# Patient Record
Sex: Female | Born: 1945
Health system: Southern US, Community
[De-identification: ages and names within clinical notes are randomized; demographics above are authoritative.]

## PROBLEM LIST (undated history)

## (undated) DIAGNOSIS — T83022A Displacement of nephrostomy catheter, initial encounter: Secondary | ICD-10-CM

## (undated) DIAGNOSIS — C679 Malignant neoplasm of bladder, unspecified: Secondary | ICD-10-CM

## (undated) DIAGNOSIS — T8029XA Infection following other infusion, transfusion and therapeutic injection, initial encounter: Secondary | ICD-10-CM

## (undated) DIAGNOSIS — N1330 Unspecified hydronephrosis: Secondary | ICD-10-CM

## (undated) DIAGNOSIS — N135 Crossing vessel and stricture of ureter without hydronephrosis: Secondary | ICD-10-CM

## (undated) DIAGNOSIS — Z01818 Encounter for other preprocedural examination: Secondary | ICD-10-CM

## (undated) DIAGNOSIS — L0291 Cutaneous abscess, unspecified: Secondary | ICD-10-CM

## (undated) DIAGNOSIS — C675 Malignant neoplasm of bladder neck: Secondary | ICD-10-CM

## (undated) DIAGNOSIS — R569 Unspecified convulsions: Secondary | ICD-10-CM

## (undated) DIAGNOSIS — T783XXA Angioneurotic edema, initial encounter: Secondary | ICD-10-CM

## (undated) DIAGNOSIS — Z136 Encounter for screening for cardiovascular disorders: Secondary | ICD-10-CM

## (undated) DIAGNOSIS — F329 Major depressive disorder, single episode, unspecified: Secondary | ICD-10-CM

## (undated) DIAGNOSIS — F32A Depression, unspecified: Secondary | ICD-10-CM

## (undated) DIAGNOSIS — N83209 Unspecified ovarian cyst, unspecified side: Secondary | ICD-10-CM

## (undated) DIAGNOSIS — G4700 Insomnia, unspecified: Secondary | ICD-10-CM

## (undated) DIAGNOSIS — I639 Cerebral infarction, unspecified: Secondary | ICD-10-CM

## (undated) DIAGNOSIS — C8510 Unspecified B-cell lymphoma, unspecified site: Secondary | ICD-10-CM

## (undated) DIAGNOSIS — E785 Hyperlipidemia, unspecified: Secondary | ICD-10-CM

## (undated) DIAGNOSIS — R55 Syncope and collapse: Secondary | ICD-10-CM

## (undated) DIAGNOSIS — R32 Unspecified urinary incontinence: Secondary | ICD-10-CM

## (undated) DIAGNOSIS — I10 Essential (primary) hypertension: Secondary | ICD-10-CM

## (undated) DIAGNOSIS — R42 Dizziness and giddiness: Secondary | ICD-10-CM

## (undated) HISTORY — DX: Syncope and collapse: R55

## (undated) HISTORY — DX: Encounter for screening for cardiovascular disorders: Z13.6

## (undated) HISTORY — DX: Insomnia, unspecified: G47.00

## (undated) HISTORY — DX: Unspecified urinary incontinence: R32

## (undated) HISTORY — DX: Angioneurotic edema, initial encounter: T78.3XXA

## (undated) HISTORY — DX: Essential (primary) hypertension: I10

## (undated) HISTORY — PX: ABDOMINAL HYSTERECTOMY: SHX81

## (undated) HISTORY — DX: Hyperlipidemia, unspecified: E78.5

## (undated) HISTORY — DX: Cerebral infarction, unspecified: I63.9

## (undated) HISTORY — PX: VESICOVAGINAL FISTULA CLOSURE W/ TAH: SUR271

## (undated) HISTORY — DX: Unspecified ovarian cyst, unspecified side: N83.209

---

## 2011-02-23 DIAGNOSIS — Z136 Encounter for screening for cardiovascular disorders: Secondary | ICD-10-CM

## 2011-02-23 HISTORY — DX: Encounter for screening for cardiovascular disorders: Z13.6

## 2011-04-14 ENCOUNTER — Telehealth: Payer: Self-pay | Admitting: *Deleted

## 2011-04-14 NOTE — Telephone Encounter (Signed)
LMTCB

## 2011-04-14 NOTE — Telephone Encounter (Signed)
Pt c/o SOB x 1 year, Dr. Sherryll Burger is her PCP and she request to be seen by one of our providers. Pt transferred to [phone] to be scheduled for new pt consult. I asked pt is [place] or [place] better and she stated she can do either.

## 2011-04-24 ENCOUNTER — Encounter: Payer: Self-pay | Admitting: Critical Care Medicine

## 2011-04-24 ENCOUNTER — Telehealth: Payer: Self-pay | Admitting: Critical Care Medicine

## 2011-04-24 ENCOUNTER — Ambulatory Visit (INDEPENDENT_AMBULATORY_CARE_PROVIDER_SITE_OTHER): Payer: Medicare PPO | Admitting: Critical Care Medicine

## 2011-04-24 DIAGNOSIS — I10 Essential (primary) hypertension: Secondary | ICD-10-CM

## 2011-04-24 DIAGNOSIS — R0600 Dyspnea, unspecified: Secondary | ICD-10-CM | POA: Insufficient documentation

## 2011-04-24 DIAGNOSIS — R55 Syncope and collapse: Secondary | ICD-10-CM | POA: Insufficient documentation

## 2011-04-24 DIAGNOSIS — R0989 Other specified symptoms and signs involving the circulatory and respiratory systems: Secondary | ICD-10-CM

## 2011-04-24 NOTE — Progress Notes (Signed)
Subjective:    Patient ID: Molly Patel, female    DOB: 1946-05-08, 65 y.o.   MRN: [id number]  HPI Comments: Main issue is dyspnea with exertion and syncope,  No cough   Shortness of Breath This is a chronic problem. The current episode started more than 1 year ago (Started end of August 2011). The problem occurs every several days (worse with heat, exertion or standing only). The problem has been gradually worsening. Associated symptoms include leg swelling, orthopnea and syncope. Pertinent negatives include no abdominal pain, chest pain, ear pain, fever, headaches, hemoptysis, leg pain, neck pain, PND, rash, rhinorrhea, sore throat, sputum production, vomiting or wheezing. The symptoms are aggravated by weather changes, any activity and lying flat (weather: esp hot days). Associated symptoms comments: Dizziness, episodes of syncope three different times.. She has tried nothing for the symptoms. There is no history of allergies, aspirin allergies, asthma, bronchiolitis, CAD, chronic lung disease, COPD, DVT, a heart failure, PE or pneumonia.   65 y.o.WF referred for dyspnea  Past Medical History  Diagnosis Date  . Hypertension      Family History  Problem Relation Age of Onset  . Asthma Grandchild   . Arthritis Sister   . Colon cancer Brother      History   Social History  . Marital Status: Divorced    Spouse Name: N/A    Number of Children: 2  . Years of Education: N/A   Occupational History  . Retired     [employer]   Social History Main Topics  . Smoking status: Former Smoker -- 7 years    Types: Cigarettes    Quit date: 08/25/1985  . Smokeless tobacco: Never Used  . Alcohol Use: No  . Drug Use: No  . Sexually Active: Not on file   Other Topics Concern  . Not on file   Social History Narrative  . No narrative on file     No Known Allergies   No outpatient prescriptions prior to visit.       Review of Systems  Constitutional: Positive for  activity change. Negative for fever, chills, diaphoresis, appetite change, fatigue and unexpected weight change.  HENT: Negative for hearing loss, ear pain, nosebleeds, congestion, sore throat, facial swelling, rhinorrhea, sneezing, mouth sores, trouble swallowing, neck pain, neck stiffness, dental problem, voice change, postnasal drip, sinus pressure, tinnitus and ear discharge.   Eyes: Positive for itching. Negative for photophobia, discharge and visual disturbance.  Respiratory: Positive for shortness of breath. Negative for apnea, cough, hemoptysis, sputum production, choking, chest tightness, wheezing and stridor.   Cardiovascular: Positive for orthopnea, leg swelling and syncope. Negative for chest pain, palpitations and PND.  Gastrointestinal: Negative for nausea, vomiting, abdominal pain, constipation, blood in stool and abdominal distention.  Genitourinary: Negative for dysuria, urgency, frequency, hematuria, flank pain, decreased urine volume and difficulty urinating.  Musculoskeletal: Positive for myalgias and gait problem. Negative for back pain, joint swelling and arthralgias.  Skin: Positive for pallor. Negative for color change and rash.  Neurological: Positive for syncope and light-headedness. Negative for dizziness, tremors, seizures, speech difficulty, weakness, numbness and headaches.  Hematological: Negative for adenopathy. Does not bruise/bleed easily.  Psychiatric/Behavioral: Positive for confusion and sleep disturbance. Negative for agitation. The patient is not nervous/anxious.        Objective:   Physical Exam  Filed Vitals:   04/24/11 1149  BP: 132/80  Pulse: 80  Temp: 97.7 F (36.5 C)  TempSrc: Oral  Height: 5' 6.5" (1.689  m)  Weight: 201 lb (91.173 kg)  SpO2: 98%    Gen: Pleasant, well-nourished, in no distress,  normal affect  ENT: No lesions,  mouth clear,  oropharynx clear, no postnasal drip  Neck: No JVD, no TMG, no carotid bruits  Lungs: No use of  accessory muscles, no dullness to percussion, clear without rales or rhonchi  Cardiovascular: RRR, heart sounds normal, no murmur or gallops, no peripheral edema  Abdomen: soft and NT, no HSM,  BS normal  Musculoskeletal: No deformities, no cyanosis or clubbing  Neuro: alert, non focal  Skin: Warm, no lesions or rashes   Recent Labs 6/12: Cr 0.78  K 3.9  TSH 3.74 LFTs normal Hgb 13  WBC 5.8    Assessment & Plan:   Dyspnea Dyspnea ? Unclear etiology.   No recent CXR or PFTs Plan Obtain CXR(pt left without this, will schedule) Full PFTs with 6 min walk No med changes for now Need complete herbal medicine list     Updated Medication List Outpatient Encounter Prescriptions as of 04/24/2011  Medication Sig Dispense Refill  . atenolol (TENORMIN) 50 MG tablet Take 1 tablet by mouth Daily.      . calcium-vitamin D (OSCAL WITH D) 250-125 MG-UNIT per tablet Take 2 tablets by mouth daily.        Marland Kitchen Fesoterodine Fumarate (TOVIAZ) 8 MG TB24 Take 1 tablet by mouth daily.        . hydrochlorothiazide 25 MG tablet Take 25 mg by mouth daily.        . Multiple Vitamin (MULTIVITAMIN) tablet Take 1 tablet by mouth daily.        . traZODone (DESYREL) 50 MG tablet Take 50 mg by mouth at bedtime.        . vitamin B-12 (CYANOCOBALAMIN) 100 MCG tablet Take 100 mcg by mouth daily.        . Vitamin D, Ergocalciferol, (DRISDOL) 50000 UNITS CAPS Take 50,000 Units by mouth every 7 (seven) days.

## 2011-04-24 NOTE — Patient Instructions (Signed)
Call back with your list of supplements Will obtain records from Dr Sherryll Burger No medication changes Full pulmonary function tests , and 6 minute walk  Return 3 weeks , I will call with results

## 2011-04-24 NOTE — Assessment & Plan Note (Signed)
Dyspnea ? Unclear etiology.   No recent CXR or PFTs Plan Obtain CXR(pt left without this, will schedule) Full PFTs with 6 min walk No med changes for now Need complete herbal medicine list

## 2011-04-24 NOTE — Telephone Encounter (Signed)
Pt called to give an update on her otc meds. These meds have been added to the list

## 2011-04-25 ENCOUNTER — Telehealth: Payer: Self-pay | Admitting: *Deleted

## 2011-04-25 DIAGNOSIS — R0600 Dyspnea, unspecified: Secondary | ICD-10-CM

## 2011-04-25 NOTE — Telephone Encounter (Signed)
Message copied by Gweneth Dimitri D on Fri Apr 25, 2011 12:14 PM ------      Message from: Shan Levans E      Created: Thu Apr 24, 2011  2:51 PM       When this pt comes for pfts, she needs a cxr.  No record of pt ever having a cxr

## 2011-04-25 NOTE — Telephone Encounter (Signed)
Called, spoke with pt.  She is aware she will need to have CXR on Sept 11 when she is at the [place] office for PFT and SMW.  She verbalized understanding of this.  Order has been placed.  I also included a reminder in both the PFT and SMW appt to remind pt to go down for the CXR.

## 2011-05-06 ENCOUNTER — Ambulatory Visit (INDEPENDENT_AMBULATORY_CARE_PROVIDER_SITE_OTHER): Payer: Medicare PPO | Admitting: Critical Care Medicine

## 2011-05-06 ENCOUNTER — Ambulatory Visit (INDEPENDENT_AMBULATORY_CARE_PROVIDER_SITE_OTHER)
Admission: RE | Admit: 2011-05-06 | Discharge: 2011-05-06 | Disposition: A | Discharge status: Home / Self Care | Payer: Medicare PPO | Source: Ambulatory Visit | Attending: Critical Care Medicine | Admitting: Critical Care Medicine

## 2011-05-06 DIAGNOSIS — R0609 Other forms of dyspnea: Secondary | ICD-10-CM

## 2011-05-06 DIAGNOSIS — R0600 Dyspnea, unspecified: Secondary | ICD-10-CM

## 2011-05-06 DIAGNOSIS — R0989 Other specified symptoms and signs involving the circulatory and respiratory systems: Secondary | ICD-10-CM

## 2011-05-06 LAB — PULMONARY FUNCTION TEST

## 2011-05-06 NOTE — Progress Notes (Signed)
PFT done today. 

## 2011-05-15 ENCOUNTER — Encounter: Payer: Self-pay | Admitting: Critical Care Medicine

## 2011-05-15 ENCOUNTER — Ambulatory Visit (INDEPENDENT_AMBULATORY_CARE_PROVIDER_SITE_OTHER): Payer: Medicare PPO | Admitting: Critical Care Medicine

## 2011-05-15 VITALS — BP 130/70 | HR 68 | Temp 97.7°F | Ht 66.5 in | Wt 202.5 lb

## 2011-05-15 DIAGNOSIS — R0609 Other forms of dyspnea: Secondary | ICD-10-CM

## 2011-05-15 DIAGNOSIS — R0600 Dyspnea, unspecified: Secondary | ICD-10-CM

## 2011-05-15 NOTE — Assessment & Plan Note (Signed)
Normal PFT Normal CXR Echo : NL LVEF Dyspnea likely on basis of deconditioning  Plan No further pulmonary workup or treatment Return PRN

## 2011-05-15 NOTE — Progress Notes (Unsigned)
PFTs from 05/06/2011 entered.

## 2011-05-15 NOTE — Patient Instructions (Signed)
No change in medications. Return as needed 

## 2011-05-15 NOTE — Progress Notes (Signed)
Subjective:    Patient ID: Molly Patel, female    DOB: [birth year], 65 y.o.   MRN: [id number]  HPI Comments: Main issue is dyspnea with exertion and syncope,  No cough   Shortness of Breath This is a chronic problem. The current episode started more than 1 year ago (Started end of August 2011). The problem occurs rarely (worse with heat, exertion or standing only). The problem has been rapidly improving. Pertinent negatives include no abdominal pain, chest pain, ear pain, fever, headaches, hemoptysis, leg pain, leg swelling, neck pain, orthopnea, PND, rash, rhinorrhea, sore throat, sputum production, syncope, vomiting or wheezing. The symptoms are aggravated by weather changes, any activity and lying flat (weather: esp hot days). She has tried nothing for the symptoms. There is no history of allergies, aspirin allergies, asthma, bronchiolitis, CAD, chronic lung disease, COPD, DVT, a heart failure, PE or pneumonia.   65 y.o.WF referred for dyspnea  05/15/2011 Patient has improved with less dyspnea since humidity and heat have decreased. Pulmonary functions are reviewed and are normal.  Chest x-ray is normal, echocardiogram is normal.  Past Medical History  Diagnosis Date  . Hypertension      Family History  Problem Relation Age of Onset  . Asthma Grandchild   . Arthritis Sister   . Colon cancer Brother      History   Social History  . Marital Status: Divorced    Spouse Name: N/A    Number of Children: 2  . Years of Education: N/A   Occupational History  . Retired     [employer]   Social History Main Topics  . Smoking status: Former Smoker -- 7 years    Types: Cigarettes    Quit date: 08/25/1985  . Smokeless tobacco: Never Used  . Alcohol Use: No  . Drug Use: No  . Sexually Active: Not on file   Other Topics Concern  . Not on file   Social History Narrative  . No narrative on file     No Known Allergies   Outpatient Prescriptions Prior to Visit    Medication Sig Dispense Refill  . atenolol (TENORMIN) 50 MG tablet Take 1 tablet by mouth Daily.      Marland Kitchen Fesoterodine Fumarate (TOVIAZ) 8 MG TB24 Take 1 tablet by mouth daily.        . hydrochlorothiazide 25 MG tablet Take 25 mg by mouth daily.        . calcium-vitamin D (OSCAL WITH D) 250-125 MG-UNIT per tablet Take 2 tablets by mouth daily.        . Cholecalciferol (VITAMIN D3) 1000 UNITS CAPS 3 times a week       . fish oil-omega-3 fatty acids 1000 MG capsule 2 tablets 3 times a week       . Multiple Vitamin (MULTIVITAMIN) tablet Take 1 tablet by mouth daily.        . Pyridoxine HCl (B-6 PO) 2 tablets a week       . traZODone (DESYREL) 50 MG tablet Take 50 mg by mouth at bedtime.        . vitamin B-12 (CYANOCOBALAMIN) 100 MCG tablet Take 100 mcg by mouth daily.        . vitamin C (ASCORBIC ACID) 500 MG tablet daily. 3 times a week            Review of Systems  Constitutional: Positive for activity change. Negative for fever, chills, diaphoresis, appetite change, fatigue and unexpected weight change.  HENT: Negative for hearing loss, ear pain, nosebleeds, congestion, sore throat, facial swelling, rhinorrhea, sneezing, mouth sores, trouble swallowing, neck pain, neck stiffness, dental problem, voice change, postnasal drip, sinus pressure, tinnitus and ear discharge.   Eyes: Positive for itching. Negative for photophobia, discharge and visual disturbance.  Respiratory: Positive for shortness of breath. Negative for apnea, cough, hemoptysis, sputum production, choking, chest tightness, wheezing and stridor.   Cardiovascular: Negative for chest pain, palpitations, orthopnea, leg swelling, syncope and PND.  Gastrointestinal: Negative for nausea, vomiting, abdominal pain, constipation, blood in stool and abdominal distention.  Genitourinary: Negative for dysuria, urgency, frequency, hematuria, flank pain, decreased urine volume and difficulty urinating.  Musculoskeletal: Positive for myalgias  and gait problem. Negative for back pain, joint swelling and arthralgias.  Skin: Positive for pallor. Negative for color change and rash.  Neurological: Positive for syncope and light-headedness. Negative for dizziness, tremors, seizures, speech difficulty, weakness, numbness and headaches.  Hematological: Negative for adenopathy. Does not bruise/bleed easily.  Psychiatric/Behavioral: Positive for confusion and sleep disturbance. Negative for agitation. The patient is not nervous/anxious.        Objective:   Physical Exam   Filed Vitals:   05/15/11 1147  BP: 130/70  Pulse: 68  Temp: 97.7 F (36.5 C)  TempSrc: Oral  Height: 5' 6.5" (1.689 m)  Weight: 202 lb 8 oz (91.853 kg)  SpO2: 99%    Gen: Pleasant, well-nourished, in no distress,  normal affect  ENT: No lesions,  mouth clear,  oropharynx clear, no postnasal drip  Neck: No JVD, no TMG, no carotid bruits  Lungs: No use of accessory muscles, no dullness to percussion, clear without rales or rhonchi  Cardiovascular: RRR, heart sounds normal, no murmur or gallops, no peripheral edema  Abdomen: soft and NT, no HSM,  BS normal  Musculoskeletal: No deformities, no cyanosis or clubbing  Neuro: alert, non focal  Skin: Warm, no lesions or rashes  8/31 CXR IMPRESSION:  No active cardiopulmonary disease.  9/11 6 min walk  98% RA Recent Labs 6/12: Cr 0.78  K 3.9  TSH 3.74 LFTs normal Hgb 13  WBC 5.8    Assessment & Plan:   Dyspnea Normal PFT Normal CXR Echo : NL LVEF Dyspnea likely on basis of deconditioning  Plan No further pulmonary workup or treatment Return PRN     I did discuss with the patient her elevated BMI of greater than 32 and suggested that she pursue a plan of weight loss with dietary change Updated Medication List Outpatient Encounter Prescriptions as of 05/15/2011  Medication Sig Dispense Refill  . atenolol (TENORMIN) 50 MG tablet Take 1 tablet by mouth Daily.      Marland Kitchen Fesoterodine  Fumarate (TOVIAZ) 8 MG TB24 Take 1 tablet by mouth daily.        . hydrochlorothiazide 25 MG tablet Take 25 mg by mouth daily.        Marland Kitchen DISCONTD: calcium-vitamin D (OSCAL WITH D) 250-125 MG-UNIT per tablet Take 2 tablets by mouth daily.        Marland Kitchen DISCONTD: Cholecalciferol (VITAMIN D3) 1000 UNITS CAPS 3 times a week       . DISCONTD: fish oil-omega-3 fatty acids 1000 MG capsule 2 tablets 3 times a week       . DISCONTD: Multiple Vitamin (MULTIVITAMIN) tablet Take 1 tablet by mouth daily.        Marland Kitchen DISCONTD: Pyridoxine HCl (B-6 PO) 2 tablets a week       . DISCONTD: traZODone (  DESYREL) 50 MG tablet Take 50 mg by mouth at bedtime.        Marland Kitchen DISCONTD: vitamin B-12 (CYANOCOBALAMIN) 100 MCG tablet Take 100 mcg by mouth daily.        Marland Kitchen DISCONTD: vitamin C (ASCORBIC ACID) 500 MG tablet daily. 3 times a week

## 2011-09-05 ENCOUNTER — Encounter: Payer: Self-pay | Admitting: Family Medicine

## 2011-09-05 ENCOUNTER — Ambulatory Visit (INDEPENDENT_AMBULATORY_CARE_PROVIDER_SITE_OTHER): Payer: Medicare PPO | Admitting: Family Medicine

## 2011-09-05 VITALS — BP 142/78 | HR 74 | Temp 97.2°F | Resp 16 | Ht 67.0 in | Wt 207.0 lb

## 2011-09-05 DIAGNOSIS — R609 Edema, unspecified: Secondary | ICD-10-CM

## 2011-09-05 DIAGNOSIS — E669 Obesity, unspecified: Secondary | ICD-10-CM

## 2011-09-05 DIAGNOSIS — I10 Essential (primary) hypertension: Secondary | ICD-10-CM

## 2011-09-05 DIAGNOSIS — R600 Localized edema: Secondary | ICD-10-CM | POA: Insufficient documentation

## 2011-09-05 DIAGNOSIS — R42 Dizziness and giddiness: Secondary | ICD-10-CM | POA: Insufficient documentation

## 2011-09-05 MED ORDER — ATENOLOL 50 MG PO TABS: 50.0000 mg | ORAL_TABLET | Freq: Every day | ORAL | Status: DC

## 2011-09-05 MED ORDER — HYDROCHLOROTHIAZIDE 25 MG PO TABS: 25.0000 mg | ORAL_TABLET | Freq: Every day | ORAL | Status: DC

## 2011-09-05 NOTE — Progress Notes (Signed)
  Subjective:    Patient ID: Molly Patel, female    DOB: 1946-06-15, 66 y.o.   MRN: [id number]  HPI  Dr. Sherryll Burger- previous PCP Patient here to establish care   HTN- long standing history, takes HCTZ, Atenolol. Recently had labs performed on her previous PCP at the [facility].   SOB- patient seen and evaluated for shortness of breath. Has had pulmonary function tests which were normal according to the chart. Pulmonology note reviewed. She feels her breathing is much better at this time.  Dizziness- patient has been seen and had workup for dizziness as well. She does not know the diagnosis and unclear of specific separate testing. She does not have her records with her today.   Leg swelling- history of LE edema, currently on HCTZ.  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 Jenelle Mages [facility]- Dr. Mayford Knife- overdue  Review of Systems   GEN- denies fatigue, fever, weight loss,weakness, recent illness HEENT- denies eye drainage, change in vision, nasal discharge, CVS- denies chest pain, palpitations RESP- denies SOB, cough, wheeze ABD- denies N/V, change in stools, abd pain,+constipation GU- denies dysuria, hematuria, dribbling, +incontinence MSK-+joint pain, muscle aches, injury Neuro- denies headache, syncope, seizure activity      Objective:   Physical Exam GEN- NAD, alert and oriented x3 HEENT- PERRL, EOMI, non injected sclera, pink conjunctiva, MMM, oropharynx clear, fair dentition Neck- Supple, no thryomegaly, no carotid bruit CVS- RRR, no murmur RESP-CTAB ABD- NABS, soft, NT, ND EXT- pedal edema Pulses- Radial, DP- 2+        Assessment & Plan:

## 2011-09-05 NOTE — Assessment & Plan Note (Signed)
Encouraged weight loss and exercise.  

## 2011-09-05 NOTE — Patient Instructions (Signed)
Start taking Calcium ( 1200mg ) and Vit D (800IU) Continue your blood pressure medications Take your Baby Aspirin daily Take the multivitamin daily   We will get the records from your previous doctors  Next visit in 4 weeks

## 2011-09-05 NOTE — Assessment & Plan Note (Signed)
Slightly elevated blood pressure today. I will obtain her records. She will continue her current medications.

## 2011-09-05 NOTE — Assessment & Plan Note (Signed)
Lower extremity edema, likely secondary to venous stasis. Patient will continue HCTZ at this time. Will follow up to see she has compression hose if this gets worse

## 2011-09-05 NOTE — Assessment & Plan Note (Signed)
Patient evidently has had extensive workup for this. Review the chart also shows she has had syncope in the past.: I need to take a closer look into her medical records as she cannot explain any test results for the specialist she has seen.

## 2011-09-21 ENCOUNTER — Encounter: Payer: Self-pay | Admitting: Family Medicine

## 2011-09-22 ENCOUNTER — Encounter: Payer: Self-pay | Admitting: Family Medicine

## 2011-10-06 ENCOUNTER — Encounter: Payer: Self-pay | Admitting: Family Medicine

## 2011-10-06 ENCOUNTER — Ambulatory Visit (INDEPENDENT_AMBULATORY_CARE_PROVIDER_SITE_OTHER): Payer: Medicare PPO | Admitting: Family Medicine

## 2011-10-06 VITALS — BP 130/72 | HR 71 | Resp 18 | Ht 67.0 in | Wt 209.1 lb

## 2011-10-06 DIAGNOSIS — R609 Edema, unspecified: Secondary | ICD-10-CM

## 2011-10-06 DIAGNOSIS — I10 Essential (primary) hypertension: Secondary | ICD-10-CM

## 2011-10-06 DIAGNOSIS — E669 Obesity, unspecified: Secondary | ICD-10-CM

## 2011-10-06 DIAGNOSIS — M25569 Pain in unspecified knee: Secondary | ICD-10-CM

## 2011-10-06 NOTE — Assessment & Plan Note (Signed)
Edema improved per pt, continue thiazide diuretic

## 2011-10-06 NOTE — Assessment & Plan Note (Signed)
Blood pressure improved, continue BB and HCTZ. Reviewed last set of labs, will repeat at next visit

## 2011-10-06 NOTE — Assessment & Plan Note (Signed)
Continue to encourage exercise and conditoning

## 2011-10-06 NOTE — Assessment & Plan Note (Signed)
Anterior knee pain, no red flags, suggest OA, pt not having pain and wants to wait on any intervention. If this continues to bother her, obtain x-ray, told to use Tylenol prn

## 2011-10-06 NOTE — Progress Notes (Signed)
  Subjective:    Patient ID: Molly Patel, female    DOB: [birth year], 66 y.o.   MRN: [id number]  HPI Patient here to followup on hypertension. Has been tolerating her HCTZ and atenolol. She asked about switching him to the morning because he keeps her up at night. Denies any chest pain, shortness of breath. She did note she had flulike symptoms a few weeks ago but self-medicating at home and is doing well.  Knee weakness- patient states she's had knee weakness since she's had the flu. She denies any actual pain but feels a week in the anterior knee. She denies swelling, locking, catching. No previous history of knee pain, no recent fall or injury. She has not had to take any medication for this.  Leg edema- she feels her swelling has improved.  Review of Systems   GEN- denies fatigue, fever, weight loss,weakness, recent illness HEENT- denies eye drainage, change in vision, nasal discharge, CVS- denies chest pain, palpitations RESP- denies SOB, cough, wheeze MSK-+joint pain, muscle aches, injury Neuro- denies headache, syncope, seizure activity     Objective:   Physical Exam GEN- NAD, alert and oriented x3 CVS- RRR, no murmur RESP-CTAB EXT- pedal edema Pulses- Radial, DP- 2+ MSK- normal ROM bilateral knee, no deformity on inspection, no effusion, minimal ttp anterior knee, ligaments in tact       Assessment & Plan:   Pt declines immunizations

## 2011-10-06 NOTE — Patient Instructions (Signed)
Next visit in 5 months for routine physical Please set her up for a morning appointment Do not eat after midnight before visit Continue your current medications If your knees continue to bother you please call and we will get an x-ray

## 2011-11-24 LAB — CBC W/O DIFF
HCT: 42.8 % (ref 35.8–46.3)
HGB: 13.8 g/dL (ref 11.7–15.4)
MCH: 29.2 PG (ref 26.1–32.9)
MCHC: 32.2 g/dL (ref 31.4–35.0)
MCV: 90.5 FL (ref 79.6–97.8)
MPV: 10.4 FL — ABNORMAL LOW (ref 10.8–14.1)
PLATELET: 275 10*3/uL (ref 150–450)
RBC: 4.73 M/uL (ref 4.05–5.25)
RDW: 14.9 % — ABNORMAL HIGH (ref 11.9–14.6)
WBC: 8 10*3/uL (ref 4.3–11.1)

## 2011-11-24 LAB — URINALYSIS W/O MICRO
Bilirubin: NEGATIVE
Glucose: NEGATIVE MG/DL
Ketone: NEGATIVE MG/DL
Leukocyte Esterase: NEGATIVE
Nitrites: NEGATIVE
Specific gravity: 1.015 (ref 1.001–1.023)
Urobilinogen: 0.2 EU/DL (ref 0.2–1.0)
pH (UA): 7 (ref 5.0–9.0)

## 2011-11-24 LAB — METABOLIC PANEL, BASIC
Anion gap: 8 mmol/L (ref 7–16)
BUN: 18 MG/DL (ref 8–23)
CO2: 30 MMOL/L (ref 21–32)
Calcium: 8.9 MG/DL (ref 8.3–10.4)
Chloride: 100 MMOL/L (ref 98–107)
Creatinine: 0.65 MG/DL (ref 0.6–1.0)
GFR est AA: >60 mL/min/{1.73_m2} (ref >60)
GFR est non-AA: >60 mL/min/{1.73_m2} (ref >60)
Glucose: 111 MG/DL — ABNORMAL HIGH (ref 65–100)
Potassium: 4.2 MMOL/L (ref 3.5–5.1)
Sodium: 138 MMOL/L (ref 136–145)

## 2011-11-24 LAB — URINE MICROSCOPIC
Casts: 0 /LPF
Crystals, urine: 0 /LPF

## 2011-11-24 NOTE — Other (Signed)
Pt verbalizes understanding of all instructions, including NPO after midnight the day before, place, medications to take DOS: neurontin, ultram prn, pyridium prn, thyroid, and to continue regular medications up until DOS.  Pt was informed they will be notified of the time of arrival the afternoon prior to their surgery date.  Teaching sheets given and reviewed on blood administration, pain management, smoking cessation, importance of hand hygiene & preventing infection at home.   Pt knows to have ride present during surgery and to check home phone for any changes in time of arrival night prior to surgery.       OR desk phone number given to pt prn need to cancel close to surgery time or needs to call prn if no one calls them by 1700 on the day prior to surgery.      Pt to stop these medications:mobic, ASA/excedrin    Pt was given the phone number to call prn any safety concerns during their hospital stay.    Pt to wash with antibacterial soap on day of surgery since Hibiclens is not to be used on their surgery site.      Type  1B  - LABS NEEDED: CBC, BMP; UA U C&S per surgeon

## 2011-11-25 NOTE — Other (Signed)
Faxed abnormal UA 1+ bacteria and pending urine culture to [facility] for Dr Sterrett to review---also left her a VM

## 2011-11-26 LAB — CULTURE, URINE: Culture result:: <10000

## 2011-11-27 MED ORDER — FENTANYL CITRATE (PF) 50 MCG/ML IJ SOLN
50 mcg/mL | INTRAMUSCULAR | Status: DC | PRN
Start: 2011-11-27 — End: 2011-11-27

## 2011-11-27 MED ORDER — HYDROMORPHONE 2 MG/ML INJECTION SOLUTION
2 mg/mL | INTRAMUSCULAR | Status: DC | PRN
Start: 2011-11-27 — End: 2011-11-27
  Administered 2011-11-27 (×2): via INTRAVENOUS

## 2011-11-27 MED ORDER — CIPROFLOXACIN IN D5W 400 MG/200 ML IV PIGGY BACK
400 mg/200 mL | Freq: Once | INTRAVENOUS | Status: AC
Start: 2011-11-27 — End: 2011-11-27
  Administered 2011-11-27: 14:00:00 via INTRAVENOUS

## 2011-11-27 MED ORDER — ONDANSETRON (PF) 4 MG/2 ML INJECTION
4 mg/2 mL | Freq: Once | INTRAMUSCULAR | Status: DC
Start: 2011-11-27 — End: 2011-11-27

## 2011-11-27 MED ORDER — NALBUPHINE 10 MG/ML INJECTION
10 mg/mL | INTRAMUSCULAR | Status: DC | PRN
Start: 2011-11-27 — End: 2011-11-27

## 2011-11-27 MED ORDER — MIDAZOLAM 1 MG/ML IJ SOLN
1 mg/mL | Freq: Once | INTRAMUSCULAR | Status: DC
Start: 2011-11-27 — End: 2011-11-27

## 2011-11-27 MED ORDER — NALOXONE 0.4 MG/ML INJECTION
0.4 mg/mL | INTRAMUSCULAR | Status: DC | PRN
Start: 2011-11-27 — End: 2011-11-27

## 2011-11-27 MED ORDER — FAMOTIDINE 20 MG TAB
20 mg | Freq: Once | ORAL | Status: AC
Start: 2011-11-27 — End: 2011-11-27
  Administered 2011-11-27: 13:00:00 via ORAL

## 2011-11-27 MED ORDER — ACETAMINOPHEN 1,000 MG/100 ML (10 MG/ML) IV
1000 mg/100 mL (10 mg/mL) | Freq: Once | INTRAVENOUS | Status: AC
Start: 2011-11-27 — End: 2011-11-27
  Administered 2011-11-27: 15:00:00 via INTRAVENOUS

## 2011-11-27 MED ORDER — OXYCODONE 5 MG TAB
5 mg | Freq: Once | ORAL | Status: DC
Start: 2011-11-27 — End: 2011-11-27

## 2011-11-27 MED ORDER — FENTANYL CITRATE (PF) 50 MCG/ML IJ SOLN
50 mcg/mL | Freq: Once | INTRAMUSCULAR | Status: DC
Start: 2011-11-27 — End: 2011-11-27

## 2011-11-27 MED ORDER — LIDOCAINE HCL 1 % (10 MG/ML) IJ SOLN
10 mg/mL (1 %) | INTRAMUSCULAR | Status: DC | PRN
Start: 2011-11-27 — End: 2011-11-27

## 2011-11-27 MED ORDER — LACTATED RINGERS IV
INTRAVENOUS | Status: DC
Start: 2011-11-27 — End: 2011-11-27
  Administered 2011-11-27: 13:00:00 via INTRAVENOUS

## 2011-11-27 MED ORDER — LACTATED RINGERS IV
INTRAVENOUS | Status: DC
Start: 2011-11-27 — End: 2011-11-27

## 2011-11-27 MED ORDER — DIPHENHYDRAMINE HCL 50 MG/ML IJ SOLN
50 mg/mL | INTRAMUSCULAR | Status: DC | PRN
Start: 2011-11-27 — End: 2011-11-27

## 2011-11-27 MED ADMIN — morphine injection 2 mg: INTRAVENOUS | @ 18:00:00 | NDC 00409189001

## 2011-11-27 MED ADMIN — gabapentin (NEURONTIN) capsule 300 mg: ORAL | @ 21:00:00 | NDC 68084056311

## 2011-11-27 MED ADMIN — 0.9% sodium chloride infusion: INTRAVENOUS | @ 18:00:00 | NDC 00409798309

## 2011-11-27 MED ADMIN — docusate sodium (COLACE) capsule 100 mg: ORAL | @ 21:00:00 | NDC 62584068311

## 2011-11-27 MED FILL — NALBUPHINE 10 MG/ML INJECTION: 10 mg/mL | INTRAMUSCULAR | Qty: 1

## 2011-11-27 MED FILL — OFIRMEV 1,000 MG/100 ML (10 MG/ML) INTRAVENOUS SOLUTION: 1000 mg/100 mL (10 mg/mL) | INTRAVENOUS | Qty: 100

## 2011-11-27 MED FILL — FAMOTIDINE 20 MG TAB: 20 mg | ORAL | Qty: 1

## 2011-11-27 MED FILL — SODIUM CHLORIDE 0.9 % IV: INTRAVENOUS | Qty: 1000

## 2011-11-27 MED FILL — GABAPENTIN 300 MG CAP: 300 mg | ORAL | Qty: 1

## 2011-11-27 MED FILL — CLOBETASOL 0.05 % OINTMENT: 0.05 % | CUTANEOUS | Qty: 30

## 2011-11-27 MED FILL — HYDROMORPHONE 2 MG/ML INJECTION SOLUTION: 2 mg/mL | INTRAMUSCULAR | Qty: 1

## 2011-11-27 MED FILL — LACTATED RINGERS IV: INTRAVENOUS | Qty: 1000

## 2011-11-27 MED FILL — CIPRO 400 MG/200 ML IN DEXTROSE 5 % INTRAVENOUS PIGGYBACK: 400 mg/200 mL | INTRAVENOUS | Qty: 200

## 2011-11-27 MED FILL — NYSTATIN 100,000 UNIT/G TOPICAL CREAM: 100000 unit/gram | CUTANEOUS | Qty: 30

## 2011-11-27 MED FILL — MORPHINE 2 MG/ML INJECTION: 2 mg/mL | INTRAMUSCULAR | Qty: 1

## 2011-11-27 MED FILL — CONRAY 60 % INJECTION SOLUTION: 60 % | INTRAMUSCULAR | Qty: 50

## 2011-11-27 MED FILL — DOCUSATE SODIUM 100 MG CAP: 100 mg | ORAL | Qty: 1

## 2011-11-27 NOTE — Op Note (Signed)
[facility]                            [address]                           [place], [place]. [address]                                [phone]                                OPERATIVE REPORT    NAME:  Cathy Jackson, Cathy Jackson                            MR:  [id number]  LOC:  SO                    SEX:  F               ACCT:  [id number]  DOB:  01-May-1946            AGE:  66              PT:  S  ADMIT:  11/27/2011          DSCH:                 MSV:  SUR      PREPROCEDURE DIAGNOSIS: Abnormal bladder urothelium.    POSTPROCEDURE DIAGNOSIS: Abnormal bladder urothelium.    NAME OF PROCEDURE  1. Cystoscopy.  2. Bladder biopsies.  3. Fulguration of abnormal bladder urothelium.  4. Bilateral retrograde pyelograms.    SURGEON: Irma Newness, DO    ANESTHESIA: General.    CLINICAL HISTORY: This is a 66 year old female with a longstanding  history of irritative lower urinary tract symptoms. Recent cystoscopy  showed raised and injected urothelium over the posterior walls and dome  of the bladder. I recommended the above-mentioned procedure. All risks,  benefits, and alternatives to the procedure were discussed and she is  willing to proceed at this time.    DESCRIPTION OF OPERATIVE PROCEDURE: Patient consent was obtained. The  patient was brought back to the operating room at which time she was  placed in the supine position. After the uneventful induction of general  anesthesia she was then placed in a dorsal lithotomy position. Her  genital area was prepped and draped and a sterile field applied. A  22-French cystoscope was inserted into urethra and advanced into the  bladder under direct visualization. The bladder was extremely friable,  especially with higher bladder volumes. Several areas of raised  urothelium with injection were noted on the posterior wall, as well as  the dome near the anterior bladder neck. Both orifices were seen in their  normal orthotopic positions. I did obtain a  biopsy from the posterior  wall as well as the dome near the bladder neck. These areas were then  fulgurated using the Bugbee electrode. Due to diffuse bladder bleeding I  then exchanged the cystoscope out for a 26 French resectoscope and began  fulgurating any obvious bleeding on the bladder. Following hemostasis  bilateral retrograde pyelograms were performed using an 8-French  cone-tipped catheter. The left side appeared normal with no filling  defects or dilation. The right side did show some mild ureteral dilation  but no obvious filling defects or obstruction. She did appear to have a  mild right UPJ obstruction with mild right hydronephrosis, however no  obvious filling defects could be seen. At this point a 22-French 3-way  catheter was placed to continuous bladder irrigation. The patient  tolerated the procedure well. I anticipate her recovery in the hospital  at least overnight.    INTERPRETATION OF BILATERAL RETROGRADE PYELOGRAMS: Bilateral retrograde  pyelograms were performed using an 8-French cone-tipped catheter. The  left side appeared normal. No filling defects or other abnormalities were  seen. The right side did show some mild ureteral dilation throughout as  well as a likely right partial UPJ obstruction with mild right  hydronephrosis. Excretion of contrast was noted promptly on the left and  excretion of contrast was noted on the right with some delay.                Shelma Eiben P. Aaryanna Hyden, DO        A                This is an unverified document unless signed by physician.    TID:  wmx                                      DT:  11/27/2011  1:12 P  JOB:  [id number]        DOC#:  [id number]           DD:  11/27/2011    cc:   Remi Deter P. Tericka Devincenzi, DO

## 2011-11-27 NOTE — Brief Op Note (Signed)
BRIEF OPERATIVE NOTE    Date of Procedure: 11/27/2011   Preoperative Diagnosis: Abnl bladder urothelium  Postoperative Diagnosis: Same   Procedure: Procedure(s):  CYSTOSCOPY WITH BIOPSIES/FULGERATION OF BLADDER  CYSTOSCOPY WITH BILATERAL  RETROGRADE PYLOGRAMS  Surgeon(s) and Role:     * Lyndle Herrlich Brice Kossman, DO - Primary  Anesthesia: General   Estimated Blood Loss: 10cc  Specimens:   ID Type Source Tests Collected by Time Destination   1 : BLADDER POSTERIOR WALL BIOPSY Preservative Biopsy  Lyndle Herrlich Ellie Bryand, DO 11/27/2011 1100 Pathology   2 : BLADDER DOME BIOSPY  Preservative Bladder  Lyndle Herrlich Trevaris Pennella, DO 11/27/2011 1104 Pathology      Findings: see op note  Complications: None  Implants: * No implants in log *

## 2011-11-27 NOTE — Progress Notes (Signed)
TRANSFER - IN REPORT:    Verbal report received from [facility], RN(name) on Cathy Jackson  being received from OP PACU(unit) for routine progression of care      Report consisted of patient???s Situation, Background, Assessment and   Recommendations(SBAR).     Information from the following report(s) SBAR was reviewed with the receiving nurse.    Opportunity for questions and clarification was provided.      Assessment completed upon patient???s arrival to unit and care assumed.

## 2011-11-27 NOTE — Progress Notes (Signed)
BP 143/67   Pulse 90   Temp 98.2 ??F (36.8 ??C)   Resp 16   Ht 5\' 1"  (1.549 m)   Wt 68.493 kg (151 lb)   BMI 28.53 kg/m2   SpO2 98%, pain well controlled, airway patent, patient appropriately hydrated and appears euvolemic, Alert and oriented,  no nausea,  follow up per surgeon, no anesthetic complications, Transfer to floor.

## 2011-11-27 NOTE — Other (Signed)
Report given to rose baxter rn

## 2011-11-27 NOTE — Other (Signed)
TRANSFER - OUT REPORT:    Verbal report given to Midge Minium on Sundy Houchins  being transferred to room608 for routine progression of care       Report consisted of patient???s Situation, Background, Assessment and   Recommendations(SBAR).     Information from the following report(s) SBAR, OR Summary, Intake/Output and MAR was reviewed with the receiving nurse.    Opportunity for questions and clarification was provided.

## 2011-11-28 MED ORDER — HYDROCODONE-ACETAMINOPHEN 10 MG-325 MG TAB
10-325 mg | ORAL_TABLET | ORAL | Status: DC | PRN
Start: 2011-11-28 — End: 2012-03-26

## 2011-11-28 MED ADMIN — morphine injection 2 mg: INTRAVENOUS | NDC 00409189001

## 2011-11-28 MED ADMIN — cephALEXin (KEFLEX) capsule 250 mg: ORAL | @ 02:00:00 | NDC 00143989801

## 2011-11-28 MED ADMIN — 0.9% sodium chloride infusion: INTRAVENOUS | @ 10:00:00 | NDC 00409798309

## 2011-11-28 MED ADMIN — levothyroxine (SYNTHROID) tablet 50 mcg: ORAL | @ 10:00:00 | NDC 00074455211

## 2011-11-28 MED ADMIN — 0.9% sodium chloride infusion: INTRAVENOUS | @ 02:00:00 | NDC 00409798309

## 2011-11-28 MED ADMIN — nystatin (MYCOSTATIN) 100,000 unit/g cream: TOPICAL | @ 13:00:00 | NDC 51672128902

## 2011-11-28 MED ADMIN — clobetasol (TEMOVATE) 0.05 % ointment: TOPICAL | @ 02:00:00 | NDC 51672125902

## 2011-11-28 MED ADMIN — gabapentin (NEURONTIN) capsule 300 mg: ORAL | @ 12:00:00 | NDC 68084056311

## 2011-11-28 MED ADMIN — docusate sodium (COLACE) capsule 100 mg: ORAL | @ 12:00:00 | NDC 62584068311

## 2011-11-28 MED ADMIN — clobetasol (TEMOVATE) 0.05 % ointment: TOPICAL | @ 12:00:00 | NDC 51672125902

## 2011-11-28 MED ADMIN — montelukast (SINGULAIR) tablet 10 mg: ORAL | @ 12:00:00 | NDC 68084062011

## 2011-11-28 MED ADMIN — cephALEXin (KEFLEX) capsule 250 mg: ORAL | @ 12:00:00 | NDC 00143989801

## 2011-11-28 MED FILL — CEPHALEXIN 250 MG CAP: 250 mg | ORAL | Qty: 1

## 2011-11-28 MED FILL — PROPOFOL 10 MG/ML IV EMUL: 10 mg/mL | INTRAVENOUS | Qty: 20

## 2011-11-28 MED FILL — ONDANSETRON (PF) 4 MG/2 ML INJECTION: 4 mg/2 mL | INTRAMUSCULAR | Qty: 2

## 2011-11-28 MED FILL — LEVOTHROID 50 MCG TABLET: 50 mcg | ORAL | Qty: 1

## 2011-11-28 MED FILL — FENTANYL CITRATE (PF) 50 MCG/ML IJ SOLN: 50 mcg/mL | INTRAMUSCULAR | Qty: 2

## 2011-11-28 MED FILL — DOCUSATE SODIUM 100 MG CAP: 100 mg | ORAL | Qty: 1

## 2011-11-28 MED FILL — LIDOCAINE (PF) 20 MG/ML (2 %) IV SYRINGE: 100 mg/5 mL (2 %) | INTRAVENOUS | Qty: 5

## 2011-11-28 MED FILL — GABAPENTIN 300 MG CAP: 300 mg | ORAL | Qty: 1

## 2011-11-28 MED FILL — SINGULAIR 10 MG TABLET: 10 mg | ORAL | Qty: 1

## 2011-11-28 MED FILL — MORPHINE 2 MG/ML INJECTION: 2 mg/mL | INTRAMUSCULAR | Qty: 1

## 2011-11-28 NOTE — Progress Notes (Signed)
Doing well.  Mild catheter discomfort.  AVSS  Abd soft, NT, ND.  UOP stable and clear.  S/P Bladder biopsies POD#1  Home after voiding trial.

## 2011-11-28 NOTE — Progress Notes (Signed)
Patient placed in wheelchair and taken to auto for discharge

## 2011-11-28 NOTE — Progress Notes (Signed)
CNA to remove foley catheter.  Discharge instructions, follow up appt, and prescriptions reviewed with pt.  Pt to leave after voiding trial.

## 2011-12-15 MED ORDER — SODIUM CHLORIDE 0.9% BOLUS IV
0.9 % | Freq: Once | INTRAVENOUS | Status: AC
Start: 2011-12-15 — End: 2011-12-15
  Administered 2011-12-15: 17:00:00 via INTRAVENOUS

## 2011-12-15 MED ORDER — IOVERSOL 350 MG/ML IV SOLN
350 mg iodine/mL | Freq: Once | INTRAVENOUS | Status: AC
Start: 2011-12-15 — End: 2011-12-15
  Administered 2011-12-15: 17:00:00 via INTRAVENOUS

## 2011-12-15 MED ORDER — SALINE PERIPHERAL FLUSH PRN
Freq: Once | INTRAMUSCULAR | Status: AC
Start: 2011-12-15 — End: 2011-12-15
  Administered 2011-12-15: 17:00:00

## 2012-01-21 ENCOUNTER — Telehealth: Payer: Self-pay | Admitting: Family Medicine

## 2012-01-21 DIAGNOSIS — E669 Obesity, unspecified: Secondary | ICD-10-CM

## 2012-01-21 DIAGNOSIS — I10 Essential (primary) hypertension: Secondary | ICD-10-CM

## 2012-01-21 NOTE — Telephone Encounter (Signed)
Please call and let pt know to get her labs done either tomorrow morning or Friday, order has been placed

## 2012-01-21 NOTE — Telephone Encounter (Signed)
Tried to call pt on both numbers provided but both mailboxes were full and could not accept anymore messages. Will go ahead and fax order to the lab

## 2012-01-23 ENCOUNTER — Encounter: Payer: Self-pay | Admitting: Family Medicine

## 2012-01-23 ENCOUNTER — Ambulatory Visit (INDEPENDENT_AMBULATORY_CARE_PROVIDER_SITE_OTHER): Payer: Medicare PPO | Admitting: Family Medicine

## 2012-01-23 ENCOUNTER — Other Ambulatory Visit (HOSPITAL_COMMUNITY)
Admission: RE | Admit: 2012-01-23 | Discharge: 2012-01-23 | Disposition: A | Discharge status: Home / Self Care | Payer: Medicare PPO | Source: Ambulatory Visit | Attending: Family Medicine | Admitting: Family Medicine

## 2012-01-23 VITALS — BP 122/78 | HR 71 | Resp 16 | Ht 67.0 in | Wt 201.1 lb

## 2012-01-23 DIAGNOSIS — R55 Syncope and collapse: Secondary | ICD-10-CM

## 2012-01-23 DIAGNOSIS — Z1239 Encounter for other screening for malignant neoplasm of breast: Secondary | ICD-10-CM

## 2012-01-23 DIAGNOSIS — E669 Obesity, unspecified: Secondary | ICD-10-CM

## 2012-01-23 DIAGNOSIS — I10 Essential (primary) hypertension: Secondary | ICD-10-CM

## 2012-01-23 DIAGNOSIS — Z1211 Encounter for screening for malignant neoplasm of colon: Secondary | ICD-10-CM

## 2012-01-23 DIAGNOSIS — Z124 Encounter for screening for malignant neoplasm of cervix: Secondary | ICD-10-CM | POA: Insufficient documentation

## 2012-01-23 DIAGNOSIS — R195 Other fecal abnormalities: Secondary | ICD-10-CM

## 2012-01-23 DIAGNOSIS — Z780 Asymptomatic menopausal state: Secondary | ICD-10-CM

## 2012-01-23 DIAGNOSIS — Z01419 Encounter for gynecological examination (general) (routine) without abnormal findings: Secondary | ICD-10-CM

## 2012-01-23 LAB — CBC
HCT: 43.2 % (ref 36.0–46.0)
Hemoglobin: 13.7 g/dL (ref 12.0–15.0)
MCV: 94.5 fL (ref 78.0–100.0)
RBC: 4.57 MIL/uL (ref 3.87–5.11)
WBC: 4.9 10*3/uL (ref 4.0–10.5)

## 2012-01-23 LAB — LIPID PANEL
Cholesterol: 245 mg/dL — ABNORMAL HIGH (ref 0–200)
HDL: 51 mg/dL (ref >39)
Total CHOL/HDL Ratio: 4.8 Ratio
VLDL: 45 mg/dL — ABNORMAL HIGH (ref 0–40)

## 2012-01-23 LAB — BASIC METABOLIC PANEL
Glucose, Bld: 97 mg/dL (ref 70–99)
Potassium: 4 mEq/L (ref 3.5–5.3)
Sodium: 141 mEq/L (ref 135–145)

## 2012-01-23 MED ORDER — ATENOLOL 50 MG PO TABS: 50.0000 mg | ORAL_TABLET | Freq: Every day | ORAL | Status: DC

## 2012-01-23 MED ORDER — HYDROCHLOROTHIAZIDE 25 MG PO TABS: 25.0000 mg | ORAL_TABLET | Freq: Every day | ORAL | Status: DC

## 2012-01-23 NOTE — Assessment & Plan Note (Signed)
Refer to GI for colonoscopy.

## 2012-01-23 NOTE — Assessment & Plan Note (Signed)
At goal, labs to be done

## 2012-01-23 NOTE — Assessment & Plan Note (Signed)
Congratulated on weight loss 

## 2012-01-23 NOTE — Progress Notes (Signed)
  Subjective:    Patient ID: Molly Patel, female    DOB: 1946/04/20, 66 y.o.   MRN: [id number]  HPI  Pt here for CPE and f/u blood pressure  Medications reviewed, she had 1 syncopal episode in the past few months   Doing well no concerns, [facility]   Review of Systems   GEN- denies fatigue, fever, weight loss,weakness, recent illness HEENT- denies eye drainage, change in vision, nasal discharge, CVS- denies chest pain, palpitations RESP- denies SOB, cough, wheeze ABD- denies N/V, change in stools, abd pain GU- denies dysuria, hematuria, dribbling, incontinence MSK- denies joint pain, muscle aches, injury Neuro- denies headache, dizziness, +syncope, seizure activity      Objective:   Physical Exam GEN- NAD, alert and oriented, HEENT-PERRL,EOMI, fundoscopic exman benign  CVA-RRR, no murmur RESP-CTAB ABD-NABS,soft, NT,ND Neck- supple, no thyromegaly Breast- normal symmetry, no nipple inversion,no nipple drainage, no nodules or lumps felt Nodes- no axillary nodes GU- normal external genitalia, vaginal mucosa pink and moist, small amount of what appear to be cervix visualized no growth, no blood form os, minimalwhite  discharge, , no ovarian masses,  Rectal- FOBT Positive, normal tone, no ext hemorroids  EXT- trace edema at ankles Pulse- DP, Radial 2+        Assessment & Plan:   CPE- PAP smear done, if normal no further PAP Smear needed Mammogram and Bone density  Declines immunizations

## 2012-01-23 NOTE — Patient Instructions (Addendum)
Please schedule Mammogram and Bone Density Test I will refer you for a Colonoscopy Schedule a visit with your eye doctor and dentist Medications refilled We will call with results F/U 4 months

## 2012-01-23 NOTE — Assessment & Plan Note (Signed)
Pt had another episode, but states she did not eat, she has had a work -up for this and she states she has also had a brain scan though I do not have record of this, she does not want any further work-up at this time

## 2012-01-23 NOTE — Progress Notes (Signed)
Addended by: Milinda Antis F on: 01/23/2012 11:03 AM   Modules accepted: Orders

## 2012-01-26 MED ORDER — ATORVASTATIN CALCIUM 20 MG PO TABS: 20.0000 mg | ORAL_TABLET | Freq: Every day | ORAL | Status: DC

## 2012-01-26 NOTE — Telephone Encounter (Signed)
Addended by: Milinda Antis F on: 01/26/2012 08:51 AM   Modules accepted: Orders

## 2012-02-03 ENCOUNTER — Encounter: Payer: Self-pay | Admitting: Gastroenterology

## 2012-02-12 ENCOUNTER — Ambulatory Visit: Payer: Medicare PPO | Admitting: Gastroenterology

## 2012-03-26 LAB — METABOLIC PANEL, BASIC
Anion gap: 7 mmol/L (ref 7–16)
BUN: 18 MG/DL (ref 8–23)
CO2: 30 MMOL/L (ref 21–32)
Calcium: 8.6 MG/DL (ref 8.3–10.4)
Chloride: 105 MMOL/L (ref 98–107)
Creatinine: 0.7 MG/DL (ref 0.6–1.0)
GFR est AA: >60 mL/min/{1.73_m2} (ref >60)
GFR est non-AA: >60 mL/min/{1.73_m2} (ref >60)
Glucose: 87 MG/DL (ref 65–100)
Potassium: 4.3 MMOL/L (ref 3.5–5.1)
Sodium: 142 MMOL/L (ref 136–145)

## 2012-03-26 LAB — URINALYSIS W/ REFLEX CULTURE
Bacteria: 0 /HPF
Bilirubin: NEGATIVE
Glucose: NEGATIVE MG/DL
Nitrites: NEGATIVE
Protein: 100 MG/DL — AB
RBC: >100 /HPF — ABNORMAL HIGH
Specific gravity: 1.023 (ref 1.001–1.023)
Urobilinogen: 0.2 EU/DL (ref 0.2–1.0)
pH (UA): 5.5 (ref 5.0–9.0)

## 2012-03-26 LAB — CBC W/O DIFF
HCT: 42.1 % (ref 35.8–46.3)
HGB: 13.6 g/dL (ref 11.7–15.4)
MCH: 28.5 PG (ref 26.1–32.9)
MCHC: 32.3 g/dL (ref 31.4–35.0)
MCV: 88.3 FL (ref 79.6–97.8)
MPV: 11.1 FL (ref 10.8–14.1)
PLATELET: 228 10*3/uL (ref 150–450)
RBC: 4.77 M/uL (ref 4.05–5.25)
RDW: 14.1 % (ref 11.9–14.6)
WBC: 8 10*3/uL (ref 4.3–11.1)

## 2012-03-26 NOTE — Other (Signed)
Patient's guide to surgery given along with Pt education sheets regarding transfusions, pain management,smoking,and hand hygiene for the family and community. Pt verbalizes understanding of all pre-op instructions . Reinforced nothing to eat or drink after midnight on the day prior to surgery , this includes gum, mints, ice chips or water. Instructed that family must be present in building at all times.    Instructed Patient that they will be notified the day prior to surgery ( or the Friday before if surgery is on a Monday) of their arrival time by pre-op nurse with  Verbal understanding. Pt aware to bathe or shower with antibacterial soap on the night before  And am of surgery.      One packet Hibiclens given to pt along with education sheet. Instructed pt to use on the am of surgery and  to avoid face and genitals with hibiclens. Instructed to wear freshly laundered clothes.     No audible murmur heard at pre-assessment.     Patient safety information sheet given per registration with numbers for patient relations: [phone] and patient safety [phone]. Instructed pt that for any family member to obtain information or updates on pt's condition that they must have 4 digit code provided to them at registration. Pt verbalized understanding.     Instructed patient to continue  previous medications as prescribed prior to surgery and  to take Keflex, Colace, Gabapentin, Synthroid, Tramadol and if needed Norco, Enablex, Pyridium  On the Day of surgery with sip of water.  Written Medication report given and copy to chart. Do not hold any medications that you have not been instructed to do either at your pre-assessment or per your surgeon.    Continue all previous medications unless otherwise directed.      Instructed patient to stop the following medications prior to surgery: Fish oil    TYPE  1B   CASE   Labs per surgeon :CBC,BMP, U/A,U/C  Labs per grid :no added  EKG  :   none

## 2012-03-29 NOTE — Other (Signed)
Faxed abnormal pending urine culture and abormal UA to [facility] at [facility] for Dr Rozann Lesches to review--also left Hope a VM

## 2012-03-30 LAB — CULTURE, URINE: Culture result:: 25000

## 2012-03-31 ENCOUNTER — Inpatient Hospital Stay: Payer: MEDICARE

## 2012-03-31 MED ADMIN — fentaNYL citrate (PF) injection: INTRAVENOUS | @ 20:00:00 | NDC 10019003867

## 2012-03-31 MED ADMIN — HYDROmorphone (DILAUDID) injection 0.5 mg: INTRAVENOUS | @ 21:00:00 | NDC 00641012121

## 2012-03-31 MED ADMIN — lidocaine (PF) (XYLOCAINE) 20 mg/mL (2 %) injection: INTRAVENOUS | @ 20:00:00 | NDC 00409428202

## 2012-03-31 MED ADMIN — dexamethasone (DECADRON) 4 mg/mL injection: INTRAVENOUS | @ 20:00:00 | NDC 63323016501

## 2012-03-31 MED ADMIN — acetaminophen (OFIRMEV) infusion: INTRAVENOUS | @ 20:00:00 | NDC 43825010201

## 2012-03-31 MED ADMIN — ondansetron (ZOFRAN) injection: INTRAVENOUS | @ 20:00:00 | NDC 00781301095

## 2012-03-31 MED ADMIN — lactated ringers infusion: INTRAVENOUS | @ 18:00:00 | NDC 00409795309

## 2012-03-31 MED ADMIN — propofol (DIPRIVAN) 10 mg/mL injection: INTRAVENOUS | @ 20:00:00 | NDC 63323027025

## 2012-03-31 MED ADMIN — 0.9% sodium chloride infusion: INTRAVENOUS | @ 23:00:00 | NDC 00409798309

## 2012-03-31 MED ADMIN — famotidine (PF) (PEPCID) injection 20 mg: INTRAVENOUS | @ 18:00:00 | NDC 00641602201

## 2012-03-31 MED ADMIN — HYDROcodone-acetaminophen (NORCO) 7.5-325 mg per tablet 1 Tab: ORAL | @ 23:00:00 | NDC 00406036662

## 2012-03-31 MED ADMIN — ciprofloxacin (CIPRO) 400 mg IVPB (premix): INTRAVENOUS | @ 19:00:00 | NDC 00085174102

## 2012-03-31 MED FILL — HYDROCODONE-ACETAMINOPHEN 7.5 MG-325 MG TAB: ORAL | Qty: 1

## 2012-03-31 MED FILL — OFIRMEV 1,000 MG/100 ML (10 MG/ML) INTRAVENOUS SOLUTION: 1000 mg/100 mL (10 mg/mL) | INTRAVENOUS | Qty: 100

## 2012-03-31 MED FILL — CIPRO 400 MG/200 ML IN DEXTROSE 5 % INTRAVENOUS PIGGYBACK: 400 mg/200 mL | INTRAVENOUS | Qty: 200

## 2012-03-31 MED FILL — FAMOTIDINE (PF) 20 MG/2 ML IV: 20 mg/2 mL | INTRAVENOUS | Qty: 2

## 2012-03-31 MED FILL — HYDROMORPHONE 2 MG/ML INJECTION SOLUTION: 2 mg/mL | INTRAMUSCULAR | Qty: 1

## 2012-03-31 MED FILL — SODIUM CHLORIDE 0.9 % IV: INTRAVENOUS | Qty: 1000

## 2012-03-31 MED FILL — CONRAY 60 % INJECTION SOLUTION: 60 % | INTRAMUSCULAR | Qty: 50

## 2012-03-31 NOTE — Progress Notes (Signed)
Dual skin assessment done. No breakdown noted.

## 2012-03-31 NOTE — Other (Signed)
TRANSFER - OUT REPORT:    Verbal report given to Cybil,RN on MIDGE MOMON  being transferred to 627or routine post - op       Report consisted of patient???s Situation, Background, Assessment and   Recommendations(SBAR).     Information from the following report(s) SBAR, OR Summary, Intake/Output and MAR was reviewed with the receiving nurse.    Opportunity for questions and clarification was provided.      VTE prophylaxis orders have been written for Lilia Pro.

## 2012-03-31 NOTE — Anesthesia Pre-Procedure Evaluation (Signed)
Anesthetic History   No history of anesthetic complications           Review of Systems / Medical History  Patient summary reviewed, nursing notes reviewed and pertinent labs reviewed    Pulmonary  Within defined limits               Neuro/Psych   Within defined limits           Cardiovascular  Within defined limits              Exercise tolerance: >4 METS     GI/Hepatic/Renal  Within defined limits                Endo/Other        Arthritis     Other Findings                        Physical Exam    Airway  Mallampati: I  TM Distance: 4 - 6 cm  Neck ROM: normal range of motion   Mouth opening: Normal     Cardiovascular  Regular rate and rhythm,  S1 and S2 normal,  no murmur, click, rub, or gallop  Rhythm: regular  Rate: normal         Dental    Dentition: Edentulous     Pulmonary  Breath sounds clear to auscultation               Abdominal  GI exam deferred       Other Findings                          Anesthetic Plan    ASA: 2  Anesthesia type: general          Induction: Intravenous  Anesthetic plan and risks discussed with: Patient and Spouse

## 2012-03-31 NOTE — Anesthesia Post-Procedure Evaluation (Signed)
Post-Anesthesia Evaluation and Assessment    Patient: Cathy Jackson MRN: [id number]  SSN: [id number]    Date of Birth: 02-Dec-1945  Age: 66 y.o.  Sex: female       Cardiovascular Function/Vital Signs  Visit Vitals   Item Reading   ??? BP 149/69   ??? Pulse 90   ??? Temp 36.4 ??C (97.6 ??F)   ??? Resp 15   ??? Ht 5\' 2"  (1.575 m)   ??? Wt 72.122 kg (159 lb)   ??? BMI 29.08 kg/m2   ??? SpO2 99%       Patient is status post General anesthesia for Procedure(s):  CYSTOSCOPY WITH BLADDER BIOPSIES.    Nausea/Vomiting: None    Postoperative hydration reviewed and adequate.    Pain:  Pain Scale 1: Numeric (0 - 10) (03/31/12 1707)  Pain Intensity 1: 6 (03/31/12 1707)   Managed    Neurological Status:   Neuro (WDL): Exceptions to WDL (03/31/12 1653)  Neuro  Neurologic State: Drowsy;Eyes open spontaneously (03/31/12 1653)  Orientation Level: Oriented X4 (03/31/12 1653)  Cognition: Follows commands (03/31/12 1653)  Speech: Clear (03/31/12 1653)   At baseline    Mental Status and Level of Consciousness: Alert and oriented to person, place, and time    Pulmonary Status:   O2 Device: Nasal cannula (03/31/12 1653)   Adequate oxygenation and airway patent    Complications related to anesthesia: None    Post-anesthesia assessment completed. No concerns    Signed By: Hal Neer, MD     March 31, 2012

## 2012-03-31 NOTE — Progress Notes (Signed)
Chaplain- Visited as requested, to offer spiritual support and prayer prior to surgery.  Pt's husband was present.     Howard Daniel Kirkpatrick, MDiv, BCC  Chaplain

## 2012-03-31 NOTE — Brief Op Note (Signed)
BRIEF OPERATIVE NOTE    Date of Procedure: 03/31/2012   Preoperative Diagnosis: BLADDER CANCER  Postoperative Diagnosis: BLADDER CANCER    Procedure: Procedure(s):  CYSTOSCOPY WITH BLADDER BIOPSIES  Surgeon(s) and Role:     * Lyndle Herrlich Lavida Patch, DO - Primary  Anesthesia: General   Estimated Blood Loss: 10cc  Specimens:   ID Type Source Tests Collected by Time Destination   1 : Posterior Wall Bladder Biopsy Preservative Bladder  Lyndle Herrlich Demon Volante, DO 03/31/2012 1554 Pathology   2 : Right Lateral Bladder Biopsy Preservative Bladder  Lyndle Herrlich Evalene Vath, DO 03/31/2012 1556 Pathology   3 : [facility] Biopsy Preservative Bladder  Lyndle Herrlich Jowan Skillin, DO 03/31/2012 1559 Pathology      Findings: see op note  Complications: None  Implants: * No implants in log *

## 2012-03-31 NOTE — Progress Notes (Signed)
TRANSFER - IN REPORT:    Verbal report received from [place] on Cathy Jackson  being received from PACU for routine progression of care      Report consisted of patient???s Situation, Background, Assessment and   Recommendations(SBAR).     Information from the following report(s) OR Summary, Intake/Output and MAR was reviewed with the receiving nurse.    Opportunity for questions and clarification was provided.      Awaiting arrival to floor

## 2012-04-01 MED ORDER — HYDROCODONE-ACETAMINOPHEN 7.5 MG-325 MG TAB
ORAL_TABLET | ORAL | Status: DC | PRN
Start: 2012-04-01 — End: 2012-09-03

## 2012-04-01 MED ADMIN — HYDROcodone-acetaminophen (NORCO) 7.5-325 mg per tablet 1 Tab: ORAL | @ 04:00:00 | NDC 00406036662

## 2012-04-01 MED ADMIN — nitrofurantoin (MACRODANTIN) capsule 50 mg: ORAL | @ 10:00:00 | NDC 51079058401

## 2012-04-01 MED ADMIN — 0.9% sodium chloride infusion: INTRAVENOUS | @ 10:00:00 | NDC 00409798309

## 2012-04-01 MED ADMIN — venlafaxine-SR (EFFEXOR-XR) capsule 150 mg: ORAL | @ 02:00:00 | NDC 68084048511

## 2012-04-01 MED ADMIN — nitrofurantoin (MACRODANTIN) capsule 50 mg: ORAL | @ 03:00:00 | NDC 51079058401

## 2012-04-01 MED ADMIN — docusate sodium (COLACE) capsule 100 mg: ORAL | @ 13:00:00 | NDC 62584068311

## 2012-04-01 MED ADMIN — levothyroxine (SYNTHROID) tablet 25 mcg: ORAL | @ 10:00:00 | NDC 00074455211

## 2012-04-01 MED FILL — OFIRMEV 1,000 MG/100 ML (10 MG/ML) INTRAVENOUS SOLUTION: 1000 mg/100 mL (10 mg/mL) | INTRAVENOUS | Qty: 100

## 2012-04-01 MED FILL — DOCUSATE SODIUM 100 MG CAP: 100 mg | ORAL | Qty: 1

## 2012-04-01 MED FILL — NITROFURANTOIN MACROCRYSTAL 50 MG CAP: 50 mg | ORAL | Qty: 1

## 2012-04-01 MED FILL — LIDOCAINE (PF) 20 MG/ML (2 %) IJ SOLN: 20 mg/mL (2 %) | INTRAMUSCULAR | Qty: 100

## 2012-04-01 MED FILL — DEXAMETHASONE SODIUM PHOSPHATE 4 MG/ML IJ SOLN: 4 mg/mL | INTRAMUSCULAR | Qty: 4

## 2012-04-01 MED FILL — LEVOTHROID 50 MCG TABLET: 50 mcg | ORAL | Qty: 1

## 2012-04-01 MED FILL — PROPOFOL 10 MG/ML IV EMUL: 10 mg/mL | INTRAVENOUS | Qty: 160

## 2012-04-01 MED FILL — VENLAFAXINE SR 75 MG 24 HR CAP: 75 mg | ORAL | Qty: 2

## 2012-04-01 MED FILL — ONDANSETRON (PF) 4 MG/2 ML INJECTION: 4 mg/2 mL | INTRAMUSCULAR | Qty: 4

## 2012-04-01 MED FILL — FENTANYL CITRATE (PF) 50 MCG/ML IJ SOLN: 50 mcg/mL | INTRAMUSCULAR | Qty: 100

## 2012-04-01 MED FILL — HYDROCODONE-ACETAMINOPHEN 7.5 MG-325 MG TAB: ORAL | Qty: 1

## 2012-04-01 NOTE — Progress Notes (Signed)
Pt's CBI has been draining patently pink to clear urine. Pt has had a concern of taking home the foley catheter but was re-assured that all her concerns will be answered in the morning. Pt understood. Needs met.

## 2012-04-01 NOTE — Progress Notes (Signed)
Discharge instructions, follow up appt, and prescription reviewed with pt and daughter.  Pt states she has voided a small amt but did not save.  Hat placed in toilet for pt to save.

## 2012-04-01 NOTE — Op Note (Signed)
[facility]                            [address]                           [place], [place]. [address]                                [phone]                                OPERATIVE REPORT    NAME:  Cathy Jackson, Cathy Jackson                          MR:  [id number]  LOC:  627 [id number]            SEX:  F               ACCT:  [id number]  DOB:  [birth year]            AGE:  66              PT:  V  ADMIT:  03/31/2012          DSCH:  04/01/2012     MSV:      PREPROCEDURE DIAGNOSIS: History of bladder cancer with irritative lower  urinary tract symptoms.    POSTPROCEDURE DIAGNOSIS: History of bladder cancer with irritative lower  urinary tract symptoms.    NAME OF PROCEDURE  1. Cystoscopy.  2. Bladder biopsies.    SURGEON: Carver Fila, DO.    ANESTHESIA: General.    CLINICAL HISTORY: This is a 66 year old female recently diagnosed with TA  grade 3 transitional cell carcinoma of the bladder on 11/27/2011.  Surveillance cystoscopy in the office showed significant abnormal  findings including diffuse raised urothelium. Cytology did show  cells, atypical, that were suspicious for cancer. I have recommended  repeat biopsies. All risks, benefits, and alternatives to the procedure  were discussed and she is willing to proceed at this time.    DESCRIPTION OF OPERATIVE PROCEDURE: The patient consent was obtained. The  patient was brought back to the operating room at which time she was  placed in the supine position. After the uneventful induction of general  anesthesia, she was then placed in a dorsal lithotomy position. Her  genital area was prepped and draped and a sterile field applied. A  22-French cystoscope was inserted into the urethra and advanced into the  bladder under direct visualization. The urethra was normal in appearance.  The bladder was systematically surveyed. There were several areas of  raised and injected urothelium especially over the posterior wall, right   lateral wall near the bladder neck and dome of the bladder near the  bladder neck. There was mild injection on the left lateral wall.  However, this was much milder when compared to the other areas. Biopsies  were obtained from the posterior bladder wall, right lateral wall, and  dome of the bladder. These areas were then fulgurated using the Bugbee  electrode. I attempted to fulgurate any abnormal appearing bladder mucosa  using the Bugbee electrode. Care was taken to avoid overdistention of the  bladder as her bladder did appear to be  extremely friable. Following  this, the cystoscope was removed. A 20-French 3-way catheter was placed  to continuous bladder irrigation. The patient tolerated the procedure  well and I anticipate her recovery in the hospital overnight.                Margherita Collyer P. Tyray Proch, DO        A                This is an unverified document unless signed by physician.    TID:  [id number]                                      DT:  04/01/2012  1:36 P  JOB:  [id number]        DOC#:  [id number]           DD:  03/31/2012    cc:   Remi Deter P. Moncia Annas, DO

## 2012-04-01 NOTE — Progress Notes (Signed)
Doing well.  No complaints.  AVSS  Abd soft, NT, ND.  UOP clear on min CBI  S/P bladder biopsies POD#1  Home after voiding trial.

## 2012-04-01 NOTE — Progress Notes (Signed)
Patient voided 200 cc clear yellow urine and was placed in a wheelchair and taken to her  Family auto for discharge

## 2012-04-29 ENCOUNTER — Other Ambulatory Visit: Payer: Self-pay

## 2012-04-29 ENCOUNTER — Telehealth: Payer: Self-pay | Admitting: Family Medicine

## 2012-04-29 MED ORDER — ATENOLOL 50 MG PO TABS: 50.0000 mg | ORAL_TABLET | Freq: Every day | ORAL | Status: DC

## 2012-04-29 MED ORDER — HYDROCHLOROTHIAZIDE 25 MG PO TABS: 25.0000 mg | ORAL_TABLET | Freq: Every day | ORAL | Status: DC

## 2012-04-29 NOTE — Telephone Encounter (Signed)
[facility]s [facility]

## 2012-05-03 ENCOUNTER — Telehealth: Payer: Self-pay | Admitting: Family Medicine

## 2012-05-05 NOTE — Telephone Encounter (Signed)
Appointment given for 9/20.  FYI

## 2012-05-05 NOTE — Telephone Encounter (Signed)
Error- please disregard

## 2012-05-05 NOTE — Telephone Encounter (Signed)
Please have her schedule an appt to discuss her back before a referral can be made

## 2012-05-14 ENCOUNTER — Encounter: Payer: Self-pay | Admitting: Family Medicine

## 2012-05-14 ENCOUNTER — Ambulatory Visit (INDEPENDENT_AMBULATORY_CARE_PROVIDER_SITE_OTHER): Payer: Medicare PPO | Admitting: Family Medicine

## 2012-05-14 VITALS — BP 128/80 | HR 88 | Resp 16 | Ht 67.0 in | Wt 194.1 lb

## 2012-05-14 DIAGNOSIS — R55 Syncope and collapse: Secondary | ICD-10-CM

## 2012-05-14 DIAGNOSIS — R29898 Other symptoms and signs involving the musculoskeletal system: Secondary | ICD-10-CM

## 2012-05-14 DIAGNOSIS — E785 Hyperlipidemia, unspecified: Secondary | ICD-10-CM

## 2012-05-14 DIAGNOSIS — R5381 Other malaise: Secondary | ICD-10-CM

## 2012-05-14 DIAGNOSIS — I10 Essential (primary) hypertension: Secondary | ICD-10-CM

## 2012-05-14 DIAGNOSIS — M48061 Spinal stenosis, lumbar region without neurogenic claudication: Secondary | ICD-10-CM

## 2012-05-14 DIAGNOSIS — R5383 Other fatigue: Secondary | ICD-10-CM

## 2012-05-14 NOTE — Patient Instructions (Signed)
You will be set up for a Brain Scan  Referral to specialist  Continue your current medications Get the labs done fasting F/U 4 months regular visit

## 2012-05-16 DIAGNOSIS — R29898 Other symptoms and signs involving the musculoskeletal system: Secondary | ICD-10-CM | POA: Insufficient documentation

## 2012-05-16 DIAGNOSIS — M48061 Spinal stenosis, lumbar region without neurogenic claudication: Secondary | ICD-10-CM | POA: Insufficient documentation

## 2012-05-16 NOTE — Progress Notes (Signed)
  Subjective:    Patient ID: Molly Patel, female    DOB: [birth year], 66 y.o.   MRN: [id number]  HPI Pt here s/p another syncopal episode, she thinks it is because of trembling and weakness in her right leg and it goes out, then she blacks out. She occasionally has low back pain but this does not bother her typically. Her episodes were witnessed, she was sitting on a bar stool and remember her leg starting to shake then she woke up on the floor. No seizure like activity per pt. No trauma to head She has had these episodes > 1 year, on previous visits she never mentioned leg trembling or weakness prior to any episodes, she has stated occasionally her knee feels like it may give out   Review of Systems  GEN- + fatigue, fever, weight loss,weakness, recent illness HEENT- denies eye drainage, change in vision, nasal discharge, CVS- denies chest pain, palpitations RESP- denies SOB, cough, wheeze ABD- denies N/V, change in stools, abd pain GU- denies dysuria, hematuria, dribbling, incontinence MSK- + joint pain, muscle aches, injury Neuro- denies headache, dizziness, +syncope, seizure activity      Objective:   Physical Exam GEN- NAD, alert and oriented x3 HEENT- PERRL, EOMI, non injected sclera, pink conjunctiva, MMM, oropharynx clear Neck- Supple,  CVS- RRR, no murmur RESP-CTAB ABD-NABS,soft,NT,ND EXT- trace edema Back- Spine NT, neg SLR Pulses- Radial, DP- 2+ Neuro-CNII-XII in tact, no focal deficits, normal finger to nose, neg rhomberg, normal alternating movements, normal heel to shin, memory grossly in tact       Assessment & Plan:

## 2012-05-16 NOTE — Assessment & Plan Note (Signed)
She has MRI of back from 2011 showing stenosis and disc bulge, this could also be causing leg weakness, but she denies back pain or leg pain. Will proceed with neurology first as syncopal episodes more concerning

## 2012-05-16 NOTE — Assessment & Plan Note (Signed)
Recurrent episdes, she agrees today to be seen for this, advised her I am not clear on if the leg tremor is related to black out episodes as her history regarding the episodes are very confusing. She has had negative cardiac work-up, neg pulmonary work-up. Obtain MRI brain, refer to neurology. ? Seizure disorder, TIA  Other neurogenic cause of syncope

## 2012-05-16 NOTE — Assessment & Plan Note (Signed)
Well controlled 

## 2012-05-16 NOTE — Assessment & Plan Note (Signed)
Leg weakness unclear what pt exact concerns are with exception of syncope, she may need neurosurgery referral for back eventually. Note she does not want to see many specialist even though she comes in with these concerns

## 2012-06-15 ENCOUNTER — Other Ambulatory Visit: Payer: Self-pay

## 2012-06-15 ENCOUNTER — Emergency Department (HOSPITAL_COMMUNITY)
Admission: EM | Admit: 2012-06-15 | Discharge: 2012-06-15 | Disposition: A | Discharge status: Home / Self Care | Payer: Medicare PPO | Attending: Emergency Medicine | Admitting: Emergency Medicine

## 2012-06-15 ENCOUNTER — Encounter (HOSPITAL_COMMUNITY): Payer: Self-pay | Admitting: Emergency Medicine

## 2012-06-15 ENCOUNTER — Other Ambulatory Visit: Payer: Self-pay | Admitting: Family Medicine

## 2012-06-15 ENCOUNTER — Ambulatory Visit (HOSPITAL_COMMUNITY)
Admission: RE | Admit: 2012-06-15 | Discharge: 2012-06-15 | Disposition: A | Discharge status: Home / Self Care | Payer: Medicare PPO | Source: Ambulatory Visit | Attending: Family Medicine | Admitting: Family Medicine

## 2012-06-15 ENCOUNTER — Ambulatory Visit (HOSPITAL_COMMUNITY): Payer: Medicare PPO

## 2012-06-15 DIAGNOSIS — R55 Syncope and collapse: Secondary | ICD-10-CM

## 2012-06-15 DIAGNOSIS — E785 Hyperlipidemia, unspecified: Secondary | ICD-10-CM | POA: Insufficient documentation

## 2012-06-15 DIAGNOSIS — D496 Neoplasm of unspecified behavior of brain: Secondary | ICD-10-CM | POA: Insufficient documentation

## 2012-06-15 DIAGNOSIS — E559 Vitamin D deficiency, unspecified: Secondary | ICD-10-CM | POA: Insufficient documentation

## 2012-06-15 DIAGNOSIS — I10 Essential (primary) hypertension: Secondary | ICD-10-CM | POA: Insufficient documentation

## 2012-06-15 DIAGNOSIS — Z8742 Personal history of other diseases of the female genital tract: Secondary | ICD-10-CM | POA: Insufficient documentation

## 2012-06-15 DIAGNOSIS — G936 Cerebral edema: Secondary | ICD-10-CM | POA: Insufficient documentation

## 2012-06-15 DIAGNOSIS — Z1239 Encounter for other screening for malignant neoplasm of breast: Secondary | ICD-10-CM

## 2012-06-15 DIAGNOSIS — Z1231 Encounter for screening mammogram for malignant neoplasm of breast: Secondary | ICD-10-CM | POA: Insufficient documentation

## 2012-06-15 DIAGNOSIS — Z87891 Personal history of nicotine dependence: Secondary | ICD-10-CM | POA: Insufficient documentation

## 2012-06-15 DIAGNOSIS — G9389 Other specified disorders of brain: Secondary | ICD-10-CM

## 2012-06-15 LAB — POCT I-STAT, CHEM 8
BUN: 13 mg/dL (ref 6–23)
Calcium, Ion: 0.97 mmol/L — ABNORMAL LOW (ref 1.13–1.30)
Chloride: 108 mEq/L (ref 96–112)
Creatinine, Ser: 0.9 mg/dL (ref 0.50–1.10)
Glucose, Bld: 77 mg/dL (ref 70–99)

## 2012-06-15 LAB — BASIC METABOLIC PANEL
BUN: 14 mg/dL (ref 6–23)
CO2: 30 mEq/L (ref 19–32)
Calcium: 9.7 mg/dL (ref 8.4–10.5)
Chloride: 100 mEq/L (ref 96–112)
Creatinine, Ser: 0.82 mg/dL (ref 0.50–1.10)
GFR calc Af Amer: 83 mL/min — ABNORMAL LOW (ref >90)
GFR calc non Af Amer: 72 mL/min — ABNORMAL LOW (ref >90)
Glucose, Bld: 80 mg/dL (ref 70–99)
Potassium: 3.3 mEq/L — ABNORMAL LOW (ref 3.5–5.1)
Sodium: 141 mEq/L (ref 135–145)

## 2012-06-15 LAB — CBC
HCT: 45.2 % (ref 36.0–46.0)
Hemoglobin: 15 g/dL (ref 12.0–15.0)
MCH: 30.5 pg (ref 26.0–34.0)
MCHC: 33.2 g/dL (ref 30.0–36.0)
MCV: 92.1 fL (ref 78.0–100.0)
Platelets: 219 10*3/uL (ref 150–400)
RBC: 4.91 MIL/uL (ref 3.87–5.11)
RDW: 14.2 % (ref 11.5–15.5)
WBC: 8.5 10*3/uL (ref 4.0–10.5)

## 2012-06-15 MED ORDER — DEXAMETHASONE SODIUM PHOSPHATE 4 MG/ML IJ SOLN
10.0000 mg | Freq: Once | INTRAMUSCULAR | Status: AC
  Administered 2012-06-15: 10 mg via INTRAVENOUS
  Filled 2012-06-15: qty 3

## 2012-06-15 MED ORDER — GADOBENATE DIMEGLUMINE 529 MG/ML IV SOLN
18.0000 mL | Freq: Once | INTRAVENOUS | Status: AC | PRN
  Administered 2012-06-15: 18 mL via INTRAVENOUS

## 2012-06-15 MED ORDER — POTASSIUM CHLORIDE CRYS ER 20 MEQ PO TBCR
20.0000 meq | EXTENDED_RELEASE_TABLET | Freq: Once | ORAL | Status: AC
  Administered 2012-06-15: 20 meq via ORAL
  Filled 2012-06-15: qty 1

## 2012-06-15 MED ORDER — DEXAMETHASONE 4 MG PO TABS: 4.0000 mg | ORAL_TABLET | Freq: Two times a day (BID) | ORAL | Status: DC

## 2012-06-15 MED ORDER — LORAZEPAM 2 MG/ML IJ SOLN
1.0000 mg | Freq: Once | INTRAMUSCULAR | Status: AC
  Administered 2012-06-15: 2 mg via INTRAVENOUS
  Filled 2012-06-15: qty 1

## 2012-06-15 NOTE — ED Notes (Signed)
Pt states she was sent to hospital for mammogram and head mri due to multiple syncopal episodes. Pt sent to ed from mri table for abnormal mri.

## 2012-06-15 NOTE — ED Provider Notes (Signed)
History    66 year old female referred to the emergency room after having an abnormal MRI. MRI showed a left frontal mass with a significant amount of surrounding edema and midline shift. Patient has been having occasional syncopal events over the past several months. MRI was ordered as part of evaluation for this. Patient states that before these events her right lower extremity or shaking feel weak. She then previously loses consciousness. No seizure activity reported and she has a quick return to baseline. She says that when she lifts her RUE over her head it will make her feel like passing out as well. Currently she has no complaints. She denies any headaches. No focal numbness, tingling or loss of strength. No acute visual complaints.  CSN: [id number]  Arrival date & time 06/15/12  1409   First MD Initiated Contact with Patient 06/15/12 1411      Chief Complaint  Patient presents with  . Loss of Consciousness    (Consider location/radiation/quality/duration/timing/severity/associated sxs/prior treatment) HPI  Past Medical History  Diagnosis Date  . Hypertension   . Incontinence   . Hyperlipidemia   . Insomnia   . Syncope   . Angioedema   . Echocardiogram with ECG monitoring july 2012    mild LVH, normal event monitor   . Ovarian cyst   . Vitamin D deficiency     Past Surgical History  Procedure Date  . Vesicovaginal fistula closure w/ tah   . Abdominal hysterectomy     Family History  Problem Relation Age of Onset  . Asthma Grandchild   . Arthritis Sister   . Diabetes Sister   . Colon cancer Brother   . Stroke Father     History  Substance Use Topics  . Smoking status: Former Smoker -- 7 years    Types: Cigarettes    Quit date: 08/25/1985  . Smokeless tobacco: Never Used  . Alcohol Use: No    OB History    Grav Para Term Preterm Abortions TAB SAB Ect Mult Living                  Review of Systems   Review of symptoms negative unless otherwise  noted in HPI.   Allergies  Review of patient's allergies indicates no known allergies.  Home Medications   Current Outpatient Rx  Name Route Sig Dispense Refill  . ATENOLOL 50 MG PO TABS Oral Take 1 tablet (50 mg total) by mouth daily. 90 tablet 1  . CALCIUM CARBONATE-VITAMIN D 600-200 MG-UNIT PO TABS Oral Take by mouth.    Marland Kitchen HYDROCHLOROTHIAZIDE 25 MG PO TABS Oral Take 1 tablet (25 mg total) by mouth daily. 90 tablet 1  . RED YEAST RICE PO Oral Take by mouth.    Marland Kitchen VITAMIN C 500 MG PO TABS Oral Take 500 mg by mouth daily.      BP 129/65  Pulse 50  Temp 98.1 F (36.7 C)  Resp 18  Ht 5\' 7"  (1.702 m)  Wt 193 lb (87.544 kg)  BMI 30.23 kg/m2  SpO2 100%  Physical Exam  Nursing note and vitals reviewed. Constitutional: She is oriented to person, place, and time. She appears well-developed and well-nourished. No distress.  HENT:  Head: Normocephalic and atraumatic.  Eyes: Conjunctivae normal are normal. Pupils are equal, round, and reactive to light. Right eye exhibits no discharge. Left eye exhibits no discharge.  Neck: Neck supple.  Cardiovascular: Normal rate, regular rhythm and normal heart sounds.  Exam reveals no gallop and  no friction rub.   No murmur heard. Pulmonary/Chest: Effort normal and breath sounds normal. No respiratory distress.  Abdominal: Soft. She exhibits no distension. There is no tenderness.  Musculoskeletal: She exhibits no edema and no tenderness.  Neurological: She is alert and oriented to person, place, and time. No cranial nerve deficit. She exhibits normal muscle tone. Coordination normal.  Skin: Skin is warm and dry.  Psychiatric: She has a normal mood and affect. Her behavior is normal. Thought content normal.    ED Course  Procedures (including critical care time)   Labs Reviewed  BASIC METABOLIC PANEL  CBC   Mr Laqueta Jean Wo Contrast  06/15/2012  *RADIOLOGY REPORT*  Clinical Data: 1 year of headaches.  MRI HEAD WITHOUT AND WITH CONTRAST   Technique:  Multiplanar, multiecho pulse sequences of the brain and surrounding structures were obtained according to standard protocol without and with intravenous contrast  Comparison: None.  Findings: Anterior left frontal 2.4 x 2.9 x 3.5 cm enhancing mass which appears extra-axial with adjacent left frontal calvarial bony overgrowth/hyperostosis.  This borders the medial aspect of the anterior falx and has adjacent posterior lateral 2.1 x 1.9 x 1.2 cm area of trapped cerebrospinal fluid most consistent with a meningioma.  Significant surrounding vasogenic edema which crosses midline into the corpus callosum with edema extending throughout majority of the left frontal lobe into the region of the basal ganglion causing posterior inferior displacement of the left lateral ventricle and midline shift to the right by 1.6 cm.  Displacement surrounding vessels. Distortion of the upper mid brain.  No other intracranial mass identified.  Expanded empty sella.  This can be seen with pseudotumor cerebri. No flattening of the posterior globes.  Major intracranial vascular structures are patent with small left vertebral artery.  Minimal paranasal sinus mucosal thickening.  No intracranial hemorrhage.  No acute infarct.  IMPRESSION: Anterior left frontal lobe mass suggestive of meningioma with marked surrounding mass effect and midline shift to the right by 1.6 cm as detailed above.  Examination discussed with Dr. Jeanice Lim at the time of imaging. Patient transported to emergency room.   Original Report Authenticated By: Fuller Canada, M.D.    EKG:  Rhythm: Sinus bradycardia Rate: 54 Axis: Normal Intervals: Normal ST segments: Nonspecific ST changes. There is T-wave flattening inferiorly and laterally.   1. Brain mass   2. Syncope       MDM  66yF with abnormal MRI. Frontal mass with significant amount of surrounding edema. Nonfocal neuro exam. Discussed with neurosurgery. Can see pt tomorrow. No indication for  admission. Plan steroids. Return precautions discussed.        Raeford Razor, MD 06/15/12 2108

## 2012-06-16 ENCOUNTER — Other Ambulatory Visit: Payer: Self-pay | Admitting: Neurosurgery

## 2012-06-18 ENCOUNTER — Telehealth: Payer: Self-pay | Admitting: Family Medicine

## 2012-06-18 NOTE — Telephone Encounter (Signed)
Spoke with pt, doing okay, seen by neurosurgery, has surgery scheduled for Nov 5 for brain tumor Advised her to call with any concerns

## 2012-06-21 ENCOUNTER — Encounter (HOSPITAL_COMMUNITY): Payer: Self-pay | Admitting: Pharmacy Technician

## 2012-06-21 ENCOUNTER — Telehealth: Payer: Self-pay | Admitting: Family Medicine

## 2012-06-21 MED ORDER — CEPHALEXIN 500 MG PO CAPS: 500.0000 mg | ORAL_CAPSULE | Freq: Two times a day (BID) | ORAL | Status: DC

## 2012-06-21 NOTE — Telephone Encounter (Signed)
Med faxed to [facility].

## 2012-06-21 NOTE — Telephone Encounter (Signed)
She is to take Keflex twice a day Also use warm compresses, if this does not improve by Thursday come to have this looked at   Please call and cancel prescription I sent to [facility]

## 2012-06-21 NOTE — Telephone Encounter (Signed)
Please advise 

## 2012-06-21 NOTE — Telephone Encounter (Signed)
Called and left message for patient to return call.  

## 2012-06-24 ENCOUNTER — Encounter: Payer: Self-pay | Admitting: Family Medicine

## 2012-06-24 ENCOUNTER — Ambulatory Visit (INDEPENDENT_AMBULATORY_CARE_PROVIDER_SITE_OTHER): Payer: Medicare PPO | Admitting: Family Medicine

## 2012-06-24 VITALS — BP 120/80 | HR 72 | Resp 15 | Ht 67.0 in | Wt 190.4 lb

## 2012-06-24 DIAGNOSIS — N764 Abscess of vulva: Secondary | ICD-10-CM

## 2012-06-24 DIAGNOSIS — L0292 Furuncle, unspecified: Secondary | ICD-10-CM

## 2012-06-24 DIAGNOSIS — L0293 Carbuncle, unspecified: Secondary | ICD-10-CM

## 2012-06-24 MED ORDER — SULFAMETHOXAZOLE-TRIMETHOPRIM 800-160 MG PO TABS: 1.0000 | ORAL_TABLET | Freq: Two times a day (BID) | ORAL | Status: DC

## 2012-06-24 NOTE — Assessment & Plan Note (Signed)
Incision, drainage performed. Culture sent Change antibiotics to Bactrim F/U Mon for recheck

## 2012-06-24 NOTE — Patient Instructions (Addendum)
Start new antibiotics Bactrim- take 1 tablet twice a day Recheck Monday- AM   Incision and Drainage of Abscess An abscess (boil or furuncle) is an area infected by germs that contains a collection of pus. Signs and problems (symptoms) of an abscess include pain, tenderness, redness, or hardness. You may feel a moveable, soft area under your skin. An abscess can occur anywhere in the body. Occasionally, this may spread to surrounding tissues causing cellulitis. Sometimes, a surgeon may make a cut (incision) over your abscess. The pus is drained. Gauze may be packed into the space to provide a drain. Keeping a drain or piece of gauze in the incision keeps the skin from healing first. This helps stop the abscess from forming again. The area may be painful for 5 to 7 days. Most people with an abscess do not have high fevers. If seen early, your abscess may not have localized and may not be cut. If it does not get better on its own or with medicines, you may require another appointment. HOME CARE INSTRUCTIONS    Only take over-the-counter or prescription medicines for pain, discomfort, or fever as directed by your caregiver. Use these only if your caregiver has not given medicines that would interfere.     When you bathe, remove the gauze drain after soaking. You may then wash the wound gently with mild, soapy water. Repack with gauze as your caregiver directs.     See your caregiver as directed for a recheck.     If antibiotics were prescribed, take them as directed.  SEEK MEDICAL CARE IF:    You develop increased pain, swelling, redness, drainage, or bleeding in the wound site.     You develop signs of generalized infection, including muscle aches, chills, or a general ill feeling.     You or your child has an oral temperature above 102 F (38.9 C).  MAKE SURE YOU:    Understand these instructions.     Will watch your condition.     Will get help right away if you are not doing well or get  worse.  Document Released: 02/04/2001 Document Revised: 04/23/2011 Document Reviewed: 03/31/2008 [facility] Patient Information 2012 [place], [place].

## 2012-06-24 NOTE — Progress Notes (Signed)
  Subjective:    Patient ID: Molly Patel, female    DOB: Jan 15, 1946, 66 y.o.   MRN: [id number]  HPI Boil on left labia majora and in inguinal area for past week, started keflex on Monday but boil is not improving, also tried warm soaks. No fever, chills. Scheduled to have brain surgery on meningioma causing mass effect.    Review of Systems - per above  GEN- denies fatigue, fever, weight loss,weakness, recent illness HEENT- denies eye drainage, change in vision, nasal discharge, CVS- denies chest pain, palpitations RESP- denies SOB, cough, wheeze ABD- denies N/V, change in stools, abd pain Skin-+ boil      Objective:   Physical Exam  GEN-NAD, alert and oriented x 3 Skin- Left labia majora at crease of inguinal, moderate size boil,+ induration surrounding, mild erythema and some hyperpigmentation, TTP, fluctuant region in center Procedure- Incision and Drainage Procedure explained to patient questions answered benefits and risks discussed written consent obtained. Antiseptic-Betadine Anesthesia-lidocaine Incision performed large amount of pus expressed Culture taken Minimal blood loss Patient tolerated procedure well 2inches of iodoform packed, Bandage applied       Assessment & Plan:

## 2012-06-24 NOTE — Telephone Encounter (Signed)
Patient aware and states that problem is no better.  Directed to make appt.

## 2012-06-25 ENCOUNTER — Telehealth: Payer: Self-pay | Admitting: Family Medicine

## 2012-06-25 ENCOUNTER — Encounter (HOSPITAL_COMMUNITY)
Admission: RE | Admit: 2012-06-25 | Discharge: 2012-06-25 | Disposition: A | Discharge status: Home / Self Care | Payer: Medicare PPO | Source: Ambulatory Visit | Attending: Neurosurgery | Admitting: Neurosurgery

## 2012-06-25 ENCOUNTER — Encounter (HOSPITAL_COMMUNITY): Payer: Self-pay

## 2012-06-25 HISTORY — DX: Dizziness and giddiness: R42

## 2012-06-25 HISTORY — DX: Unspecified convulsions: R56.9

## 2012-06-25 HISTORY — DX: Depression, unspecified: F32.A

## 2012-06-25 HISTORY — DX: Major depressive disorder, single episode, unspecified: F32.9

## 2012-06-25 LAB — SURGICAL PCR SCREEN
MRSA, PCR: NEGATIVE
Staphylococcus aureus: NEGATIVE

## 2012-06-25 MED ORDER — SULFAMETHOXAZOLE-TRIMETHOPRIM 800-160 MG PO TABS: 1.0000 | ORAL_TABLET | Freq: Two times a day (BID) | ORAL | Status: AC

## 2012-06-25 NOTE — Pre-Procedure Instructions (Signed)
20 SHANIQUIA BRAFFORD  06/25/2012   Your procedure is scheduled on:  Tuesday, November 5th  Report to [facility] at 0730 AM.  Call this number if you have problems the morning of surgery: [phone]   Remember:   Do not eat food or drink:After Midnight.   Take these medicines the morning of surgery with A SIP OF WATER: atenolol, dilantin, septra,    Do not wear jewelry, make-up or nail polish.  Do not wear lotions, powders, or perfumes.   Do not shave 48 hours prior to surgery. Men may shave face and neck.  Do not bring valuables to the hospital.  Contacts, dentures or bridgework may not be worn into surgery.  Leave suitcase in the car. After surgery it may be brought to your room.  For patients admitted to the hospital, checkout time is 11:00 AM the day of discharge.   Patients discharged the day of surgery will not be allowed to drive home.   Special Instructions: Shower using CHG 2 nights before surgery and the night before surgery.  If you shower the day of surgery use CHG.  Use special wash - you have one bottle of CHG for all showers.  You should use approximately 1/3 of the bottle for each shower.   Please read over the following fact sheets that you were given: Pain Booklet, Coughing and Deep Breathing, Blood Transfusion Information, MRSA Information and Surgical Site Infection Prevention

## 2012-06-25 NOTE — Progress Notes (Signed)
Patient had recent cbc and bmet on 06/15/12 which is within 14 days of surgery

## 2012-06-25 NOTE — Progress Notes (Signed)
Primary Physician - Dr. Illene Regulus in [place] Does not have a cardiologist  EKG - 06-16-2012 in epic, Echo 2012 in epic Stress Test 2012 in epic no previous cardiac cath

## 2012-06-25 NOTE — Telephone Encounter (Signed)
Med sent in yesterday and sent again today

## 2012-06-25 NOTE — Telephone Encounter (Signed)
Patient aware.

## 2012-06-28 ENCOUNTER — Ambulatory Visit (INDEPENDENT_AMBULATORY_CARE_PROVIDER_SITE_OTHER): Payer: Medicare PPO | Admitting: Family Medicine

## 2012-06-28 ENCOUNTER — Encounter: Payer: Self-pay | Admitting: Family Medicine

## 2012-06-28 VITALS — BP 136/80 | HR 80 | Resp 18 | Ht 67.0 in | Wt 188.1 lb

## 2012-06-28 DIAGNOSIS — N764 Abscess of vulva: Secondary | ICD-10-CM

## 2012-06-28 LAB — WOUND CULTURE
Gram Stain: NONE SEEN
Gram Stain: NONE SEEN

## 2012-06-28 MED ORDER — CEFAZOLIN SODIUM-DEXTROSE 2-3 GM-% IV SOLR
2.0000 g | INTRAVENOUS | Status: DC
  Filled 2012-06-28: qty 50

## 2012-06-28 NOTE — Assessment & Plan Note (Addendum)
Surrounding cellulitis and abscess is much imprved. I will have her complete the Bactrim. No further intervention needed, preliminary wound cultures shows Staph Aureus. Her neurosurgeon will be notified that she does have an abscess she or he had her preop and was cleared much improved. The lesion is now starting to close up she is very minimal drainage but she still has some induration surrounding the initial I & D  site. She is pain-free at this time. She had her pre-op and was cleared

## 2012-06-28 NOTE — Progress Notes (Signed)
  Subjective:    Patient ID: Molly Patel, female    DOB: 1945-12-10, 66 y.o.   MRN: [id number]  HPI  Patient presents for wound check. She was seen last Friday with abscess of left labia majora. She states she's had decreased drainage. She denies any pain. An overall states the swelling has went down. She still taking antibiotic as prescribed. She's not had any fever, nausea, vomiting, chills.  Review of Systems  GEN- denies fatigue, fever, weight loss,weakness, recent illness HEENT- denies eye drainage, change in vision, nasal discharge, CVS- denies chest pain, palpitations RESP- denies SOB, cough, wheeze ABD- denies N/V, change in stools, abd pain       Objective:   Physical Exam  GEN-NAD, alert and oriented x 3 Skin- Left labia majora at crease of inguinal, ,+ induration surrounding,no erythema and some hyperpigmentation, non tender, minimal fluctuance, minimal serosanguinous discharge      Assessment & Plan:

## 2012-06-28 NOTE — Patient Instructions (Addendum)
Complete antibiotics  Continue sitz baths I will let surgeon know about abscess.  F/U as directed after surgery

## 2012-06-29 ENCOUNTER — Ambulatory Visit (HOSPITAL_COMMUNITY): Payer: Medicare PPO

## 2012-06-29 ENCOUNTER — Inpatient Hospital Stay (HOSPITAL_COMMUNITY)
Admission: RE | Admit: 2012-06-29 | Discharge: 2012-07-02 | DRG: 025 | Disposition: A | Discharge status: Home / Self Care | Payer: Medicare PPO | Source: Ambulatory Visit | Attending: Neurosurgery | Admitting: Neurosurgery

## 2012-06-29 ENCOUNTER — Encounter (HOSPITAL_COMMUNITY): Payer: Self-pay | Admitting: Certified Registered Nurse Anesthetist

## 2012-06-29 ENCOUNTER — Encounter (HOSPITAL_COMMUNITY)
Admission: RE | Disposition: A | Discharge status: Home / Self Care | Payer: Self-pay | Source: Ambulatory Visit | Attending: Neurosurgery

## 2012-06-29 ENCOUNTER — Ambulatory Visit (HOSPITAL_COMMUNITY): Payer: Medicare PPO | Admitting: Certified Registered Nurse Anesthetist

## 2012-06-29 DIAGNOSIS — Z01812 Encounter for preprocedural laboratory examination: Secondary | ICD-10-CM

## 2012-06-29 DIAGNOSIS — E559 Vitamin D deficiency, unspecified: Secondary | ICD-10-CM | POA: Diagnosis present

## 2012-06-29 DIAGNOSIS — G40909 Epilepsy, unspecified, not intractable, without status epilepticus: Secondary | ICD-10-CM | POA: Diagnosis present

## 2012-06-29 DIAGNOSIS — Z01818 Encounter for other preprocedural examination: Secondary | ICD-10-CM

## 2012-06-29 DIAGNOSIS — F3289 Other specified depressive episodes: Secondary | ICD-10-CM | POA: Diagnosis present

## 2012-06-29 DIAGNOSIS — Z87891 Personal history of nicotine dependence: Secondary | ICD-10-CM

## 2012-06-29 DIAGNOSIS — G936 Cerebral edema: Secondary | ICD-10-CM | POA: Diagnosis present

## 2012-06-29 DIAGNOSIS — F329 Major depressive disorder, single episode, unspecified: Secondary | ICD-10-CM | POA: Diagnosis present

## 2012-06-29 DIAGNOSIS — I10 Essential (primary) hypertension: Secondary | ICD-10-CM | POA: Diagnosis present

## 2012-06-29 DIAGNOSIS — Z79899 Other long term (current) drug therapy: Secondary | ICD-10-CM

## 2012-06-29 DIAGNOSIS — E785 Hyperlipidemia, unspecified: Secondary | ICD-10-CM | POA: Diagnosis present

## 2012-06-29 DIAGNOSIS — D320 Benign neoplasm of cerebral meninges: Principal | ICD-10-CM | POA: Diagnosis present

## 2012-06-29 DIAGNOSIS — D332 Benign neoplasm of brain, unspecified: Secondary | ICD-10-CM

## 2012-06-29 HISTORY — PX: CRANIOTOMY: SHX93

## 2012-06-29 LAB — BLOOD GAS, ARTERIAL
Acid-base deficit: 14.5 mmol/L — ABNORMAL HIGH (ref 0.0–2.0)
Bicarbonate: 9.8 mEq/L — ABNORMAL LOW (ref 20.0–24.0)
TCO2: 10.3 mmol/L (ref 0–100)
pCO2 arterial: 17 mmHg — CL (ref 35.0–45.0)
pH, Arterial: 7.381 (ref 7.350–7.450)

## 2012-06-29 LAB — BASIC METABOLIC PANEL
BUN: 30 mg/dL — ABNORMAL HIGH (ref 6–23)
CO2: 28 mEq/L (ref 19–32)
Chloride: 101 mEq/L (ref 96–112)
Creatinine, Ser: 0.73 mg/dL (ref 0.50–1.10)
GFR calc Af Amer: >90 mL/min (ref >90)
Glucose, Bld: 143 mg/dL — ABNORMAL HIGH (ref 70–99)
Potassium: 4.1 mEq/L (ref 3.5–5.1)

## 2012-06-29 LAB — CBC
HCT: 19.9 % — ABNORMAL LOW (ref 36.0–46.0)
HCT: 41.2 % (ref 36.0–46.0)
Hemoglobin: 14 g/dL (ref 12.0–15.0)
MCH: 30.5 pg (ref 26.0–34.0)
MCV: 93.4 fL (ref 78.0–100.0)
MCV: 92.2 fL (ref 78.0–100.0)
Platelets: 60 10*3/uL — ABNORMAL LOW (ref 150–400)
RBC: 2.13 MIL/uL — ABNORMAL LOW (ref 3.87–5.11)
RBC: 4.47 MIL/uL (ref 3.87–5.11)
WBC: 12.8 10*3/uL — ABNORMAL HIGH (ref 4.0–10.5)

## 2012-06-29 SURGERY — CRANIOTOMY TUMOR EXCISION
Anesthesia: General | Site: Head | Laterality: Left | Wound class: Clean

## 2012-06-29 MED ORDER — VANCOMYCIN HCL IN DEXTROSE 1-5 GM/200ML-% IV SOLN
INTRAVENOUS | Status: AC
  Administered 2012-06-29: 1000 mg via INTRAVENOUS
  Filled 2012-06-29: qty 200

## 2012-06-29 MED ORDER — KCL IN DEXTROSE-NACL 20-5-0.45 MEQ/L-%-% IV SOLN
INTRAVENOUS | Status: DC
  Administered 2012-06-29: 17:00:00 via INTRAVENOUS
  Administered 2012-07-01 (×2): 75 mL/h via INTRAVENOUS
  Administered 2012-07-02: 06:00:00 via INTRAVENOUS
  Filled 2012-06-29 (×9): qty 1000

## 2012-06-29 MED ORDER — SODIUM CHLORIDE 0.9 % IV SOLN
INTRAVENOUS | Status: DC | PRN
  Administered 2012-06-29 (×2): via INTRAVENOUS

## 2012-06-29 MED ORDER — ATENOLOL 50 MG PO TABS
50.0000 mg | ORAL_TABLET | Freq: Every day | ORAL | Status: DC
  Administered 2012-06-30 – 2012-07-02 (×3): 50 mg via ORAL
  Filled 2012-06-29 (×3): qty 1

## 2012-06-29 MED ORDER — ONDANSETRON HCL 4 MG PO TABS: 4.0000 mg | ORAL_TABLET | ORAL | Status: DC | PRN

## 2012-06-29 MED ORDER — ACETAMINOPHEN 325 MG PO TABS: 650.0000 mg | ORAL_TABLET | ORAL | Status: DC | PRN

## 2012-06-29 MED ORDER — HYDROMORPHONE HCL PF 1 MG/ML IJ SOLN: 0.5000 mg | INTRAMUSCULAR | Status: DC | PRN

## 2012-06-29 MED ORDER — PROMETHAZINE HCL 25 MG PO TABS: 12.5000 mg | ORAL_TABLET | ORAL | Status: DC | PRN

## 2012-06-29 MED ORDER — PANTOPRAZOLE SODIUM 40 MG IV SOLR
40.0000 mg | Freq: Every day | INTRAVENOUS | Status: DC
  Administered 2012-06-29 – 2012-06-30 (×2): 40 mg via INTRAVENOUS
  Filled 2012-06-29 (×3): qty 40

## 2012-06-29 MED ORDER — MANNITOL 20 % IV SOLN
INTRAVENOUS | Status: DC | PRN
  Administered 2012-06-29: 11:00:00 via INTRAVENOUS

## 2012-06-29 MED ORDER — PHENYLEPHRINE HCL 10 MG/ML IJ SOLN
INTRAMUSCULAR | Status: DC | PRN
  Administered 2012-06-29 (×2): 80 ug via INTRAVENOUS

## 2012-06-29 MED ORDER — DEXAMETHASONE SODIUM PHOSPHATE 10 MG/ML IJ SOLN
6.0000 mg | Freq: Four times a day (QID) | INTRAMUSCULAR | Status: AC
  Administered 2012-06-29 – 2012-06-30 (×4): 6 mg via INTRAVENOUS
  Filled 2012-06-29 (×4): qty 0.6

## 2012-06-29 MED ORDER — MANNITOL 20 % IV SOLN: INTRAVENOUS | Status: DC | PRN

## 2012-06-29 MED ORDER — ONDANSETRON HCL 4 MG/2ML IJ SOLN
INTRAMUSCULAR | Status: DC | PRN
  Administered 2012-06-29: 4 mg via INTRAVENOUS

## 2012-06-29 MED ORDER — DEXAMETHASONE SODIUM PHOSPHATE 10 MG/ML IJ SOLN
INTRAMUSCULAR | Status: AC
  Filled 2012-06-29: qty 1

## 2012-06-29 MED ORDER — BIOTENE DRY MOUTH MT LIQD
15.0000 mL | Freq: Two times a day (BID) | OROMUCOSAL | Status: DC
  Administered 2012-06-29 – 2012-07-02 (×6): 15 mL via OROMUCOSAL

## 2012-06-29 MED ORDER — PROPOFOL 10 MG/ML IV BOLUS
INTRAVENOUS | Status: DC | PRN
  Administered 2012-06-29: 160 mg via INTRAVENOUS

## 2012-06-29 MED ORDER — SODIUM CHLORIDE 0.9 % IR SOLN
Status: DC | PRN
  Administered 2012-06-29: 11:00:00

## 2012-06-29 MED ORDER — DEXAMETHASONE SODIUM PHOSPHATE 10 MG/ML IJ SOLN
10.0000 mg | INTRAMUSCULAR | Status: AC
  Administered 2012-06-29: 10 mg via INTRAVENOUS

## 2012-06-29 MED ORDER — PHENYTOIN SODIUM EXTENDED 100 MG PO CAPS
200.0000 mg | ORAL_CAPSULE | Freq: Two times a day (BID) | ORAL | Status: DC
  Administered 2012-06-29 – 2012-07-02 (×6): 200 mg via ORAL
  Filled 2012-06-29 (×8): qty 2

## 2012-06-29 MED ORDER — MIDAZOLAM HCL 2 MG/2ML IJ SOLN: 2.0000 mg | Freq: Once | INTRAMUSCULAR | Status: DC

## 2012-06-29 MED ORDER — THROMBIN 20000 UNITS EX SOLR
CUTANEOUS | Status: DC | PRN
  Administered 2012-06-29: 11:00:00 via TOPICAL

## 2012-06-29 MED ORDER — LABETALOL HCL 5 MG/ML IV SOLN: 10.0000 mg | INTRAVENOUS | Status: DC | PRN

## 2012-06-29 MED ORDER — DOCUSATE SODIUM 100 MG PO CAPS
100.0000 mg | ORAL_CAPSULE | Freq: Two times a day (BID) | ORAL | Status: DC
  Administered 2012-06-29 – 2012-07-02 (×6): 100 mg via ORAL
  Filled 2012-06-29 (×7): qty 1

## 2012-06-29 MED ORDER — EPHEDRINE SULFATE 50 MG/ML IJ SOLN
INTRAMUSCULAR | Status: DC | PRN
  Administered 2012-06-29 (×2): 5 mg via INTRAVENOUS

## 2012-06-29 MED ORDER — ONDANSETRON HCL 4 MG/2ML IJ SOLN: 4.0000 mg | Freq: Once | INTRAMUSCULAR | Status: DC | PRN

## 2012-06-29 MED ORDER — FENTANYL CITRATE 0.05 MG/ML IJ SOLN
INTRAMUSCULAR | Status: DC | PRN
  Administered 2012-06-29: 50 ug via INTRAVENOUS
  Administered 2012-06-29 (×2): 100 ug via INTRAVENOUS
  Administered 2012-06-29: 50 ug via INTRAVENOUS

## 2012-06-29 MED ORDER — BACITRACIN 50000 UNITS IM SOLR
INTRAMUSCULAR | Status: AC
  Filled 2012-06-29: qty 1

## 2012-06-29 MED ORDER — HYDROMORPHONE HCL PF 1 MG/ML IJ SOLN: 0.2500 mg | INTRAMUSCULAR | Status: DC | PRN

## 2012-06-29 MED ORDER — VANCOMYCIN HCL IN DEXTROSE 1-5 GM/200ML-% IV SOLN
1000.0000 mg | Freq: Two times a day (BID) | INTRAVENOUS | Status: AC
  Administered 2012-06-29 – 2012-06-30 (×2): 1000 mg via INTRAVENOUS
  Filled 2012-06-29 (×3): qty 200

## 2012-06-29 MED ORDER — MICROFIBRILLAR COLL HEMOSTAT EX PADS
MEDICATED_PAD | CUTANEOUS | Status: DC | PRN
  Administered 2012-06-29: 1 via TOPICAL

## 2012-06-29 MED ORDER — MIDAZOLAM HCL 2 MG/2ML IJ SOLN
INTRAMUSCULAR | Status: AC
  Filled 2012-06-29: qty 2

## 2012-06-29 MED ORDER — LIDOCAINE HCL (PF) 1 % IJ SOLN
INTRAMUSCULAR | Status: DC | PRN
  Administered 2012-06-29: 7 mL

## 2012-06-29 MED ORDER — FENTANYL CITRATE 0.05 MG/ML IJ SOLN
INTRAMUSCULAR | Status: AC
  Filled 2012-06-29: qty 2

## 2012-06-29 MED ORDER — HYDROCODONE-ACETAMINOPHEN 5-325 MG PO TABS
1.0000 | ORAL_TABLET | ORAL | Status: DC | PRN
Start: 2012-06-29 — End: 2012-07-02

## 2012-06-29 MED ORDER — SODIUM CHLORIDE 0.9 % IR SOLN
Status: DC | PRN
  Administered 2012-06-29 (×2): 1000 mL

## 2012-06-29 MED ORDER — HYDROCHLOROTHIAZIDE 25 MG PO TABS
25.0000 mg | ORAL_TABLET | Freq: Every day | ORAL | Status: DC
  Administered 2012-06-30 – 2012-07-02 (×3): 25 mg via ORAL
  Filled 2012-06-29 (×4): qty 1

## 2012-06-29 MED ORDER — DEXAMETHASONE SODIUM PHOSPHATE 4 MG/ML IJ SOLN
4.0000 mg | Freq: Three times a day (TID) | INTRAMUSCULAR | Status: DC
  Administered 2012-07-02: 4 mg via INTRAVENOUS
  Filled 2012-06-29 (×7): qty 1

## 2012-06-29 MED ORDER — DEXAMETHASONE SODIUM PHOSPHATE 4 MG/ML IJ SOLN
4.0000 mg | Freq: Four times a day (QID) | INTRAMUSCULAR | Status: AC
  Administered 2012-06-30 – 2012-07-01 (×4): 4 mg via INTRAVENOUS
  Filled 2012-06-29 (×5): qty 1

## 2012-06-29 MED ORDER — ARTIFICIAL TEARS OP OINT
TOPICAL_OINTMENT | OPHTHALMIC | Status: DC | PRN
  Administered 2012-06-29: 1 via OPHTHALMIC

## 2012-06-29 MED ORDER — ACETAMINOPHEN 10 MG/ML IV SOLN: 1000.0000 mg | Freq: Once | INTRAVENOUS | Status: DC | PRN

## 2012-06-29 MED ORDER — ONDANSETRON HCL 4 MG/2ML IJ SOLN: 4.0000 mg | INTRAMUSCULAR | Status: DC | PRN

## 2012-06-29 MED ORDER — BACITRACIN ZINC 500 UNIT/GM EX OINT
TOPICAL_OINTMENT | CUTANEOUS | Status: DC | PRN
  Administered 2012-06-29: 1 via TOPICAL

## 2012-06-29 MED ORDER — ACETAMINOPHEN 650 MG RE SUPP: 650.0000 mg | RECTAL | Status: DC | PRN

## 2012-06-29 MED ORDER — SODIUM CHLORIDE 0.9 % IV SOLN
INTRAVENOUS | Status: AC
  Filled 2012-06-29: qty 500

## 2012-06-29 MED ORDER — LIDOCAINE HCL 4 % MT SOLN
OROMUCOSAL | Status: DC | PRN
  Administered 2012-06-29: 4 mL via TOPICAL

## 2012-06-29 MED ORDER — FENTANYL CITRATE 0.05 MG/ML IJ SOLN: 100.0000 ug | Freq: Once | INTRAMUSCULAR | Status: DC

## 2012-06-29 MED ORDER — ROCURONIUM BROMIDE 100 MG/10ML IV SOLN
INTRAVENOUS | Status: DC | PRN
  Administered 2012-06-29 (×3): 10 mg via INTRAVENOUS
  Administered 2012-06-29: 50 mg via INTRAVENOUS
  Administered 2012-06-29: 20 mg via INTRAVENOUS

## 2012-06-29 SURGICAL SUPPLY — 78 items
BAG DECANTER FOR FLEXI CONT (MISCELLANEOUS) ×2 IMPLANT
BANDAGE GAUZE 4  KLING STR (GAUZE/BANDAGES/DRESSINGS) ×2 IMPLANT
BANDAGE GAUZE ELAST BULKY 4 IN (GAUZE/BANDAGES/DRESSINGS) IMPLANT
BLADE SURG ROTATE 9660 (MISCELLANEOUS) ×2 IMPLANT
BRUSH SCRUB EZ 1% IODOPHOR (MISCELLANEOUS) IMPLANT
BRUSH SCRUB EZ PLAIN DRY (MISCELLANEOUS) ×2 IMPLANT
BUR ROUTER D-58 CRANI (BURR) ×2 IMPLANT
CANISTER SUCTION 2500CC (MISCELLANEOUS) ×4 IMPLANT
CLIP TI MEDIUM 6 (CLIP) IMPLANT
CLOTH BEACON ORANGE TIMEOUT ST (SAFETY) ×2 IMPLANT
CONT SPEC 4OZ CLIKSEAL STRL BL (MISCELLANEOUS) ×4 IMPLANT
CORDS BIPOLAR (ELECTRODE) ×2 IMPLANT
DRAIN SNY WOU 7FLT (WOUND CARE) IMPLANT
DRAPE MICROSCOPE ZEISS OPMI (DRAPES) IMPLANT
DRAPE NEUROLOGICAL W/INCISE (DRAPES) ×2 IMPLANT
DRAPE SURG 17X23 STRL (DRAPES) ×8 IMPLANT
DRAPE WARM FLUID 44X44 (DRAPE) ×2 IMPLANT
DRESSING TELFA 8X3 (GAUZE/BANDAGES/DRESSINGS) IMPLANT
DURAFORM SPONGE 2X2 SINGLE (Neuro Prosthesis/Implant) ×2 IMPLANT
ELECT CAUTERY BLADE 6.4 (BLADE) IMPLANT
ELECT REM PT RETURN 9FT ADLT (ELECTROSURGICAL) ×2
ELECTRODE REM PT RTRN 9FT ADLT (ELECTROSURGICAL) ×1 IMPLANT
EVACUATOR 1/8 PVC DRAIN (DRAIN) IMPLANT
EVACUATOR SILICONE 100CC (DRAIN) IMPLANT
GAUZE SPONGE 4X4 16PLY XRAY LF (GAUZE/BANDAGES/DRESSINGS) IMPLANT
GLOVE BIOGEL PI IND STRL 8 (GLOVE) ×1 IMPLANT
GLOVE BIOGEL PI IND STRL 8.5 (GLOVE) ×1 IMPLANT
GLOVE BIOGEL PI INDICATOR 8 (GLOVE) ×1
GLOVE BIOGEL PI INDICATOR 8.5 (GLOVE) ×1
GLOVE ECLIPSE 7.5 STRL STRAW (GLOVE) ×6 IMPLANT
GLOVE ECLIPSE 8.5 STRL (GLOVE) ×2 IMPLANT
GLOVE EXAM NITRILE LRG STRL (GLOVE) IMPLANT
GLOVE EXAM NITRILE XL STR (GLOVE) IMPLANT
GLOVE EXAM NITRILE XS STR PU (GLOVE) IMPLANT
GLOVE SURG SS PI 8.0 STRL IVOR (GLOVE) ×2 IMPLANT
GOWN BRE IMP SLV AUR LG STRL (GOWN DISPOSABLE) ×2 IMPLANT
GOWN BRE IMP SLV AUR XL STRL (GOWN DISPOSABLE) ×4 IMPLANT
GOWN STRL REIN 2XL LVL4 (GOWN DISPOSABLE) ×2 IMPLANT
HEMOSTAT SURGICEL 2X14 (HEMOSTASIS) ×2 IMPLANT
KIT BASIN OR (CUSTOM PROCEDURE TRAY) ×2 IMPLANT
KIT ROOM TURNOVER OR (KITS) ×2 IMPLANT
MARKER SKIN DUAL TIP RULER LAB (MISCELLANEOUS) ×2 IMPLANT
NEEDLE HYPO 22GX1.5 SAFETY (NEEDLE) ×2 IMPLANT
NS IRRIG 1000ML POUR BTL (IV SOLUTION) ×4 IMPLANT
PACK CRANIOTOMY (CUSTOM PROCEDURE TRAY) ×2 IMPLANT
PAD ARMBOARD 7.5X6 YLW CONV (MISCELLANEOUS) ×6 IMPLANT
PATTIES SURGICAL .25X.25 (GAUZE/BANDAGES/DRESSINGS) IMPLANT
PATTIES SURGICAL .5 X.5 (GAUZE/BANDAGES/DRESSINGS) IMPLANT
PATTIES SURGICAL .5 X3 (DISPOSABLE) IMPLANT
PATTIES SURGICAL .75X.75 (GAUZE/BANDAGES/DRESSINGS) IMPLANT
PATTIES SURGICAL 1X1 (DISPOSABLE) IMPLANT
PERFORATOR LRG  14-11MM (BIT) ×1
PERFORATOR LRG 14-11MM (BIT) ×1 IMPLANT
PLATE 1.5  2HOLE LNG NEURO (Plate) ×1 IMPLANT
PLATE 1.5 2HOLE LNG NEURO (Plate) ×1 IMPLANT
PLATE 1.5/0.5 13MM BURR HOLE (Plate) ×4 IMPLANT
RUBBERBAND STERILE (MISCELLANEOUS) ×4 IMPLANT
SCREW SELF DRILL HT 1.5/4MM (Screw) ×18 IMPLANT
SPECIMEN JAR SMALL (MISCELLANEOUS) ×2 IMPLANT
SPONGE GAUZE 4X4 12PLY (GAUZE/BANDAGES/DRESSINGS) IMPLANT
SPONGE LAP 18X18 X RAY DECT (DISPOSABLE) ×2 IMPLANT
SPONGE NEURO XRAY DETECT 1X3 (DISPOSABLE) IMPLANT
SPONGE SURGIFOAM ABS GEL 100 (HEMOSTASIS) ×2 IMPLANT
STAPLER SKIN PROX WIDE 3.9 (STAPLE) ×4 IMPLANT
SUT ETHILON 2 0 FS 18 (SUTURE) ×4 IMPLANT
SUT ETHILON 3 0 FSL (SUTURE) IMPLANT
SUT NURALON 4 0 TR CR/8 (SUTURE) ×2 IMPLANT
SUT VIC AB 2-0 OS6 18 (SUTURE) ×16 IMPLANT
SUT VIC AB 3-0 CP2 18 (SUTURE) ×2 IMPLANT
SWABSTICK BENZOIN STERILE (MISCELLANEOUS) IMPLANT
SYR 20ML ECCENTRIC (SYRINGE) ×2 IMPLANT
SYR CONTROL 10ML LL (SYRINGE) ×2 IMPLANT
TOWEL OR 17X24 6PK STRL BLUE (TOWEL DISPOSABLE) ×2 IMPLANT
TOWEL OR 17X26 10 PK STRL BLUE (TOWEL DISPOSABLE) ×2 IMPLANT
TRAY FOLEY CATH 14FRSI W/METER (CATHETERS) ×2 IMPLANT
TUBE CONNECTING 12X1/4 (SUCTIONS) IMPLANT
UNDERPAD 30X30 INCONTINENT (UNDERPADS AND DIAPERS) ×2 IMPLANT
WATER STERILE IRR 1000ML POUR (IV SOLUTION) ×2 IMPLANT

## 2012-06-29 NOTE — Anesthesia Postprocedure Evaluation (Signed)
  Anesthesia Post-op Note  Patient: Molly Patel  Procedure(s) Performed: Procedure(s) (LRB) with comments: CRANIOTOMY TUMOR EXCISION (Left) - Left frontal Craniotomy for Meningioma  Patient Location: PACU  Anesthesia Type:General  Level of Consciousness: awake, alert  and oriented  Airway and Oxygen Therapy: Patient Spontanous Breathing and Patient connected to nasal cannula oxygen  Post-op Pain: mild  Post-op Assessment: Post-op Vital signs reviewed and Patient's Cardiovascular Status Stable  Post-op Vital Signs: stable  Complications: No apparent anesthesia complications

## 2012-06-29 NOTE — Progress Notes (Signed)
ANTIBIOTIC CONSULT NOTE - INITIAL  Pharmacy Consult for Vancomycin Indication: post-op prophylaxis x 24 hrs  No Known Allergies  Patient Measurements: Height: 5\' 7"  (170.2 cm) Weight: 188 lb 1.9 oz (85.33 kg) IBW/kg (Calculated) : 61.6   Vital Signs: Temp: 97.4 F (36.3 C) (11/05 1500) Temp src: Oral (11/05 1500) BP: 137/77 mmHg (11/05 1500) Pulse Rate: 76  (11/05 1300)  Pre-op labs from 06/15/12:  Creatinine 0.82  WBC 8.4  K+ 3.3  Estimated Creatinine Clearance: 73.7 ml/min (by C-G formula based on Cr of 0.82).  Microbiology: Recent Results (from the past 720 hour(s))  WOUND CULTURE     Status: Normal   Collection Time   06/24/12  4:37 PM      Component Value Range Status Comment   Culture     Final    Value: Moderate METHICILLIN RESISTANT STAPHYLOCOCCUS AUREUS   GRAM STAIN Rare   Final    GRAM STAIN WBC present-both PMN and Mononuclear   Final    GRAM STAIN No Squamous Epithelial Cells Seen   Final    GRAM STAIN No Organisms Seen   Final    Organism ID, Bacteria METHICILLIN RESISTANT STAPHYLOCOCCUS AUREUS   Final   SURGICAL PCR SCREEN     Status: Normal   Collection Time   06/25/12  1:56 PM      Component Value Range Status Comment   MRSA, PCR NEGATIVE  NEGATIVE Final    Staphylococcus aureus NEGATIVE  NEGATIVE Final     Medical History: Past Medical History  Diagnosis Date  . Incontinence   . Hyperlipidemia   . Insomnia   . Syncope   . Angioedema   . Echocardiogram with ECG monitoring july 2012    mild LVH, normal event monitor   . Ovarian cyst   . Vitamin D deficiency   . Hypertension     takes meds daily  . Depression   . Seizures     2 weeks ago  . Dizziness    Assessment:  s/p resection of left frontal meningioma.  Vancomycin 1 gram IV given pre-op at 9:54am.  For 24-hrs post-op coverage with Vancomycin.   Of note, 06/24/12 wound/abscess culture grew MRSA.  Boil on left labia majora and in inguinal area I&D'd in the office that day.  MIC to  Vancomycin = 1. Also sensitive to Septra.  Had been on Keflex, but changed to Septra DS on 10/31, which was to end on 06/28/12.  Goal of Therapy:  Vancomycin trough level 10-15 mcg/ml  Plan:   Vancomycin 1 gram IV q12hrs x 2 doses. Last dose due 11/6 at 10am.  Will follow-up to be sure course does not need to be extended.  Dennie Fetters, [place] Pager: [phone] 06/29/2012,4:03 PM

## 2012-06-29 NOTE — Progress Notes (Signed)
Call to dr. joslin for sign out 

## 2012-06-29 NOTE — Progress Notes (Signed)
Noted abnormal lab to be repeated /Bmet

## 2012-06-29 NOTE — Progress Notes (Signed)
Pt dsg reinforced  Left side more drainage noted , lab result hgb 6.5 reported to Dr. Gerlene Fee he wanted lab trepeated and called to MD on call if abnormal

## 2012-06-29 NOTE — Anesthesia Preprocedure Evaluation (Addendum)
Anesthesia Evaluation  Patient identified by MRN, date of birth, ID band Patient awake    Reviewed: Allergy & Precautions, H&P , NPO status , Patient's Chart, lab work & pertinent test results, reviewed documented beta blocker date and time   Airway Mallampati: II TM Distance: >3 FB Neck ROM: Full    Dental  (+) Teeth Intact and Dental Advisory Given   Pulmonary shortness of breath and with exertion,  breath sounds clear to auscultation        Cardiovascular hypertension, Pt. on home beta blockers Rhythm:Regular Rate:Normal     Neuro/Psych Seizures -,  PSYCHIATRIC DISORDERS Depression    GI/Hepatic   Endo/Other    Renal/GU      Musculoskeletal   Abdominal   Peds  Hematology   Anesthesia Other Findings   Reproductive/Obstetrics                          Anesthesia Physical Anesthesia Plan  ASA: III  Anesthesia Plan: General   Post-op Pain Management:    Induction: Intravenous  Airway Management Planned: Oral ETT  Additional Equipment: Arterial line and CVP  Intra-op Plan:   Post-operative Plan: Extubation in OR  Informed Consent: I have reviewed the patients History and Physical, chart, labs and discussed the procedure including the risks, benefits and alternatives for the proposed anesthesia with the patient or authorized representative who has indicated his/her understanding and acceptance.   Dental advisory given  Plan Discussed with: CRNA and Surgeon  Anesthesia Plan Comments: (L. Frontal mengioma with seizures Htn)        Anesthesia Quick Evaluation

## 2012-06-29 NOTE — Progress Notes (Signed)
CALL TO DR. Noreene Larsson TO PUT IN THE ALINE INFORMATION IN THE LDAS

## 2012-06-29 NOTE — Progress Notes (Signed)
Repeat HGB 14.0

## 2012-06-29 NOTE — Op Note (Signed)
Preop diagnosis: Left frontal mass Postop diagnosis: Left frontal meningioma Procedure: Left frontal craniotomy with resection of meningioma Surgeon: Armelia Penton Assistant: Pool  After and placed the supine position in 3-point pin fixation the patient's frontal scalp was shaved prepped and draped in usual sterile fashion. Bicoronal incision was formed: Lower on the left side in the right. Hemostasis was obtained with unipolar coagulation and the flap was reflected anteriorly staying superficial to the temporal fascia bilaterally. Towel clip rubber bands and Dallas clamps were used to retract the flap. A 4 hole craniotomy was performed in the low left frontal region. The holes were connected with a high-speed drill the dura was separated with a Penfield 3. The bone flap was removed without difficulty and any bleeding on the dura was controlled with upper coagulation. We then opened the dura and reflected in the inferior medial direction. It was a very thin layer of cortex over the tumor which was removed with bipolar coagulation and suction. The tumor was then easily identifiable it was dissected free of the surrounding brain without difficulty. We dissected the superior and lateral ask aspect of the first and then went towards the medial direction began to peel the tumor off of the falx. We then went in the inferior direction until we could visualize the frontal floor and the tumor was reflected in that direction. It was an area of hyperostotic bone that was quite adherent to the tumor this was removed with the Kerrison punch. At this time remove the last remaining tumor off of the fall and could see no residual neoplasm. The tumor was sent for pathology and frozen section returned as a typical meningioma. We then irrigated controlled any bleeding with upper coagulation. We placed Surgicel stamps over the tumor bed. We placed a piece of DuraGen over the dural exposure. We reattached the bone plate with 3 Lorenz  plates with a 20 frontal once been bur hole cover type. We then closed the wound with inverted Vicryl on the galea and staples on the skin. A shortness was then applied and the patient was extubated and taken to recovery room in stable condition.

## 2012-06-29 NOTE — H&P (Signed)
Molly Patel is an 66 y.o. female.   Chief Complaint: Left frontal mass HPI: The patient is a 66 year old female who was having some episodes of syncopal type symptoms. She mention this to her primary medical Dr. arranged an MRI scan of her brain. The showed a mass in the left frontal region with significant surrounding edema and mass effect. The patient was sent for evaluation and it was felt that this was most likely a meningioma. After discussing the options and due to the significant mass effect present the patient now requests surgery and comes for craniotomy for removal. I had a long discussion with her regarding the risks and benefits of surgical intervention. The risks discussed include but are not limited to bleeding infection weakness numbness paralysis seizures coma and death. We have discussed alternative methods of therapy offered risks and benefits of nonintervention. She has had the opportunity to ask numerous questions and appears to understand. With this information in hand she has requested that we proceed with surgery.  Past Medical History  Diagnosis Date  . Incontinence   . Hyperlipidemia   . Insomnia   . Syncope   . Angioedema   . Echocardiogram with ECG monitoring july 2012    mild LVH, normal event monitor   . Ovarian cyst   . Vitamin D deficiency   . Hypertension     takes meds daily  . Depression   . Seizures     2 weeks ago  . Dizziness     Past Surgical History  Procedure Date  . Vesicovaginal fistula closure w/ tah   . Abdominal hysterectomy     Family History  Problem Relation Age of Onset  . Asthma Grandchild   . Arthritis Sister   . Diabetes Sister   . Colon cancer Brother   . Stroke Father    Social History:  reports that she quit smoking about 26 years ago. Her smoking use included Cigarettes. She quit after 7 years of use. She has never used smokeless tobacco. She reports that she does not drink alcohol or use illicit drugs.  Allergies:  No Known Allergies  Medications Prior to Admission  Medication Sig Dispense Refill  . atenolol (TENORMIN) 50 MG tablet Take 1 tablet (50 mg total) by mouth daily.  90 tablet  1  . Calcium Carbonate-Vitamin D (CALCIUM + D) 600-200 MG-UNIT TABS Take by mouth.      . dexamethasone (DECADRON) 4 MG tablet Take 1 tablet (4 mg total) by mouth 2 (two) times daily with a meal.  12 tablet  0  . hydrochlorothiazide (HYDRODIURIL) 25 MG tablet Take 1 tablet (25 mg total) by mouth daily.  90 tablet  1  . phenytoin (DILANTIN) 100 MG ER capsule Take 200 mg by mouth 2 (two) times daily.      . Red Yeast Rice Extract (RED YEAST RICE PO) Take 1 capsule by mouth 2 (two) times daily.       . vitamin C (ASCORBIC ACID) 500 MG tablet Take 500 mg by mouth daily.      Marland Kitchen VITAMIN E PO Take 1 capsule by mouth daily.      . [EXPIRED] sulfamethoxazole-trimethoprim (SEPTRA DS) 800-160 MG per tablet Take 1 tablet by mouth 2 (two) times daily.  10 tablet  0  . [EXPIRED] sulfamethoxazole-trimethoprim (SEPTRA DS) 800-160 MG per tablet Take 1 tablet by mouth 2 (two) times daily.  10 tablet  0    No results found for this or any  previous visit (from the past 48 hour(s)). No results found.  A comprehensive review of systems was negative.  Blood pressure 120/72, pulse 56, temperature 97.2 F (36.2 C), temperature source Oral, resp. rate 20, SpO2 100.00%.  The patient is awake alert and oriented. She is no facial asymmetry. Her gait is nonantalgic. Reflexes are decreased but equal. She is normal donor function. She has a mild drift of a right upper extremity. Her mental status exam is normal. Assessment/Plan Impression is that of a left frontal mass with surrounding edema most consistent with a meningioma. The plan is for a left frontal craniotomy with removal of the tumor.  Reinaldo Meeker, MD 06/29/2012, 9:16 AM

## 2012-06-29 NOTE — Transfer of Care (Signed)
Immediate Anesthesia Transfer of Care Note  Patient: Molly Patel  Procedure(s) Performed: Procedure(s) (LRB) with comments: CRANIOTOMY TUMOR EXCISION (Left) - Left frontal Craniotomy for Meningioma  Patient Location: PACU  Anesthesia Type:General  Level of Consciousness: awake, alert  and patient cooperative  Airway & Oxygen Therapy: Patient Spontanous Breathing and Patient connected to face mask oxygen  Post-op Assessment: Report given to PACU RN, Post -op Vital signs reviewed and stable and Patient moving all extremities  Post vital signs: Reviewed and stable  Complications: No apparent anesthesia complications

## 2012-06-30 ENCOUNTER — Encounter (HOSPITAL_COMMUNITY): Payer: Self-pay | Admitting: Radiology

## 2012-06-30 ENCOUNTER — Inpatient Hospital Stay (HOSPITAL_COMMUNITY): Payer: Medicare PPO

## 2012-06-30 LAB — CBC
HCT: 39.4 % (ref 36.0–46.0)
MCH: 30.9 pg (ref 26.0–34.0)
MCV: 91.6 fL (ref 78.0–100.0)
Platelets: 229 10*3/uL (ref 150–400)
RDW: 14.6 % (ref 11.5–15.5)
WBC: 16.7 10*3/uL — ABNORMAL HIGH (ref 4.0–10.5)

## 2012-06-30 LAB — BASIC METABOLIC PANEL
CO2: 26 mEq/L (ref 19–32)
Calcium: 8.3 mg/dL — ABNORMAL LOW (ref 8.4–10.5)
Chloride: 101 mEq/L (ref 96–112)
Creatinine, Ser: 0.83 mg/dL (ref 0.50–1.10)
Glucose, Bld: 164 mg/dL — ABNORMAL HIGH (ref 70–99)

## 2012-06-30 LAB — TYPE AND SCREEN: ABO/RH(D): B POS

## 2012-06-30 MED ORDER — IOHEXOL 300 MG/ML  SOLN: 100.0000 mL | Freq: Once | INTRAMUSCULAR | Status: AC | PRN

## 2012-06-30 NOTE — Progress Notes (Signed)
Patient ID: Molly Patel, female   DOB: 04/11/1946, 66 y.o.   MRN: [id number] Subjective: Patient reports feeling great  Objective: Vital signs in last 24 hours: Temp:  [97.4 F (36.3 C)-99.1 F (37.3 C)] 98.2 F (36.8 C) (11/06 0700) Pulse Rate:  [70-90] 83  (11/06 0700) Resp:  [8-27] 20  (11/06 0700) BP: (131-151)/(59-80) 133/70 mmHg (11/06 0700) SpO2:  [97 %-100 %] 98 % (11/06 0700) Weight:  [85.33 kg (188 lb 1.9 oz)] 85.33 kg (188 lb 1.9 oz) (11/05 1500)  Intake/Output from previous day: 11/05 0701 - 11/06 0700 In: 4025 [I.V.:3575; IV Piggyback:450] Out: 4485 [Urine:4185; Blood:300] Intake/Output this shift:    Awake, alert. No focal deficit. Speech fluent and appropriate.  Lab Results:  Basename 06/30/12 0500 06/29/12 1635  WBC 16.7* 12.8*  HGB 13.3 14.0  HCT 39.4 41.2  PLT 229 216   BMET  Basename 06/30/12 0500 06/29/12 1718  NA 137 134*  K 4.0 4.1  CL 101 101  CO2 26 28  GLUCOSE 164* 143*  BUN 7 30*  CREATININE 0.83 0.73  CALCIUM 8.3* 8.1*    Studies/Results: Ct Head W Wo Contrast  06/30/2012  *RADIOLOGY REPORT*  Clinical Data: Status post resection of meningioma; postoperative CT.  CT HEAD WITHOUT AND WITH CONTRAST  Technique:  Contiguous axial images were obtained from the base of the skull through the vertex without and with intravenous contrast.  Contrast:  100 mL of Omnipaque 300 IV contrast  Comparison: MRI of the brain performed 06/15/2012  Findings: There has been interval resection of the previously noted meningioma at the left frontal lobe.  No residual enhancing tumor is identified.  Note is made of trace blood overlying the left frontal lobe, with overlying packing material.  A very large region of edema is again noted involving the left frontal lobe, left basal ganglia and portions of the right frontal lobe; the appearance is grossly unchanged from the prior MRI. Approximately 8 mm of rightward midline shift is noted anteriorly; this is improved  from the prior MRI.  The posterior fossa, including the cerebellum, brainstem and fourth ventricle, is within normal limits.  Mass effect on the lateral ventricles is grossly unchanged in appearance, reflecting underlying edema.  There is no evidence of fracture; the patient's left frontal craniotomy flap appears normally aligned.  The visualized portions of the orbits are within normal limits.  The paranasal sinuses and mastoid air cells are well-aerated.  Soft tissue swelling is noted along the left side of the head.  IMPRESSION:  1.  Status post resection of previously noted meningioma at the left frontal lobe.  No residual enhancing tumor identified.  Trace blood noted overlying the left frontal lobe, with overlying packing material. 2.  Stable appearance to very large region of edema involving the left frontal lobe, left basal ganglia and portions of the right frontal lobe.  8 mm of rightward midline shift anteriorly, improved from the prior MRI.  Mass effect again noted on the lateral ventricles.   Original Report Authenticated By: Tonia Ghent, M.D.    Dg Chest Port 1 View  06/29/2012  *RADIOLOGY REPORT*  Clinical Data: Post craniotomy, new right jugular line placement  PORTABLE CHEST - 1 VIEW  Comparison: Portable exam 1415 hours compared to 06/25/2012  Findings: Right jugular central venous catheter tip projecting over SVC. Upper-normal size of cardiac silhouette. Mildly elongated thoracic aorta. Pulmonary vascularity normal. Lungs grossly clear. No pleural effusion or pneumothorax.  IMPRESSION: No acute abnormalities.  Original Report Authenticated By: Ulyses Southward, M.D.     Assessment/Plan: Doing great. CT this am looks excellent. Will increase activity. Transfer to floor toimorrow.  LOS: 1 day  as above   Reinaldo Meeker, MD 06/30/2012, 8:48 AM

## 2012-06-30 NOTE — Progress Notes (Signed)
Ambulated several times to BR, and once around the unit pod. Tolerated well. Pt request to return to bed. Pt in bed resting call bell in reach. No needs expressed

## 2012-07-01 MED ORDER — PANTOPRAZOLE SODIUM 40 MG PO TBEC
40.0000 mg | DELAYED_RELEASE_TABLET | Freq: Every day | ORAL | Status: DC
  Administered 2012-07-01 – 2012-07-02 (×2): 40 mg via ORAL
  Filled 2012-07-01: qty 1

## 2012-07-01 NOTE — Progress Notes (Signed)
UR COMPLETED  

## 2012-07-01 NOTE — Progress Notes (Signed)
Visited pt while rounding in 3100. Pt said she was doing well and was quick to give credit to God for her surgery going so well. Pt talked about how well she has been cared for by her nurse and by others at [facility]. Pt expressed appreciation for chaplain's visit.

## 2012-07-01 NOTE — Progress Notes (Signed)
Patient ID: Molly Patel, female   DOB: 12/20/1945, 66 y.o.   MRN: [id number] Doing great. Neuro stable. Wound clean and dry. Mild eye swelling. Will transfer to floor, and hopefully d/c tomorrow.

## 2012-07-02 MED ORDER — DEXAMETHASONE 0.75 MG PO TABS: 2.0000 mg | ORAL_TABLET | Freq: Three times a day (TID) | ORAL | Status: DC

## 2012-07-02 NOTE — Discharge Summary (Signed)
Physician Discharge Summary  Patient ID: Molly Patel MRN: [id number] DOB/AGE: 1946-08-24 66 y.o.  Admit date: 06/29/2012 Discharge date: 07/02/2012  Admission Diagnoses:  Discharge Diagnoses:  Active Problems:  * No active hospital problems. *    Discharged Condition: good  Hospital Course: Surgery Tuesday for meningioma. Did well. No problems post op. By pod 3, doing well. Ambulating fine, wound fine. No neuro issues. Home with specific instructions given.  Consults: None  Significant Diagnostic Studies: none   Treatments: surgery: Left frontal crani for tumor  Discharge Exam: Blood pressure 129/85, pulse 85, temperature 97.7 F (36.5 C), temperature source Oral, resp. rate 20, height 5\' 7"  (1.702 m), weight 86.8 kg (191 lb 5.8 oz), SpO2 100.00%. Incision/Wound:clean and dry  Disposition: 01-Home or Self Care  Discharge Orders    Future Appointments: Provider: Department: Dept Phone: Center:   09/14/2012 3:45 PM Salley Scarlet, MD [facility] [phone] [facility]     Future Orders Please Complete By Expires   Diet general      Discharge instructions      Comments:   Mostly bedrest. Get up 9 or 10 times each day and walk for 15-20 minutes each time. Very little sitting the first week. No riding in the car until your first post op appointment. If you had neck surgery...may shower from the chest down. If you had low back surgery....you may shower with a saran wrap covering over the incision. Take your pain medicine as needed...and other medicines that you are instructed to take. Call for an appointment...[phone].   Call MD for:  temperature >100.4      Call MD for:  persistant nausea and vomiting      Call MD for:  severe uncontrolled pain      Call MD for:  redness, tenderness, or signs of infection (pain, swelling, redness, odor or green/yellow discharge around incision site)      Call MD for:  difficulty breathing, headache or visual disturbances      Call  MD for:  hives          Medication List     As of 07/02/2012 12:24 PM    STOP taking these medications         sulfamethoxazole-trimethoprim 800-160 MG per tablet   Commonly known as: BACTRIM DS,SEPTRA DS      TAKE these medications         atenolol 50 MG tablet   Commonly known as: TENORMIN   Take 1 tablet (50 mg total) by mouth daily.      Calcium + D 600-200 MG-UNIT Tabs   Generic drug: Calcium Carbonate-Vitamin D   Take by mouth.      dexamethasone 4 MG tablet   Commonly known as: DECADRON   Take 1 tablet (4 mg total) by mouth 2 (two) times daily with a meal.      dexamethasone 0.75 MG tablet   Commonly known as: DECADRON   Take 2.5 tablets (1.875 mg total) by mouth 3 (three) times daily.      hydrochlorothiazide 25 MG tablet   Commonly known as: HYDRODIURIL   Take 1 tablet (25 mg total) by mouth daily.      phenytoin 100 MG ER capsule   Commonly known as: DILANTIN   Take 200 mg by mouth 2 (two) times daily.      RED YEAST RICE PO   Take 1 capsule by mouth 2 (two) times daily.      vitamin C  500 MG tablet   Commonly known as: ASCORBIC ACID   Take 500 mg by mouth daily.      VITAMIN E PO   Take 1 capsule by mouth daily.         At home rest most of the time. Get up 9 or 10 times each day and take a 15 or 20 minute walk. No riding in the car and to your first postoperative appointment. If you have neck surgery you may shower from the chest down starting on the third postoperative day. If you had back surgery he may start showering on the third postoperative day with saran wrap wrapped around your incisional area 3 times. After the shower remove the saran wrap. Take pain medicine as needed and other medications as instructed. Call my office for an appointment.  SignedReinaldo Meeker, MD 07/02/2012, 12:24 PM

## 2012-07-16 ENCOUNTER — Telehealth: Payer: Self-pay | Admitting: Family Medicine

## 2012-07-16 MED ORDER — ZOLPIDEM TARTRATE 5 MG PO TABS: 5.0000 mg | ORAL_TABLET | Freq: Every evening | ORAL | Status: DC | PRN

## 2012-07-16 NOTE — Telephone Encounter (Signed)
States that since she was discharged from the hospital she has been unable to sleep.  Sister has given her tylenol pm and benadryl with no help.  Only sleeps 2 hours a night.  Please advise.

## 2012-07-16 NOTE — Telephone Encounter (Signed)
Ambien sent for short term use

## 2012-07-16 NOTE — Addendum Note (Signed)
Addended by: Milinda Antis F on: 07/16/2012 12:50 PM   Modules accepted: Orders

## 2012-07-19 ENCOUNTER — Telehealth: Payer: Self-pay | Admitting: Family Medicine

## 2012-07-19 NOTE — Telephone Encounter (Signed)
This med was refaxed this am

## 2012-08-02 ENCOUNTER — Other Ambulatory Visit: Payer: Self-pay | Admitting: Neurosurgery

## 2012-08-02 DIAGNOSIS — M436 Torticollis: Secondary | ICD-10-CM

## 2012-08-12 ENCOUNTER — Ambulatory Visit
Admission: RE | Admit: 2012-08-12 | Discharge: 2012-08-12 | Disposition: A | Discharge status: Home / Self Care | Payer: Medicare PPO | Source: Ambulatory Visit | Attending: Neurosurgery | Admitting: Neurosurgery

## 2012-08-12 DIAGNOSIS — M436 Torticollis: Secondary | ICD-10-CM

## 2012-09-01 ENCOUNTER — Encounter

## 2012-09-03 LAB — METABOLIC PANEL, COMPREHENSIVE
A-G Ratio: 0.9 — ABNORMAL LOW (ref 1.2–3.5)
ALT (SGPT): 17 U/L (ref 12–65)
AST (SGOT): 23 U/L (ref 15–37)
Albumin: 3.6 g/dL (ref 3.2–4.6)
Alk. phosphatase: 97 U/L (ref 50–136)
Anion gap: 3 mmol/L — ABNORMAL LOW (ref 7–16)
BUN: 15 MG/DL (ref 8–23)
Bilirubin, total: 0.3 MG/DL (ref 0.2–1.1)
CO2: 31 MMOL/L (ref 21–32)
Calcium: 9 MG/DL (ref 8.3–10.4)
Chloride: 105 MMOL/L (ref 98–107)
Creatinine: 0.46 MG/DL — ABNORMAL LOW (ref 0.6–1.0)
GFR est AA: >60 mL/min/{1.73_m2} (ref >60)
GFR est non-AA: >60 mL/min/{1.73_m2} (ref >60)
Globulin: 3.9 g/dL — ABNORMAL HIGH (ref 2.3–3.5)
Glucose: 97 MG/DL (ref 65–100)
Potassium: 4.5 MMOL/L (ref 3.5–5.1)
Protein, total: 7.5 g/dL (ref 6.3–8.2)
Sodium: 139 MMOL/L (ref 136–145)

## 2012-09-03 LAB — CBC W/O DIFF
HCT: 44.6 % (ref 35.8–46.3)
HGB: 14.2 g/dL (ref 11.7–15.4)
MCH: 28.4 PG (ref 26.1–32.9)
MCHC: 31.8 g/dL (ref 31.4–35.0)
MCV: 89.2 FL (ref 79.6–97.8)
MPV: 10.8 FL (ref 10.8–14.1)
PLATELET: 251 10*3/uL (ref 150–450)
RBC: 5 M/uL (ref 4.05–5.25)
RDW: 14.4 % (ref 11.9–14.6)
WBC: 8.3 10*3/uL (ref 4.3–11.1)

## 2012-09-03 LAB — URINALYSIS W/ RFLX MICROSCOPIC
Bacteria: 0 /HPF
Bilirubin: NEGATIVE
Glucose: NEGATIVE MG/DL
Ketone: NEGATIVE MG/DL
Nitrites: NEGATIVE
Protein: 100 MG/DL — AB
RBC: >100 /HPF — ABNORMAL HIGH
Specific gravity: 1.019 (ref 1.001–1.023)
Urobilinogen: 0.2 EU/DL (ref 0.2–1.0)
pH (UA): 8 (ref 5.0–9.0)

## 2012-09-03 MED ADMIN — diatrizoate meglumine & sodium (MD-GASTROVIEW,GASTROGRAFIN) 66-10 % contrast solution 30 mL: ORAL | @ 20:00:00 | NDC 00019481604

## 2012-09-03 MED ADMIN — sodium chloride 0.9 % bolus infusion 100 mL: INTRAVENOUS | @ 20:00:00 | NDC 00409798423

## 2012-09-03 MED ADMIN — saline peripheral flush soln 10 mL: @ 20:00:00 | NDC 87701099893

## 2012-09-03 MED ADMIN — ioversol (OPTIRAY) 350 mg iodine/mL contrast solution 125 mL: INTRAVENOUS | @ 20:00:00 | NDC 00019133316

## 2012-09-03 NOTE — Other (Signed)
Patient's guide to surgery given along with Pt education sheets regarding transfusions, pain management,smoking,and hand hygiene for the family and community. Pt verbalizes understanding of all pre-op instructions . Reinforced nothing to eat or drink after midnight on the day prior to surgery , this includes gum, mints, ice chips or water. Instructed that family must be present in building at all times.    Instructed Patient that they will be notified the day prior to surgery ( or the Friday before if surgery is on a Monday) of their arrival time by pre-op nurse with  Verbal understanding.    Verbal and written Instructions given to patient to shower with antibacterial soap for 3 nights prior to surgery. Bar Soap provided at time of pre-assessment.    Shower from chin to toes ( except the genital area) and especially the surgical site area with hibiclens on the night before and the am of surgery. Allow hibiclens to sit on skin for at least 20 seconds and rinse well. 2 packets of hibiclens given for use. Wear freshly laundered clothing in to hospital, have clean bed linen on bed and do not allow pets to sleep or lay with them. Patient verbalized understanding of all of the above.      No audible murmur heard at pre-assessment.     Patient safety information sheet given per registration with numbers for patient relations: [phone] and patient safety office:[phone]. Instructed pt that for any family member to obtain information or updates on pt's condition that they must have 4 digit code provided to them at registration. Pt verbalized understanding.     Instructed patient to continue  previous medications as prescribed prior to surgery and  to take Baclofen, gabapentin, synthroid, ultram venlafaxine  On the Day of surgery with sip of water.  Written Medication report given and copy to chart. Do not hold any medications that you have not been instructed to do either at your pre-assessment or per your surgeon.     Continue all previous medications unless otherwise directed.      Instructed patient to stop the following medications prior to surgery: fish oil, nsaids    TYPE  1b   CASE   Labs per surgeon : cbc, cmp, urine, urine culture  Labs per grid : none  EKG  :  none

## 2012-09-05 LAB — CULTURE, URINE: Culture result:: 10000

## 2012-09-06 NOTE — Other (Signed)
Faxed abnormal UA to Dr Rozann Lesches for review--spoke with [facility]

## 2012-09-07 ENCOUNTER — Inpatient Hospital Stay: Payer: MEDICARE

## 2012-09-07 LAB — GLUCOSE, POC: Glucose (POC): 117 mg/dL — ABNORMAL HIGH (ref 65–100)

## 2012-09-07 MED ADMIN — docusate sodium (COLACE) capsule 100 mg: ORAL | @ 23:00:00 | NDC 57664011208

## 2012-09-07 MED ADMIN — ondansetron (ZOFRAN) injection: INTRAVENOUS | @ 18:00:00 | NDC 00703722101

## 2012-09-07 MED ADMIN — fentaNYL citrate (PF) injection 50 mcg: INTRAVENOUS | @ 17:00:00 | NDC 68258301001

## 2012-09-07 MED ADMIN — midazolam (VERSED) injection 2 mg: INTRAVENOUS | @ 17:00:00 | NDC 63323041112

## 2012-09-07 MED ADMIN — gabapentin (NEURONTIN) capsule 300 mg: ORAL | @ 21:00:00 | NDC 68084056311

## 2012-09-07 MED ADMIN — labetalol (NORMODYNE;TRANDATE) injection: INTRAVENOUS | @ 18:00:00 | NDC 17478042020

## 2012-09-07 MED ADMIN — lactated ringers infusion: INTRAVENOUS | @ 17:00:00 | NDC 00409795309

## 2012-09-07 MED ADMIN — dexamethasone (DECADRON) 4 mg/mL injection: INTRAVENOUS | @ 18:00:00 | NDC 63323016501

## 2012-09-07 MED ADMIN — ciprofloxacin (CIPRO) 400 mg IVPB (premix): INTRAVENOUS | @ 18:00:00 | NDC 00085174102

## 2012-09-07 MED ADMIN — docusate sodium (COLACE) capsule 100 mg: ORAL | @ 21:00:00 | NDC 62584068311

## 2012-09-07 MED ADMIN — oxyCODONE IR (ROXICODONE) tablet 5 mg: ORAL | @ 20:00:00 | NDC 72162174902

## 2012-09-07 MED ADMIN — venlafaxine-SR (EFFEXOR-XR) capsule 150 mg: ORAL | @ 22:00:00 | NDC 82804003830

## 2012-09-07 MED ADMIN — HYDROmorphone (DILAUDID) injection 0.5 mg: INTRAVENOUS | @ 20:00:00 | NDC 00641012121

## 2012-09-07 MED ADMIN — propofol (DIPRIVAN) 10 mg/mL injection: INTRAVENOUS | @ 18:00:00 | NDC 63323027025

## 2012-09-07 MED ADMIN — fentaNYL citrate (PF) injection: INTRAVENOUS | @ 18:00:00 | NDC 10019003867

## 2012-09-07 MED ADMIN — ketorolac (TORADOL) injection 15 mg: INTRAVENOUS | @ 20:00:00 | NDC 00409228831

## 2012-09-07 MED ADMIN — ondansetron (ZOFRAN) injection 4 mg: INTRAVENOUS | @ 20:00:00 | NDC 24200015844

## 2012-09-07 MED ADMIN — lidocaine (PF) (XYLOCAINE) 20 mg/mL (2 %) injection: INTRAVENOUS | @ 18:00:00 | NDC 00409428202

## 2012-09-07 MED ADMIN — lactated ringers infusion: INTRAVENOUS | @ 20:00:00 | NDC 11845118709

## 2012-09-07 MED ADMIN — midazolam (VERSED) injection 2 mg: INTRAVENOUS | @ 17:00:00 | NDC 66758001802

## 2012-09-07 MED ADMIN — famotidine (PEPCID) tablet 20 mg: ORAL | @ 17:00:00 | NDC 68084017211

## 2012-09-07 MED ADMIN — lactated ringers infusion: INTRAVENOUS | @ 18:00:00 | NDC 00409795309

## 2012-09-07 MED ADMIN — gabapentin (NEURONTIN) capsule 300 mg: ORAL | @ 23:00:00 | NDC 76420004790

## 2012-09-07 MED ADMIN — acetaminophen (OFIRMEV) infusion: INTRAVENOUS | @ 18:00:00 | NDC 43825010201

## 2012-09-07 MED FILL — CIPRO 400 MG/200 ML IN DEXTROSE 5 % INTRAVENOUS PIGGYBACK: 400 mg/200 mL | INTRAVENOUS | Qty: 200

## 2012-09-07 MED FILL — LACTATED RINGERS IV: INTRAVENOUS | Qty: 1000

## 2012-09-07 MED FILL — OFIRMEV 1,000 MG/100 ML (10 MG/ML) INTRAVENOUS SOLUTION: 1000 mg/100 mL (10 mg/mL) | INTRAVENOUS | Qty: 100

## 2012-09-07 MED FILL — GABAPENTIN 300 MG CAP: 300 mg | ORAL | Qty: 1

## 2012-09-07 MED FILL — HYDROMORPHONE 2 MG/ML INJECTION SOLUTION: 2 mg/mL | INTRAMUSCULAR | Qty: 1

## 2012-09-07 MED FILL — MIDAZOLAM 1 MG/ML IJ SOLN: 1 mg/mL | INTRAMUSCULAR | Qty: 2

## 2012-09-07 MED FILL — SODIUM CHLORIDE 0.9 % IV: INTRAVENOUS | Qty: 1000

## 2012-09-07 MED FILL — NALBUPHINE 10 MG/ML INJECTION: 10 mg/mL | INTRAMUSCULAR | Qty: 1

## 2012-09-07 MED FILL — FAMOTIDINE 20 MG TAB: 20 mg | ORAL | Qty: 1

## 2012-09-07 MED FILL — DOCUSATE SODIUM 100 MG CAP: 100 mg | ORAL | Qty: 1

## 2012-09-07 NOTE — Anesthesia Pre-Procedure Evaluation (Signed)
Anesthetic History   No history of anesthetic complications           Review of Systems / Medical History  Patient summary reviewed, nursing notes reviewed and pertinent labs reviewed    Pulmonary  Within defined limits               Neuro/Psych   Within defined limits           Cardiovascular  Within defined limits              Exercise tolerance: >4 METS     GI/Hepatic/Renal  Within defined limits                Endo/Other        Arthritis     Other Findings            Physical Exam    Airway  Mallampati: I  TM Distance: 4 - 6 cm  Neck ROM: normal range of motion   Mouth opening: Normal     Cardiovascular  Regular rate and rhythm,  S1 and S2 normal,  no murmur, click, rub, or gallop  Rhythm: regular  Rate: normal         Dental    Dentition: Edentulous     Pulmonary  Breath sounds clear to auscultation               Abdominal  GI exam deferred       Other Findings            Anesthetic Plan    ASA: 2  Anesthesia type: general          Induction: Intravenous  Anesthetic plan and risks discussed with: Patient and Spouse

## 2012-09-07 NOTE — Progress Notes (Signed)
Admitted to 632 assessment as charted foley with gu patent with clear urine

## 2012-09-07 NOTE — Progress Notes (Signed)
Patient resting in bed. Alert and able to make needs known. Speech is clear. Assessment completed and documented. NAD noted. Will continue to monitor.

## 2012-09-07 NOTE — Other (Signed)
TRANSFER - OUT REPORT:    Verbal report given to Jeneen Montgomery, RN on Cathy Jackson  being transferred to 632 for routine post - op       Report consisted of patient???s Situation, Background, Assessment and   Recommendations(SBAR).     Information from the following report(s) SBAR, OR Summary and MAR was reviewed with the receiving nurse.    Opportunity for questions and clarification was provided.

## 2012-09-07 NOTE — Brief Op Note (Signed)
BRIEF OPERATIVE NOTE    Date of Procedure: 09/07/2012   Preoperative Diagnosis: BLADDER CA  Postoperative Diagnosis: Same  Procedure(s):  CYSTOSCOPY TRANSURETHRAL RESECTION OF BLADDER TUMOR  Surgeon(s) and Role:     * Edon Hoadley P Marialuiza Car, DO - Primary  Anesthesia: General   Estimated Blood Loss: 25cc  Specimens: bladder tumor   Findings: see op note  Complications: None  Implants: * No implants in log *

## 2012-09-07 NOTE — Anesthesia Post-Procedure Evaluation (Signed)
Post-Anesthesia Evaluation and Assessment    Patient: Cathy Jackson MRN: [id number]  SSN: [id number]    Date of Birth: 12/16/1945  Age: 67 y.o.  Sex: female       Cardiovascular Function/Vital Signs  Visit Vitals   Item Reading   ??? BP 142/77   ??? Pulse 75   ??? Temp 36.5 ??C (97.7 ??F)   ??? Resp 16   ??? SpO2 99%       Patient is status post General anesthesia for Procedure(s):  CYSTOSCOPY TRANSURETHRAL RESECTION OF BLADDER TUMOR.    Nausea/Vomiting: None    Postoperative hydration reviewed and adequate.    Pain:  Pain Scale 1: Numeric (0 - 10) (09/07/12 1112)  Pain Intensity 1: 0 (09/07/12 1112)   Managed    Neurological Status:   Neuro (WDL): Within Defined Limits (09/07/12 1114)   At baseline    Mental Status and Level of Consciousness: Alert and oriented     Pulmonary Status:   O2 Device: Nasal cannula (09/07/12 1336)   Adequate oxygenation and airway patent    Complications related to anesthesia: None    Post-anesthesia assessment completed. No concerns    Signed By: Waymond Cera, MD     September 07, 2012

## 2012-09-08 MED ADMIN — baclofen (LIORESAL) tablet 20 mg: ORAL | @ 15:00:00 | NDC 68084059911

## 2012-09-08 MED ADMIN — baclofen (LIORESAL) tablet 20 mg: ORAL | NDC 68084059911

## 2012-09-08 MED ADMIN — docusate sodium (COLACE) capsule 100 mg: ORAL | @ 15:00:00 | NDC 62584068311

## 2012-09-08 MED ADMIN — venlafaxine-SR (EFFEXOR-XR) capsule 150 mg: ORAL | @ 15:00:00 | NDC 68084048611

## 2012-09-08 MED ADMIN — montelukast (SINGULAIR) tablet 10 mg: ORAL | @ 02:00:00 | NDC 68084062011

## 2012-09-08 MED ADMIN — levothyroxine (SYNTHROID) tablet 25 mcg: ORAL | @ 11:00:00 | NDC 00074455211

## 2012-09-08 MED ADMIN — 0.9% sodium chloride infusion: INTRAVENOUS | @ 02:00:00 | NDC 00409798309

## 2012-09-08 MED ADMIN — gabapentin (NEURONTIN) capsule 300 mg: ORAL | @ 15:00:00 | NDC 68084056311

## 2012-09-08 MED FILL — GABAPENTIN 300 MG CAP: 300 mg | ORAL | Qty: 1

## 2012-09-08 MED FILL — OFIRMEV 1,000 MG/100 ML (10 MG/ML) INTRAVENOUS SOLUTION: 1000 mg/100 mL (10 mg/mL) | INTRAVENOUS | Qty: 100

## 2012-09-08 MED FILL — LIDOCAINE (PF) 20 MG/ML (2 %) IJ SOLN: 20 mg/mL (2 %) | INTRAMUSCULAR | Qty: 100

## 2012-09-08 MED FILL — LACTATED RINGERS IV: INTRAVENOUS | Qty: 1000

## 2012-09-08 MED FILL — ONDANSETRON (PF) 4 MG/2 ML INJECTION: 4 mg/2 mL | INTRAMUSCULAR | Qty: 2

## 2012-09-08 MED FILL — PROPOFOL 10 MG/ML IV EMUL: 10 mg/mL | INTRAVENOUS | Qty: 175

## 2012-09-08 MED FILL — VENLAFAXINE SR 150 MG 24 HR CAP: 150 mg | ORAL | Qty: 1

## 2012-09-08 MED FILL — DEXAMETHASONE SODIUM PHOSPHATE 10 MG/ML IJ SOLN: 10 mg/mL | INTRAMUSCULAR | Qty: 5

## 2012-09-08 MED FILL — FENTANYL CITRATE (PF) 50 MCG/ML IJ SOLN: 50 mcg/mL | INTRAMUSCULAR | Qty: 100

## 2012-09-08 MED FILL — SINGULAIR 10 MG TABLET: 10 mg | ORAL | Qty: 1

## 2012-09-08 MED FILL — CIPROFLOXACIN IN D5W 400 MG/200 ML IV PIGGY BACK: 400 mg/200 mL | INTRAVENOUS | Qty: 200

## 2012-09-08 MED FILL — BACLOFEN 10 MG TAB: 10 mg | ORAL | Qty: 2

## 2012-09-08 MED FILL — LABETALOL 5 MG/ML IV SOLN: 5 mg/mL | INTRAVENOUS | Qty: 5

## 2012-09-08 MED FILL — LEVOTHROID 50 MCG TABLET: 50 mcg | ORAL | Qty: 1

## 2012-09-08 MED FILL — DOCUSATE SODIUM 100 MG CAP: 100 mg | ORAL | Qty: 1

## 2012-09-08 NOTE — Progress Notes (Signed)
Received order to discharge pt. IV dc'd with tip intact, small pressure dressing applied and secured with tape. Discharge/medication instructions reviewed with pt, verbalized understanding. Discharge/medication paperwork, including prescription, given to pt to take home. Pt escorted to discharge area via wheelchair.

## 2012-09-08 NOTE — Progress Notes (Signed)
Doing well.  Pain controlled.  AVSS  Abd soft, NT, ND.  UOP dark amber on min CBI  S/P TURBT POD#1  Clamp CBI.  Remove Foley in one hr if clear.  Home after voiding trial.  RTO next wk.

## 2012-09-08 NOTE — Progress Notes (Signed)
Post op interview conducted for Systo TURBT on 09/07/12.  Patient had no nausea or vomiting and pain well controlled.   No anesthetic complications or problems noted.

## 2012-09-08 NOTE — Progress Notes (Signed)
Urine in foley catheter clear after being clamped x 1 hour, dc'd per Md order with balloon intact. Pt instructed to save urine in hat when able to void, verbalized understanding.

## 2012-09-08 NOTE — Op Note (Signed)
[facility]                            [address]                           [place], [place]. [address]                                [phone]                                OPERATIVE REPORT    NAME:  Mazi, Schuff                          MR:  [id number]  LOC:  [id number]            SEX:  F               ACCT:  [id number]  DOB:  [birth year]            AGE:  66              PT:  V  ADMIT:  09/07/2012          DSCH:                 MSV:        PREPROCEDURE DIAGNOSIS: Bladder cancer.    POSTPROCEDURE DIAGNOSIS: Bladder cancer with recurrence.    NAME OF PROCEDURE: Transurethral resection of bladder tumor (medium).    SURGEON: Carver Fila, DO    ANESTHESIA: General.    CLINICAL HISTORY: This is a 67 year old female recently discovered to  have superficial high-grade bladder cancer back On 11/27/2011 and again  on 03/31/2012. She did receive adjuvant BCG. Recent cystoscopy did show  recurrent right-sided bladder wall tumor. Cytology showed atypical cells  suspicious for malignancy and CT scan showed mild right hydronephrosis  with bladder tumors seen without obvious metastases. She presents today  for the above-mentioned procedure. All risks, benefits, and alternatives  to the procedure were discussed and she is willing to proceed at this  time.    DESCRIPTION OF OPERATIVE PROCEDURE: Patient consent was obtained. The  patient was brought back to the operating room at which time she was  placed in a supine position. After the uneventful induction of general  anesthesia, she was then placed in a dorsal lithotomy position. Her  genital area was prepped and draped and a sterile field applied.    A 24-French resectoscope was inserted into the urethra and advanced into  the bladder under direct visualization. Immediately inside the bladder  neck over the right lateral wall was a large, approximately 2-3 cm  bladder tumor. This was not a typical papillary tumor  but more of a solid  tumor. The bladder was noted to be extremely small and very friable. Some  early urothelial changes were seen on the posterior bladder wall, left  bladder wall and dome of the bladder. All of these areas were fulgurated.  Both ureteral orifices were seen in their orthotopic position. The tumor  appeared to be distal to the orifice on the right. I then began by  resecting the bladder tumor down to and through muscle. Care was taken  to  avoid injury to the right orifice. Following resection and evacuation of  all bladder tumor specimen, I then fulgurated the resection site. All  mucosal abnormalities were fulgurated. A 20-French catheter was then  placed to continuous bladder irrigation.    The patient tolerated the procedure well. I anticipate her recovery in  the hospital overnight.                Mashelle Busick P. Kyle Luppino, DO        A                This is an unverified document unless signed by physician.    TID:  [id number]                                      DT:  09/08/2012 12:07 P  JOB:  [id number]           DOC#:  [id number]           DD:  09/07/2012    cc:   Remi Deter P. Jamaia Brum, DO

## 2012-09-08 NOTE — Progress Notes (Signed)
Pt able to void 400 mls of pink-tinged urine after dc of foley catheter.

## 2012-09-14 ENCOUNTER — Ambulatory Visit (INDEPENDENT_AMBULATORY_CARE_PROVIDER_SITE_OTHER): Payer: Medicare PPO | Admitting: Family Medicine

## 2012-09-14 ENCOUNTER — Encounter: Payer: Self-pay | Admitting: Family Medicine

## 2012-09-14 VITALS — BP 120/80 | HR 92 | Resp 16 | Ht 67.0 in | Wt 181.4 lb

## 2012-09-14 DIAGNOSIS — G4700 Insomnia, unspecified: Secondary | ICD-10-CM

## 2012-09-14 DIAGNOSIS — E669 Obesity, unspecified: Secondary | ICD-10-CM

## 2012-09-14 DIAGNOSIS — N318 Other neuromuscular dysfunction of bladder: Secondary | ICD-10-CM

## 2012-09-14 DIAGNOSIS — H04129 Dry eye syndrome of unspecified lacrimal gland: Secondary | ICD-10-CM

## 2012-09-14 DIAGNOSIS — I10 Essential (primary) hypertension: Secondary | ICD-10-CM

## 2012-09-14 DIAGNOSIS — N3281 Overactive bladder: Secondary | ICD-10-CM

## 2012-09-14 MED ORDER — TEMAZEPAM 15 MG PO CAPS: 15.0000 mg | ORAL_CAPSULE | Freq: Every evening | ORAL | Status: DC | PRN

## 2012-09-14 MED ORDER — OXYBUTYNIN CHLORIDE ER 10 MG PO TB24: 10.0000 mg | ORAL_TABLET | Freq: Every day | ORAL | Status: DC

## 2012-09-14 NOTE — Patient Instructions (Addendum)
Try the eye drop for dry eyes Start new bladder pill- Ditropan, stop the patch when you start  Try the new sleeping pill Continue your blood pressure medications F/U 3 months

## 2012-09-16 ENCOUNTER — Encounter: Payer: Self-pay | Admitting: Family Medicine

## 2012-09-16 DIAGNOSIS — H04129 Dry eye syndrome of unspecified lacrimal gland: Secondary | ICD-10-CM | POA: Insufficient documentation

## 2012-09-16 DIAGNOSIS — N3281 Overactive bladder: Secondary | ICD-10-CM | POA: Insufficient documentation

## 2012-09-16 DIAGNOSIS — G4700 Insomnia, unspecified: Secondary | ICD-10-CM | POA: Insufficient documentation

## 2012-09-16 NOTE — Assessment & Plan Note (Signed)
Well controlled 

## 2012-09-16 NOTE — Assessment & Plan Note (Signed)
Trial of ditropan

## 2012-09-16 NOTE — Assessment & Plan Note (Signed)
Trial of restoril, failed [place]

## 2012-09-16 NOTE — Assessment & Plan Note (Signed)
Based on time course and otherwise benign symptoms may be dry eye causing redness, will give script for Rephresh, she is to call eye doctor if this does not improve

## 2012-09-16 NOTE — Progress Notes (Signed)
  Subjective:    Patient ID: Molly Patel, female    DOB: 28-Sep-1945, 67 y.o.   MRN: [id number]  HPI  Pt her eto f/u chronic medical problems, has been doing well s/p craniotomy for left meningoma causing shift. Denies H/V, Dizziness. She has has red eyes mostly in the morning with excessive blinking for hte past 4-5 months, she did see her eye doctor but it has not improved, no pus from eyes, no change in vision Using OTC medication for OAB and urinary incontinence has tried [place] but could not afford, using OTC oxytrol patch, not helping very much Ambien did not help with sleep would like to try something different Review of Systems   GEN- denies fatigue, fever, weight loss,weakness, recent illness HEENT- denies eye drainage, change in vision, nasal discharge, CVS- denies chest pain, palpitations RESP- denies SOB, cough, wheeze ABD- denies N/V, change in stools, abd pain GU- denies dysuria, hematuria, dribbling, incontinence MSK- denies joint pain, muscle aches, injury Neuro- denies headache, dizziness, syncope, seizure activity      Objective:   Physical Exam  GEN- NAD, alert and oriented x3 HEENT- PERRL, EOMI,  injected sclera, pink conjunctiva, MMM, oropharynx clear, minimal tearing, vision grossly in tact, Head- incision d/c/i Neck- Supple,  CVS- RRR, no murmur RESP-CTAB EXT- No edema Pulses- Radial, DP- 2+       Assessment & Plan:

## 2012-09-16 NOTE — Assessment & Plan Note (Signed)
Weight loss of 7lbs

## 2012-09-20 ENCOUNTER — Encounter

## 2012-09-20 NOTE — Progress Notes (Signed)
The patient is a 67 year old female referred by Dr. Rozann Lesches for bladder cancer.    The patient's PMH is significant for arthritis, hypothyroidism, depression and emphysema.    The patient was recently discovered to have superficial high-grade bladder cancer back On 11/27/2011 and again on 03/31/2012. She did receive adjuvant BCG. Recent cystoscopy did show recurrent right-sided bladder wall tumor.      09/03/12 CT abdomen and pelvis without contrast IMPRESSION: 1. Persistent wall thickening of the urinary bladder with a 14 mm enhancing mass at the right bladder base in the area of the right UVJ. This appears to mildly obstruct the right kidney given that there is mild hydronephrosis and mild delayed enhancement of the right kidney. No evidence for recurrent or evolving metastatic disease is otherwise seen.    On 09/07/12 the patient had a transurethral resection of bladder tumor (medium).  The diagnosis ???BLADDER TUMOR???: HIGH GRADE UROTHELIAL CARCINOMA. MUSCULARIS PROPRIA TISSUE  IS NOT IDENTIFIED WITHIN SUBMITTED TISSUE.      FISH Analysis showed:

## 2012-09-27 NOTE — Progress Notes (Signed)
[facility]: Office Visit New Patient H & P    Chief Complaint:    Chief Complaint   Patient presents with   ??? Bladder Cancer     New Patient here for bladder cancer         History of Present Illness:  [name] is a 67 y.o. female who presents today for evaluation regarding bladder cancer.  She has a past  PMH is significant for arthritis, hypothyroidism, depression and emphysema. The patient was recently discovered to have superficial high-grade bladder cancer back on 11/27/2011 and again on 03/31/2012. She did receive adjuvant BCG.  Recent cystoscopy showed recurrent right-sided bladder wall tumor. She underwent TURBT with the specimen showing high grade urothelial carcinoma with no muscularis in the specimen.  She is incontinent and cannot hold the BCG in her bladder long enough to have therapeutic effect.  She has been recommended to undergo cystectomy but Dr. Rozann Lesches has recommended oncology evaluation prior to surgery to discuss potential neoadjuvant chemotherapy.    Review of Systems:  Constitutional: Positive for malaise/fatigue. Negative for fever, chills, weight loss and diaphoresis.   HENT: Negative. Negative for hearing loss, ear pain, nosebleeds, congestion, sore throat, neck pain, tinnitus and ear discharge.   Eyes: Negative. Negative for blurred vision, double vision, photophobia, pain, discharge and redness.   Respiratory: Positive for shortness of breath. Negative for cough, hemoptysis, sputum production, wheezing and stridor.   Cardiovascular: Negative. Negative for chest pain, palpitations, orthopnea, claudication, leg swelling and PND.   Gastrointestinal: Negative. Negative for heartburn, nausea, vomiting, abdominal pain, diarrhea, constipation, blood in stool and melena.   Genitourinary: Positive for dysuria, frequency and hematuria. Negative for urgency and flank pain.   Musculoskeletal: Negative. Negative for myalgias, back pain, joint pain and falls.   Skin: Positive for  itching. Negative for rash.   Neurological: Negative. Negative for dizziness, tingling, tremors, sensory change, speech change, focal weakness, seizures, loss of consciousness, weakness and headaches.   Endo/Heme/Allergies: Negative for environmental allergies and polydipsia. Does not bruise/bleed easily.   Psychiatric/Behavioral: Positive for depression. Negative for suicidal ideas, hallucinations, memory loss and substance abuse. The patient is not nervous/anxious and does not have insomnia.     No Known Allergies  Past Medical History   Diagnosis Date   ??? Arthritis      generalized   ??? Environmental allergies      managed with medication    ??? Hypothyroidism      managed with medication    ??? Depression      managed with medication    ??? Bladder cancer    ??? Emphysema    ??? Former smoker      Past Surgical History   Procedure Laterality Date   ??? Hx breast biopsy Left x 2 2000         ??? Hx cesarean section       x 1   ??? Hx tubal ligation     ??? Hx urological       cystoscopy/ turbt x 2     Family History   Problem Relation Age of Onset   ??? Stroke Mother    ??? Cancer Father    ??? Asthma Father    ??? Lung Disease Father      COPD/smoker   ??? Lung Disease Sister    ??? Cancer Brother    ??? Heart Disease Brother    ??? Alcohol abuse Brother      History  Social History   ??? Marital Status: MARRIED     Spouse Name: N/A     Number of Children: N/A   ??? Years of Education: N/A     Occupational History   ??? Not on file.     Social History Main Topics   ??? Smoking status: Former Smoker -- 0.50 packs/day for 20 years   ??? Smokeless tobacco: Never Used      Comment: stopped at age 41   ??? Alcohol Use: No   ??? Drug Use: No   ??? Sexually Active:      Other Topics Concern   ??? Not on file     Social History Narrative   ??? No narrative on file     Current Outpatient Prescriptions   Medication Sig Dispense Refill   ??? traMADol (ULTRAM) 50 mg tablet Take 50 mg by mouth four (4) times daily.       ??? polyethylene glycol (MIRALAX) 17 gram/dose powder Take  17 g by mouth daily.       ??? nystatin (MYCOSTATIN) topical cream Apply  to affected area two (2) times a day.       ??? levothyroxine (SYNTHROID) 25 mcg tablet Take 25 mcg by mouth Daily (before breakfast). Indications: HYPOTHYROIDISM       ??? lubiPROStone (AMITIZA) 24 mcg capsule Take 24 mcg by mouth two (2) times daily (with meals). Indications: CHRONIC IDIOPATHIC CONSTIPATION       ??? montelukast (SINGULAIR) 10 mg tablet Take 10 mg by mouth nightly.       ??? baclofen (LIORESAL) 10 mg tablet Take 10 mg by mouth three (3) times daily. Indications: unsure why       ??? venlafaxine-SR (EFFEXOR XR) 150 mg capsule Take 150 mg by mouth every morning. Indications: GENERALIZED ANXIETY DISORDER       ??? darifenacin (ENABLEX) 7.5 mg ER tablet Take 7.5 mg by mouth nightly. Indications: INCREASED URINARY FREQUENCY       ??? gabapentin (NEURONTIN) 300 mg tablet Take 300 mg by mouth two (2) times a day.       ??? docusate sodium (COLACE) 100 mg capsule Take 100 mg by mouth two (2) times a day.       ??? HYDROcodone-acetaminophen (NORCO) 5-325 mg per tablet Take 1 Tab by mouth every four (4) hours as needed for Pain.  30 Tab  0   ??? omega-3 fatty acids-vitamin e (FISH OIL) 1,000 mg cap Take 1 Cap by mouth two (2) times a day. Indications: hold until after surgery       ??? phenazopyridine (PYRIDIUM) 200 mg tablet Take 200 mg by mouth three (3) times daily as needed.       ??? estradiol (ESTRACE) 0.01 % (0.1 mg/g) vaginal cream Insert 2 g into vagina as directed.           OBJECTIVE:  BP 140/88   Pulse 115   Temp(Src) 98.5 ??F (36.9 ??C) (Oral)   Resp 16   Ht 5\' 2"  (1.575 m)   Wt 153 lb 8 oz (69.627 kg)   BMI 28.07 kg/m2   SpO2 95%    Physical Exam:  Constitutional: Well developed, well nourished female in no acute distress, sitting comfortably on the examination table.    HEENT: Normocephalic and atraumatic. Oropharynx is clear, mucous membranes are moist.  Sclerae anicteric. Neck supple without JVD. No thyromegaly present.    Lymph node   No  palpable submandibular, cervical, supraclavicular, axillary or inguinal lymph  nodes.   Skin Warm and dry.  No bruising and no rash noted.  No erythema.  No pallor.    Respiratory Lungs are decreased in bases bilaterally without wheezes, rales or rhonchi, normal air exchange without accessory muscle use.    CVS Normal rate, regular rhythm and normal S1 and S2.  No murmurs, gallops, or rubs.   Abdomen Soft, nontender and nondistended, normoactive bowel sounds.  No palpable mass.  No hepatosplenomegaly.   Neuro Grossly nonfocal with no obvious sensory or motor deficits.   MSK Normal range of motion in general.  No edema and no tenderness.   Psych Appropriate mood and affect.      Labs:  Component      Latest Ref Rng 09/03/2012 09/03/2012           1:32 PM  1:32 PM   Sodium      136 - 145 MMOL/L  139   Potassium      3.5 - 5.1 MMOL/L  4.5   Chloride      98 - 107 MMOL/L  105   CO2      21 - 32 MMOL/L  31   Anion gap      7 - 16 mmol/L  3 (L)   Glucose      65 - 100 MG/DL  97   BUN      8 - 23 MG/DL  15   Creatinine      0.6 - 1.0 MG/DL  1.61 (L)   GFR est AA      >60 ml/min/1.66m2  >60   GFR est non-AA      >60 ml/min/1.56m2  >60   Calcium      8.3 - 10.4 MG/DL  9.0   Bilirubin, total      0.2 - 1.1 MG/DL  0.3   ALT      12 - 65 U/L  17   AST      15 - 37 U/L  23   Alk. phosphatase      50 - 136 U/L  97   Protein, total      6.3 - 8.2 g/dL  7.5   Albumin      3.2 - 4.6 g/dL  3.6   Globulin      2.3 - 3.5 g/dL  3.9 (H)   A-G Ratio      1.2 - 3.5    0.9 (L)   WBC      4.3 - 11.1 K/uL 8.3    RBC      4.05 - 5.25 M/uL 5.00    HGB      11.7 - 15.4 g/dL 09.6    HCT      04.5 - 46.3 % 44.6    MCV      79.6 - 97.8 FL 89.2    MCH      26.1 - 32.9 PG 28.4    MCHC      31.4 - 35.0 g/dL 40.9    RDW      81.1 - 14.6 % 14.4    PLATELET      150 - 450 K/uL 251    MPV      10.8 - 14.1 FL 10.8        Imaging:  Results for orders placed during the hospital encounter of 09/03/12   CT CHEST ABD PELV W CONT    Narrative CT CHEST, ABDOMEN AND  PELVIS WITH INTRAVENOUS CONTRAST  DATED 09/03/2012.    History: Bladder cancer.    Comparison: CT abdomen and pelvis without contrast 12/15/2011    Technique:   Multiple contiguous helical CT images reconstructed at 5 mm were  obtained from the base of the neck to the ischial tuberosities following oral  and 100 cc intravenous contrast without acute complication.    Findings:  CT Chest:  The base of the neck is unremarkable in appearance.  No axillary, mediastinal,  or hilar lymphadenopathy is seen.  The thoracic aorta is normal in caliber.    Evaluation with lung windows demonstrates no worrisome pulmonary nodules, or  masses. There is a focal density in the medial right middle lobe seen on image  2 although this is elongated seen on 3 consecutive images and therefore  measures at least 1.5 cm in length and therefore is felt to be linear scar.  No  pleural effusion is seen.  Lungs are expanded without evidence for  pneumothorax.    CT ABDOMEN:  The Liver is homogeneous in attenuation.  The spleen is homogeneous in  attenuation.  No contour deforming or enhancing mass lesions are seen of the  pancreas or adrenal glands.  The gallbladder has an unremarkable CT appearance  without radioopaque stones or pericholecystic fluid/inflammatory changes. The  right renal pelvis is prominent although the majority of this change is felt to  be due to an extrarenal pelvis. A mild component of hydronephrosis is suspected  given that there is mildly delayed enhancement of the right kidney as compared  to the left best appreciated on axial image 64.  In addition, minimal fullness  of the right ureter is seen.    The visualized loops of small bowel and colon are normal in caliber.  The  appendix is unremarkable in appearance.  No free fluid and no free air is seen  in the abdomen.  No involving adenopathy is seen of the abdomen.  The abdominal  aorta demonstrates mild atherosclerotic changes.    CT PELVIS:  No abnormal pelvic fluid  collections are present.  No evolving pelvic  adenopathy is seen. Persistent wall thickening of the urinary bladder is seen.  There is improved distention on today's study. Mild infiltration of the  adjacent fat is seen. In addition, there is what is felt to be a new enhancing  mass at the level of the right bladder base best appreciated on image 109  measuring 14 mm in size.  An additional 1.8 cm diverticulum is seen in the area  of the left bladder base.      Impression IMPRESSION:  1.  Persistent wall thickening of the urinary bladder with a 14 mm enhancing  mass at the right bladder base in the area of the right UVJ. This appears to  mildly obstruct the right kidney given that there is mild hydronephrosis and  mild delayed enhancement of the right kidney. No evidence for recurrent or  evolving metastatic disease is otherwise seen.    [facility]         Pathology:    ASSESSMENT:  1. Bladder cancer      Problem List Date Reviewed: 10/19/12        Codes Class Noted    Bladder cancer 188.9  09/20/2012                PLAN:  Lab studies were personally reviewed.    Pertinent old records were reviewed from PGU and pathology.     Case discussed  with Dr. Rozann Lesches.    Urothelial carcinoma: recurrent high grade, at least T1 but no muscularis in the TURBT specimen.    I agree that due to recurrent TURBT and BCG failure, cystectomy is indicated.  However, I cannot discern if this is truly invasive disease beyond lamina propria invasion.  I discussed the situation with Dr. Rozann Lesches, who will in turn discuss with the patient.  If we have additional tissue that shows tumor into muscularis, I would recommend neoadjuvant chemotherapy.  If, however, no additional biopsy is done or a repeat biopsy does not show muscularis involvement, I would recommend proceeding straight to cystectomy.  If the cystectomy specimen then shows muscle invasive disease, we can discuss the risks and benefits of adjuvant therapy at that time.      All questions  were asked and answered to the best of my ability, and they agree to proceed.  F/u in about 3-4 weeks to either start neoadjuvant therapy or counsel further regarding definitive surgery.              Sherlene Shams, MD  [facility]  [address]   [address]  Office : [phone]  Fax : [phone]

## 2012-09-27 NOTE — Patient Instructions (Addendum)
NOTES FROM YOUR NURSE:    REASON FOR VISIT:  New patient visit for bladder cancer    PLAN:  -Dr. Mikal Plane to discuss additional biopsy with Dr. Rozann Lesches to examine the muscle tissue of the bladder  -Chemotherapy (cisplatin and gemcitabine weekly for 3-4 months) depends on biopsy findings (if there is no invasion into the muscle, chemotherapy is not indicated)  -Follow up with Dr. Mikal Plane based on recommendations from Dr. Rozann Lesches    -------------------------------------------------------------------------------------------------------------------  Please call our office at [phone] if you have any additional questions or develop any of the following symptoms: fever, chills, dizziness, signs of bleeding, or pain/swelling in the legs.    Tor Netters, RN    Share your experience with other patients on [email or web address]!  Type the following link into your web address bar: [email or web address]    You can also search for Dr. Charlton Amor at [email or web address].    ------------------------------------------------------------------------------------------------    [facility] has joined [employer]!   Log on to [email or web address] today to get connected with other patients and [facility] staff!      Chemotherapy: After Your Visit  Your Care Instructions  Cancer is often treated with medicines that destroy the cancer cells (chemotherapy). These medicines may slow cancer growth and prevent or stop the spread of cancer. Chemotherapy also can affect healthy cells and cause side effects.  Most people can work and do their normal activities after and even during chemotherapy, but they may need to limit their schedules. Side effects of chemotherapy include nausea and vomiting, pain, and being tired. Some medicines can cause diarrhea or mouth sores. Your doctor may prescribe medicines to treat the side effects. Your doctor will advise you to take extra care to prevent illnesses  and infections, because chemotherapy weakens your natural defenses.  Follow-up care is a key part of your treatment and safety. Be sure to make and go to all appointments, and call your doctor if you are having problems. It???s also a good idea to know your test results and keep a list of the medicines you take.  How can you care for yourself at home?  Medicines  ?? Take your medicines exactly as prescribed. Call your doctor if you think you are having a problem with your medicine. You may get medicine for nausea and vomiting if you have these side effects.  Nausea and vomiting  ?? Drink fluids with your meals and an hour before or after meals.  ?? After vomiting has stopped for 1 hour, sip a rehydration drink, such as Powerade or Gatorade.  ?? Drink plenty of fluids to prevent dehydration. Choose water and other caffeine-free clear liquids until you feel better. Try clear fluids, such as apple or grape juice mixed to half strength with water, rehydration drinks, weak tea with sugar, clear broth, and gelatin dessert. Do not drink citrus juices. If you have kidney, heart, or liver disease and have to limit fluids, talk with your doctor before you increase the amount of fluids you drink.  ?? When you are feeling better, begin eating clear soups and mild foods until all symptoms are gone for 12 to 48 hours. Other good choices include dry toast, crackers, cooked cereal, and gelatin dessert, such as Jell-O.  ?? Check with your doctor to see if you can take an over-the-counter medicine, such as meclizine (Antivert) or dimenhydrinate (Dramamine), to help with nausea.  Pain control  ?? If your doctor prescribes medicines to control pain, take them as directed. Often your doctor will have  you take these medicines regularly to keep your pain under control. Medicine for pain may cause side effects. Let your doctor know if you feel constipated, have trouble urinating, or have nausea.  ?? Try using relaxation exercises to lower your  anxiety and stress, which can increase pain.  ?? Keep track of your pain so you can tell your doctor what your pain is like. Write down where you feel pain, how long it lasts, what seems to bring it on, and how it feels. Also note what makes the pain feel better or worse.  ?? If you have mouth pain, your doctor may prescribe a special mouth rinse that can help relieve the pain.  Weakness and feeling tired  ?? Get extra rest. Plan ahead so you can take breaks or naps.  ?? Save your energy for the most important things you want to do.  ?? Try to get some exercise, such as walking, but stop if you are too tired.  ?? Eat a balanced diet. Do not skip meals, especially breakfast.  ?? Try to lower your stress and workload. Relaxation exercises, music therapy, and prayer are ways to lower stress and help you relax.  ?? Ask family and friends to help with home chores and other tasks.  To prevent infections  ?? Wash your hands often during the day, especially before you eat and after you use the bathroom.  ?? Stay away from people who have illnesses that you might catch, such as the flu or a cold.  ?? Try to stay out of crowds.  ?? Clean cuts and scrapes right away with warm water and soap. Clean them daily until they are healed.  ?? Keep track of your temperature, if your doctor recommends it. You can do this by taking your temperature at regular times and writing it down.  Hair loss  ?? Use a mild shampoo and a soft hair brush.  ?? Use a low setting on your hair dryer. Do not color or perm your hair.  ?? Have your hair cut short. It will look thicker and fuller, and it will not be such a shock if you lose hair.  ?? Use sunscreen and a hat, scarf, or turban to protect your scalp from the sun.  When should you call for help?  Call 911 anytime you think you may need emergency care. For example, call if:  ?? You pass maroon or very bloody stools.  ?? You have a severe headache or changes in your vision.  ?? You passed out (lost consciousness).   Call your doctor now or seek immediate medical care if:  ?? You are short of breath.  ?? You have new or severe pain.  ?? You have chills, a fever, or a cough.  ?? Your symptoms, such as nausea or feeling tired, get worse.  ?? Your stools are black and tarlike or have streaks of blood.  ?? You have severe diarrhea that is not getting better.  ?? You have unusual bruising or bleeding.  ?? Your pain is not controlled with your medicine.  ?? You are dizzy or lightheaded, or you feel like you may faint.  Watch closely for changes in your health, and be sure to contact your doctor if:  ?? You have sores in your mouth.   Where can you learn more?   Go to MetropolitanBlog.hu  Enter B484 in the search box to learn more about "Chemotherapy: After Your Visit."   ?? 2006-2013 Healthwise,  Incorporated. Care instructions adapted under license by [employer] (which disclaims liability or warranty for this information). This care instruction is for use with your licensed healthcare professional. If you have questions about a medical condition or this instruction, always ask your healthcare professional. Healthwise, Incorporated disclaims any warranty or liability for your use of this information.  Content Version: 9.9.209917; Last Revised: May 15, 2010

## 2012-09-27 NOTE — Progress Notes (Signed)
HISTORY OF PRESENT ILLNESS  Cathy Jackson is a 67 y.o. female.  HPI    Review of Systems   Constitutional: Positive for malaise/fatigue. Negative for fever, chills, weight loss and diaphoresis.   HENT: Negative.  Negative for hearing loss, ear pain, nosebleeds, congestion, sore throat, neck pain, tinnitus and ear discharge.    Eyes: Negative.  Negative for blurred vision, double vision, photophobia, pain, discharge and redness.   Respiratory: Positive for shortness of breath. Negative for cough, hemoptysis, sputum production, wheezing and stridor.    Cardiovascular: Negative.  Negative for chest pain, palpitations, orthopnea, claudication, leg swelling and PND.   Gastrointestinal: Negative.  Negative for heartburn, nausea, vomiting, abdominal pain, diarrhea, constipation, blood in stool and melena.   Genitourinary: Positive for dysuria, frequency and hematuria. Negative for urgency and flank pain.   Musculoskeletal: Negative.  Negative for myalgias, back pain, joint pain and falls.   Skin: Positive for itching. Negative for rash.   Neurological: Negative.  Negative for dizziness, tingling, tremors, sensory change, speech change, focal weakness, seizures, loss of consciousness, weakness and headaches.   Endo/Heme/Allergies: Negative for environmental allergies and polydipsia. Does not bruise/bleed easily.   Psychiatric/Behavioral: Positive for depression. Negative for suicidal ideas, hallucinations, memory loss and substance abuse. The patient is not nervous/anxious and does not have insomnia.        Physical Exam    ASSESSMENT and PLAN

## 2012-10-11 LAB — URINALYSIS W/ RFLX MICROSCOPIC
Bacteria: 0 /HPF
Bilirubin: NEGATIVE
Glucose: NEGATIVE MG/DL
Ketone: NEGATIVE MG/DL
Nitrites: NEGATIVE
Protein: 100 MG/DL — AB
Specific gravity: 1.025 — ABNORMAL HIGH (ref 1.001–1.023)
Urobilinogen: 0.2 EU/DL (ref 0.2–1.0)
pH (UA): 5 (ref 5.0–9.0)

## 2012-10-11 LAB — METABOLIC PANEL, COMPREHENSIVE
A-G Ratio: 0.9 — ABNORMAL LOW (ref 1.2–3.5)
ALT (SGPT): 20 U/L (ref 12–65)
AST (SGOT): 18 U/L (ref 15–37)
Albumin: 3.8 g/dL (ref 3.2–4.6)
Alk. phosphatase: 122 U/L (ref 50–136)
Anion gap: 5 mmol/L — ABNORMAL LOW (ref 7–16)
BUN: 20 MG/DL (ref 8–23)
Bilirubin, total: 0.3 MG/DL (ref 0.2–1.1)
CO2: 33 MMOL/L — ABNORMAL HIGH (ref 21–32)
Calcium: 9.3 MG/DL (ref 8.3–10.4)
Chloride: 103 MMOL/L (ref 98–107)
Creatinine: 0.59 MG/DL — ABNORMAL LOW (ref 0.6–1.0)
GFR est AA: >60 mL/min/{1.73_m2} (ref >60)
GFR est non-AA: >60 mL/min/{1.73_m2} (ref >60)
Globulin: 4.3 g/dL — ABNORMAL HIGH (ref 2.3–3.5)
Glucose: 80 MG/DL (ref 65–100)
Potassium: 4.4 MMOL/L (ref 3.5–5.1)
Protein, total: 8.1 g/dL (ref 6.3–8.2)
Sodium: 141 MMOL/L (ref 136–145)

## 2012-10-11 LAB — CBC W/O DIFF
HCT: 44.9 % (ref 35.8–46.3)
HGB: 14.5 g/dL (ref 11.7–15.4)
MCH: 29.3 PG (ref 26.1–32.9)
MCHC: 32.3 g/dL (ref 31.4–35.0)
MCV: 90.7 FL (ref 79.6–97.8)
MPV: 11.5 FL (ref 10.8–14.1)
PLATELET: 261 10*3/uL (ref 150–450)
RBC: 4.95 M/uL (ref 4.05–5.25)
RDW: 14.3 % (ref 11.9–14.6)
WBC: 9.1 10*3/uL (ref 4.3–11.1)

## 2012-10-11 LAB — PTT: aPTT: 25.7 s (ref 23.5–31.7)

## 2012-10-11 LAB — PROTHROMBIN TIME + INR
INR: 1 (ref 0.9–1.2)
Prothrombin time: 10.6 s (ref 9.6–11.6)

## 2012-10-11 NOTE — Other (Signed)
Patient came in for labs

## 2012-10-11 NOTE — Progress Notes (Signed)
[facility]  [address]  [place], [place] [address]  [phone]    Cathy Jackson  DOB: Aug 28, 1945     HPI   67 y.o., female returns in follow up for bladder cancer.  S/P TURBT on 09/08/11. ??Final path showed T1G3 TCC of the bladder. ????No muscularis propria seen in specimen. ??She has met with Dr. Mikal Plane who does not feel that neoadjuvant chemo is indicated due to her lack of T2 disease. ??She denies any recent infections on Keflex suppression. ??She remains on Enablex 7.5mg  qd with cont irr LUTS and pelvic pain for which she takes Ultram prn. ??Previously diagnosed with TaG3 TCC of the bladder on 11/27/11. ??Repeat biopsies on 03/31/12 again showed TaG3 TCC. ??Completed 6 wk course of adjuvant BCG although it was difficult for her to hold the BCG in her bladder for a long period of time. ??CT on 09/03/12 shows a R sided bladder tumor without obvious mets. ??Previous C/S.  Returns for preassessment.      Past Medical History   Diagnosis Date   ??? Arthritis      generalized   ??? Environmental allergies      managed with medication    ??? Hypothyroidism      managed with medication    ??? Depression      managed with medication    ??? Bladder cancer    ??? Emphysema    ??? Former smoker      Past Surgical History   Procedure Laterality Date   ??? Hx breast biopsy Left x 2 2000         ??? Hx cesarean section       x 1   ??? Hx tubal ligation     ??? Hx urological       cystoscopy/ turbt x 2     Current Outpatient Prescriptions   Medication Sig Dispense Refill   ??? traMADol (ULTRAM) 50 mg tablet Take 50 mg by mouth four (4) times daily.       ??? polyethylene glycol (MIRALAX) 17 gram/dose powder Take 17 g by mouth daily.       ??? nystatin (MYCOSTATIN) topical cream Apply  to affected area two (2) times a day.       ??? HYDROcodone-acetaminophen (NORCO) 5-325 mg per tablet Take 1 Tab by mouth every four (4) hours as needed for Pain.  30 Tab  0   ??? levothyroxine (SYNTHROID) 25 mcg tablet Take 25 mcg by mouth Daily (before breakfast). Indications:  HYPOTHYROIDISM       ??? lubiPROStone (AMITIZA) 24 mcg capsule Take 24 mcg by mouth two (2) times daily (with meals). Indications: CHRONIC IDIOPATHIC CONSTIPATION       ??? montelukast (SINGULAIR) 10 mg tablet Take 10 mg by mouth nightly.       ??? baclofen (LIORESAL) 10 mg tablet Take 10 mg by mouth three (3) times daily. Indications: unsure why       ??? venlafaxine-SR (EFFEXOR XR) 150 mg capsule Take 150 mg by mouth every morning. Indications: GENERALIZED ANXIETY DISORDER       ??? darifenacin (ENABLEX) 7.5 mg ER tablet Take 7.5 mg by mouth nightly. Indications: INCREASED URINARY FREQUENCY       ??? omega-3 fatty acids-vitamin e (FISH OIL) 1,000 mg cap Take 1 Cap by mouth two (2) times a day. Indications: hold until after surgery       ??? gabapentin (NEURONTIN) 300 mg tablet Take 300 mg by mouth two (2) times  a day.       ??? phenazopyridine (PYRIDIUM) 200 mg tablet Take 200 mg by mouth three (3) times daily as needed.       ??? docusate sodium (COLACE) 100 mg capsule Take 100 mg by mouth two (2) times a day.       ??? estradiol (ESTRACE) 0.01 % (0.1 mg/g) vaginal cream Insert 2 g into vagina as directed.         No Known Allergies  History     Social History   ??? Marital Status: MARRIED     Spouse Name: N/A     Number of Children: N/A   ??? Years of Education: N/A     Occupational History   ??? Not on file.     Social History Main Topics   ??? Smoking status: Former Smoker -- 0.50 packs/day for 20 years   ??? Smokeless tobacco: Never Used      Comment: stopped at age 56   ??? Alcohol Use: No   ??? Drug Use: No   ??? Sexually Active:      Other Topics Concern   ??? Not on file     Social History Narrative   ??? No narrative on file     Family History   Problem Relation Age of Onset   ??? Stroke Mother    ??? Cancer Father    ??? Asthma Father    ??? Lung Disease Father      COPD/smoker   ??? Lung Disease Sister    ??? Cancer Brother    ??? Heart Disease Brother    ??? Alcohol abuse Brother        Review of Systems  All systems reviewed and are negative at this time.     Physical Exam  BP 125/87   Pulse 116   Temp(Src) 99.3 ??F (37.4 ??C) (Tympanic)   Wt 154 lb (69.854 kg)   BMI 28.16 kg/m2  General appearance - alert, well appearing, and in no distress  Mental status - alert and oriented  Eyes - extraocular eye movements intact, sclera anicteric  Nose - normal and patent, no erythema or discharge  Mouth - mucous membranes moist  Chest - clear to auscultation bilaterally  Heart - normal rate, regular rhythm  Abdomen - soft, nontender, nondistended, no masses or organomegaly.  Lower midline incision without hernia.  Neurological - normal speech, no focal findings or movement disorder noted  Skin - normal coloration and turgor          Assessment/Plan  Bladder cancer.  Will proceed with anterior pelvic exonteration, bilateral PLND and ileal conduit urinary diversion.  All risks, benefits and alternatives to the above mentioned procedure were discussed and the patient is willing to proceed at this time.  All questions answered today.

## 2012-10-12 NOTE — Wound Image (Signed)
Patient seen for pre op education and stoma marking for ileal conduit. Patient marked in RLQ and given written material on managing your urostomy by [employer]. Daughter Trudie Buckler present and was a Lawyer and familiar with some ostomy cares. She will be in the home with patient for now. Answered patient questions. Shown urostomy pouching system. Surgery scheduled for tomorrow. Ostomy nurses will follow.

## 2012-10-12 NOTE — Telephone Encounter (Signed)
Per [place], auth# [id number] for surgery [id number], [phone] DOS: 10/13/2012.  Call ref# [id number]

## 2012-10-12 NOTE — Other (Signed)
Patient's guide to surgery given along with Pt education sheets regarding transfusions, pain management,smoking,and hand hygiene for the family and community. Pt verbalizes understanding of all pre-op instructions . Reinforced nothing to eat or drink after midnight on the day prior to surgery , this includes gum, mints, ice chips or water. Instructed that family must be present in building at all times.    Instructed Patient that they will be notified the day prior to surgery ( or the Friday before if surgery is on a Monday) of their arrival time by pre-op nurse with  Verbal understanding.    Verbal and written Instructions given to patient to shower with antibacterial soap for 3 nights prior to surgery. Bar Soap provided at time of pre-assessment.    Shower from chin to toes ( except the genital area) and especially the surgical site area with hibiclens on the night before and the am of surgery. Allow hibiclens to sit on skin for at least 20 seconds and rinse well. 2 packets of hibiclens given for use. Wear freshly laundered clothing in to hospital, have clean bed linen on bed and do not allow pets to sleep or lay with them. Patient verbalized understanding of all of the above.      No audible murmur heard at pre-assessment.     Patient safety information sheet given per registration with numbers for patient relations: [phone] and patient safety [phone]. Instructed pt that for any family member to obtain information or updates on pt's condition that they must have 4 digit code provided to them at registration. Pt verbalized understanding.     Instructed patient to continue  previous medications as prescribed prior to surgery and  to take Effexor, Levothyroxine, Gabapentin and if needed Ultram or Hydrocodone  On the Day of surgery with sip of water.  Written Medication report given and copy to chart. Do not hold any medications that you have not been instructed to do either at your pre-assessment or  per your surgeon.    Continue all previous medications unless otherwise directed.      Instructed patient to stop the following medications prior to surgery: none    Dr. Nadara Mustard made aware of health history and length of surgery. Dr.Whigham states that patient can be seen by Anesthesia day of surgery.     TYPE 3    CASE   Labs per surgeon :CBC, BMP,PT,PTT,U/A, U/C done on 10/11/12  Labs per grid :no added   EKG  :  none

## 2012-10-13 ENCOUNTER — Inpatient Hospital Stay
Admit: 2012-10-13 | Discharge: 2012-10-23 | Disposition: A | Discharge status: Home / Self Care | Payer: MEDICARE | Source: Ambulatory Visit | Attending: Urology | Admitting: Urology

## 2012-10-13 DIAGNOSIS — C679 Malignant neoplasm of bladder, unspecified: Secondary | ICD-10-CM

## 2012-10-13 LAB — HGB & HCT
HCT: 34.3 % — ABNORMAL LOW (ref 35.8–46.3)
HGB: 11 g/dL — ABNORMAL LOW (ref 11.7–15.4)

## 2012-10-13 LAB — METABOLIC PANEL, BASIC
Anion gap: 9 mmol/L (ref 7–16)
BUN: 7 MG/DL — ABNORMAL LOW (ref 8–23)
CO2: 22 MMOL/L (ref 21–32)
Calcium: 7.1 MG/DL — ABNORMAL LOW (ref 8.3–10.4)
Chloride: 111 MMOL/L — ABNORMAL HIGH (ref 98–107)
Creatinine: 0.65 MG/DL (ref 0.6–1.0)
GFR est AA: >60 mL/min/{1.73_m2} (ref >60)
GFR est non-AA: >60 mL/min/{1.73_m2} (ref >60)
Glucose: 207 MG/DL — ABNORMAL HIGH (ref 65–100)
Potassium: 4.1 MMOL/L (ref 3.5–5.1)
Sodium: 142 MMOL/L (ref 136–145)

## 2012-10-13 LAB — GLUCOSE, POC: Glucose (POC): 134 mg/dL — ABNORMAL HIGH (ref 65–100)

## 2012-10-13 MED ADMIN — 0.9% sodium chloride infusion: INTRAVENOUS | @ 20:00:00 | NDC 00409798423

## 2012-10-13 MED ADMIN — vecuronium (NORCURON) injection: INTRAVENOUS | @ 21:00:00 | NDC 41616093140

## 2012-10-13 MED ADMIN — ondansetron (ZOFRAN) injection: INTRAVENOUS | @ 22:00:00 | NDC 00781301095

## 2012-10-13 MED ADMIN — lidocaine (PF) (XYLOCAINE) 20 mg/mL (2 %) injection: INTRAVENOUS | @ 17:00:00 | NDC 00409428202

## 2012-10-13 MED ADMIN — HYDROmorphone (PF) (DILAUDID) injection: INTRAVENOUS | @ 20:00:00 | NDC 00409335601

## 2012-10-13 MED ADMIN — vecuronium (NORCURON) injection: INTRAVENOUS | @ 17:00:00 | NDC 41616093140

## 2012-10-13 MED ADMIN — HYDROmorphone (PF) (DILAUDID) injection: INTRAVENOUS | @ 21:00:00 | NDC 00409335601

## 2012-10-13 MED ADMIN — propofol (DIPRIVAN) 10 mg/mL injection: INTRAVENOUS | @ 17:00:00 | NDC 63323027025

## 2012-10-13 MED ADMIN — HYDROmorphone (PF) (DILAUDID) injection: INTRAVENOUS | @ 19:00:00 | NDC 00409335601

## 2012-10-13 MED ADMIN — fentaNYL citrate (PF) injection: INTRAVENOUS | @ 18:00:00 | NDC 10019003867

## 2012-10-13 MED ADMIN — midazolam (VERSED) injection 2 mg: INTRAVENOUS | @ 17:00:00 | NDC 63323041112

## 2012-10-13 MED ADMIN — vecuronium (NORCURON) injection: INTRAVENOUS | @ 19:00:00 | NDC 41616093140

## 2012-10-13 MED ADMIN — lactated ringers infusion: INTRAVENOUS | @ 18:00:00 | NDC 00409795309

## 2012-10-13 MED ADMIN — vecuronium (NORCURON) injection: INTRAVENOUS | @ 20:00:00 | NDC 41616093140

## 2012-10-13 MED ADMIN — rocuronium (ZEMURON) injection: INTRAVENOUS | @ 17:00:00 | NDC 67457022810

## 2012-10-13 MED ADMIN — HYDROmorphone (PF) (DILAUDID) injection: INTRAVENOUS | @ 18:00:00 | NDC 00409335601

## 2012-10-13 MED ADMIN — acetaminophen (OFIRMEV) infusion: INTRAVENOUS | @ 22:00:00 | NDC 43825010201

## 2012-10-13 MED ADMIN — phenylephrine 100 mcg/mL syringe (for anesthesia use only): INTRAVENOUS | @ 17:00:00 | NDC 00641614201

## 2012-10-13 MED ADMIN — vecuronium (NORCURON) injection: INTRAVENOUS | @ 18:00:00 | NDC 41616093140

## 2012-10-13 MED ADMIN — fentaNYL citrate (PF) injection: INTRAVENOUS | @ 17:00:00 | NDC 10019003867

## 2012-10-13 MED ADMIN — neostigmine (PROSTIGMINE) injection: INTRAVENOUS | @ 22:00:00 | NDC 10019027010

## 2012-10-13 MED ADMIN — famotidine (PEPCID) tablet 20 mg: ORAL | @ 16:00:00 | NDC 68084017211

## 2012-10-13 MED ADMIN — 0.9% sodium chloride infusion: INTRAVENOUS | @ 19:00:00 | NDC 00409798423

## 2012-10-13 MED ADMIN — lactated ringers infusion: INTRAVENOUS | @ 16:00:00 | NDC 00409795309

## 2012-10-13 MED ADMIN — lactated ringers infusion: INTRAVENOUS | @ 17:00:00 | NDC 00409795309

## 2012-10-13 MED ADMIN — albumin human 5% (BUMINATE) solution: INTRAVENOUS | @ 19:00:00 | NDC 00944049101

## 2012-10-13 MED ADMIN — glycopyrrolate (ROBINUL) injection: INTRAMUSCULAR | @ 22:00:00 | NDC 00143968101

## 2012-10-13 MED ADMIN — cefOXitin (MEFOXIN) 1 g in 0.9% sodium chloride (MBP/ADV) 50 mL MBP: INTRAVENOUS | @ 17:00:00 | NDC 60505075905

## 2012-10-13 MED FILL — MIDAZOLAM 1 MG/ML IJ SOLN: 1 mg/mL | INTRAMUSCULAR | Qty: 2

## 2012-10-13 MED FILL — FAMOTIDINE 20 MG TAB: 20 mg | ORAL | Qty: 1

## 2012-10-13 MED FILL — SODIUM CHLORIDE 0.9 % IV PIGGY BACK: INTRAVENOUS | Qty: 50

## 2012-10-13 NOTE — Other (Signed)
Family updated at 1:00 PM by Sharla Kidney, RN.  After privacy code verified

## 2012-10-13 NOTE — Other (Signed)
Family called with update.  Family gave code needed for information.

## 2012-10-13 NOTE — Progress Notes (Signed)
Pt's family leaving for home. Pt resting comfortably. Vital signs stable.

## 2012-10-13 NOTE — Progress Notes (Signed)
Pt's family to bedside. Update given. Visiting hours reviewed. All questions answered.

## 2012-10-13 NOTE — Other (Signed)
1757- From OR. VSS. T-piece in place with O2 at 10L. O2 sat 100% on arrival. Remains sleepy, will slightly open eyes when name called. NG tube connected to suction. Midline abd incision D&I. JP drain intact with small amt of bloody drainage noted. Urostomy draining yellow urine, site intact. Vaginal packing intact.Epidural in progress.    1810- Dr. Haskell Riling at bedside. Opens eyes when name called. Remains sedated. Moving arms, but unable to squeeze hands yet.

## 2012-10-13 NOTE — Progress Notes (Signed)
PO medications held for tonight. Pt has no bowel sounds. NG to LIS with no drainage. Pt using PCA button. Pt mostly c/o NG tenderness. No vaginal drainage noted. Left hand iv site infiltrated and was removed. Site still swollen, but decreasing. Hand remains elevated. Left radial art line intact and correlates with cuff pressure. Good wave form. Pt asking for ice. Reminded pt that she is NPO.

## 2012-10-13 NOTE — Anesthesia Pre-Procedure Evaluation (Signed)
Anesthetic History              Review of Systems / Medical History      Pulmonary                 Neuro/Psych              Cardiovascular                Exercise tolerance: >4 METS     GI/Hepatic/Renal                  Endo/Other        Arthritis     Other Findings            Physical Exam    Airway  Mallampati: II  TM Distance: > 6 cm  Neck ROM: normal range of motion   Mouth opening: Normal     Cardiovascular  Regular rate and rhythm,  S1 and S2 normal,  no murmur, click, rub, or gallop             Dental  No notable dental hx       Pulmonary  Breath sounds clear to auscultation               Abdominal         Other Findings            Anesthetic Plan    ASA: 2  Anesthesia type: general    Monitoring Plan: Arterial line  Post-op pain plan if not by surgeon: indwelling epidural catheter      Anesthetic plan and risks discussed with: Patient

## 2012-10-13 NOTE — Progress Notes (Signed)
Pt received from PACU per bed with nurses, O2, monitor, epidural. Pt placed on bedside monitor. Art line intact, zeroed. O2 on 3 liters NC. Pt oriented to room. Epidural PCA intact and infusing. Ileal conduit to drainage bag with clear yellow urine draining. JP drain to left abdomen, charged with bloody drainage in bulb. Small amt of shadowing to dsg around drain. Midline abdominal dsg is D/I. Pt denies pain. SCD's on.

## 2012-10-13 NOTE — Other (Signed)
1910- Resting comfortably. Encouraged use of PCA for pain. Midline incs with small amt of bloody drainage noted. Urostomy draining clear yellow urine. Family updated.    TRANSFER - OUT REPORT:    Verbal report given to [facility] on Cathy Jackson  being transferred to room 3112 for routine post - op       Report consisted of patient???s Situation, Background, Assessment and   Recommendations(SBAR).     Information from the following report(s) SBAR, Kardex, OR Summary, Procedure Summary, Intake/Output, MAR and Recent Results was reviewed with the receiving nurse.    Opportunity for questions and clarification was provided.      VTE prophylaxis orders have been initiated.

## 2012-10-13 NOTE — Progress Notes (Signed)
Pt dozing. Easily arouses to touch. No pain. Vital signs stable. Art line correlates with cuff pressure.

## 2012-10-13 NOTE — Other (Signed)
1823- H&H, BMET drawn from a-line and sent to lab.    1827- Dr. Haskell Riling at bedside. Extubated w/o difficulty. O2 via NC @ 4L placed. No c/o pain at present.

## 2012-10-13 NOTE — Brief Op Note (Signed)
BRIEF OPERATIVE NOTE    Date of Procedure: 10/13/2012   Preoperative Diagnosis: BLADDER CA  Postoperative Diagnosis: BLADDER CA    Procedure(s):  ANTERIOR PELVIC EXONTERATION  BILATERAL PELVIC LYMPH NODE DISSECTION  ILEAL CONDUIT URINARY DIVERSION  Surgeon(s) and Role:     * Pryor Montes, DO - Primary     * Rudean Hitt, MD - Assisting  Anesthesia: General   Estimated Blood Loss: 800cc  Specimens:   ID Type Source Tests Collected by Time Destination   1 : UTERUS WITH BILATERAL TUBES AND OVARIES/ BLADDER WITH URETHRA Fresh Abdomen  Lyndle Herrlich Maymie Brunke, DO 10/13/2012  2:22 PM Pathology   2 : right pelvic lymph nodes Fresh   Lyndle Herrlich Kieli Golladay, DO 10/13/2012  3:21 PM Pathology   3 : left pelvic lymph nodes Fresh   Lyndle Herrlich Lindalee Huizinga, DO 10/13/2012  3:21 PM Pathology      Findings: see op note  Complications: None immediate  Implants:   Implant Name Type Inv. Item Serial No. Manufacturer Lot No. LRB No. Used Action   Kermit Balo SGL J [id number] --  - [id number]   2755   [employer] [id number] Bilateral 1 Implanted

## 2012-10-13 NOTE — Op Note (Signed)
[facility]                            [address]                           [place], [place]. [address]                                [phone]                                OPERATIVE REPORT    NAME:  Cathy Jackson, Cathy Jackson                        MR:  [id number]  LOC:  [id number]            SEX:  F               ACCT:  [id number]  DOB:  03/17/1946            AGE:  66              PT:  I  ADMIT:  10/13/2012          DSCH:                 MSV:        PREPROCEDURE DIAGNOSIS: Bladder cancer.    POSTPROCEDURE DIAGNOSIS: Bladder cancer.    NAME OF PROCEDURE: Anterior pelvic exenteration with urethrectomy,  bilateral pelvic lymph node dissection and ileal conduit urinary  diversion.    SURGEON: Carver Fila, DO    ANESTHESIA: General.    ASSISTANTJolene Schimke, MD    BLOOD LOSS: 800 mL.    CLINICAL HISTORY: This is a 67 year old female with a previous history of  T1 grade 3 bladder cancer. She has previously failed BCG therapy, and has  significant pelvic pain and incontinence. All treatment options were  discussed with the patient, and she has elected to proceed with the  above-mentioned procedure. All risks, benefits, and alternatives to the  procedure were reviewed, and she is willing to proceed at this time.    DESCRIPTION OF OPERATIVE PROCEDURE: Patient consent was obtained. A thoracic epidural was placed preoperatively.  The patient was brought back to the operating room, at which time she was  placed in the supine position. After the uneventful induction of general anesthesia, she was then placed  in a low lithotomy position. Her genital and abdominal areas were prepped  and draped, and a sterile field applied. A lower midline incision was  made. Her previous lower midline incision was excised. I carried this incision down to  the peritoneal cavity without event. The patient did have a mild amount  of adhesions from her previous C-section. These were taken  down sharply.  The urachus was identified and used as a handle during dissection of the  uterus and bladder. The peritoneal attachments were taken down laterally  to the round ligament using the LigaSure device. The  ovarian and uterine vessels were also taken down in a similar fashion bilaterally. The peritoneum was then incised  caudad. Both ureters were identified bilaterally and carefully traced  down to the bladder hiatus. Here, they were divided. The distal end of  the ureter was oversewn using 0 Vicryl and the proximal portion of the  ureter was tagged using a 4-0 silk suture. The ureters were then  dissected proximally towards the renal pelves. Following adequate mobilization of the ureters, I then  carefully identified the apex of the vagina. A Betadine-soaked sponge  stick was placed in the vagina to facilitate identification of this  structure. The vagina was then opened in an inverted U fashion. Dissection proceeded along the lateral edges of the anterior vagina using the LigaSure device. The endopelvic fascia was incised bilaterally using  electrocautery and the dorsal venous complex was taken down again using the  LigaSure device.    I then turned my attention to the vaginal area. An inverted U incision  was made over the vagina and the urethra carefully dissected out using a  combination of sharp dissection and electrocautery. The specimen was then  removed en bloc. This included the bladder, urethra, uterus, and both  ovaries. Hemostasis was obtained in the lower pelvis. The vagina was then developed posteriorly to allow for reconstruction of the vagina.  Reconstruction was performed by passing the apex of the vaginal tissue caudad to the site of urethrectomy essentially creating a smaller vaginal pouch. This was then oversewn using 2-0  Vicryl in a running fashion. A Betadine-soaked sponge was placed into the  vagina following reconstruction. No obvious Betadine was seen within the  peritoneal  cavity. A bilateral pelvic lymph node dissection was then  performed. Boundaries of my dissection included the inguinal ligament  inferiorly, pelvic sidewall laterally, and bifurcation of the common  iliac artery superiorly. Care was taken to avoid injury to the obturator  nerves bilaterally. Following removal of all visible nodal tissue,  attention was then turned to the conduit. A section of small bowel,  approximately 15 cm from the ileocecal valve, was used for the ileal  conduit diversion. The total conduit length was approximately 15 cm. I did identify the main vascular arcade to the conduit, and this was  preserved during dissection of the mesenteric vessels. The bowel was then  transected using an endovascular GIA stapler. A side-to-side anastomosis  was then obtained using the stapler in a similar fashion. The mesenteric  defect was then closed using 4-0 silk in a simple interrupted fashion.  The conduit was placed under the bowel resection. The conduit was then  opened distally and thoroughly irrigated. The left ureter was then  brought through a small defect under the sigmoid mesentery. A Bricker  type anastomosis was then performed using both ureters into the ileal  conduit. This was performed using 4-0 Vicryl in a simple interrupted  fashion. Six-French ureteral stents were passed into the ureters and out the conduit  before completion of both anastomoses. The conduit was created in the right lower quadrant at the previous marked site. The skin was incised, as was the subcutaneous fat. A cruciate incision was made in the  fascia and 3-0 Vicryl sutures placed into the fascial edges. The conduit  was passed through this incision and externalized. The previously placed Vicryl 3-0 sutures  were placed through the seromuscular bowel of the conduit. The conduit was then  fashioned in a rosebud formation using 3-0 Vicryl suture. Excellent  drainage was seen from both stents following fashion of the conduit. A  19  round JP drain was then placed through the left lower quadrant. On one  last look, it appeared that there was complete hemostasis in the lower  pelvis. The pelvis was then thoroughly irrigated. The fascia was then  closed using #1 PDS in a running fashion and skin was closed using  staples. The patient tolerated the procedure well. Estimated blood loss  was 800 mL. The patient did receive 1 unit of blood intraoperatively.                Yeraldy Spike P. Lyrick Worland, DO        A                This is an unverified document unless signed by physician.    TID:  [id number]                                      DT:  10/13/2012 06:53 P  JOB:  [id number]           DOC#:  [id number]           DD:  10/13/2012    cc:   Remi Deter P. Geniene List, DO

## 2012-10-13 NOTE — Anesthesia Post-Procedure Evaluation (Signed)
Post-Anesthesia Evaluation and Assessment    Patient: Cathy Jackson MRN: [id number]  SSN: [id number]    Date of Birth: [birth year]  Age: 67 y.o.  Sex: female       Cardiovascular Function/Vital Signs  Visit Vitals   Item Reading   ??? BP 125/62   ??? Pulse 95   ??? Temp 37.2 ??C (99 ??F)   ??? Resp 16   ??? Ht 5\' 2"  (1.575 m)   ??? Wt 69.967 kg (154 lb 4 oz)   ??? BMI 28.21 kg/m2   ??? SpO2 99%       Patient is status post General anesthesia for Procedure(s):  ANTERIOR PELVIC EXONTERATION  BILATERAL PELVIC LYMPH NODE DISSECTION  ILEAL CONDUIT URINARY DIVERSION.    Nausea/Vomiting: None    Postoperative hydration reviewed and adequate.    Pain:  Pain Scale 1: Numeric (0 - 10) (10/13/12 1845)  Pain Intensity 1: 4 (10/13/12 1845)   Managed    Neurological Status:   Neuro (WDL): Within Defined Limits (10/13/12 1845)  Neuro  Neurologic State: Alert;Sleeping (10/13/12 1845)   At baseline    Mental Status and Level of Consciousness: Alert and oriented     Pulmonary Status:   O2 Device: Nasal cannula (10/13/12 1845)   Adequate oxygenation and airway patent    Complications related to anesthesia: None    Post-anesthesia assessment completed. No concerns    Signed By: Dolly Rias, MD     October 13, 2012

## 2012-10-13 NOTE — Other (Signed)
Family updated at 2:16 PM by Sharla Kidney, RN.  AFTER PRIVACY CODE VERIFIED

## 2012-10-13 NOTE — Anesthesia Procedure Notes (Signed)
Epidural Block    Start time: 10/13/2012 11:20 AM  End time: 10/13/2012 11:28 AM  Reason for block: labor epidural  Staffing  Anesthesiologist: Renella Cunas D  Performed by: anesthesiologist   Prep  Risks and benefits discussed with the patient and plans are to proceed   Site marked, Timeout performed, 11:20  Patient was placed in seated positionThe back was prepped at the lumbar region  Prep Solution(s): Betadine  Epidural  Epidural Location: T11-12 (3 cc 1% lidocaine at insertion site)  Needle: 17G Tuohy  Attempts: needle passed 1 time(s) with loss of resistance using air  Catheter: 19 G epidural catheter placed/secured  No blood with aspiration, no cerebrospinal fluid with aspiration, no paresthesia and negative aspiration test  Test dose: lidocaine 1.5% w/ epi and negative  Assessment  Catheter was secured to back with tegaderm and tape  Insertion was uncomplicated and patient tolerated without any apparent complications

## 2012-10-13 NOTE — Progress Notes (Signed)
TRANSFER - IN REPORT:    Verbal report received from [facility]) on Cathy Jackson  being received from [employer]) for routine post - op      Report consisted of patient???s Situation, Background, Assessment and   Recommendations(SBAR).     Information from the following report(s) OR Summary, Intake/Output, MAR and Recent Results was reviewed with the receiving nurse.    Opportunity for questions and clarification was provided.      Assessment completed upon patient???s arrival to unit and care assumed.

## 2012-10-13 NOTE — Other (Signed)
Family updated at 2:31 PM by Sharla Kidney, RN.  AFTER PRIVACY CODE VERIFIED

## 2012-10-14 LAB — POC CG8I
Base excess (POC): 0 mmol/L
Base excess (POC): 0 mmol/L
Base excess (POC): 5 mmol/L
Calcium, ionized (POC): 1.09 MMOL/L — ABNORMAL LOW (ref 1.12–1.32)
Calcium, ionized (POC): 1.1 MMOL/L — ABNORMAL LOW (ref 1.12–1.32)
Calcium, ionized (POC): 1.14 MMOL/L (ref 1.12–1.32)
Glucose, POC: 144 MG/DL — ABNORMAL HIGH (ref 70–105)
Glucose, POC: 148 MG/DL — ABNORMAL HIGH (ref 70–105)
Glucose, POC: 152 MG/DL — ABNORMAL HIGH (ref 70–105)
HCO3 (POC): 24.8 MMOL/L (ref 22.0–26.0)
HCO3 (POC): 24.8 MMOL/L (ref 22.0–26.0)
HCO3 (POC): 28.4 MMOL/L — ABNORMAL HIGH (ref 22.0–26.0)
Potassium, POC: 3.1 MMOL/L — ABNORMAL LOW (ref 3.5–5.1)
Potassium, POC: 3.3 MMOL/L — ABNORMAL LOW (ref 3.5–5.1)
Potassium, POC: 3.4 MMOL/L — ABNORMAL LOW (ref 3.5–5.1)
Sodium, POC: 142 MMOL/L (ref 138–146)
Sodium, POC: 142 MMOL/L (ref 138–146)
Sodium, POC: 143 MMOL/L (ref 138–146)
pCO2 (POC): 37.8 MMHG (ref 35–45)
pCO2 (POC): 40 MMHG (ref 35–45)
pCO2 (POC): 41.8 MMHG (ref 35–45)
pH (POC): 7.381 (ref 7.35–7.45)
pH (POC): 7.425 (ref 7.35–7.45)
pH (POC): 7.459 — ABNORMAL HIGH (ref 7.35–7.45)
pO2 (POC): 196 MMHG — ABNORMAL HIGH (ref 80–105)
pO2 (POC): 229 MMHG — ABNORMAL HIGH (ref 80–105)
pO2 (POC): 314 MMHG — ABNORMAL HIGH (ref 80–105)
sO2 (POC): 100 % — ABNORMAL HIGH (ref 95–98)
sO2 (POC): 100 % — ABNORMAL HIGH (ref 95–98)
sO2 (POC): 100 % — ABNORMAL HIGH (ref 95–98)

## 2012-10-14 LAB — MRSA SCREEN - PCR (NASAL)

## 2012-10-14 LAB — CULTURE, URINE: Culture result:: 10000

## 2012-10-14 LAB — METABOLIC PANEL, BASIC
Anion gap: 7 mmol/L (ref 7–16)
BUN: 9 MG/DL (ref 8–23)
CO2: 24 MMOL/L (ref 21–32)
Calcium: 6.9 MG/DL — ABNORMAL LOW (ref 8.3–10.4)
Chloride: 108 MMOL/L — ABNORMAL HIGH (ref 98–107)
Creatinine: 0.87 MG/DL (ref 0.6–1.0)
GFR est AA: >60 mL/min/{1.73_m2} (ref >60)
GFR est non-AA: >60 mL/min/{1.73_m2} (ref >60)
Glucose: 174 MG/DL — ABNORMAL HIGH (ref 65–100)
Potassium: 3.6 MMOL/L (ref 3.5–5.1)
Sodium: 139 MMOL/L (ref 136–145)

## 2012-10-14 LAB — HGB & HCT
HCT: 29.1 % — ABNORMAL LOW (ref 35.8–46.3)
HGB: 9.4 g/dL — ABNORMAL LOW (ref 11.7–15.4)

## 2012-10-14 LAB — MAGNESIUM: Magnesium: 1 MG/DL — CL (ref 1.8–2.4)

## 2012-10-14 MED ADMIN — sodium chloride (NS) flush 5-10 mL: INTRAVENOUS | @ 12:00:00 | NDC 87701099893

## 2012-10-14 MED ADMIN — ropivacaine (PF) (NAROPIN) 0.1 %, fentaNYL citrate (PF) 5 mcg/mL in 0.9% sodium chloride 500 mL epidural infusion: EPIDURAL | @ 20:00:00 | NDC 61553022048

## 2012-10-14 MED ADMIN — ropivacaine (PF) (NAROPIN) 0.1 %, fentaNYL citrate (PF) 5 mcg/mL in 0.9% sodium chloride 500 mL epidural infusion: EPIDURAL | @ 12:00:00 | NDC 61553022048

## 2012-10-14 MED ADMIN — nalBUPHine (NUBAIN) injection 5 mg: INTRAVENOUS | @ 17:00:00 | NDC 00409146401

## 2012-10-14 MED ADMIN — acetaminophen (TYLENOL) tablet 650 mg: ORAL | @ 17:00:00 | NDC 00904198261

## 2012-10-14 MED ADMIN — levothyroxine (SYNTHROID) tablet 25 mcg: ORAL | @ 13:00:00 | NDC 00074455211

## 2012-10-14 MED ADMIN — ropivacaine (PF) (NAROPIN) 0.1 %, fentaNYL citrate (PF) 5 mcg/mL in 0.9% sodium chloride 500 mL epidural infusion: EPIDURAL | @ 02:00:00 | NDC 61553022048

## 2012-10-14 MED ADMIN — gabapentin (NEURONTIN) capsule 300 mg: ORAL | @ 14:00:00 | NDC 68084056311

## 2012-10-14 MED ADMIN — sodium chloride (NS) flush 5-10 mL: INTRAVENOUS | @ 03:00:00 | NDC 87701099893

## 2012-10-14 MED ADMIN — cefOXitin (MEFOXIN) 1 g in 0.9% sodium chloride (MBP/ADV) 50 mL MBP: INTRAVENOUS | @ 03:00:00 | NDC 60505075905

## 2012-10-14 MED ADMIN — gabapentin (NEURONTIN) capsule 300 mg: ORAL | @ 23:00:00 | NDC 00093103905

## 2012-10-14 MED ADMIN — magnesium sulfate 4 g/100 ml IVPB: INTRAVENOUS | @ 11:00:00 | NDC 00409672923

## 2012-10-14 MED ADMIN — venlafaxine-SR (EFFEXOR-XR) capsule 150 mg: ORAL | @ 13:00:00 | NDC 68084048611

## 2012-10-14 MED ADMIN — baclofen (LIORESAL) tablet 10 mg: ORAL | @ 21:00:00 | NDC 68084059911

## 2012-10-14 MED ADMIN — dextrose 5 % - 0.45% NaCl infusion: INTRAVENOUS | @ 21:00:00 | NDC 00409792609

## 2012-10-14 MED ADMIN — baclofen (LIORESAL) tablet 10 mg: ORAL | @ 14:00:00 | NDC 68084059911

## 2012-10-14 MED ADMIN — sodium chloride (NS) flush 5-10 mL: INTRAVENOUS | @ 19:00:00 | NDC 87701099893

## 2012-10-14 MED ADMIN — cefOXitin (MEFOXIN) 1 g in 0.9% sodium chloride (MBP/ADV) 50 mL MBP: INTRAVENOUS | @ 14:00:00 | NDC 60505075905

## 2012-10-14 MED ADMIN — dextrose 5 % - 0.45% NaCl infusion: INTRAVENOUS | @ 03:00:00 | NDC 00409792609

## 2012-10-14 MED ADMIN — diphenhydrAMINE (BENADRYL) injection 12.5 mg: INTRAVENOUS | @ 17:00:00 | NDC 63323066401

## 2012-10-14 MED ADMIN — dextrose 5 % - 0.45% NaCl infusion: INTRAVENOUS | @ 13:00:00 | NDC 00409792609

## 2012-10-14 MED ADMIN — morphine injection 2 mg: INTRAVENOUS | @ 12:00:00 | NDC 00409189001

## 2012-10-14 NOTE — Progress Notes (Signed)
TRANSFER - OUT REPORT:    Verbal report given to Tania Ade) on Cathy Jackson  being transferred to 603(unit) for routine progression of care       Report consisted of patient???s Situation, Background, Assessment and   Recommendations(SBAR).     Information from the following report(s) Kardex, OR Summary, Intake/Output, MAR and Recent Results was reviewed with the receiving nurse.    Opportunity for questions and clarification was provided.

## 2012-10-14 NOTE — Progress Notes (Signed)
Spiritual Care Assessment/Progress Notes    Cathy Jackson [id number]  [id number]    [birth year]  67 y.o.  female    Patient Telephone Number: [phone] (home)   Religious Affiliation: Pentecostal   Language: English   Extended Emergency Contact Information  Primary Emergency Contact: Buchholz,William  Address: [address]           [place], [place] [address] [place]  Home Phone: [phone]  Mobile Phone: [phone]  Relation: Spouse   Patient Active Problem List    Diagnosis Date Noted   ??? Bladder cancer 09/20/2012        Date: 10/14/2012       Level of Religious/Spiritual Activity:  [x]          Involved in faith tradition/spiritual practice    []          Not involved in faith tradition/spiritual practice  []          Spiritually oriented    []          Claims no spiritual orientation    []          seeking spiritual identity  []          Feels alienated from religious practice/tradition  []          Feels angry about religious practice/tradition  []          Spirituality/religious tradition is a Theatre stage manager for coping at this time.  []          Not able to assess due to medical condition    Services Provided Today:  []          crisis intervention    []          reading Scriptures  [x]          spiritual assessment    []          prayer  []          empathic listening/emotional support  []          rites and rituals (cite in comments)  []          life review     []          religious support  []          theological development   []          advocacy  []          ethical dialog     []          blessing  []          bereavement support    []          support to family  []          anticipatory grief support   []          help with AMD  []          spiritual guidance    []          meditation      Spiritual Care Needs  []          Emotional Support  []          Spiritual/Religious Care  []          Loss/Adjustment  []          Advocacy/Referral /Ethics  []          No needs expressed at this time  []          Other: (note in  comments)  Spiritual Care Plan  []   Follow up visits with pt/family  []          Provide materials  []          Schedule sacraments  []          Contact [facility] [employer]  [x]          Follow up as needed  []          Other: (note in comments)     No advance directives appear to be on file for patient with no code status listed.     Chaplain Obie Dredge, MDiv, [facility]

## 2012-10-14 NOTE — Progress Notes (Signed)
Patient assisted up to chair. Tolerated well.

## 2012-10-14 NOTE — Progress Notes (Signed)
Am labs drawn from art line. Peri pad and bed pad changed. Peri pad saturated. Pt able to turn side to side and assist with repositioning in bed.

## 2012-10-14 NOTE — Wound Image (Signed)
Patient seen for ileal conduit (new) post op care. Patient in ICU today. Reminded how to use PCA as she complained of patient and had forgotten she had this available. Pouch intact with viable stoma and amber urine in pouch. Hooked to bedside bag at present. Will continue to follow and start ostomy education tomorrow. Patient may transfer out of ICU today per nurse report.

## 2012-10-14 NOTE — Progress Notes (Signed)
Patient placed on oxinet #250, monitor room notified. 02 saturation 99% on 2L nc. No distress noted.

## 2012-10-14 NOTE — Progress Notes (Signed)
[facility] [facility] OUTREACH NURSE PROGRESS REPORT      SUBJECTIVE: Called to assess patient secondary to transfer from 3112.      MEWS Score: 2 (10/14/12 0400)  Filed Vitals:    10/14/12 1300 10/14/12 1315 10/14/12 1400 10/14/12 1533   BP: 110/53  98/50 114/64   Pulse: 100  101 96   Temp:  99.9 ??F (37.7 ??C)  98.7 ??F (37.1 ??C)   Resp: 14  20 20    Height:       Weight:       SpO2: 98%  98% 100%      EKG: normal EKG, normal sinus rhythm, unchanged from previous tracings.     LAB DATA:    Recent Labs      10/14/12   0400  10/13/12   1823   NA  139  142   K  3.6  4.1   CL  108*  111*   CO2  24  22   AGAP  7  9   GLU  174*  207*   BUN  9  7*   CREA  0.87  0.65   GFRAA  >60  >60   GFRNA  >60  >60   CA  6.9*  7.1*   MG  1.0*   --         Recent Labs      10/14/12   0400  10/13/12   1823   HGB  9.4*  11.0*   HCT  29.1*  34.3*          OBJECTIVE: On arrival to room, I found patient to be alert with husband at the bedside. .     Lungs: clear to auscultation bilaterally  Heart: regular rate and rhythm, S1, S2 normal, no murmur, click, rub or gallop  Midline abdominal incision c/d/i. LLQ JP intact. RLQ urostomy c/d/i       Pain Assessment  Pain Intensity 1: 0 (10/14/12 1519)  Pain Location 1: Abdomen  Pain Intervention(s) 1: Repositioned  Patient Stated Pain Goal: 0                                 ASSESSMENT:  Pt alert and oriented. HR nsr in 90's. O2 sat 100% on 3L nc. Pt has midline abdominal incision that is c/d/i. RLQ urostomy is draining yellow, clear, urine. llq JP patent, charged, and draining serosanguinous fluid. Pt has epidural in place. Pain controlled.     PLAN:  Continuous oxygen monitoring ordered per protocol due to epidural. Discussed with Primary RN. Will continue to follow up per outreach protocol.

## 2012-10-14 NOTE — Progress Notes (Signed)
Doing well.  Reports incisional pain despite having epidural.    AVSS.  Mild tachy.  Labs reviewed.    UOP 825 pink  JP 75 (serosang)  Abd soft, ND.  Urostomy pink and viable. Stents in place.  S/P Ant pelvic exonteration, PLND, IC POD#1  OOB.  Will consult PT.  Remove NG.  Keep NPO.  IS.  Repeat labs in am.

## 2012-10-14 NOTE — Consults (Signed)
LEAPFROG EVALUATION -- [facility]    The Patient is current in the ICU: s/p bladder cancer resection  He is being managed by:  urology  No need for any additional acute ICU interventions.  Patient is awake. Currently hemodynamically stable.  Please Call if Needed.  No Charge.    Deforrest Bogle Mcarthur Rossetti, MD

## 2012-10-14 NOTE — Progress Notes (Signed)
Pt is POD#1 s/p cystectomy. She has a thoracic epidural in place infusing ropivacaine/fentanyl mixture at basal rate of 6cc/hr with a  bolus of 3cc available Q 15 mins. Pt and nursing staff report good to excellent analgesia this am. Will continue at present rate.

## 2012-10-14 NOTE — Progress Notes (Signed)
Dr. Rozann Lesches in to see patient. Vaginal packing removed. New peri pad placed.

## 2012-10-14 NOTE — Progress Notes (Signed)
Dr Deloria Lair called with critical magnesium level. New order received for nutritional support set for replacement of electrolytes.

## 2012-10-14 NOTE — Progress Notes (Signed)
Problem: Mobility Impaired (Adult and Pediatric)  Goal: *Acute Goals and Plan of Care (Insert Text)  LTG:  (1.)Cathy Jackson will move from supine to sit and sit to supine via logrolling to side in flat bed without siderails with independence within 7 day(s).   (2.)Cathy Jackson will transfer from bed to chair and chair to bed with independence using the least restrictive/no device within 7 day(s).   (3.)Cathy Jackson will ambulate with supervision/set-up for 300+ feet with the least restrictive/no device within 7 day(s).   PHYSICAL THERAPY: INITIAL ASSESSMENT  INPATIENT: Medicare : Hospital Day: 2    NAME/AGE/GENDER: Cathy Jackson is Cathy Jackson 67 y.o. female  DATE: 10/14/2012  PRIMARY DIAGNOSIS: BLADDER CA  BLADDER CA  Procedure(s) (LRB):  ANTERIOR PELVIC EXONTERATION (N/Memori Sammon)  BILATERAL PELVIC LYMPH NODE DISSECTION (Bilateral)  ILEAL CONDUIT URINARY DIVERSION (N/Jakita Dutkiewicz) 1 Day Post-Op     INTERDISCIPLINARY COLLABORATION: Registered Nurse  ASSESSMENT:   Cathy Jackson was arriving to room via transporter.  She stood with CGA and ambulated 6' to bed with minimal assistance.  Patient performed sit to supine with max assist.   She has experienced Keela Rubert decline in functional mobility following surgery and would benefit from skilled PT to progress her to functioning independently to allow her to return home.      ????????This section established at most recent assessment??????????  PROBLEM LIST (Impairments causing functional limitations):  1. Decreased ADL/Functional Activities  2. Decreased Transfer Abilities  3. Decreased Ambulation Ability/Technique  4. Increased Pain affecting function  5. Decreased Activity Tolerance  6. Decreased Knowledge of precautions  REHABILITATION POTENTIAL FOR STATED GOALS: GOOD      PLAN OF CARE:   INTERVENTIONS PLANNED: (Benefits and precautions of physical therapy have been discussed with the patient.)  1. balance exercise  2. bed mobility  3. gait training  4. therapeutic activities  5. therapeutic  exercise/strengthening  6. transfer training  7. education  FREQUENCY/DURATION: Follow patient 1-2 times per day/4-7 days per week until goals are met in order to address above goals.    RECOMMENDED REHABILITATION/EQUIPMENT: (at time of discharge pending progress):   Home Health: Physical Therapy pending progress.  SUBJECTIVE:   "I take care of my husband"    Present Symptoms:    Pain Intensity 1: 0  Pain Location 1: Abdomen  Pain Intervention(s) 1: Repositioned  History of Present Injury/Illness: Admitted for above surgery.                    Prior Level of Function/Home Situation: Lives at home with husband who has CHF and she cares for him, daughter will stay with at discharge to help out.  Patient is normally independent in home and community.   Home Environment: Private residence  # Steps to Enter: 0  One/Two Story Residence: One story  Living Alone: No  Support Systems: Spouse;Child(ren)  Patient Expects to be Discharged to:: Private residence  Current DME Used/Available at Home: None  OBJECTIVE/TREATMENT:   (In addition to Assessment/Re-Assessment sessions the following treatments were rendered)                                      [facility]??? ???6 Clicks???  Basic Mobility Inpatient Short Form  How much difficulty does the patient currently have... Unable Quincie Haroon Lot Eliyahu Bille Little None   1.  Turning over in bed (including adjusting bedclothes, sheets and blankets)?   [ ]  1   [X]  2   [ ]  3   [ ]  4   2.  Sitting down on and standing up from Sharian Delia chair with arms ( e.g., wheelchair, bedside commode, etc.)   [ ]  1   [ ]  2   [X]  3   [ ]  4   3.  Moving from lying on back to sitting on the side of the bed?   [ ]  1   [X]  2   [ ]  3   [ ]  4               How much help from another person does the patient currently need... Total Jakeya Gherardi Lot Darsi Tien Little None   4.  Moving to and from Aralyn Nowak bed to Jaeveon Ashland chair (including Devony Mcgrady wheelchair)?   [ ]  1   [ ]  2   [X]  3   [ ]  4   5.  Need to walk in hospital room?    [ ]  1   [ ]  2   [X]  3   [ ]  4   6.  Climbing 3-5 steps with Berthold Glace railing?   [ ]  1   [ ]  2   [X]  3   [ ]  4   ?? 2007, Trustees of [address], under license to [place], [place]. All rights reserved       Score:  Initial: 16 Most Recent: X (Date: -- )   Interpretation of Tool:  Represents activities that are increasingly more difficult (i.e. Bed mobility, Transfers, Gait).  Score 24 23 22-20 19-15 14-10 9-7 6   Modifier CH CI CJ CK CL CM CN       ?? Mobility - Walking and Moving Around:              [phone] - CURRENT STATUS:        CK - 40%-59% impaired, limited or restricted              G8979 - GOAL STATUS:                CJ - 20%-39% impaired, limited or restricted              U0454 - D/C STATUS:                    ---------------To be determined---------------  Payor: HUMANA MEDICARE  Plan: BSHSI HUMANA MEDICARE HMO  Product Type: Managed Care Medicare     Most Recent Physical Functioning:   Gross Assessment:  AROM: Generally decreased, functional (except B shoulder flexion/abduction <90 degrees)  Strength: Generally decreased, functional  Posture:     Balance:  Sitting: Impaired  Sitting - Static: Prop sitting  Sitting - Dynamic: Prop sitting  Standing: Impaired  Standing - Static: Constant support  Standing - Dynamic : Fair  Bed Mobility:  Sit to Supine: Maximum assistance;Verbal cues  Wheelchair Mobility:     Transfers:  Sit to Stand: CGA;Additional time (from high surface)  Stand to Sit: CGA;Verbal cues  Bed to Chair: Minimum assistance;Additional time  Gait:     Base of Support: Widened  Speed/Cadence: Shuffled;Slow  Step Length: Left shortened;Right shortened  Distance (ft): 6 Feet (ft)  Assistive Device: Other (comment) (hand hold assist of  2)  Ambulation - Level of Assistance: Minimal assistance      Assessment/Reassessment only, no treatment provided today    Braces/Orthotics/Lines/Etc:   ?? IV  ?? urostomy  ?? O2 Device: Nasal cannula  ?? Epidural pain pump  Safety:   After treatment position/precautions:  ??  Supine in bed  ?? Bed/Chair-wheels locked  ?? Bed in low position  ?? Caregiver at bedside  ?? Call light within reach  ?? RN notified  Progression/Medical Necessity:   ?? Patient is expected to demonstrate progress in balance, coordination and functional technique to decrease assistance required with   and increase independence with all functional mobility.  ?? Patient demonstrates good rehab potential due to higher previous functional level.  Compliance with Program/Exercises: Will assess as treatment progresses.   Reason for Continuation of Services/Other Comments:  ?? Patient continues to require skilled intervention due to decline in functional mobility.  Recommendations/Intent for next treatment session: Treatment next visit will focus on advancements to more challenging activities and reduction in assistance provided.  Total Treatment Duration:  Time In: 1505  Time Out: 1515  Lenora Gomes Ananias Pilgrim, PT

## 2012-10-14 NOTE — Progress Notes (Signed)
Magnesium bolus in progress. Pt did not sleep well, only dozed off few times. Vital signs stable. Pain controled with PCA.

## 2012-10-14 NOTE — Progress Notes (Signed)
Pt assisted with turning to right side. T.V. On per pt request. Pt still c/o NG tube hurting her throat. No abdominal pain. Pt using PCA.

## 2012-10-14 NOTE — Progress Notes (Signed)
Critical Care Outreach Nurse Progress Report:    Subjective: In to assess pt secondary to f/u ICU transfer.  MEWS Score: 1 (10/14/12 1533)    Filed Vitals:    10/14/12 1400 10/14/12 1533 10/14/12 1715 10/14/12 2019   BP: 98/50 114/64  115/67   Pulse: 101 96  98   Temp:  98.7 ??F (37.1 ??C)  98.8 ??F (37.1 ??C)   Resp: 20 20  16    Height:       Weight:       SpO2: 98% 100% 99% 100%        Objective: Pt lying quietly in bed, in NAD.    Pain Intensity 1: 0 (10/14/12 1519)  Pain Location 1: Abdomen  Pain Intervention(s) 1: Repositioned  Patient Stated Pain Goal: 0    Assessment: A&Ox4.  Lung sounds clear.  O2 Sat 96-97% via oxinet on 2L NC.  Abdomen soft, tender.  Abd drsg cdi; LLQ JP patent/draining; RLQ urostomy site intact/draining.  Pt rates pain 6-7/10; using epidural pca for pain management.      Plan: Will continue to follow per outreach protocol.

## 2012-10-14 NOTE — Progress Notes (Signed)
TRANSFER - IN REPORT:    Verbal report received from [facility], RN(name) on Cathy Jackson  being received from ICU(unit) for routine progression of care      Report consisted of patient???s Situation, Background, Assessment and   Recommendations(SBAR).     Information from the following report(s) SBAR was reviewed with the receiving nurse.    Opportunity for questions and clarification was provided.      Assessment completed upon patient???s arrival to unit and care assumed.

## 2012-10-15 LAB — CBC W/O DIFF
HCT: 27.3 % — ABNORMAL LOW (ref 35.8–46.3)
HGB: 8.8 g/dL — ABNORMAL LOW (ref 11.7–15.4)
MCH: 28.6 PG (ref 26.1–32.9)
MCHC: 32.2 g/dL (ref 31.4–35.0)
MCV: 88.6 FL (ref 79.6–97.8)
MPV: 10.9 FL (ref 10.8–14.1)
PLATELET: 145 10*3/uL — ABNORMAL LOW (ref 150–450)
RBC: 3.08 M/uL — ABNORMAL LOW (ref 4.05–5.25)
RDW: 15.2 % — ABNORMAL HIGH (ref 11.9–14.6)
WBC: 10.4 10*3/uL (ref 4.3–11.1)

## 2012-10-15 LAB — METABOLIC PANEL, BASIC
Anion gap: 5 mmol/L — ABNORMAL LOW (ref 7–16)
BUN: 4 MG/DL — ABNORMAL LOW (ref 8–23)
CO2: 29 MMOL/L (ref 21–32)
Calcium: 7.6 MG/DL — ABNORMAL LOW (ref 8.3–10.4)
Chloride: 107 MMOL/L (ref 98–107)
Creatinine: 0.58 MG/DL — ABNORMAL LOW (ref 0.6–1.0)
GFR est AA: >60 mL/min/{1.73_m2} (ref >60)
GFR est non-AA: >60 mL/min/{1.73_m2} (ref >60)
Glucose: 146 MG/DL — ABNORMAL HIGH (ref 65–100)
Potassium: 3 MMOL/L — ABNORMAL LOW (ref 3.5–5.1)
Sodium: 141 MMOL/L (ref 136–145)

## 2012-10-15 LAB — MAGNESIUM: Magnesium: 1.8 MG/DL (ref 1.8–2.4)

## 2012-10-15 MED ADMIN — cefOXitin (MEFOXIN) 1 g in 0.9% sodium chloride (MBP/ADV) 50 mL MBP: INTRAVENOUS | @ 03:00:00 | NDC 60505075905

## 2012-10-15 MED ADMIN — dextrose 5 % - 0.45% NaCl infusion: INTRAVENOUS | @ 08:00:00 | NDC 00409792609

## 2012-10-15 MED ADMIN — baclofen (LIORESAL) tablet 10 mg: ORAL | @ 15:00:00 | NDC 68084059911

## 2012-10-15 MED ADMIN — levothyroxine (SYNTHROID) tablet 25 mcg: ORAL | @ 15:00:00 | NDC 00074455211

## 2012-10-15 MED ADMIN — cefOXitin (MEFOXIN) 1 g in 0.9% sodium chloride (MBP/ADV) 50 mL MBP: INTRAVENOUS | @ 15:00:00 | NDC 60505075905

## 2012-10-15 MED ADMIN — ropivacaine (PF) (NAROPIN) 0.1 %, fentaNYL citrate (PF) 5 mcg/mL in 0.9% sodium chloride 500 mL epidural infusion: EPIDURAL | @ 12:00:00 | NDC 61553022048

## 2012-10-15 MED ADMIN — montelukast (SINGULAIR) tablet 10 mg: ORAL | @ 03:00:00 | NDC 68084062011

## 2012-10-15 MED ADMIN — baclofen (LIORESAL) tablet 10 mg: ORAL | @ 23:00:00 | NDC 68084059911

## 2012-10-15 MED ADMIN — dextrose 5% - 0.45% NaCl with KCl 20 mEq/L infusion: INTRAVENOUS | @ 23:00:00 | NDC 00409790209

## 2012-10-15 MED ADMIN — baclofen (LIORESAL) tablet 10 mg: ORAL | @ 03:00:00 | NDC 68084059911

## 2012-10-15 MED ADMIN — gabapentin (NEURONTIN) capsule 300 mg: ORAL | @ 15:00:00 | NDC 68084056311

## 2012-10-15 MED ADMIN — ropivacaine (PF) (NAROPIN) 0.1 %, fentaNYL citrate (PF) 5 mcg/mL in 0.9% sodium chloride 500 mL epidural infusion: EPIDURAL | NDC 61553022048

## 2012-10-15 MED ADMIN — gabapentin (NEURONTIN) capsule 300 mg: ORAL | @ 23:00:00 | NDC 68084056311

## 2012-10-15 MED ADMIN — ropivacaine (PF) (NAROPIN) 0.1 %, fentaNYL citrate (PF) 5 mcg/mL in 0.9% sodium chloride 500 mL epidural infusion: EPIDURAL | @ 23:00:00 | NDC 61553022048

## 2012-10-15 MED ADMIN — dextrose 5% - 0.45% NaCl with KCl 20 mEq/L infusion: INTRAVENOUS | @ 15:00:00 | NDC 00409790209

## 2012-10-15 MED ADMIN — venlafaxine-SR (EFFEXOR-XR) capsule 150 mg: ORAL | @ 15:00:00 | NDC 68084048611

## 2012-10-15 MED ADMIN — sodium chloride (NS) flush 5-10 mL: INTRAVENOUS | @ 20:00:00 | NDC 87701099893

## 2012-10-15 MED ADMIN — diphenhydrAMINE (BENADRYL) injection 12.5 mg: INTRAVENOUS | @ 20:00:00 | NDC 63323066401

## 2012-10-15 NOTE — Progress Notes (Signed)
Date of Outreach Update:  Cathy Jackson was seen and assessed.  Previous Outreach assessment has been reviewed. Since the completion of the last dated Outreach assessment, pt states she has been oob to chair, as well as ambulated in hall.  Currently resting in bed.  Drains intact and draining well.  Incision open to air, staples intact.    Will continue to follow up per outreach protocol.    Signed By:   Juel Burrow, RN    October 15, 2012 2:04 PM

## 2012-10-15 NOTE — Progress Notes (Signed)
Problem: Mobility Impaired (Adult and Pediatric)  Goal: *Acute Goals and Plan of Care (Insert Text)  LTG:  (1.)Cathy Jackson will move from supine to sit and sit to supine via logrolling to side in flat bed without siderails with independence within 7 day(s).   (2.)Cathy Jackson will transfer from bed to chair and chair to bed with independence using the least restrictive/no device within 7 day(s).   (3.)Cathy Jackson will ambulate with supervision/set-up for 300+ feet with the least restrictive/no device within 7 day(s).   PHYSICAL THERAPY: Daily Note, Treatment Day: 1st and AM  INPATIENT: Medicare : Hospital Day: 3    NAME/AGE/GENDER: Cathy Jackson is a 67 y.o. female  DATE: 10/15/2012  PRIMARY DIAGNOSIS: BLADDER CA  BLADDER CA  Procedure(s) (LRB):  ANTERIOR PELVIC EXONTERATION (N/A)  BILATERAL PELVIC LYMPH NODE DISSECTION (Bilateral)  ILEAL CONDUIT URINARY DIVERSION (N/A) 2 Days Post-Op     INTERDISCIPLINARY COLLABORATION: Physical Therapy Assistant, Registered Nurse and Rehabilitation Attendant  ASSESSMENT:   Cathy Jackson was supine on contact.  Pt performed bed mobility and then transferred to chair with min/CGA.  Pt was taken to therapy gym and participated in group exs as below.  Pt stood and ambulated about 200' with RW and min/CGA.  Pt was able to increase her gait distance and activity today.  Pt would benefit from skilled PT to progress her to functioning independently to allow her to return home.      ????????This section established at most recent assessment??????????  PROBLEM LIST (Impairments causing functional limitations):  1. Decreased ADL/Functional Activities  2. Decreased Transfer Abilities  3. Decreased Ambulation Ability/Technique  4. Increased Pain affecting function  5. Decreased Activity Tolerance  6. Decreased Knowledge of precautions  REHABILITATION POTENTIAL FOR STATED GOALS: GOOD      PLAN OF CARE:   INTERVENTIONS PLANNED: (Benefits and precautions of physical therapy have been discussed  with the patient.)  1. balance exercise  2. bed mobility  3. gait training  4. therapeutic activities  5. therapeutic exercise/strengthening  6. transfer training  7. education  FREQUENCY/DURATION: Follow patient 1-2 times per day/4-7 days per week until goals are met in order to address above goals.    RECOMMENDED REHABILITATION/EQUIPMENT: (at time of discharge pending progress):   Home Health: Physical Therapy pending progress.  SUBJECTIVE:   "I take care of my husband"    Present Symptoms:    Pain Intensity 1: 0  Pain Location 1: Abdomen  Pain Intervention(s) 1: Repositioned  History of Present Injury/Illness: Admitted for above surgery.                    Prior Level of Function/Home Situation: Lives at home with husband who has CHF and she cares for him, daughter will stay with at discharge to help out.  Patient is normally independent in home and community.   Home Environment: Private residence  # Steps to Enter: 0  One/Two Story Residence: One story  Living Alone: No  Support Systems: Spouse;Child(ren)  Patient Expects to be Discharged to:: Private residence  Current DME Used/Available at Home: None  OBJECTIVE/TREATMENT:   (In addition to Assessment/Re-Assessment sessions the following treatments were rendered)                                      [facility]??? ???6 Clicks???  Basic Mobility Inpatient Short Form  How much difficulty does the patient currently have... Unable A Lot A Little None   1.  Turning over in bed (including adjusting bedclothes, sheets and blankets)?   [ ]  1   [X]  2   [ ]  3   [ ]  4   2.  Sitting down on and standing up from a chair with arms ( e.g., wheelchair, bedside commode, etc.)   [ ]  1   [ ]  2   [X]  3   [ ]  4   3.  Moving from lying on back to sitting on the side of the bed?   [ ]  1   [X]  2   [ ]  3   [ ]  4               How much help from another person does the patient currently need... Total A Lot A Little None   4.  Moving to and  from a bed to a chair (including a wheelchair)?   [ ]  1   [ ]  2   [X]  3   [ ]  4   5.  Need to walk in hospital room?   [ ]  1   [ ]  2   [X]  3   [ ]  4   6.  Climbing 3-5 steps with a railing?   [ ]  1   [ ]  2   [X]  3   [ ]  4   ?? 2007, Trustees of [address], under license to [place], [place]. All rights reserved       Score:  Initial: 16 Most Recent: X (Date: -- )   Interpretation of Tool:  Represents activities that are increasingly more difficult (i.e. Bed mobility, Transfers, Gait).  Score 24 23 22-20 19-15 14-10 9-7 6   Modifier CH CI CJ CK CL CM CN       ?? Mobility - Walking and Moving Around:              [phone] - CURRENT STATUS:        CK - 40%-59% impaired, limited or restricted              G8979 - GOAL STATUS:                CJ - 20%-39% impaired, limited or restricted              U0454 - D/C STATUS:                    ---------------To be determined---------------  Payor: HUMANA MEDICARE  Plan: BSHSI HUMANA MEDICARE HMO  Product Type: Managed Care Medicare     Most Recent Physical Functioning:   Gross Assessment:  AROM: Generally decreased, functional (except B shoulder flexion/abduction <90 degrees)  Strength: Generally decreased, functional  Posture:     Balance:  Sitting - Static: Good (unsupported)  Sitting - Dynamic: Fair (occasional)  Standing - Static: Fair  Standing - Dynamic : Fair  Bed Mobility:     Wheelchair Mobility:     Transfers:  Sit to Stand: CGA;Additional time;Verbal cues  Stand to Sit: CGA;Verbal cues  Gait:     Base of Support: Widened  Speed/Cadence: Pace decreased (<100 feet/min)  Step Length: Left shortened;Right shortened  Distance (ft): 200 Feet (ft)  Assistive Device: Walker, rolling  Ambulation - Level of Assistance: Minimal assistance;CGA      Therapeutic Activity: (    25 minutes):  Therapeutic activities including Bed transfers, Chair transfers and Ambulation on level ground to improve mobility, strength, balance and coordination.  Required minimal   to promote dynamic  balance in standing and promote coordination of bilateral, lower extremity(s).     Group Therapeutic Exercise: (  25 minutes):  Exercises per grid below to improve mobility and strength.  Required minimal visual, verbal and manual cues to promote proper body alignment and promote proper body posture.  Progressed range, repetitions and complexity of movement as indicated.     Date:  10/15/12 Date:   Date:     ACTIVITY/EXERCISE AM PM AM PM AM PM   Ambulation:           Distance  Device  Duration Group exs        Seated Heel Raises X 15 B        Seated Toe Raises X 15 B        Seated Long Arc Quads X 15 B        Seated Marching X 15 B        Seated Hip Abduction X 15 B        UE exs with red t-band X 15 B ea        B = bilateral; AA = active assistive; A = active; P = passive      Braces/Orthotics/Lines/Etc:   ?? IV  ?? urostomy  ?? O2 Device: Nasal cannula  ?? Epidural pain pump  Safety:   After treatment position/precautions:  ?? Up in chair, Bed/Chair-wheels locked, Call light within reach and RN notified  Progression/Medical Necessity:   ?? Patient is expected to demonstrate progress in balance, coordination and functional technique to decrease assistance required with   and increase independence with all functional mobility.  ?? Patient demonstrates good rehab potential due to higher previous functional level.  Compliance with Program/Exercises: Will assess as treatment progresses.   Reason for Continuation of Services/Other Comments:  ?? Patient continues to require skilled intervention due to decline in functional mobility.  Recommendations/Intent for next treatment session: Treatment next visit will focus on advancements to more challenging activities and reduction in assistance provided.  Total Treatment Duration:  Time In: 1000  Time Out: 1100  DAWN H BRACKEN, PTA

## 2012-10-15 NOTE — Progress Notes (Signed)
Doing well.  Pain controlled.  Denies flatus.  Tm 101.3 yest am.  Tc 98.8.  VSS  Abd soft, NT, ND.  Incision c/d/i  Urostomy pink and viable.  UOP pink and adequate.  JP 100 (serosang).  Labs reviewed.  Hgb 8.8  S/P Ant pelvic exonteration/IC POD#2  Ambulate.   Await flatus to start diet.  Repeat labs in am.  Replace K.

## 2012-10-15 NOTE — Progress Notes (Signed)
Date of Outreach Update:  [name] was seen and assessed.  Previous Outreach assessment has been reviewed.  There have been no significant clinical changes since the completion of the last dated Outreach assessment.  Pt resting quietly in bed, vss.      Will continue to follow up per outreach protocol.    Signed By:   Cranston Neighbor, RN    October 15, 2012 2:05 AM

## 2012-10-15 NOTE — Wound Image (Signed)
Daughter who was scheduled to come did come today.  Attempted to work with pt several times earlier today but pt is very drowsy .  Daughter, [facility],  here now so worked with her and demonstrated pouch change on model.  Pt slightly awake and listening.  Daughter seems to have understanding of care and plan to meet with her on Monday afternoon and change pouch on pt,  Pouch seal is good and urine is yellow and stents are intact.

## 2012-10-15 NOTE — Progress Notes (Signed)
[place] [facility] OUTREACH NURSE PROGRESS REPORT      SUBJECTIVE: Called to assess patient secondary to follow up on ICU transfer    MEWS Score: 1 (10/15/12 1621)  Filed Vitals:    10/15/12 0804 10/15/12 1138 10/15/12 1621 10/15/12 1953   BP: 102/49 100/65 108/60 122/57   Pulse: 97 108 99 102   Temp: 98.8 ??F (37.1 ??C) 98.4 ??F (36.9 ??C) 98.6 ??F (37 ??C) 99.2 ??F (37.3 ??C)   Resp: 18 18 18 15    Height:       Weight:       SpO2: 95% 95% 100% 99%      ST 103    LAB DATA:    Recent Labs      10/15/12   0553  10/14/12   0400  10/13/12   1823   NA  141  139  142   K  3.0*  3.6  4.1   CL  107  108*  111*   CO2  29  24  22    AGAP  5*  7  9   GLU  146*  174*  207*   BUN  4*  9  7*   CREA  0.58*  0.87  0.65   GFRAA  >60  >60  >60   GFRNA  >60  >60  >60   CA  7.6*  6.9*  7.1*   MG  1.8  1.0*   --         Recent Labs      10/15/12   0553  10/14/12   0400  10/13/12   1823   WBC  10.4   --    --    HGB  8.8*  9.4*  11.0*   HCT  27.3*  29.1*  34.3*   PLT  145*   --    --           OBJECTIVE: On arrival to room, I found patient to be supine, easily aroused but otherwise eyes closed. Answers questions appropriately and states pain is under control right now.          Pain Assessment  Pain Intensity 1: 0 (10/15/12 0714)  Pain Location 1: Abdomen  Pain Intervention(s) 1: Repositioned  Patient Stated Pain Goal: 0      ASSESSMENT:  Sat 95 on room air and 99 on 2 lpm. Epidural intact and OB at 30 degrees. Pain is controlled, she was able to ambulate today. Plans to dc epidural tomorrow.  Low grade temp and low grade tachycardia. Yellow clear urine. Family at bedside denies concern. She is able to move herself in bed. I encouraged her to do so frequently.    PLAN:  Cont to monitor temp. She is on oxinet to monitor sats.   Wyn Forster RN, MSN

## 2012-10-15 NOTE — Progress Notes (Signed)
Anesthesiology Epidural Follow-up    Post-op day 2.  S/p radicle cystectomy.  Pain is well controlled with epidural infusion.  Blood pressure stable. BP 102/49   Pulse 97   Temp(Src) 37.1 ??C (98.8 ??F)   Resp 18   Ht 5\' 2"  (1.575 m)   Wt 75.7 kg (166 lb 14.2 oz)   BMI 30.52 kg/m2   SpO2 95%. No motor weakness.  Epidural site ok with dressing intact. patient is doing well, pushes her button when she moves around.  Will leave the rate the same and continue for another day. Will follow.

## 2012-10-15 NOTE — Progress Notes (Signed)
Critical Care Outreach Nurse Progress Report:    Subjective: In to assess pt secondary to recent ICU transfer.  MEWS Score: 2 (10/14/12 2323)    Filed Vitals:    10/14/12 2019 10/14/12 2323 10/15/12 0504 10/15/12 0804   BP: 115/67 126/57 107/59 102/49   Pulse: 98 104 105 97   Temp: 98.8 ??F (37.1 ??C) 99.3 ??F (37.4 ??C) 100.3 ??F (37.9 ??C) 98.8 ??F (37.1 ??C)   Resp: 16 16 18 18    Height:       Weight:       SpO2: 100% 99% 93% 95%        Objective: resting quietly in bed; no acute distress noted.    Pain Intensity 1: 0 (10/15/12 0714)  Pain Location 1: Abdomen  Pain Intervention(s) 1: Repositioned  Patient Stated Pain Goal: 0    Assessment: awake, alert, oriented;  Denies pain at present, stating "only hurt when I move".  Epidural PCA intact, as well as urostomy and JP drains.  Urostomy draining pinkish-red urine.    Plan: continue to follow per protocol.

## 2012-10-16 LAB — TYPE + CROSSMATCH
ABO/Rh(D): A POS
Antibody screen: NEGATIVE
Status of unit: TRANSFUSED
Unit division: 0
Unit division: 0
Unit division: 0
Unit division: 0

## 2012-10-16 LAB — CBC W/O DIFF
HCT: 28.4 % — ABNORMAL LOW (ref 35.8–46.3)
HGB: 9.1 g/dL — ABNORMAL LOW (ref 11.7–15.4)
MCH: 28.4 PG (ref 26.1–32.9)
MCHC: 32 g/dL (ref 31.4–35.0)
MCV: 88.8 FL (ref 79.6–97.8)
MPV: 10.9 FL (ref 10.8–14.1)
PLATELET: 162 10*3/uL (ref 150–450)
RBC: 3.2 M/uL — ABNORMAL LOW (ref 4.05–5.25)
RDW: 14.8 % — ABNORMAL HIGH (ref 11.9–14.6)
WBC: 8.3 10*3/uL (ref 4.3–11.1)

## 2012-10-16 LAB — METABOLIC PANEL, BASIC
Anion gap: 7 mmol/L (ref 7–16)
BUN: 3 MG/DL — ABNORMAL LOW (ref 8–23)
CO2: 27 MMOL/L (ref 21–32)
Calcium: 7.6 MG/DL — ABNORMAL LOW (ref 8.3–10.4)
Chloride: 107 MMOL/L (ref 98–107)
Creatinine: 0.53 MG/DL — ABNORMAL LOW (ref 0.6–1.0)
GFR est AA: >60 mL/min/{1.73_m2} (ref >60)
GFR est non-AA: >60 mL/min/{1.73_m2} (ref >60)
Glucose: 149 MG/DL — ABNORMAL HIGH (ref 65–100)
Potassium: 3.3 MMOL/L — ABNORMAL LOW (ref 3.5–5.1)
Sodium: 141 MMOL/L (ref 136–145)

## 2012-10-16 LAB — CREATININE, FLUID: Creatinine, fluid: 0.66 MG/DL

## 2012-10-16 LAB — MAGNESIUM: Magnesium: 1.8 MG/DL (ref 1.8–2.4)

## 2012-10-16 MED ADMIN — HYDROmorphone (PF) (DILAUDID) injection 0.5 mg: INTRAVENOUS | @ 22:00:00 | NDC 00409128331

## 2012-10-16 MED ADMIN — montelukast (SINGULAIR) tablet 10 mg: ORAL | @ 03:00:00 | NDC 68084062011

## 2012-10-16 MED ADMIN — cefOXitin (MEFOXIN) 1 g in 0.9% sodium chloride (MBP/ADV) 50 mL MBP: INTRAVENOUS | @ 03:00:00 | NDC 00338055311

## 2012-10-16 MED ADMIN — baclofen (LIORESAL) tablet 10 mg: ORAL | @ 22:00:00 | NDC 68084059911

## 2012-10-16 MED ADMIN — sodium chloride (NS) flush 5-10 mL: INTRAVENOUS | @ 19:00:00 | NDC 87701099893

## 2012-10-16 MED ADMIN — levothyroxine (SYNTHROID) tablet 25 mcg: ORAL | @ 15:00:00 | NDC 00074455211

## 2012-10-16 MED ADMIN — sodium chloride (NS) flush 5-10 mL: INTRAVENOUS | @ 03:00:00 | NDC 87701099893

## 2012-10-16 MED ADMIN — gabapentin (NEURONTIN) capsule 300 mg: ORAL | @ 15:00:00 | NDC 68084056311

## 2012-10-16 MED ADMIN — dextrose 5% - 0.45% NaCl with KCl 20 mEq/L infusion: INTRAVENOUS | @ 08:00:00 | NDC 00409790209

## 2012-10-16 MED ADMIN — gabapentin (NEURONTIN) capsule 300 mg: ORAL | @ 22:00:00 | NDC 68084056311

## 2012-10-16 MED ADMIN — baclofen (LIORESAL) tablet 10 mg: ORAL | @ 03:00:00 | NDC 68084059911

## 2012-10-16 MED ADMIN — venlafaxine-SR (EFFEXOR-XR) capsule 150 mg: ORAL | @ 15:00:00 | NDC 68084048611

## 2012-10-16 MED ADMIN — dextrose 5% - 0.45% NaCl with KCl 20 mEq/L infusion: INTRAVENOUS | @ 20:00:00 | NDC 00409790209

## 2012-10-16 MED ADMIN — sodium chloride (NS) flush 5-10 mL: INTRAVENOUS | @ 11:00:00 | NDC 87701099893

## 2012-10-16 MED ADMIN — acetaminophen (TYLENOL) tablet 650 mg: ORAL | @ 04:00:00 | NDC 00904198261

## 2012-10-16 MED ADMIN — baclofen (LIORESAL) tablet 10 mg: ORAL | @ 15:00:00 | NDC 68084059911

## 2012-10-16 MED ADMIN — ropivacaine (PF) (NAROPIN) 0.1 %, fentaNYL citrate (PF) 5 mcg/mL in 0.9% sodium chloride 500 mL epidural infusion: EPIDURAL | @ 12:00:00 | NDC 61553022048

## 2012-10-16 MED ADMIN — cefOXitin (MEFOXIN) 1 g in 0.9% sodium chloride (MBP/ADV) 50 mL MBP: INTRAVENOUS | @ 15:00:00 | NDC 60505075905

## 2012-10-16 MED FILL — HYDROMORPHONE (PF) 1 MG/ML IJ SOLN: 1 mg/mL | INTRAMUSCULAR | Qty: 1

## 2012-10-16 NOTE — Progress Notes (Signed)
[place] [facility] NURSE PROGRESS REPORT      SUBJECTIVE: Follow-up visit     MEWS Score: 1 (10/16/12 0443)  Filed Vitals:    10/15/12 1953 10/15/12 2344 10/16/12 0443 10/16/12 1137   BP: 122/57 151/72 117/56 119/67   Pulse: 102 101 88 81   Temp: 99.2 ??F (37.3 ??C) 99.7 ??F (37.6 ??C) 99.1 ??F (37.3 ??C) 98.5 ??F (36.9 ??C)   Resp: 15 16 15 18    Height:       Weight:       SpO2: 99% 96% 95% 95%      LAB DATA:    Recent Labs      10/16/12   0610  10/15/12   0553  10/14/12   0400   NA  141  141  139   K  3.3*  3.0*  3.6   CL  107  107  108*   CO2  27  29  24    AGAP  7  5*  7   GLU  149*  146*  174*   BUN  3*  4*  9   CREA  0.53*  0.58*  0.87   GFRAA  >60  >60  >60   GFRNA  >60  >60  >60   CA  7.6*  7.6*  6.9*   MG  1.8  1.8  1.0*        Recent Labs      10/16/12   0610  10/15/12   0553  10/14/12   0400   WBC  8.3  10.4   --    HGB  9.1*  8.8*  9.4*   HCT  28.4*  27.3*  29.1*   PLT  162  145*   --           OBJECTIVE: On arrival to room, I found patient to be resting in bed with eyes closed   BP 119/67   Pulse 81   Temp(Src) 98.5 ??F (36.9 ??C)   Resp 18   Ht 5\' 2"  (1.575 m)   Wt 75.7 kg (166 lb 14.2 oz)   BMI 30.52 kg/m2   SpO2 95%  General:  Alert, cooperative, no distress.   Head:  Normocephalic, without obvious abnormality, atraumatic.   Eyes:  Conjunctivae/corneas clear. Pupils equal, round, reactive to light. Extraocular movements intact.   Lungs:   Clear to auscultation bilaterally.   Chest wall:  No tenderness or deformity.   Heart:  Regular rate and rhythm, S1, S2 normal, no murmur, click, rub, or gallop.   Abdomen:   Soft, non-tender. Bowel sounds normal. urostomy draining pink-tinged urine; left JP drain charged and patent   Extremities: Extremities normal, atraumatic, no cyanosis or edema.   Pulses: 2+ and symmetric all extremities.   Skin: Skin color, texture, turgor normal. No rashes or lesions.       ASSESSMENT:  Alert and oriented; denies SOB or chest pain; lung clear, c/o mild  surgical pain; epidural was d/c this AM; respirations even and unlabored; 90 ml out of left JP drain; urostomy site CDI draining pink-tinged urine    PLAN:  Pt has completed 48 hours of outreach RN protocol; primary RN to call if needed again. [phone]

## 2012-10-16 NOTE — Progress Notes (Addendum)
Pt ambulated halls with this RN's assistance.  Pt walked half way around the floor and back to her room.  Pt tolerated well.  No flatus.  Pain controlled with dilaudid prn.      Pt has used incentive spirometer several times throughout the shift.

## 2012-10-16 NOTE — Progress Notes (Signed)
RN encouraged Pt to stretch legs and feet often while in bed to help decrease chance of blood clots to legs.  Also encouraged to turn and position self often in bed to prevent skin breakdown areas to backside.  IS instructed and encouraged while at rest.  Encouraged to use call bell for all OOB assist for safety and reoriented to use PRN.  SR up x2.

## 2012-10-16 NOTE — Progress Notes (Signed)
Date of Outreach Update:  Cathy Jackson was seen and assessed.  Previous Outreach assessment has been reviewed.  There have been no significant clinical changes since the completion of the last dated Outreach assessment.    Will continue to follow up per outreach protocol.    Signed By:   Leonia Reader, RN    October 16, 2012 4:24 AM

## 2012-10-16 NOTE — Progress Notes (Signed)
Anesthesiology Epidural Follow-up    Post-op day 3.  S/p radical cystectomy.  Pain is well controlled with epidural infusion.  Blood pressure stable. BP 119/67   Pulse 81   Temp(Src) 36.9 ??C (98.5 ??F)   Resp 18   Ht 5\' 2"  (1.575 m)   Wt 75.7 kg (166 lb 14.2 oz)   BMI 30.52 kg/m2   SpO2 95%. No motor weakness.  Epidural cath removed with tip intact. Site ok. No apparent complications.

## 2012-10-16 NOTE — Progress Notes (Signed)
Denies flatus.  Ambulating.  Pain controlled.  AVSS  Abd soft, NT, ND.  Urostomy pink and viable.  Inc c/d/i  Labs reviewed.  K 3.3.  Hgb 9.1.  Urostomy 1000cc (clear)  JP 300 (serous)  S/P Ant pelvic exonteration POD#3  Await flatus to advance diet.  Ambulate.  Remove epidural.  Start pain meds.  JP Cr.

## 2012-10-16 NOTE — Progress Notes (Signed)
Oxycodone 5 mg given po as she requested for c/o abdominal incisional pain.

## 2012-10-16 NOTE — Progress Notes (Signed)
Norco 7.5 mg given po as she requested for c/o abdominal incisional pain.

## 2012-10-17 LAB — METABOLIC PANEL, BASIC
Anion gap: 7 mmol/L (ref 7–16)
BUN: 2 MG/DL — ABNORMAL LOW (ref 8–23)
CO2: 27 MMOL/L (ref 21–32)
Calcium: 7.8 MG/DL — ABNORMAL LOW (ref 8.3–10.4)
Chloride: 106 MMOL/L (ref 98–107)
Creatinine: 0.53 MG/DL — ABNORMAL LOW (ref 0.6–1.0)
GFR est AA: >60 mL/min/{1.73_m2} (ref >60)
GFR est non-AA: >60 mL/min/{1.73_m2} (ref >60)
Glucose: 144 MG/DL — ABNORMAL HIGH (ref 65–100)
Potassium: 3.5 MMOL/L (ref 3.5–5.1)
Sodium: 140 MMOL/L (ref 136–145)

## 2012-10-17 MED ADMIN — montelukast (SINGULAIR) tablet 10 mg: ORAL | @ 03:00:00 | NDC 68084062011

## 2012-10-17 MED ADMIN — oxyCODONE IR (ROXICODONE) tablet 5 mg: ORAL | @ 03:00:00 | NDC 68084035411

## 2012-10-17 MED ADMIN — venlafaxine-SR (EFFEXOR-XR) capsule 150 mg: ORAL | @ 13:00:00 | NDC 68084048611

## 2012-10-17 MED ADMIN — oxyCODONE IR (ROXICODONE) tablet 5 mg: ORAL | @ 11:00:00 | NDC 68084035411

## 2012-10-17 MED ADMIN — sodium chloride (NS) flush 5-10 mL: INTRAVENOUS | @ 03:00:00 | NDC 87701099893

## 2012-10-17 MED ADMIN — dextrose 5% - 0.45% NaCl with KCl 20 mEq/L infusion: INTRAVENOUS | @ 04:00:00 | NDC 00409790209

## 2012-10-17 MED ADMIN — sodium chloride (NS) flush 5-10 mL: INTRAVENOUS | @ 11:00:00 | NDC 87701099893

## 2012-10-17 MED ADMIN — cefOXitin (MEFOXIN) 1 g in 0.9% sodium chloride (MBP/ADV) 50 mL MBP: INTRAVENOUS | @ 03:00:00 | NDC 60505075905

## 2012-10-17 MED ADMIN — levothyroxine (SYNTHROID) tablet 25 mcg: ORAL | @ 13:00:00 | NDC 00074455211

## 2012-10-17 MED ADMIN — heparin (porcine) injection 5,000 Units: SUBCUTANEOUS | @ 13:00:00 | NDC 00069005904

## 2012-10-17 MED ADMIN — heparin (porcine) injection 5,000 Units: SUBCUTANEOUS | @ 03:00:00 | NDC 00069005904

## 2012-10-17 MED ADMIN — HYDROcodone-acetaminophen (NORCO) 7.5-325 mg per tablet 1 Tab: ORAL | @ 01:00:00 | NDC 00406036623

## 2012-10-17 MED ADMIN — cefOXitin (MEFOXIN) 1 g in 0.9% sodium chloride (MBP/ADV) 50 mL MBP: INTRAVENOUS | @ 13:00:00 | NDC 60505075905

## 2012-10-17 MED ADMIN — gabapentin (NEURONTIN) capsule 300 mg: ORAL | @ 13:00:00 | NDC 68084056311

## 2012-10-17 MED ADMIN — gabapentin (NEURONTIN) capsule 300 mg: ORAL | @ 22:00:00 | NDC 68084056311

## 2012-10-17 MED ADMIN — baclofen (LIORESAL) tablet 10 mg: ORAL | @ 13:00:00 | NDC 68084059911

## 2012-10-17 MED ADMIN — HYDROcodone-acetaminophen (NORCO) 7.5-325 mg per tablet 1 Tab: ORAL | @ 22:00:00 | NDC 00406036623

## 2012-10-17 MED ADMIN — baclofen (LIORESAL) tablet 10 mg: ORAL | @ 03:00:00 | NDC 68084059911

## 2012-10-17 MED ADMIN — sodium chloride (NS) flush 5-10 mL: INTRAVENOUS | @ 19:00:00 | NDC 87701099893

## 2012-10-17 MED ADMIN — dextrose 5% - 0.45% NaCl with KCl 20 mEq/L infusion: INTRAVENOUS | @ 12:00:00 | NDC 00409790209

## 2012-10-17 MED ADMIN — baclofen (LIORESAL) tablet 10 mg: ORAL | @ 22:00:00 | NDC 68084059911

## 2012-10-17 MED FILL — HEPARIN (PORCINE) 5,000 UNIT/ML IJ SOLN: 5000 unit/mL | INTRAMUSCULAR | Qty: 1

## 2012-10-17 MED FILL — HYDROCODONE-ACETAMINOPHEN 7.5 MG-325 MG TAB: ORAL | Qty: 1

## 2012-10-17 NOTE — Progress Notes (Signed)
Pt ambulated around room to chair with minimal assistance. Pt tolerated well.  Pt has not passed flatus. Incision c/d/i.  JP drain patent and constantly draining with peach colored output.

## 2012-10-17 NOTE — Progress Notes (Signed)
Doing well.  Pain controlled.  No flatus.  Ambulating well.  AVSS  Abd soft, NT, ND.  Inc c/d/i.  Urostomy viable.  BMP reviewed.  UOP 1980 (clear)  JP 485.  JP Cr 0.66  S/P Ant pelvic exont POD#4  Await flatus to start clears.  Ambulate/PT.  Recheck labs in am.

## 2012-10-17 NOTE — Progress Notes (Signed)
Pt has slept well in bed this shift, except during toileting and Vital Sign monitoring, in no apparent distress.  Resps even and non labored.  Safety maintained.  Medication regiment is effective for pain control.  No new problems noted.  RN rounded on this patient hourly.  Call bell in reach, SR up x 2. Oxycodone 5 mg given po as she requested for c/o abdominal incisional pain.

## 2012-10-18 LAB — METABOLIC PANEL, BASIC
Anion gap: 8 mmol/L (ref 7–16)
BUN: 4 MG/DL — ABNORMAL LOW (ref 8–23)
CO2: 26 MMOL/L (ref 21–32)
Calcium: 8 MG/DL — ABNORMAL LOW (ref 8.3–10.4)
Chloride: 108 MMOL/L — ABNORMAL HIGH (ref 98–107)
Creatinine: 0.61 MG/DL (ref 0.6–1.0)
GFR est AA: >60 mL/min/{1.73_m2} (ref >60)
GFR est non-AA: >60 mL/min/{1.73_m2} (ref >60)
Glucose: 113 MG/DL — ABNORMAL HIGH (ref 65–100)
Potassium: 3.7 MMOL/L (ref 3.5–5.1)
Sodium: 142 MMOL/L (ref 136–145)

## 2012-10-18 LAB — HGB & HCT
HCT: 30.3 % — ABNORMAL LOW (ref 35.8–46.3)
HGB: 9.7 g/dL — ABNORMAL LOW (ref 11.7–15.4)

## 2012-10-18 LAB — MAGNESIUM: Magnesium: 1.8 MG/DL (ref 1.8–2.4)

## 2012-10-18 MED ADMIN — cefOXitin (MEFOXIN) 1 g in 0.9% sodium chloride (MBP/ADV) 50 mL MBP: INTRAVENOUS | @ 04:00:00 | NDC 60505075905

## 2012-10-18 MED ADMIN — heparin (porcine) injection 5,000 Units: SUBCUTANEOUS | @ 14:00:00 | NDC 00069005904

## 2012-10-18 MED ADMIN — HYDROcodone-acetaminophen (NORCO) 7.5-325 mg per tablet 1 Tab: ORAL | @ 04:00:00 | NDC 00406036623

## 2012-10-18 MED ADMIN — cefOXitin (MEFOXIN) 1 g in 0.9% sodium chloride (MBP/ADV) 50 mL MBP: INTRAVENOUS | @ 14:00:00 | NDC 60505075905

## 2012-10-18 MED ADMIN — venlafaxine-SR (EFFEXOR-XR) capsule 150 mg: ORAL | @ 11:00:00 | NDC 68084048611

## 2012-10-18 MED ADMIN — HYDROmorphone (PF) (DILAUDID) injection 0.5 mg: INTRAVENOUS | @ 05:00:00 | NDC 00409128331

## 2012-10-18 MED ADMIN — baclofen (LIORESAL) tablet 10 mg: ORAL | @ 22:00:00 | NDC 68084059911

## 2012-10-18 MED ADMIN — dextrose 5% - 0.45% NaCl with KCl 20 mEq/L infusion: INTRAVENOUS | @ 11:00:00 | NDC 00409790209

## 2012-10-18 MED ADMIN — dextrose 5% - 0.45% NaCl with KCl 20 mEq/L infusion: INTRAVENOUS | @ 22:00:00 | NDC 00409790209

## 2012-10-18 MED ADMIN — heparin (porcine) injection 5,000 Units: SUBCUTANEOUS | @ 04:00:00 | NDC 00069005904

## 2012-10-18 MED ADMIN — levothyroxine (SYNTHROID) tablet 25 mcg: ORAL | @ 11:00:00 | NDC 00074455211

## 2012-10-18 MED ADMIN — baclofen (LIORESAL) tablet 10 mg: ORAL | @ 14:00:00 | NDC 68084059911

## 2012-10-18 MED ADMIN — dextrose 5% - 0.45% NaCl with KCl 20 mEq/L infusion: INTRAVENOUS | NDC 00409790209

## 2012-10-18 MED ADMIN — baclofen (LIORESAL) tablet 10 mg: ORAL | @ 04:00:00 | NDC 68084059911

## 2012-10-18 MED ADMIN — sodium chloride (NS) flush 5-10 mL: INTRAVENOUS | @ 04:00:00 | NDC 87701099893

## 2012-10-18 MED ADMIN — montelukast (SINGULAIR) tablet 10 mg: ORAL | @ 04:00:00 | NDC 68084062011

## 2012-10-18 MED ADMIN — gabapentin (NEURONTIN) capsule 300 mg: ORAL | @ 14:00:00 | NDC 68084056311

## 2012-10-18 MED ADMIN — HYDROcodone-acetaminophen (NORCO) 7.5-325 mg per tablet 1 Tab: ORAL | @ 22:00:00 | NDC 00406036623

## 2012-10-18 MED ADMIN — HYDROcodone-acetaminophen (NORCO) 7.5-325 mg per tablet 1 Tab: ORAL | @ 14:00:00 | NDC 00406036623

## 2012-10-18 MED ADMIN — gabapentin (NEURONTIN) capsule 300 mg: ORAL | @ 22:00:00 | NDC 68084056311

## 2012-10-18 MED ADMIN — venlafaxine-SR (EFFEXOR-XR) capsule 150 mg: ORAL | @ 12:00:00 | NDC 82804003830

## 2012-10-18 MED ADMIN — levothyroxine (SYNTHROID) tablet 25 mcg: ORAL | @ 13:00:00 | NDC 72865023790

## 2012-10-18 MED FILL — HYDROCODONE-ACETAMINOPHEN 7.5 MG-325 MG TAB: ORAL | Qty: 1

## 2012-10-18 MED FILL — HYDROMORPHONE (PF) 1 MG/ML IJ SOLN: 1 mg/mL | INTRAMUSCULAR | Qty: 1

## 2012-10-18 MED FILL — HEPARIN (PORCINE) 5,000 UNIT/ML IJ SOLN: 5000 unit/mL | INTRAMUSCULAR | Qty: 1

## 2012-10-18 NOTE — Progress Notes (Signed)
10-18-12  67 year old presented with bladder cancer.  All d/c options were discussed.  Pt expecting to return home with his spouse when stable.  Discussed with [facility] hospital physical therapist, recommend a rolling walker for the pt. Spouse at the bedside confirms pt does not have a rolling walker at home.  Presented the pt with a list of DME providers as generated through [email or web address]  Pt selected [facility]  Referred to [facility], rolling walker will be delivered to the pt's room prior to d/c.  Roseanne Reno, BSW

## 2012-10-18 NOTE — Progress Notes (Signed)
RD agrees with intern's assessment and plan.  Anaia Frith, RD, LD, CNSC [phone]

## 2012-10-18 NOTE — Progress Notes (Signed)
Problem: Mobility Impaired (Adult and Pediatric)  Goal: *Acute Goals and Plan of Care (Insert Text)  LTG:  (1.)[name] will move from supine to sit and sit to supine via logrolling to side in flat bed without siderails with independence within 7 day(s).   (2.)[name] will transfer from bed to chair and chair to bed with independence using the least restrictive/no device within 7 day(s).   (3.)[name] will ambulate with supervision/set-up for 300+ feet with the least restrictive/no device within 7 day(s).   PHYSICAL THERAPY: Daily Note, Treatment Day: 2nd and AM  INPATIENT: Medicare : Hospital Day: 6    NAME/AGE/GENDER: Cathy Jackson is a 67 y.o. female  DATE: 10/18/2012  PRIMARY DIAGNOSIS: BLADDER CA  BLADDER CA  Procedure(s) (LRB):  ANTERIOR PELVIC EXONTERATION (N/A)  BILATERAL PELVIC LYMPH NODE DISSECTION (Bilateral)  ILEAL CONDUIT URINARY DIVERSION (N/A) 5 Days Post-Op     INTERDISCIPLINARY COLLABORATION: Registered Nurse and Social Worker  ASSESSMENT:   Cathy Jackson presents in supine, agreeable to PT treatment. Complaining of generally not feeling well with some dizziness that she reports has been having for past day or two. BP initially 123/74 pre-activity, 155/82 post activity. She demonstrates improved independence and safety with bed mobility, transfers, and gait. Able to significantly increase gait distance in hallway, ambulating 500 ft with walker and CGA-SBA. Is fatigued towards end of treatment and requests to rest afterwards. She is currently functioning below her baseline as she is very independent and typically helps husband with everything at home, however is ambulating well with walker and balance improving. Suggested RW for home use, patient in agreement and discussed with SW. Will continue with efforts during hospital stay.       ????????This section established at most recent assessment??????????  PROBLEM LIST (Impairments causing functional limitations):  1. Decreased  ADL/Functional Activities  2. Decreased Transfer Abilities  3. Decreased Ambulation Ability/Technique  4. Increased Pain affecting function  5. Decreased Activity Tolerance  6. Decreased Knowledge of precautions  REHABILITATION POTENTIAL FOR STATED GOALS: GOOD      PLAN OF CARE:   INTERVENTIONS PLANNED: (Benefits and precautions of physical therapy have been discussed with the patient.)  1. balance exercise  2. bed mobility  3. gait training  4. therapeutic activities  5. therapeutic exercise/strengthening  6. transfer training  7. education  FREQUENCY/DURATION: Follow patient 1-2 times per day/4-7 days per week until goals are met in order to address above goals.    RECOMMENDED REHABILITATION/EQUIPMENT: (at time of discharge pending progress):   Home Health: Physical Therapy; Rolling walker (discussed with SW)  SUBJECTIVE:   "I've always done everything for my husband"    Present Symptoms:  Generally not feeling well  Pain Intensity 1: 0  Pain Location 1: Abdomen;Incisional  Pain Orientation 1: Anterior  Pain Intervention(s) 1: Medication (see MAR)  History of Present Injury/Illness: Admitted for above surgery.                    Prior Level of Function/Home Situation: Lives at home with husband who has CHF and she cares for him, daughter will stay with at discharge to help out.  Patient is normally independent in home and community.   Home Environment: Private residence  # Steps to Enter: 0  One/Two Story Residence: One story  Living Alone: No  Support Systems: Spouse;Child(ren)  Patient Expects to be Discharged to:: Private residence  Current DME Used/Available at Home: None  OBJECTIVE/TREATMENT:   (In addition to  Assessment/Re-Assessment sessions the following treatments were rendered)                                      [facility]?? ???6 Clicks???                                          Basic Mobility Inpatient Short Form  How much difficulty does the patient currently have... Unable A Lot A Little  None   1.  Turning over in bed (including adjusting bedclothes, sheets and blankets)?   [ ]  1   [X]  2   [ ]  3   [ ]  4   2.  Sitting down on and standing up from a chair with arms ( e.g., wheelchair, bedside commode, etc.)   [ ]  1   [ ]  2   [X]  3   [ ]  4   3.  Moving from lying on back to sitting on the side of the bed?   [ ]  1   [X]  2   [ ]  3   [ ]  4               How much help from another person does the patient currently need... Total A Lot A Little None   4.  Moving to and from a bed to a chair (including a wheelchair)?   [ ]  1   [ ]  2   [X]  3   [ ]  4   5.  Need to walk in hospital room?   [ ]  1   [ ]  2   [X]  3   [ ]  4   6.  Climbing 3-5 steps with a railing?   [ ]  1   [ ]  2   [X]  3   [ ]  4   ?? 2007, Trustees of [address], under license to [employer], [place]. All rights reserved       Score:  Initial: 16 Most Recent: X (Date: -- )   Interpretation of Tool:  Represents activities that are increasingly more difficult (i.e. Bed mobility, Transfers, Gait).  Score 24 23 22-20 19-15 14-10 9-7 6   Modifier CH CI CJ CK CL CM CN       ?? Mobility - Walking and Moving Around:              [phone] - CURRENT STATUS:        CK - 40%-59% impaired, limited or restricted              [id number] - GOAL STATUS:                CJ - 20%-39% impaired, limited or restricted              [id number] - D/C STATUS:                    ---------------To be determined---------------  Payor: HU[employer] MEDICARE  Plan: [employer] MEDICARE HMO  Product Type: Managed Care Medicare     Most Recent Physical Functioning:   Gross Assessment:  AROM: Generally decreased, functional (except B shoulder flexion/abduction <90 degrees)  Strength: Generally decreased, functional  Posture:     Balance:  Sitting: Intact  Sitting - Static: Good (unsupported)  Sitting - Dynamic: Good (  unsupported)  Standing: Impaired  Standing - Static: Fair  Standing - Dynamic : Fair  Bed Mobility:  Rolling: Modified independence, requires equipment;Additional time  Supine to Sit:  Modified independence, requires equipment;Additional time;Verbal cues  Scooting: Independent (scooting to edge of bed in sitting)  Interventions: Verbal cues;Visual cues  Wheelchair Mobility:     Transfers:  Sit to Stand: CGA;Verbal cues  Stand to Sit: CGA;Supervision;Verbal cues  Bed to Chair: CGA;Verbal cues  Interventions: Safety awareness training;Tactile cues;Verbal cues;Visual cues  Duration: 35 Minutes  Gait:     Speed/Cadence: Pace decreased (<100 feet/min)  Step Length: Left shortened;Right shortened  Distance (ft): 500 Feet (ft)  Assistive Device: Walker, rolling  Ambulation - Level of Assistance: CGA;SBA      Therapeutic Activity: (  35 Minutes ):  Therapeutic activities including Bed transfers, Seated balance activities and ADLs, Chair transfers and Ambulation on level ground to improve mobility, strength, balance, coordination and activity tolerance.  Required minimal   to promote dynamic balance in standing and to address use of rolling walker for safety. She is able to significantly progress her gait distance today; cueing for upright posture, activity pacing, and safety. Worked on log roll technique to decrease pain with bed mobility. Worked on bathing tasks while sitting unsupported at edge of bed.           Group Therapeutic Exercise: ( ):  Exercises per grid below to improve mobility and strength.  Required minimal visual, verbal and manual cues to promote proper body alignment and promote proper body posture.  Progressed range, repetitions and complexity of movement as indicated.     Date:  10/15/12 Date:   Date:     ACTIVITY/EXERCISE AM PM AM PM AM PM   Ambulation:           Distance  Device  Duration Group exs        Seated Heel Raises X 15 B        Seated Toe Raises X 15 B        Seated Long Arc Quads X 15 B        Seated Marching X 15 B        Seated Hip Abduction X 15 B        UE exs with red t-band X 15 B ea        B = bilateral; AA = active assistive; A = active; P = passive       Braces/Orthotics/Lines/Etc:   ?? urostomy    Safety:   After treatment position/precautions:  ?? Up in chair, Bed/Chair-wheels locked, Call light within reach and RN notified  Progression/Medical Necessity:   ?? Patient is expected to demonstrate progress in balance, coordination and functional technique to decrease assistance required with   and increase independence with all functional mobility.  ?? Patient demonstrates good rehab potential due to higher previous functional level.  Compliance with Program/Exercises: Will assess as treatment progresses.   Reason for Continuation of Services/Other Comments:  ?? Patient continues to require skilled intervention due to decline in functional mobility.  Recommendations/Intent for next treatment session: Treatment next visit will focus on advancements to more challenging activities and reduction in assistance provided.  Total Treatment Duration:  Time In: 0910  Time Out: 0945  Corey Harold, PT

## 2012-10-18 NOTE — Wound Image (Signed)
Pt seems more alert today but not participating much in care .  Demonstrated pouch change for pt and daughter.  Stoma is red and moist and stents are intact.  Peri-stomal skin intact.  Daughter voices understanding of care and will practice with pt mor tomorrow on model.

## 2012-10-18 NOTE — Progress Notes (Signed)
Doing well.  No flatus.  Pain controlled.  AVSS  JP 220  UOP 1550 and clear.  Labs reviewed.  Abd soft, NT, ND.  Urostomy viable.  Inc c/d/i  S/P Ant pelvic exonteration POD#5  Await flatus to start clears.  Remove JP  Ambulate

## 2012-10-18 NOTE — Progress Notes (Addendum)
Problem: Nutrition Deficit  Goal: *Optimize nutritional status  Nutrition  Reason for assessment: Day 5 NPO status identified as per standard of care monitoring.   Assessment:   Food/Nutrition, Patient and Pertinent History: Diet: NPO. Pt presented with TI grade 3 bladder cancer. Pt is s/p 5 days anterior pelvic exenteration procedure with urethrectomy, bilateral pelvic lymph node dissection, and ileal conduit urinary diversion.   PMH: GERD, TURBT per 09/08/11 H&P   Pertinent Medications: D5%,0.45% NS with KCl mEq/L at 125 ml/hr  Pertinent Labs: glucose range on BMP: 117-207: 113, BUN: 4 (potentially c/w decline in protein status)   Anthropometrics: Height 5'2",  weight 154#, 70 kg (pre-surgical wt per standing scale), BMI 28.3 c/w overweight  Macronutrient needs:              EER:  1400-1750 kcal /day (20-25 kcal/kg Actual BW)              EPR:  60-75 grams protein/day (1.2-1.5 grams/kg IBW)              Max CHO:  288 grams/day (4mg /kg IBW/min)                         Fluid:  65ml/kcal    Nutrition Diagnosis: Inadequate protein-energy intake r/t alteration in GI function as evidenced by NPO and post-op status while awaiting return of GI function.    Intervention:  Goal(s): Diet tolerance of  >50% of full liquid diet or > within 3-5 days.  Plan:   1. Await initiation of p.o. diet and modify per preferences and tolerances.  2. If it is expected that patient will be unable to tolerate p.o. diet >50% of full liquid diet or > within 3-5 days, consider TPN.    Monitoring/Evaluation:   F/U for diet initiation, progression, tolerance and adequacy of intake.    Myrtie Cruise, Agricultural consultant

## 2012-10-19 MED ADMIN — gabapentin (NEURONTIN) capsule 300 mg: ORAL | @ 22:00:00 | NDC 68084056311

## 2012-10-19 MED ADMIN — dextrose 5% - 0.45% NaCl with KCl 20 mEq/L infusion: INTRAVENOUS | @ 14:00:00 | NDC 72439050041

## 2012-10-19 MED ADMIN — heparin (porcine) injection 5,000 Units: SUBCUTANEOUS | @ 15:00:00 | NDC 00069005904

## 2012-10-19 MED ADMIN — HYDROcodone-acetaminophen (NORCO) 7.5-325 mg per tablet 1 Tab: ORAL | @ 12:00:00 | NDC 00406036623

## 2012-10-19 MED ADMIN — dextrose 5% - 0.45% NaCl with KCl 20 mEq/L infusion: INTRAVENOUS | @ 12:00:00 | NDC 00409790209

## 2012-10-19 MED ADMIN — sodium chloride (NS) flush 5-10 mL: INTRAVENOUS | @ 11:00:00 | NDC 87701099893

## 2012-10-19 MED ADMIN — heparin (porcine) injection 5,000 Units: SUBCUTANEOUS | @ 04:00:00 | NDC 00069005904

## 2012-10-19 MED ADMIN — venlafaxine-SR (EFFEXOR-XR) capsule 150 mg: ORAL | @ 12:00:00 | NDC 68084048611

## 2012-10-19 MED ADMIN — sodium chloride (NS) flush 5-10 mL: INTRAVENOUS | @ 19:00:00 | NDC 87701099893

## 2012-10-19 MED ADMIN — HYDROcodone-acetaminophen (NORCO) 7.5-325 mg per tablet 1 Tab: ORAL | @ 04:00:00 | NDC 00406036623

## 2012-10-19 MED ADMIN — baclofen (LIORESAL) tablet 10 mg: ORAL | @ 15:00:00 | NDC 68084059911

## 2012-10-19 MED ADMIN — dextrose 5% - 0.45% NaCl with KCl 20 mEq/L infusion: INTRAVENOUS | NDC 00409790209

## 2012-10-19 MED ADMIN — montelukast (SINGULAIR) tablet 10 mg: ORAL | @ 04:00:00 | NDC 68084062011

## 2012-10-19 MED ADMIN — baclofen (LIORESAL) tablet 10 mg: ORAL | @ 22:00:00 | NDC 68084059911

## 2012-10-19 MED ADMIN — gabapentin (NEURONTIN) capsule 300 mg: ORAL | @ 15:00:00 | NDC 68084056311

## 2012-10-19 MED ADMIN — cefOXitin (MEFOXIN) 1 g in 0.9% sodium chloride (MBP/ADV) 50 mL MBP: INTRAVENOUS | @ 04:00:00 | NDC 60505075905

## 2012-10-19 MED ADMIN — cefOXitin (MEFOXIN) 1 g in 0.9% sodium chloride (MBP/ADV) 50 mL MBP: INTRAVENOUS | @ 15:00:00 | NDC 60505075905

## 2012-10-19 MED ADMIN — levothyroxine (SYNTHROID) tablet 25 mcg: ORAL | @ 12:00:00 | NDC 00074455211

## 2012-10-19 MED ADMIN — HYDROcodone-acetaminophen (NORCO) 7.5-325 mg per tablet 1 Tab: ORAL | @ 19:00:00 | NDC 00406036623

## 2012-10-19 MED FILL — HYDROCODONE-ACETAMINOPHEN 7.5 MG-325 MG TAB: ORAL | Qty: 1

## 2012-10-19 MED FILL — HEPARIN (PORCINE) 5,000 UNIT/ML IJ SOLN: 5000 unit/mL | INTRAMUSCULAR | Qty: 1

## 2012-10-19 MED FILL — D5-1/2 NS & POTASSIUM CHLORIDE 20 MEQ/L IV: 20 mEq/L | INTRAVENOUS | Qty: 1000

## 2012-10-19 NOTE — Progress Notes (Signed)
10-19-12  Social Worker received a call from [employer], DME company does not accept the [employer].  DME in network facilities [employer] and [place].  The pt selected [facility] at [phone], referral submitted and forwarded.  SW requested the delivery of DME to the pt's hospital room.  Roseanne Reno, BSW

## 2012-10-19 NOTE — Progress Notes (Signed)
Doing well.  No flatus.  Ambulating well.  AVSS  Abd soft, NT, ND.  Inc c/d/i.  BS present.  Urostomy viable.  UOP stable and clear.  No new labs.  S/P Ant pelvic exont POD#6  Will start sips of clears.  Recheck labs in am.

## 2012-10-19 NOTE — Progress Notes (Signed)
Problem: Mobility Impaired (Adult and Pediatric)  Goal: *Acute Goals and Plan of Care (Insert Text)  LTG:  (1.)Ms. Whitsel will move from supine to sit and sit to supine via logrolling to side in flat bed without siderails with independence within 7 day(s).   (2.)Ms. Burdell will transfer from bed to chair and chair to bed with independence using the least restrictive/no device within 7 day(s).   (3.)Ms. Loh will ambulate with supervision/set-up for 300+ feet with the least restrictive/no device within 7 day(s).   PHYSICAL THERAPY: Daily Note, Treatment Day: 3rd and PM  INPATIENT: Medicare : Hospital Day: 7    NAME/AGE/GENDER: SHARONICA KRASZEWSKI is a 67 y.o. female  DATE: 10/19/2012  PRIMARY DIAGNOSIS: BLADDER CA  BLADDER CA  Procedure(s) (LRB):  ANTERIOR PELVIC EXONTERATION (N/A)  BILATERAL PELVIC LYMPH NODE DISSECTION (Bilateral)  ILEAL CONDUIT URINARY DIVERSION (N/A) 6 Days Post-Op     INTERDISCIPLINARY COLLABORATION: Physical Therapy Assistant, Registered Nurse, Rehabilitation Attendant and Certified Nursing Assistant/Patient Care Technician  ASSESSMENT:   Ms. Mcgann presents in supine, agreeable to PT treatment. Pt ambulates in hallway 225 ft with rolling walker and CGA-SBA.  Pt participated in group exs as below and then ambulated back to her room.  She is currently functioning below her baseline as she is very independent and typically helps husband with everything at home, however is ambulating well with walker and balance improving. Suggested RW for home use, patient in agreement and discussed with SW. Will continue with efforts during hospital stay.       ????????This section established at most recent assessment??????????  PROBLEM LIST (Impairments causing functional limitations):  1. Decreased ADL/Functional Activities  2. Decreased Transfer Abilities  3. Decreased Ambulation Ability/Technique  4. Increased Pain affecting function  5. Decreased Activity Tolerance  6. Decreased Knowledge of  precautions  REHABILITATION POTENTIAL FOR STATED GOALS: GOOD      PLAN OF CARE:   INTERVENTIONS PLANNED: (Benefits and precautions of physical therapy have been discussed with the patient.)  1. balance exercise  2. bed mobility  3. gait training  4. therapeutic activities  5. therapeutic exercise/strengthening  6. transfer training  7. education  FREQUENCY/DURATION: Follow patient 1-2 times per day/4-7 days per week until goals are met in order to address above goals.    RECOMMENDED REHABILITATION/EQUIPMENT: (at time of discharge pending progress):   Home Health: Physical Therapy; Rolling walker  SUBJECTIVE:   "I've always done everything for my husband"    Present Symptoms:  Generally not feeling well  Pain Intensity 1: 8  Pain Location 1: Abdomen  Pain Orientation 1: Anterior  Pain Intervention(s) 1: Medication (see MAR);Rest  History of Present Injury/Illness: Admitted for above surgery.                    Prior Level of Function/Home Situation: Lives at home with husband who has CHF and she cares for him, daughter will stay with at discharge to help out.  Patient is normally independent in home and community.   Home Environment: Private residence  # Steps to Enter: 0  One/Two Story Residence: One story  Living Alone: No  Support Systems: Spouse;Child(ren)  Patient Expects to be Discharged to:: Private residence  Current DME Used/Available at Home: None  OBJECTIVE/TREATMENT:   (In addition to Assessment/Re-Assessment sessions the following treatments were rendered)  [employer] AM-PAC??? ???6 Clicks???                                          Basic Mobility Inpatient Short Form  How much difficulty does the patient currently have... Unable A Lot A Little None   1.  Turning over in bed (including adjusting bedclothes, sheets and blankets)?   [ ]  1   [X]  2   [ ]  3   [ ]  4   2.  Sitting down on and standing up from a chair with arms ( e.g., wheelchair, bedside commode, etc.)   [ ]   1   [ ]  2   [X]  3   [ ]  4   3.  Moving from lying on back to sitting on the side of the bed?   [ ]  1   [X]  2   [ ]  3   [ ]  4               How much help from another person does the patient currently need... Total A Lot A Little None   4.  Moving to and from a bed to a chair (including a wheelchair)?   [ ]  1   [ ]  2   [X]  3   [ ]  4   5.  Need to walk in hospital room?   [ ]  1   [ ]  2   [X]  3   [ ]  4   6.  Climbing 3-5 steps with a railing?   [ ]  1   [ ]  2   [X]  3   [ ]  4   ?? 2007, Trustees of [address], under license to [place], [place]. All rights reserved       Score:  Initial: 16 Most Recent: X (Date: -- )   Interpretation of Tool:  Represents activities that are increasingly more difficult (i.e. Bed mobility, Transfers, Gait).  Score 24 23 22-20 19-15 14-10 9-7 6   Modifier CH CI CJ CK CL CM CN       ?? Mobility - Walking and Moving Around:              [phone] - CURRENT STATUS:        CK - 40%-59% impaired, limited or restricted              [id number] - GOAL STATUS:                CJ - 20%-39% impaired, limited or restricted              U0454 - D/C STATUS:                    ---------------To be determined---------------  Payor: HU[employer]NA MEDICARE  Plan: [employer] HU[employer] MEDICARE HMO  Product Type: Managed Care Medicare     Most Recent Physical Functioning:   Gross Assessment:  AROM: Generally decreased, functional (except B shoulder flexion/abduction <90 degrees)  Strength: Generally decreased, functional  Posture:     Balance:  Sitting - Static: Good (unsupported)  Sitting - Dynamic: Good (unsupported)  Standing - Static: Fair (+)  Standing - Dynamic : Fair  Bed Mobility:  Supine to Sit: Modified independence, requires equipment  Sit to Supine: Minimum assistance  Wheelchair Mobility:     Transfers:  Sit to Stand: CGA;Verbal cues  Stand to  Sit: CGA;Supervision  Interventions: Safety awareness training;Verbal cues  Gait:     Base of Support: Widened  Speed/Cadence: Pace decreased (<100 feet/min)  Step Length: Left  shortened;Right shortened  Distance (ft): 225 Feet (ft) (x 2)  Assistive Device: Walker, rolling  Ambulation - Level of Assistance: CGA;SBA      Therapeutic Activity: (   24 minutes ):  Therapeutic activities including Bed transfers, Seated balance activities and ADLs, Chair transfers and Ambulation on level ground to improve mobility, strength, balance, coordination and activity tolerance.  Required minimal   to promote dynamic balance in standing and to address use of rolling walker for safety. She is able to significantly progress her gait distance today; cueing for upright posture, activity pacing, and safety. Worked on log roll technique to decrease pain with bed mobility. Worked on bathing tasks while sitting unsupported at edge of bed.     Group Therapeutic Exercise: ( 25 minutes):  Exercises per grid below to improve mobility and strength.  Required minimal visual, verbal and manual cues to promote proper body alignment and promote proper body posture.  Progressed range, repetitions and complexity of movement as indicated.     Date:  10/15/12 Date:  10/19/12 Date:     ACTIVITY/EXERCISE AM PM AM PM AM PM   Ambulation:           Distance  Device  Duration Group exs   Group exs     Seated Heel Raises X 15 B   X 15 B     Seated Toe Raises X 15 B   X 15 B     Seated Long Arc Quads X 15 B   X 15 B     Seated Marching X 15 B   X 15 B     Seated Hip Abduction X 15 B   X 15 B     UE exs with red t-band X 15 B ea   X 15 B ea     B = bilateral; AA = active assistive; A = active; P = passive      Braces/Orthotics/Lines/Etc:   ?? urostomy    Safety:   After treatment position/precautions:  ?? Supine in bed, Bed/Chair-wheels locked, Bed in low position, Call light within reach and RN notified  Progression/Medical Necessity:   ?? Patient is expected to demonstrate progress in balance, coordination and functional technique to decrease assistance required with   and increase independence with all functional mobility.  ?? Patient  demonstrates good rehab potential due to higher previous functional level.  Compliance with Program/Exercises: Will assess as treatment progresses.   Reason for Continuation of Services/Other Comments:  ?? Patient continues to require skilled intervention due to decline in functional mobility.  Recommendations/Intent for next treatment session: Treatment next visit will focus on advancements to more challenging activities and reduction in assistance provided.  Total Treatment Duration:  Time In: 1415  Time Out: 1510  DAWN H BRACKEN, PTA

## 2012-10-19 NOTE — Wound Image (Signed)
Talked with pt about urostomy care.  Pt practiced measuring and drawing off stoma dn cutting out wafer .  Pt practiced steps in changing pouch and wafer on model.  Pt is alert and very interested in learning her care.  Urostomy has good pouch seal and stents intact.

## 2012-10-20 LAB — METABOLIC PANEL, BASIC
Anion gap: 8 mmol/L (ref 7–16)
BUN: 5 MG/DL — ABNORMAL LOW (ref 8–23)
CO2: 28 mmol/L (ref 21–32)
Calcium: 8.1 MG/DL — ABNORMAL LOW (ref 8.3–10.4)
Chloride: 104 mmol/L (ref 98–107)
Creatinine: 0.54 MG/DL — ABNORMAL LOW (ref 0.6–1.0)
GFR est AA: >60 mL/min/{1.73_m2} (ref >60)
GFR est non-AA: >60 mL/min/{1.73_m2} (ref >60)
Glucose: 119 mg/dL — ABNORMAL HIGH (ref 65–100)
Potassium: 3.6 mmol/L (ref 3.5–5.1)
Sodium: 140 mmol/L (ref 136–145)

## 2012-10-20 LAB — HGB & HCT
HCT: 29.8 % — ABNORMAL LOW (ref 35.8–46.3)
HGB: 9.4 g/dL — ABNORMAL LOW (ref 11.7–15.4)

## 2012-10-20 LAB — MAGNESIUM: Magnesium: 1.8 mg/dL (ref 1.8–2.4)

## 2012-10-20 MED ADMIN — sodium chloride (NS) flush 5-10 mL: INTRAVENOUS | @ 19:00:00 | NDC 87701099893

## 2012-10-20 MED ADMIN — dextrose 5% - 0.45% NaCl with KCl 20 mEq/L infusion: INTRAVENOUS | @ 14:00:00 | NDC 00409790209

## 2012-10-20 MED ADMIN — baclofen (LIORESAL) tablet 10 mg: ORAL | @ 02:00:00 | NDC 68084059911

## 2012-10-20 MED ADMIN — levothyroxine (SYNTHROID) tablet 25 mcg: ORAL | @ 13:00:00 | NDC 72865023790

## 2012-10-20 MED ADMIN — HYDROcodone-acetaminophen (NORCO) 7.5-325 mg per tablet 1 Tab: ORAL | @ 05:00:00 | NDC 00406036623

## 2012-10-20 MED ADMIN — heparin (porcine) injection 5,000 Units: SUBCUTANEOUS | @ 15:00:00 | NDC 00069005904

## 2012-10-20 MED ADMIN — sodium chloride (NS) flush 5-10 mL: INTRAVENOUS | @ 03:00:00 | NDC 87701099893

## 2012-10-20 MED ADMIN — baclofen (LIORESAL) tablet 10 mg: ORAL | @ 21:00:00 | NDC 68084059911

## 2012-10-20 MED ADMIN — cefOXitin (MEFOXIN) 1 g in 0.9% sodium chloride (MBP/ADV) 50 mL MBP: INTRAVENOUS | @ 02:00:00 | NDC 60505075905

## 2012-10-20 MED ADMIN — gabapentin (NEURONTIN) capsule 300 mg: ORAL | @ 21:00:00 | NDC 68084056311

## 2012-10-20 MED ADMIN — baclofen (LIORESAL) tablet 10 mg: ORAL | @ 15:00:00 | NDC 68084059911

## 2012-10-20 MED ADMIN — venlafaxine-SR (EFFEXOR-XR) capsule 150 mg: ORAL | @ 11:00:00 | NDC 68084048611

## 2012-10-20 MED ADMIN — heparin (porcine) injection 5,000 Units: SUBCUTANEOUS | @ 03:00:00 | NDC 00069005904

## 2012-10-20 MED ADMIN — montelukast (SINGULAIR) tablet 10 mg: ORAL | @ 02:00:00 | NDC 68084062011

## 2012-10-20 MED ADMIN — gabapentin (NEURONTIN) capsule 300 mg: ORAL | @ 15:00:00 | NDC 68084056311

## 2012-10-20 MED ADMIN — HYDROcodone-acetaminophen (NORCO) 7.5-325 mg per tablet 1 Tab: ORAL | @ 21:00:00 | NDC 00406036623

## 2012-10-20 MED ADMIN — levothyroxine (SYNTHROID) tablet 25 mcg: ORAL | @ 11:00:00 | NDC 00074455211

## 2012-10-20 MED ADMIN — venlafaxine-SR (EFFEXOR-XR) capsule 150 mg: ORAL | @ 12:00:00 | NDC 82804003830

## 2012-10-20 MED ADMIN — dextrose 5% - 0.45% NaCl with KCl 20 mEq/L infusion: INTRAVENOUS | @ 11:00:00 | NDC 00409790209

## 2012-10-20 MED ADMIN — HYDROcodone-acetaminophen (NORCO) 7.5-325 mg per tablet 1 Tab: ORAL | @ 15:00:00 | NDC 00406036623

## 2012-10-20 MED FILL — HEPARIN (PORCINE) 5,000 UNIT/ML IJ SOLN: 5000 unit/mL | INTRAMUSCULAR | Qty: 1

## 2012-10-20 MED FILL — D5-1/2 NS & POTASSIUM CHLORIDE 20 MEQ/L IV: 20 mEq/L | INTRAVENOUS | Qty: 1000

## 2012-10-20 MED FILL — HYDROCODONE-ACETAMINOPHEN 7.5 MG-325 MG TAB: ORAL | Qty: 1

## 2012-10-20 NOTE — Progress Notes (Signed)
Per night shift RN, pt passed flatus at midnight.

## 2012-10-20 NOTE — Progress Notes (Addendum)
10-20-12  Brief meeting with the pt in order to update d/c needs and concerns.  Pt does not verbalize a d/c concern and she is still expecting to return home with the support of her husband.  Rolling walker was ordered through [employer] on 10-19-12.  Spoke with nursing pt in need of home health care at d/c.  Educated the pt on home health care, [facility] was selected.  Will refer to [facility] once d/c, tentative d/c date is 10-21-12.  Roseanne Reno, BSW

## 2012-10-20 NOTE — Progress Notes (Signed)
Spoke with Dr. Rozann Lesches.  Pt to have home health at discharge.  Roseanne Reno, SW, aware and in to see pt.

## 2012-10-20 NOTE — Wound Image (Signed)
Pouch seal is good and urine is light yellow in color.  Daughter will be back i town by tomorrow and will be available to demonstrate pouch change.

## 2012-10-20 NOTE — Progress Notes (Signed)
Problem: Mobility Impaired (Adult and Pediatric)  Goal: *Acute Goals and Plan of Care (Insert Text)  LTG:  (1.)Cathy Jackson will move from supine to sit and sit to supine via logrolling to side in flat bed without siderails with independence within 7 day(s).   (2.)Cathy Jackson will transfer from bed to chair and chair to bed with independence using the least restrictive/no device within 7 day(s).   (3.)Cathy Jackson will ambulate with supervision/set-up for 300+ feet with the least restrictive/no device within 7 day(s).   PHYSICAL THERAPY: Daily Note, Treatment Day: 4th and PM  INPATIENT: Medicare : Hospital Day: 8    NAME/AGE/GENDER: Cathy Jackson is a 67 y.o. female  DATE: 10/20/2012  PRIMARY DIAGNOSIS: BLADDER CA  BLADDER CA  Procedure(s) (LRB):  ANTERIOR PELVIC EXONTERATION (N/A)  BILATERAL PELVIC LYMPH NODE DISSECTION (Bilateral)  ILEAL CONDUIT URINARY DIVERSION (N/A) 7 Days Post-Op     INTERDISCIPLINARY COLLABORATION: Physical Therapy Assistant, Registered Nurse and Rehabilitation Attendant  ASSESSMENT:   Cathy Jackson presents in supine, agreeable to PT treatment. Pt ambulates in hallway 225 ft with rolling walker and CGA-SBA.  Pt participated in group exs as below and then ambulated back to her room.  She is currently functioning below her baseline as she is very independent and typically helps husband with everything at home, however is ambulating well with walker and balance improving. Pt not feeling as well today however still willing to do therapy.  Will continue with efforts during hospital stay.       ????????This section established at most recent assessment??????????  PROBLEM LIST (Impairments causing functional limitations):  1. Decreased ADL/Functional Activities  2. Decreased Transfer Abilities  3. Decreased Ambulation Ability/Technique  4. Increased Pain affecting function  5. Decreased Activity Tolerance  6. Decreased Knowledge of precautions  REHABILITATION POTENTIAL FOR STATED GOALS: GOOD       PLAN OF CARE:   INTERVENTIONS PLANNED: (Benefits and precautions of physical therapy have been discussed with the patient.)  1. balance exercise  2. bed mobility  3. gait training  4. therapeutic activities  5. therapeutic exercise/strengthening  6. transfer training  7. education  FREQUENCY/DURATION: Follow patient 1-2 times per day/4-7 days per week until goals are met in order to address above goals.    RECOMMENDED REHABILITATION/EQUIPMENT: (at time of discharge pending progress):   Home Health: Physical Therapy; Rolling walker  SUBJECTIVE:   "I'm just not feeling as well today."    Present Symptoms:  Generally not feeling well  Pain Intensity 1: 7  Pain Location 1: Abdomen  Pain Orientation 1: Anterior  Pain Intervention(s) 1: Medication (see MAR)  History of Present Injury/Illness: Admitted for above surgery.                    Prior Level of Function/Home Situation: Lives at home with husband who has CHF and she cares for him, daughter will stay with at discharge to help out.  Patient is normally independent in home and community.   Home Environment: Private residence  # Steps to Enter: 0  One/Two Story Residence: One story  Living Alone: No  Support Systems: Spouse;Child(ren)  Patient Expects to be Discharged to:: Private residence  Current DME Used/Available at Home: None  OBJECTIVE/TREATMENT:   (In addition to Assessment/Re-Assessment sessions the following treatments were rendered)  [employer] AM-PAC??? ???6 Clicks???                                          Basic Mobility Inpatient Short Form  How much difficulty does the patient currently have... Unable A Lot A Little None   1.  Turning over in bed (including adjusting bedclothes, sheets and blankets)?   [ ]  1   [X]  2   [ ]  3   [ ]  4   2.  Sitting down on and standing up from a chair with arms ( e.g., wheelchair, bedside commode, etc.)   [ ]  1   [ ]  2   [X]  3   [ ]  4   3.  Moving from lying on back to sitting on the  side of the bed?   [ ]  1   [X]  2   [ ]  3   [ ]  4               How much help from another person does the patient currently need... Total A Lot A Little None   4.  Moving to and from a bed to a chair (including a wheelchair)?   [ ]  1   [ ]  2   [X]  3   [ ]  4   5.  Need to walk in hospital room?   [ ]  1   [ ]  2   [X]  3   [ ]  4   6.  Climbing 3-5 steps with a railing?   [ ]  1   [ ]  2   [X]  3   [ ]  4   ?? 2007, Trustees of [address], under license to [place], [place]. All rights reserved       Score:  Initial: 16 Most Recent: X (Date: -- )   Interpretation of Tool:  Represents activities that are increasingly more difficult (i.e. Bed mobility, Transfers, Gait).  Score 24 23 22-20 19-15 14-10 9-7 6   Modifier CH CI CJ CK CL CM CN       ?? Mobility - Walking and Moving Around:              [phone] - CURRENT STATUS:        CK - 40%-59% impaired, limited or restricted              [id number] - GOAL STATUS:                CJ - 20%-39% impaired, limited or restricted              U0454 - D/C STATUS:                    ---------------To be determined---------------  Payor: HUMANA MEDICARE  Plan: [employer] HU[employer]NA MEDICARE HMO  Product Type: Managed Care Medicare     Most Recent Physical Functioning:   Gross Assessment:  AROM: Generally decreased, functional (except B shoulder flexion/abduction <90 degrees)  Strength: Generally decreased, functional  Posture:     Balance:  Sitting - Static: Good (unsupported)  Sitting - Dynamic: Good (unsupported)  Standing - Static: Fair (+)  Standing - Dynamic : Fair  Bed Mobility:  Supine to Sit: Modified independence, requires equipment  Wheelchair Mobility:     Transfers:  Sit to Stand: CGA  Stand to Sit: CGA  Interventions: Safety awareness training  Gait:     Base of Support: Widened  Speed/Cadence: Pace decreased (<100 feet/min)  Step Length: Left shortened;Right shortened  Distance (ft): 225 Feet (ft) (x 2)  Assistive Device: Walker, rolling  Ambulation - Level of Assistance: CGA;SBA       Therapeutic Activity: (    15 minutes ):  Therapeutic activities including Bed transfers, Chair transfers and Ambulation on level ground to improve mobility, strength, balance, coordination and activity tolerance.  Required minimal   to promote dynamic balance in standing and to address use of rolling walker for safety.     Group Therapeutic Exercise: ( 20 minutes):  Exercises per grid below to improve mobility and strength.  Required minimal visual, verbal and manual cues to promote proper body alignment and promote proper body posture.  Progressed range, repetitions and complexity of movement as indicated.     Date:  10/15/12 Date:  10/19/12 Date:  10/20/12   ACTIVITY/EXERCISE AM PM AM PM AM PM   Ambulation:           Distance  Device  Duration Group exs   Group exs  Group exs   Seated Heel Raises X 15 B   X 15 B  X 15 B   Seated Toe Raises X 15 B   X 15 B  X 15 B   Seated Long Arc Quads X 15 B   X 15 B  X 15 B   Seated Marching X 15 B   X 15 B  X 15 B   Seated Hip Abduction X 15 B   X 15 B  X 15 B   UE exs with red t-band X 15 B ea   X 15 B ea  X 15 B ea   B = bilateral; AA = active assistive; A = active; P = passive      Braces/Orthotics/Lines/Etc:   ?? urostomy    Safety:   After treatment position/precautions:  ?? Up in chair, Bed/Chair-wheels locked, Bed in low position, Call light within reach and RN notified  Progression/Medical Necessity:   ?? Patient is expected to demonstrate progress in balance, coordination and functional technique to decrease assistance required with   and increase independence with all functional mobility.  ?? Patient demonstrates good rehab potential due to higher previous functional level.  Compliance with Program/Exercises: Will assess as treatment progresses.   Reason for Continuation of Services/Other Comments:  ?? Patient continues to require skilled intervention due to decline in functional mobility.  Recommendations/Intent for next treatment session: Treatment next visit will focus  on advancements to more challenging activities and reduction in assistance provided.  Total Treatment Duration:  Time In: 1400  Time Out: 1440  DAWN H BRACKEN, PTA

## 2012-10-20 NOTE — Progress Notes (Signed)
END OF SHIFT NOTE:    INTAKE/OUTPUT  02/25 0700 - 02/26 0659  In: 1264 [I.V.:1264]  Out: 2200 [Urine:2200]  Voiding: NO  Catheter: YES  Color: clear  Drain:   Urinary Ostomy 10/13/12 Right ;Mid Abdomen (Active)   Drainage Color Serous 10/19/2012  7:15 PM   Site Assessment Clean, dry, & intact 10/19/2012  7:15 PM   Dressing Status Clean, dry, & intact 10/19/2012  7:15 PM   Treatment Other (Comment) 10/15/2012  2:12 PM   Urine Output (mL) 1000 ml 10/20/2012  5:46 AM               DIET  clear    Flatus: Patient does have flatus present.    Stool:  0 occurrences.    Characteristics:       Ambulating  YES    Emesis: 0 occurrences.    Characteristics:          VITAL SIGNS  Patient Vitals for the past 12 hrs:   Temp Pulse Resp BP SpO2   10/20/12 0728 98.7 ??F (37.1 ??C) 94 18 116/59 mmHg 95 %   10/20/12 0430 98.6 ??F (37 ??C) 89 18 151/79 mmHg 99 %   10/20/12 0008 98.3 ??F (36.8 ??C) 97 18 134/70 mmHg 96 %   10/19/12 2107 99.6 ??F (37.6 ??C) 95 18 129/73 mmHg 98 %       Pain Assessment  Pain Intensity 1: 0 (10/20/12 0038)  Pain Location 1: Abdomen  Pain Intervention(s) 1: Medication (see MAR)  Patient Stated Pain Goal: 0            Luna Kitchens, RN

## 2012-10-20 NOTE — Progress Notes (Signed)
Doing well.  Passing flatus.  No complaints  AVSS  Abd soft, NT, ND.  Inc c/d/i.  Urostomy pink and viable.  UOP stable.  Labs reviewed.  Path reviewed.  S/P Ant pelvic exonteration POD#7  Start clears.  Cont ostomy teaching.  Home soon.

## 2012-10-21 MED ADMIN — venlafaxine-SR (EFFEXOR-XR) capsule 150 mg: ORAL | @ 12:00:00 | NDC 82804003830

## 2012-10-21 MED ADMIN — baclofen (LIORESAL) tablet 10 mg: ORAL | @ 02:00:00 | NDC 68084059911

## 2012-10-21 MED ADMIN — dextrose 5% - 0.45% NaCl with KCl 20 mEq/L infusion: INTRAVENOUS | @ 15:00:00 | NDC 00409790209

## 2012-10-21 MED ADMIN — montelukast (SINGULAIR) tablet 10 mg: ORAL | @ 02:00:00 | NDC 68084062011

## 2012-10-21 MED ADMIN — HYDROcodone-acetaminophen (NORCO) 7.5-325 mg per tablet 1 Tab: ORAL | @ 23:00:00 | NDC 68084060111

## 2012-10-21 MED ADMIN — gabapentin (NEURONTIN) capsule 300 mg: ORAL | @ 13:00:00 | NDC 68084056311

## 2012-10-21 MED ADMIN — ondansetron (ZOFRAN) injection 4 mg: INTRAVENOUS | @ 13:00:00 | NDC 00409475503

## 2012-10-21 MED ADMIN — sodium chloride (NS) flush 5-10 mL: INTRAVENOUS | @ 10:00:00 | NDC 87701099893

## 2012-10-21 MED ADMIN — baclofen (LIORESAL) tablet 10 mg: ORAL | @ 13:00:00 | NDC 68084059911

## 2012-10-21 MED ADMIN — levothyroxine (SYNTHROID) tablet 25 mcg: ORAL | @ 12:00:00 | NDC 72865023790

## 2012-10-21 MED ADMIN — levothyroxine (SYNTHROID) tablet 25 mcg: ORAL | @ 10:00:00 | NDC 00074455211

## 2012-10-21 MED ADMIN — heparin (porcine) injection 5,000 Units: SUBCUTANEOUS | @ 02:00:00 | NDC 00069005904

## 2012-10-21 MED ADMIN — sodium chloride (NS) flush 5-10 mL: INTRAVENOUS | @ 03:00:00 | NDC 87701099893

## 2012-10-21 MED ADMIN — gabapentin (NEURONTIN) capsule 300 mg: ORAL | @ 23:00:00 | NDC 68084056311

## 2012-10-21 MED ADMIN — sodium chloride (NS) flush 5-10 mL: INTRAVENOUS | @ 19:00:00 | NDC 87701099893

## 2012-10-21 MED ADMIN — heparin (porcine) injection 5,000 Units: SUBCUTANEOUS | @ 13:00:00 | NDC 00069005904

## 2012-10-21 MED ADMIN — venlafaxine-SR (EFFEXOR-XR) capsule 150 mg: ORAL | @ 10:00:00 | NDC 68084048611

## 2012-10-21 MED ADMIN — HYDROcodone-acetaminophen (NORCO) 7.5-325 mg per tablet 1 Tab: ORAL | @ 02:00:00 | NDC 00406036623

## 2012-10-21 MED ADMIN — diphenhydrAMINE (BENADRYL) capsule 25 mg: ORAL | @ 19:00:00 | NDC 00904530661

## 2012-10-21 MED ADMIN — dextrose 5% - 0.45% NaCl with KCl 20 mEq/L infusion: INTRAVENOUS | @ 03:00:00 | NDC 00409790209

## 2012-10-21 MED ADMIN — baclofen (LIORESAL) tablet 10 mg: ORAL | @ 23:00:00 | NDC 68084059911

## 2012-10-21 MED ADMIN — HYDROcodone-acetaminophen (NORCO) 7.5-325 mg per tablet 1 Tab: ORAL | @ 13:00:00 | NDC 00406036623

## 2012-10-21 MED FILL — HEPARIN (PORCINE) 5,000 UNIT/ML IJ SOLN: 5000 unit/mL | INTRAMUSCULAR | Qty: 1

## 2012-10-21 MED FILL — DIPHENHYDRAMINE 25 MG CAP: 25 mg | ORAL | Qty: 1

## 2012-10-21 MED FILL — HYDROCODONE-ACETAMINOPHEN 7.5 MG-325 MG TAB: ORAL | Qty: 1

## 2012-10-21 MED FILL — D5-1/2 NS & POTASSIUM CHLORIDE 20 MEQ/L IV: 20 mEq/L | INTRAVENOUS | Qty: 1000

## 2012-10-21 NOTE — Progress Notes (Signed)
END OF SHIFT NOTE:    INTAKE/OUTPUT  02/26 0700 - 02/27 0659  In: 2061 [P.O.:360; I.V.:1701]  Out: 1050 [Urine:1050]  Voiding: NO  Catheter: YES  Color: clear  Drain:   Urinary Ostomy 10/13/12 Right ;Mid Abdomen (Active)   Drainage Color Yellow 10/21/2012 12:08 AM   Site Assessment Clean, dry, & intact 10/21/2012 12:08 AM   Dressing Status Clean, dry, & intact 10/21/2012 12:08 AM   Treatment Other (Comment) 10/15/2012  2:12 PM   Urine Output (mL) 300 ml 10/20/2012  8:32 PM               DIET  full liquid    Flatus: Patient does have flatus present.    Stool:  0 occurrences.    Characteristics:       Ambulating  YES    Emesis: 0 occurrences.    Characteristics:          VITAL SIGNS  Patient Vitals for the past 12 hrs:   Temp Pulse Resp BP SpO2   10/21/12 0009 99.1 ??F (37.3 ??C) 96 18 116/71 mmHg 98 %   10/20/12 2021 99 ??F (37.2 ??C) 93 18 123/66 mmHg 98 %   10/20/12 1500 98.1 ??F (36.7 ??C) 87 18 137/82 mmHg 99 %       Pain Assessment  Pain Intensity 1: 0 (10/21/12 0009)  Pain Location 1: Abdomen  Pain Intervention(s) 1: Medication (see MAR)  Patient Stated Pain Goal: 0      -Pt has had an uneventful night. Pt's abd incision OTA is c/d/i. Needs met at this time.       Natasha Bence, RN

## 2012-10-21 NOTE — Progress Notes (Signed)
Pt c/o slight intermittent nausea throughout the day; pt not requesting nausea medicine, pt states, "the nausea is very mild."

## 2012-10-21 NOTE — Progress Notes (Signed)
Problem: Mobility Impaired (Adult and Pediatric)  Goal: *Acute Goals and Plan of Care (Insert Text)  LTG:  (1.)Cathy Jackson will move from supine to sit and sit to supine via logrolling to side in flat bed without siderails with independence within 7 day(s).   (2.)Cathy Jackson will transfer from bed to chair and chair to bed with independence using the least restrictive/no device within 7 day(s).   (3.)Cathy Jackson will ambulate with supervision/set-up for 300+ feet with the least restrictive/no device within 7 day(s).   PHYSICAL THERAPY: Daily Note, Treatment Day: 5th and PM  INPATIENT: Medicare : Hospital Day: 9    NAME/AGE/GENDER: Cathy Jackson is a 67 y.o. female  DATE: 10/21/2012  PRIMARY DIAGNOSIS: BLADDER CA  BLADDER CA  Procedure(s) (LRB):  ANTERIOR PELVIC EXONTERATION (N/A)  BILATERAL PELVIC LYMPH NODE DISSECTION (Bilateral)  ILEAL CONDUIT URINARY DIVERSION (N/A) 8 Days Post-Op     INTERDISCIPLINARY COLLABORATION: Physical Therapy Assistant, Registered Nurse and Certified Nursing Assistant/Patient Care Technician  ASSESSMENT:   Cathy Jackson presents sitting up in recliner.  Pt reports feeling nauseas today and declined ambulating in the hall.  Pt states she just needs to get back to bed.  Pt ambulated over to the bed and returned supine.  Will continue with efforts during hospital stay.       ????????This section established at most recent assessment??????????  PROBLEM LIST (Impairments causing functional limitations):  1. Decreased ADL/Functional Activities  2. Decreased Transfer Abilities  3. Decreased Ambulation Ability/Technique  4. Increased Pain affecting function  5. Decreased Activity Tolerance  6. Decreased Knowledge of precautions  REHABILITATION POTENTIAL FOR STATED GOALS: GOOD      PLAN OF CARE:   INTERVENTIONS PLANNED: (Benefits and precautions of physical therapy have been discussed with the patient.)  1. balance exercise  2. bed mobility  3. gait training  4. therapeutic activities  5.  therapeutic exercise/strengthening  6. transfer training  7. education  FREQUENCY/DURATION: Follow patient 1-2 times per day/4-7 days per week until goals are met in order to address above goals.    RECOMMENDED REHABILITATION/EQUIPMENT: (at time of discharge pending progress):   Home Health: Physical Therapy; Rolling walker  SUBJECTIVE:   "I'm just not feeling good, I've been nauseated most of the day."    Present Symptoms:  Generally not feeling well  Pain Intensity 1: 8  Pain Location 1: Abdomen  Pain Orientation 1: Anterior  Pain Intervention(s) 1: Medication (see MAR)  History of Present Injury/Illness: Admitted for above surgery.                    Prior Level of Function/Home Situation: Lives at home with husband who has CHF and she cares for him, daughter will stay with at discharge to help out.  Patient is normally independent in home and community.   Home Environment: Private residence  # Steps to Enter: 0  One/Two Story Residence: One story  Living Alone: No  Support Systems: Spouse;Child(ren)  Patient Expects to be Discharged to:: Private residence  Current DME Used/Available at Home: None  OBJECTIVE/TREATMENT:   (In addition to Assessment/Re-Assessment sessions the following treatments were rendered)                                      [facility]??? ???6 Clicks???  Basic Mobility Inpatient Short Form  How much difficulty does the patient currently have... Unable A Lot A Little None   1.  Turning over in bed (including adjusting bedclothes, sheets and blankets)?   [ ]  1   [X]  2   [ ]  3   [ ]  4   2.  Sitting down on and standing up from a chair with arms ( e.g., wheelchair, bedside commode, etc.)   [ ]  1   [ ]  2   [X]  3   [ ]  4   3.  Moving from lying on back to sitting on the side of the bed?   [ ]  1   [X]  2   [ ]  3   [ ]  4               How much help from another person does the patient currently need... Total A Lot A Little None   4.  Moving to and from a  bed to a chair (including a wheelchair)?   [ ]  1   [ ]  2   [X]  3   [ ]  4   5.  Need to walk in hospital room?   [ ]  1   [ ]  2   [X]  3   [ ]  4   6.  Climbing 3-5 steps with a railing?   [ ]  1   [ ]  2   [X]  3   [ ]  4   ?? 2007, Trustees of [address], under license to [place], [place]. All rights reserved       Score:  Initial: 16 Most Recent: X (Date: -- )   Interpretation of Tool:  Represents activities that are increasingly more difficult (i.e. Bed mobility, Transfers, Gait).  Score 24 23 22-20 19-15 14-10 9-7 6   Modifier CH CI CJ CK CL CM CN       ?? Mobility - Walking and Moving Around:              [id number] - CURRENT STATUS:        CK - 40%-59% impaired, limited or restricted              [id number] - GOAL STATUS:                CJ - 20%-39% impaired, limited or restricted              [id number] - D/C STATUS:                    ---------------To be determined---------------  Payor: HU[employer]NA MEDICARE  Plan: [employer]SI HU[employer] MEDICARE HMO  Product Type: Managed Care Medicare     Most Recent Physical Functioning:   Gross Assessment:  AROM: Generally decreased, functional (except B shoulder flexion/abduction <90 degrees)  Strength: Generally decreased, functional  Posture:     Balance:  Sitting - Static: Good (unsupported)  Sitting - Dynamic: Good (unsupported)  Standing - Static: Fair (+)  Standing - Dynamic : Fair (to fair+)  Bed Mobility:  Sit to Supine: Supervision  Wheelchair Mobility:     Transfers:  Sit to Stand: CGA  Stand to Sit: CGA;Supervision  Gait:     Base of Support: Widened  Speed/Cadence: Pace decreased (<100 feet/min)  Step Length: Left shortened;Right shortened  Distance (ft): 6 Feet (ft)  Assistive Device:  (HHA)  Ambulation - Level of Assistance: CGA      Therapeutic Activity: (    10  minutes ):  Therapeutic activities including Bed transfers, Chair transfers and Ambulation on level ground to improve mobility, strength, balance, coordination and activity tolerance.  Required minimal   to promote dynamic balance  in standing and to address use of rolling walker for safety.     Group Therapeutic Exercise: ( ):  Exercises per grid below to improve mobility and strength.  Required minimal visual, verbal and manual cues to promote proper body alignment and promote proper body posture.  Progressed range, repetitions and complexity of movement as indicated.     Date:  10/15/12 Date:  10/19/12 Date:  10/20/12   ACTIVITY/EXERCISE AM PM AM PM AM PM   Ambulation:           Distance  Device  Duration Group exs   Group exs  Group exs   Seated Heel Raises X 15 B   X 15 B  X 15 B   Seated Toe Raises X 15 B   X 15 B  X 15 B   Seated Long Arc Quads X 15 B   X 15 B  X 15 B   Seated Marching X 15 B   X 15 B  X 15 B   Seated Hip Abduction X 15 B   X 15 B  X 15 B   UE exs with red t-band X 15 B ea   X 15 B ea  X 15 B ea   B = bilateral; AA = active assistive; A = active; P = passive      Braces/Orthotics/Lines/Etc:   ?? urostomy    Safety:   After treatment position/precautions:  ?? Supine in bed, Bed/Chair-wheels locked, Bed in low position, Call light within reach and RN notified  Progression/Medical Necessity:   ?? Patient is expected to demonstrate progress in balance, coordination and functional technique to decrease assistance required with   and increase independence with all functional mobility.  ?? Patient demonstrates good rehab potential due to higher previous functional level.  Compliance with Program/Exercises: Will assess as treatment progresses.   Reason for Continuation of Services/Other Comments:  ?? Patient continues to require skilled intervention due to decline in functional mobility.  Recommendations/Intent for next treatment session: Treatment next visit will focus on advancements to more challenging activities and reduction in assistance provided.  Total Treatment Duration:  Time In: 1452  Time Out: 1500  DAWN H BRACKEN, PTA

## 2012-10-21 NOTE — Wound Image (Signed)
Patient participated in pouching lesson today, daughter Trudie Buckler was to be here at 2 for lesson but did not show. Patient anxious saying she is supposed to go home tomorrow but she would like to wait until the weekend. Patient not independent with ostomy cares yet. Will need home health to follow up at home. Supplies in room. Talked about supplies and where to purchase. Discussed sample programs/starter kits and she is in agreement to have them sent to her home. Ostomy nurse to follow up tomorrow.

## 2012-10-21 NOTE — Progress Notes (Signed)
Mild nausea this am without emesis.  Cont flatus.  AVSS  UOP stable and clear  No new labs  Abd soft, NT, ND.  Urostomy pink and viable.  Stents in place.  Mild erythema at superior portion of wound.  No drainage.  Path;  T1G3N0 neg margins.  S/P Anterior pelvic exonteration POD#8  Soft diet.  Home once nausea clears.

## 2012-10-22 MED ADMIN — venlafaxine-SR (EFFEXOR-XR) capsule 150 mg: ORAL | @ 13:00:00 | NDC 68084048611

## 2012-10-22 MED ADMIN — gabapentin (NEURONTIN) capsule 300 mg: ORAL | @ 13:00:00 | NDC 68084056311

## 2012-10-22 MED ADMIN — diphenhydrAMINE (BENADRYL) capsule 25 mg: ORAL | @ 13:00:00 | NDC 00904530661

## 2012-10-22 MED ADMIN — montelukast (SINGULAIR) tablet 10 mg: ORAL | @ 02:00:00 | NDC 68084062011

## 2012-10-22 MED ADMIN — sodium chloride (NS) flush 5-10 mL: INTRAVENOUS | @ 10:00:00 | NDC 87701099893

## 2012-10-22 MED ADMIN — ondansetron (ZOFRAN) injection 4 mg: INTRAVENOUS | @ 13:00:00 | NDC 00409475503

## 2012-10-22 MED ADMIN — dextrose 5% - 0.45% NaCl with KCl 20 mEq/L infusion: INTRAVENOUS | @ 17:00:00 | NDC 00409790209

## 2012-10-22 MED ADMIN — heparin (porcine) injection 5,000 Units: SUBCUTANEOUS | @ 02:00:00 | NDC 00069005904

## 2012-10-22 MED ADMIN — dextrose 5% - 0.45% NaCl with KCl 20 mEq/L infusion: INTRAVENOUS | @ 02:00:00 | NDC 00409790209

## 2012-10-22 MED ADMIN — venlafaxine-SR (EFFEXOR-XR) capsule 150 mg: ORAL | @ 10:00:00 | NDC 68084048611

## 2012-10-22 MED ADMIN — heparin (porcine) injection 5,000 Units: SUBCUTANEOUS | @ 13:00:00 | NDC 00069005904

## 2012-10-22 MED ADMIN — gabapentin (NEURONTIN) capsule 300 mg: ORAL | @ 23:00:00 | NDC 76420004790

## 2012-10-22 MED ADMIN — levothyroxine (SYNTHROID) tablet 25 mcg: ORAL | @ 12:00:00 | NDC 72865023790

## 2012-10-22 MED ADMIN — sodium chloride (NS) flush 5-10 mL: INTRAVENOUS | @ 02:00:00 | NDC 87701099893

## 2012-10-22 MED ADMIN — venlafaxine-SR (EFFEXOR-XR) capsule 150 mg: ORAL | @ 12:00:00 | NDC 82804003830

## 2012-10-22 MED ADMIN — sodium chloride (NS) flush 5-10 mL: INTRAVENOUS | @ 19:00:00 | NDC 87701099893

## 2012-10-22 MED ADMIN — levothyroxine (SYNTHROID) tablet 25 mcg: ORAL | @ 10:00:00 | NDC 00074455211

## 2012-10-22 MED ADMIN — gabapentin (NEURONTIN) capsule 300 mg: ORAL | @ 22:00:00 | NDC 68084056311

## 2012-10-22 MED FILL — DIPHENHYDRAMINE 25 MG CAP: 25 mg | ORAL | Qty: 1

## 2012-10-22 MED FILL — HEPARIN (PORCINE) 5,000 UNIT/ML IJ SOLN: 5000 unit/mL | INTRAMUSCULAR | Qty: 1

## 2012-10-22 MED FILL — D5-1/2 NS & POTASSIUM CHLORIDE 20 MEQ/L IV: 20 mEq/L | INTRAVENOUS | Qty: 1000

## 2012-10-22 NOTE — Progress Notes (Signed)
Problem: Nutrition Deficit  Goal: *Optimize nutritional status  Nutrition F/U  Assessment:   ?? Improving but continued inadequate protein-energy intake. Clear liquid diet started 2-26 and progressed to full liquid that same day and then to GI soft on 2-27. Meal intake per CNA notes 40-50% today. Pt reports she ate " a coupe" of pieces of french toast and sausage this morning. Pt consumed < 25% of lunch per RD meal round. Pt denies a any nausea. She is willing to drink shake after lunch today but declines routine shake as she plans on going home tomorrow. Pt says her appetite is picking up and almost back to her usual. Her daughter says that she just picks at eating all day long at home.  ?? BMP Glucose: 119 mg/dl, no noted hx of DM  Intervention:   Goal(s):  Meal intake > 75%   Plan:   1. Modify GI soft diet per preferences and tolerances.  2. Provided pt with Ensure shake after lunch today  3. Suggest weekly weights  4. Consider check Hga1C  Monitoring/Evaluation:   Meal intake  Harlow Mares, RD, LD, CNSC [phone]

## 2012-10-22 NOTE — Progress Notes (Signed)
Urology Progress Note    Subjective:     Daily Progress Note: 10/22/2012 12:28 PM    Cathy Jackson is doing good. She reports pain is well controlled. . Still has mild nausea. Now on regular diet.  Objective:     BP 138/64   Pulse 93   Temp(Src) 98.4 ??F (36.9 ??C)   Resp 17   Ht 5\' 2"  (1.575 m)   Wt 166 lb 14.2 oz (75.7 kg)   BMI 30.52 kg/m2   SpO2 94%     Temp (24hrs), Avg:98.5 ??F (36.9 ??C), Min:98.1 ??F (36.7 ??C), Max:98.8 ??F (37.1 ??C)      Intake and Output:  02/26 1900 - 02/28 0659  In: 3356 [P.O.:480; I.V.:2876]  Out: 2350 [Urine:2350]       Physical Exam: Heart: regular rate and rhythm, S1, S2 normal, no murmur, click, rub or gallopLungs: clear to auscultation bilaterallyAbdomen:Soft, non-tender, active bowel sounds,Bladder is not palpable. Stoma pink.  Incision: well approximated. There is some mild erythema along the upper portion of the wound. I think this is more due to staples than a wound infection.    Lab/Data Review:  noted    Assessment/Plan:     Active Problems:    * No active hospital problems. *      Plan:  Continue IV fluid. Hopefully home tomorrow.    Signed By: Rudean Hitt, MD                         October 22, 2012

## 2012-10-22 NOTE — Progress Notes (Signed)
Complaints of pain from foley tubing. allevyn placed between tubing and patient.

## 2012-10-22 NOTE — Progress Notes (Addendum)
END OF SHIFT NOTE:    INTAKE/OUTPUT  02/27 0700 - 02/28 0659  In: 1378 [P.O.:360; I.V.:1018]  Out: 750 [Urine:750]  Voiding: NO  Catheter: YES (urostomy)  Color: clear  Drain:   Urinary Ostomy 10/13/12 Right ;Mid Abdomen (Active)   Drainage Color Yellow 10/22/2012 12:05 AM   Site Assessment Clean, dry, & intact 10/22/2012 12:05 AM   Dressing Status Clean, dry, & intact 10/22/2012 12:05 AM   Treatment Other (Comment) 10/15/2012  2:12 PM   Urine Output (mL) 750 ml 10/21/2012  2:37 PM               DIET  soft    Flatus: Patient does have flatus present.    Stool:  0 occurrences.    Characteristics:       Ambulating  YES    Emesis: 0 occurrences.    Characteristics:          VITAL SIGNS  Patient Vitals for the past 12 hrs:   Temp Pulse Resp BP SpO2   10/21/12 2333 98.8 ??F (37.1 ??C) 99 16 147/74 mmHg 94 %   10/21/12 1932 98.6 ??F (37 ??C) 101 18 132/71 mmHg 96 %   10/21/12 1504 98.1 ??F (36.7 ??C) 97 18 120/60 mmHg 98 %       Pain Assessment  Pain Intensity 1: 0 (10/22/12 0005)  Pain Location 1: Abdomen  Pain Intervention(s) 1: Medication (see MAR)  Patient Stated Pain Goal: 0    -Pt has stated that she has felt better tonight with her nausea. She states "I barely feel it at this time". Pt has not had any vomiting. Pt's abd incision OTA c/d/i. Needs met at this time.        Natasha Bence, RN

## 2012-10-22 NOTE — Progress Notes (Signed)
10-22-12  Meeting with the family and pt, updating d/c needs and concerns.  D/C plans remain to return home with the support of her spouse.  Rolling walker ordered several days ago and delivered.  Will arrange home health through [facility] at d/c.  Roseanne Reno, BSW

## 2012-10-22 NOTE — Wound Image (Signed)
Daughter changed urostomy pouch with some assistance.  Has some difficullty cutting wafer .  Pt can do this .  Pt has written instructions and information on supplies needed and where to obtain supplies.  Pt has good understanding of her care and daughter seems ot hve ability to assist her mother.  Pt wants to become independent in her care.

## 2012-10-22 NOTE — Progress Notes (Signed)
Problem: Mobility Impaired (Adult and Pediatric)  Goal: *Acute Goals and Plan of Care (Insert Text)  LTG:  (1.)Cathy Jackson will move from supine to sit and sit to supine via logrolling to side in flat bed without siderails with independence within 7 day(s).   (2.)Cathy Jackson will transfer from bed to chair and chair to bed with independence using the least restrictive/no device within 7 day(s).   (3.)Cathy Jackson will ambulate with supervision/set-up for 300+ feet with the least restrictive/no device within 7 day(s).   PHYSICAL THERAPY: Daily Note, Treatment Day: 6th and PM  INPATIENT: Medicare : Hospital Day: 10    NAME/AGE/GENDER: Cathy Jackson is a 67 y.o. female  DATE: 10/22/2012  PRIMARY DIAGNOSIS: BLADDER CA  BLADDER CA  Procedure(s) (LRB):  ANTERIOR PELVIC EXONTERATION (N/A)  BILATERAL PELVIC LYMPH NODE DISSECTION (Bilateral)  ILEAL CONDUIT URINARY DIVERSION (N/A) 9 Days Post-Op     INTERDISCIPLINARY COLLABORATION: Physical Therapy Assistant, Registered Nurse and Certified Nursing Assistant/Patient Care Technician  ASSESSMENT:   Cathy Jackson presents supine in bed on contact.  Pt performed bed mobility and then ambulated in hall to therapy gym.  Pt participated in group exs as below and then ambulated back to her room.  Pt is making good progress.  Will continue with efforts during hospital stay.       ????????This section established at most recent assessment??????????  PROBLEM LIST (Impairments causing functional limitations):  1. Decreased ADL/Functional Activities  2. Decreased Transfer Abilities  3. Decreased Ambulation Ability/Technique  4. Increased Pain affecting function  5. Decreased Activity Tolerance  6. Decreased Knowledge of precautions  REHABILITATION POTENTIAL FOR STATED GOALS: GOOD      PLAN OF CARE:   INTERVENTIONS PLANNED: (Benefits and precautions of physical therapy have been discussed with the patient.)  1. balance exercise  2. bed mobility  3. gait training  4. therapeutic  activities  5. therapeutic exercise/strengthening  6. transfer training  7. education  FREQUENCY/DURATION: Follow patient 1-2 times per day/4-7 days per week until goals are met in order to address above goals.    RECOMMENDED REHABILITATION/EQUIPMENT: (at time of discharge pending progress):   Home Health: Physical Therapy; Rolling walker  SUBJECTIVE:   "I'll give it a try."    Present Symptoms:  Generally not feeling well  Pain Intensity 1: 0  Pain Location 1: Abdomen  Pain Orientation 1: Anterior  Pain Intervention(s) 1: Medication (see MAR)  History of Present Injury/Illness: Admitted for above surgery.                    Prior Level of Function/Home Situation: Lives at home with husband who has CHF and she cares for him, daughter will stay with at discharge to help out.  Patient is normally independent in home and community.   Home Environment: Private residence  # Steps to Enter: 0  One/Two Story Residence: One story  Living Alone: No  Support Systems: Spouse;Child(ren)  Patient Expects to be Discharged to:: Private residence  Current DME Used/Available at Home: None  OBJECTIVE/TREATMENT:   (In addition to Assessment/Re-Assessment sessions the following treatments were rendered)                                      [facility]??? ???6 Clicks???  Basic Mobility Inpatient Short Form  How much difficulty does the patient currently have... Unable A Lot A Little None   1.  Turning over in bed (including adjusting bedclothes, sheets and blankets)?   [ ]  1   [X]  2   [ ]  3   [ ]  4   2.  Sitting down on and standing up from a chair with arms ( e.g., wheelchair, bedside commode, etc.)   [ ]  1   [ ]  2   [X]  3   [ ]  4   3.  Moving from lying on back to sitting on the side of the bed?   [ ]  1   [X]  2   [ ]  3   [ ]  4               How much help from another person does the patient currently need... Total A Lot A Little None   4.  Moving to and from a bed to a chair (including a  wheelchair)?   [ ]  1   [ ]  2   [X]  3   [ ]  4   5.  Need to walk in hospital room?   [ ]  1   [ ]  2   [X]  3   [ ]  4   6.  Climbing 3-5 steps with a railing?   [ ]  1   [ ]  2   [X]  3   [ ]  4   ?? 2007, Trustees of [address], under license to [place], [place]. All rights reserved       Score:  Initial: 16 Most Recent: X (Date: -- )   Interpretation of Tool:  Represents activities that are increasingly more difficult (i.e. Bed mobility, Transfers, Gait).  Score 24 23 22-20 19-15 14-10 9-7 6   Modifier CH CI CJ CK CL CM CN       ?? Mobility - Walking and Moving Around:              [phone] - CURRENT STATUS:        CK - 40%-59% impaired, limited or restricted              [id number] - GOAL STATUS:                CJ - 20%-39% impaired, limited or restricted              U0454 - D/C STATUS:                    ---------------To be determined---------------  Payor: HUMANA MEDICARE  Plan: [employer]SHSI HU[employer]NA MEDICARE HMO  Product Type: Managed Care Medicare     Most Recent Physical Functioning:   Gross Assessment:  AROM: Generally decreased, functional (except B shoulder flexion/abduction <90 degrees)  Strength: Generally decreased, functional  Posture:     Balance:  Sitting - Static: Good (unsupported)  Sitting - Dynamic: Good (unsupported)  Standing - Static: Fair (+)  Standing - Dynamic : Fair (+)  Bed Mobility:  Supine to Sit: Modified independence, requires equipment  Wheelchair Mobility:     Transfers:  Sit to Stand: CGA  Stand to Sit: CGA;Supervision  Interventions: Safety awareness training;Verbal cues  Gait:     Base of Support: Widened  Speed/Cadence: Pace decreased (<100 feet/min)  Step Length: Left shortened;Right shortened  Distance (ft): 250 Feet (ft) (x 2)  Assistive Device: Walker, rolling  Ambulation - Level of Assistance: CGA  Therapeutic Activity: (   24 minutes ):  Therapeutic activities including Bed transfers, Chair transfers and Ambulation on level ground to improve mobility, strength, balance, coordination and  activity tolerance.  Required minimal   to promote dynamic balance in standing and to address use of rolling walker for safety.     Group Therapeutic Exercise: ( 20 minutes):  Exercises per grid below to improve mobility and strength.  Required minimal visual, verbal and manual cues to promote proper body alignment and promote proper body posture.  Progressed range, repetitions and complexity of movement as indicated.     Date:  10/15/12 Date:  10/19/12 Date:  10/20/12 Date:  10/22/12   ACTIVITY/EXERCISE AM PM AM PM AM PM AM   Ambulation:           Distance  Device  Duration Group exs   Group exs  Group exs Group exs with 1# ankle wt   Seated Heel Raises X 15 B   X 15 B  X 15 B X 15 B   Seated Toe Raises X 15 B   X 15 B  X 15 B X 15 B   Seated Long Arc Quads X 15 B   X 15 B  X 15 B X 15 B   Seated Marching X 15 B   X 15 B  X 15 B X 15 B   Seated Hip Abduction X 15 B   X 15 B  X 15 B X 15 B   UE exs with red t-band X 15 B ea   X 15 B ea  X 15 B ea X 15 B ea   B = bilateral; AA = active assistive; A = active; P = passive      Braces/Orthotics/Lines/Etc:   ?? urostomy    Safety:   After treatment position/precautions:  ?? Up in chair, Bed/Chair-wheels locked, Call light within reach and RN notified  Progression/Medical Necessity:   ?? Patient is expected to demonstrate progress in balance, coordination and functional technique to decrease assistance required with   and increase independence with all functional mobility.  ?? Patient demonstrates good rehab potential due to higher previous functional level.  Compliance with Program/Exercises: Will assess as treatment progresses.   Reason for Continuation of Services/Other Comments:  ?? Patient continues to require skilled intervention due to decline in functional mobility.  Recommendations/Intent for next treatment session: Treatment next visit will focus on advancements to more challenging activities and reduction in assistance provided.  Total Treatment Duration:  Time In: 1000   Time Out: 1100  DAWN H BRACKEN, PTA

## 2012-10-22 NOTE — Progress Notes (Signed)
illeal conduit draining clear urine to bag and bedside drainage with some slough. Stents in place. Midline staples intact and well approximated. Mild redness around incision site. Refused baclofen this am reports itchng and patient believes this med is responsible for itching. Given benadryl po. zofran 4 mg iv for nausea.

## 2012-10-22 NOTE — Progress Notes (Signed)
Right forearm iv infiltrated. Removed tip intact. Warmer placed on arm for comfort. Vat called for new iv insertion.

## 2012-10-22 NOTE — Progress Notes (Signed)
Per Dr Deloria Lair who is on call for Dr Rozann Lesches at this time, ok to leave iv out as patient will probably discharge in am and taking po fluids well.

## 2012-10-23 MED ADMIN — HYDROcodone-acetaminophen (NORCO) 7.5-325 mg per tablet 1 Tab: ORAL | @ 06:00:00 | NDC 68084060111

## 2012-10-23 MED ADMIN — diphenhydrAMINE (BENADRYL) capsule 25 mg: ORAL | @ 13:00:00 | NDC 00904530661

## 2012-10-23 MED ADMIN — levothyroxine (SYNTHROID) tablet 25 mcg: ORAL | @ 13:00:00 | NDC 00074455211

## 2012-10-23 MED ADMIN — heparin (porcine) injection 5,000 Units: SUBCUTANEOUS | @ 13:00:00 | NDC 00069005904

## 2012-10-23 MED ADMIN — heparin (porcine) injection 5,000 Units: SUBCUTANEOUS | @ 02:00:00 | NDC 00409272301

## 2012-10-23 MED ADMIN — HYDROcodone-acetaminophen (NORCO) 7.5-325 mg per tablet 1 Tab: ORAL | @ 02:00:00 | NDC 68084060111

## 2012-10-23 MED ADMIN — sodium chloride (NS) flush 5-10 mL: INTRAVENOUS | @ 03:00:00 | NDC 87701099893

## 2012-10-23 MED ADMIN — gabapentin (NEURONTIN) capsule 300 mg: ORAL | @ 13:00:00 | NDC 68084056311

## 2012-10-23 MED ADMIN — HYDROmorphone (PF) (DILAUDID) injection 0.5 mg: INTRAVENOUS | @ 06:00:00 | NDC 00409128331

## 2012-10-23 MED ADMIN — sodium chloride (NS) flush 5-10 mL: INTRAVENOUS | @ 19:00:00 | NDC 87701099893

## 2012-10-23 MED ADMIN — HYDROcodone-acetaminophen (NORCO) 7.5-325 mg per tablet 1 Tab: ORAL | @ 16:00:00 | NDC 68084060111

## 2012-10-23 MED ADMIN — montelukast (SINGULAIR) tablet 10 mg: ORAL | @ 03:00:00 | NDC 68084062011

## 2012-10-23 MED ADMIN — sodium chloride (NS) flush 5-10 mL: INTRAVENOUS | @ 11:00:00 | NDC 87701099893

## 2012-10-23 MED ADMIN — baclofen (LIORESAL) tablet 10 mg: ORAL | @ 13:00:00 | NDC 68084059911

## 2012-10-23 MED ADMIN — venlafaxine-SR (EFFEXOR-XR) capsule 150 mg: ORAL | @ 13:00:00 | NDC 68084048611

## 2012-10-23 MED ADMIN — baclofen (LIORESAL) tablet 10 mg: ORAL | @ 03:00:00 | NDC 68084059911

## 2012-10-23 MED FILL — HYDROCODONE-ACETAMINOPHEN 7.5 MG-325 MG TAB: ORAL | Qty: 1

## 2012-10-23 MED FILL — HEPARIN (PORCINE) 5,000 UNIT/ML IJ SOLN: 5000 unit/mL | INTRAMUSCULAR | Qty: 1

## 2012-10-23 MED FILL — HYDROMORPHONE (PF) 1 MG/ML IJ SOLN: 1 mg/mL | INTRAMUSCULAR | Qty: 1

## 2012-10-23 MED FILL — DIPHENHYDRAMINE 25 MG CAP: 25 mg | ORAL | Qty: 1

## 2012-10-23 NOTE — Progress Notes (Signed)
Patient wants to discharge to home. Phoned Dr Deloria Lair. In ER will be up to discharge patient soon. Patient informed.

## 2012-10-23 NOTE — Progress Notes (Signed)
Discussed with patient's daughter how to do dressing change. Sent supplies home with family. Given discharge instructions, med list, prescriptions. No iv at this time. Urostomy.

## 2012-10-23 NOTE — Progress Notes (Signed)
Wet to dry dressing applied as ordered to midline incision. Two 2x2 gauze saline soaked packed into incision covered with abd pad and tape tolerated well.

## 2012-10-23 NOTE — Progress Notes (Signed)
Patient denies pain this am. States a lot of gas and two bowel movements last night with abdominal pain. Staples well approximated to mid line abdominal incision. Scant amount of serosanginous drainage on top portion of incision. Urostomy draining yellow mucus urine to bad and bedside drainage bad. Stents visualized. No nausea today. States she wants to get staples out and go home.

## 2012-10-23 NOTE — Progress Notes (Signed)
The pt is feeling quite comfortable. On exam there is some serosanguinous drainage from the upper portion of her wound and there is mild erythema of the surrounding skin. The rest of the wound looks good. I opened up the superior 2 inches of the wound and drained a small amount of thick sanguinous fluid without frank pus. I removed half of the staples from the rest of the wound. The open portion of the wound will be packed wet to dry. I will keep her until tomorrow so that arrangements for wound care can be made.

## 2012-10-23 NOTE — Progress Notes (Signed)
The pt has decided she wants to go home home. See orders.

## 2012-10-28 NOTE — Progress Notes (Addendum)
[facility]  [address]  [place], [place] [address]  [phone]    Cathy Jackson  DOB: [birth year]     HPI   67 y.o., female returns in follow up for bladder cancer.  S/P anterior pelvic exonteration, B PLND and ileal conduit urinary diversion on 10/13/12.  Final path was TCC T1G3NO with neg margins.  She is doing well post op.  She cont to pack her upper portion of her wound bid which was opened due to erythema.  She is having reg bm's and denies and vaginal drainage.   Denies fevers.  Slowly progressing per her report.      Past Medical History   Diagnosis Date   ??? Arthritis      generalized   ??? Environmental allergies      managed with medication    ??? Hypothyroidism      managed with medication    ??? Depression      managed with medication    ??? Bladder cancer    ??? History of kidney stones      states surgical removed   ??? Urethral caruncle 10/27/2012   ??? Unspecified disorder of bladder 10/27/2012   ??? Dysuria 10/27/2012   ??? Abdominal pain, unspecified site 10/27/2012   ??? Urinary frequency 10/27/2012   ??? Asthma 10/27/2012   ??? GERD (gastroesophageal reflux disease) 10/27/2012   ??? Urinary tract infection, site not specified 10/27/2012   ??? Other "heavy-for-dates" infants      ureterectomy and ileal conduit     Past Surgical History   Procedure Laterality Date   ??? Hx breast biopsy Left 2000     x 2   ??? Hx cesarean section       x 1   ??? Hx tubal ligation     ??? Pr close cystostomy       cystoscopy/ turbt x 2   ??? Hx wrist fracture tx Left    ??? Hx cholecystectomy       Current Outpatient Prescriptions   Medication Sig Dispense Refill   ??? azelastine (ASTELIN) 137 mcg nasal spray 1 Spray two (2) times a day. Use in each nostril as directed       ??? darifenacin (ENABLEX) 7.5 mg ER tablet Take  by mouth daily.       ??? estradiol (ESTRACE) 0.01 % (0.1 mg/gram) vaginal cream Insert 2 g into vagina daily.       ??? omega-3 fatty acids-vitamin e (FISH OIL) 1,000 mg cap Take 1 Cap by mouth.       ??? HYDROcodone-acetaminophen (NORCO) 10-325  mg tablet Take 1 Tab by mouth every four (4) hours as needed for Pain.  30 Tab  0   ??? neomycin (MYCIFRADIN) 500 mg tablet Take 500 mg by mouth. To take today at 12 pm, 6 pm, 11pm       ??? traMADol (ULTRAM) 50 mg tablet Take 50 mg by mouth four (4) times daily.       ??? polyethylene glycol (MIRALAX) 17 gram/dose powder Take 17 g by mouth daily.       ??? nystatin (MYCOSTATIN) topical cream Apply  to affected area two (2) times a day.       ??? levothyroxine (SYNTHROID) 25 mcg tablet Take 25 mcg by mouth Daily (before breakfast). Indications: HYPOTHYROIDISM       ??? lubiPROStone (AMITIZA) 24 mcg capsule Take 24 mcg by mouth two (2) times daily (with meals). Indications: CHRONIC IDIOPATHIC CONSTIPATION       ???  montelukast (SINGULAIR) 10 mg tablet Take 10 mg by mouth nightly.       ??? baclofen (LIORESAL) 10 mg tablet Take 10 mg by mouth three (3) times daily. Indications: unsure why       ??? venlafaxine-SR (EFFEXOR XR) 150 mg capsule Take 150 mg by mouth every morning. Indications: GENERALIZED ANXIETY DISORDER       ??? gabapentin (NEURONTIN) 300 mg tablet Take 300 mg by mouth two (2) times a day.       ??? docusate sodium (COLACE) 100 mg capsule Take 100 mg by mouth two (2) times a day.         No Known Allergies  History     Social History   ??? Marital Status: MARRIED     Spouse Name: N/A     Number of Children: N/A   ??? Years of Education: N/A     Occupational History   ??? Not on file.     Social History Main Topics   ??? Smoking status: Former Smoker -- 0.50 packs/day for 20 years   ??? Smokeless tobacco: Never Used      Comment: stopped at age 26   ??? Alcohol Use: No   ??? Drug Use: No   ??? Sexually Active: Not on file     Other Topics Concern   ??? Not on file     Social History Narrative   ??? No narrative on file     Family History   Problem Relation Age of Onset   ??? Stroke Mother    ??? Heart Disease Mother    ??? Cancer Father    ??? Asthma Father    ??? Lung Disease Father      COPD/smoker   ??? Cancer Brother    ??? Heart Disease Brother    ???  Alcohol abuse Brother        Review of Systems  All systems reviewed and are negative at this time.    Physical Exam  BP 134/81   Pulse 125   Temp(Src) 100.8 ??F (38.2 ??C) (Tympanic)   Ht 5\' 2"  (1.575 m)   Wt 150 lb (68.04 kg)   BMI 27.43 kg/m2  General appearance - alert, well appearing, and in no distress  Mental status - alert, oriented to person, place, and time  Eyes - extraocular eye movements intact, sclera anicteric  Abdomen - soft, nontender, nondistended, no masses or organomegaly  Incision healing well.  Superior edge open with excellent granulation.  No drainage expressed.  No sign of infection.  Urostomy pink and viable.  Stents have migrated out and were removed today in office.  Neurological -  normal speech, no focal findings or movement disorder noted  Skin - normal coloration and turgor          Assessment/Plan  1. Malignant neoplasm of bladder, part unspecified    Doing well.  Will leave remaining staples for now.  CBC and BMP drawn today.   Pt encouraged to use IS and call for temp >101.5.  RTO in one wk.  Dressing changed today.  Will start Cipro empirically due to low grade temp.    Caylin Nass P Nuri Branca, DO    Pt requested rx for pain meds after closing note.  New rx for Norco provided 5/325 #20.

## 2012-10-28 NOTE — Addendum Note (Signed)
Addended by: Pryor Montes on: 10/28/2012 02:31 PM     Modules accepted: Orders

## 2012-10-29 LAB — METABOLIC PANEL, BASIC
BUN/Creatinine ratio: 30 — ABNORMAL HIGH (ref 11–26)
BUN: 17 mg/dL (ref 8–27)
CO2: 24 mmol/L (ref 19–28)
Calcium: 8.6 mg/dL (ref 8.6–10.2)
Chloride: 98 mmol/L (ref 97–108)
Creatinine: 0.57 mg/dL (ref 0.57–1.00)
GFR est AA: 110 mL/min/{1.73_m2} (ref >59)
GFR est non-AA: 96 mL/min/{1.73_m2} (ref >59)
Glucose: 119 mg/dL — ABNORMAL HIGH (ref 65–99)
Potassium: 5 mmol/L (ref 3.5–5.2)
Sodium: 138 mmol/L (ref 134–144)

## 2012-10-29 LAB — CBC WITH AUTOMATED DIFF
ABS. BASOPHILS: 0 10*3/uL (ref 0.0–0.2)
ABS. EOSINOPHILS: 0.2 10*3/uL (ref 0.0–0.4)
ABS. IMM. GRANS.: 0 10*3/uL (ref 0.0–0.1)
ABS. MONOCYTES: 1 10*3/uL — ABNORMAL HIGH (ref 0.1–0.9)
ABS. NEUTROPHILS: 8.2 10*3/uL — ABNORMAL HIGH (ref 1.4–7.0)
Abs Lymphocytes: 1 10*3/uL (ref 0.7–3.1)
BASOPHILS: 0 % (ref 0–3)
EOSINOPHILS: 2 % (ref 0–5)
HCT: 32.1 % — ABNORMAL LOW (ref 34.0–46.6)
HGB: 10.1 g/dL — ABNORMAL LOW (ref 11.1–15.9)
IMMATURE GRANULOCYTES: 0 % (ref 0–2)
Lymphocytes: 10 % — ABNORMAL LOW (ref 14–46)
MCH: 27.2 pg (ref 26.6–33.0)
MCHC: 31.5 g/dL (ref 31.5–35.7)
MCV: 87 fL (ref 79–97)
MONOCYTES: 10 % (ref 4–12)
NEUTROPHILS: 78 % — ABNORMAL HIGH (ref 40–74)
PLATELET: 467 10*3/uL — ABNORMAL HIGH (ref 155–379)
RBC: 3.71 x10E6/uL — ABNORMAL LOW (ref 3.77–5.28)
RDW: 15.2 % (ref 12.3–15.4)
WBC: 10.4 10*3/uL (ref 3.4–10.8)

## 2012-10-29 NOTE — Telephone Encounter (Signed)
i spoke with daughter today who stated that mom is running low grade temp of 101.11F.  She has not yet filled her Cipro given yesterday.  I reviewed her labs with her in detail and asked her to push fluids and take tylenol 500mg  po prn.  If her temp climbs above 101.56F or her condition worsens she was instructed to call as she may need readmission.

## 2012-11-01 NOTE — Telephone Encounter (Signed)
Left message for ostomy nurse in regards to supplies// Cathy Jackson

## 2012-11-01 NOTE — Telephone Encounter (Signed)
The daughter is calling to get a prescription for bags after the surgery.  She states when they were discharged they were given a few but will need a prescription for ostomy supplies sent to a supply store to get this filled.  She does not know where to send this and would like to talk to someone about the process.  Daughter [phone]

## 2012-11-02 NOTE — Telephone Encounter (Signed)
Spoke to [name] at ostomy office, they have been in contact with the family, but the daughter heidi says they have not, I have given heidi the number to st and to ask for linda pyms office//Cathy Jackson

## 2012-11-02 NOTE — Discharge Summary (Signed)
[facility]                            [address]                           [place], [place]. [address]                                [phone]                                DISCHARGE SUMMARY    NAME: Cathy Jackson, Cathy Jackson                         MR:  [id number]  LOC: 603 01             SEX:  F               ACCT:  [id number]  DOB: 12-04-1945             AGE:  66              PT:  I  ADMIT: 10/13/2012           DSCH:  10/23/2012     MSV:        DISCHARGE DIAGNOSES  1. Bladder cancer.  2. Depression.  3. Kidney stones.  4. Urethral caruncle.  5. Asthma.  6. Gastroesophageal reflux disease.  7. History of recurrent urinary tract infections.  8. Acute blood loss anemia.    PROCEDURE: Anterior pelvic exenteration, bilateral pelvic lymph node  dissection and ileal conduit urinary diversion on 10/13/2012.    CLINICAL HISTORY: This is a 67 year old female with a long-standing  history of irritative lower urinary tract symptoms. She has previously  been diagnosed with T1 grade 3 transitional cell carcinoma of the  bladder. This has recurred quite quickly after BCG therapy. Given her  recurrence and history of bladder pain and incontinence, I had  recommended the above-mentioned procedure. All risks, benefits, and  alternatives to the procedure were reviewed, and she is willing to  proceed at this time.    HOSPITAL COURSE: The patient underwent an uneventful anterior pelvic  examination, exenteration, bilateral pelvic lymph node dissection and  ileal conduit urinary diversion. Estimated blood loss was 800 mL. She did  receive 1 unit of packed red blood cells intraoperatively.  Postoperatively, she was admitted to the ICU. She did very well here and  was transferred to the regular floor on postoperative day #1. Her  postoperative course was relatively unremarkable. An epidural did control  her pain nicely and this was removed on postoperative day #3. The JP  creatinine was  checked on postoperative day #3, which was normal. She  began ambulating shortly following surgery on postoperative day #1. A  decision was made to remove her drain on postoperative day #5. I did  start sips of clear liquids on postoperative day #6 and the patient  developed flatus shortly following this. Her diet was advanced to a soft  diet on postoperative day #8 and regular diet on postoperative day #9.  Due to some mild drainage from the upper portion of her incision, my  partner, Dr. Deloria Lair, opened up the superior  portion of the wound.  This was packed with wet-to-dry dressings. No obvious purulent discharge  was noted. Decision was made to finally discharge the patient home on  postoperative day #11. She was instructed to continue to pack her upper  portion of her incision with wet-to-dry dressing changes b.i.d. She was  instructed to followup with me in 1 week. She was restarted on all prior  home medications. It does not appear that any new prescriptions were  given at the time of discharge. If she has any further problems or  concerns sooner, she was instructed to call me immediately. Final  pathology revealed transitional cell carcinoma of the bladder, T1, grade  3, with negative margins and negative nodes.                Adelina Collard P. Deniece Rankin, DO                This is an unverified document unless signed by physician.    TID: [id number]                                       DT: 11/02/2012 09:02 A  JOB: [id number]            DOC#: [id number]            DD: 11/01/2012    cc:   Remi Deter P. Akemi Overholser, DO

## 2012-11-03 NOTE — Progress Notes (Signed)
[facility]  [address]  [place], [place] [address]  [phone]    Cathy Jackson  DOB: Jun 23, 1946     HPI   67 y.o., female returns in follow up for bladder cancer. S/P anterior pelvic exonteration, B PLND and ileal conduit urinary diversion on 10/13/12. Final path was TCC T1G3NO with neg margins. She is doing well post op. She cont to pack her upper portion of her wound bid which was opened due to erythema. She is having reg bm's and denies and vaginal drainage. Denies fevers. Slowly progressing per her report.  Labs from last week were WNL.  Cont to report mild fatigue.        Past Medical History   Diagnosis Date   ??? Arthritis      generalized   ??? Environmental allergies      managed with medication    ??? Hypothyroidism      managed with medication    ??? Depression      managed with medication    ??? Bladder cancer    ??? History of kidney stones      states surgical removed   ??? Urethral caruncle 10/27/2012   ??? Unspecified disorder of bladder 10/27/2012   ??? Dysuria 10/27/2012   ??? Abdominal pain, unspecified site 10/27/2012   ??? Urinary frequency 10/27/2012   ??? Asthma 10/27/2012   ??? GERD (gastroesophageal reflux disease) 10/27/2012   ??? Urinary tract infection, site not specified 10/27/2012   ??? Other "heavy-for-dates" infants      ureterectomy and ileal conduit     Past Surgical History   Procedure Laterality Date   ??? Hx breast biopsy Left 2000     x 2   ??? Hx cesarean section       x 1   ??? Hx tubal ligation     ??? Pr close cystostomy       cystoscopy/ turbt x 2   ??? Hx wrist fracture tx Left    ??? Hx cholecystectomy       Current Outpatient Prescriptions   Medication Sig Dispense Refill   ??? ciprofloxacin (CIPRO) 250 mg tablet Take 1 Tab by mouth two (2) times a day for 7 days.  14 Tab  0   ??? azelastine (ASTELIN) 137 mcg nasal spray 1 Spray two (2) times a day. Use in each nostril as directed       ??? darifenacin (ENABLEX) 7.5 mg ER tablet Take  by mouth daily.       ??? estradiol (ESTRACE) 0.01 % (0.1 mg/gram) vaginal cream  Insert 2 g into vagina daily.       ??? omega-3 fatty acids-vitamin e (FISH OIL) 1,000 mg cap Take 1 Cap by mouth.       ??? neomycin (MYCIFRADIN) 500 mg tablet Take 500 mg by mouth. To take today at 12 pm, 6 pm, 11pm       ??? traMADol (ULTRAM) 50 mg tablet Take 50 mg by mouth four (4) times daily.       ??? polyethylene glycol (MIRALAX) 17 gram/dose powder Take 17 g by mouth daily.       ??? nystatin (MYCOSTATIN) topical cream Apply  to affected area two (2) times a day.       ??? levothyroxine (SYNTHROID) 25 mcg tablet Take 25 mcg by mouth Daily (before breakfast). Indications: HYPOTHYROIDISM       ??? lubiPROStone (AMITIZA) 24 mcg capsule Take 24 mcg by mouth two (2) times  daily (with meals). Indications: CHRONIC IDIOPATHIC CONSTIPATION       ??? montelukast (SINGULAIR) 10 mg tablet Take 10 mg by mouth nightly.       ??? baclofen (LIORESAL) 10 mg tablet Take 10 mg by mouth three (3) times daily. Indications: unsure why       ??? venlafaxine-SR (EFFEXOR XR) 150 mg capsule Take 150 mg by mouth every morning. Indications: GENERALIZED ANXIETY DISORDER       ??? gabapentin (NEURONTIN) 300 mg tablet Take 300 mg by mouth two (2) times a day.       ??? docusate sodium (COLACE) 100 mg capsule Take 100 mg by mouth two (2) times a day.       ??? HYDROcodone-acetaminophen (NORCO) 5-325 mg per tablet Take 1 Tab by mouth every four (4) hours as needed for Pain.  20 Tab  0     No Known Allergies  History     Social History   ??? Marital Status: MARRIED     Spouse Name: N/A     Number of Children: N/A   ??? Years of Education: N/A     Occupational History   ??? Not on file.     Social History Main Topics   ??? Smoking status: Former Smoker -- 0.50 packs/day for 20 years   ??? Smokeless tobacco: Never Used      Comment: stopped at age 6   ??? Alcohol Use: No   ??? Drug Use: No   ??? Sexually Active: Not on file     Other Topics Concern   ??? Not on file     Social History Narrative   ??? No narrative on file     Family History   Problem Relation Age of Onset   ??? Stroke  Mother    ??? Heart Disease Mother    ??? Cancer Father    ??? Asthma Father    ??? Lung Disease Father      COPD/smoker   ??? Cancer Brother    ??? Heart Disease Brother    ??? Alcohol abuse Brother        Review of Systems  All systems reviewed and are negative at this time.    Physical Exam  BP 102/64   Pulse 120   Temp(Src) 97.5 ??F (36.4 ??C) (Tympanic)   Ht 5\' 2"  (1.575 m)   Wt 145 lb (65.772 kg)   BMI 26.51 kg/m2  General appearance - alert, well appearing, and in no distress  Mental status - alert, oriented to person, place, and time  Eyes - extraocular eye movements intact, sclera anicteric  Abdomen - soft, nontender, nondistended, no masses or organomegaly.  Wound clean with excellent granulation.  Rest of staples removed.  Ostomy pink with clear UOP.  Neurological -  normal speech, no focal findings or movement disorder noted  Skin - normal coloration and turgor        Assessment/Plan    ICD-9-CM   1. Bladder cancer 188.9   RTO in one mo to reassess.  Cont to progress nicely.  Recommended BOOST shakes if appetite poor.    SAMUEL P STERRETT, DO

## 2012-11-23 ENCOUNTER — Other Ambulatory Visit: Payer: Self-pay | Admitting: Family Medicine

## 2012-11-24 NOTE — Telephone Encounter (Signed)
[facility] calling stating pt is being d/c from home health  Pt has appt with [place] on 7th and they state she is doing great Thank you

## 2012-11-24 NOTE — Telephone Encounter (Signed)
Just an FYI.

## 2012-11-29 NOTE — Progress Notes (Signed)
[facility]  [address]  [place], [place] [address]  [phone]    Cathy Jackson  DOB: December 16, 1945     HPI   67 y.o., female returns in follow up for bladder cancer.  S/P anterior pelvic exonteration, B PLND and ileal conduit urinary diversion on 10/13/12. Final path was TCC T1G3NO with neg margins. She is doing well post op.  Post op fatigue is slowly improving.  Wound has healed nicely.  Report occ nausea but denies fevers.        Past Medical History   Diagnosis Date   ??? Arthritis      generalized   ??? Environmental allergies      managed with medication    ??? Hypothyroidism      managed with medication    ??? Depression      managed with medication    ??? Bladder cancer    ??? History of kidney stones      states surgical removed   ??? Urethral caruncle 10/27/2012   ??? Unspecified disorder of bladder 10/27/2012   ??? Dysuria 10/27/2012   ??? Abdominal pain, unspecified site 10/27/2012   ??? Urinary frequency 10/27/2012   ??? Asthma 10/27/2012   ??? GERD (gastroesophageal reflux disease) 10/27/2012   ??? Urinary tract infection, site not specified 10/27/2012   ??? Other "heavy-for-dates" infants      ureterectomy and ileal conduit     Past Surgical History   Procedure Laterality Date   ??? Hx breast biopsy Left 2000     x 2   ??? Hx cesarean section       x 1   ??? Hx tubal ligation     ??? Pr close cystostomy       cystoscopy/ turbt x 2   ??? Hx wrist fracture tx Left    ??? Hx cholecystectomy       Current Outpatient Prescriptions   Medication Sig Dispense Refill   ??? azelastine (ASTELIN) 137 mcg nasal spray 1 Spray two (2) times a day. Use in each nostril as directed       ??? darifenacin (ENABLEX) 7.5 mg ER tablet Take  by mouth daily.       ??? estradiol (ESTRACE) 0.01 % (0.1 mg/gram) vaginal cream Insert 2 g into vagina daily.       ??? omega-3 fatty acids-vitamin e (FISH OIL) 1,000 mg cap Take 1 Cap by mouth.       ??? neomycin (MYCIFRADIN) 500 mg tablet Take 500 mg by mouth. To take today at 12 pm, 6 pm, 11pm       ??? traMADol (ULTRAM) 50 mg tablet Take  50 mg by mouth four (4) times daily.       ??? polyethylene glycol (MIRALAX) 17 gram/dose powder Take 17 g by mouth daily.       ??? nystatin (MYCOSTATIN) topical cream Apply  to affected area two (2) times a day.       ??? levothyroxine (SYNTHROID) 25 mcg tablet Take 25 mcg by mouth Daily (before breakfast). Indications: HYPOTHYROIDISM       ??? lubiPROStone (AMITIZA) 24 mcg capsule Take 24 mcg by mouth two (2) times daily (with meals). Indications: CHRONIC IDIOPATHIC CONSTIPATION       ??? montelukast (SINGULAIR) 10 mg tablet Take 10 mg by mouth nightly.       ??? baclofen (LIORESAL) 10 mg tablet Take 10 mg by mouth three (3) times daily. Indications: unsure why       ???  venlafaxine-SR (EFFEXOR XR) 150 mg capsule Take 150 mg by mouth every morning. Indications: GENERALIZED ANXIETY DISORDER       ??? gabapentin (NEURONTIN) 300 mg tablet Take 300 mg by mouth two (2) times a day.       ??? docusate sodium (COLACE) 100 mg capsule Take 100 mg by mouth two (2) times a day.         No Known Allergies  History     Social History   ??? Marital Status: MARRIED     Spouse Name: N/A     Number of Children: N/A   ??? Years of Education: N/A     Occupational History   ??? Not on file.     Social History Main Topics   ??? Smoking status: Former Smoker -- 0.50 packs/day for 20 years   ??? Smokeless tobacco: Never Used      Comment: stopped at age 22   ??? Alcohol Use: No   ??? Drug Use: No   ??? Sexually Active: Not on file     Other Topics Concern   ??? Not on file     Social History Narrative   ??? No narrative on file     Family History   Problem Relation Age of Onset   ??? Stroke Mother    ??? Heart Disease Mother    ??? Cancer Father    ??? Asthma Father    ??? Lung Disease Father      COPD/smoker   ??? Cancer Brother    ??? Heart Disease Brother    ??? Alcohol abuse Brother        Review of Systems  All systems reviewed and are negative at this time.    Physical Exam  BP 122/82   Pulse 120   Temp(Src) 99.3 ??F (37.4 ??C) (Tympanic)   Ht 5\' 2"  (1.575 m)   Wt 144 lb (65.318 kg)    BMI 26.33 kg/m2  General appearance - alert, well appearing, and in no distress  Mental status - alert, oriented to person, place, and time  Eyes - extraocular eye movements intact, sclera anicteric  Abdomen - soft, nontender, nondistended, no masses or organomegaly  Incision well healed.  Stoma pink and viable.  Urine clear.  Neurological -  normal speech, no focal findings or movement disorder noted  Skin - normal coloration and turgor          Assessment/Plan    ICD-9-CM   1. Bladder cancer 188.9     RTO in 2 mo with BMP prior.    Rielyn Krupinski P Toribio Seiber, DO

## 2013-02-07 NOTE — Progress Notes (Signed)
[facility]  [address]  [place], [place] [address]  [phone]    Cathy Jackson  DOB: 1946/02/19     HPI   67 y.o., female returns in follow up for CaB.  S/P anterior pelvic exonteration, B PLND and ileal conduit urinary diversion on 10/13/12. Final path was TCC T1G3NO with neg margins. She is doing well post op. No complaints today other than tinnitus in L ear that is being treated by Dr. Tawanna Cooler.        Past Medical History   Diagnosis Date   ??? Arthritis      generalized   ??? Environmental allergies      managed with medication    ??? Hypothyroidism      managed with medication    ??? Depression      managed with medication    ??? Bladder cancer    ??? History of kidney stones      states surgical removed   ??? Urethral caruncle 10/27/2012   ??? Unspecified disorder of bladder 10/27/2012   ??? Dysuria 10/27/2012   ??? Abdominal pain, unspecified site 10/27/2012   ??? Urinary frequency 10/27/2012   ??? Asthma 10/27/2012   ??? GERD (gastroesophageal reflux disease) 10/27/2012   ??? Urinary tract infection, site not specified 10/27/2012   ??? Other "heavy-for-dates" infants      ureterectomy and ileal conduit     Past Surgical History   Procedure Laterality Date   ??? Hx breast biopsy Left 2000     x 2   ??? Hx cesarean section       x 1   ??? Hx tubal ligation     ??? Pr close cystostomy       cystoscopy/ turbt x 2   ??? Hx wrist fracture tx Left    ??? Hx cholecystectomy       Current Outpatient Prescriptions   Medication Sig Dispense Refill   ??? azelastine (ASTELIN) 137 mcg nasal spray 1 Spray two (2) times a day. Use in each nostril as directed       ??? traMADol (ULTRAM) 50 mg tablet Take 50 mg by mouth four (4) times daily.       ??? polyethylene glycol (MIRALAX) 17 gram/dose powder Take 17 g by mouth daily.       ??? levothyroxine (SYNTHROID) 25 mcg tablet Take 25 mcg by mouth Daily (before breakfast). Indications: HYPOTHYROIDISM       ??? lubiPROStone (AMITIZA) 24 mcg capsule Take 24 mcg by mouth two (2) times daily (with meals). Indications: CHRONIC  IDIOPATHIC CONSTIPATION       ??? montelukast (SINGULAIR) 10 mg tablet Take 10 mg by mouth nightly.       ??? docusate sodium (COLACE) 100 mg capsule Take 100 mg by mouth two (2) times a day.       ??? venlafaxine-SR (EFFEXOR XR) 150 mg capsule Take 150 mg by mouth every morning. Indications: GENERALIZED ANXIETY DISORDER         No Known Allergies  History     Social History   ??? Marital Status: MARRIED     Spouse Name: N/A     Number of Children: N/A   ??? Years of Education: N/A     Occupational History   ??? Not on file.     Social History Main Topics   ??? Smoking status: Former Smoker -- 0.50 packs/day for 20 years   ??? Smokeless tobacco: Never Used  Comment: stopped at age 27   ??? Alcohol Use: No   ??? Drug Use: No   ??? Sexually Active: Not on file     Other Topics Concern   ??? Not on file     Social History Narrative   ??? No narrative on file     Family History   Problem Relation Age of Onset   ??? Stroke Mother    ??? Heart Disease Mother    ??? Cancer Father    ??? Asthma Father    ??? Lung Disease Father      COPD/smoker   ??? Cancer Brother    ??? Heart Disease Brother    ??? Alcohol abuse Brother        Review of Systems  All systems reviewed and are negative at this time.    Physical Exam  BP 160/88   Pulse 86   Temp(Src) 98.6 ??F (37 ??C) (Tympanic)   Ht 5\' 2"  (1.575 m)   Wt 143 lb (64.864 kg)   BMI 26.15 kg/m2  General appearance - alert, well appearing, and in no distress  Mental status - alert, oriented to person, place, and time  Eyes - extraocular eye movements intact, sclera anicteric  Abdomen - soft, nontender, nondistended, no masses or organomegaly  Midline incision healing well.  Stoma pink and viable.  Neurological -  normal speech, no focal findings or movement disorder noted  Skin - normal coloration and turgor          Assessment/Plan    ICD-9-CM   1. Bladder cancer 188.9     BMP drawn today.  RTO in 3 mo with BMP and CT C/A/P pelvis prior.    [name], DO

## 2013-02-08 LAB — METABOLIC PANEL, BASIC
BUN/Creatinine ratio: 23 (ref 11–26)
BUN: 17 mg/dL (ref 8–27)
CO2: 26 mmol/L (ref 18–29)
Calcium: 9.5 mg/dL (ref 8.6–10.2)
Chloride: 101 mmol/L (ref 97–108)
Creatinine: 0.74 mg/dL (ref 0.57–1.00)
GFR est AA: 96 mL/min/{1.73_m2} (ref >59)
GFR est non-AA: 83 mL/min/{1.73_m2} (ref >59)
Glucose: 109 mg/dL — ABNORMAL HIGH (ref 65–99)
Potassium: 5 mmol/L (ref 3.5–5.2)
Sodium: 142 mmol/L (ref 134–144)

## 2013-02-08 NOTE — Progress Notes (Signed)
Quick Note:    Please let pt know that electrolytes and kidney function looks great.  ______

## 2013-02-09 NOTE — Progress Notes (Signed)
Quick Note:    See telephone encounter. db  ______

## 2013-02-09 NOTE — Telephone Encounter (Signed)
Pt aware of normal lab results. Db

## 2013-03-23 ENCOUNTER — Other Ambulatory Visit (HOSPITAL_COMMUNITY): Payer: Self-pay | Admitting: Neurosurgery

## 2013-03-23 DIAGNOSIS — D329 Benign neoplasm of meninges, unspecified: Secondary | ICD-10-CM

## 2013-03-28 ENCOUNTER — Ambulatory Visit (HOSPITAL_COMMUNITY)
Admission: RE | Admit: 2013-03-28 | Discharge: 2013-03-28 | Disposition: A | Discharge status: Home / Self Care | Payer: Medicare PPO | Source: Ambulatory Visit | Attending: Neurosurgery | Admitting: Neurosurgery

## 2013-03-28 ENCOUNTER — Encounter (HOSPITAL_COMMUNITY): Payer: Self-pay

## 2013-03-28 DIAGNOSIS — D320 Benign neoplasm of cerebral meninges: Secondary | ICD-10-CM | POA: Insufficient documentation

## 2013-03-28 DIAGNOSIS — D329 Benign neoplasm of meninges, unspecified: Secondary | ICD-10-CM

## 2013-03-28 DIAGNOSIS — Z09 Encounter for follow-up examination after completed treatment for conditions other than malignant neoplasm: Secondary | ICD-10-CM | POA: Insufficient documentation

## 2013-03-28 MED ORDER — GADOBENATE DIMEGLUMINE 529 MG/ML IV SOLN
15.0000 mL | Freq: Once | INTRAVENOUS | Status: AC | PRN
  Administered 2013-03-28: 15 mL via INTRAVENOUS

## 2013-03-30 LAB — POCT I-STAT, CHEM 8
Calcium, Ion: 1.11 mmol/L — ABNORMAL LOW (ref 1.13–1.30)
Chloride: 105 mEq/L (ref 96–112)
Creatinine, Ser: 0.9 mg/dL (ref 0.50–1.10)
Glucose, Bld: 101 mg/dL — ABNORMAL HIGH (ref 70–99)
HCT: 40 % (ref 36.0–46.0)
Hemoglobin: 13.6 g/dL (ref 12.0–15.0)

## 2013-04-11 ENCOUNTER — Encounter: Payer: Self-pay | Admitting: Family Medicine

## 2013-04-11 ENCOUNTER — Ambulatory Visit (INDEPENDENT_AMBULATORY_CARE_PROVIDER_SITE_OTHER): Payer: Medicare PPO | Admitting: Family Medicine

## 2013-04-11 VITALS — BP 128/70 | HR 58 | Temp 96.9°F | Ht 65.0 in | Wt 168.0 lb

## 2013-04-11 DIAGNOSIS — M8580 Other specified disorders of bone density and structure, unspecified site: Secondary | ICD-10-CM

## 2013-04-11 DIAGNOSIS — M25561 Pain in right knee: Secondary | ICD-10-CM

## 2013-04-11 DIAGNOSIS — M899 Disorder of bone, unspecified: Secondary | ICD-10-CM

## 2013-04-11 DIAGNOSIS — M25569 Pain in unspecified knee: Secondary | ICD-10-CM

## 2013-04-11 DIAGNOSIS — Z0000 Encounter for general adult medical examination without abnormal findings: Secondary | ICD-10-CM

## 2013-04-11 DIAGNOSIS — Z1211 Encounter for screening for malignant neoplasm of colon: Secondary | ICD-10-CM

## 2013-04-11 DIAGNOSIS — I10 Essential (primary) hypertension: Secondary | ICD-10-CM

## 2013-04-11 NOTE — Assessment & Plan Note (Addendum)
likley OA, she does not want any further meds or referrals Advised apercreme  okay to continue water aerobics

## 2013-04-11 NOTE — Patient Instructions (Addendum)
Return for fasting labs on Friday Try Aspercreme for the knee Referral for colonoscopy Mammogram/Bone Density to be set up F/U 4 months

## 2013-04-11 NOTE — Assessment & Plan Note (Signed)
Well controlled 

## 2013-04-11 NOTE — Progress Notes (Signed)
Subjective:    Patient ID: Molly Patel, female    DOB: [birth year], 67 y.o.   MRN: [id number]  HPI Pt here for CPE. S/p hysterectomy.  Complains of right knee pain on and off past few months, had a fall to knees in the past, non recently. Works out at [employer], had had some swelling of knee a few months ago. No OTC meds used. Has not been taking red yeast rice. Due for fasting labs  Remebers told had some low bone mass and needed extra vitamin D in past. Due for bone density Has lot weight intentially   Subjective:   Patient presents for Medicare Annual/Subsequent preventive examination.   Review Past Medical/Family/Social: per EMR   Risk Factors  Current exercise habits: [employer] water arerob[employer] Dietary issues discussed: yes  Cardiac risk factors: BMI 27, HTN  Depression Screen  (Note: if answer to either of the following is "Yes", a more complete depression screening is indicated)  Over the past two weeks, have you felt down, depressed or hopeless? No Over the past two weeks, have you felt little interest or pleasure in doing things? No Have you lost interest or pleasure in daily life? No Do you often feel hopeless? No Do you cry easily over simple problems? No   Activities of Daily Living  In your present state of health, do you have any difficulty performing the following activities?:  Driving? No  Managing money? No  Feeding yourself? No  Getting from bed to chair? No  Climbing a flight of stairs? No  Preparing food and eating?: No  Bathing or showering? No  Getting dressed: No  Getting to the toilet? No  Using the toilet:No  Moving around from place to place: No  In the past year have you fallen or had a near fall?:No  Are you sexually active? No  Do you have more than one partner? No   Hearing Difficulties: No  Do you often ask people to speak up or repeat themselves? No  Do you experience ringing or noises in your ears? No Do you have difficulty understanding  soft or whispered voices? No  Do you feel that you have a problem with memory? No Do you often misplace items? No  Do you feel safe at home? Yes  Cognitive Testing  Alert? Yes Normal Appearance?Yes  Oriented to person? Yes Place? Yes  Time? Yes  Recall of three objects? Yes  Can perform simple calculations? Yes  Displays appropriate judgment?Yes  Can read the correct time from a watch face?Yes   List the Names of Other Physician/Practitioners you currently use: [facility], opthalmology     Screening Tests / Date Colonoscopy   - Due for               Zostavax - declines Mammogram - UTD Influenza Vaccine  Tetanus/tdap- declines    Assessment:    Annual wellness medicare exam   Plan:    During the course of the visit the patient was educated and counseled about appropriate screening and preventive services including:  Screening mammography  Colorectal cancer screening  Shingles vaccine. Prescription given to that she can get the vaccine at the pharmacy or Medicare part D.  Screen negative for depression.  Diet review for nutrition referral? Yes ____ Not Indicated __x__  Patient Instructions (the written plan) was given to the patient.  Medicare Attestation  I have personally reviewed:  The patient's medical and social history  Their use of alcohol, tobacco  or illicit drugs  Their current medications and supplements  The patient's functional ability including ADLs,fall risks, home safety risks, cognitive, and hearing and visual impairment  Diet and physical activities  Evidence for depression or mood disorders  The patient's weight, height, BMI, and visual acuity have been recorded in the chart. I have made referrals, counseling, and provided education to the patient based on review of the above and I have provided the patient with a written personalized care plan for preventive services.        Review of Systems   GEN- denies fatigue, fever, weight  loss,weakness, recent illness HEENT- denies eye drainage, change in vision, nasal discharge, CVS- denies chest pain, palpitations RESP- denies SOB, cough, wheeze ABD- denies N/V, change in stools, abd pain GU- denies dysuria, hematuria, dribbling, incontinence MSK-+ joint pain, muscle aches, injury Neuro- denies headache, dizziness, syncope, seizure activity      Objective:   Physical Exam GEN- NAD, alert and oriented x3 HEENT- PERRL, EOMI, non injected sclera, pink conjunctiva, MMM, oropharynx clear Neck- Supple, no bruit CVS- RRR, no murmur RESP-CTAB ABD-NABS,soft,NTND MSK- Right knee- no effusion, appears larger than left, +crepitus, fair ROM, ligaments in tact EXT- No edema Pulses- Radial, DP- 2+        Assessment & Plan:

## 2013-04-12 ENCOUNTER — Encounter (INDEPENDENT_AMBULATORY_CARE_PROVIDER_SITE_OTHER): Payer: Self-pay | Admitting: *Deleted

## 2013-04-13 ENCOUNTER — Encounter: Payer: Self-pay | Admitting: Family Medicine

## 2013-04-15 ENCOUNTER — Other Ambulatory Visit: Payer: Medicare PPO

## 2013-04-15 ENCOUNTER — Other Ambulatory Visit (HOSPITAL_COMMUNITY): Payer: Medicare PPO

## 2013-04-15 DIAGNOSIS — I10 Essential (primary) hypertension: Secondary | ICD-10-CM

## 2013-04-15 DIAGNOSIS — Z79899 Other long term (current) drug therapy: Secondary | ICD-10-CM

## 2013-04-15 DIAGNOSIS — E785 Hyperlipidemia, unspecified: Secondary | ICD-10-CM

## 2013-04-15 DIAGNOSIS — Z0000 Encounter for general adult medical examination without abnormal findings: Secondary | ICD-10-CM

## 2013-04-15 LAB — CBC WITH DIFFERENTIAL/PLATELET
Basophils Absolute: 0 10*3/uL (ref 0.0–0.1)
Eosinophils Relative: 1 % (ref 0–5)
Lymphocytes Relative: 45 % (ref 12–46)
Lymphs Abs: 1.9 10*3/uL (ref 0.7–4.0)
MCV: 88.7 fL (ref 78.0–100.0)
Neutro Abs: 1.9 10*3/uL (ref 1.7–7.7)
Platelets: 212 10*3/uL (ref 150–400)
RBC: 4.33 MIL/uL (ref 3.87–5.11)
RDW: 13.8 % (ref 11.5–15.5)
WBC: 4.2 10*3/uL (ref 4.0–10.5)

## 2013-04-15 LAB — COMPLETE METABOLIC PANEL WITH GFR
ALT: 32 U/L (ref 0–35)
AST: 23 U/L (ref 0–37)
Albumin: 3.8 g/dL (ref 3.5–5.2)
Alkaline Phosphatase: 71 U/L (ref 39–117)
GFR, Est Non African American: 75 mL/min
Glucose, Bld: 75 mg/dL (ref 70–99)
Potassium: 3.7 mEq/L (ref 3.5–5.3)
Sodium: 141 mEq/L (ref 135–145)
Total Bilirubin: 0.4 mg/dL (ref 0.3–1.2)
Total Protein: 6.5 g/dL (ref 6.0–8.3)

## 2013-04-15 LAB — LIPID PANEL
LDL Cholesterol: 161 mg/dL — ABNORMAL HIGH (ref 0–99)
Total CHOL/HDL Ratio: 3.5 Ratio

## 2013-04-15 LAB — TSH: TSH: 1.878 u[IU]/mL (ref 0.350–4.500)

## 2013-04-16 LAB — VITAMIN D 25 HYDROXY (VIT D DEFICIENCY, FRACTURES): Vit D, 25-Hydroxy: 44 ng/mL (ref 30–89)

## 2013-04-19 ENCOUNTER — Telehealth: Payer: Self-pay | Admitting: Family Medicine

## 2013-04-19 MED ORDER — SIMVASTATIN 20 MG PO TABS: 20.0000 mg | ORAL_TABLET | Freq: Every day | ORAL | Status: DC

## 2013-04-19 NOTE — Telephone Encounter (Signed)
Pt made aware of lab results.  New RX for simvastatin sent to pt mail order per her request.  Confirmed Dexa scan appt next week and f/u appt Dr Jeanice Lim for December.

## 2013-04-19 NOTE — Telephone Encounter (Signed)
Message copied by Donne Anon on Tue Apr 19, 2013  8:33 AM ------      Message from: Milinda Antis F      Created: Mon Apr 18, 2013  8:56 AM       Please let pt know her cholesterol has increased  Her LDL "bad cholesterol" is now 161      Vit D and thyroid level is normal      Renal function, kidney function normal      No anemia            I know she has been trying red yeast rice, but levels are increasing, I recommend she try prescription cholesterol medication, Zocor 20mg  1 tablet at bedtime      If pt agrees please call in. Zocor 20mg , 1 po QHS #30, R 3 ------

## 2013-04-26 MED ADMIN — ioversol (OPTIRAY) 350 mg iodine/mL contrast solution 125 mL: INTRAVENOUS | @ 20:00:00 | NDC 00019133316

## 2013-04-26 MED ADMIN — sodium chloride 0.9 % bolus infusion 100 mL: INTRAVENOUS | @ 20:00:00 | NDC 00409798423

## 2013-04-26 MED ADMIN — sodium chloride (NS) flush 5-10 mL: INTRAVENOUS | @ 20:00:00 | NDC 87701099893

## 2013-04-26 MED ADMIN — diatrizoate meglumine & sodium (MD-GASTROVIEW,GASTROGRAFIN) 66-10 % contrast solution 30 mL: ORAL | @ 20:00:00 | NDC 00019481604

## 2013-04-29 ENCOUNTER — Ambulatory Visit (HOSPITAL_COMMUNITY)
Admission: RE | Admit: 2013-04-29 | Discharge: 2013-04-29 | Disposition: A | Discharge status: Home / Self Care | Payer: Medicare PPO | Source: Ambulatory Visit | Attending: Family Medicine | Admitting: Family Medicine

## 2013-04-29 DIAGNOSIS — Z780 Asymptomatic menopausal state: Secondary | ICD-10-CM | POA: Insufficient documentation

## 2013-04-29 DIAGNOSIS — M8580 Other specified disorders of bone density and structure, unspecified site: Secondary | ICD-10-CM

## 2013-05-16 NOTE — Progress Notes (Signed)
[facility]  [address]  [place], [place] [address]  [phone]    Cathy Jackson  DOB: 1946-01-31     HPI   67 y.o., female returns in follow up for CaB.  S/P anterior pelvic exonteration, B PLND and ileal conduit urinary diversion on 10/13/12. Final path was TCC T1G3NO with neg margins.  She has done well post op.  BMP at last visit was unremarkable.  CT C/A/P on 04/26/13 showed mild R collecting system prominence without recurrence noted.  She cont to report mild vaginal leakage.         Past Medical History   Diagnosis Date   ??? Arthritis      generalized   ??? Environmental allergies      managed with medication    ??? Hypothyroidism      managed with medication    ??? Depression      managed with medication    ??? Bladder cancer    ??? History of kidney stones      states surgical removed   ??? Urethral caruncle 10/27/2012   ??? Unspecified disorder of bladder 10/27/2012   ??? Dysuria 10/27/2012   ??? Abdominal pain, unspecified site 10/27/2012   ??? Urinary frequency 10/27/2012   ??? Asthma 10/27/2012   ??? GERD (gastroesophageal reflux disease) 10/27/2012   ??? Urinary tract infection, site not specified 10/27/2012   ??? Other "heavy-for-dates" infants      ureterectomy and ileal conduit     Past Surgical History   Procedure Laterality Date   ??? Hx breast biopsy Left 2000     x 2   ??? Hx cesarean section       x 1   ??? Hx tubal ligation     ??? Pr close cystostomy       cystoscopy/ turbt x 2   ??? Hx wrist fracture tx Left    ??? Hx cholecystectomy       Current Outpatient Prescriptions   Medication Sig Dispense Refill   ??? azelastine (ASTELIN) 137 mcg nasal spray 1 Spray two (2) times a day. Use in each nostril as directed       ??? traMADol (ULTRAM) 50 mg tablet Take 50 mg by mouth four (4) times daily.       ??? polyethylene glycol (MIRALAX) 17 gram/dose powder Take 17 g by mouth daily.       ??? levothyroxine (SYNTHROID) 25 mcg tablet Take 25 mcg by mouth Daily (before breakfast). Indications: HYPOTHYROIDISM       ??? lubiPROStone (AMITIZA) 24 mcg  capsule Take 24 mcg by mouth two (2) times daily (with meals). Indications: CHRONIC IDIOPATHIC CONSTIPATION       ??? montelukast (SINGULAIR) 10 mg tablet Take 10 mg by mouth nightly.       ??? docusate sodium (COLACE) 100 mg capsule Take 100 mg by mouth two (2) times a day.       ??? venlafaxine-SR (EFFEXOR XR) 150 mg capsule Take 150 mg by mouth every morning. Indications: GENERALIZED ANXIETY DISORDER         No Known Allergies  History     Social History   ??? Marital Status: MARRIED     Spouse Name: N/A     Number of Children: N/A   ??? Years of Education: N/A     Occupational History   ??? Not on file.     Social History Main Topics   ??? Smoking status: Former Smoker -- 0.50 packs/day  for 20 years   ??? Smokeless tobacco: Never Used      Comment: stopped at age 64   ??? Alcohol Use: No   ??? Drug Use: No   ??? Sexually Active: Not on file     Other Topics Concern   ??? Not on file     Social History Narrative   ??? No narrative on file     Family History   Problem Relation Age of Onset   ??? Stroke Mother    ??? Heart Disease Mother    ??? Cancer Father    ??? Asthma Father    ??? Lung Disease Father      COPD/smoker   ??? Cancer Brother    ??? Heart Disease Brother    ??? Alcohol abuse Brother        Review of Systems  All systems reviewed and are negative at this time.    Physical Exam  BP 138/77   Pulse 105   Temp(Src) 99 ??F (37.2 ??C)   Ht 5\' 2"  (1.575 m)   Wt 141 lb (63.957 kg)   BMI 25.78 kg/m2  General appearance - alert, well appearing, and in no distress  Mental status - alert, oriented to person, place, and time  Eyes - extraocular eye movements intact, sclera anicteric  Abdomen - soft, nontender, nondistended, no masses or organomegaly  Stoma remains pink with nice rosebud formation.  Pelvic- no masses or leakage noted.    Neurological -  normal speech, no focal findings or movement disorder noted  Skin - normal coloration and turgor      Assessment/Plan    ICD-9-CM   1. Bladder cancer 188.9     BMP drawn today.  RTO in 6 mo with BMP and CT  C/A/P prior.    Dillion Stowers P Kamani Magnussen, DO

## 2013-05-17 LAB — METABOLIC PANEL, BASIC
BUN/Creatinine ratio: 21 (ref 11–26)
BUN: 13 mg/dL (ref 8–27)
CO2: 25 mmol/L (ref 18–29)
Calcium: 8.9 mg/dL (ref 8.6–10.2)
Chloride: 100 mmol/L (ref 97–108)
Creatinine: 0.61 mg/dL (ref 0.57–1.00)
GFR est AA: 107 mL/min/{1.73_m2} (ref >59)
GFR est non-AA: 93 mL/min/{1.73_m2} (ref >59)
Glucose: 92 mg/dL (ref 65–99)
Potassium: 4.1 mmol/L (ref 3.5–5.2)
Sodium: 143 mmol/L (ref 134–144)

## 2013-05-17 NOTE — Progress Notes (Signed)
Quick Note:    Please let her know that her labs look great!  ______

## 2013-05-17 NOTE — Progress Notes (Signed)
Quick Note:    Called and notified the pt of the info below/dh  ______

## 2013-06-16 ENCOUNTER — Encounter: Payer: Self-pay | Admitting: *Deleted

## 2013-08-15 ENCOUNTER — Ambulatory Visit (INDEPENDENT_AMBULATORY_CARE_PROVIDER_SITE_OTHER): Payer: Medicare PPO | Admitting: Family Medicine

## 2013-08-15 ENCOUNTER — Encounter: Payer: Self-pay | Admitting: Family Medicine

## 2013-08-15 VITALS — BP 120/82 | HR 82 | Temp 97.3°F | Resp 18 | Ht 65.0 in | Wt 173.0 lb

## 2013-08-15 DIAGNOSIS — I10 Essential (primary) hypertension: Secondary | ICD-10-CM

## 2013-08-15 DIAGNOSIS — Z9889 Other specified postprocedural states: Secondary | ICD-10-CM

## 2013-08-15 DIAGNOSIS — G4700 Insomnia, unspecified: Secondary | ICD-10-CM

## 2013-08-15 DIAGNOSIS — Z23 Encounter for immunization: Secondary | ICD-10-CM

## 2013-08-15 DIAGNOSIS — E785 Hyperlipidemia, unspecified: Secondary | ICD-10-CM

## 2013-08-15 MED ORDER — HYDROCHLOROTHIAZIDE 25 MG PO TABS: 25.0000 mg | ORAL_TABLET | Freq: Every day | ORAL | Status: DC

## 2013-08-15 MED ORDER — ATENOLOL 50 MG PO TABS: ORAL_TABLET | ORAL | Status: DC

## 2013-08-15 MED ORDER — PHENYTOIN SODIUM EXTENDED 100 MG PO CAPS: 100.0000 mg | ORAL_CAPSULE | Freq: Three times a day (TID) | ORAL | Status: DC

## 2013-08-15 NOTE — Assessment & Plan Note (Signed)
Recheck fasting lipid panel in her LFTs

## 2013-08-15 NOTE — Progress Notes (Signed)
   Subjective:    Patient ID: Molly Patel, female    DOB: [birth year], 67 y.o.   MRN: [id number]  HPI Patient to follow chronic medical problems. Her last visit she was started on simvastatin secondary to worsen hyperlipidemia. She has no specific concerns today. She is overall doing well. She did have ophthalmology exam which she was told she had dry eye syndrome otherwise everything else was normal. He does request refill on her Dilantin she's having difficulty getting this from her neurosurgeon. Flu shot today   Review of Systems  GEN- denies fatigue, fever, weight loss,weakness, recent illness HEENT- denies eye drainage, change in vision, nasal discharge, CVS- denies chest pain, palpitations RESP- denies SOB, cough, wheeze ABD- denies N/V, change in stools, abd pain GU- denies dysuria, hematuria, dribbling, incontinence MSK- denies joint pain, muscle aches, injury Neuro- denies headache, dizziness, syncope, seizure activity      Objective:   Physical Exam  GEN- NAD, alert and oriented x3 HEENT- PERRL, EOMI, non injected sclera, pink conjunctiva, MMM, oropharynx clear, incision from craniotomy D/C/I, small area of puffiness -Left forehead, no erythema, no warmth Neck- Supple,  CVS- RRR, no murmur RESP-CTAB EXT- No edema Pulses- Radial 2+       Assessment & Plan:

## 2013-08-15 NOTE — Assessment & Plan Note (Signed)
Status post craniotomy secondary to meningioma. I refilled her Dilantin she's not had any seizure episodes. She has very minimal swelling around the incision I see no evidence of infection. She states that this does come and go we will monitor it for now.

## 2013-08-15 NOTE — Assessment & Plan Note (Signed)
Note she is no longer using the temazepam. She is using a warm beverages and occasionally Advil PM

## 2013-08-15 NOTE — Patient Instructions (Addendum)
Continue current medications We will call with cholesterol result and medications Flu shot given  F/U 4 months

## 2013-08-15 NOTE — Assessment & Plan Note (Signed)
Blood pressure well controlled

## 2013-08-16 LAB — COMPREHENSIVE METABOLIC PANEL
ALT: 24 U/L (ref 0–35)
BUN: 10 mg/dL (ref 6–23)
CO2: 24 mEq/L (ref 19–32)
Calcium: 8.9 mg/dL (ref 8.4–10.5)
Chloride: 102 mEq/L (ref 96–112)
Creat: 0.63 mg/dL (ref 0.50–1.10)

## 2013-08-16 LAB — LIPID PANEL
HDL: 65 mg/dL (ref >39)
Total CHOL/HDL Ratio: 3.4 Ratio
VLDL: 42 mg/dL — ABNORMAL HIGH (ref 0–40)

## 2013-08-23 ENCOUNTER — Telehealth: Payer: Self-pay | Admitting: Family Medicine

## 2013-08-23 MED ORDER — SIMVASTATIN 40 MG PO TABS: 40.0000 mg | ORAL_TABLET | Freq: Every day | ORAL | Status: DC

## 2013-08-23 NOTE — Addendum Note (Signed)
Addended by: Elvina Mattes T on: 08/23/2013 03:43 PM   Modules accepted: Orders

## 2013-08-23 NOTE — Telephone Encounter (Signed)
Pt is calling about her lab results Call back number is [phone]

## 2013-10-03 ENCOUNTER — Telehealth: Payer: Self-pay | Admitting: Family Medicine

## 2013-10-03 NOTE — Telephone Encounter (Signed)
Call back number is [phone] Pharmacy [facility] Pt is needing a refill on phenytoin (DILANTIN) 100 MG ER capsule

## 2013-10-04 MED ORDER — PHENYTOIN SODIUM EXTENDED 100 MG PO CAPS: 100.0000 mg | ORAL_CAPSULE | Freq: Three times a day (TID) | ORAL | Status: DC

## 2013-10-04 NOTE — Telephone Encounter (Signed)
Meds refilled.

## 2013-10-05 NOTE — Telephone Encounter (Signed)
Spoke with patient and she said that she is now going to have to get her catheter supplies through [facility] and their number is [phone].  Called [facility] and spoke with Angelica and she states that they are faxing over the information to get a new script for patient's catheter supplies.  Please look for fax and fill out and send back      rslevy

## 2013-10-05 NOTE — Telephone Encounter (Signed)
Pt called and needs codes so that the pt can get catheter supplies, and so her insurance will cover it. Pt does not know where to get the supplies at. Please call pt and advise.

## 2013-10-05 NOTE — Telephone Encounter (Signed)
Pt calling again to check on this. She was told by [facility] that the MD has to call with the "codes".  Please call her back and let her know where we are at. She states she does not know where to go to get her supplies.Thank you.

## 2013-10-06 ENCOUNTER — Telehealth: Payer: Self-pay | Admitting: *Deleted

## 2013-10-06 MED ORDER — PHENYTOIN SODIUM EXTENDED 100 MG PO CAPS: 100.0000 mg | ORAL_CAPSULE | Freq: Three times a day (TID) | ORAL | Status: DC

## 2013-10-06 NOTE — Telephone Encounter (Signed)
Pt called wanting to have her prescription Phenytoin 100mg  to mail order because her local pharmacy was costing to much. Script sent to [facility]

## 2013-10-19 ENCOUNTER — Telehealth

## 2013-10-19 NOTE — Telephone Encounter (Signed)
Pt is scheduled for a ct chest/ abd/pel with and with out contrast.  Radiology is stating that the order needs to be changed to Ct chest/abd/pel with contrast.

## 2013-10-19 NOTE — Telephone Encounter (Signed)
Agree and approve.

## 2013-10-19 NOTE — Telephone Encounter (Signed)
Dr. Myrna Blazer, do you approve? Please advise. tb

## 2013-10-20 NOTE — Telephone Encounter (Signed)
Order for CT chest / abd/ pelvis with contrast sent to [facility].

## 2013-10-21 ENCOUNTER — Telehealth

## 2013-10-21 NOTE — Telephone Encounter (Signed)
Called [facility] at the number listed in the previous note to see if an order for catheters supplies had been recv'd. I was on hold for a very long time. Will need to check on it later. tb

## 2013-10-24 NOTE — Telephone Encounter (Signed)
Manus Gunning in radiology called and they need ct order to state Ct chest/ abd/ pelvis with contrast only.  Please reorder. Thanks!

## 2013-10-24 NOTE — Telephone Encounter (Signed)
Done.Jacobe Study

## 2013-10-26 ENCOUNTER — Encounter

## 2013-11-11 LAB — MRI/CT POC CREATININE
Creatinine (POC): 0.7 MG/DL (ref 0.6–1.0)
GFRAA, POC: >60 mL/min/{1.73_m2} (ref >60)
GFRNA, POC: >60 mL/min/{1.73_m2} (ref >60)

## 2013-11-11 MED ORDER — IOPAMIDOL 76 % IV SOLN
370 mg iodine /mL (76 %) | Freq: Once | INTRAVENOUS | Status: AC
Start: 2013-11-11 — End: 2013-11-11
  Administered 2013-11-11: 15:00:00 via INTRAVENOUS

## 2013-11-11 MED ORDER — SALINE PERIPHERAL FLUSH PRN
Freq: Once | INTRAMUSCULAR | Status: AC
Start: 2013-11-11 — End: 2013-11-11
  Administered 2013-11-11: 15:00:00

## 2013-11-11 MED ORDER — DIATRIZOATE MEGLUMINE & SODIUM 66 %-10 % ORAL SOLN
66-10 % | Freq: Once | ORAL | Status: AC
Start: 2013-11-11 — End: 2013-11-11
  Administered 2013-11-11: 15:00:00 via ORAL

## 2013-11-11 MED ORDER — SODIUM CHLORIDE 0.9% BOLUS IV
0.9 % | Freq: Once | INTRAVENOUS | Status: AC
Start: 2013-11-11 — End: 2013-11-11
  Administered 2013-11-11: 15:00:00 via INTRAVENOUS

## 2013-11-14 NOTE — Progress Notes (Signed)
[facility]  [address] [place] [address]  [phone]    Cathy Jackson  DOB: [birth year]     HPI   68 y.o., female returns in follow up for CaB. S/P anterior pelvic exonteration, B PLND and ileal conduit urinary diversion on 10/13/12. Final path was TCC T1G3NO with neg margins. She has done well post op. BMP at last visit was unremarkable. CT C/A/P on 11/11/13 shows a new 75mm R renal hypodensity without obvious recurrence noted.  Her vaginal leakage is now rare and scant.  No recent UTI's or new complaints.       Past Medical History   Diagnosis Date   ??? Arthritis      generalized   ??? Environmental allergies      managed with medication    ??? Hypothyroidism      managed with medication    ??? Depression      managed with medication    ??? Bladder cancer    ??? History of kidney stones      states surgical removed   ??? Urethral caruncle 10/27/2012   ??? Unspecified disorder of bladder 10/27/2012   ??? Dysuria 10/27/2012   ??? Abdominal pain, unspecified site 10/27/2012   ??? Urinary frequency 10/27/2012   ??? Asthma 10/27/2012   ??? GERD (gastroesophageal reflux disease) 10/27/2012   ??? Urinary tract infection, site not specified 10/27/2012   ??? Other "heavy-for-dates" infants      ureterectomy and ileal conduit     Past Surgical History   Procedure Laterality Date   ??? Hx breast biopsy Left 2000     x 2   ??? Hx cesarean section       x 1   ??? Hx tubal ligation     ??? Pr close cystostomy       cystoscopy/ turbt x 2   ??? Hx wrist fracture tx Left    ??? Hx cholecystectomy       Current Outpatient Prescriptions   Medication Sig Dispense Refill   ??? nystatin (MYCOSTATIN) topical cream Apply  to affected area two (2) times a day.       ??? clobetasol (TEMOVATE) 0.05 % topical cream Apply  to affected area two (2) times a day.       ??? estradiol (ESTRACE) 0.01 % (0.1 mg/gram) vaginal cream Insert 2 g into vagina daily.       ??? HYDROcodone-acetaminophen (NORCO) 7.5-325 mg per tablet Take  by mouth.       ??? meloxicam (MOBIC) 15 mg tablet Take 15 mg  by mouth daily.       ??? escitalopram oxalate (LEXAPRO) 10 mg tablet Take 10 mg by mouth daily.       ??? simvastatin (ZOCOR) 20 mg tablet Take  by mouth nightly.       ??? fluticasone (FLONASE) 50 mcg/actuation nasal spray nightly.       ??? meclizine (ANTIVERT) 25 mg tablet Take  by mouth three (3) times daily as needed.       ??? baclofen (LIORESAL) 10 mg tablet Take  by mouth three (3) times daily.       ??? traMADol (ULTRAM) 50 mg tablet Take 50 mg by mouth four (4) times daily.       ??? polyethylene glycol (MIRALAX) 17 gram/dose powder Take 17 g by mouth daily.       ??? levothyroxine (SYNTHROID) 25 mcg tablet Take 25 mcg by mouth Daily (before breakfast). Indications:  HYPOTHYROIDISM       ??? lubiPROStone (AMITIZA) 24 mcg capsule Take 24 mcg by mouth two (2) times daily (with meals). Indications: CHRONIC IDIOPATHIC CONSTIPATION       ??? montelukast (SINGULAIR) 10 mg tablet Take 10 mg by mouth nightly.       ??? docusate sodium (COLACE) 100 mg capsule Take 100 mg by mouth two (2) times a day.       ??? azelastine (ASTELIN) 137 mcg nasal spray 1 Spray two (2) times a day. Use in each nostril as directed       ??? venlafaxine-SR (EFFEXOR XR) 150 mg capsule Take 150 mg by mouth every morning. Indications: GENERALIZED ANXIETY DISORDER         No Known Allergies  History     Social History   ??? Marital Status: MARRIED     Spouse Name: N/A     Number of Children: N/A   ??? Years of Education: N/A     Occupational History   ??? Not on file.     Social History Main Topics   ??? Smoking status: Former Smoker -- 0.50 packs/day for 20 years   ??? Smokeless tobacco: Never Used      Comment: stopped at age 74   ??? Alcohol Use: No   ??? Drug Use: No   ??? Sexual Activity: Not on file     Other Topics Concern   ??? Not on file     Social History Narrative     Family History   Problem Relation Age of Onset   ??? Stroke Mother    ??? Heart Disease Mother    ??? Cancer Father    ??? Asthma Father    ??? Lung Disease Father      COPD/smoker   ??? Cancer Brother    ??? Heart  Disease Brother    ??? Alcohol abuse Brother        Review of Systems  All systems reviewed and are negative at this time.    Physical Exam  BP 122/64    Pulse 99    Temp(Src) 98.7 ??F (37.1 ??C)   General appearance - alert, well appearing, and in no distress  Mental status - alert, oriented to person, place, and time  Eyes - extraocular eye movements intact, sclera anicteric  Abdomen - soft, nontender, nondistended, no masses or organomegaly  Stoma pink.  Midline incision well healed.  Lower abdominal laxity without palpable hernia  Neurological -  normal speech, no focal findings or movement disorder noted  Skin - normal coloration and turgor      Assessment/Plan    ICD-9-CM    1. Malignant neoplasm of bladder, part unspecified 161.0 METABOLIC PANEL, BASIC     METABOLIC PANEL, BASIC     CT CHEST ABD PELV W CONT     BMP drawn today.  RTO in 12 mo with CT C/A/P and BMP prior.  Pt instructed to call sooner for problems/concerns.    Dione Booze Genise Strack, DO

## 2013-11-15 LAB — METABOLIC PANEL, BASIC
BUN/Creatinine ratio: 24 (ref 11–26)
BUN: 16 mg/dL (ref 8–27)
CO2: 20 mmol/L (ref 18–29)
Calcium: 8.8 mg/dL (ref 8.7–10.3)
Chloride: 101 mmol/L (ref 97–108)
Creatinine: 0.67 mg/dL (ref 0.57–1.00)
GFR est AA: 104 mL/min/{1.73_m2} (ref >59)
GFR est non-AA: 90 mL/min/{1.73_m2} (ref >59)
Glucose: 100 mg/dL — ABNORMAL HIGH (ref 65–99)
Potassium: 5 mmol/L (ref 3.5–5.2)
Sodium: 140 mmol/L (ref 134–144)

## 2013-11-15 NOTE — Progress Notes (Signed)
Quick Note:    Please let her know that her labs look great  ______

## 2013-11-15 NOTE — Progress Notes (Signed)
Quick Note:    Called and notified the pt of the info below /dh  ______

## 2013-12-16 ENCOUNTER — Encounter: Payer: Self-pay | Admitting: Family Medicine

## 2013-12-16 ENCOUNTER — Ambulatory Visit (INDEPENDENT_AMBULATORY_CARE_PROVIDER_SITE_OTHER): Payer: Medicare PPO | Admitting: Family Medicine

## 2013-12-16 VITALS — BP 128/72 | HR 66 | Temp 97.4°F | Resp 14 | Ht 66.0 in | Wt 183.0 lb

## 2013-12-16 DIAGNOSIS — R0981 Nasal congestion: Secondary | ICD-10-CM

## 2013-12-16 DIAGNOSIS — J3489 Other specified disorders of nose and nasal sinuses: Secondary | ICD-10-CM

## 2013-12-16 DIAGNOSIS — I10 Essential (primary) hypertension: Secondary | ICD-10-CM

## 2013-12-16 DIAGNOSIS — E785 Hyperlipidemia, unspecified: Secondary | ICD-10-CM

## 2013-12-16 LAB — LIPID PANEL
CHOL/HDL RATIO: 2.6 ratio
CHOLESTEROL: 176 mg/dL (ref 0–200)
HDL: 67 mg/dL (ref >39)
LDL Cholesterol: 86 mg/dL (ref 0–99)
TRIGLYCERIDES: 115 mg/dL (ref <150)
VLDL: 23 mg/dL (ref 0–40)

## 2013-12-16 LAB — COMPREHENSIVE METABOLIC PANEL
ALK PHOS: 61 U/L (ref 39–117)
ALT: 18 U/L (ref 0–35)
AST: 19 U/L (ref 0–37)
Albumin: 3.4 g/dL — ABNORMAL LOW (ref 3.5–5.2)
BILIRUBIN TOTAL: 0.4 mg/dL (ref 0.2–1.2)
BUN: 11 mg/dL (ref 6–23)
CO2: 27 meq/L (ref 19–32)
CREATININE: 0.69 mg/dL (ref 0.50–1.10)
Calcium: 8.8 mg/dL (ref 8.4–10.5)
Chloride: 103 mEq/L (ref 96–112)
GLUCOSE: 83 mg/dL (ref 70–99)
Potassium: 3.8 mEq/L (ref 3.5–5.3)
Sodium: 140 mEq/L (ref 135–145)
Total Protein: 6 g/dL (ref 6.0–8.3)

## 2013-12-16 LAB — CBC WITH DIFFERENTIAL/PLATELET
BASOS PCT: 1 % (ref 0–1)
Basophils Absolute: 0.1 10*3/uL (ref 0.0–0.1)
EOS ABS: 0.1 10*3/uL (ref 0.0–0.7)
EOS PCT: 2 % (ref 0–5)
HCT: 39.9 % (ref 36.0–46.0)
Hemoglobin: 14 g/dL (ref 12.0–15.0)
LYMPHS ABS: 2.1 10*3/uL (ref 0.7–4.0)
Lymphocytes Relative: 38 % (ref 12–46)
MCH: 31.1 pg (ref 26.0–34.0)
MCHC: 35.1 g/dL (ref 30.0–36.0)
MCV: 88.7 fL (ref 78.0–100.0)
MONOS PCT: 12 % (ref 3–12)
Monocytes Absolute: 0.6 10*3/uL (ref 0.1–1.0)
Neutro Abs: 2.5 10*3/uL (ref 1.7–7.7)
Neutrophils Relative %: 47 % (ref 43–77)
PLATELETS: 207 10*3/uL (ref 150–400)
RBC: 4.5 MIL/uL (ref 3.87–5.11)
RDW: 14 % (ref 11.5–15.5)
WBC: 5.4 10*3/uL (ref 4.0–10.5)

## 2013-12-16 MED ORDER — SIMVASTATIN 40 MG PO TABS: 40.0000 mg | ORAL_TABLET | Freq: Every day | ORAL | Status: DC

## 2013-12-16 MED ORDER — HYDROCHLOROTHIAZIDE 25 MG PO TABS: 25.0000 mg | ORAL_TABLET | Freq: Every day | ORAL | Status: DC

## 2013-12-16 MED ORDER — PHENYTOIN SODIUM EXTENDED 100 MG PO CAPS: 100.0000 mg | ORAL_CAPSULE | Freq: Three times a day (TID) | ORAL | Status: DC

## 2013-12-16 MED ORDER — ATENOLOL 50 MG PO TABS: ORAL_TABLET | ORAL | Status: DC

## 2013-12-16 NOTE — Assessment & Plan Note (Signed)
Check her lipid panel again. Her Zocor was increased to 40 mg at last visit

## 2013-12-16 NOTE — Patient Instructions (Signed)
Continue current medications Take allergy medication as needed F/U 4 months

## 2013-12-16 NOTE — Progress Notes (Signed)
Patient ID: Molly Patel, female   DOB: Oct 27, 1945, 68 y.o.   MRN: [id number]   Subjective:    Patient ID: Molly Patel, female    DOB: [birth year], 68 y.o.   MRN: [id number]  Patient presents for 4 month F/U  patient here for followup of chronic medical problems. She's history of craniotomy secondary to meningioma. She's has not had any headaches or any seizure activity she still on her Dilantin. Her medications were reviewed. She's due for repeat fasting labs as her cholesterol medication was increased at her last visit to 40 mg. She has no new concerns today.  She does have some mild allergy symptoms and continues to use eye drops for her dry eyes. She's had some nasal congestion but it is not bothering her very much. She does have allergy medicine at home if needed  Review Of Systems:  GEN- denies fatigue, fever, weight loss,weakness, recent illness HEENT- denies eye drainage, change in vision, nasal discharge, CVS- denies chest pain, palpitations RESP- denies SOB, cough, wheeze ABD- denies N/V, change in stools, abd pain GU- denies dysuria, hematuria, dribbling, incontinence MSK- denies joint pain, muscle aches, injury Neuro- denies headache, dizziness, syncope, seizure activity       Objective:    BP 128/72  Pulse 66  Temp(Src) 97.4 F (36.3 C) (Oral)  Resp 14  Ht 5\' 6"  (1.676 m)  Wt 183 lb (83.008 kg)  BMI 29.55 kg/m2 GEN- NAD, alert and oriented x3 HEENT- PERRL, EOMI, non injected sclera, pink conjunctiva, MMM, oropharynx clear, nares clear rhinorrhea Neck- Supple, no LAD CVS- RRR, no murmur RESP-CTAB EXT- Trace pedal edema Pulses- Radial, DP- 2+        Assessment & Plan:      Problem List Items Addressed This Visit   Other and unspecified hyperlipidemia     Check her lipid panel again. Her Zocor was increased to 40 mg at last visit    Relevant Medications      atenolol (TENORMIN) tablet      hydrochlorothiazide tablet      simvastatin (ZOCOR) tablet    Other Relevant Orders      CBC with Differential      Lipid panel   Hypertension - Primary     Blood pressure is well-controlled no change in medication    Relevant Medications      atenolol (TENORMIN) tablet      hydrochlorothiazide tablet      simvastatin (ZOCOR) tablet   Other Relevant Orders      CBC with Differential      Comprehensive metabolic panel      Note: This dictation was prepared with Dragon dictation along with smaller phrase technology. Any transcriptional errors that result from this process are unintentional.

## 2013-12-16 NOTE — Assessment & Plan Note (Signed)
Mild nasal congestion and she does not have a lot of overt symptoms. She does have an over-the-counter antihistamine if needed

## 2013-12-16 NOTE — Assessment & Plan Note (Signed)
Blood pressure is well-controlled no change in medication 

## 2013-12-22 ENCOUNTER — Encounter: Payer: Self-pay | Admitting: *Deleted

## 2014-04-24 ENCOUNTER — Encounter: Payer: Self-pay | Admitting: Family Medicine

## 2014-04-24 ENCOUNTER — Ambulatory Visit (INDEPENDENT_AMBULATORY_CARE_PROVIDER_SITE_OTHER): Payer: Medicare PPO | Admitting: Family Medicine

## 2014-04-24 VITALS — BP 134/78 | HR 76 | Temp 97.2°F | Resp 14 | Ht 65.0 in | Wt 180.0 lb

## 2014-04-24 DIAGNOSIS — Z9889 Other specified postprocedural states: Secondary | ICD-10-CM

## 2014-04-24 DIAGNOSIS — E785 Hyperlipidemia, unspecified: Secondary | ICD-10-CM

## 2014-04-24 DIAGNOSIS — Z9181 History of falling: Secondary | ICD-10-CM

## 2014-04-24 DIAGNOSIS — I10 Essential (primary) hypertension: Secondary | ICD-10-CM

## 2014-04-24 DIAGNOSIS — Z79899 Other long term (current) drug therapy: Secondary | ICD-10-CM

## 2014-04-24 NOTE — Assessment & Plan Note (Signed)
Doing well, no recurrent HA or dizzy spells, on dilantin, check levels today

## 2014-04-24 NOTE — Assessment & Plan Note (Signed)
She was having some mild hypotensive episode though I do not have any documented based on her current blood pressure her age I will have her decrease her hydrochlorothiazide to 12.5 mg she will continue the atenolol.

## 2014-04-24 NOTE — Progress Notes (Signed)
Patient ID: Molly Patel, female   DOB: [birth year], 68 y.o.   MRN: [id number]   Subjective:    Patient ID: Molly Patel, female    DOB: [birth year], 68 y.o.   MRN: [id number]  Patient presents for 4 month F/U and Review Meds    Patient to follow chronic medical problems. She states that she had a few weeks where she felt really low and cannot get out of bed since her blood pressure was always above she rather medication and thought it was her cholesterol medicine therefore she stopped her cholesterol medicine but did not call about her hydrochlorothiazide or her atenolol. She's not had any chest pain or shortness of breath she feels she is at her baseline. She is taking her Dilantin as prescribed status post her craniotomy for meningioma.  She did have a fall a couple months ago. She lives alone her son does check in on her.    Review Of Systems:  GEN- denies fatigue, fever, weight loss,weakness, recent illness HEENT- denies eye drainage, change in vision, nasal discharge, CVS- denies chest pain, palpitations RESP- denies SOB, cough, wheeze ABD- denies N/V, change in stools, abd pain GU- denies dysuria, hematuria, dribbling, incontinence MSK- denies joint pain, muscle aches, injury Neuro- denies headache, dizziness, syncope, seizure activity       Objective:    BP 134/78  Pulse 76  Temp(Src) 97.2 F (36.2 C) (Oral)  Resp 14  Ht 5\' 5"  (1.651 m)  Wt 180 lb (81.647 kg)  BMI 29.95 kg/m2 GEN- NAD, alert and oriented x3 HEENT- PERRL, EOMI, non injected sclera, pink conjunctiva, MMM, oropharynx clear Neck- Supple, no thyromegaly CVS- RRR, no murmur RESP-CTAB EXT- trace pelda edema Pulses- Radial, DP- 2+        Assessment & Plan:      Problem List Items Addressed This Visit   Hypertension - Primary   Relevant Medications      hydrochlorothiazide (HYDRODIURIL) 25 MG tablet   Other Relevant Orders      Comprehensive metabolic panel      Lipid panel    Other Visit  Diagnoses   Long-term use of high-risk medication        Relevant Orders       Phenytoin level, free       Note: This dictation was prepared with Dragon dictation along with smaller phrase technology. Any transcriptional errors that result from this process are unintentional.

## 2014-04-24 NOTE — Assessment & Plan Note (Signed)
Recheck lipid panel she's off of the simvastatin I tried to explain to her that this was not causing the hypotension however she is weary about going back on the medication. She was to go back on red yeast rice

## 2014-04-24 NOTE — Patient Instructions (Addendum)
We will call with lab results Take 1/2 tablet of the hydrochlorothiazide Continue all other medications F/U 4 months

## 2014-04-24 NOTE — Assessment & Plan Note (Signed)
She has already sustained a fall or 2 at home. Discussed using a cane for balance but she declines this and she does agree to have her son come in and put up some support structures around the bathtub and other areas of the home to make it safer.

## 2014-04-25 LAB — COMPREHENSIVE METABOLIC PANEL
ALK PHOS: 61 U/L (ref 39–117)
ALT: 24 U/L (ref 0–35)
AST: 23 U/L (ref 0–37)
Albumin: 3.1 g/dL — ABNORMAL LOW (ref 3.5–5.2)
BILIRUBIN TOTAL: 0.3 mg/dL (ref 0.2–1.2)
BUN: 9 mg/dL (ref 6–23)
CALCIUM: 8.5 mg/dL (ref 8.4–10.5)
CO2: 27 mEq/L (ref 19–32)
CREATININE: 0.7 mg/dL (ref 0.50–1.10)
Chloride: 104 mEq/L (ref 96–112)
Glucose, Bld: 82 mg/dL (ref 70–99)
Potassium: 4.3 mEq/L (ref 3.5–5.3)
Sodium: 139 mEq/L (ref 135–145)
Total Protein: 5.5 g/dL — ABNORMAL LOW (ref 6.0–8.3)

## 2014-04-25 LAB — LIPID PANEL
CHOL/HDL RATIO: 3.8 ratio
Cholesterol: 209 mg/dL — ABNORMAL HIGH (ref 0–200)
HDL: 55 mg/dL (ref >39)
LDL CALC: 95 mg/dL (ref 0–99)
Triglycerides: 295 mg/dL — ABNORMAL HIGH (ref <150)
VLDL: 59 mg/dL — AB (ref 0–40)

## 2014-04-30 LAB — PHENYTOIN LEVEL, FREE AND TOTAL: Phenytoin, Free: <0.5 mg/L — ABNORMAL LOW (ref 1.0–2.0)

## 2014-05-06 ENCOUNTER — Encounter: Payer: Self-pay | Admitting: *Deleted

## 2014-05-15 NOTE — Telephone Encounter (Signed)
Pts daughter is calling and states that the pt needs ring barriers for her ostomy bag. Pt needs the barriers sent to [employer], the pt is in [place] visiting. Please call pts daughter.

## 2014-05-15 NOTE — Telephone Encounter (Signed)
Tried to call back but no answer or vm.

## 2014-05-15 NOTE — Telephone Encounter (Signed)
Waiting for fax

## 2014-05-15 NOTE — Telephone Encounter (Signed)
Zilla Scarola's daughter, Ishmael Holter called and said  She was checking on the ring bearers for her mothers ostomy bags. Please call back. CB#[phone]. I did explain the nurses are with patients and she will get a call when the nurse has a chance. Thanks/dg

## 2014-05-16 NOTE — Telephone Encounter (Signed)
Spoke with April and faxed info to [phone].  She will take care of this.

## 2014-05-16 NOTE — Telephone Encounter (Signed)
Per [facility] pt need referral for these supplies.  I called [facility] [phone] and spoke with michael.  He put in the info and gave reference #[id number].  He states the PCP needs to be the one to make this referral.  I called PCP, [facility] @[phone] and LM for April.  She takes care of referral.  Waiting for her call to get this taken care of as the pt is almost out of supplies.

## 2014-05-17 NOTE — Telephone Encounter (Signed)
Spoke with Cathy Jackson and Kyung Rudd states they will send order out.  I asked to update me as well.  She will do so.

## 2014-05-17 NOTE — Telephone Encounter (Signed)
[employer] rep called and was very nasty.  He said he has been trying to get this order sent out since 05/02/14.  I did not receive an order until I call them for one.  It took then a day to send that order.  I let him know I have faxed this info to PCP because [employer] need a referral and would not let me do this.  Referral has to come from PCP not urology.  He asked for [employer] number and I gave this to him.  He said someone told him this would be taken care of yesterday.  I did not speak with [employer] yesterday and did not receive a message from call room.

## 2014-05-17 NOTE — Telephone Encounter (Signed)
Pts daughter is calling and states that they did not get the order for the cath supplies and the pt is running out. Please refax and call daughter.

## 2014-08-22 ENCOUNTER — Ambulatory Visit (INDEPENDENT_AMBULATORY_CARE_PROVIDER_SITE_OTHER): Payer: Medicare PPO | Admitting: Family Medicine

## 2014-08-22 ENCOUNTER — Encounter: Payer: Self-pay | Admitting: Family Medicine

## 2014-08-22 VITALS — BP 130/72 | HR 62 | Temp 97.8°F | Resp 14 | Ht 65.0 in | Wt 180.0 lb

## 2014-08-22 DIAGNOSIS — I10 Essential (primary) hypertension: Secondary | ICD-10-CM

## 2014-08-22 DIAGNOSIS — Z79899 Other long term (current) drug therapy: Secondary | ICD-10-CM

## 2014-08-22 DIAGNOSIS — Z9889 Other specified postprocedural states: Secondary | ICD-10-CM

## 2014-08-22 DIAGNOSIS — E785 Hyperlipidemia, unspecified: Secondary | ICD-10-CM

## 2014-08-22 LAB — CBC WITH DIFFERENTIAL/PLATELET
Basophils Absolute: 0 10*3/uL (ref 0.0–0.1)
Basophils Relative: 1 % (ref 0–1)
EOS ABS: 0.1 10*3/uL (ref 0.0–0.7)
Eosinophils Relative: 2 % (ref 0–5)
HCT: 43.5 % (ref 36.0–46.0)
HEMOGLOBIN: 14.5 g/dL (ref 12.0–15.0)
Lymphocytes Relative: 35 % (ref 12–46)
Lymphs Abs: 1.6 10*3/uL (ref 0.7–4.0)
MCH: 29.7 pg (ref 26.0–34.0)
MCHC: 33.3 g/dL (ref 30.0–36.0)
MCV: 89 fL (ref 78.0–100.0)
MPV: 10.8 fL (ref 8.6–12.4)
Monocytes Absolute: 0.5 10*3/uL (ref 0.1–1.0)
Monocytes Relative: 11 % (ref 3–12)
NEUTROS PCT: 51 % (ref 43–77)
Neutro Abs: 2.3 10*3/uL (ref 1.7–7.7)
Platelets: 210 10*3/uL (ref 150–400)
RBC: 4.89 MIL/uL (ref 3.87–5.11)
RDW: 14 % (ref 11.5–15.5)
WBC: 4.6 10*3/uL (ref 4.0–10.5)

## 2014-08-22 LAB — LIPID PANEL
Cholesterol: 288 mg/dL — ABNORMAL HIGH (ref 0–200)
HDL: 68 mg/dL (ref >39)
LDL Cholesterol: 161 mg/dL — ABNORMAL HIGH (ref 0–99)
TRIGLYCERIDES: 296 mg/dL — AB (ref <150)
Total CHOL/HDL Ratio: 4.2 Ratio
VLDL: 59 mg/dL — ABNORMAL HIGH (ref 0–40)

## 2014-08-22 LAB — COMPREHENSIVE METABOLIC PANEL
ALT: 17 U/L (ref 0–35)
AST: 23 U/L (ref 0–37)
Albumin: 3.2 g/dL — ABNORMAL LOW (ref 3.5–5.2)
Alkaline Phosphatase: 77 U/L (ref 39–117)
BILIRUBIN TOTAL: 0.4 mg/dL (ref 0.2–1.2)
BUN: 8 mg/dL (ref 6–23)
CALCIUM: 8.9 mg/dL (ref 8.4–10.5)
CHLORIDE: 103 meq/L (ref 96–112)
CO2: 23 meq/L (ref 19–32)
CREATININE: 0.81 mg/dL (ref 0.50–1.10)
GLUCOSE: 80 mg/dL (ref 70–99)
Potassium: 4.8 mEq/L (ref 3.5–5.3)
Sodium: 139 mEq/L (ref 135–145)
Total Protein: 6 g/dL (ref 6.0–8.3)

## 2014-08-22 LAB — PHENYTOIN LEVEL, TOTAL

## 2014-08-22 MED ORDER — ATENOLOL 50 MG PO TABS: ORAL_TABLET | ORAL | Status: DC

## 2014-08-22 MED ORDER — HYDROCHLOROTHIAZIDE 25 MG PO TABS: 12.5000 mg | ORAL_TABLET | Freq: Every day | ORAL | Status: DC

## 2014-08-22 MED ORDER — PHENYTOIN SODIUM EXTENDED 100 MG PO CAPS: 100.0000 mg | ORAL_CAPSULE | Freq: Three times a day (TID) | ORAL | Status: DC

## 2014-08-22 NOTE — Addendum Note (Signed)
Addended by: Sheral Flow on: 08/22/2014 11:23 AM   Modules accepted: Orders

## 2014-08-22 NOTE — Patient Instructions (Addendum)
Continue current medications  Take the atenonol Take 1/2 tablet of HCTZ unless you are swelling Continue Dilantin We will call in labs results  F/U 4 months

## 2014-08-22 NOTE — Assessment & Plan Note (Signed)
Recheck lipids

## 2014-08-22 NOTE — Progress Notes (Signed)
Patient ID: Molly Patel, female   DOB: [birth year], 68 y.o.   MRN: [id number]   Subjective:    Patient ID: Molly Patel, female    DOB: [birth year], 68 y.o.   MRN: [id number]  Patient presents for 4 month F/U  patient here to follow-up chronic medical problems. She is no specific concerns today. She did increase her hydrochlorothiazide back to one full tablet once a day after she has some swelling of her ankles. It is not quite clear she was taking her atenolol the entire time it seems that she went off of it and then went back on it. She has been taking her Dilantin now as prescribed 3 times a day her previous check her level was low she's currently on this for seizure prophylaxis status post craniotomy. She has been taking red yeast rice as well for her cholesterol    Review Of Systems:  GEN- denies fatigue, fever, weight loss,weakness, recent illness HEENT- denies eye drainage, change in vision, nasal discharge, CVS- denies chest pain, palpitations RESP- denies SOB, cough, wheeze ABD- denies N/V, change in stools, abd pain GU- denies dysuria, hematuria, dribbling, incontinence MSK- denies joint pain, muscle aches, injury Neuro- denies headache, dizziness, syncope, seizure activity       Objective:    BP 130/72 mmHg  Pulse 62  Temp(Src) 97.8 F (36.6 C) (Oral)  Resp 14  Ht 5\' 5"  (1.651 m)  Wt 180 lb (81.647 kg)  BMI 29.95 kg/m2 GEN- NAD, alert and oriented x3 HEENT- PERRL, EOMI, non injected sclera, pink conjunctiva, MMM, oropharynx clear CVS- RRR, no murmur RESP-CTAB Neuro-CNII-XII in tact, slow gait, a little unsteady ( no cane today) EXT- No edema Pulses- Radial 2+        Assessment & Plan:      Problem List Items Addressed This Visit      Unprioritized   S/P craniotomy   Relevant Orders      Phenytoin level, total and free   Hypertension - Primary   Relevant Medications      atenolol (TENORMIN) tablet      hydrochlorothiazide tablet   Other Relevant  Orders      CBC with Differential      Comprehensive metabolic panel   Hyperlipidemia   Relevant Medications      atenolol (TENORMIN) tablet      hydrochlorothiazide tablet   Other Relevant Orders      Lipid panel    Other Visit Diagnoses    Long-term use of high-risk medication        Relevant Orders       Phenytoin level, total and free       Note: This dictation was prepared with Dragon dictation along with smaller phrase technology. Any transcriptional errors that result from this process are unintentional.

## 2014-08-22 NOTE — Assessment & Plan Note (Signed)
Blood pressure looks good today. I will have her continue atenolol as well as half a tablet of hydrochlorothiazide she can increase to a whole tablet if she has swelling but when she was maintained on the higher dose she began having hypotensive episodes

## 2014-08-22 NOTE — Assessment & Plan Note (Signed)
Continue dilantin for seizure prophylaxis. She is now taking on a regular basis as prescribed

## 2014-08-24 ENCOUNTER — Other Ambulatory Visit: Payer: Self-pay | Admitting: *Deleted

## 2014-08-24 MED ORDER — ATORVASTATIN CALCIUM 20 MG PO TABS: 20.0000 mg | ORAL_TABLET | Freq: Every day | ORAL | Status: DC

## 2014-08-25 DIAGNOSIS — I639 Cerebral infarction, unspecified: Secondary | ICD-10-CM

## 2014-08-25 HISTORY — DX: Cerebral infarction, unspecified: I63.9

## 2014-09-08 ENCOUNTER — Telehealth: Payer: Self-pay | Admitting: *Deleted

## 2014-09-08 DIAGNOSIS — R195 Other fecal abnormalities: Secondary | ICD-10-CM

## 2014-09-08 NOTE — Telephone Encounter (Signed)
Call placed to patient. LMTRC.  

## 2014-09-08 NOTE — Telephone Encounter (Signed)
-----   Message from Alycia Rossetti, MD sent at 09/06/2014  5:34 PM EST ----- Regarding: Labs  Call pt, I received a home FOBT, it was positive, this could be result of bleeding polyps, I would recommend colonoscopy, If she agrees okay to refer in [place] to GI

## 2014-09-08 NOTE — Telephone Encounter (Signed)
Patient returned call and made aware.   Agreed to GI referral.   Referral order placed.

## 2014-09-13 ENCOUNTER — Encounter (INDEPENDENT_AMBULATORY_CARE_PROVIDER_SITE_OTHER): Payer: Self-pay | Admitting: *Deleted

## 2014-10-12 ENCOUNTER — Ambulatory Visit (INDEPENDENT_AMBULATORY_CARE_PROVIDER_SITE_OTHER): Payer: Medicare PPO | Admitting: Internal Medicine

## 2014-10-12 ENCOUNTER — Telehealth (INDEPENDENT_AMBULATORY_CARE_PROVIDER_SITE_OTHER): Payer: Self-pay | Admitting: *Deleted

## 2014-10-12 ENCOUNTER — Encounter (INDEPENDENT_AMBULATORY_CARE_PROVIDER_SITE_OTHER): Payer: Self-pay | Admitting: Internal Medicine

## 2014-10-12 ENCOUNTER — Other Ambulatory Visit (INDEPENDENT_AMBULATORY_CARE_PROVIDER_SITE_OTHER): Payer: Self-pay | Admitting: *Deleted

## 2014-10-12 VITALS — BP 132/64 | HR 60 | Temp 97.5°F | Ht 67.0 in | Wt 183.5 lb

## 2014-10-12 DIAGNOSIS — R1310 Dysphagia, unspecified: Secondary | ICD-10-CM

## 2014-10-12 DIAGNOSIS — R195 Other fecal abnormalities: Secondary | ICD-10-CM

## 2014-10-12 DIAGNOSIS — Z1211 Encounter for screening for malignant neoplasm of colon: Secondary | ICD-10-CM

## 2014-10-12 NOTE — Patient Instructions (Addendum)
Colonoscopy/EGD/ED. The risks and benefits such as perforation, bleeding, and infection were reviewed with the patient and is agreeable.

## 2014-10-12 NOTE — Telephone Encounter (Signed)
Patient needs trilyte 

## 2014-10-12 NOTE — Progress Notes (Signed)
Subjective:    Patient ID: Molly Patel, female    DOB: 1946/08/08, 69 y.o.   MRN: [id number]  HPI Referred to our office by Dr. Buelah Manis for positive stool card. Found on routine exam at Dr. Dorian Heckle office. Patient denies seeing any blood.  Appetite is good. No weight loss. Occasionally has dysphagia. Dysphagia occurs about every 2 weeks.  She denies any acid reflux. She usually has a BM every day. No melena or BRRB. She denies any abdominal pain. She has never undergone a colonoscopy.  No family hx of colon cancer.     CBC    Component Value Date/Time   WBC 4.6 08/22/2014 0955   RBC 4.89 08/22/2014 0955   HGB 14.5 08/22/2014 0955   HCT 43.5 08/22/2014 0955   PLT 210 08/22/2014 0955   MCV 89.0 08/22/2014 0955   MCH 29.7 08/22/2014 0955   MCHC 33.3 08/22/2014 0955   RDW 14.0 08/22/2014 0955   LYMPHSABS 1.6 08/22/2014 0955   MONOABS 0.5 08/22/2014 0955   EOSABS 0.1 08/22/2014 0955   BASOSABS 0.0 08/22/2014 0955      Review of Systems Divorced. Two children in good health. 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 Past Medical History  Diagnosis Date  . Incontinence   . Hyperlipidemia   . Insomnia   . Syncope   . Angioedema   . Echocardiogram with ECG monitoring july 2012    mild LVH, normal event monitor   . Ovarian cyst   . Vitamin D deficiency   . Hypertension     takes meds daily  . Depression   . Seizures     2 weeks ago  . Dizziness     Past Surgical History  Procedure Laterality Date  . Vesicovaginal fistula closure w/ tah    . Abdominal hysterectomy    . Craniotomy  06/29/2012    Procedure: CRANIOTOMY TUMOR EXCISION;  Surgeon: Faythe Ghee, MD;  Location: [place] [facility];  Service: Neurosurgery;  Laterality: Left;  Left frontal Craniotomy for Meningioma    No Known Allergies  Current Outpatient Prescriptions on File Prior to Visit  Medication Sig Dispense Refill  . atenolol (TENORMIN) 50 MG tablet TAKE 1 TABLET EVERY DAY 90 tablet 1  .  hydrochlorothiazide (HYDRODIURIL) 25 MG tablet Take 0.5 tablets (12.5 mg total) by mouth daily. 90 tablet 1  . phenytoin (DILANTIN) 100 MG ER capsule Take 1 capsule (100 mg total) by mouth 3 (three) times daily. 270 capsule 1  . Red Yeast Rice Extract (RED YEAST RICE PO) Take 1 capsule by mouth 2 (two) times daily.     Marland Kitchen VITAMIN E PO Take 1 capsule by mouth daily.     No current facility-administered medications on file prior to visit.          Objective:   Physical Exam  Filed Vitals:   10/12/14 1509  Height: 5\' 7"  (1.702 m)  Weight: 183 lb 8 oz (83.235 kg)   Alert and oriented. Skin warm and dry. Oral mucosa is moist.   . Sclera anicteric, conjunctivae is pink. Thyroid not enlarged. No cervical lymphadenopathy. Lungs clear. Heart regular rate and rhythm.  Abdomen is soft. Bowel sounds are positive. No hepatomegaly. No abdominal masses felt. No tenderness.  No edema to lower extremities.  Stool brown and guaiac negative.     Developer 3016 Ex 12/2014 Card Lot 02-14, Ex 7/18       Assessment & Plan:  Guaiac positive stool by  Dr. Buelah Manis. Colonic neoplasm needs to be ruled out.  Solid food dysphagia.  No prior colonoscopy. Colonoscopy/EGD/ED The risks and benefits such as perforation, bleeding, and infection were reviewed with the patient and is agreeable.

## 2014-10-13 MED ORDER — PEG 3350-KCL-NA BICARB-NACL 420 G PO SOLR: 4000.0000 mL | Freq: Once | ORAL | Status: DC

## 2014-11-02 ENCOUNTER — Ambulatory Visit (HOSPITAL_COMMUNITY)
Admission: RE | Admit: 2014-11-02 | Discharge: 2014-11-02 | Disposition: A | Discharge status: Home / Self Care | Payer: Medicare PPO | Source: Ambulatory Visit | Attending: Internal Medicine | Admitting: Internal Medicine

## 2014-11-02 ENCOUNTER — Encounter (HOSPITAL_COMMUNITY): Payer: Self-pay | Admitting: *Deleted

## 2014-11-02 ENCOUNTER — Encounter (HOSPITAL_COMMUNITY)
Admission: RE | Disposition: A | Discharge status: Home / Self Care | Payer: Self-pay | Source: Ambulatory Visit | Attending: Internal Medicine

## 2014-11-02 DIAGNOSIS — D123 Benign neoplasm of transverse colon: Secondary | ICD-10-CM | POA: Diagnosis not present

## 2014-11-02 DIAGNOSIS — R195 Other fecal abnormalities: Secondary | ICD-10-CM

## 2014-11-02 DIAGNOSIS — F329 Major depressive disorder, single episode, unspecified: Secondary | ICD-10-CM | POA: Insufficient documentation

## 2014-11-02 DIAGNOSIS — D125 Benign neoplasm of sigmoid colon: Secondary | ICD-10-CM | POA: Diagnosis not present

## 2014-11-02 DIAGNOSIS — G4700 Insomnia, unspecified: Secondary | ICD-10-CM | POA: Diagnosis not present

## 2014-11-02 DIAGNOSIS — Z87891 Personal history of nicotine dependence: Secondary | ICD-10-CM | POA: Diagnosis not present

## 2014-11-02 DIAGNOSIS — K921 Melena: Secondary | ICD-10-CM | POA: Diagnosis not present

## 2014-11-02 DIAGNOSIS — Z90710 Acquired absence of both cervix and uterus: Secondary | ICD-10-CM | POA: Insufficient documentation

## 2014-11-02 DIAGNOSIS — Z7982 Long term (current) use of aspirin: Secondary | ICD-10-CM | POA: Insufficient documentation

## 2014-11-02 DIAGNOSIS — I10 Essential (primary) hypertension: Secondary | ICD-10-CM | POA: Diagnosis not present

## 2014-11-02 DIAGNOSIS — R1310 Dysphagia, unspecified: Secondary | ICD-10-CM | POA: Diagnosis not present

## 2014-11-02 DIAGNOSIS — E785 Hyperlipidemia, unspecified: Secondary | ICD-10-CM | POA: Insufficient documentation

## 2014-11-02 DIAGNOSIS — Z79899 Other long term (current) drug therapy: Secondary | ICD-10-CM | POA: Diagnosis not present

## 2014-11-02 DIAGNOSIS — D122 Benign neoplasm of ascending colon: Secondary | ICD-10-CM | POA: Diagnosis not present

## 2014-11-02 DIAGNOSIS — K2970 Gastritis, unspecified, without bleeding: Secondary | ICD-10-CM | POA: Insufficient documentation

## 2014-11-02 DIAGNOSIS — K259 Gastric ulcer, unspecified as acute or chronic, without hemorrhage or perforation: Secondary | ICD-10-CM | POA: Diagnosis not present

## 2014-11-02 DIAGNOSIS — Z800 Family history of malignant neoplasm of digestive organs: Secondary | ICD-10-CM | POA: Diagnosis not present

## 2014-11-02 HISTORY — PX: COLONOSCOPY: SHX5424

## 2014-11-02 HISTORY — PX: ESOPHAGEAL DILATION: SHX303

## 2014-11-02 HISTORY — PX: ESOPHAGOGASTRODUODENOSCOPY: SHX5428

## 2014-11-02 LAB — CLOTEST (H. PYLORI), BIOPSY: Helicobacter screen: POSITIVE — AB

## 2014-11-02 SURGERY — COLONOSCOPY
Anesthesia: Moderate Sedation

## 2014-11-02 MED ORDER — STERILE WATER FOR IRRIGATION IR SOLN
Status: DC | PRN
  Administered 2014-11-02: 13:00:00

## 2014-11-02 MED ORDER — MIDAZOLAM HCL 5 MG/5ML IJ SOLN
INTRAMUSCULAR | Status: DC | PRN
  Administered 2014-11-02: 1 mg via INTRAVENOUS
  Administered 2014-11-02: 2 mg via INTRAVENOUS
  Administered 2014-11-02 (×2): 1 mg via INTRAVENOUS
  Administered 2014-11-02: 2 mg via INTRAVENOUS

## 2014-11-02 MED ORDER — MEPERIDINE HCL 50 MG/ML IJ SOLN
INTRAMUSCULAR | Status: DC | PRN
  Administered 2014-11-02 (×2): 25 mg via INTRAVENOUS

## 2014-11-02 MED ORDER — SODIUM CHLORIDE 0.9 % IV SOLN
INTRAVENOUS | Status: DC
  Administered 2014-11-02: 1000 mL via INTRAVENOUS

## 2014-11-02 MED ORDER — MIDAZOLAM HCL 5 MG/5ML IJ SOLN
INTRAMUSCULAR | Status: DC
Start: 2014-11-02 — End: 2014-11-02
  Filled 2014-11-02: qty 10

## 2014-11-02 MED ORDER — MEPERIDINE HCL 50 MG/ML IJ SOLN
INTRAMUSCULAR | Status: AC
  Filled 2014-11-02: qty 1

## 2014-11-02 MED ORDER — BUTAMBEN-TETRACAINE-BENZOCAINE 2-2-14 % EX AERO
INHALATION_SPRAY | CUTANEOUS | Status: DC | PRN
  Administered 2014-11-02: 2 via TOPICAL

## 2014-11-02 NOTE — Op Note (Signed)
EGD ED AND COLONOSCOPY  PROCEDURE REPORT  PATIENT:  Molly Patel  MR#:  [id number] Birthdate:  August 24, 1946, 69 y.o., female Endoscopist:  Dr. Rogene Houston, MD Referred By:  Dr.  Vic Blackbird, MD Procedure Date: 11/02/2014  Procedure:   EGD, ED & Colonoscopy  Indications:  Patient is 69 year old  African-American female who presents with 3-4 month history of intermittent solid food dysphagia. She points to her sternum area site of bolus obstruction. She denies heartburn nausea vomiting epigastric pain or melena. She is also undergoing diagnostic colonoscopy. She was noted having positive stool by Dr. Buelah Manis. Family history significant for [facility] and brother who was in his mid 30s at the time of diagnosis but patient has never undergoing screening.            Informed Consent:  The risks, benefits, alternatives & imponderables which include, but are not limited to, bleeding, infection, perforation, drug reaction and potential missed lesion have been reviewed.  The potential for biopsy, lesion removal, esophageal dilation, etc. have also been discussed.  Questions have been answered.  All parties agreeable.  Please see history & physical in medical record for more information.  Medications:  Demerol 50 mg IV Versed 7 mg IV Cetacaine spray topically for oropharyngeal anesthesia  EGD  Description of procedure:  The endoscope was introduced through the mouth and advanced to the second portion of the duodenum without difficulty or limitations. The mucosal surfaces were surveyed very carefully during advancement of the scope and upon withdrawal.  Findings:  Esophagus:  Mucosa of the esophagus was normal. No ring or stricture identified. GEJ:  40 cm Stomach:   Stomach was empty and distended very well with insufflation. Folds in the proximal stomach were normal. Examination of mucosa at body was normal. There were 2 prepyloric scars along with erythema and erosions. Pyloric channel was patent.  Angularis fundus and cardia were unremarkable. Duodenum:   Normal bulbar and post bulbar mucosa.  Therapeutic/Diagnostic Maneuvers Performed:   Esophagus was dilated by passing 54 and 56 [place] Maloney dilators to full insertion. Endoscope was passed again and no mucosal disruption noted to esophagus. Antral biopsy was taken for CLOtest inpatient okay for procedure #2.  COLONOSCOPY Description of procedure:  After a digital rectal exam was performed, that colonoscope was advanced from the anus through the rectum and colon to the area of the cecum, ileocecal valve and appendiceal orifice. The cecum was deeply intubated. These structures were well-seen and photographed for the record. From the level of the cecum and ileocecal valve, the scope was slowly and cautiously withdrawn. The mucosal surfaces were carefully surveyed utilizing scope tip to flexion to facilitate fold flattening as needed. The scope was pulled down into the rectum where a thorough exam including retroflexion was performed.  Findings:   Prep excellent. Threel polyps ablated via cold biopsy and submitted together. These are located at ascending colon, hepatic flexure and mid sigmoid colon.  Normal mucosa of rectum and aorectal junction.   Therapeutic/Diagnostic Maneuvers Performed:   See above  Complications:   none  Cecal Withdrawal Time:  9 minutes  Impression:   EGD findings; No evidence of  Schatzki's ring or esophageal stricture. Two antral scars and prepyloric gastritis. Antral biopsy taken for CLOtest.  Esophagus dilated by passing 54 and 56 [place] Maloney dilators but no mucosal disruption induced.  Colonoscopy findings; Examination performed to cecum. Threel polyps ablated via cold biopsy and submitted together.  Recommendations:  Standard istructions given.  I will  be contacting patient with biopsy and CLOtest results and further recommendations.  Loyd Marhefka U  11/02/2014 1:46 PM  CC: Dr. Vic Blackbird, MD & Dr. Rayne Du ref. provider found

## 2014-11-02 NOTE — Discharge Instructions (Signed)
Resume usual medications and diet. No driving for 24 hours. Physician will call with biopsy results   Esophagogastroduodenoscopy Care After Refer to this sheet in the next few weeks. These instructions provide you with information on caring for yourself after your procedure. Your caregiver may also give you more specific instructions. Your treatment has been planned according to current medical practices, but problems sometimes occur. Call your caregiver if you have any problems or questions after your procedure.  HOME CARE INSTRUCTIONS  Do not eat or drink anything until the numbing medicine (local anesthetic) has worn off and your gag reflex has returned. You will know that the local anesthetic has worn off when you can swallow comfortably.  Do not drive for 12 hours after the procedure or as directed by your caregiver.  Only take medicines as directed by your caregiver. SEEK MEDICAL CARE IF:   You cannot stop coughing.  You are not urinating at all or less than usual. SEEK IMMEDIATE MEDICAL CARE IF:  You have difficulty swallowing.  You cannot eat or drink.  You have worsening throat or chest pain.  You have dizziness, lightheadedness, or you faint.  You have nausea or vomiting.  You have chills.  You have a fever.  You have severe abdominal pain.  You have black, tarry, or bloody stools. Document Released: 07/28/2012 Document Reviewed: 07/28/2012 [facility] Patient Information 2015 [place]. This information is not intended to replace advice given to you by your health care provider. Make sure you discuss any questions you have with your health care provider.   Colonoscopy, Care After These instructions give you information on caring for yourself after your procedure. Your doctor may also give you more specific instructions. Call your doctor if you have any problems or questions after your procedure. HOME CARE  Do not drive for 24 hours.  Do not sign  important papers or use machinery for 24 hours.  You may shower.  You may go back to your usual activities, but go slower for the first 24 hours.  Take rest breaks often during the first 24 hours.  Walk around or use warm packs on your belly (abdomen) if you have belly cramping or gas.  Drink enough fluids to keep your pee (urine) clear or pale yellow.  Resume your normal diet. Avoid heavy or fried foods.  Avoid drinking alcohol for 24 hours or as told by your doctor.  Only take medicines as told by your doctor. If a tissue sample (biopsy) was taken during the procedure:   Do not take aspirin or blood thinners for 7 days, or as told by your doctor.  Do not drink alcohol for 7 days, or as told by your doctor.  Eat soft foods for the first 24 hours. GET HELP IF: You still have a small amount of blood in your poop (stool) 2-3 days after the procedure. GET HELP RIGHT AWAY IF:  You have more than a small amount of blood in your poop.  You see clumps of tissue (blood clots) in your poop.  Your belly is puffy (swollen).  You feel sick to your stomach (nauseous) or throw up (vomit).  You have a fever.  You have belly pain that gets worse and medicine does not help. MAKE SURE YOU:  Understand these instructions.  Will watch your condition.  Will get help right away if you are not doing well or get worse. Document Released: 09/13/2010 Document Revised: 08/16/2013 Document Reviewed: 04/18/2013 [facility] Patient Information 2015 [place]. This information is not intended to replace advice given to you by your health care provider. Make sure you discuss any questions you have with your health care provider.   Colon Polyps Polyps are lumps of extra tissue growing inside the body. Polyps can grow in the large intestine (colon). Most colon polyps are noncancerous (benign). However, some colon polyps can become cancerous over time. Polyps that are larger than a pea may be  harmful. To be safe, caregivers remove and test all polyps. CAUSES  Polyps form when mutations in the genes cause your cells to grow and divide even though no more tissue is needed. RISK FACTORS There are a number of risk factors that can increase your chances of getting colon polyps. They include:  Being older than 50 years.  Family history of colon polyps or colon cancer.  Long-term colon diseases, such as colitis or Crohn disease.  Being overweight.  Smoking.  Being inactive.  Drinking too much alcohol. SYMPTOMS  Most small polyps do not cause symptoms. If symptoms are present, they may include:  Blood in the stool. The stool may look dark red or black.  Constipation or diarrhea that lasts longer than 1 week. DIAGNOSIS People often do not know they have polyps until their caregiver finds them during a regular checkup. Your caregiver can use 4 tests to check for polyps:  Digital rectal exam. The caregiver wears gloves and feels inside the rectum. This test would find polyps only in the rectum.  Barium enema. The caregiver puts a liquid called barium into your rectum before taking X-rays of your colon. Barium makes your colon look white. Polyps are dark, so they are easy to see in the X-ray pictures.  Sigmoidoscopy. A thin, flexible tube (sigmoidoscope) is placed into your rectum. The sigmoidoscope has a light and tiny camera in it. The caregiver uses the sigmoidoscope to look at the last third of your colon.  Colonoscopy. This test is like sigmoidoscopy, but the caregiver looks at the entire colon. This is the most common method for finding and removing polyps. TREATMENT  Any polyps will be removed during a sigmoidoscopy or colonoscopy. The polyps are then tested for cancer. PREVENTION  To help lower your risk of getting more colon polyps:  Eat plenty of fruits and vegetables. Avoid eating fatty foods.  Do not smoke.  Avoid drinking alcohol.  Exercise every  day.  Lose weight if recommended by your caregiver.  Eat plenty of calcium and folate. Foods that are rich in calcium include milk, cheese, and broccoli. Foods that are rich in folate include chickpeas, kidney beans, and spinach. HOME CARE INSTRUCTIONS Keep all follow-up appointments as directed by your caregiver. You may need periodic exams to check for polyps. SEEK MEDICAL CARE IF: You notice bleeding during a bowel movement. Document Released: 05/07/2004 Document Revised: 11/03/2011 Document Reviewed: 10/21/2011 [facility] Patient Information 2015 [place], [place]. This information is not intended to replace advice given to you by your health care provider. Make sure you discuss any questions you have with your health care provider.

## 2014-11-02 NOTE — H&P (Signed)
Molly Patel is an 69 y.o. female.   Chief Complaint: Patient's here for EGD, ED and colonoscopy, HPI: Patient is 69 year old African-American female who was recently noted to have heme-positive stool. She denies abdominal pain melena or rectal bleeding she does not take OTC NSAIDs. She had CBC in her hemoglobin was over 14 g. She also complains of intermittent solid food dysphagia which started 3-4 months ago. She points or sternal areas site of bolus obstruction. At times she has difficulty swallowing water. She denies heartburn nausea vomiting anorexia or weight loss. Patient has never been screened for CRC. Family history is significant for colon carcinoma in her brother who was around 1 at the time of diagnosis and died in his late 12s.  Past Medical History  Diagnosis Date  . Incontinence   . Hyperlipidemia   . Insomnia   . Syncope   . Angioedema   . Echocardiogram with ECG monitoring july 2012    mild LVH, normal event monitor   . Ovarian cyst   . Vitamin D deficiency   . Hypertension     takes meds daily  . Depression   . Seizures     2 weeks ago  . Dizziness     Past Surgical History  Procedure Laterality Date  . Vesicovaginal fistula closure w/ tah    . Abdominal hysterectomy    . Craniotomy  06/29/2012    Procedure: CRANIOTOMY TUMOR EXCISION;  Surgeon: Faythe Ghee, MD;  Location: [facility];  Service: Neurosurgery;  Laterality: Left;  Left frontal Craniotomy for Meningioma    Family History  Problem Relation Age of Onset  . Asthma Grandchild   . Arthritis Sister   . Diabetes Sister   . Colon cancer Brother   . Stroke Father    Social History:  reports that she quit smoking about 29 years ago. Her smoking use included Cigarettes. She quit after 7 years of use. She has never used smokeless tobacco. She reports that she does not drink alcohol or use illicit drugs.  Allergies: No Known Allergies  Medications Prior to Admission  Medication Sig Dispense  Refill  . aspirin 81 MG tablet Take 81 mg by mouth daily.    Marland Kitchen atenolol (TENORMIN) 50 MG tablet TAKE 1 TABLET EVERY DAY 90 tablet 1  . Glucos-Chondroit-Collag-Hyal (GLUCOSAMINE CHONDROIT-COLLAGEN PO) Take 1 tablet by mouth daily.     . hydrochlorothiazide (HYDRODIURIL) 25 MG tablet Take 0.5 tablets (12.5 mg total) by mouth daily. (Patient taking differently: Take 12.5-25 mg by mouth daily. ) 90 tablet 1  . phenytoin (DILANTIN) 100 MG ER capsule Take 1 capsule (100 mg total) by mouth 3 (three) times daily. 270 capsule 1  . polyethylene glycol-electrolytes (NULYTELY/GOLYTELY) 420 G solution Take 4,000 mLs by mouth once. 4000 mL 0  . Red Yeast Rice Extract (RED YEAST RICE PO) Take 1 capsule by mouth 2 (two) times daily.     . Vitamin D, Cholecalciferol, 1000 UNITS CAPS Take 1 capsule by mouth daily.      No results found for this or any previous visit (from the past 48 hour(s)). No results found.  ROS  Blood pressure 145/71, pulse 57, temperature 97.9 F (36.6 C), temperature source Oral, resp. rate 18, height 5\' 7"  (1.702 m), weight 183 lb (83.008 kg), SpO2 99 %. Physical Exam  Constitutional: She appears well-developed and well-nourished.  HENT:  Mouth/Throat: Oropharynx is clear and moist.  Eyes: Conjunctivae are normal. No scleral icterus.  Neck: No thyromegaly  present.  Cardiovascular: Normal rate, regular rhythm and normal heart sounds.   No murmur heard. Respiratory: Effort normal and breath sounds normal.  GI: Soft. She exhibits no distension and no mass. There is no tenderness.  Musculoskeletal: She exhibits no edema.  Lymphadenopathy:    She has no cervical adenopathy.  Neurological: She is alert.  Skin: Skin is warm and dry.     Assessment/Plan Solid food dysphagia. Heme positive stool. EGD with ED and colonoscopy.  REHMAN,NAJEEB U 11/02/2014, 12:46 PM

## 2014-11-06 ENCOUNTER — Encounter (HOSPITAL_COMMUNITY): Payer: Self-pay | Admitting: Internal Medicine

## 2014-11-09 ENCOUNTER — Telehealth (INDEPENDENT_AMBULATORY_CARE_PROVIDER_SITE_OTHER): Payer: Self-pay | Admitting: *Deleted

## 2014-11-09 ENCOUNTER — Encounter: Admit: 2014-11-09 | Discharge: 2014-11-09 | Payer: MEDICARE | Primary: Community Health

## 2014-11-09 DIAGNOSIS — C679 Malignant neoplasm of bladder, unspecified: Secondary | ICD-10-CM

## 2014-11-09 NOTE — Telephone Encounter (Signed)
Patient was given the results of her H-Pylori and Biopsy results per Dr.Rehman. She tested positive for H-Pylori - she will be treated with Pylera - Take 3 capsules by mouth 4 times daily for 10 days. Biopsies were benign - next Colonoscopy in 5 years. Patient was also advised that her may note her stools will become darker and this is due to the Bismuth in the Pylera.

## 2014-11-09 NOTE — Telephone Encounter (Signed)
5 yr TCS noted in recall 

## 2014-11-10 LAB — METABOLIC PANEL, BASIC
BUN/Creatinine ratio: 18 (ref 11–26)
BUN: 14 mg/dL (ref 8–27)
CO2: 22 mmol/L (ref 18–29)
Calcium: 9 mg/dL (ref 8.7–10.3)
Chloride: 107 mmol/L (ref 97–108)
Creatinine: 0.77 mg/dL (ref 0.57–1.00)
GFR est AA: 90 mL/min/{1.73_m2} (ref >59)
GFR est non-AA: 78 mL/min/{1.73_m2} (ref >59)
Glucose: 112 mg/dL — ABNORMAL HIGH (ref 65–99)
Potassium: 4.7 mmol/L (ref 3.5–5.2)
Sodium: 145 mmol/L — ABNORMAL HIGH (ref 134–144)

## 2014-11-16 ENCOUNTER — Encounter: Payer: MEDICARE | Attending: Urology | Primary: Community Health

## 2014-11-16 ENCOUNTER — Inpatient Hospital Stay: Payer: MEDICARE | Attending: Urology | Primary: Community Health

## 2014-11-16 DIAGNOSIS — H35033 Hypertensive retinopathy, bilateral: Secondary | ICD-10-CM | POA: Diagnosis not present

## 2014-11-16 NOTE — Telephone Encounter (Signed)
Pt calling and would like to know the results from her blood work. Pt had to reschedule her appt for today due to her daughter taking her car. Please call pt.

## 2014-11-16 NOTE — Telephone Encounter (Signed)
Sent a message to scheduling to reschedule pt. Thanks

## 2014-11-16 NOTE — Telephone Encounter (Signed)
Pt was supposed to have CT today but states she did not know anything about this.  Could you reschedule this scan as she is supposed to be there at 10 am today?  She is confused I believe.  Her daughter has her care today so she can not come in for her appointment.

## 2014-11-21 ENCOUNTER — Inpatient Hospital Stay: Admit: 2014-11-21 | Payer: MEDICARE | Attending: Urology | Primary: Community Health

## 2014-11-21 DIAGNOSIS — C679 Malignant neoplasm of bladder, unspecified: Secondary | ICD-10-CM

## 2014-11-21 MED ORDER — SODIUM CHLORIDE 0.9% BOLUS IV
0.9 % | Freq: Once | INTRAVENOUS | Status: AC
Start: 2014-11-21 — End: 2014-11-21
  Administered 2014-11-21: 20:00:00 via INTRAVENOUS

## 2014-11-21 MED ORDER — IOPAMIDOL 76 % IV SOLN
370 mg iodine /mL (76 %) | Freq: Once | INTRAVENOUS | Status: AC
Start: 2014-11-21 — End: 2014-11-21
  Administered 2014-11-21: 20:00:00 via INTRAVENOUS

## 2014-11-21 MED ORDER — DIATRIZOATE MEGLUMINE & SODIUM 66 %-10 % ORAL SOLN
66-10 % | Freq: Once | ORAL | Status: AC
Start: 2014-11-21 — End: 2014-11-21
  Administered 2014-11-21: 20:00:00 via ORAL

## 2014-11-21 MED ORDER — SALINE PERIPHERAL FLUSH PRN
Freq: Once | INTRAMUSCULAR | Status: AC
Start: 2014-11-21 — End: 2014-11-21
  Administered 2014-11-21: 20:00:00

## 2014-11-21 MED FILL — ISOVUE-370  76 % INTRAVENOUS SOLUTION: 370 mg iodine /mL (76 %) | INTRAVENOUS | Qty: 100

## 2014-11-21 MED FILL — MD-GASTROVIEW 66 %-10 % ORAL SOLUTION: 66-10 % | ORAL | Qty: 30

## 2014-12-26 ENCOUNTER — Ambulatory Visit (INDEPENDENT_AMBULATORY_CARE_PROVIDER_SITE_OTHER): Payer: Medicare PPO | Admitting: Family Medicine

## 2014-12-26 ENCOUNTER — Encounter: Payer: Self-pay | Admitting: Family Medicine

## 2014-12-26 VITALS — BP 128/70 | HR 76 | Temp 97.6°F | Resp 12 | Ht 67.0 in | Wt 180.0 lb

## 2014-12-26 DIAGNOSIS — Z9889 Other specified postprocedural states: Secondary | ICD-10-CM

## 2014-12-26 DIAGNOSIS — I10 Essential (primary) hypertension: Secondary | ICD-10-CM | POA: Diagnosis not present

## 2014-12-26 DIAGNOSIS — E785 Hyperlipidemia, unspecified: Secondary | ICD-10-CM | POA: Diagnosis not present

## 2014-12-26 LAB — COMPREHENSIVE METABOLIC PANEL
ALK PHOS: 85 U/L (ref 39–117)
ALT: 23 U/L (ref 0–35)
AST: 18 U/L (ref 0–37)
Albumin: 3.2 g/dL — ABNORMAL LOW (ref 3.5–5.2)
BILIRUBIN TOTAL: 0.3 mg/dL (ref 0.2–1.2)
BUN: 8 mg/dL (ref 6–23)
CO2: 26 mEq/L (ref 19–32)
Calcium: 8.1 mg/dL — ABNORMAL LOW (ref 8.4–10.5)
Chloride: 103 mEq/L (ref 96–112)
Creat: 0.76 mg/dL (ref 0.50–1.10)
GLUCOSE: 82 mg/dL (ref 70–99)
Potassium: 3.7 mEq/L (ref 3.5–5.3)
SODIUM: 139 meq/L (ref 135–145)
Total Protein: 5.6 g/dL — ABNORMAL LOW (ref 6.0–8.3)

## 2014-12-26 LAB — LIPID PANEL
CHOL/HDL RATIO: 2.6 ratio
Cholesterol: 169 mg/dL (ref 0–200)
HDL: 64 mg/dL (ref >=46)
LDL Cholesterol: 73 mg/dL (ref 0–99)
Triglycerides: 161 mg/dL — ABNORMAL HIGH (ref <150)
VLDL: 32 mg/dL (ref 0–40)

## 2014-12-26 NOTE — Assessment & Plan Note (Signed)
Blood pressure is well-controlled and change her medication  she is to take one half tablet of hydrochlorothiazide  She has significant swelling.

## 2014-12-26 NOTE — Assessment & Plan Note (Signed)
Discussed importance of keeping her cholesterol down. She is on simvastatin 40 mg we'll recheck levels today

## 2014-12-26 NOTE — Assessment & Plan Note (Signed)
She is doing quite well. She is on Dilantin just for seizure prophylaxis

## 2014-12-26 NOTE — Progress Notes (Signed)
Patient ID: Molly Patel, female   DOB: [birth year], 69 y.o.   MRN: [id number]   Subjective:    Patient ID: Molly Patel, female    DOB: 05-Jul-1946, 69 y.o.   MRN: [id number]  Patient presents for 4 month F/U   patient follow-up chronic medical problems. She is no particular concerns today. She did go back to taking simvastatin 40 mg she did not want to take the Lipitor. She states she's been trying to watch her diet to help with her cholesterol.   Review Of Systems:  GEN- denies fatigue, fever, weight loss,weakness, recent illness HEENT- denies eye drainage, change in vision, nasal discharge, CVS- denies chest pain, palpitations RESP- denies SOB, cough, wheeze ABD- denies N/V, change in stools, abd pain GU- denies dysuria, hematuria, dribbling, incontinence MSK- denies joint pain, muscle aches, injury Neuro- denies headache, dizziness, syncope, seizure activity       Objective:    BP 128/70 mmHg  Pulse 76  Temp(Src) 97.6 F (36.4 C) (Oral)  Resp 12  Ht 5\' 7"  (1.702 m)  Wt 180 lb (81.647 kg)  BMI 28.19 kg/m2 GEN- NAD, alert and oriented x3 HEENT- PERRL, EOMI, non injected sclera, pink conjunctiva, MMM, oropharynx clear Neck- Supple, no bruit CVS- RRR, no murmur RESP-CTAB EXT- No edema Pulses- Radial - 2+        Assessment & Plan:      Problem List Items Addressed This Visit    Risk for falls   Hypertension - Primary   Relevant Medications   simvastatin (ZOCOR) 40 MG tablet   Other Relevant Orders   CBC with Differential/Platelet   Comprehensive metabolic panel   Hyperlipidemia   Relevant Medications   simvastatin (ZOCOR) 40 MG tablet   Other Relevant Orders   Lipid panel      Note: This dictation was prepared with Dragon dictation along with smaller phrase technology. Any transcriptional errors that result from this process are unintentional.

## 2014-12-26 NOTE — Patient Instructions (Addendum)
Take only 1/2 tablet of the HCTZ  We will call with lab results for cholesterol  Restart your aspirin F/U 5 months for Physical

## 2014-12-27 LAB — CBC WITH DIFFERENTIAL/PLATELET
BASOS PCT: 1 % (ref 0–1)
Basophils Absolute: 0 10*3/uL (ref 0.0–0.1)
Eosinophils Absolute: 0.1 10*3/uL (ref 0.0–0.7)
Eosinophils Relative: 2 % (ref 0–5)
HCT: 38.1 % (ref 36.0–46.0)
Hemoglobin: 12.3 g/dL (ref 12.0–15.0)
LYMPHS ABS: 1.4 10*3/uL (ref 0.7–4.0)
Lymphocytes Relative: 33 % (ref 12–46)
MCH: 27.5 pg (ref 26.0–34.0)
MCHC: 32.3 g/dL (ref 30.0–36.0)
MCV: 85.2 fL (ref 78.0–100.0)
MONO ABS: 0.7 10*3/uL (ref 0.1–1.0)
MONOS PCT: 16 % — AB (ref 3–12)
MPV: 9.8 fL (ref 8.6–12.4)
NEUTROS ABS: 2 10*3/uL (ref 1.7–7.7)
Neutrophils Relative %: 48 % (ref 43–77)
Platelets: 236 10*3/uL (ref 150–400)
RBC: 4.47 MIL/uL (ref 3.87–5.11)
RDW: 14.9 % (ref 11.5–15.5)
WBC: 4.1 10*3/uL (ref 4.0–10.5)

## 2015-01-05 ENCOUNTER — Other Ambulatory Visit: Payer: Self-pay | Admitting: Family Medicine

## 2015-01-05 NOTE — Telephone Encounter (Signed)
Refill appropriate and filled per protocol. 

## 2015-01-25 ENCOUNTER — Ambulatory Visit: Admit: 2015-01-25 | Discharge: 2015-01-25 | Payer: MEDICARE | Attending: Urology | Primary: Community Health

## 2015-01-25 DIAGNOSIS — C679 Malignant neoplasm of bladder, unspecified: Secondary | ICD-10-CM

## 2015-01-25 NOTE — Progress Notes (Signed)
[facility]  [address] [place] [address]  [phone]    Cathy Jackson  DOB: 1945/08/28     HPI   69 y.o., female returns in follow up for CaB. S/P anterior pelvic exonteration, B PLND and ileal conduit urinary diversion on 10/13/12. Final path was TCC T1G3NO with neg margins. She has done well post op. BMP on 11/09/14 was unremarkable. CT C/A/P on 11/21/14 shows a cluster or RML lung nodules that are likely inflammatory by report.  Her R renal hypodensity appears stable compared to 04/2013 CT.  No new complaints.      Past Medical History   Diagnosis Date   ??? Arthritis      generalized   ??? Environmental allergies      managed with medication    ??? Hypothyroidism      managed with medication    ??? Depression      managed with medication    ??? Bladder cancer ([place])    ??? History of kidney stones      states surgical removed   ??? Urethral caruncle 10/27/2012   ??? Unspecified disorder of bladder 10/27/2012   ??? Dysuria 10/27/2012   ??? Abdominal pain, unspecified site 10/27/2012   ??? Urinary frequency 10/27/2012   ??? Asthma 10/27/2012   ??? GERD (gastroesophageal reflux disease) 10/27/2012   ??? Urinary tract infection, site not specified 10/27/2012   ??? Other "heavy-for-dates" infants      ureterectomy and ileal conduit     Past Surgical History   Procedure Laterality Date   ??? Hx breast biopsy Left 2000     x 2   ??? Hx cesarean section       x 1   ??? Hx tubal ligation     ??? Pr close cystostomy       cystoscopy/ turbt x 2   ??? Hx wrist fracture tx Left    ??? Hx cholecystectomy       Current Outpatient Prescriptions   Medication Sig Dispense Refill   ??? citalopram (CELEXA) 20 mg tablet   11   ??? donepezil (ARICEPT) 5 mg tablet   0   ??? lisinopril (PRINIVIL, ZESTRIL) 10 mg tablet   11   ??? levocetirizine (XYZAL) 5 mg tablet   11   ??? estradiol (ESTRACE) 0.01 % (0.1 mg/gram) vaginal cream Insert 2 g into vagina daily.     ??? meloxicam (MOBIC) 15 mg tablet Take 15 mg by mouth daily.      ??? simvastatin (ZOCOR) 20 mg tablet Take  by mouth nightly.     ??? fluticasone (FLONASE) 50 mcg/actuation nasal spray nightly.     ??? traMADol (ULTRAM) 50 mg tablet Take 50 mg by mouth four (4) times daily.     ??? polyethylene glycol (MIRALAX) 17 gram/dose powder Take 17 g by mouth daily.     ??? levothyroxine (SYNTHROID) 25 mcg tablet Take 25 mcg by mouth Daily (before breakfast). Indications: HYPOTHYROIDISM     ??? docusate sodium (COLACE) 100 mg capsule Take 100 mg by mouth two (2) times a day.       No Known Allergies  History     Social History   ??? Marital Status: MARRIED     Spouse Name: N/A   ??? Number of Children: N/A   ??? Years of Education: N/A     Occupational History   ??? Not on file.     Social History Main Topics   ???  Smoking status: Former Smoker -- 0.50 packs/day for 20 years   ??? Smokeless tobacco: Never Used      Comment: stopped at age 31   ??? Alcohol Use: No   ??? Drug Use: No   ??? Sexual Activity: Not on file     Other Topics Concern   ??? Not on file     Social History Narrative     Family History   Problem Relation Age of Onset   ??? Stroke Mother    ??? Heart Disease Mother    ??? Cancer Father    ??? Asthma Father    ??? Lung Disease Father      COPD/smoker   ??? Cancer Brother    ??? Heart Disease Brother    ??? Alcohol abuse Brother        Review of Systems  All systems reviewed and are negative at this time.    Physical Exam  BP 115/71 mmHg   Pulse 104   Temp(Src) 99.3 ??F (37.4 ??C) (Tympanic)   Ht 5\' 2"  (1.575 m)   Wt 146 lb 3.2 oz (66.316 kg)   BMI 26.73 kg/m2  General appearance - alert, well appearing, and in no distress  Mental status - alert, oriented to person, place, and time  Eyes - extraocular eye movements intact, sclera anicteric  Abdomen - soft, nontender, nondistended, no masses or organomegaly.  Urostomy pink and viable.  Reducible parastomal hernia present.  Neurological -  normal speech, no focal findings or movement disorder noted  Skin - normal coloration and turgor          Assessment/Plan     ICD-10-CM ICD-9-CM    1. Malignant neoplasm of urinary bladder, unspecified site (HCC) D78.2 423.5 METABOLIC PANEL, BASIC      CT CHEST ABD PELV W CONT   2. Parastomal hernia without obstruction or gangrene (HCC) K43.5 569.69      Pros/cons of parastomal hernia repair reviewed.  Pt has elected to cont observations.  Signs of obstruction/incarceration were reviewed.  She was instructed to go straight to the ER for these signs/symptoms.  RTO in 12 mo with CT C/A/P and BMP prior.    Amyra Vantuyl P Tyee Vandevoorde, DO

## 2015-05-02 ENCOUNTER — Encounter: Payer: Self-pay | Admitting: Family Medicine

## 2015-05-02 ENCOUNTER — Ambulatory Visit (INDEPENDENT_AMBULATORY_CARE_PROVIDER_SITE_OTHER): Payer: Medicare PPO | Admitting: Family Medicine

## 2015-05-02 ENCOUNTER — Ambulatory Visit (HOSPITAL_COMMUNITY)
Admission: RE | Admit: 2015-05-02 | Discharge: 2015-05-02 | Disposition: A | Discharge status: Home / Self Care | Payer: Medicare PPO | Source: Ambulatory Visit | Attending: Family Medicine | Admitting: Family Medicine

## 2015-05-02 VITALS — BP 128/88 | HR 82 | Temp 97.5°F | Resp 16 | Ht 67.0 in | Wt 174.0 lb

## 2015-05-02 DIAGNOSIS — G9389 Other specified disorders of brain: Secondary | ICD-10-CM | POA: Diagnosis not present

## 2015-05-02 DIAGNOSIS — I639 Cerebral infarction, unspecified: Secondary | ICD-10-CM

## 2015-05-02 DIAGNOSIS — R4781 Slurred speech: Secondary | ICD-10-CM | POA: Insufficient documentation

## 2015-05-02 DIAGNOSIS — R200 Anesthesia of skin: Secondary | ICD-10-CM | POA: Diagnosis not present

## 2015-05-02 DIAGNOSIS — Z86011 Personal history of benign neoplasm of the brain: Secondary | ICD-10-CM | POA: Insufficient documentation

## 2015-05-02 DIAGNOSIS — I6350 Cerebral infarction due to unspecified occlusion or stenosis of unspecified cerebral artery: Secondary | ICD-10-CM | POA: Diagnosis not present

## 2015-05-02 DIAGNOSIS — I6789 Other cerebrovascular disease: Secondary | ICD-10-CM | POA: Insufficient documentation

## 2015-05-02 DIAGNOSIS — R2990 Unspecified symptoms and signs involving the nervous system: Secondary | ICD-10-CM

## 2015-05-02 LAB — COMPREHENSIVE METABOLIC PANEL
ALT: 27 U/L (ref 6–29)
AST: 22 U/L (ref 10–35)
Albumin: 4.3 g/dL (ref 3.6–5.1)
Alkaline Phosphatase: 97 U/L (ref 33–130)
BILIRUBIN TOTAL: 0.3 mg/dL (ref 0.2–1.2)
BUN: 17 mg/dL (ref 7–25)
CALCIUM: 9.3 mg/dL (ref 8.6–10.4)
CHLORIDE: 99 mmol/L (ref 98–110)
CO2: 27 mmol/L (ref 20–31)
Creat: 0.93 mg/dL (ref 0.60–0.93)
Glucose, Bld: 83 mg/dL (ref 70–99)
Potassium: 3.8 mmol/L (ref 3.5–5.3)
Sodium: 139 mmol/L (ref 135–146)
Total Protein: 7.5 g/dL (ref 6.1–8.1)

## 2015-05-02 LAB — CBC W/MCH & 3 PART DIFF
HCT: 39.5 % (ref 36.0–46.0)
Hemoglobin: 12.8 g/dL (ref 12.0–15.0)
LYMPHS ABS: 1.9 10*3/uL (ref 0.7–4.0)
Lymphocytes Relative: 43 % (ref 12–46)
MCH: 27.6 pg (ref 26.0–34.0)
MCHC: 32.4 g/dL (ref 30.0–36.0)
MCV: 85.3 fL (ref 78.0–100.0)
NEUTROS ABS: 1.9 10*3/uL (ref 1.7–7.7)
Neutrophils Relative %: 43 % (ref 43–77)
Platelets: 160 10*3/uL (ref 150–400)
RBC: 4.63 MIL/uL (ref 3.87–5.11)
RDW: 16.6 % — AB (ref 11.5–15.5)
WBC: 4.5 10*3/uL (ref 4.0–10.5)
WBCMIX: 0.6 10*3/uL (ref 0.1–1.8)
WBCMIXPER: 14 % (ref 3–18)

## 2015-05-02 NOTE — Patient Instructions (Signed)
Go to [facility] for CT scan I will call with results F/U pending results

## 2015-05-02 NOTE — Progress Notes (Signed)
Patient ID: Molly Patel, female   DOB: 02/22/1946, 69 y.o.   MRN: [id number]   Subjective:    Patient ID: Molly Patel, female    DOB: 14-Oct-1945, 69 y.o.   MRN: [id number]  Patient presents for L Hand/Arm Tingling/ Numbness and Facial Edema  patient here with intermittent episodes of tingling and numbness in her left arm and hand she also had some weakness in her left leg and feels like her lips are wobbly the symptoms have been on and off for the past week actually. She also admitted to one day having some numbness on the left side of her face and her lips but that resolved after a few seconds. She denies any headache. She does have history of meningioma status post craniotomy she also noticed that she has some swelling along her scar on the left side of her face that was not present before. She was afraid to go to the emergency room as she was concerned about what the outcome could be. She's not had any medication changes states that she's been checking her blood  Pressure regular basis and this has been normal. She has had some weakness in her right hand when she tries to open jars for the past month but that is different from the symptoms that she's had over the past week. She denies any chest pain or shortness of breath.   Review Of Systems: per above    GEN-+fatigue, fever, weight loss,weakness, recent illness HEENT- denies eye drainage, change in vision, nasal discharge, CVS- denies chest pain, palpitations RESP- denies SOB, cough, wheeze ABD- denies N/V, change in stools, abd pain GU- denies dysuria, hematuria, dribbling, incontinence MSK- denies joint pain, muscle aches, injury Neuro- denies headache, dizziness, syncope, seizure activity       Objective:    BP 128/88 mmHg  Pulse 82  Temp(Src) 97.5 F (36.4 C) (Oral)  Resp 16  Ht 5\' 7"  (1.702 m)  Wt 174 lb (78.926 kg)  BMI 27.25 kg/m2 GEN- NAD, alert and oriented x3 HEENT- PERRL, EOMI, non injected sclera, pink  conjunctiva, MMM, oropharynx clear, eye blinking unchanged (chronic problem) CVS- RRR, no murmur RESP-CTAB Neuro-CNII-XII in tact, slow gait, a little unsteady ( no cane today), LUE 4+/5 compared to  EXT- No edema Pulses- Radial 2+        Assessment & Plan:      Problem List Items Addressed This Visit    None    Visit Diagnoses    Stroke-like symptoms    -  Primary    STAT CT of head done, showing area near previous resection with encephalomalcia  concern for infarct vs edema and MRI needed. Discussed with pt, as risk factors for Stroke. Increase ASA to 325mg  once a day Continue dilantin, MRI of brain to be done, if it is a stroke would be sub-acute    Relevant Orders    CT Head Wo Contrast (Completed)    Comprehensive metabolic panel    CBC w/MCH & 3 Part Diff (Completed)       Note: This dictation was prepared with Dragon dictation along with smaller phrase technology. Any transcriptional errors that result from this process are unintentional.

## 2015-05-03 NOTE — Addendum Note (Signed)
Addended by: Sheral Flow on: 05/03/2015 11:21 AM   Modules accepted: Orders

## 2015-05-04 ENCOUNTER — Other Ambulatory Visit: Payer: Self-pay | Admitting: Family Medicine

## 2015-05-04 ENCOUNTER — Ambulatory Visit (HOSPITAL_COMMUNITY)
Admission: RE | Admit: 2015-05-04 | Discharge: 2015-05-04 | Disposition: A | Discharge status: Home / Self Care | Payer: Medicare PPO | Source: Ambulatory Visit | Attending: Family Medicine | Admitting: Family Medicine

## 2015-05-04 DIAGNOSIS — I639 Cerebral infarction, unspecified: Secondary | ICD-10-CM | POA: Diagnosis not present

## 2015-05-04 DIAGNOSIS — R202 Paresthesia of skin: Secondary | ICD-10-CM | POA: Diagnosis not present

## 2015-05-04 DIAGNOSIS — R200 Anesthesia of skin: Secondary | ICD-10-CM | POA: Insufficient documentation

## 2015-05-04 DIAGNOSIS — R2990 Unspecified symptoms and signs involving the nervous system: Secondary | ICD-10-CM

## 2015-05-04 DIAGNOSIS — I998 Other disorder of circulatory system: Secondary | ICD-10-CM

## 2015-05-04 DIAGNOSIS — G9389 Other specified disorders of brain: Secondary | ICD-10-CM | POA: Diagnosis not present

## 2015-05-16 ENCOUNTER — Ambulatory Visit (HOSPITAL_COMMUNITY): Payer: Medicare PPO

## 2015-05-16 ENCOUNTER — Ambulatory Visit (HOSPITAL_COMMUNITY): Admission: RE | Admit: 2015-05-16 | Payer: Medicare PPO | Source: Ambulatory Visit

## 2015-05-22 ENCOUNTER — Other Ambulatory Visit: Payer: Self-pay | Admitting: Family Medicine

## 2015-05-23 NOTE — Telephone Encounter (Signed)
Refill appropriate and filled per protocol. 

## 2015-05-24 ENCOUNTER — Other Ambulatory Visit: Payer: Self-pay | Admitting: Family Medicine

## 2015-05-24 NOTE — Telephone Encounter (Signed)
Refill appropriate and filled per protocol. 

## 2015-05-28 ENCOUNTER — Encounter: Payer: Self-pay | Admitting: Family Medicine

## 2015-05-28 ENCOUNTER — Ambulatory Visit (INDEPENDENT_AMBULATORY_CARE_PROVIDER_SITE_OTHER): Payer: Medicare PPO | Admitting: Family Medicine

## 2015-05-28 VITALS — BP 122/78 | HR 64 | Temp 98.1°F | Resp 16 | Ht 67.0 in | Wt 173.0 lb

## 2015-05-28 DIAGNOSIS — I639 Cerebral infarction, unspecified: Secondary | ICD-10-CM | POA: Diagnosis not present

## 2015-05-28 DIAGNOSIS — Z0000 Encounter for general adult medical examination without abnormal findings: Secondary | ICD-10-CM | POA: Diagnosis not present

## 2015-05-28 DIAGNOSIS — Z23 Encounter for immunization: Secondary | ICD-10-CM

## 2015-05-28 DIAGNOSIS — Z8673 Personal history of transient ischemic attack (TIA), and cerebral infarction without residual deficits: Secondary | ICD-10-CM | POA: Insufficient documentation

## 2015-05-28 DIAGNOSIS — I10 Essential (primary) hypertension: Secondary | ICD-10-CM

## 2015-05-28 DIAGNOSIS — E785 Hyperlipidemia, unspecified: Secondary | ICD-10-CM | POA: Diagnosis not present

## 2015-05-28 LAB — LIPID PANEL
Cholesterol: 187 mg/dL (ref 125–200)
HDL: 70 mg/dL (ref >=46)
LDL CALC: 80 mg/dL (ref <130)
TRIGLYCERIDES: 184 mg/dL — AB (ref <150)
Total CHOL/HDL Ratio: 2.7 Ratio (ref <=5.0)
VLDL: 37 mg/dL — ABNORMAL HIGH (ref <30)

## 2015-05-28 MED ORDER — ASPIRIN EC 325 MG PO TBEC: 325.0000 mg | DELAYED_RELEASE_TABLET | Freq: Every day | ORAL | Status: DC

## 2015-05-28 NOTE — Assessment & Plan Note (Signed)
Recheck lipids, goal LDL < 100 with recent stroke

## 2015-05-28 NOTE — Patient Instructions (Addendum)
We will call with lab results Continue current medication Take Aspirin 235mg  once a day  Pneumonia shot given and flu shot given  F/U 4 months

## 2015-05-28 NOTE — Assessment & Plan Note (Signed)
Well controlled 

## 2015-05-28 NOTE — Progress Notes (Signed)
Patient ID: Molly Patel, female   DOB: May 25, 1946, 69 y.o.   MRN: [id number] Subjective:   Patient presents for Medicare Annual/Subsequent preventive examination.       Pt here for CPE, she has no concerns, she does not want further work up with regards to her abnormal MRI of brain and the spells she presented with. She had a recent infarct and is on ASA still 81mg , she also has proteinaceous materal noted vs collateral blood vessels which warrented MRA but she declines. She feels she is back at her baseline.   Medications reviewed   No recent falls  CMET AND CBC reviewed from last month     Review Past Medical/Family/Social: Per EMR   Risk Factors  Current exercise habits: walks  Dietary issues discussed: yes  Cardiac risk factors: HTN, hyperlipidemia    Depression Screen  (Note: if answer to either of the following is "Yes", a more complete depression screening is indicated)  Over the past two weeks, have you felt down, depressed or hopeless? No Over the past two weeks, have you felt little interest or pleasure in doing things? No Have you lost interest or pleasure in daily life? No Do you often feel hopeless? No Do you cry easily over simple problems? No   Activities of Daily Living  In your present state of health, do you have any difficulty performing the following activities?:  Driving? No  Managing money? No  Feeding yourself? No  Getting from bed to chair? No  Climbing a flight of stairs? No  Preparing food and eating?: No  Bathing or showering? No  Getting dressed: No  Getting to the toilet? No  Using the toilet:No  Moving around from place to place: No  In the past year have you fallen or had a near fall?:No  Are you sexually active? No  Do you have more than one partner? No   Hearing Difficulties: No  Do you often ask people to speak up or repeat themselves? No  Do you experience ringing or noises in your ears? No Do you have difficulty understanding soft  or whispered voices? No  Do you feel that you have a problem with memory? No Do you often misplace items? No  Do you feel safe at home? Yes  Cognitive Testing  Alert? Yes Normal Appearance?Yes  Oriented to person? Yes Place? Yes  Time? Yes  Recall of three objects? Yes  Can perform simple calculations? Yes  Displays appropriate judgment?Yes  Can read the correct time from a watch face?Yes   List the Names of Other Physician/Practitioners you currently use:   Opthalmology   Screening Tests / Date Colonoscopy         UTD            Zostavax  Declined  Mammogram - Declines further Influenza Vaccine Due  Tetanus/tdap- Declines   ROS: GEN- denies fatigue, fever, weight loss,weakness, recent illness HEENT- denies eye drainage, change in vision, nasal discharge, CVS- denies chest pain, palpitations RESP- denies SOB, cough, wheeze ABD- denies N/V, change in stools, abd pain GU- denies dysuria, hematuria, dribbling, incontinence MSK- denies joint pain, muscle aches, injury Neuro- denies headache, dizziness, syncope, seizure activity  PHYSICAL: GEN- NAD, alert and oriented x3 HEENT- PERRL, EOMI, non injected sclera, pink conjunctiva, MMM, oropharynx clear, eye blinking unchanged (chronic problem) CVS- RRR, no murmur RESP-CTAB ABD-NABS,soft,NT,ND Neuro-CNII-XII in tact, slow gait, , LUE 4+/5 compared to  EXT- No edema Pulses- Radial, DP- 2+  Assessment:    Annual wellness medicare exam   Plan:    During the course of the visit the patient was educated and counseled about appropriate screening and preventive services including:  Screening mammography - declines further  Given Flu shot and PREVNAR 13  Discussed living will- given information on advanced directives and MOST form    Diet review for nutrition referral? Yes ____ Not Indicated __x__  Patient Instructions (the written plan) was given to the patient.  Medicare Attestation  I have personally reviewed:  The  patient's medical and social history  Their use of alcohol, tobacco or illicit drugs  Their current medications and supplements  The patient's functional ability including ADLs,fall risks, home safety risks, cognitive, and hearing and visual impairment  Diet and physical activities  Evidence for depression or mood disorders  The patient's weight, height, BMI, and visual acuity have been recorded in the chart. I have made referrals, counseling, and provided education to the patient based on review of the above and I have provided the patient with a written personalized care plan for preventive services.

## 2015-06-07 ENCOUNTER — Other Ambulatory Visit: Payer: Self-pay | Admitting: Family Medicine

## 2015-06-08 NOTE — Telephone Encounter (Signed)
Refill appropriate and filled per protocol. 

## 2015-06-18 ENCOUNTER — Telehealth: Payer: Self-pay | Admitting: Family Medicine

## 2015-06-18 NOTE — Telephone Encounter (Signed)
Pt is calling to let Dr. Buelah Manis know that she wants to go ahead and get an appt to have a brain scan. You can reach her @ [phone]

## 2015-06-18 NOTE — Telephone Encounter (Signed)
To referrals coordinator.

## 2015-06-20 NOTE — Telephone Encounter (Signed)
Pt is scheduled at [facility] on Nov 15 at4:45pm, left message for pt to return my call for appt

## 2015-06-21 NOTE — Telephone Encounter (Signed)
Pt called back and aware of appt 

## 2015-07-10 ENCOUNTER — Ambulatory Visit (HOSPITAL_COMMUNITY)
Admission: RE | Admit: 2015-07-10 | Discharge: 2015-07-10 | Disposition: A | Discharge status: Home / Self Care | Payer: Medicare PPO | Source: Ambulatory Visit | Attending: Family Medicine | Admitting: Family Medicine

## 2015-07-10 DIAGNOSIS — I998 Other disorder of circulatory system: Secondary | ICD-10-CM | POA: Diagnosis not present

## 2015-07-10 DIAGNOSIS — I639 Cerebral infarction, unspecified: Secondary | ICD-10-CM | POA: Insufficient documentation

## 2015-07-10 MED ORDER — GADOBENATE DIMEGLUMINE 529 MG/ML IV SOLN
15.0000 mL | Freq: Once | INTRAVENOUS | Status: AC | PRN
  Administered 2015-07-10: 15 mL via INTRAVENOUS

## 2015-07-23 LAB — POCT I-STAT CREATININE: CREATININE: 0.8 mg/dL (ref 0.44–1.00)

## 2015-08-14 ENCOUNTER — Encounter: Payer: Self-pay | Admitting: Family Medicine

## 2015-08-14 ENCOUNTER — Ambulatory Visit (INDEPENDENT_AMBULATORY_CARE_PROVIDER_SITE_OTHER): Payer: Medicare PPO | Admitting: Family Medicine

## 2015-08-14 VITALS — BP 120/74 | HR 72 | Temp 97.7°F | Resp 16 | Ht 67.0 in | Wt 175.0 lb

## 2015-08-14 DIAGNOSIS — I639 Cerebral infarction, unspecified: Secondary | ICD-10-CM | POA: Diagnosis not present

## 2015-08-14 DIAGNOSIS — M791 Myalgia, unspecified site: Secondary | ICD-10-CM

## 2015-08-14 LAB — COMPREHENSIVE METABOLIC PANEL
ALBUMIN: 4.2 g/dL (ref 3.6–5.1)
ALK PHOS: 124 U/L (ref 33–130)
ALT: 58 U/L — AB (ref 6–29)
AST: 34 U/L (ref 10–35)
BUN: 11 mg/dL (ref 7–25)
CHLORIDE: 98 mmol/L (ref 98–110)
CO2: 28 mmol/L (ref 20–31)
CREATININE: 0.65 mg/dL (ref 0.60–0.93)
Calcium: 8.8 mg/dL (ref 8.6–10.4)
Glucose, Bld: 95 mg/dL (ref 70–99)
Potassium: 3.8 mmol/L (ref 3.5–5.3)
SODIUM: 138 mmol/L (ref 135–146)
TOTAL PROTEIN: 7.2 g/dL (ref 6.1–8.1)
Total Bilirubin: 0.3 mg/dL (ref 0.2–1.2)

## 2015-08-14 LAB — CBC WITH DIFFERENTIAL/PLATELET
BASOS ABS: 0 10*3/uL (ref 0.0–0.1)
BASOS PCT: 1 % (ref 0–1)
EOS ABS: 0.1 10*3/uL (ref 0.0–0.7)
Eosinophils Relative: 2 % (ref 0–5)
HCT: 39.9 % (ref 36.0–46.0)
HEMOGLOBIN: 13.4 g/dL (ref 12.0–15.0)
LYMPHS ABS: 1.6 10*3/uL (ref 0.7–4.0)
Lymphocytes Relative: 35 % (ref 12–46)
MCH: 29.1 pg (ref 26.0–34.0)
MCHC: 33.6 g/dL (ref 30.0–36.0)
MCV: 86.7 fL (ref 78.0–100.0)
MONOS PCT: 9 % (ref 3–12)
MPV: 9.4 fL (ref 8.6–12.4)
Monocytes Absolute: 0.4 10*3/uL (ref 0.1–1.0)
NEUTROS ABS: 2.4 10*3/uL (ref 1.7–7.7)
NEUTROS PCT: 53 % (ref 43–77)
PLATELETS: 198 10*3/uL (ref 150–400)
RBC: 4.6 MIL/uL (ref 3.87–5.11)
RDW: 16.1 % — ABNORMAL HIGH (ref 11.5–15.5)
WBC: 4.6 10*3/uL (ref 4.0–10.5)

## 2015-08-14 LAB — CK: CK TOTAL: 81 U/L (ref 7–177)

## 2015-08-14 MED ORDER — METHYLPREDNISOLONE 4 MG PO TBPK: ORAL_TABLET | ORAL | Status: DC

## 2015-08-14 NOTE — Assessment & Plan Note (Signed)
Declines neurology again, even with her multiple fleeting neurological symptoms, seems she just wants me to be aware but does not want intervention.  She is on statin, ASA, has good BP control.

## 2015-08-14 NOTE — Patient Instructions (Signed)
Schedule a visit with your eye doctor Take the prednisone Continue the other medications F/U as previous

## 2015-08-14 NOTE — Progress Notes (Signed)
Patient ID: Molly Patel, female   DOB: 1945-10-22, 69 y.o.   MRN: [id number]   Subjective:    Patient ID: Molly Patel, female    DOB: 1946-01-31, 69 y.o.   MRN: [id number]  Patient presents for Generalized Pain and F/U Stroke Like Sx  patient here for follow-up. She presented with stroke like symptoms a in September. 2016 Urgent CT of head was obtained which showed left frontal encephalomalacia from her prior resection of her meningioma there was also concern for a new infarct therefore MRI of bin was obtained  MRI of brain showed new infarct as well as a questionable area of proteinaceous material verus hemorrhage this is thought to be not an accurate hemorrhage review with neurosurgery.MRA of head and neck was done. The scans were normal. She has been on aspirin 325 mg once a day an I recommended tat she consider neurologist but she went to hold off at that time.   She is here today after we called with results of her MRA which has been approx 1 month since CVA she began having some aching across her collar bone which went into both shoulders. She is stiff in the morning has problems raising up her arms but it gets better as the day goes on. She then stated its much better but wanted me to just check it. Occasionally she has blurry vision but this was before CVA and stroke not in occipital region.     Review Of Systems:  GEN- denies fatigue, fever, weight loss,weakness, recent illness HEENT- denies eye drainage, +change in vision, nasal discharge, CVS- denies chest pain, palpitations RESP- denies SOB, cough, wheeze ABD- denies N/V, change in stools, abd pain GU- denies dysuria, hematuria, dribbling, incontinence MSK- denies joint pain, +muscle aches, injury Neuro- denies headache, +dizziness, syncope, seizure activity       Objective:    BP 120/74 mmHg  Pulse 72  Temp(Src) 97.7 F (36.5 C) (Oral)  Resp 16  Ht 5\' 7"  (1.702 m)  Wt 175 lb (79.379 kg)  BMI 27.40 kg/m2 GEN-  NAD, alert and oriented x3 HEENT- PERRL, EOMI, non injected sclera, pink conjunctiva, MMM, oropharynx clear,  CVS- RRR, no murmur RESP-CTAB Neuro-CNII-XII in tact, slow gait, LUE 4+/5 compared to  Right MSK- Fair ROM upper ext- equal ROM, normal tone, rotator cuff intact, good ROM Neck EXT- No edema Pulses- Radial 2+       Assessment & Plan:      Problem List Items Addressed This Visit    CVA (cerebral infarction)    Declines neurology again, even with her multiple fleeting neurological symptoms, seems she just wants me to be aware but does not want intervention.  She is on statin, ASA, has good BP control.  Recommend she f/u with eye doctor        Other Visit Diagnoses    Myalgia    -  Primary    Unclear cause but I see no relation to CVA, ? related to statin, check CK levels, give prednisone, may be more OA related as well, exam looks stable    Relevant Orders    CBC with Differential/Platelet    Comprehensive metabolic panel    CK       Note: This dictation was prepared with Dragon dictation along with smaller phrase technology. Any transcriptional errors that result from this process are unintentional.

## 2015-08-16 ENCOUNTER — Other Ambulatory Visit: Payer: Self-pay | Admitting: *Deleted

## 2015-08-16 MED ORDER — METHYLPREDNISOLONE 4 MG PO TBPK: ORAL_TABLET | ORAL | Status: DC

## 2015-08-27 ENCOUNTER — Other Ambulatory Visit: Payer: Self-pay | Admitting: Family Medicine

## 2015-08-28 NOTE — Telephone Encounter (Signed)
Refill denied.   Patient should contact provider first.

## 2015-08-29 ENCOUNTER — Ambulatory Visit: Admit: 2015-08-29 | Discharge: 2015-08-29 | Payer: MEDICARE | Attending: Internal Medicine | Primary: Community Health

## 2015-08-29 DIAGNOSIS — I10 Essential (primary) hypertension: Secondary | ICD-10-CM

## 2015-08-29 NOTE — Progress Notes (Signed)
Cathy Jackson is a 70 y.o. WHITE [place] female.    Here to establish care. Previously a patient of Dr. Sherren Mocha who has moved from [place].  She is caring for her husband who is on hospice.   BP looks great. Not sure when her last blood work was but thinks it was fairly recently.   Last mammogram was in the last couple of months at [facility]. Will need repeat colonoscopy this May. Does not need Pap smears. Has never had a DEXA scan.  She is not interested in flu or pneumonia vaccines.  Has h/o bladder cancer and has had cystectomy. Follows with Dr. Myrna Blazer.   She has no complaints today.     Past Medical History   Diagnosis Date   ??? Abdominal pain, unspecified site 10/27/2012   ??? Arthritis      generalized   ??? Asthma 10/27/2012   ??? Bladder cancer ([place])    ??? Depression      managed with medication    ??? Dysuria 10/27/2012   ??? Environmental allergies      managed with medication    ??? GERD (gastroesophageal reflux disease) 10/27/2012   ??? History of kidney stones      states surgical removed   ??? Hypothyroidism      managed with medication    ??? Other "heavy-for-dates" infants      ureterectomy and ileal conduit   ??? Unspecified disorder of bladder 10/27/2012   ??? Urethral caruncle 10/27/2012   ??? Urinary frequency 10/27/2012   ??? Urinary tract infection, site not specified 10/27/2012     Past Surgical History   Procedure Laterality Date   ??? Hx breast biopsy Left 2000     x 2   ??? Hx cesarean section       x 1   ??? Hx tubal ligation     ??? Pr close cystostomy       cystoscopy/ turbt x 2   ??? Hx wrist fracture tx Left    ??? Hx cholecystectomy       Social History     Social History   ??? Marital status: MARRIED     Spouse name: N/A   ??? Number of children: N/A   ??? Years of education: N/A     Occupational History   ??? Not on file.     Social History Main Topics   ??? Smoking status: Former Smoker     Packs/day: 0.50     Years: 20.00   ??? Smokeless tobacco: Never Used      Comment: stopped at age 33   ??? Alcohol use No   ??? Drug use: No    ??? Sexual activity: Not on file     Other Topics Concern   ??? Not on file     Social History Narrative     Family History   Problem Relation Age of Onset   ??? Stroke Mother    ??? Heart Disease Mother    ??? Cancer Father    ??? Asthma Father    ??? Lung Disease Father      COPD/smoker   ??? Cancer Brother    ??? Heart Disease Brother    ??? Alcohol abuse Brother      Current Outpatient Prescriptions   Medication Sig Dispense Refill   ??? memantine (NAMENDA) 10 mg tablet Take 10 mg by mouth.     ??? montelukast (SINGULAIR) 10 mg tablet      ???  citalopram (CELEXA) 20 mg tablet   11   ??? lisinopril (PRINIVIL, ZESTRIL) 10 mg tablet   11   ??? levocetirizine (XYZAL) 5 mg tablet   11   ??? estradiol (ESTRACE) 0.01 % (0.1 mg/gram) vaginal cream Insert 2 g into vagina daily.     ??? meloxicam (MOBIC) 15 mg tablet Take 15 mg by mouth daily.     ??? simvastatin (ZOCOR) 20 mg tablet Take  by mouth nightly.     ??? fluticasone (FLONASE) 50 mcg/actuation nasal spray nightly.     ??? traMADol (ULTRAM) 50 mg tablet Take 50 mg by mouth four (4) times daily.     ??? polyethylene glycol (MIRALAX) 17 gram/dose powder Take 17 g by mouth daily.     ??? levothyroxine (SYNTHROID) 25 mcg tablet Take 25 mcg by mouth Daily (before breakfast). Indications: HYPOTHYROIDISM     ??? docusate sodium (COLACE) 100 mg capsule Take 100 mg by mouth two (2) times a day.     ??? cyanocobalamin (VITAMIN B12) 1,000 mcg/mL injection INJECT 1 ML INTRAMUSCLUARLY ONCE MONTHLY  11   ??? hydroCHLOROthiazide (HYDRODIURIL) 12.5 mg tablet        No Known Allergies    Review of Systems   Constitutional: Negative.    HENT: Negative.    Eyes: Negative for blurred vision, pain and redness.        Wears glasses   Respiratory: Negative.    Cardiovascular: Negative.    Gastrointestinal: Positive for heartburn (occ).   Genitourinary:        Ostomy secondary to cystectomy   Musculoskeletal: Negative.    Skin: Negative for rash.   Neurological: Negative for dizziness, sensory change and focal weakness.    Endo/Heme/Allergies: Negative.    Psychiatric/Behavioral: Negative for depression and memory loss. The patient is not nervous/anxious and does not have insomnia.        Visit Vitals   ??? BP 117/71 (BP 1 Location: Left arm, BP Patient Position: Sitting)   ??? Pulse (!) 102   ??? Temp 98.1 ??F (36.7 ??C) (Oral)   ??? Resp 16   ??? Ht 5\' 2"  (1.575 m)   ??? Wt 143 lb 3.2 oz (65 kg)   ??? SpO2 97%   ??? BMI 26.19 kg/m2       Physical Exam   Constitutional: She is oriented to person, place, and time and well-developed, well-nourished, and in no distress.   HENT:   Head: Normocephalic and atraumatic.   Right Ear: External ear normal.   Left Ear: External ear normal.   Mouth/Throat: Oropharynx is clear and moist.   dentures   Eyes: Conjunctivae and EOM are normal. Pupils are equal, round, and reactive to light.   Neck: Normal range of motion. Neck supple. No JVD present. No tracheal deviation present. No thyromegaly present.   Cardiovascular: Normal rate, regular rhythm and normal heart sounds.    Pulmonary/Chest: Effort normal and breath sounds normal.   Abdominal: Soft. Bowel sounds are normal. She exhibits no mass. There is no tenderness.   No hepatosplenomegaly   Genitourinary:   Genitourinary Comments: Ostomy bag in place right lower abdomen   Musculoskeletal: Normal range of motion. She exhibits no edema or tenderness.   Lymphadenopathy:     She has no cervical adenopathy.   Neurological: She is alert and oriented to person, place, and time. Gait normal.   Skin: Skin is warm and dry. No rash noted.   Psychiatric: Mood, memory, affect and judgment normal.  Assessment and Plan    1. Malignant neoplasm of urinary bladder, unspecified site ([place])  STABLE--SHE FOLLOWS WITH DR. STERRETT    2. GERD without esophagitis  CURRENTLY ON NO PRESCRIPTION MEDICATION    3. Acquired hypothyroidism  WILL CHECK DR. TODD'S RECORDS TO SEE WHEN SHE HAD HER LAST TSH CHECKED--SHE THINKS IT WAS PRETTY RECENTLY. CONTINUE LEVOTHYROXINE     4. Essential hypertension  GREAT CONTROL    5. Primary osteoarthritis involving multiple joints  CONTINUE MOBIC AND TRAMADOL    6. Dysthymia  CONTINUE CELEXA    7. Mild persistent asthma without complication  CONTINUE SINGULAIR    8. Mixed hyperlipidemia  CONTINUE ZOCOR AND CHECK PREVIOUS PCP RECORDS FOR MOST RECENT LIPID PROFILE    9. Perennial allergic rhinitis, unspecified allergic rhinitis trigger  CONTINUE FLONASE, XYZAL      WHY IS SHE ON NAMENDA???

## 2015-09-06 ENCOUNTER — Telehealth: Payer: Self-pay | Admitting: *Deleted

## 2015-09-06 DIAGNOSIS — Z1231 Encounter for screening mammogram for malignant neoplasm of breast: Secondary | ICD-10-CM | POA: Diagnosis not present

## 2015-09-06 LAB — HM MAMMOGRAPHY

## 2015-09-06 NOTE — Telephone Encounter (Signed)
Received call from patient.   Reports that she would like to proceed with referral to Neurology at this time D/T stroke like Sx.  MD please advise.

## 2015-09-07 ENCOUNTER — Other Ambulatory Visit: Payer: Self-pay | Admitting: Family Medicine

## 2015-09-07 DIAGNOSIS — M791 Myalgia, unspecified site: Secondary | ICD-10-CM

## 2015-09-07 DIAGNOSIS — Z8673 Personal history of transient ischemic attack (TIA), and cerebral infarction without residual deficits: Secondary | ICD-10-CM

## 2015-09-07 NOTE — Telephone Encounter (Signed)
Contacted pt and she stated that her symptoms were like last time when she had stroke, with shoulder pain that starts at her collar bone and radiates done, stated that you had mentioned neurology before she finished the Predisone dose pak has relived her some.

## 2015-09-07 NOTE — Telephone Encounter (Signed)
Pt aware and referral placed to neurology

## 2015-09-07 NOTE — Telephone Encounter (Signed)
See what symptoms she is having, I was going to have her see them for follow up before, is there something NEW??

## 2015-09-07 NOTE — Telephone Encounter (Signed)
Tell pt she can get visit with the Neurology- for previous CVA  But I doubt the shoulder and collar bone are related to the stroke  It may be from the cholesterol medicine or arthritis since it improved with the prednisone Try stopping the cholesterol medicine for 2 weeks to see if this helps as well  Go ahead and place referral to neurology

## 2015-09-10 ENCOUNTER — Encounter: Payer: Self-pay | Admitting: *Deleted

## 2015-09-27 NOTE — Telephone Encounter (Signed)
Need bags due to bladder surgery, need Rx from doctor

## 2015-09-27 NOTE — Telephone Encounter (Signed)
Company faxed supply orders to our office with all needed, placed on Dr. Budd Palmer desk for signature. Will fax in once completed.

## 2015-09-27 NOTE — Telephone Encounter (Signed)
Needs written rx for colostomy bags/supplies.

## 2015-09-27 NOTE — Telephone Encounter (Signed)
Can't her urologist order this? For me to order, I need to know exactly what she needs because this is not something I've ordered before. Thanks!

## 2015-10-01 ENCOUNTER — Other Ambulatory Visit: Payer: Self-pay | Admitting: Family Medicine

## 2015-10-01 ENCOUNTER — Ambulatory Visit (INDEPENDENT_AMBULATORY_CARE_PROVIDER_SITE_OTHER): Payer: Medicare PPO | Admitting: Family Medicine

## 2015-10-01 ENCOUNTER — Encounter: Payer: Self-pay | Admitting: Family Medicine

## 2015-10-01 VITALS — BP 128/68 | HR 64 | Temp 98.0°F | Resp 18 | Ht 67.0 in | Wt 171.0 lb

## 2015-10-01 DIAGNOSIS — M25511 Pain in right shoulder: Secondary | ICD-10-CM | POA: Diagnosis not present

## 2015-10-01 DIAGNOSIS — M25512 Pain in left shoulder: Secondary | ICD-10-CM | POA: Diagnosis not present

## 2015-10-01 DIAGNOSIS — E785 Hyperlipidemia, unspecified: Secondary | ICD-10-CM | POA: Diagnosis not present

## 2015-10-01 DIAGNOSIS — I10 Essential (primary) hypertension: Secondary | ICD-10-CM | POA: Diagnosis not present

## 2015-10-01 DIAGNOSIS — M25519 Pain in unspecified shoulder: Secondary | ICD-10-CM | POA: Insufficient documentation

## 2015-10-01 LAB — CBC WITH DIFFERENTIAL/PLATELET
Basophils Absolute: 0 10*3/uL (ref 0.0–0.1)
Basophils Relative: 1 % (ref 0–1)
EOS ABS: 0 10*3/uL (ref 0.0–0.7)
EOS PCT: 1 % (ref 0–5)
HEMATOCRIT: 41 % (ref 36.0–46.0)
Hemoglobin: 13.9 g/dL (ref 12.0–15.0)
LYMPHS PCT: 43 % (ref 12–46)
Lymphs Abs: 1.9 10*3/uL (ref 0.7–4.0)
MCH: 30.2 pg (ref 26.0–34.0)
MCHC: 33.9 g/dL (ref 30.0–36.0)
MCV: 89.1 fL (ref 78.0–100.0)
MONO ABS: 0.4 10*3/uL (ref 0.1–1.0)
MPV: 10.2 fL (ref 8.6–12.4)
Monocytes Relative: 8 % (ref 3–12)
Neutro Abs: 2.1 10*3/uL (ref 1.7–7.7)
Neutrophils Relative %: 47 % (ref 43–77)
PLATELETS: 212 10*3/uL (ref 150–400)
RBC: 4.6 MIL/uL (ref 3.87–5.11)
RDW: 14.4 % (ref 11.5–15.5)
WBC: 4.5 10*3/uL (ref 4.0–10.5)

## 2015-10-01 LAB — COMPREHENSIVE METABOLIC PANEL
ALT: 23 U/L (ref 6–29)
AST: 20 U/L (ref 10–35)
Albumin: 3.8 g/dL (ref 3.6–5.1)
Alkaline Phosphatase: 112 U/L (ref 33–130)
BILIRUBIN TOTAL: 0.4 mg/dL (ref 0.2–1.2)
BUN: 13 mg/dL (ref 7–25)
CALCIUM: 8.9 mg/dL (ref 8.6–10.4)
CO2: 26 mmol/L (ref 20–31)
Chloride: 100 mmol/L (ref 98–110)
Creat: 0.69 mg/dL (ref 0.60–0.93)
GLUCOSE: 75 mg/dL (ref 70–99)
POTASSIUM: 4.3 mmol/L (ref 3.5–5.3)
Sodium: 138 mmol/L (ref 135–146)
Total Protein: 7 g/dL (ref 6.1–8.1)

## 2015-10-01 LAB — LIPID PANEL
CHOL/HDL RATIO: 2.8 ratio (ref <=5.0)
Cholesterol: 265 mg/dL — ABNORMAL HIGH (ref 125–200)
HDL: 93 mg/dL (ref >=46)
LDL CALC: 153 mg/dL — AB (ref <130)
Triglycerides: 97 mg/dL (ref <150)
VLDL: 19 mg/dL (ref <30)

## 2015-10-01 MED ORDER — ATENOLOL 50 MG PO TABS: 50.0000 mg | ORAL_TABLET | Freq: Every day | ORAL | Status: DC

## 2015-10-01 NOTE — Patient Instructions (Addendum)
Orthopedics for your shoulder  We will call with lab results  Neurology appt for 1 visit  F/U 4 months

## 2015-10-01 NOTE — Telephone Encounter (Signed)
Refill appropriate and filled per protocol. 

## 2015-10-02 ENCOUNTER — Encounter: Payer: Self-pay | Admitting: Family Medicine

## 2015-10-02 NOTE — Progress Notes (Signed)
Patient ID: Molly Patel, female   DOB: May 24, 1946, 70 y.o.   MRN: [id number]   Subjective:    Patient ID: Molly Patel, female    DOB: 08-15-1946, 70 y.o.   MRN: [id number]  Patient presents for 4 month F/U  here for follow-up. Since her last visit she states that her leg cramps in some of her pains in her arms have improved since she stopped the statin drug and she refuses going back on the medication. But she is now having worsening shoulder pain in the right side she has pain when she lifts it up above her  Head to get dressed to do her hair. Initially the pain was in the left side numbness in the right side also across the collarbone area. Honestly the store was very confusing own where she was having issues. She states it only started after the stroke but this is not correlate with the stroke seen on the MRI. She also had normal CK no illness of any rhabdo and I'll see no significant improvement with stopping the statin drug that I can see.    she is being scheduled to see neurology she agrees to follow-up to have them review her scans and her vascular supply. She is taking all the other medications as prescribed.    Review Of Systems:  GEN- denies fatigue, fever, weight loss,weakness, recent illness HEENT- denies eye drainage, change in vision, nasal discharge, CVS- denies chest pain, palpitations RESP- denies SOB, cough, wheeze ABD- denies N/V, change in stools, abd pain GU- denies dysuria, hematuria, dribbling, incontinence MSK- +joint pain, muscle aches, injury Neuro- denies headache, dizziness, syncope, seizure activity       Objective:    BP 128/68 mmHg  Pulse 64  Temp(Src) 98 F (36.7 C) (Oral)  Resp 18  Ht 5\' 7"  (1.702 m)  Wt 171 lb (77.565 kg)  BMI 26.78 kg/m2 GEN- NAD, alert and oriented x3 HEENT- PERRL, EOMI, non injected sclera, pink conjunctiva, MMM, oropharynx clear,  CVS- RRR, no murmur RESP-CTAB Neuro-CNII-XII in tact, slow gait, RUE decreased strength  today to right today MSK- Fair ROM upper ext- , normal tone, rotator cuff intact fair ROM Neck EXT- No edema Pulses- Radial 2+        Assessment & Plan:      Problem List Items Addressed This Visit    Pain in joint, shoulder region She has fleeting joint pains, send to ortho for evaluation possible injection in right shouder, she likley has OA in joints, possible Rotator cuff syndrome    Hypertension - Primary    Well controlled, no changes      Relevant Medications   atenolol (TENORMIN) 50 MG tablet   Other Relevant Orders   CBC with Differential/Platelet (Completed)   Comprehensive metabolic panel (Completed)   Hyperlipidemia    D/C statins, consider zetia, though no true risk reduction with this She will see neurology for evaluation Continue ASA      Relevant Medications   atenolol (TENORMIN) 50 MG tablet   Other Relevant Orders   Lipid panel (Completed)      Note: This dictation was prepared with Dragon dictation along with smaller phrase technology. Any transcriptional errors that result from this process are unintentional.

## 2015-10-02 NOTE — Assessment & Plan Note (Signed)
D/C statins, consider zetia, though no true risk reduction with this She will see neurology for evaluation Continue ASA

## 2015-10-02 NOTE — Assessment & Plan Note (Signed)
Well controlled, no changes 

## 2015-10-03 ENCOUNTER — Telehealth: Payer: Self-pay | Admitting: Family Medicine

## 2015-10-03 NOTE — Telephone Encounter (Signed)
Pt states that the issue that she was having has resolved and she would like to cancel her referral to a therapist.

## 2015-10-04 ENCOUNTER — Other Ambulatory Visit: Payer: Self-pay | Admitting: *Deleted

## 2015-10-04 MED ORDER — EZETIMIBE 10 MG PO TABS: 10.0000 mg | ORAL_TABLET | Freq: Every day | ORAL | Status: DC

## 2015-10-04 NOTE — Telephone Encounter (Signed)
Noted, referral cancelled, please see referral note

## 2015-10-13 ENCOUNTER — Other Ambulatory Visit: Payer: Self-pay | Admitting: Family Medicine

## 2015-10-15 NOTE — Telephone Encounter (Signed)
Refill appropriate and filled per protocol. 

## 2015-10-18 NOTE — Telephone Encounter (Signed)
Please have pt come see NP for these problems and she can keep her [facility] appointment for the CT.  Thanks!

## 2015-10-18 NOTE — Telephone Encounter (Signed)
Pt has r/s her June appt to the end of May. When I called her to r/s her CT scan so it would be prior to her appt with [facility], she states that she feels like she needs to be seen before then because she is "flowing real bad" and having some stomach bloating/distention. Please advise.    Currently her CT scan is 01/25/16 and her appt with Sterrett is 01/17/16.

## 2015-10-31 NOTE — Telephone Encounter (Signed)
Left message x 2 for pt to call back. She did move her CT scan to just prior to her new appt time with Dr. Myrna Blazer.

## 2015-11-27 ENCOUNTER — Encounter: Attending: Internal Medicine | Primary: Community Health

## 2015-12-18 ENCOUNTER — Encounter: Payer: Self-pay | Admitting: Family Medicine

## 2015-12-19 ENCOUNTER — Ambulatory Visit: Payer: Medicare PPO | Admitting: Family Medicine

## 2015-12-21 ENCOUNTER — Telehealth: Payer: Self-pay | Admitting: Family Medicine

## 2015-12-21 MED ORDER — HYDROCHLOROTHIAZIDE 25 MG PO TABS: 12.5000 mg | ORAL_TABLET | Freq: Every day | ORAL | Status: DC

## 2015-12-21 NOTE — Telephone Encounter (Signed)
Prescription sent to pharmacy.

## 2015-12-21 NOTE — Telephone Encounter (Signed)
Pt would like a refill of HCTZ  25 mg called in to [facility]. She has an appointment with Dr. Buelah Manis on Monday, but is out of pills and needs enough to get her through the weekend.

## 2015-12-24 ENCOUNTER — Encounter: Payer: Self-pay | Admitting: Family Medicine

## 2015-12-24 ENCOUNTER — Ambulatory Visit (INDEPENDENT_AMBULATORY_CARE_PROVIDER_SITE_OTHER): Payer: Medicare PPO | Admitting: Family Medicine

## 2015-12-24 VITALS — BP 136/72 | HR 74 | Temp 98.3°F | Resp 16 | Ht 67.0 in | Wt 170.0 lb

## 2015-12-24 DIAGNOSIS — E785 Hyperlipidemia, unspecified: Secondary | ICD-10-CM

## 2015-12-24 DIAGNOSIS — R600 Localized edema: Secondary | ICD-10-CM

## 2015-12-24 DIAGNOSIS — I10 Essential (primary) hypertension: Secondary | ICD-10-CM | POA: Diagnosis not present

## 2015-12-24 NOTE — Assessment & Plan Note (Signed)
Blood pressure looks okay today she will continue the atenolol. HCTZ 25 mg. With regards to the small swelling above her head think this is cystic lesion. It is not connected with her craniotomy scar. Since it does resolve and then come back can just monitor for now she does not want any surgical intervention.

## 2015-12-24 NOTE — Patient Instructions (Addendum)
Increase to 1 tablet of the Hydrochloroathizide 25mg   - the pink one  Elevate your feet  Return next month for labs  Keep F/U June

## 2015-12-24 NOTE — Progress Notes (Signed)
Patient ID: Molly Patel, female   DOB: [birth year], 70 y.o.   MRN: [id number]    Subjective:    Patient ID: Molly Patel, female    DOB: 11/30/1945, 70 y.o.   MRN: [id number]  Patient presents for Edema Patient here with swelling in bilateral ankles for the past month. She is currently on hydrochlorothiazide 12.5 mg and atenolol for her blood pressure. She did have echocardiogram done in 2012 which showed mild LVH otherwise unremarkable  States that she's been helping her son's wife who's been severely weak. She's been driving 2 hours a day to help care for her she is up on her feet all day long the past few weeks she noticed swelling and throat ankles and her blood pressure also been elevated. She states she not been taking care of herself as she was taking care of the family member.   She also notices a small knot popped up on the left side of her head it goes up and goes down this is the same side that she had her craniotomy scar from her meningioma. She denies any trauma to the area.Denies any pain from area   She has not started the Zetia  she wanted to work on her diet.    Review Of Systems:  GEN- denies fatigue, fever, weight loss,weakness, recent illness HEENT- denies eye drainage, change in vision, nasal discharge, CVS- denies chest pain, palpitations RESP- denies SOB, cough, wheeze ABD- denies N/V, change in stools, abd pain GU- denies dysuria, hematuria, dribbling, incontinence MSK- denies joint pain, muscle aches, injury Neuro- denies headache, dizziness, syncope, seizure activity       Objective:    BP 136/72 mmHg  Pulse 74  Temp(Src) 98.3 F (36.8 C) (Oral)  Resp 16  Ht 5\' 7"  (1.702 m)  Wt 170 lb (77.111 kg)  BMI 26.62 kg/m2 GEN- NAD, alert and oriented x3 HEENT- PERRL, EOMI, non injected sclera, pink conjunctiva, MMM, oropharynx clear Neck- Supple, no JVD  CVS- RRR, no murmur RESP-CTAB Skin- above Left brow- small cystic like lesion, NT, no erythema   EXT-pedal edema, neg homans, no calf swelling or tenderness  Pulses- Radial, DP- 2+         Assessment & Plan:      Problem List Items Addressed This Visit    Pedal edema    I believe her edema is worsened secondary to staining on her feet and excessive driving. She does not have any calf tenderness. I think this is more dependent edema. We will increase her hydrochlorothiazide back to 25 mg once a day. Also have her elevate her feet.  If she was also given a renewal on her handicap placard       Hypertension    Blood pressure looks okay today she will continue the atenolol. HCTZ 25 mg. With regards to the small swelling above her head think this is cystic lesion. It is not connected with her craniotomy scar. Since it does resolve and then come back can just monitor for now she does not want any surgical intervention.      Hyperlipidemia - Primary    She declined medications in the past. She will follow up next month for her routine fasting labs to continue to try to work on her dietary changes. Discussed importance of her being on something to lower her cholesterol statin would be preferable if she's had stroke based on her MRI she declines this.         Note:  This dictation was prepared with Dragon dictation along with smaller phrase technology. Any transcriptional errors that result from this process are unintentional.

## 2015-12-24 NOTE — Assessment & Plan Note (Addendum)
I believe her edema is worsened secondary to staining on her feet and excessive driving. She does not have any calf tenderness. I think this is more dependent edema. We will increase her hydrochlorothiazide back to 25 mg once a day. Also have her elevate her feet.  If she was also given a renewal on her handicap placard

## 2015-12-24 NOTE — Assessment & Plan Note (Signed)
She declined medications in the past. She will follow up next month for her routine fasting labs to continue to try to work on her dietary changes. Discussed importance of her being on something to lower her cholesterol statin would be preferable if she's had stroke based on her MRI she declines this.

## 2015-12-28 ENCOUNTER — Encounter: Attending: Internal Medicine | Primary: Community Health

## 2016-01-05 ENCOUNTER — Encounter: Payer: Self-pay | Admitting: Family Medicine

## 2016-01-07 ENCOUNTER — Telehealth

## 2016-01-07 NOTE — Telephone Encounter (Signed)
Radiology is requesting a stat creatinine to go with this pt's CT scan tomorrow.    Thanks!

## 2016-01-07 NOTE — Telephone Encounter (Signed)
Done

## 2016-01-08 ENCOUNTER — Ambulatory Visit (INDEPENDENT_AMBULATORY_CARE_PROVIDER_SITE_OTHER): Payer: Medicare PPO | Admitting: *Deleted

## 2016-01-08 ENCOUNTER — Telehealth: Payer: Self-pay | Admitting: *Deleted

## 2016-01-08 ENCOUNTER — Ambulatory Visit: Admit: 2016-01-08 | Discharge: 2016-01-08 | Payer: MEDICARE | Attending: Internal Medicine | Primary: Community Health

## 2016-01-08 DIAGNOSIS — I10 Essential (primary) hypertension: Secondary | ICD-10-CM

## 2016-01-08 DIAGNOSIS — Z23 Encounter for immunization: Secondary | ICD-10-CM

## 2016-01-08 DIAGNOSIS — Z111 Encounter for screening for respiratory tuberculosis: Secondary | ICD-10-CM | POA: Diagnosis not present

## 2016-01-08 MED ORDER — CITALOPRAM 20 MG TAB
20 mg | ORAL_TABLET | Freq: Every day | ORAL | 3 refills | Status: DC
Start: 2016-01-08 — End: 2017-10-30

## 2016-01-08 MED ORDER — SIMVASTATIN 20 MG TAB
20 mg | ORAL_TABLET | Freq: Every evening | ORAL | 3 refills | Status: DC
Start: 2016-01-08 — End: 2017-10-20

## 2016-01-08 NOTE — Progress Notes (Signed)
Cathy Jackson is a 70 y.o. WHITE OR CAUCASIAN female.    BP looks great. Due for labs today.   Last mammogram was earlier this year at [facility]. Had colonoscopy 5/16 but not sure when she needs repeat. Does not need Pap smears. Has never had a DEXA scan but wants to hold off on this until next apt.  She is not interested in flu or pneumonia vaccines.  Has h/o bladder cancer and has had cystectomy. Follows with Dr. Myrna Blazer.   She has no complaints today.     Past Medical History:   Diagnosis Date   ??? Abdominal pain, unspecified site 10/27/2012   ??? Arthritis     generalized   ??? Asthma 10/27/2012   ??? Bladder cancer ([place])    ??? Depression     managed with medication    ??? Dysuria 10/27/2012   ??? Environmental allergies     managed with medication    ??? GERD (gastroesophageal reflux disease) 10/27/2012   ??? History of kidney stones     states surgical removed   ??? Hypothyroidism     managed with medication    ??? Other "heavy-for-dates" infants     ureterectomy and ileal conduit   ??? Unspecified disorder of bladder 10/27/2012   ??? Urethral caruncle 10/27/2012   ??? Urinary frequency 10/27/2012   ??? Urinary tract infection, site not specified 10/27/2012     Past Surgical History:   Procedure Laterality Date   ??? CLOSE CYSTOSTOMY      cystoscopy/ turbt x 2   ??? HX BREAST BIOPSY Left 2000    x 2   ??? HX CESAREAN SECTION      x 1   ??? HX CHOLECYSTECTOMY     ??? HX TUBAL LIGATION     ??? HX WRIST FRACTURE TX Left      Social History     Social History   ??? Marital status: MARRIED     Spouse name: N/A   ??? Number of children: N/A   ??? Years of education: N/A     Occupational History   ??? Not on file.     Social History Main Topics   ??? Smoking status: Former Smoker     Packs/day: 0.50     Years: 20.00   ??? Smokeless tobacco: Never Used      Comment: stopped at age 88   ??? Alcohol use No   ??? Drug use: No   ??? Sexual activity: Not on file     Other Topics Concern   ??? Not on file     Social History Narrative     Family History   Problem Relation Age of Onset    ??? Stroke Mother    ??? Heart Disease Mother    ??? Cancer Father    ??? Asthma Father    ??? Lung Disease Father      COPD/smoker   ??? Cancer Brother    ??? Heart Disease Brother    ??? Alcohol abuse Brother      Current Outpatient Prescriptions   Medication Sig Dispense Refill   ??? ascorbic acid, vitamin C, (VITAMIN C) 1,000 mg tablet Take  by mouth.     ??? ferrous sulfate (IRON) 325 mg (65 mg iron) tablet Take  by mouth Daily (before breakfast).     ??? donepezil (ARICEPT) 5 mg tablet Take  by mouth nightly.     ??? simvastatin (ZOCOR) 20 mg tablet Take 1  Tab by mouth nightly. 90 Tab 3   ??? citalopram (CELEXA) 20 mg tablet Take 1 Tab by mouth daily. 90 Tab 3   ??? montelukast (SINGULAIR) 10 mg tablet      ??? lisinopril (PRINIVIL, ZESTRIL) 10 mg tablet   11   ??? levocetirizine (XYZAL) 5 mg tablet   11   ??? estradiol (ESTRACE) 0.01 % (0.1 mg/gram) vaginal cream Insert 2 g into vagina daily.     ??? meloxicam (MOBIC) 15 mg tablet Take 15 mg by mouth daily.     ??? traMADol (ULTRAM) 50 mg tablet Take 50 mg by mouth four (4) times daily.     ??? levothyroxine (SYNTHROID) 25 mcg tablet Take 25 mcg by mouth Daily (before breakfast). Indications: HYPOTHYROIDISM       No Known Allergies    Review of Systems   Constitutional: Negative.    HENT: Negative.    Eyes: Negative for blurred vision, pain and redness.        Wears glasses   Respiratory: Negative.    Cardiovascular: Negative.    Gastrointestinal: Positive for heartburn (occ).   Genitourinary:        Ostomy secondary to cystectomy   Musculoskeletal: Negative.    Skin: Negative for rash.   Neurological: Negative for dizziness, sensory change and focal weakness.   Endo/Heme/Allergies: Negative.    Psychiatric/Behavioral: Negative for depression and memory loss. The patient is not nervous/anxious and does not have insomnia.        Visit Vitals   ??? BP 122/83 (BP 1 Location: Left arm, BP Patient Position: Sitting)   ??? Pulse 84   ??? Temp 97.9 ??F (36.6 ??C) (Oral)   ??? Resp 20   ??? Ht 5\' 2"  (1.575 m)    ??? Wt 138 lb 9.6 oz (62.9 kg)   ??? BMI 25.35 kg/m2       Physical Exam   Constitutional: She is oriented to person, place, and time and well-developed, well-nourished, and in no distress.   HENT:   Head: Normocephalic and atraumatic.   Right Ear: External ear normal.   Left Ear: External ear normal.   Mouth/Throat: Oropharynx is clear and moist.   dentures   Eyes: Conjunctivae and EOM are normal. Pupils are equal, round, and reactive to light.   Neck: Normal range of motion. Neck supple. No JVD present. No tracheal deviation present. No thyromegaly present.   Cardiovascular: Normal rate, regular rhythm and normal heart sounds.    Pulmonary/Chest: Effort normal and breath sounds normal.   Genitourinary:   Genitourinary Comments: Ostomy bag in place right lower abdomen   Musculoskeletal: Normal range of motion. She exhibits no edema or tenderness.   Lymphadenopathy:     She has no cervical adenopathy.   Neurological: She is alert and oriented to person, place, and time. Gait normal.   Skin: Skin is warm and dry. No rash noted.   Psychiatric: Mood, memory, affect and judgment normal.       Assessment and Plan    1. Malignant neoplasm of urinary bladder, unspecified site ([place])  STABLE--SHE FOLLOWS WITH DR. STERRETT    2. GERD without esophagitis  CURRENTLY ON NO PRESCRIPTION MEDICATION    3. Acquired hypothyroidism  CHECK TSH TODAY. CONTINUE LEVOTHYROXINE    4. Essential hypertension  GREAT CONTROL    5. Primary osteoarthritis involving multiple joints  CONTINUE MOBIC AND TRAMADOL    6. Dysthymia  CONTINUE CELEXA    7. Mild persistent asthma without complication  CONTINUE SINGULAIR    8. Mixed hyperlipidemia  CONTINUE ZOCOR. CHECK LIPIDS TODAY    9. Perennial allergic rhinitis, unspecified allergic rhinitis trigger  CONTINUE FLONASE, XYZAL    10. Dementia  CONTINUE NAMENDA

## 2016-01-08 NOTE — Telephone Encounter (Signed)
Received [place] for patient.   MD reviewed and noted that patient will require PPD and Tdap.  Call placed to patient. [place].

## 2016-01-08 NOTE — Telephone Encounter (Signed)
Patient returned call and made aware.   Patient will come in for nurse visit.

## 2016-01-08 NOTE — Progress Notes (Signed)
Patient ID: Molly Patel, female   DOB: 12/02/1945, 70 y.o.   MRN: [id number]  Patient seen in office to update immunizations and receive PPD testing.   Patient is going to work for [employer] in [employer].   Tolerated administration well.   Advised to return on 01/10/2016 to have PPD read.

## 2016-01-09 ENCOUNTER — Inpatient Hospital Stay: Admit: 2016-01-09 | Payer: MEDICARE | Attending: Urology | Primary: Community Health

## 2016-01-09 DIAGNOSIS — C679 Malignant neoplasm of bladder, unspecified: Secondary | ICD-10-CM

## 2016-01-09 LAB — METABOLIC PANEL, COMPREHENSIVE
A-G Ratio: 1.8 (ref 1.2–2.2)
ALT (SGPT): 8 IU/L (ref 0–32)
AST (SGOT): 11 IU/L (ref 0–40)
Albumin: 4.4 g/dL (ref 3.5–4.8)
Alk. phosphatase: 74 IU/L (ref 39–117)
BUN/Creatinine ratio: 21 (ref 12–28)
BUN: 20 mg/dL (ref 8–27)
Bilirubin, total: 0.2 mg/dL (ref 0.0–1.2)
CO2: 27 mmol/L (ref 18–29)
Calcium: 9.3 mg/dL (ref 8.7–10.3)
Chloride: 102 mmol/L (ref 96–106)
Creatinine: 0.94 mg/dL (ref 0.57–1.00)
GFR est AA: 70 mL/min/{1.73_m2} (ref >59)
GFR est non-AA: 61 mL/min/{1.73_m2} (ref >59)
GLOBULIN, TOTAL: 2.4 g/dL (ref 1.5–4.5)
Glucose: 106 mg/dL — ABNORMAL HIGH (ref 65–99)
Potassium: 5.1 mmol/L (ref 3.5–5.2)
Protein, total: 6.8 g/dL (ref 6.0–8.5)
Sodium: 145 mmol/L — ABNORMAL HIGH (ref 134–144)

## 2016-01-09 LAB — LIPID PANEL WITH LDL/HDL RATIO
Cholesterol, total: 183 mg/dL (ref 100–199)
HDL Cholesterol: 63 mg/dL (ref >39)
LDL, calculated: 97 mg/dL (ref 0–99)
LDL/HDL Ratio: 1.5 ratio units (ref 0.0–3.2)
Triglyceride: 115 mg/dL (ref 0–149)
VLDL, calculated: 23 mg/dL (ref 5–40)

## 2016-01-09 LAB — CBC WITH AUTOMATED DIFF
ABS. BASOPHILS: 0 10*3/uL (ref 0.0–0.2)
ABS. EOSINOPHILS: 0.2 10*3/uL (ref 0.0–0.4)
ABS. IMM. GRANS.: 0 10*3/uL (ref 0.0–0.1)
ABS. MONOCYTES: 0.7 10*3/uL (ref 0.1–0.9)
ABS. NEUTROPHILS: 6.9 10*3/uL (ref 1.4–7.0)
Abs Lymphocytes: 2.3 10*3/uL (ref 0.7–3.1)
BASOPHILS: 0 %
EOSINOPHILS: 2 %
HCT: 45.7 % (ref 34.0–46.6)
HGB: 14.6 g/dL (ref 11.1–15.9)
IMMATURE GRANULOCYTES: 0 %
Lymphocytes: 23 %
MCH: 30.5 pg (ref 26.6–33.0)
MCHC: 31.9 g/dL (ref 31.5–35.7)
MCV: 96 fL (ref 79–97)
MONOCYTES: 7 %
NEUTROPHILS: 68 %
PLATELET: 247 10*3/uL (ref 150–379)
RBC: 4.78 x10E6/uL (ref 3.77–5.28)
RDW: 13.1 % (ref 12.3–15.4)
WBC: 10 10*3/uL (ref 3.4–10.8)

## 2016-01-09 LAB — TSH AND FREE T4
T4, Free: 1.1 ng/dL (ref 0.82–1.77)
TSH: 0.793 u[IU]/mL (ref 0.450–4.500)

## 2016-01-09 LAB — MRI/CT POC CREATININE
Creatinine (POC): 0.8 MG/DL (ref 0.6–1.0)
GFRAA, POC: >60 mL/min/{1.73_m2} (ref >60)
GFRNA, POC: >60 mL/min/{1.73_m2} (ref >60)

## 2016-01-09 MED ORDER — IOPAMIDOL 76 % IV SOLN
370 mg iodine /mL (76 %) | Freq: Once | INTRAVENOUS | Status: AC
Start: 2016-01-09 — End: 2016-01-09
  Administered 2016-01-09: 15:00:00 via INTRAVENOUS

## 2016-01-09 MED ORDER — SALINE PERIPHERAL FLUSH PRN
Freq: Once | INTRAMUSCULAR | Status: AC
Start: 2016-01-09 — End: 2016-01-09
  Administered 2016-01-09: 15:00:00

## 2016-01-09 MED ORDER — DIATRIZOATE MEGLUMINE & SODIUM 66 %-10 % ORAL SOLN
66-10 % | Freq: Once | ORAL | Status: AC
Start: 2016-01-09 — End: 2016-01-09
  Administered 2016-01-09: 15:00:00 via ORAL

## 2016-01-09 MED ORDER — SODIUM CHLORIDE 0.9% BOLUS IV
0.9 % | Freq: Once | INTRAVENOUS | Status: AC
Start: 2016-01-09 — End: 2016-01-09
  Administered 2016-01-09: 15:00:00 via INTRAVENOUS

## 2016-01-09 NOTE — Progress Notes (Signed)
Please let her know that her labs look great. Thanks!

## 2016-01-09 NOTE — Progress Notes (Signed)
Left message for patient to return my call.

## 2016-01-10 ENCOUNTER — Ambulatory Visit: Payer: Medicare PPO | Admitting: *Deleted

## 2016-01-10 ENCOUNTER — Encounter: Admit: 2016-01-10 | Discharge: 2016-01-10 | Payer: MEDICARE | Primary: Community Health

## 2016-01-10 DIAGNOSIS — C679 Malignant neoplasm of bladder, unspecified: Secondary | ICD-10-CM

## 2016-01-10 DIAGNOSIS — Z111 Encounter for screening for respiratory tuberculosis: Secondary | ICD-10-CM

## 2016-01-10 LAB — TB SKIN TEST
Induration: 0 mm
TB Skin Test: NEGATIVE

## 2016-01-10 NOTE — Progress Notes (Signed)
Patient called back; notified of results and verbalized understanding.

## 2016-01-10 NOTE — Progress Notes (Signed)
Left message again for patient to return my call.

## 2016-01-10 NOTE — Progress Notes (Signed)
Patient ID: Molly Patel, female   DOB: Jan 11, 1946, 70 y.o.   MRN: [id number]  Patient seen in office to have PPD read.   No redness or induration noted.   PPD read as negative.

## 2016-01-11 LAB — METABOLIC PANEL, BASIC
BUN/Creatinine ratio: 18 (ref 12–28)
BUN: 14 mg/dL (ref 8–27)
CO2: 24 mmol/L (ref 18–29)
Calcium: 9.2 mg/dL (ref 8.7–10.3)
Chloride: 98 mmol/L (ref 96–106)
Creatinine: 0.76 mg/dL (ref 0.57–1.00)
GFR est AA: 91 mL/min/{1.73_m2} (ref >59)
GFR est non-AA: 79 mL/min/{1.73_m2} (ref >59)
Glucose: 149 mg/dL — ABNORMAL HIGH (ref 65–99)
Potassium: 4.9 mmol/L (ref 3.5–5.2)
Sodium: 138 mmol/L (ref 134–144)

## 2016-01-17 ENCOUNTER — Ambulatory Visit: Admit: 2016-01-17 | Discharge: 2016-01-17 | Payer: MEDICARE | Attending: Urology | Primary: Community Health

## 2016-01-17 DIAGNOSIS — C679 Malignant neoplasm of bladder, unspecified: Secondary | ICD-10-CM

## 2016-01-17 NOTE — Progress Notes (Signed)
[facility]  [address] [place] [address]  [phone]    Cathy Jackson  DOB: 19-Aug-1946     HPI   70 y.o., female returns in follow up for CaB. S/P anterior pelvic exonteration, B PLND and ileal conduit urinary diversion on 10/13/12. Final path was TCC T1G3NO with neg margins. She has done well post op.  CMP is unremarkable on 01/08/16.  Cr was 0.94.  Reports stable IH/parastomal hernia.  CT C/A/P on 01/09/16 shows a stable ventral hernia and stable post inflammatory R lung changes.  No obvious recurrence of her disease noted.  No new complaints.      Past Medical History:   Diagnosis Date   ??? Abdominal pain, unspecified site 10/27/2012   ??? Arthritis     generalized   ??? Asthma 10/27/2012   ??? Bladder cancer ([place])    ??? Depression     managed with medication    ??? Dysuria 10/27/2012   ??? Environmental allergies     managed with medication    ??? GERD (gastroesophageal reflux disease) 10/27/2012   ??? History of kidney stones     states surgical removed   ??? Hypothyroidism     managed with medication    ??? Other "heavy-for-dates" infants     ureterectomy and ileal conduit   ??? Unspecified disorder of bladder 10/27/2012   ??? Urethral caruncle 10/27/2012   ??? Urinary frequency 10/27/2012   ??? Urinary tract infection, site not specified 10/27/2012     Past Surgical History:   Procedure Laterality Date   ??? CLOSE CYSTOSTOMY      cystoscopy/ turbt x 2   ??? HX BREAST BIOPSY Left 2000    x 2   ??? HX CESAREAN SECTION      x 1   ??? HX CHOLECYSTECTOMY     ??? HX TUBAL LIGATION     ??? HX WRIST FRACTURE TX Left      Current Outpatient Prescriptions   Medication Sig Dispense Refill   ??? calcium carbonate (OS-CAL) 500 mg calcium (1,250 mg) tablet Take  by mouth daily.     ??? ascorbic acid, vitamin C, (VITAMIN C) 1,000 mg tablet Take  by mouth.     ??? ferrous sulfate (IRON) 325 mg (65 mg iron) tablet Take  by mouth Daily (before breakfast).     ??? donepezil (ARICEPT) 5 mg tablet Take  by mouth nightly.      ??? simvastatin (ZOCOR) 20 mg tablet Take 1 Tab by mouth nightly. 90 Tab 3   ??? citalopram (CELEXA) 20 mg tablet Take 1 Tab by mouth daily. 90 Tab 3   ??? lisinopril (PRINIVIL, ZESTRIL) 10 mg tablet   11   ??? levocetirizine (XYZAL) 5 mg tablet   11   ??? estradiol (ESTRACE) 0.01 % (0.1 mg/gram) vaginal cream Insert 2 g into vagina daily.     ??? meloxicam (MOBIC) 15 mg tablet Take 15 mg by mouth daily.     ??? traMADol (ULTRAM) 50 mg tablet Take 50 mg by mouth four (4) times daily.     ??? levothyroxine (SYNTHROID) 25 mcg tablet Take 25 mcg by mouth Daily (before breakfast). Indications: HYPOTHYROIDISM       No Known Allergies  Social History     Social History   ??? Marital status: MARRIED     Spouse name: N/A   ??? Number of children: N/A   ??? Years of education: N/A     Occupational  History   ??? Not on file.     Social History Main Topics   ??? Smoking status: Former Smoker     Packs/day: 0.50     Years: 20.00   ??? Smokeless tobacco: Never Used      Comment: stopped at age 74   ??? Alcohol use No   ??? Drug use: No   ??? Sexual activity: Not on file     Other Topics Concern   ??? Not on file     Social History Narrative     Family History   Problem Relation Age of Onset   ??? Stroke Mother    ??? Heart Disease Mother    ??? Cancer Father    ??? Asthma Father    ??? Lung Disease Father      COPD/smoker   ??? Cancer Brother    ??? Heart Disease Brother    ??? Alcohol abuse Brother        Review of Systems  All systems reviewed and are negative at this time.    Physical Exam  Visit Vitals   ??? BP 102/65   ??? Pulse 86   ??? Temp 98.8 ??F (37.1 ??C) (Tympanic)   ??? Ht 5\' 2"  (1.575 m)   ??? Wt 140 lb (63.5 kg)   ??? BMI 25.61 kg/m2     General appearance - alert, well appearing, and in no distress  Mental status - alert, oriented to person, place, and time  Eyes - extraocular eye movements intact, sclera anicteric  Abdomen - soft, nontender, nondistended, no masses or organomegaly.  Reducible IH.  Urostomy pink and viable.   Neurological -  normal speech, no focal findings or movement disorder noted  Skin - normal coloration and turgor        Assessment/Plan    ICD-10-CM ICD-9-CM    1. Malignant neoplasm of urinary bladder, unspecified site ([facility]) C67.9 188.9 CT CHEST ABD PELV W CONT   2. Parastomal hernia without obstruction or gangrene K43.5 569.69    3. Incisional hernia, without obstruction or gangrene K43.2 553.21      I again offered a general surgery referral for her hernia.  She declined.  Signs/symptoms of incarceration/obst reviewed.  RTO in 12 mo with repeat CT prior.    Dione Booze Hjalmer Iovino, DO

## 2016-01-24 ENCOUNTER — Encounter: Primary: Community Health

## 2016-01-25 ENCOUNTER — Ambulatory Visit: Payer: MEDICARE | Primary: Community Health

## 2016-01-29 ENCOUNTER — Ambulatory Visit (INDEPENDENT_AMBULATORY_CARE_PROVIDER_SITE_OTHER): Payer: Medicare PPO | Admitting: Family Medicine

## 2016-01-29 ENCOUNTER — Encounter: Payer: Self-pay | Admitting: Family Medicine

## 2016-01-29 VITALS — BP 128/70 | HR 64 | Temp 97.9°F | Resp 12 | Ht 67.0 in | Wt 167.0 lb

## 2016-01-29 DIAGNOSIS — R600 Localized edema: Secondary | ICD-10-CM

## 2016-01-29 DIAGNOSIS — I10 Essential (primary) hypertension: Secondary | ICD-10-CM | POA: Diagnosis not present

## 2016-01-29 DIAGNOSIS — E785 Hyperlipidemia, unspecified: Secondary | ICD-10-CM

## 2016-01-29 MED ORDER — FUROSEMIDE 40 MG PO TABS: 40.0000 mg | ORAL_TABLET | Freq: Every day | ORAL | Status: DC | PRN

## 2016-01-29 NOTE — Assessment & Plan Note (Signed)
History of stroke in 2016, continue to recommend statin drug to help with her risk factors. She was to see what her levels look like today. States she's been working on her diet. Tried to explain to her that diet alone does not enough for secondary prevention of another stroke.

## 2016-01-29 NOTE — Assessment & Plan Note (Signed)
Blood pressure well controlled and change medication 

## 2016-01-29 NOTE — Assessment & Plan Note (Signed)
I will DC the hydrochlorothiazide and give her Lasix 40 mg she can use this as needed for swelling

## 2016-01-29 NOTE — Patient Instructions (Signed)
New water pill Lasix, you can take a few times a week to help with swelling We will call with lab results  F/U 4 Months

## 2016-01-29 NOTE — Progress Notes (Signed)
Patient ID: Molly Patel, female   DOB: 02-10-1946, 70 y.o.   MRN: [id number]    Subjective:    Patient ID: Molly Patel, female    DOB: 06/25/1946, 70 y.o.   MRN: [id number]  Patient presents for 4 month F/U Patient here for interim follow-up. Her last visit her HCTZ  was increased to 25 mg that she had increased peripheral edema. She states it works, but she still has residual edema at her ankles. Her blood pressure looks good her weight is down 3 pounds since her last visit. She is due for her cholesterol she's been very reluctant to start any statin drugs despite having a cerebrovascular event. Meds reviewed    Review Of Systems:  GEN- denies fatigue, fever, weight loss,weakness, recent illness HEENT- denies eye drainage, change in vision, nasal discharge, CVS- denies chest pain, palpitations RESP- denies SOB, cough, wheeze ABD- denies N/V, change in stools, abd pain Neuro- denies headache, dizziness, syncope, seizure activity       Objective:    BP 128/70 mmHg  Pulse 64  Temp(Src) 97.9 F (36.6 C) (Oral)  Resp 12  Ht 5\' 7"  (1.702 m)  Wt 167 lb (75.751 kg)  BMI 26.15 kg/m2 GEN- NAD, alert and oriented x3 HEENT- PERRL, EOMI, non injected sclera, pink conjunctiva, MMM, oropharynx clear Neck- Supple, no thyromegaly CVS- RRR, no murmur RESP-CTAB EXT- pedal edema bilat  Pulses- Radial, DP- 2+        Assessment & Plan:      Problem List Items Addressed This Visit    Pedal edema    I will DC the hydrochlorothiazide and give her Lasix 40 mg she can use this as needed for swelling      Hypertension    Blood pressure well controlled and change medication      Relevant Medications   furosemide (LASIX) 40 MG tablet   Other Relevant Orders   CBC with Differential/Platelet   Comprehensive metabolic panel   Hyperlipidemia - Primary    History of stroke in 2016, continue to recommend statin drug to help with her risk factors. She was to see what her levels look  like today. States she's been working on her diet. Tried to explain to her that diet alone does not enough for secondary prevention of another stroke.      Relevant Medications   furosemide (LASIX) 40 MG tablet   Other Relevant Orders   Lipid panel      Note: This dictation was prepared with Dragon dictation along with smaller phrase technology. Any transcriptional errors that result from this process are unintentional.

## 2016-01-30 ENCOUNTER — Other Ambulatory Visit (HOSPITAL_COMMUNITY)
Admission: RE | Admit: 2016-01-30 | Discharge: 2016-01-30 | Disposition: A | Discharge status: Home / Self Care | Payer: Medicare PPO | Source: Ambulatory Visit | Attending: Family Medicine | Admitting: Family Medicine

## 2016-01-30 DIAGNOSIS — E785 Hyperlipidemia, unspecified: Secondary | ICD-10-CM | POA: Diagnosis not present

## 2016-01-30 LAB — COMPREHENSIVE METABOLIC PANEL
ALK PHOS: 110 U/L (ref 38–126)
ALT: 32 U/L (ref 14–54)
AST: 25 U/L (ref 15–41)
Albumin: 4.2 g/dL (ref 3.5–5.0)
Anion gap: 6 (ref 5–15)
BUN: 16 mg/dL (ref 6–20)
CALCIUM: 9.3 mg/dL (ref 8.9–10.3)
CHLORIDE: 102 mmol/L (ref 101–111)
CO2: 30 mmol/L (ref 22–32)
CREATININE: 0.64 mg/dL (ref 0.44–1.00)
Glucose, Bld: 113 mg/dL — ABNORMAL HIGH (ref 65–99)
Potassium: 3.8 mmol/L (ref 3.5–5.1)
Sodium: 138 mmol/L (ref 135–145)
Total Bilirubin: 0.4 mg/dL (ref 0.3–1.2)
Total Protein: 7.5 g/dL (ref 6.5–8.1)

## 2016-01-30 LAB — CBC WITH DIFFERENTIAL/PLATELET
BASOS ABS: 0 10*3/uL (ref 0.0–0.1)
Basophils Relative: 1 %
Eosinophils Absolute: 0.1 10*3/uL (ref 0.0–0.7)
Eosinophils Relative: 2 %
HCT: 41.8 % (ref 36.0–46.0)
HEMOGLOBIN: 13.6 g/dL (ref 12.0–15.0)
LYMPHS ABS: 1.5 10*3/uL (ref 0.7–4.0)
LYMPHS PCT: 34 %
MCH: 30.4 pg (ref 26.0–34.0)
MCHC: 32.5 g/dL (ref 30.0–36.0)
MCV: 93.5 fL (ref 78.0–100.0)
Monocytes Absolute: 0.4 10*3/uL (ref 0.1–1.0)
Monocytes Relative: 10 %
NEUTROS ABS: 2.3 10*3/uL (ref 1.7–7.7)
NEUTROS PCT: 53 %
PLATELETS: 174 10*3/uL (ref 150–400)
RBC: 4.47 MIL/uL (ref 3.87–5.11)
RDW: 13.7 % (ref 11.5–15.5)
WBC: 4.3 10*3/uL (ref 4.0–10.5)

## 2016-01-30 LAB — LIPID PANEL
CHOLESTEROL: 290 mg/dL — AB (ref 0–200)
HDL: 90 mg/dL (ref >40)
LDL CALC: 168 mg/dL — AB (ref 0–99)
TRIGLYCERIDES: 158 mg/dL — AB (ref <150)
Total CHOL/HDL Ratio: 3.2 RATIO
VLDL: 32 mg/dL (ref 0–40)

## 2016-01-31 ENCOUNTER — Encounter: Attending: Urology | Primary: Community Health

## 2016-02-01 ENCOUNTER — Other Ambulatory Visit: Payer: Self-pay | Admitting: *Deleted

## 2016-02-01 MED ORDER — ATORVASTATIN CALCIUM 10 MG PO TABS: 10.0000 mg | ORAL_TABLET | Freq: Every day | ORAL | Status: DC

## 2016-02-19 NOTE — Telephone Encounter (Signed)
Left message for patient to call the office to schedule/discuss Annual Wellness Visit.

## 2016-03-05 NOTE — Telephone Encounter (Signed)
Attempted to contact patient to schedule AWV but number is no longer in service per recording. No alternative number listed.

## 2016-03-10 ENCOUNTER — Other Ambulatory Visit: Payer: Self-pay | Admitting: Family Medicine

## 2016-04-21 DIAGNOSIS — H40013 Open angle with borderline findings, low risk, bilateral: Secondary | ICD-10-CM | POA: Diagnosis not present

## 2016-04-23 ENCOUNTER — Other Ambulatory Visit: Payer: Self-pay | Admitting: Family Medicine

## 2016-05-12 DIAGNOSIS — H40013 Open angle with borderline findings, low risk, bilateral: Secondary | ICD-10-CM | POA: Diagnosis not present

## 2016-06-04 ENCOUNTER — Ambulatory Visit (INDEPENDENT_AMBULATORY_CARE_PROVIDER_SITE_OTHER): Payer: Medicare PPO | Admitting: Family Medicine

## 2016-06-04 ENCOUNTER — Encounter: Payer: Self-pay | Admitting: Family Medicine

## 2016-06-04 VITALS — BP 128/74 | HR 80 | Temp 97.8°F | Resp 16 | Ht 67.0 in | Wt 171.0 lb

## 2016-06-04 DIAGNOSIS — I10 Essential (primary) hypertension: Secondary | ICD-10-CM | POA: Diagnosis not present

## 2016-06-04 DIAGNOSIS — R600 Localized edema: Secondary | ICD-10-CM | POA: Diagnosis not present

## 2016-06-04 DIAGNOSIS — E7800 Pure hypercholesterolemia, unspecified: Secondary | ICD-10-CM

## 2016-06-04 LAB — CBC WITH DIFFERENTIAL/PLATELET
BASOS PCT: 1 %
Basophils Absolute: 49 cells/uL (ref 0–200)
EOS PCT: 2 %
Eosinophils Absolute: 98 cells/uL (ref 15–500)
HCT: 41.2 % (ref 35.0–45.0)
HEMOGLOBIN: 13.8 g/dL (ref 12.0–15.0)
Lymphocytes Relative: 40 %
Lymphs Abs: 1960 cells/uL (ref 850–3900)
MCH: 30.8 pg (ref 27.0–33.0)
MCHC: 33.5 g/dL (ref 32.0–36.0)
MCV: 92 fL (ref 80.0–100.0)
MONO ABS: 539 {cells}/uL (ref 200–950)
MPV: 9.5 fL (ref 7.5–12.5)
Monocytes Relative: 11 %
Neutro Abs: 2254 cells/uL (ref 1500–7800)
Neutrophils Relative %: 46 %
PLATELETS: 176 10*3/uL (ref 140–400)
RBC: 4.48 MIL/uL (ref 3.80–5.10)
RDW: 13.7 % (ref 11.0–15.0)
WBC: 4.9 10*3/uL (ref 3.8–10.8)

## 2016-06-04 LAB — LIPID PANEL
Cholesterol: 210 mg/dL — ABNORMAL HIGH (ref 125–200)
HDL: 89 mg/dL (ref >=46)
LDL Cholesterol: 91 mg/dL (ref <130)
Total CHOL/HDL Ratio: 2.4 Ratio (ref <=5.0)
Triglycerides: 151 mg/dL — ABNORMAL HIGH (ref <150)
VLDL: 30 mg/dL (ref <30)

## 2016-06-04 LAB — COMPREHENSIVE METABOLIC PANEL
ALT: 31 U/L — ABNORMAL HIGH (ref 6–29)
AST: 26 U/L (ref 10–35)
Albumin: 3.9 g/dL (ref 3.6–5.1)
Alkaline Phosphatase: 118 U/L (ref 33–130)
BILIRUBIN TOTAL: 0.4 mg/dL (ref 0.2–1.2)
BUN: 11 mg/dL (ref 7–25)
CHLORIDE: 100 mmol/L (ref 98–110)
CO2: 27 mmol/L (ref 20–31)
CREATININE: 0.77 mg/dL (ref 0.60–0.93)
Calcium: 8.7 mg/dL (ref 8.6–10.4)
Glucose, Bld: 74 mg/dL (ref 70–99)
Potassium: 3.9 mmol/L (ref 3.5–5.3)
SODIUM: 137 mmol/L (ref 135–146)
TOTAL PROTEIN: 6.8 g/dL (ref 6.1–8.1)

## 2016-06-04 MED ORDER — FUROSEMIDE 40 MG PO TABS: ORAL_TABLET | ORAL | 2 refills | Status: DC

## 2016-06-04 NOTE — Assessment & Plan Note (Signed)
Again D/C HCTZ use lasix instead

## 2016-06-04 NOTE — Patient Instructions (Signed)
We will call with lab results F/U 4 months  

## 2016-06-04 NOTE — Assessment & Plan Note (Signed)
Well controlled, no changes, fasting labs today, on lipitor as prescribed for her lipids, history of CVA

## 2016-06-04 NOTE — Progress Notes (Signed)
   Subjective:    Patient ID: Molly Patel, female    DOB: 1946-08-21, 70 y.o.   MRN: [id number]  Patient presents for 4 month F/U (is fasting)  Pt here to f/u chronic medical problems No concerns Medications reviewed She is taking the Lasix almost daily nystatin keeping her fluid levels down. He states that she then started taking the hydrochlorothiazide back again as well. She is now being followed by [facility]    Review Of Systems:  GEN- denies fatigue, fever, weight loss,weakness, recent illness HEENT- denies eye drainage, change in vision, nasal discharge, CVS- denies chest pain, palpitations RESP- denies SOB, cough, wheeze ABD- denies N/V, change in stools, abd pain GU- denies dysuria, hematuria, dribbling, incontinence MSK- denies joint pain, muscle aches, injury Neuro- denies headache, dizziness, syncope, seizure activity       Objective:    BP 128/74 (BP Location: Left Arm, Patient Position: Sitting, Cuff Size: Normal)   Pulse 80   Temp 97.8 F (36.6 C) (Oral)   Resp 16   Ht 5\' 7"  (1.702 m)   Wt 171 lb (77.6 kg)   BMI 26.78 kg/m  GEN- NAD, alert and oriented x3 HEENT- PERRL, EOMI, non injected sclera, pink conjunctiva, MMM, oropharynx clear CVS- RRR, no murmur RESP-CTAB EXT- pedal edema bilat  Pulses- Radial, DP- 2+       Assessment & Plan:      Problem List Items Addressed This Visit    Pedal edema    Again D/C HCTZ use lasix instead      Hypertension - Primary    Well controlled, no changes, fasting labs today, on lipitor as prescribed for her lipids, history of CVA      Relevant Orders   CBC with Differential/Platelet   Comprehensive metabolic panel   Hyperlipidemia   Relevant Orders   Lipid panel    Other Visit Diagnoses   None.     Note: This dictation was prepared with Dragon dictation along with smaller phrase technology. Any transcriptional errors that result from this process are unintentional.

## 2016-06-20 DIAGNOSIS — H40013 Open angle with borderline findings, low risk, bilateral: Secondary | ICD-10-CM | POA: Diagnosis not present

## 2016-07-03 ENCOUNTER — Other Ambulatory Visit: Payer: Self-pay | Admitting: Family Medicine

## 2016-09-10 ENCOUNTER — Other Ambulatory Visit: Payer: Self-pay | Admitting: Family Medicine

## 2016-10-03 DIAGNOSIS — H40013 Open angle with borderline findings, low risk, bilateral: Secondary | ICD-10-CM | POA: Diagnosis not present

## 2016-11-18 ENCOUNTER — Encounter: Payer: Self-pay | Admitting: Family Medicine

## 2016-11-18 ENCOUNTER — Ambulatory Visit (INDEPENDENT_AMBULATORY_CARE_PROVIDER_SITE_OTHER): Payer: Medicare PPO | Admitting: Family Medicine

## 2016-11-18 VITALS — BP 126/72 | HR 80 | Temp 98.1°F | Resp 14 | Ht 67.0 in | Wt 178.0 lb

## 2016-11-18 DIAGNOSIS — E7800 Pure hypercholesterolemia, unspecified: Secondary | ICD-10-CM

## 2016-11-18 DIAGNOSIS — F5101 Primary insomnia: Secondary | ICD-10-CM | POA: Diagnosis not present

## 2016-11-18 DIAGNOSIS — Z8673 Personal history of transient ischemic attack (TIA), and cerebral infarction without residual deficits: Secondary | ICD-10-CM | POA: Diagnosis not present

## 2016-11-18 DIAGNOSIS — Z9889 Other specified postprocedural states: Secondary | ICD-10-CM

## 2016-11-18 DIAGNOSIS — I10 Essential (primary) hypertension: Secondary | ICD-10-CM | POA: Diagnosis not present

## 2016-11-18 LAB — COMPREHENSIVE METABOLIC PANEL
ALK PHOS: 119 U/L (ref 33–130)
ALT: 31 U/L — AB (ref 6–29)
AST: 25 U/L (ref 10–35)
Albumin: 4.1 g/dL (ref 3.6–5.1)
BILIRUBIN TOTAL: 0.4 mg/dL (ref 0.2–1.2)
BUN: 19 mg/dL (ref 7–25)
CO2: 29 mmol/L (ref 20–31)
CREATININE: 0.78 mg/dL (ref 0.60–0.93)
Calcium: 9.3 mg/dL (ref 8.6–10.4)
Chloride: 102 mmol/L (ref 98–110)
GLUCOSE: 82 mg/dL (ref 70–99)
Potassium: 4.2 mmol/L (ref 3.5–5.3)
SODIUM: 141 mmol/L (ref 135–146)
TOTAL PROTEIN: 7.4 g/dL (ref 6.1–8.1)

## 2016-11-18 LAB — LIPID PANEL
Cholesterol: 241 mg/dL — ABNORMAL HIGH (ref <200)
HDL: 105 mg/dL (ref >50)
LDL CALC: 112 mg/dL — AB (ref <100)
TRIGLYCERIDES: 121 mg/dL (ref <150)
Total CHOL/HDL Ratio: 2.3 Ratio (ref <5.0)
VLDL: 24 mg/dL (ref <30)

## 2016-11-18 NOTE — Assessment & Plan Note (Signed)
She does not want to try any other type of medication. Advised that she can try melatonin again and see if this helps. If that she with her age with her being fatigued in the morning if she does drive she may fall asleep if I see her not to drive long distances and have someone else take her.

## 2016-11-18 NOTE — Patient Instructions (Addendum)
F/U 6 months for Physical  Try the melatonin for sleep  Take cholesterol at bedtime - others take in the morning

## 2016-11-18 NOTE — Assessment & Plan Note (Signed)
Much improved with the statin drug

## 2016-11-18 NOTE — Assessment & Plan Note (Signed)
Continue seizure prophylaxis

## 2016-11-18 NOTE — Assessment & Plan Note (Signed)
Doing well on medications 

## 2016-11-18 NOTE — Progress Notes (Signed)
   Subjective:    Patient ID: Molly Patel, female    DOB: 08-Aug-1946, 71 y.o.   MRN: [id number]  Patient presents for Follow-up (is fasting)  Patient here for follow-up chronic medical problems. She has history of meningioma resection on Dilantin for seizure prevention.  History of cerebral infarction is on aspirin also on statin drug for hyperlipidemia, her cholesterol was much improved at last check 4 months ago LDL was 91 , TC 210   Hypertension- taking blood pressure medicine as prescribed and does not have any difficulty  Chronic insomnia- she gets tired when driving long distances She still does not sleep well at night. We have tried temazepam Ambien at the sleep aides they have not helped. She notes that she still does not sleep good at night and is always tired during the day.  Review Of Systems:  GEN- denies fatigue, fever, weight loss,weakness, recent illness HEENT- denies eye drainage, change in vision, nasal discharge, CVS- denies chest pain, palpitations RESP- denies SOB, cough, wheeze ABD- denies N/V, change in stools, abd pain GU- denies dysuria, hematuria, dribbling, incontinence MSK- denies joint pain, muscle aches, injury Neuro- denies headache, dizziness, syncope, seizure activity       Objective:    BP 126/72   Pulse 80   Temp 98.1 F (36.7 C) (Oral)   Resp 14   Ht 5\' 7"  (1.702 m)   Wt 178 lb (80.7 kg)   SpO2 98%   BMI 27.88 kg/m  GEN- NAD, alert and oriented x3 HEENT- PERRL, EOMI, non injected sclera, pink conjunctiva, MMM, oropharynx clear Neck- Supple, no bruit CVS- RRR, no murmur RESP-CTAB EXT- chronic ankle edema Pulses- Radial, DP- 2+        Assessment & Plan:      Problem List Items Addressed This Visit    S/P craniotomy - Primary    Continue seizure prophylaxis      Insomnia    She does not want to try any other type of medication. Advised that she can try melatonin again and see if this helps. If that she with her age with  her being fatigued in the morning if she does drive she may fall asleep if I see her not to drive long distances and have someone else take her.      Hypertension    Doing well on medications       Hyperlipidemia    Much improved with the statin drug       Relevant Orders   Lipid panel   Comprehensive metabolic panel   History of CVA (cerebrovascular accident)      Note: This dictation was prepared with Dragon dictation along with smaller phrase technology. Any transcriptional errors that result from this process are unintentional.

## 2016-11-24 ENCOUNTER — Other Ambulatory Visit: Payer: Self-pay | Admitting: Family Medicine

## 2016-12-06 ENCOUNTER — Other Ambulatory Visit: Payer: Self-pay | Admitting: Family Medicine

## 2016-12-08 ENCOUNTER — Other Ambulatory Visit: Payer: Self-pay | Admitting: *Deleted

## 2016-12-08 MED ORDER — PHENYTOIN SODIUM EXTENDED 100 MG PO CAPS: ORAL_CAPSULE | ORAL | 1 refills | Status: DC

## 2017-01-22 ENCOUNTER — Encounter: Payer: MEDICARE | Attending: Urology | Primary: Community Health

## 2017-01-26 ENCOUNTER — Other Ambulatory Visit: Payer: Self-pay | Admitting: Family Medicine

## 2017-01-27 ENCOUNTER — Other Ambulatory Visit: Payer: Self-pay | Admitting: *Deleted

## 2017-01-27 MED ORDER — VITAMIN D (CHOLECALCIFEROL) 25 MCG (1000 UT) PO CAPS: 1.0000 | ORAL_CAPSULE | Freq: Every day | ORAL | 3 refills | Status: DC

## 2017-04-16 DIAGNOSIS — H02831 Dermatochalasis of right upper eyelid: Secondary | ICD-10-CM | POA: Diagnosis not present

## 2017-04-16 DIAGNOSIS — H02834 Dermatochalasis of left upper eyelid: Secondary | ICD-10-CM | POA: Diagnosis not present

## 2017-04-16 DIAGNOSIS — H02423 Myogenic ptosis of bilateral eyelids: Secondary | ICD-10-CM | POA: Diagnosis not present

## 2017-04-16 DIAGNOSIS — G245 Blepharospasm: Secondary | ICD-10-CM | POA: Diagnosis not present

## 2017-04-20 ENCOUNTER — Other Ambulatory Visit: Payer: Self-pay | Admitting: Family Medicine

## 2017-05-22 ENCOUNTER — Ambulatory Visit (INDEPENDENT_AMBULATORY_CARE_PROVIDER_SITE_OTHER): Payer: Medicare PPO | Admitting: Family Medicine

## 2017-05-22 ENCOUNTER — Encounter: Payer: Self-pay | Admitting: Family Medicine

## 2017-05-22 VITALS — BP 120/78 | HR 56 | Temp 97.7°F | Resp 14 | Ht 67.0 in | Wt 170.8 lb

## 2017-05-22 DIAGNOSIS — Z0000 Encounter for general adult medical examination without abnormal findings: Secondary | ICD-10-CM | POA: Diagnosis not present

## 2017-05-22 DIAGNOSIS — Z23 Encounter for immunization: Secondary | ICD-10-CM

## 2017-05-22 DIAGNOSIS — Z1231 Encounter for screening mammogram for malignant neoplasm of breast: Secondary | ICD-10-CM | POA: Diagnosis not present

## 2017-05-22 DIAGNOSIS — I10 Essential (primary) hypertension: Secondary | ICD-10-CM | POA: Diagnosis not present

## 2017-05-22 DIAGNOSIS — Z1159 Encounter for screening for other viral diseases: Secondary | ICD-10-CM | POA: Diagnosis not present

## 2017-05-22 DIAGNOSIS — R5383 Other fatigue: Secondary | ICD-10-CM

## 2017-05-22 DIAGNOSIS — Z1239 Encounter for other screening for malignant neoplasm of breast: Secondary | ICD-10-CM

## 2017-05-22 DIAGNOSIS — R600 Localized edema: Secondary | ICD-10-CM

## 2017-05-22 DIAGNOSIS — Z8673 Personal history of transient ischemic attack (TIA), and cerebral infarction without residual deficits: Secondary | ICD-10-CM | POA: Diagnosis not present

## 2017-05-22 NOTE — Progress Notes (Signed)
Subjective:   Patient presents for Medicare Annual/Subsequent preventive examination.   Medications reviewed  Seen by [facility] for the ptosis and dropping eyelies contributing to her blinking- given klonopin    Hyperlipidemia- TC elevated 214 and LDL 112 6 months , on lipitor She has felt fatigued. More thanusual, feels good, cant pinpoint anything specific just fatigued   Feels ankles keep swelling up, she is taking the lasix No shortness of breath no chest pain  Still gets intermittant "swelling" around her craniotomoy scar States sometimes it looks puffy around the scar she does not have any pain she is actually complaining of these episodes and she has had the scar    Review Past Medical/Family/Social: per EMR   Risk Factors  Current exercise habits:walks  Dietary issues discussed: yes  Cardiac risk factors: stroke, HTN, Hyperlipidemia  Depression Screen  (Note: if answer to either of the following is "Yes", a more complete depression screening is indicated)  Over the past two weeks, have you felt down, depressed or hopeless? No Over the past two weeks, have you felt little interest or pleasure in doing things? No Have you lost interest or pleasure in daily life? No Do you often feel hopeless? No Do you cry easily over simple problems? No   Activities of Daily Living  In your present state of health, do you have any difficulty performing the following activities?:  Driving? No  Managing money? No  Feeding yourself? No  Getting from bed to chair? No  Climbing a flight of stairs? No  Preparing food and eating?: No  Bathing or showering? No  Getting dressed: No  Getting to the toilet? No  Using the toilet:No  Moving around from place to place: No  In the past year have you fallen or had a near fall?:No  Are you sexually active? yes Do you have more than one partner? No   Hearing Difficulties:   Do you often ask people to speak up or repeat themselves? No  Do you  experience ringing or noises in your ears? No Do you have difficulty understanding soft or whispered voices? No Do you feel that you have a problem with memory?Yes Do you often misplace items? No  Do you feel safe at home? Yes  Cognitive Testing  Alert? Yes Normal Appearance?Yes  Oriented to person? Yes Place? Yes  Time? Yes  Recall of three objects? Yes  Can perform simple calculations? Yes  Displays appropriate judgment?Yes  Can read the correct time from a watch face?Yes    List the Names of Other Physician/Practitioners you currently use:   Opthalmology, Neurosugery   Screening Tests / Date Colonoscopy     UTD                 Zostavax Declined  Mammogram UTD 2017  Influenza Vaccine Due Tetanus/tdap UTD Pneumonia- UTD  ROS: GEN- denies fatigue, fever, weight loss,weakness, recent illness HEENT- denies eye drainage, change in vision, nasal discharge, CVS- denies chest pain, palpitations RESP- denies SOB, cough, wheeze ABD- denies N/V, change in stools, abd pain GU- denies dysuria, hematuria, dribbling, incontinence MSK- denies joint pain, muscle aches, injury Neuro- denies headache, dizziness, syncope, seizure activity  PHYSICAL: GEN- NAD, alert and oriented x3 HEENT- PERRL, EOMI, non injected sclera, pink conjunctiva, MMM, oropharynx clear, MT clear bilat, old craniomtomy scar in tact no significant swelling around  Neck- Supple, no thryomegaly, no JVD CVS- RRR, no murmur RESP-CTAB ABD-NABS,soft,NT,ND EXT- pedal edema Pulses- Radial, DP- 2+  Assessment:    Annual wellness medicare exam   Plan:    During the course of the visit the patient was educated and counseled about appropriate screening and preventive services including:  Screening mammography she is going to schedule for this year  Flu shot given  Her fatigue is likely multifactorial. Her blood pressure with good weight is okay appetite is good. We'll check some labs including a B-12  level   Screen negative for depression   Reviewed her ophthalmology note as well she is trying the clonazepam which she thinks helps for her eyelid drooping  We discussed advanced directives  Appetite deceased screening done today  We'll pressure looks good no change in medication given reassurance about the swelling around her craniotomy scar which I do not see anything significant today.  Her peripheral edema which is chronic it is only at the ankles have given her compression hose she is already On Lasix   Diet review for nutrition referral? Yes ____ Not Indicated __x__  Patient Instructions (the written plan) was given to the patient.  Medicare Attestation  I have personally reviewed:  The patient's medical and social history  Their use of alcohol, tobacco or illicit drugs  Their current medications and supplements  The patient's functional ability including ADLs,fall risks, home safety risks, cognitive, and hearing and visual impairment  Diet and physical activities  Evidence for depression or mood disorders  The patient's weight, height, BMI, and visual acuity have been recorded in the chart. I have made referrals, counseling, and provided education to the patient based on review of the above and I have provided the patient with a written personalized care plan for preventive services.

## 2017-05-22 NOTE — Patient Instructions (Signed)
F/U 4 months  Schedule your mammogram Get the compression hose We will call with lab results

## 2017-05-23 LAB — CBC WITH DIFFERENTIAL/PLATELET
BASOS ABS: 70 {cells}/uL (ref 0–200)
BASOS PCT: 1.2 %
EOS PCT: 1.9 %
Eosinophils Absolute: 110 cells/uL (ref 15–500)
HEMATOCRIT: 38.7 % (ref 35.0–45.0)
HEMOGLOBIN: 13.2 g/dL (ref 11.7–15.5)
LYMPHS ABS: 1833 {cells}/uL (ref 850–3900)
MCH: 30.6 pg (ref 27.0–33.0)
MCHC: 34.1 g/dL (ref 32.0–36.0)
MCV: 89.6 fL (ref 80.0–100.0)
MPV: 10.7 fL (ref 7.5–12.5)
Monocytes Relative: 13.9 %
NEUTROS ABS: 2981 {cells}/uL (ref 1500–7800)
Neutrophils Relative %: 51.4 %
Platelets: 185 10*3/uL (ref 140–400)
RBC: 4.32 10*6/uL (ref 3.80–5.10)
RDW: 13.3 % (ref 11.0–15.0)
Total Lymphocyte: 31.6 %
WBC mixed population: 806 cells/uL (ref 200–950)
WBC: 5.8 10*3/uL (ref 3.8–10.8)

## 2017-05-23 LAB — COMPREHENSIVE METABOLIC PANEL
AG RATIO: 1.4 (calc) (ref 1.0–2.5)
ALT: 27 U/L (ref 6–29)
AST: 17 U/L (ref 10–35)
Albumin: 4 g/dL (ref 3.6–5.1)
Alkaline phosphatase (APISO): 105 U/L (ref 33–130)
BUN: 18 mg/dL (ref 7–25)
CHLORIDE: 101 mmol/L (ref 98–110)
CO2: 24 mmol/L (ref 20–32)
Calcium: 9.2 mg/dL (ref 8.6–10.4)
Creat: 0.66 mg/dL (ref 0.60–0.93)
GLOBULIN: 2.8 g/dL (ref 1.9–3.7)
GLUCOSE: 83 mg/dL (ref 65–99)
Potassium: 4.2 mmol/L (ref 3.5–5.3)
SODIUM: 138 mmol/L (ref 135–146)
TOTAL PROTEIN: 6.8 g/dL (ref 6.1–8.1)
Total Bilirubin: 0.3 mg/dL (ref 0.2–1.2)

## 2017-05-23 LAB — VITAMIN B12: Vitamin B-12: 413 pg/mL (ref 200–1100)

## 2017-05-23 LAB — TSH: TSH: 2.5 mIU/L (ref 0.40–4.50)

## 2017-05-23 LAB — LIPID PANEL
CHOL/HDL RATIO: 2.4 (calc) (ref <5.0)
Cholesterol: 208 mg/dL — ABNORMAL HIGH (ref <200)
HDL: 88 mg/dL (ref >50)
LDL Cholesterol (Calc): 94 mg/dL (calc)
NON-HDL CHOLESTEROL (CALC): 120 mg/dL (ref <130)
Triglycerides: 166 mg/dL — ABNORMAL HIGH (ref <150)

## 2017-05-23 LAB — HEPATITIS C ANTIBODY
Hepatitis C Ab: NONREACTIVE
SIGNAL TO CUT-OFF: 0.03 (ref <1.00)

## 2017-06-17 ENCOUNTER — Other Ambulatory Visit: Payer: Self-pay | Admitting: Family Medicine

## 2017-06-18 ENCOUNTER — Encounter: Payer: Self-pay | Admitting: Family Medicine

## 2017-06-18 ENCOUNTER — Other Ambulatory Visit: Payer: Self-pay | Admitting: Family Medicine

## 2017-06-18 DIAGNOSIS — Z1231 Encounter for screening mammogram for malignant neoplasm of breast: Secondary | ICD-10-CM

## 2017-07-09 ENCOUNTER — Ambulatory Visit (HOSPITAL_COMMUNITY)
Admission: RE | Admit: 2017-07-09 | Discharge: 2017-07-09 | Disposition: A | Discharge status: Home / Self Care | Payer: Medicare PPO | Source: Ambulatory Visit | Attending: Family Medicine | Admitting: Family Medicine

## 2017-07-09 ENCOUNTER — Encounter (HOSPITAL_COMMUNITY): Payer: Self-pay

## 2017-07-09 DIAGNOSIS — Z1231 Encounter for screening mammogram for malignant neoplasm of breast: Secondary | ICD-10-CM | POA: Diagnosis not present

## 2017-09-13 ENCOUNTER — Other Ambulatory Visit: Payer: Self-pay | Admitting: Family Medicine

## 2017-09-21 ENCOUNTER — Other Ambulatory Visit: Payer: Self-pay

## 2017-09-21 ENCOUNTER — Ambulatory Visit: Payer: Medicare PPO | Admitting: Family Medicine

## 2017-09-21 ENCOUNTER — Encounter: Payer: Self-pay | Admitting: Family Medicine

## 2017-09-21 VITALS — BP 122/70 | HR 54 | Temp 97.6°F | Resp 16 | Ht 67.0 in | Wt 172.0 lb

## 2017-09-21 DIAGNOSIS — E7800 Pure hypercholesterolemia, unspecified: Secondary | ICD-10-CM

## 2017-09-21 DIAGNOSIS — Z9889 Other specified postprocedural states: Secondary | ICD-10-CM | POA: Diagnosis not present

## 2017-09-21 DIAGNOSIS — Z8673 Personal history of transient ischemic attack (TIA), and cerebral infarction without residual deficits: Secondary | ICD-10-CM

## 2017-09-21 DIAGNOSIS — R600 Localized edema: Secondary | ICD-10-CM

## 2017-09-21 DIAGNOSIS — I10 Essential (primary) hypertension: Secondary | ICD-10-CM | POA: Diagnosis not present

## 2017-09-21 DIAGNOSIS — R4189 Other symptoms and signs involving cognitive functions and awareness: Secondary | ICD-10-CM | POA: Diagnosis not present

## 2017-09-21 MED ORDER — DONEPEZIL HCL 5 MG PO TABS: 5.0000 mg | ORAL_TABLET | Freq: Every day | ORAL | 1 refills | Status: DC

## 2017-09-21 NOTE — Assessment & Plan Note (Signed)
Continue lasix and compression hose She is holding the HCTZ at this time

## 2017-09-21 NOTE — Assessment & Plan Note (Signed)
History of stroke and craniotomy for meningioma Trial of aricept 5mg 

## 2017-09-21 NOTE — Patient Instructions (Addendum)
F/U 2 months for memory  New medicine aricept for memory

## 2017-09-21 NOTE — Assessment & Plan Note (Signed)
Well controlled no changes Lipids improved on statin  on ASA

## 2017-09-21 NOTE — Progress Notes (Signed)
   Subjective:    Patient ID: Molly Patel, female    DOB: Jan 17, 1946, 72 y.o.   MRN: [id number]  Patient presents for Follow-up (is not fasting)  Patient here to follow-up chronic medical problems.  Medications reviewed. Hypertension she is taking her blood pressure medicine as prescribed.  Hyperlipidemia improved for cholesterol LDL and total cholesterol at her last check 4 months ago.  Working out at [employer]   S/P cranitotomy on Dilantin for seizure prophylaxis    Peripheral edema- wears compression hose and uses the lasix  As needed, will not take the HCTZ when she does use this   End of visit she states memory getting bad, she forgets directions often has to use her GPS. She pays her bills regular without difficulty. Mixes up appointments. Would like to try something to help her memory      Review Of Systems:  GEN- denies fatigue, fever, weight loss,weakness, recent illness HEENT- denies eye drainage, change in vision, nasal discharge, CVS- denies chest pain, palpitations RESP- denies SOB, cough, wheeze ABD- denies N/V, change in stools, abd pain GU- denies dysuria, hematuria, dribbling, incontinence MSK- denies joint pain, muscle aches, injury Neuro- denies headache, dizziness, syncope, seizure activity       Objective:    BP 122/70   Pulse (!) 54   Temp 97.6 F (36.4 C) (Oral)   Resp 16   Ht 5\' 7"  (1.702 m)   Wt 172 lb (78 kg)   SpO2 98%   BMI 26.94 kg/m  GEN- NAD, alert and oriented x3 HEENT- PERRL, EOMI, non injected sclera, pink conjunctiva, MMM, oropharynx clear Neck- Supple, no thyromegaly CVS- RRR, no murmur RESP-CTAB ABD-NABS,soft,NT,ND Psych- normal affect and mood Neuro-CNII-XII in tact, no focal deficits  EXT-ankle edema bilat  Pulses- Radial,2+        Assessment & Plan:      Problem List Items Addressed This Visit      Unprioritized   Hyperlipidemia   History of CVA (cerebrovascular accident)   S/P craniotomy    On seizure  prophylaxsis      Pedal edema    Continue lasix and compression hose She is holding the HCTZ at this time      Hypertension - Primary    Well controlled no changes Lipids improved on statin  on ASA      Cognitive changes    History of stroke and craniotomy for meningioma Trial of aricept 5mg          Note: This dictation was prepared with Dragon dictation along with smaller phrase technology. Any transcriptional errors that result from this process are unintentional.

## 2017-09-21 NOTE — Assessment & Plan Note (Signed)
On seizure prophylaxsis

## 2017-09-25 DIAGNOSIS — G245 Blepharospasm: Secondary | ICD-10-CM | POA: Diagnosis not present

## 2017-10-14 ENCOUNTER — Encounter: Payer: MEDICARE | Attending: Community Health | Primary: Community Health

## 2017-10-19 NOTE — Telephone Encounter (Signed)
Agree with IR.  As long as urine is draining the tube is still in place. Has she moved here or just visiting.  If she lives here now she will need appt after IR fixes here tube issue.

## 2017-10-19 NOTE — Telephone Encounter (Signed)
Pts daughter calling and states that her nephrostomy tube is coming out. The stiches have come loose. Ivin Booty advised that pt will need to go to Interventional radiology. She said that Melissa would be the one to let know.

## 2017-10-19 NOTE — Telephone Encounter (Signed)
Pt is an old pt of yours (last seen in June 2016).  She has been living in [place], [place].  She had a nephrostomy tube placed while in [place] and daughter Earnest Bailey is calling stating the nephrostomy tube is falling out and stitches are loose.  Where do I direct her for treatment for this?  It was done in [place] and you have not seen her in 3 years.  She has an appt with you 3/18.  Please advise.  Thanks.

## 2017-10-20 ENCOUNTER — Ambulatory Visit: Admit: 2017-10-20 | Discharge: 2017-10-20 | Payer: MEDICARE | Attending: Community Health | Primary: Community Health

## 2017-10-20 DIAGNOSIS — Z7689 Persons encountering health services in other specified circumstances: Secondary | ICD-10-CM

## 2017-10-20 NOTE — Telephone Encounter (Signed)
Pt calling back today and would like to know what to do . She says she must have something done very soon because it is hurting her.  Her urine is draining. She says the tube in her back is hurting her.  She says it is burning and aching.   No fever.  She says she can not wait til march appt. Please call her and advise. [phone] is her number

## 2017-10-20 NOTE — Progress Notes (Signed)
H & P- New Patient      Reason for Visit:    Chief Complaint   Patient presents with   ??? Establish Care     pt is wanting to establish care for primary care issues. pt is fasting.       History of Present Illness:  Ms. Tootle is a 72 y.o. female who presents today to establish care.  The past medical history is significant for htn, gerd, acquired hypothyroidism, frontotemporal dementia, asthma, bladder cancer, hld, depression and anxiety and IDA.    Patient has recently moved here from [place] and is here today with daughter Earnest Bailey. Pt is an old pt of Dr. Mikeal Hawthorne [facility] last seen in June 2016.  She has been living in [place], [place].  She had a nephrostomy tube placed while in [place] and daughter Earnest Bailey is calling stating the nephrostomy tube is falling out and stitches are loose. She has an appt with Dr. Myrna Blazer to reestablish care on 3/18. I will place referral to IR DT for possible displaced tube. She did have an ER visit recently and XR of the abdomen was completed and showed:    "REASON FOR EXAM: ^invasive line loose; concern for displacement?  ADMISSION DATE: 10/18/2017 18:30  Comparison none    Single view abdomen.????There is a catheter seen in the right abdomen with its tip overlying the right pelvis.????Based on this single view the catheter appears be continuous.????This of uncertain etiology.????There is a mild to moderate amount of stool seen in the transverse colon and descending colon.????Scattered small bowel gas is seen.????Some mild constipation cannot be excluded.????Clips is seen the pelvis as well as a suture line in the right pelvis.  Impression: Mild to moderate amount of stool seen the colon.????The bowel gas pattern is nonspecific"    Her vital signs are stable. She is in no acute distress. She is fasting today. Will check labs and follow up in 4 weeks. She did not bring her medications with her. Daughter stated she will bring them during next  visit. She has been instructed to monitor for signs and symptoms of infection until tube is secured by IR.     She was seen on 2/15 by Np Atkins for cognitive concerns and diagnosed with frontotemporal dementia. She is recently widowed and has traveled back and forth from [place], to [place] to [place] after her husbands death.    Establish Care   The history is provided by the patient. This is a chronic problem. Pertinent negatives include no chest pain, no abdominal pain, no headaches and no shortness of breath.   Dementia    The history is provided by the patient. This is a new problem. The current episode started more than 1 week ago. The problem has not changed since onset.Associated symptoms include confusion and agitation. Pertinent negatives include no weakness. Risk factors include dementia. Her past medical history is significant for hypertension, depression, dementia and psychotropic medication treatment. Her past medical history does not include seizures, CVA or TIA.       Review of Systems:  Review of Systems   Constitutional: Negative for chills, fever and malaise/fatigue.   HENT: Negative for congestion, ear pain, sinus pain and sore throat.    Eyes: Negative for pain.   Respiratory: Negative for cough, sputum production, shortness of breath and wheezing.    Cardiovascular: Negative for chest pain and palpitations.   Gastrointestinal: Negative for abdominal pain, blood in stool, constipation, diarrhea, nausea and vomiting.   Genitourinary:  Negative for dysuria, frequency and urgency.   Musculoskeletal: Negative for myalgias.   Neurological: Negative for dizziness, weakness and headaches.        Dementia   Psychiatric/Behavioral: Positive for agitation, confusion and depression.             No Known Allergies  Past Medical History:   Diagnosis Date   ??? Abdominal pain, unspecified site 10/27/2012   ??? Arthritis     generalized   ??? Asthma 10/27/2012   ??? Bladder cancer ([place])    ??? Depression      managed with medication    ??? Dysuria 10/27/2012   ??? Environmental allergies     managed with medication    ??? GERD (gastroesophageal reflux disease) 10/27/2012   ??? History of kidney stones     states surgical removed   ??? Hypothyroidism     managed with medication    ??? Other "heavy-for-dates" infants     ureterectomy and ileal conduit   ??? Unspecified disorder of bladder 10/27/2012   ??? Urethral caruncle 10/27/2012   ??? Urinary frequency 10/27/2012   ??? Urinary tract infection, site not specified 10/27/2012     Past Surgical History:   Procedure Laterality Date   ??? CLOSE CYSTOSTOMY      cystoscopy/ turbt x 2   ??? HX BREAST BIOPSY Left 2000    x 2   ??? HX CESAREAN SECTION      x 1   ??? HX CHOLECYSTECTOMY     ??? HX NEPHROSTOMY  2014   ??? HX TUBAL LIGATION     ??? HX WRIST FRACTURE TX Left      Family History   Problem Relation Age of Onset   ??? Stroke Mother    ??? Heart Disease Mother    ??? Cancer Father    ??? Asthma Father    ??? Lung Disease Father         COPD/smoker   ??? Cancer Brother    ??? Heart Disease Brother    ??? Alcohol abuse Brother      Social History     Socioeconomic History   ??? Marital status: MARRIED     Spouse name: Not on file   ??? Number of children: Not on file   ??? Years of education: Not on file   ??? Highest education level: Not on file   Social Needs   ??? Financial resource strain: Not on file   ??? Food insecurity - worry: Not on file   ??? Food insecurity - inability: Not on file   ??? Transportation needs - medical: Not on file   ??? Transportation needs - non-medical: Not on file   Occupational History   ??? Not on file   Tobacco Use   ??? Smoking status: Former Smoker     Packs/day: 0.50     Years: 20.00     Pack years: 10.00   ??? Smokeless tobacco: Never Used   ??? Tobacco comment: stopped at age 18   Substance and Sexual Activity   ??? Alcohol use: No   ??? Drug use: No   ??? Sexual activity: No   Other Topics Concern   ??? Not on file   Social History Narrative   ??? Not on file     Current Outpatient Medications   Medication Sig Dispense Refill    ??? calcium carb-vitamin D3-vit K2 600 mg-1,000 unit-90 mcg tab Take  by mouth.     ??? cyanocobalamin  50 mcg tablet Take 50 mcg by mouth.     ??? divalproex (DEPAKOTE SPRINKLE) 125 mg capsule Take 250 mg by mouth.     ??? famotidine (PEPCID) 20 mg tablet Take 20 mg by mouth.     ??? memantine ER (NAMENDA XR) 28 mg capsule Take 28 mg by mouth. 2 tablets in the morning and 2 tablets in the evening.     ??? PARoxetine (PAXIL) 20 mg tablet Take 20 mg by mouth.     ??? senna-docusate (PERICOLACE) 8.6-50 mg per tablet Take  by mouth.     ??? calcium carbonate (OS-CAL) 500 mg calcium (1,250 mg) tablet Take  by mouth daily.     ??? ascorbic acid, vitamin C, (VITAMIN C) 1,000 mg tablet Take  by mouth.     ??? ferrous sulfate (IRON) 325 mg (65 mg iron) tablet Take  by mouth Daily (before breakfast).     ??? citalopram (CELEXA) 20 mg tablet Take 1 Tab by mouth daily. 90 Tab 3       3 most recent PHQ Screens 08/29/2015   Little interest or pleasure in doing things Several days   Feeling down, depressed, irritable, or hopeless Several days   Total Score PHQ 2 2     Fall Risk Assessment, last 12 mths 10/20/2017   Able to walk? Yes   Fall in past 12 months? No       OBJECTIVE:  Visit Vitals  BP 120/60 (BP 1 Location: Left arm, BP Patient Position: Sitting)   Pulse 86   Temp 98 ??F (36.7 ??C) (Oral)   Resp 18   Ht 5\' 2"  (1.575 m)   Wt 127 lb 9.6 oz (57.9 kg)   SpO2 98%   BMI 23.34 kg/m??       Physical Exam:  Physical Exam   Constitutional: She is oriented to person, place, and time. She appears well-developed.   Eyes: Pupils are equal, round, and reactive to light.   Cardiovascular: Normal rate.   Pulmonary/Chest: Effort normal and breath sounds normal.   Abdominal: Soft. Bowel sounds are normal.   Neurological: She is alert and oriented to person, place, and time.   Skin: Skin is warm and dry.   Vitals reviewed.      Labs:  Fasting labs today   ASSESSMENT:  Diagnoses and all orders for this visit:    1. Encounter to establish care     2. Displacement of nephrostomy tube (HCC)  -     REFERRAL TO INTERVENTIONAL RADIOLOGY  -     IR EXCHANGE NEPHRO PERC RT SI; Future    3. Essential hypertension  -     METABOLIC PANEL, COMPREHENSIVE    4. Mixed hyperlipidemia  -     LIPID PANEL    5. Acquired hypothyroidism  -     TSH 3RD GENERATION    6. Iron deficiency anemia, unspecified iron deficiency anemia type  -     CBC WITH AUTOMATED DIFF      PLAN:  Earnest Bailey will bring medications during next visit to ensure that medications are updated and correct. I have spoken with IR. Will fax orders to [facility] at [phone]. I have called daughter Earnest Bailey and informed her that we need most recent records from when nephrostomy tube was placed to send to IR as soon as possible. Awaiting call back.Pertinent old records were reviewed on care everywhere. All questions were asked and answered to the best of my ability.  The patient  verbalized understanding and agrees with the plan above.             Rae Roam [facility]  [place]  [address] [place] [address]  Office : [phone]  Fax : [phone]

## 2017-10-20 NOTE — Telephone Encounter (Signed)
I called pt and advised Thank you

## 2017-10-20 NOTE — Telephone Encounter (Signed)
See office note from today from NP.  Her new pcp is taking care of referring her to IR.  She needs to call them about this issue but it looks like daughter Earnest Bailey knows all about this.  They are waiting for records from [place] I believe.

## 2017-10-20 NOTE — ACP (Advance Care Planning) (Signed)
Advance Care Planning (ACP)       Attempted to discuss ACP with Ms. Basich today, she declines to discuss at this time.  Will readdress at future visit. Paperwork given to pt today.

## 2017-10-21 LAB — METABOLIC PANEL, COMPREHENSIVE
A-G Ratio: 1.5 (ref 1.2–2.2)
ALT (SGPT): 9 IU/L (ref 0–32)
AST (SGOT): 18 IU/L (ref 0–40)
Albumin: 4.1 g/dL (ref 3.5–4.8)
Alk. phosphatase: 81 IU/L (ref 39–117)
BUN/Creatinine ratio: 14 (ref 12–28)
BUN: 14 mg/dL (ref 8–27)
Bilirubin, total: 0.3 mg/dL (ref 0.0–1.2)
CO2: 27 mmol/L (ref 20–29)
Calcium: 9.4 mg/dL (ref 8.7–10.3)
Chloride: 101 mmol/L (ref 96–106)
Creatinine: 1.02 mg/dL — ABNORMAL HIGH (ref 0.57–1.00)
GFR est AA: 63 mL/min/{1.73_m2} (ref >59)
GFR est non-AA: 55 mL/min/{1.73_m2} — ABNORMAL LOW (ref >59)
GLOBULIN, TOTAL: 2.7 g/dL (ref 1.5–4.5)
Glucose: 99 mg/dL (ref 65–99)
Potassium: 5.1 mmol/L (ref 3.5–5.2)
Protein, total: 6.8 g/dL (ref 6.0–8.5)
Sodium: 143 mmol/L (ref 134–144)

## 2017-10-21 LAB — CBC WITH AUTOMATED DIFF
ABS. BASOPHILS: 0 10*3/uL (ref 0.0–0.2)
ABS. EOSINOPHILS: 0.3 10*3/uL (ref 0.0–0.4)
ABS. IMM. GRANS.: 0 10*3/uL (ref 0.0–0.1)
ABS. MONOCYTES: 0.7 10*3/uL (ref 0.1–0.9)
ABS. NEUTROPHILS: 5.6 10*3/uL (ref 1.4–7.0)
Abs Lymphocytes: 1.8 10*3/uL (ref 0.7–3.1)
BASOPHILS: 1 %
EOSINOPHILS: 3 %
HCT: 43.2 % (ref 34.0–46.6)
HGB: 14.5 g/dL (ref 11.1–15.9)
IMMATURE GRANULOCYTES: 0 %
Lymphocytes: 21 %
MCH: 30.3 pg (ref 26.6–33.0)
MCHC: 33.6 g/dL (ref 31.5–35.7)
MCV: 90 fL (ref 79–97)
MONOCYTES: 9 %
NEUTROPHILS: 66 %
PLATELET: 228 10*3/uL (ref 150–379)
RBC: 4.78 x10E6/uL (ref 3.77–5.28)
RDW: 14.4 % (ref 12.3–15.4)
WBC: 8.5 10*3/uL (ref 3.4–10.8)

## 2017-10-21 LAB — LIPID PANEL
Cholesterol, total: 177 mg/dL (ref 100–199)
HDL Cholesterol: 58 mg/dL (ref >39)
LDL, calculated: 100 mg/dL — ABNORMAL HIGH (ref 0–99)
Triglyceride: 95 mg/dL (ref 0–149)
VLDL, calculated: 19 mg/dL (ref 5–40)

## 2017-10-21 LAB — TSH 3RD GENERATION: TSH: 1.16 u[IU]/mL (ref 0.450–4.500)

## 2017-10-22 ENCOUNTER — Inpatient Hospital Stay
Admit: 2017-10-22 | Discharge: 2017-10-22 | Disposition: A | Discharge status: Home / Self Care | Payer: MEDICARE | Attending: Emergency Medicine

## 2017-10-22 DIAGNOSIS — T83022A Displacement of nephrostomy catheter, initial encounter: Secondary | ICD-10-CM

## 2017-10-22 NOTE — ED Notes (Signed)
Dressing changed to drainage site.

## 2017-10-22 NOTE — ED Provider Notes (Addendum)
72 year old female complains of some slight burning around the right flank over the last week.  Patient has very bladder cancer and has a cystectomy with ileal conduit 5 years ago.  She has been living in [place].  She evidently had and nephrostomy tube placed about 6 months ago in [place].  States that after the burning started she noticed a couple of "wires" sticking out of the orifice where the tube is coming from.  [facility] care doctor felt the sutures had come loose.  And was going to have a referral to interventional radiology to replace.  Patient is unaware of the specifics of that but states she has burning and needs to tube changed.  No fever no vomiting.  She has an ileal conduit in changes urine for that.  No decreased urine output.      The history is provided by the patient.   Other   This is a new problem. The current episode started more than 1 week ago. The problem occurs constantly. The problem has not changed since onset.Pertinent negatives include no chest pain, no abdominal pain, no headaches and no shortness of breath. Nothing aggravates the symptoms. Nothing relieves the symptoms.        Past Medical History:   Diagnosis Date   ??? Abdominal pain, unspecified site 10/27/2012   ??? Arthritis     generalized   ??? Asthma 10/27/2012   ??? Bladder cancer ([place])    ??? Depression     managed with medication    ??? Dysuria 10/27/2012   ??? Environmental allergies     managed with medication    ??? GERD (gastroesophageal reflux disease) 10/27/2012   ??? History of kidney stones     states surgical removed   ??? Hypothyroidism     managed with medication    ??? Other "heavy-for-dates" infants     ureterectomy and ileal conduit   ??? Unspecified disorder of bladder 10/27/2012   ??? Urethral caruncle 10/27/2012   ??? Urinary frequency 10/27/2012   ??? Urinary tract infection, site not specified 10/27/2012       Past Surgical History:   Procedure Laterality Date   ??? CLOSE CYSTOSTOMY      cystoscopy/ turbt x 2   ??? HX BREAST BIOPSY Left 2000     x 2   ??? HX CESAREAN SECTION      x 1   ??? HX CHOLECYSTECTOMY     ??? HX NEPHROSTOMY  2014   ??? HX TUBAL LIGATION     ??? HX WRIST FRACTURE TX Left          Family History:   Problem Relation Age of Onset   ??? Stroke Mother    ??? Heart Disease Mother    ??? Cancer Father    ??? Asthma Father    ??? Lung Disease Father         COPD/smoker   ??? Cancer Brother    ??? Heart Disease Brother    ??? Alcohol abuse Brother        Social History     Socioeconomic History   ??? Marital status: WIDOWED     Spouse name: Not on file   ??? Number of children: Not on file   ??? Years of education: Not on file   ??? Highest education level: Not on file   Social Needs   ??? Financial resource strain: Not on file   ??? Food insecurity - worry: Not on file   ???  Food insecurity - inability: Not on file   ??? Transportation needs - medical: Not on file   ??? Transportation needs - non-medical: Not on file   Occupational History   ??? Not on file   Tobacco Use   ??? Smoking status: Former Smoker     Packs/day: 0.50     Years: 20.00     Pack years: 10.00   ??? Smokeless tobacco: Never Used   ??? Tobacco comment: stopped at age 61   Substance and Sexual Activity   ??? Alcohol use: No   ??? Drug use: No   ??? Sexual activity: No   Other Topics Concern   ??? Not on file   Social History Narrative   ??? Not on file         ALLERGIES: Patient has no known allergies.    Review of Systems   Constitutional: Negative for chills and fever.   Respiratory: Negative for cough and shortness of breath.    Cardiovascular: Negative for chest pain.   Gastrointestinal: Negative for abdominal pain, nausea and vomiting.   Genitourinary: Negative for hematuria.   Neurological: Negative for headaches.       Vitals:    10/22/17 1603   BP: 147/88   Pulse: (!) 110   Resp: 16   Temp: 98 ??F (36.7 ??C)   SpO2: 95%            Physical Exam   Constitutional: She is oriented to person, place, and time. She appears well-developed and well-nourished. No distress.    Abdominal: Soft. There is no tenderness. There is no CVA tenderness.   Neurological: She is alert and oriented to person, place, and time.   Skin: Skin is warm and dry.   Right flank with nephrostomy tube exiting.  Some surrounding erythema with some purulence.  No crepitus.  No abscess. Nephrostomy is not draining.   Nursing note and vitals reviewed.       MDM  Number of Diagnoses or Management Options  Diagnosis management comments: Check with interventional radiology to see if patient is scheduled.  Check with urology.       Amount and/or Complexity of Data Reviewed  Review and summarize past medical records: yes    Risk of Complications, Morbidity, and/or Mortality  Presenting problems: moderate  Diagnostic procedures: minimal  Management options: low    Patient Progress  Patient progress: stable         Procedures      Patient states she does not change the bag attached to the nephrostomy tube.         6:15 PM  Discussed with interventional radiology and urology.  We'll make plans to have catheter changed out tomorrow.  Family states best contact number is [phone].

## 2017-10-22 NOTE — ED Triage Notes (Addendum)
Pt arrives via POV from home, pt ambulates to triage from home, pt states she had her bladder removed 6 years ago, pt states has wires loose to keep tubes in kidneys open, has to get the wires redone every 3 months, does not have a referral for urology. Pt states increased pain to site. Pt recent move from [place].

## 2017-10-22 NOTE — ED Notes (Signed)
I have reviewed discharge instructions with the patient.  The patient verbalized understanding.    Patient left ED via Discharge Method: ambulatory to Home with family.  Opportunity for questions and clarification provided.       Patient given 0 scripts.         To continue your aftercare when you leave the hospital, you may receive an automated call from our care team to check in on how you are doing.  This is a free service and part of our promise to provide the best care and service to meet your aftercare needs.??? If you have questions, or wish to unsubscribe from this service please call [phone].  Thank you for Choosing our [facility].

## 2017-10-23 ENCOUNTER — Encounter

## 2017-10-23 ENCOUNTER — Inpatient Hospital Stay: Admit: 2017-10-23 | Payer: MEDICARE | Attending: Diagnostic Radiology | Primary: Community Health

## 2017-10-23 DIAGNOSIS — T83022A Displacement of nephrostomy catheter, initial encounter: Secondary | ICD-10-CM

## 2017-10-23 MED ORDER — MIDAZOLAM 1 MG/ML IJ SOLN
1 mg/mL | INTRAMUSCULAR | Status: AC
Start: 2017-10-23 — End: ?

## 2017-10-23 MED ORDER — SILVER NITRATE APPLICATORS 75 %-25 % TOPICAL STICK
75-25 % | CUTANEOUS | Status: AC
Start: 2017-10-23 — End: ?

## 2017-10-23 MED ORDER — HYDROCODONE-ACETAMINOPHEN 7.5 MG-325 MG TAB
ORAL | Status: DC | PRN
Start: 2017-10-23 — End: 2017-10-27

## 2017-10-23 MED ORDER — DIPHENHYDRAMINE HCL 50 MG/ML IJ SOLN
50 mg/mL | INTRAMUSCULAR | Status: AC
Start: 2017-10-23 — End: ?

## 2017-10-23 MED ORDER — MIDAZOLAM 1 MG/ML IJ SOLN
1 mg/mL | INTRAMUSCULAR | Status: DC | PRN
Start: 2017-10-23 — End: 2017-10-23
  Administered 2017-10-23 (×2): via INTRAVENOUS

## 2017-10-23 MED ORDER — SILVER NITRATE APPLICATORS 75 %-25 % TOPICAL STICK
75-25 % | Freq: Once | CUTANEOUS | Status: AC
Start: 2017-10-23 — End: 2017-10-23
  Administered 2017-10-23: 20:00:00 via TOPICAL

## 2017-10-23 MED ORDER — IOPAMIDOL 61 % IV SOLN
300 mg iodine /mL (61 %) | Freq: Once | INTRAVENOUS | Status: AC
Start: 2017-10-23 — End: 2017-10-23
  Administered 2017-10-23: 20:00:00

## 2017-10-23 MED ORDER — LIDOCAINE HCL 2 % (20 MG/ML) IJ SOLN
20 mg/mL (2 %) | Freq: Once | INTRAMUSCULAR | Status: AC
Start: 2017-10-23 — End: 2017-10-23
  Administered 2017-10-23: 20:00:00 via INTRADERMAL

## 2017-10-23 MED ORDER — LIDOCAINE HCL 2 % (20 MG/ML) IJ SOLN
20 mg/mL (2 %) | INTRAMUSCULAR | Status: AC
Start: 2017-10-23 — End: ?

## 2017-10-23 MED ORDER — DIPHENHYDRAMINE HCL 50 MG/ML IJ SOLN
50 mg/mL | Freq: Once | INTRAMUSCULAR | Status: DC
Start: 2017-10-23 — End: 2017-10-23

## 2017-10-23 MED ORDER — SODIUM CHLORIDE 0.9 % IV
INTRAVENOUS | Status: DC
Start: 2017-10-23 — End: 2017-10-27
  Administered 2017-10-23: 19:00:00 via INTRAVENOUS

## 2017-10-23 MED ORDER — FENTANYL CITRATE (PF) 50 MCG/ML IJ SOLN
50 mcg/mL | INTRAMUSCULAR | Status: AC
Start: 2017-10-23 — End: ?

## 2017-10-23 MED ORDER — FENTANYL CITRATE (PF) 50 MCG/ML IJ SOLN
50 mcg/mL | INTRAMUSCULAR | Status: DC | PRN
Start: 2017-10-23 — End: 2017-10-23
  Administered 2017-10-23: 19:00:00 via INTRAVENOUS

## 2017-10-23 MED ORDER — SODIUM CHLORIDE 0.9 % IV
INTRAVENOUS | Status: AC
Start: 2017-10-23 — End: 2017-10-24

## 2017-10-23 MED FILL — FENTANYL CITRATE (PF) 50 MCG/ML IJ SOLN: 50 mcg/mL | INTRAMUSCULAR | Qty: 4

## 2017-10-23 MED FILL — DIPHENHYDRAMINE HCL 50 MG/ML IJ SOLN: 50 mg/mL | INTRAMUSCULAR | Qty: 1

## 2017-10-23 MED FILL — XYLOCAINE 20 MG/ML (2 %) INJECTION SOLUTION: 20 mg/mL (2 %) | INTRAMUSCULAR | Qty: 20

## 2017-10-23 MED FILL — SILVER NITRATE APPLICATORS 75 %-25 % TOPICAL STICK: 75-25 % | CUTANEOUS | Qty: 1

## 2017-10-23 MED FILL — MIDAZOLAM 1 MG/ML IJ SOLN: 1 mg/mL | INTRAMUSCULAR | Qty: 4

## 2017-10-23 NOTE — Progress Notes (Signed)
IR Nurse Pre-Procedure Checklist Part 2          Consent signed: Yes    H&P complete:  Yes    Antibiotics: Not applicable    Airway/Mallampati Done: Yes    Shaved: Not applicable    Pregnancy Form:Not applicable    Patient Position: Yes    MD Side: Yes     Biopsy Worksheet: Not applicable    Specimen Medium: Not applicable

## 2017-10-23 NOTE — Progress Notes (Signed)
Patient in procedure room at 1308, patient alert and orient no complaints of pain, patient to have sedation for this case.

## 2017-10-23 NOTE — Procedures (Signed)
Department of Interventional Radiology  [phone]      Interventional Radiology Brief Procedure NotePatient: RUMOR SUN MRN: [id number]  SSN: [id number]    Date of Birth: 1946-04-10  Age: 72 y.o.  Sex: female    Date of Procedure: 10/23/2017    Pre-Procedure Diagnosis: Bladder cancer.  Dysfunctional nephroureteral drain.      Post-Procedure Diagnosis: SAME    Procedure(s): Percutaneous Right Nephro-ureteral Tube Exchange  Brief Description of Procedure: as above  Performed By: Vladimir Creeks, MD     Assistants: None    Anesthesia:Moderate Sedation    Estimated Blood Loss: Less than 59ml    Specimens:  None    Implants:  Nephro-Ureteral Drain    Findings: UPJ stenosis.  Ureteral anastomosis not evaluated today.      Complications: None    Recommendations: 1 hour bedrest.  Internal drainage.      Follow Up: Convert to retrograde nephro-ureteral drain in 3 months.     Signed By: Vladimir Creeks, MD     October 23, 2017

## 2017-10-23 NOTE — H&P (Signed)
Department of Interventional Radiology  [phone]    History and PhysicalPatient:  Cathy Jackson MRN:  [id number]  SSN:  [id number]    Date of Birth:  01-Dec-1945  Age:  72 y.o.  Sex:  female      Primary Care Provider:  Wilfred Lacy, NP  Referring Physician:  Lorelee Cover, MD    Subjective:     Chief Complaint: nephrostomy tube malfunction    History of the Present Illness:  The patient is a 72 y.o. female who presents for evaluation and exchange of her right nephroureteral drain.  Drain has been maintained in [place], she just moved back to this area.  Dr. Myrna Blazer formed ilioconduit approx 5 years ago, bladder cancer.  She states she has always had the catheter exit her back.  Intermittent leaking at skin site.  Catheter tip can still be seen in conduit.  She is due for her routine exchange late March/early April.  NPO.       Past Medical History:   Diagnosis Date   ??? Abdominal pain, unspecified site 10/27/2012   ??? Arthritis     generalized   ??? Asthma 10/27/2012   ??? Bladder cancer ([place])    ??? Depression     managed with medication    ??? Dysuria 10/27/2012   ??? Environmental allergies     managed with medication    ??? GERD (gastroesophageal reflux disease) 10/27/2012   ??? History of kidney stones     states surgical removed   ??? Hypothyroidism     managed with medication    ??? Other "heavy-for-dates" infants     ureterectomy and ileal conduit   ??? Unspecified disorder of bladder 10/27/2012   ??? Urethral caruncle 10/27/2012   ??? Urinary frequency 10/27/2012   ??? Urinary tract infection, site not specified 10/27/2012     Past Surgical History:   Procedure Laterality Date   ??? CLOSE CYSTOSTOMY      cystoscopy/ turbt x 2   ??? HX BREAST BIOPSY Left 2000    x 2   ??? HX CESAREAN SECTION      x 1   ??? HX CHOLECYSTECTOMY     ??? HX NEPHROSTOMY  2014   ??? HX TUBAL LIGATION     ??? HX WRIST FRACTURE TX Left         Review of Systems:    Pertinent items are noted in the History of Present Illness.    Current Outpatient Medications    Medication Sig   ??? calcium carb-vitamin D3-vit K2 600 mg-1,000 unit-90 mcg tab Take  by mouth.   ??? cyanocobalamin 50 mcg tablet Take 50 mcg by mouth.   ??? divalproex (DEPAKOTE SPRINKLE) 125 mg capsule Take 250 mg by mouth.   ??? famotidine (PEPCID) 20 mg tablet Take 20 mg by mouth.   ??? memantine ER (NAMENDA XR) 28 mg capsule Take 28 mg by mouth. 2 tablets in the morning and 2 tablets in the evening.   ??? PARoxetine (PAXIL) 20 mg tablet Take 20 mg by mouth.   ??? senna-docusate (PERICOLACE) 8.6-50 mg per tablet Take  by mouth.   ??? calcium carbonate (OS-CAL) 500 mg calcium (1,250 mg) tablet Take  by mouth daily.   ??? ascorbic acid, vitamin C, (VITAMIN C) 1,000 mg tablet Take  by mouth.   ??? ferrous sulfate (IRON) 325 mg (65 mg iron) tablet Take  by mouth Daily (before breakfast).   ??? citalopram (CELEXA) 20 mg tablet Take  1 Tab by mouth daily.     Current Facility-Administered Medications   Medication Dose Route Frequency   ??? 0.9% sodium chloride infusion  25 mL/hr IntraVENous CONTINUOUS   ??? fentaNYL citrate (PF) injection 25-50 mcg  25-50 mcg IntraVENous Multiple   ??? diphenhydrAMINE (BENADRYL) injection 50 mg  50 mg IntraVENous ONCE   ??? midazolam (VERSED) injection 0.5-2 mg  0.5-2 mg IntraVENous Multiple   ??? lidocaine (XYLOCAINE) 20 mg/mL (2 %) injection 20-400 mg  20-400 mg IntraDERMal ONCE   ??? iopamidol (ISOVUE 300) 61 % contrast injection 50 mL  50 mL IntraCATHeter RAD ONCE        No Known Allergies    Family History   Problem Relation Age of Onset   ??? Stroke Mother    ??? Heart Disease Mother    ??? Cancer Father    ??? Asthma Father    ??? Lung Disease Father         COPD/smoker   ??? Cancer Brother    ??? Heart Disease Brother    ??? Alcohol abuse Brother      Social History     Tobacco Use   ??? Smoking status: Former Smoker     Packs/day: 0.50     Years: 20.00     Pack years: 10.00   ??? Smokeless tobacco: Never Used   ??? Tobacco comment: stopped at age 41   Substance Use Topics   ??? Alcohol use: No        Objective:        Physical Examination:    Vitals:    10/23/17 1217   BP: 145/65   Pulse: 84   Resp: 16   Temp: 98 ??F (36.7 ??C)   SpO2: 98%     Blood pressure 145/65, pulse 84, temperature 98 ??F (36.7 ??C), resp. rate 16, SpO2 98 %.  HEART: regular rate and rhythm  LUNG: clear to auscultation bilaterally  ABDOMEN: soft, nontender, ilioconduit with cloudy yellow urine with debris.  8.5 Fr. Catheter, right flank site looks good, some granulation tissue  EXTREMITIES: warm, no edema    Laboratory:     Lab Results   Component Value Date/Time    Sodium 143 10/20/2017 10:54 AM    Sodium 138 01/10/2016 10:19 AM    Potassium 5.1 10/20/2017 10:54 AM    Potassium 4.9 01/10/2016 10:19 AM    Chloride 101 10/20/2017 10:54 AM    Chloride 98 01/10/2016 10:19 AM    CO2 27 10/20/2017 10:54 AM    CO2 24 01/10/2016 10:19 AM    Anion gap 8 10/20/2012 05:38 AM    Anion gap 8 10/18/2012 05:47 AM    Glucose 99 10/20/2017 10:54 AM    Glucose 149 (H) 01/10/2016 10:19 AM    BUN 14 10/20/2017 10:54 AM    BUN 14 01/10/2016 10:19 AM    Creatinine 1.02 (H) 10/20/2017 10:54 AM    Creatinine 0.76 01/10/2016 10:19 AM    GFR est AA 63 10/20/2017 10:54 AM    GFR est AA 91 01/10/2016 10:19 AM    GFR est non-AA 55 (L) 10/20/2017 10:54 AM    GFR est non-AA 79 01/10/2016 10:19 AM    Calcium 9.4 10/20/2017 10:54 AM    Calcium 9.2 01/10/2016 10:19 AM    Magnesium 1.8 10/20/2012 05:38 AM    Magnesium 1.8 10/18/2012 05:47 AM    Albumin 4.1 10/20/2017 10:54 AM    Albumin 4.4 01/08/2016 04:16 PM  Protein, total 6.8 10/20/2017 10:54 AM    Protein, total 6.8 01/08/2016 04:16 PM    Globulin 4.3 (H) 10/11/2012 03:40 PM    Globulin 3.9 (H) 09/03/2012 01:32 PM    A-G Ratio 1.5 10/20/2017 10:54 AM    A-G Ratio 1.8 01/08/2016 04:16 PM    AST (SGOT) 18 10/20/2017 10:54 AM    AST (SGOT) 11 01/08/2016 04:16 PM    ALT (SGPT) 9 10/20/2017 10:54 AM    ALT (SGPT) 8 01/08/2016 04:16 PM     Lab Results   Component Value Date/Time    WBC 8.5 10/20/2017 10:54 AM    WBC 10.0 01/08/2016 04:16 PM     HGB 14.5 10/20/2017 10:54 AM    HGB 14.6 01/08/2016 04:16 PM    HCT 43.2 10/20/2017 10:54 AM    HCT 45.7 01/08/2016 04:16 PM    PLATELET 228 10/20/2017 10:54 AM    PLATELET 247 01/08/2016 04:16 PM     Lab Results   Component Value Date/Time    aPTT 25.7 10/11/2012 03:40 PM    Prothrombin time 10.6 10/11/2012 03:40 PM    INR 1.0 10/11/2012 03:40 PM       Assessment:     Bladder cancer, nephroureteral drain malfunction-although it appears intact    Plan:     Planned Procedure:  Nephroureteral drain exchange    Risks, benefits, and alternatives reviewed with patient and she agrees to proceed with the procedure.      Signed By: Oval Linsey, PA-C     October 23, 2017

## 2017-10-29 ENCOUNTER — Emergency Department: Admit: 2017-10-30 | Payer: MEDICARE | Primary: Community Health

## 2017-10-29 DIAGNOSIS — F0391 Unspecified dementia with behavioral disturbance: Secondary | ICD-10-CM

## 2017-10-29 NOTE — ED Triage Notes (Signed)
History of dementia.  Lives at home with daughter.  Daughter reported to EMS that mother has been getting away.  Calling people to take her places.  Unable to manage mother at this time.  Pt states she wants to get away from daughter.  EMS states no concerns while in home with patient and daughter.

## 2017-10-29 NOTE — ED Provider Notes (Addendum)
The history is provided by the patient and the EMS personnel.   Altered mental status    This is a recurrent problem. The current episode started more than 1 week ago. The problem has been gradually worsening. Associated symptoms include confusion and agitation. Pertinent negatives include no violence, no tingling and no numbness. Mental status baseline is mild dementia.  Risk factors include dementia. Her past medical history is significant for dementia. Her past medical history does not include seizures or head trauma.        Past Medical History:   Diagnosis Date   ??? Abdominal pain, unspecified site 10/27/2012   ??? Arthritis     generalized   ??? Asthma 10/27/2012   ??? Bladder cancer ([place])    ??? Depression     managed with medication    ??? Dysuria 10/27/2012   ??? Environmental allergies     managed with medication    ??? GERD (gastroesophageal reflux disease) 10/27/2012   ??? History of kidney stones     states surgical removed   ??? Hypothyroidism     managed with medication    ??? Other "heavy-for-dates" infants     ureterectomy and ileal conduit   ??? Unspecified disorder of bladder 10/27/2012   ??? Urethral caruncle 10/27/2012   ??? Urinary frequency 10/27/2012   ??? Urinary tract infection, site not specified 10/27/2012       Past Surgical History:   Procedure Laterality Date   ??? CLOSE CYSTOSTOMY      cystoscopy/ turbt x 2   ??? HX BREAST BIOPSY Left 2000    x 2   ??? HX CESAREAN SECTION      x 1   ??? HX CHOLECYSTECTOMY     ??? HX NEPHROSTOMY  2014   ??? HX TUBAL LIGATION     ??? HX WRIST FRACTURE TX Left    ??? IR EXCHANGE NEPHRO PERC RT SI  10/23/2017         Family History:   Problem Relation Age of Onset   ??? Stroke Mother    ??? Heart Disease Mother    ??? Cancer Father    ??? Asthma Father    ??? Lung Disease Father         COPD/smoker   ??? Cancer Brother    ??? Heart Disease Brother    ??? Alcohol abuse Brother        Social History     Socioeconomic History   ??? Marital status: WIDOWED     Spouse name: Not on file   ??? Number of children: Not on file    ??? Years of education: Not on file   ??? Highest education level: Not on file   Social Needs   ??? Financial resource strain: Not on file   ??? Food insecurity - worry: Not on file   ??? Food insecurity - inability: Not on file   ??? Transportation needs - medical: Not on file   ??? Transportation needs - non-medical: Not on file   Occupational History   ??? Not on file   Tobacco Use   ??? Smoking status: Former Smoker     Packs/day: 0.50     Years: 20.00     Pack years: 10.00   ??? Smokeless tobacco: Never Used   ??? Tobacco comment: stopped at age 51   Substance and Sexual Activity   ??? Alcohol use: No   ??? Drug use: No   ??? Sexual activity: No  Other Topics Concern   ??? Not on file   Social History Narrative   ??? Not on file         ALLERGIES: Patient has no known allergies.    Review of Systems   Unable to perform ROS: Dementia   Constitutional: Negative for chills and fever.   HENT: Negative for congestion, ear pain, nosebleeds and rhinorrhea.    Eyes: Negative for photophobia and discharge.   Respiratory: Negative for cough and shortness of breath.    Cardiovascular: Negative for chest pain and palpitations.   Gastrointestinal: Negative for abdominal pain, constipation, diarrhea and vomiting.   Endocrine: Negative for cold intolerance and heat intolerance.   Genitourinary: Negative for dysuria and flank pain.   Musculoskeletal: Negative for arthralgias, myalgias and neck pain.   Skin: Negative for rash and wound.   Allergic/Immunologic: Negative for environmental allergies and food allergies.   Neurological: Negative for tingling, syncope, numbness and headaches.   Hematological: Negative for adenopathy. Does not bruise/bleed easily.   Psychiatric/Behavioral: Positive for agitation and confusion. Negative for dysphoric mood. The patient is not nervous/anxious.    All other systems reviewed and are negative.      Vitals:    10/29/17 2050   BP: 131/74   Pulse: (!) 108   Resp: 18   Temp: 98.4 ??F (36.9 ??C)   SpO2: 95%    Weight: 56.7 kg (125 lb)   Height: '5\' 2"'  (1.575 m)            Physical Exam   Constitutional: She is oriented to person, place, and time. She appears well-developed and well-nourished. She appears distressed.   HENT:   Head: Normocephalic and atraumatic.   Right Ear: External ear normal.   Left Ear: External ear normal.   Mouth/Throat: Oropharynx is clear and moist. No oropharyngeal exudate.   Eyes: Conjunctivae and EOM are normal. Pupils are equal, round, and reactive to light.   Neck: Normal range of motion. Neck supple. No JVD present.   Cardiovascular: Normal rate, regular rhythm, normal heart sounds and intact distal pulses. Exam reveals no gallop and no friction rub.   No murmur heard.  Pulmonary/Chest: Effort normal and breath sounds normal.   Abdominal: Soft. Normal appearance and bowel sounds are normal. She exhibits no distension and no mass. There is no hepatosplenomegaly. There is no tenderness.   Musculoskeletal: Normal range of motion. She exhibits no edema or deformity.   Neurological: She is alert and oriented to person, place, and time. She has normal strength. No cranial nerve deficit or sensory deficit. She displays a negative Romberg sign. Gait normal.   Skin: Skin is warm and dry. Capillary refill takes less than 2 seconds. No rash noted.   Psychiatric: She has a normal mood and affect. Her speech is normal and behavior is normal. Judgment and thought content normal. She is not actively hallucinating. She exhibits abnormal recent memory. She is attentive.   Nursing note and vitals reviewed.       MDM  Number of Diagnoses or Management Options  Dementia with behavioral disturbance, unspecified dementia type: established and worsening  Diagnosis management comments: EKG reviewed    Sinus rhythm, normal axis, normal intervals, no ectopy  No acute ischemic changes  No prior tracings available for comparison      72 year old female brought for evaluation of altered mental status   EMS reported the patient has been wandering and difficult to deal with for her family members  No recent  falls or injuries    Patient states "if I have to go to a facility.  I wanted to do it in [place]."    Patient's urinalysis tonight is consistent with infection, however, given her ileostomy and nephrostomy tubes.  She is likely to have chronic infected appearing urine  We will check her blood counts as well as a lactic acid for evidence of an acute infectious process.      12:26 AM  Medical workup is completed.  No signs of systemic infection with normal white blood cell count and negative lactic acid  Chest x-ray unremarkable    Patient will be held this morning until our social work staff is available to interview the patient and family to discuss appropriate disposition      11:43 AM  We conducted a telepsych consult. They want to change some of the patient's medications and states she is okay for discharge with seeing outpatient psychiatry. We'll also treat her UTI and discharge.         Amount and/or Complexity of Data Reviewed  Clinical lab tests: ordered and reviewed  Tests in the radiology section of CPT??: ordered and reviewed  Tests in the medicine section of CPT??: ordered and reviewed  Review and summarize past medical records: yes    Risk of Complications, Morbidity, and/or Mortality  Presenting problems: moderate  Diagnostic procedures: moderate  Management options: moderate  General comments: Elements of this note have been dictated via voice recognition software.  Text and phrases may be limited by the accuracy of the software.  The chart has been reviewed, but errors may still be present.      Patient Progress  Patient progress: stable         Procedures      Results Include:    Recent Results (from the past 24 hour(s))   URINE MICROSCOPIC    Collection Time: 10/29/17 10:24 PM   Result Value Ref Range    WBC >100 (H) 0 /hpf    RBC 50-100 0 /hpf    Epithelial cells 0-3 0 /hpf     Bacteria 3+ (H) 0 /hpf    Casts 0 0 /lpf    Amorphous Crystals 2+ (H) 0    Mucus 1+ (H) 0 /lpf    Other observations RESULTS VERIFIED MANUALLY     CBC WITH AUTOMATED DIFF    Collection Time: 10/29/17 11:25 PM   Result Value Ref Range    WBC 9.0 4.3 - 11.1 K/uL    RBC 4.74 4.05 - 5.2 M/uL    HGB 14.0 11.7 - 15.4 g/dL    HCT 44.5 35.8 - 46.3 %    MCV 93.9 79.6 - 97.8 FL    MCH 29.5 26.1 - 32.9 PG    MCHC 31.5 31.4 - 35.0 g/dL    RDW 13.6 11.9 - 14.6 %    PLATELET 218 150 - 450 K/uL    MPV 10.3 9.4 - 12.3 FL    ABSOLUTE NRBC 0.00 0.0 - 0.2 K/uL    DF AUTOMATED      NEUTROPHILS 64 43 - 78 %    LYMPHOCYTES 24 13 - 44 %    MONOCYTES 9 4.0 - 12.0 %    EOSINOPHILS 2 0.5 - 7.8 %    BASOPHILS 1 0.0 - 2.0 %    IMMATURE GRANULOCYTES 0 0.0 - 5.0 %    ABS. NEUTROPHILS 5.8 1.7 - 8.2 K/UL    ABS. LYMPHOCYTES 2.2 0.5 -  4.6 K/UL    ABS. MONOCYTES 0.8 0.1 - 1.3 K/UL    ABS. EOSINOPHILS 0.2 0.0 - 0.8 K/UL    ABS. BASOPHILS 0.1 0.0 - 0.2 K/UL    ABS. IMM. GRANS. 0.0 0.0 - 0.5 K/UL   METABOLIC PANEL, COMPREHENSIVE    Collection Time: 10/29/17 11:25 PM   Result Value Ref Range    Sodium 139 136 - 145 mmol/L    Potassium 5.3 (H) 3.5 - 5.1 mmol/L    Chloride 106 98 - 107 mmol/L    CO2 24 21 - 32 mmol/L    Anion gap 9 7 - 16 mmol/L    Glucose 115 (H) 65 - 100 mg/dL    BUN 15 8 - 23 MG/DL    Creatinine 0.93 0.6 - 1.0 MG/DL    GFR est AA >60 >60 ml/min/1.52m    GFR est non-AA >60 >60 ml/min/1.764m   Calcium 8.7 8.3 - 10.4 MG/DL    Bilirubin, total 0.4 0.2 - 1.1 MG/DL    ALT (SGPT) 16 12 - 65 U/L    AST (SGOT) 30 15 - 37 U/L    Alk. phosphatase 77 50 - 136 U/L    Protein, total 7.5 6.3 - 8.2 g/dL    Albumin 3.2 3.2 - 4.6 g/dL    Globulin 4.3 (H) 2.3 - 3.5 g/dL    A-G Ratio 0.7 (L) 1.2 - 3.5     EKG, 12 LEAD, INITIAL    Collection Time: 10/29/17 11:35 PM   Result Value Ref Range    Ventricular Rate 84 BPM    Atrial Rate 84 BPM    P-R Interval 144 ms    QRS Duration 76 ms    Q-T Interval 348 ms    QTC Calculation (Bezet) 411 ms     Calculated P Axis 75 degrees    Calculated R Axis 63 degrees    Calculated T Axis 64 degrees    Diagnosis       !! AGE AND GENDER SPECIFIC ECG ANALYSIS !!  Normal sinus rhythm  nonspecific ST abnormality   Abnormal ECG  No previous ECGs available  Confirmed by CEBE  MD ([facility]), JOHN E ([phone] 10/30/2017 8:04:26 AM     POC LACTIC ACID    Collection Time: 10/30/17 12:04 AM   Result Value Ref Range    Lactic Acid (POC) 0.66 0.5 - 1.9 mmol/L     XR CHEST PORT (Final result)   Result time 10/30/17 00:23:06   Final result by ChLenor DerrickMD (10/30/17 00:23:06)                Impression:    IMPRESSION:    Negative chest x-ray.            Narrative:    Portable chest xray ??    COMPARISON: October 11 2012.    Clinical history: Tachycardia.    FINDINGS:    No focal pulmonary consolidation, pneumothorax or pulmonary edema. No large  pleural effusion. Cardiac mediastinal contour is within normal limits. Bones are  osteopenic.

## 2017-10-30 ENCOUNTER — Inpatient Hospital Stay
Admit: 2017-10-30 | Discharge: 2017-10-30 | Disposition: A | Discharge status: Home / Self Care | Payer: MEDICARE | Attending: Emergency Medicine

## 2017-10-30 LAB — URINE MICROSCOPIC
Casts: 0 /lpf
WBC: >100 /hpf — ABNORMAL HIGH

## 2017-10-30 LAB — EKG, 12 LEAD, INITIAL
Atrial Rate: 84 {beats}/min
Calculated P Axis: 75 degrees
Calculated R Axis: 63 degrees
Calculated T Axis: 64 degrees
P-R Interval: 144 ms
Q-T Interval: 348 ms
QRS Duration: 76 ms
QTC Calculation (Bezet): 411 ms
Ventricular Rate: 84 {beats}/min

## 2017-10-30 LAB — METABOLIC PANEL, COMPREHENSIVE
A-G Ratio: 0.7 — ABNORMAL LOW (ref 1.2–3.5)
ALT (SGPT): 16 U/L (ref 12–65)
AST (SGOT): 30 U/L (ref 15–37)
Albumin: 3.2 g/dL (ref 3.2–4.6)
Alk. phosphatase: 77 U/L (ref 50–136)
Anion gap: 9 mmol/L (ref 7–16)
BUN: 15 MG/DL (ref 8–23)
Bilirubin, total: 0.4 MG/DL (ref 0.2–1.1)
CO2: 24 mmol/L (ref 21–32)
Calcium: 8.7 MG/DL (ref 8.3–10.4)
Chloride: 106 mmol/L (ref 98–107)
Creatinine: 0.93 MG/DL (ref 0.6–1.0)
GFR est AA: >60 mL/min/{1.73_m2} (ref >60)
GFR est non-AA: >60 mL/min/{1.73_m2} (ref >60)
Globulin: 4.3 g/dL — ABNORMAL HIGH (ref 2.3–3.5)
Glucose: 115 mg/dL — ABNORMAL HIGH (ref 65–100)
Potassium: 5.3 mmol/L — ABNORMAL HIGH (ref 3.5–5.1)
Protein, total: 7.5 g/dL (ref 6.3–8.2)
Sodium: 139 mmol/L (ref 136–145)

## 2017-10-30 LAB — CBC WITH AUTOMATED DIFF
ABS. BASOPHILS: 0.1 10*3/uL (ref 0.0–0.2)
ABS. EOSINOPHILS: 0.2 10*3/uL (ref 0.0–0.8)
ABS. IMM. GRANS.: 0 10*3/uL (ref 0.0–0.5)
ABS. LYMPHOCYTES: 2.2 10*3/uL (ref 0.5–4.6)
ABS. MONOCYTES: 0.8 10*3/uL (ref 0.1–1.3)
ABS. NEUTROPHILS: 5.8 10*3/uL (ref 1.7–8.2)
ABSOLUTE NRBC: 0 10*3/uL (ref 0.0–0.2)
BASOPHILS: 1 % (ref 0.0–2.0)
EOSINOPHILS: 2 % (ref 0.5–7.8)
HCT: 44.5 % (ref 35.8–46.3)
HGB: 14 g/dL (ref 11.7–15.4)
IMMATURE GRANULOCYTES: 0 % (ref 0.0–5.0)
LYMPHOCYTES: 24 % (ref 13–44)
MCH: 29.5 PG (ref 26.1–32.9)
MCHC: 31.5 g/dL (ref 31.4–35.0)
MCV: 93.9 FL (ref 79.6–97.8)
MONOCYTES: 9 % (ref 4.0–12.0)
MPV: 10.3 FL (ref 9.4–12.3)
NEUTROPHILS: 64 % (ref 43–78)
PLATELET: 218 10*3/uL (ref 150–450)
RBC: 4.74 M/uL (ref 4.05–5.2)
RDW: 13.6 % (ref 11.9–14.6)
WBC: 9 10*3/uL (ref 4.3–11.1)

## 2017-10-30 LAB — POC LACTIC ACID: Lactic Acid (POC): 0.66 mmol/L (ref 0.5–1.9)

## 2017-10-30 MED ORDER — DIVALPROEX 125 MG SPRINKLE CAP
125 mg | ORAL_CAPSULE | Freq: Every evening | ORAL | 0 refills | Status: DC
Start: 2017-10-30 — End: 2017-11-12

## 2017-10-30 MED ORDER — MEMANTINE 28 MG SPRINKLE ER 24HR CAPSULE
28 mg | ORAL_CAPSULE | Freq: Every day | ORAL | 0 refills | Status: DC
Start: 2017-10-30 — End: 2018-03-01

## 2017-10-30 MED ORDER — SODIUM CHLORIDE 0.9 % IJ SYRG
Freq: Three times a day (TID) | INTRAMUSCULAR | Status: DC
Start: 2017-10-30 — End: 2017-10-30

## 2017-10-30 MED ORDER — SODIUM CHLORIDE 0.9 % IJ SYRG
INTRAMUSCULAR | Status: DC | PRN
Start: 2017-10-30 — End: 2017-10-30

## 2017-10-30 NOTE — ED Notes (Signed)
Patient in bed resting quietly.  Vital signs cycling.

## 2017-10-30 NOTE — ED Notes (Signed)
I have reviewed discharge instructions with the patient.  The patient verbalized understanding.    Patient left ED via Discharge Method: ambulatory to Home with daughter.     Opportunity for questions and clarification provided.       Patient given 2 scripts.         To continue your aftercare when you leave the hospital, you may receive an automated call from our care team to check in on how you are doing.  This is a free service and part of our promise to provide the best care and service to meet your aftercare needs.??? If you have questions, or wish to unsubscribe from this service please call [phone].  Thank you for Choosing our [facility].

## 2017-10-30 NOTE — ED Notes (Signed)
Report received from Remi, RN.  Care assumed at this time.

## 2017-10-30 NOTE — ED Notes (Signed)
Per Caryl Pina, RN charge nurse, this RN to take patient discharge paperwork and check vitals only. This RN not assuming patient care.

## 2017-10-30 NOTE — ED Notes (Signed)
Spoke to patient's daughter. Patient's daughter is going to come to ED in AM to be here for meeting with social work

## 2017-10-30 NOTE — Progress Notes (Addendum)
Spoke with patients daughter Earnest Bailey [phone]).  Per [facility], patient has been diagnosed with dementia recently / has been having an increased in agitation and combative.  Per Earnest Bailey, patient has just recently moved here with her / she has lived with multiple family members / back and forth about where she wants to live / leaves the house / gets in cars with strangers / recently physically combative / not taking medications.   Daughter states patient does not have money for ALF / memory care / patient only has [employer].  States she has reached out to the [facility] program but has not heard back.  Daughter feels she cant keep patient safe at home unless she is "stabilized" on medication.  Daughter would like tele-psych evaluation.  MD in agreement.  Secretary gave tele-psych the daughters contact information to call prior to evaluation with patient to get background information.    Patient moved to room 11 so sitters can assist in watching her as daughter had to leave to go to work for [employer].    Daughter states she is NOT the POA.

## 2017-10-30 NOTE — Progress Notes (Signed)
SW provided patient's daughter with contact info for the [facility]). She's agreeable to follow-up. The patient's daughter also works for a [employer] information systems officer.

## 2017-10-30 NOTE — ED Notes (Signed)
This RN spoke with Earnest Bailey (patient's daughter).  Daughter is trying to get off work to come pick up her mother.

## 2017-10-30 NOTE — ED Notes (Signed)
Lunch tray given to patient.

## 2017-11-06 ENCOUNTER — Other Ambulatory Visit: Payer: Self-pay | Admitting: Family Medicine

## 2017-11-06 NOTE — Telephone Encounter (Signed)
We rescheduled pt to after 4/1 for stent removal however daughter called back and says that she had made that appt because the stent looks like it is already starting to come out.  She said that we pushed it back because she had stent exchanged  In hospital. She saw how the stent was doing and didn't take her back to ER because she new she had this appt coming up. Please call daughter ( pt has dementia) and let her know what we can do.

## 2017-11-06 NOTE — Telephone Encounter (Signed)
You saw this patient 3 yrs ago.  She has had a lot of changes to her medical diagnoses while in [place] from when you saw her last. Please review records from [place] (scanned in [place] on February 10/09/17) Her complaint is the nephrostomy tube in the back appears to be in wrong since IR ([facility]) did it and they say it is bothering her.  I told her daughter,  Earnest Bailey,  [place] would take care of this but she insists that you know all about her and they want to see you. I cant tell if she still has a stent or not. Please review records and tell me what to do.

## 2017-11-07 NOTE — Telephone Encounter (Signed)
I spoke to daughter and reviewed records.  She will call IR Monday to discuss securing tube.  I asked her to discuss placement of a nephroureteral stent that can be changed via her conduit.

## 2017-11-09 ENCOUNTER — Encounter: Payer: MEDICARE | Attending: Urology | Primary: Community Health

## 2017-11-12 ENCOUNTER — Inpatient Hospital Stay: Admit: 2017-11-12 | Payer: MEDICARE | Attending: Diagnostic Radiology | Primary: Community Health

## 2017-11-12 ENCOUNTER — Encounter

## 2017-11-12 DIAGNOSIS — C679 Malignant neoplasm of bladder, unspecified: Secondary | ICD-10-CM

## 2017-11-12 MED ORDER — IOPAMIDOL 61 % IV SOLN
300 mg iodine /mL (61 %) | Freq: Once | INTRAVENOUS | Status: AC
Start: 2017-11-12 — End: 2017-11-12
  Administered 2017-11-12: 13:00:00 via INTRAVENOUS

## 2017-11-12 MED ORDER — FENTANYL CITRATE (PF) 50 MCG/ML IJ SOLN
50 mcg/mL | INTRAMUSCULAR | Status: AC
Start: 2017-11-12 — End: ?

## 2017-11-12 MED ORDER — SODIUM CHLORIDE 0.9 % IV
INTRAVENOUS | Status: DC
Start: 2017-11-12 — End: 2017-11-13

## 2017-11-12 MED ORDER — SODIUM CHLORIDE 0.9 % IV
Freq: Once | INTRAVENOUS | Status: AC
Start: 2017-11-12 — End: 2017-11-12
  Administered 2017-11-12: 13:00:00 via INTRAVENOUS

## 2017-11-12 MED ORDER — HYDROCODONE-ACETAMINOPHEN 7.5 MG-325 MG TAB
ORAL | Status: DC | PRN
Start: 2017-11-12 — End: 2017-11-15

## 2017-11-12 MED ORDER — MIDAZOLAM 1 MG/ML IJ SOLN
1 mg/mL | INTRAMUSCULAR | Status: AC
Start: 2017-11-12 — End: ?

## 2017-11-12 MED ORDER — HEPARIN (PORCINE) IN NS (PF) 2,000 UNIT/1,000 ML IV
2000 unit/1,000 mL | INTRAVENOUS | Status: DC | PRN
Start: 2017-11-12 — End: 2017-11-12

## 2017-11-12 MED ORDER — MIDAZOLAM 1 MG/ML IJ SOLN
1 mg/mL | INTRAMUSCULAR | Status: DC | PRN
Start: 2017-11-12 — End: 2017-11-15
  Administered 2017-11-12: 13:00:00 via INTRAVENOUS

## 2017-11-12 MED ORDER — LIDOCAINE (PF) 20 MG/ML (2 %) IJ SOLN
20 mg/mL (2 %) | INTRAMUSCULAR | Status: AC
Start: 2017-11-12 — End: ?

## 2017-11-12 MED ORDER — LIDOCAINE (PF) 20 MG/ML (2 %) IJ SOLN
20 mg/mL (2 %) | Freq: Once | INTRAMUSCULAR | Status: AC
Start: 2017-11-12 — End: 2017-11-12
  Administered 2017-11-12: 13:00:00 via INTRADERMAL

## 2017-11-12 MED ORDER — FENTANYL CITRATE (PF) 50 MCG/ML IJ SOLN
50 mcg/mL | INTRAMUSCULAR | Status: DC | PRN
Start: 2017-11-12 — End: 2017-11-15
  Administered 2017-11-12: 13:00:00 via INTRAVENOUS

## 2017-11-12 MED ORDER — HEPARIN (PORCINE) IN NS (PF) 2,000 UNIT/1,000 ML IV
2000 unit/1,000 mL | INTRAVENOUS | Status: AC
Start: 2017-11-12 — End: ?

## 2017-11-12 MED FILL — LIDOCAINE (PF) 20 MG/ML (2 %) IJ SOLN: 20 mg/mL (2 %) | INTRAMUSCULAR | Qty: 10

## 2017-11-12 MED FILL — MIDAZOLAM 1 MG/ML IJ SOLN: 1 mg/mL | INTRAMUSCULAR | Qty: 4

## 2017-11-12 MED FILL — HEPARIN (PORCINE) IN NS (PF) 2,000 UNIT/1,000 ML IV: 2000 unit/1,000 mL | INTRAVENOUS | Qty: 1000

## 2017-11-12 MED FILL — FENTANYL CITRATE (PF) 50 MCG/ML IJ SOLN: 50 mcg/mL | INTRAMUSCULAR | Qty: 4

## 2017-11-12 NOTE — H&P (Signed)
Department of Interventional Radiology  [phone]    History and PhysicalPatient:  Cathy Jackson MRN:  [id number]  SSN:  [id number]    Date of Birth:  02/11/1946  Age:  72 y.o.  Sex:  female      Primary Care Provider:  Wilfred Lacy, NP  Referring Physician:  Vladimir Creeks, MD    Subjective:     Chief Complaint: drain manipulation    History of the Present Illness:  The patient is a 72 y.o. female who presents for nephrostogram, convert nephroureteral drain do retrograde. Bladder cancer, ilioconduit.  No new complaints.  NPO.     Past Medical History:   Diagnosis Date   ??? Abdominal pain, unspecified site 10/27/2012   ??? Arthritis     generalized   ??? Asthma 10/27/2012   ??? Bladder cancer ([place])    ??? Depression     managed with medication    ??? Dysuria 10/27/2012   ??? Environmental allergies     managed with medication    ??? GERD (gastroesophageal reflux disease) 10/27/2012   ??? History of kidney stones     states surgical removed   ??? Hypothyroidism     managed with medication    ??? Other "heavy-for-dates" infants     ureterectomy and ileal conduit   ??? Unspecified disorder of bladder 10/27/2012   ??? Urethral caruncle 10/27/2012   ??? Urinary frequency 10/27/2012   ??? Urinary tract infection, site not specified 10/27/2012     Past Surgical History:   Procedure Laterality Date   ??? CLOSE CYSTOSTOMY      cystoscopy/ turbt x 2   ??? HX BREAST BIOPSY Left 2000    x 2   ??? HX CESAREAN SECTION      x 1   ??? HX CHOLECYSTECTOMY     ??? HX NEPHROSTOMY  2014   ??? HX TUBAL LIGATION     ??? HX WRIST FRACTURE TX Left    ??? IR EXCHANGE NEPHRO PERC RT SI  10/23/2017        Review of Systems:    Pertinent items are noted in the History of Present Illness.    Current Outpatient Medications   Medication Sig   ??? divalproex DR (DEPAKOTE) 250 mg tablet Take 250 mg by mouth once over twenty-four (24) hours.   ??? memantine ER (NAMENDA XR) 28 mg capsule Take 1 Cap by mouth daily. 2 tablets in the morning and 2 tablets in the evening.    ??? calcium carb-vitamin D3-vit K2 600 mg-1,000 unit-90 mcg tab Take  by mouth.   ??? cyanocobalamin 50 mcg tablet Take 50 mcg by mouth.   ??? famotidine (PEPCID) 20 mg tablet Take 20 mg by mouth.   ??? PARoxetine (PAXIL) 20 mg tablet Take 20 mg by mouth.   ??? senna-docusate (PERICOLACE) 8.6-50 mg per tablet Take  by mouth.   ??? calcium carbonate (OS-CAL) 500 mg calcium (1,250 mg) tablet Take  by mouth daily.   ??? ascorbic acid, vitamin C, (VITAMIN C) 1,000 mg tablet Take  by mouth.   ??? ferrous sulfate (IRON) 325 mg (65 mg iron) tablet Take  by mouth Daily (before breakfast).     Current Facility-Administered Medications   Medication Dose Route Frequency   ??? midazolam (VERSED) injection 0.5-2 mg  0.5-2 mg IntraVENous Rad Multiple   ??? fentaNYL citrate (PF) injection 25-50 mcg  25-50 mcg IntraVENous Rad Multiple   ??? 0.9% sodium chloride infusion  25 mL/hr  IntraVENous ONCE   ??? lidocaine (PF) (XYLOCAINE) 20 mg/mL (2 %) injection 20-400 mg  1-20 mL IntraDERMal ONCE        No Known Allergies    Family History   Problem Relation Age of Onset   ??? Stroke Mother    ??? Heart Disease Mother    ??? Cancer Father    ??? Asthma Father    ??? Lung Disease Father         COPD/smoker   ??? Cancer Brother    ??? Heart Disease Brother    ??? Alcohol abuse Brother      Social History     Tobacco Use   ??? Smoking status: Former Smoker     Packs/day: 0.50     Years: 20.00     Pack years: 10.00   ??? Smokeless tobacco: Never Used   ??? Tobacco comment: stopped at age 73   Substance Use Topics   ??? Alcohol use: No        Objective:       Physical Examination:    Vitals:    11/12/17 0822 11/12/17 0835   BP: 130/73    Pulse: 96    Resp: 16    Temp: 98 ??F (36.7 ??C)    SpO2: 96%    Weight:  66.2 kg (146 lb)   Height:  5\' 1"  (1.549 m)     Blood pressure 130/73, pulse 96, temperature 98 ??F (36.7 ??C), resp. rate 16, height 5\' 1"  (1.549 m), weight 66.2 kg (146 lb), SpO2 96 %.  HEART: regular rate and rhythm  LUNG: clear to auscultation bilaterally   ABDOMEN: soft, nontender, right flank dressing dry and intact  EXTREMITIES: warm, no edema    Laboratory:     Lab Results   Component Value Date/Time    Sodium 139 10/29/2017 11:25 PM    Sodium 143 10/20/2017 10:54 AM    Potassium 5.3 (H) 10/29/2017 11:25 PM    Potassium 5.1 10/20/2017 10:54 AM    Chloride 106 10/29/2017 11:25 PM    Chloride 101 10/20/2017 10:54 AM    CO2 24 10/29/2017 11:25 PM    CO2 27 10/20/2017 10:54 AM    Anion gap 9 10/29/2017 11:25 PM    Anion gap 8 10/20/2012 05:38 AM    Glucose 115 (H) 10/29/2017 11:25 PM    Glucose 99 10/20/2017 10:54 AM    BUN 15 10/29/2017 11:25 PM    BUN 14 10/20/2017 10:54 AM    Creatinine 0.93 10/29/2017 11:25 PM    Creatinine 1.02 (H) 10/20/2017 10:54 AM    GFR est AA >60 10/29/2017 11:25 PM    GFR est AA 63 10/20/2017 10:54 AM    GFR est non-AA >60 10/29/2017 11:25 PM    GFR est non-AA 55 (L) 10/20/2017 10:54 AM    Calcium 8.7 10/29/2017 11:25 PM    Calcium 9.4 10/20/2017 10:54 AM    Magnesium 1.8 10/20/2012 05:38 AM    Magnesium 1.8 10/18/2012 05:47 AM    Albumin 3.2 10/29/2017 11:25 PM    Albumin 4.1 10/20/2017 10:54 AM    Protein, total 7.5 10/29/2017 11:25 PM    Protein, total 6.8 10/20/2017 10:54 AM    Globulin 4.3 (H) 10/29/2017 11:25 PM    Globulin 4.3 (H) 10/11/2012 03:40 PM    A-G Ratio 0.7 (L) 10/29/2017 11:25 PM    A-G Ratio 1.5 10/20/2017 10:54 AM    AST (SGOT) 30 10/29/2017 11:25 PM  AST (SGOT) 18 10/20/2017 10:54 AM    ALT (SGPT) 16 10/29/2017 11:25 PM    ALT (SGPT) 9 10/20/2017 10:54 AM     Lab Results   Component Value Date/Time    WBC 9.0 10/29/2017 11:25 PM    WBC 8.5 10/20/2017 10:54 AM    HGB 14.0 10/29/2017 11:25 PM    HGB 14.5 10/20/2017 10:54 AM    HCT 44.5 10/29/2017 11:25 PM    HCT 43.2 10/20/2017 10:54 AM    PLATELET 218 10/29/2017 11:25 PM    PLATELET 228 10/20/2017 10:54 AM     Lab Results   Component Value Date/Time    aPTT 25.7 10/11/2012 03:40 PM    Prothrombin time 10.6 10/11/2012 03:40 PM    INR 1.0 10/11/2012 03:40 PM        Assessment:     Bladder cancer, ureteral obstruction, hydronephrosis    Plan:     Planned Procedure:  Convert drain to retrograde nephroureteral drain    Risks, benefits, and alternatives reviewed with patient and she agrees to proceed with the procedure.      Signed By: Oval Linsey, PA-C     November 12, 2017

## 2017-11-12 NOTE — Procedures (Signed)
Department of Interventional Radiology  [phone]      Interventional Radiology Brief Procedure NotePatient: Cathy Jackson MRN: [id number]  SSN: [id number]    Date of Birth: 1946-07-20  Age: 72 y.o.  Sex: female    Date of Procedure: 11/12/2017    Pre-Procedure Diagnosis: Ureteral stricture.  Bladder cancer.  Cystectomy.      Post-Procedure Diagnosis: SAME    Procedure(s): Place a retrograde ureteral drain.    Brief Description of Procedure: as above  Performed By: Vladimir Creeks, MD     Assistants: None    Anesthesia:Moderate Sedation    Estimated Blood Loss: None    Specimens:  None    Implants:  Internal/External Biliary Drain used as a ureteral stent    Findings: No hydronephrosis.  Mucous in the urine.      Complications: None    Recommendations: 1 hour bedrest.       Follow Up: 3 months.      Signed By: Vladimir Creeks, MD     November 12, 2017

## 2017-11-12 NOTE — Progress Notes (Signed)
TRANSFER - OUT REPORT:    Verbal report given to Candice, RN(name) on Britni Driscoll  being transferred to IR Recovery(unit) for routine post - op       Report consisted of patient???s Situation, Background, Assessment and   Recommendations(SBAR).     Information from the following report(s) SBAR, Kardex, Procedure Summary and MAR was reviewed with the receiving nurse.    Lines:   Peripheral IV 11/12/17 Right Antecubital (Active)   Site Assessment Clean, dry, & intact 11/12/2017  8:43 AM   Phlebitis Assessment 0 11/12/2017  8:43 AM   Infiltration Assessment 0 11/12/2017  8:43 AM   Dressing Status Clean, dry, & intact 11/12/2017  8:43 AM   Dressing Type Tape;Transparent 11/12/2017  8:43 AM   Hub Color/Line Status Infusing 11/12/2017  8:43 AM        Opportunity for questions and clarification was provided.

## 2017-11-12 NOTE — Progress Notes (Signed)
discharged per MD verbal. IV removed; tip intact. Patient and daughter verbalized understanding of discharge instructions. Site is clean and intact. Transferred out via wheelchair with daughter to drive home.

## 2017-11-12 NOTE — Progress Notes (Signed)
IR prep complete.

## 2017-11-17 ENCOUNTER — Encounter: Attending: Community Health | Primary: Community Health

## 2017-11-20 ENCOUNTER — Other Ambulatory Visit: Payer: Self-pay

## 2017-11-20 ENCOUNTER — Encounter: Payer: Self-pay | Admitting: Family Medicine

## 2017-11-20 ENCOUNTER — Ambulatory Visit (INDEPENDENT_AMBULATORY_CARE_PROVIDER_SITE_OTHER): Payer: Medicare PPO | Admitting: Family Medicine

## 2017-11-20 VITALS — BP 124/72 | HR 58 | Temp 97.9°F | Resp 14 | Ht 67.0 in | Wt 182.0 lb

## 2017-11-20 DIAGNOSIS — Z9889 Other specified postprocedural states: Secondary | ICD-10-CM

## 2017-11-20 DIAGNOSIS — R4189 Other symptoms and signs involving cognitive functions and awareness: Secondary | ICD-10-CM | POA: Diagnosis not present

## 2017-11-20 NOTE — Patient Instructions (Addendum)
F/U 4 months  Take the aricept - Donezapril at bedtime Take calcium with vitamin D (1200mg / 1000)

## 2017-11-20 NOTE — Progress Notes (Signed)
   Subjective:    Patient ID: Molly Patel, female    DOB: [birth year], 72 y.o.   MRN: [id number]  Patient presents for Follow-up (is not fasting)  Pt here to f/u Memory, started on aricpt, she has missed some doses. Tries to write on calender  She has not had any difficulty paying her bills or driving.  Her family members have not said anything particular about her memory.  She has not had any side effects from the medication. No new concerns today.  She is still taking her Dilantin for seizure prophylaxis    Review Of Systems:  GEN- denies fatigue, fever, weight loss,weakness, recent illness HEENT- denies eye drainage, change in vision, nasal discharge, CVS- denies chest pain, palpitations RESP- denies SOB, cough, wheeze ABD- denies N/V, change in stools, abd pain GU- denies dysuria, hematuria, dribbling, incontinence MSK- denies joint pain, muscle aches, injury Neuro- denies headache, dizziness, syncope, seizure activity       Objective:    BP 124/72   Pulse (!) 58   Temp 97.9 F (36.6 C) (Oral)   Resp 14   Ht 5\' 7"  (1.702 m)   Wt 182 lb (82.6 kg)   SpO2 98%   BMI 28.51 kg/m  GEN- NAD, alert and oriented x3 HEENT- PERRL, EOMI, non injected sclera, pink conjunctiva, MMM, oropharynx clear CVS- RRR, no murmur RESP-CTAB Neuro- MMSE 30/30, CNII-XII in tact Pulses- Radial  2+        Assessment & Plan:      Problem List Items Addressed This Visit      Unprioritized   S/P craniotomy   Cognitive changes - Primary    Aricept at 5 mg.  Continue to encourage her to read crossword puzzles stay active within the community.  She still exercising regularly.  She did well on her Mini-Mental status exam today. The use of a calendar use of her phone to alert her to take her medications.  Also discussed that she can buy a pill caddy for reminders of when to take her medication.         Note: This dictation was prepared with Dragon dictation along with smaller phrase  technology. Any transcriptional errors that result from this process are unintentional.

## 2017-11-20 NOTE — Assessment & Plan Note (Addendum)
Aricept at 5 mg.  Continue to encourage her to read crossword puzzles stay active within the community.  She still exercising regularly.  She did well on her Mini-Mental status exam today. The use of a calendar use of her phone to alert her to take her medications.  Also discussed that she can buy a pill caddy for reminders of when to take her medication.

## 2017-11-26 ENCOUNTER — Other Ambulatory Visit: Payer: Self-pay | Admitting: Family Medicine

## 2017-11-26 ENCOUNTER — Ambulatory Visit: Admit: 2017-11-26 | Discharge: 2017-11-26 | Payer: MEDICARE | Attending: Urology | Primary: Community Health

## 2017-11-26 DIAGNOSIS — C679 Malignant neoplasm of bladder, unspecified: Secondary | ICD-10-CM

## 2017-11-26 NOTE — Progress Notes (Signed)
[facility]  [address] [place] [address]  [phone]    Cathy Jackson  DOB: 07-May-1946     HPI   72 y.o., female returns in follow up for CaB. S/P anterior pelvic exonteration, B PLND and ileal conduit urinary diversion on 10/13/12. Final path was TCC T1G3NO with neg margins. She has done well post op.  She returns after moving to [facility] for a yr.  Diagnosed with R ureteroenteric stricture and now has R nephroureteral stent placed by IR on 11/12/17.  Last CT on 02/09/17 showed a stable ventral hernia with a R distal ureteral stone.  [facility] which was neg for stones following CT.  Denies any recent infections or wt loss.  Cr was 0.93 on 10/29/17.          Past Medical History:   Diagnosis Date   ??? Abdominal pain, unspecified site 10/27/2012   ??? Arthritis     generalized   ??? Asthma 10/27/2012   ??? Bladder cancer ([place])    ??? Depression     managed with medication    ??? Dysuria 10/27/2012   ??? Environmental allergies     managed with medication    ??? GERD (gastroesophageal reflux disease) 10/27/2012   ??? History of kidney stones     states surgical removed   ??? Hypothyroidism     managed with medication    ??? Other "heavy-for-dates" infants     ureterectomy and ileal conduit   ??? Unspecified disorder of bladder 10/27/2012   ??? Urethral caruncle 10/27/2012   ??? Urinary frequency 10/27/2012   ??? Urinary tract infection, site not specified 10/27/2012     Past Surgical History:   Procedure Laterality Date   ??? CLOSE CYSTOSTOMY      cystoscopy/ turbt x 2   ??? HX BREAST BIOPSY Left 2000    x 2   ??? HX CESAREAN SECTION      x 1   ??? HX CHOLECYSTECTOMY     ??? HX NEPHROSTOMY  2014   ??? HX TUBAL LIGATION     ??? HX WRIST FRACTURE TX Left    ??? IR CONVERT NEPHRO PERC RT TO NEPHROURETERAL CATH EXISTING SI  11/12/2017   ??? IR EXCHANGE NEPHRO PERC RT SI  10/23/2017     Current Outpatient Medications   Medication Sig Dispense Refill   ??? divalproex DR (DEPAKOTE) 250 mg tablet Take 250 mg by mouth once over  twenty-four (24) hours.     ??? memantine ER (NAMENDA XR) 28 mg capsule Take 1 Cap by mouth daily. 2 tablets in the morning and 2 tablets in the evening. 30 Cap 0   ??? calcium carb-vitamin D3-vit K2 600 mg-1,000 unit-90 mcg tab Take  by mouth.     ??? cyanocobalamin 50 mcg tablet Take 50 mcg by mouth.     ??? famotidine (PEPCID) 20 mg tablet Take 20 mg by mouth.     ??? PARoxetine (PAXIL) 20 mg tablet Take 20 mg by mouth.     ??? senna-docusate (PERICOLACE) 8.6-50 mg per tablet Take  by mouth.     ??? calcium carbonate (OS-CAL) 500 mg calcium (1,250 mg) tablet Take  by mouth daily.     ??? ascorbic acid, vitamin C, (VITAMIN C) 1,000 mg tablet Take  by mouth.     ??? ferrous sulfate (IRON) 325 mg (65 mg iron) tablet Take  by mouth Daily (before breakfast).  No Known Allergies  Social History     Socioeconomic History   ??? Marital status: WIDOWED     Spouse name: Not on file   ??? Number of children: Not on file   ??? Years of education: Not on file   ??? Highest education level: Not on file   Occupational History   ??? Not on file   Social Needs   ??? Financial resource strain: Not on file   ??? Food insecurity:     Worry: Not on file     Inability: Not on file   ??? Transportation needs:     Medical: Not on file     Non-medical: Not on file   Tobacco Use   ??? Smoking status: Former Smoker     Packs/day: 0.50     Years: 20.00     Pack years: 10.00   ??? Smokeless tobacco: Never Used   ??? Tobacco comment: stopped at age 72   Substance and Sexual Activity   ??? Alcohol use: No   ??? Drug use: No   ??? Sexual activity: Never   Lifestyle   ??? Physical activity:     Days per week: Not on file     Minutes per session: Not on file   ??? Stress: Not on file   Relationships   ??? Social connections:     Talks on phone: Not on file     Gets together: Not on file     Attends religious service: Not on file     Active member of club or organization: Not on file     Attends meetings of clubs or organizations: Not on file     Relationship status: Not on file    ??? Intimate partner violence:     Fear of current or ex partner: Not on file     Emotionally abused: Not on file     Physically abused: Not on file     Forced sexual activity: Not on file   Other Topics Concern   ??? Not on file   Social History Narrative   ??? Not on file     Family History   Problem Relation Age of Onset   ??? Stroke Mother    ??? Heart Disease Mother    ??? Cancer Father    ??? Asthma Father    ??? Lung Disease Father         COPD/smoker   ??? Cancer Brother    ??? Heart Disease Brother    ??? Alcohol abuse Brother        Review of Systems  All systems reviewed and are negative at this time.    Physical Exam  Visit Vitals  BP 92/43   Pulse (!) 114   Temp 99.3 ??F (37.4 ??C) (Tympanic)   Wt 121 lb (54.9 kg)   BMI 22.86 kg/m??     General appearance - alert, well appearing, and in no distress  Mental status - alert, oriented to person, place, and time  Eyes - extraocular eye movements intact, sclera anicteric  Abdomen - soft, nontender, nondistended, no masses or organomegaly.  Reducible ventral hernia.  Urostomy pink.  Stent protruding from urostomy into bag.  Neurological -  normal speech, no focal findings or movement disorder noted  Skin - normal coloration and turgor      Assessment/Plan    ICD-10-CM ICD-9-CM    1. Malignant neoplasm of urinary bladder, unspecified site (HCC) C67.9 188.9 CANCELED: AMB POC  URINALYSIS DIP STICK AUTO W/O MICRO (PGU)   2. Ureteral stricture, right N13.5 593.3    3. Incisional hernia, without obstruction or gangrene K43.2 553.21      She will cont q2-3 mo ureteral stent exchanges in IR.  RTO in 6 mo to reassess.    Dione Booze Larnell Granlund, DO

## 2017-11-27 ENCOUNTER — Encounter: Payer: Self-pay | Admitting: Podiatry

## 2017-11-27 ENCOUNTER — Ambulatory Visit: Payer: Medicare PPO | Admitting: Podiatry

## 2017-11-27 ENCOUNTER — Ambulatory Visit: Admit: 2017-11-27 | Discharge: 2017-11-27 | Payer: MEDICARE | Attending: Community Health | Primary: Community Health

## 2017-11-27 VITALS — BP 160/83 | HR 56 | Resp 16

## 2017-11-27 DIAGNOSIS — C679 Malignant neoplasm of bladder, unspecified: Secondary | ICD-10-CM

## 2017-11-27 DIAGNOSIS — M2041 Other hammer toe(s) (acquired), right foot: Secondary | ICD-10-CM

## 2017-11-27 DIAGNOSIS — Q828 Other specified congenital malformations of skin: Secondary | ICD-10-CM

## 2017-11-27 NOTE — Assessment & Plan Note (Signed)
This condition is managed by Specialist.  Key Oncology Meds     Patient is on no Oncologic meds.        Key Pain Meds     The patient is on no pain meds.        Lab Results   Component Value Date/Time    WBC 9.0 10/29/2017 11:25 PM    ABS. NEUTROPHILS 5.8 10/29/2017 11:25 PM    HGB 14.0 10/29/2017 11:25 PM    HCT 44.5 10/29/2017 11:25 PM    PLATELET 218 10/29/2017 11:25 PM    Creatinine 0.93 10/29/2017 11:25 PM    BUN 15 10/29/2017 11:25 PM    ALT (SGPT) 16 10/29/2017 11:25 PM    AST (SGOT) 30 10/29/2017 11:25 PM    Albumin 3.2 10/29/2017 11:25 PM

## 2017-11-27 NOTE — Progress Notes (Signed)
H & P      Reason for Visit:    Chief Complaint   Patient presents with   ??? Medication Refill     Pt needs refills on her medications but her daughter states she is unsure which medications. They did not bring them today and she wants to call back later to advise.    ??? Other     Pt wants to get something for allergies, she states she is having runny eye/nose.    ??? Shaking     Pt is having issues with her head shaking due to what she believes is nervousness. Her daughter thinks her antidepressant needs to be upped.        History of Present Illness:  Cathy Jackson is a 72 y.o. female who presents today for follow up. She is accompanied by her daughter and lives with her. She was recently seen by Dr. Myrna Blazer with Urology and has her ureteral stent exchanges scheduled with IR. She is also being evaluated and treated by Geriatric Medicine ([place]). She was started on Depakote for dementia and they have a follow up appt next week to discuss possible medication adjustments.Daughter is also working with the [facility] as well. Daughter is requesting medication refills however she did not bring medications with her and is unsure of what medications need refills. She will call the office on Monday and let us know. Patient is asking for allergy medication recommendations. I advised she can use allegra or zyrtec otc.     Review of Systems:  Review of Systems   Constitutional: Negative for chills, fever and malaise/fatigue.   HENT: Negative for congestion, ear pain, sinus pain and sore throat.    Eyes: Negative for pain.   Respiratory: Negative for cough, sputum production, shortness of breath and wheezing.    Cardiovascular: Negative for chest pain and palpitations.   Gastrointestinal: Negative for abdominal pain, blood in stool, constipation, diarrhea, nausea and vomiting.   Genitourinary: Negative for dysuria, frequency and urgency.   Musculoskeletal: Negative for myalgias.    Neurological: Negative for dizziness, weakness and headaches.   Psychiatric/Behavioral: Negative for depression.             No Known Allergies  Past Medical History:   Diagnosis Date   ??? Abdominal pain, unspecified site 10/27/2012   ??? Arthritis     generalized   ??? Asthma 10/27/2012   ??? Bladder cancer ([place])    ??? Depression     managed with medication    ??? Dysuria 10/27/2012   ??? Environmental allergies     managed with medication    ??? GERD (gastroesophageal reflux disease) 10/27/2012   ??? History of kidney stones     states surgical removed   ??? Hypothyroidism     managed with medication    ??? Other "heavy-for-dates" infants     ureterectomy and ileal conduit   ??? Unspecified disorder of bladder 10/27/2012   ??? Urethral caruncle 10/27/2012   ??? Urinary frequency 10/27/2012   ??? Urinary tract infection, site not specified 10/27/2012     Past Surgical History:   Procedure Laterality Date   ??? CLOSE CYSTOSTOMY      cystoscopy/ turbt x 2   ??? HX BREAST BIOPSY Left 2000    x 2   ??? HX CESAREAN SECTION      x 1   ??? HX CHOLECYSTECTOMY     ??? HX NEPHROSTOMY  2014   ??? HX TUBAL LIGATION     ???  HX WRIST FRACTURE TX Left    ??? IR CONVERT NEPHRO PERC RT TO NEPHROURETERAL CATH EXISTING SI  11/12/2017   ??? IR EXCHANGE NEPHRO PERC RT SI  10/23/2017     Family History   Problem Relation Age of Onset   ??? Stroke Mother    ??? Heart Disease Mother    ??? Cancer Father    ??? Asthma Father    ??? Lung Disease Father         COPD/smoker   ??? Cancer Brother    ??? Heart Disease Brother    ??? Alcohol abuse Brother      Social History     Socioeconomic History   ??? Marital status: WIDOWED     Spouse name: Not on file   ??? Number of children: Not on file   ??? Years of education: Not on file   ??? Highest education level: Not on file   Occupational History   ??? Not on file   Social Needs   ??? Financial resource strain: Not on file   ??? Food insecurity:     Worry: Not on file     Inability: Not on file   ??? Transportation needs:     Medical: Not on file     Non-medical: Not on file   Tobacco Use    ??? Smoking status: Former Smoker     Packs/day: 0.50     Years: 20.00     Pack years: 10.00   ??? Smokeless tobacco: Never Used   ??? Tobacco comment: stopped at age 67   Substance and Sexual Activity   ??? Alcohol use: No   ??? Drug use: No   ??? Sexual activity: Never   Lifestyle   ??? Physical activity:     Days per week: Not on file     Minutes per session: Not on file   ??? Stress: Not on file   Relationships   ??? Social connections:     Talks on phone: Not on file     Gets together: Not on file     Attends religious service: Not on file     Active member of club or organization: Not on file     Attends meetings of clubs or organizations: Not on file     Relationship status: Not on file   ??? Intimate partner violence:     Fear of current or ex partner: Not on file     Emotionally abused: Not on file     Physically abused: Not on file     Forced sexual activity: Not on file   Other Topics Concern   ??? Not on file   Social History Narrative   ??? Not on file     Current Outpatient Medications   Medication Sig Dispense Refill   ??? divalproex DR (DEPAKOTE) 250 mg tablet Take 250 mg by mouth once over twenty-four (24) hours.     ??? memantine ER (NAMENDA XR) 28 mg capsule Take 1 Cap by mouth daily. 2 tablets in the morning and 2 tablets in the evening. 30 Cap 0   ??? calcium carb-vitamin D3-vit K2 600 mg-1,000 unit-90 mcg tab Take  by mouth.     ??? cyanocobalamin 50 mcg tablet Take 50 mcg by mouth.     ??? famotidine (PEPCID) 20 mg tablet Take 20 mg by mouth.     ??? PARoxetine (PAXIL) 20 mg tablet Take 20 mg by mouth.     ???  senna-docusate (PERICOLACE) 8.6-50 mg per tablet Take  by mouth.     ??? calcium carbonate (OS-CAL) 500 mg calcium (1,250 mg) tablet Take  by mouth daily.     ??? ascorbic acid, vitamin C, (VITAMIN C) 1,000 mg tablet Take  by mouth.     ??? ferrous sulfate (IRON) 325 mg (65 mg iron) tablet Take  by mouth Daily (before breakfast).         3 most recent PHQ Screens 08/29/2015    Little interest or pleasure in doing things Several days   Feeling down, depressed, irritable, or hopeless Several days   Total Score PHQ 2 2     Fall Risk Assessment, last 12 mths 10/20/2017   Able to walk? Yes   Fall in past 12 months? No     OBJECTIVE:  Visit Vitals  BP 118/62 (BP 1 Location: Left arm, BP Patient Position: Sitting)   Pulse 91   Resp 18   Ht 5\' 1"  (1.549 m)   Wt 126 lb 6.4 oz (57.3 kg)   SpO2 94%   BMI 23.88 kg/m??       Physical Exam:  Physical Exam   Constitutional: She is oriented to person, place, and time. She appears well-developed.   Eyes: Pupils are equal, round, and reactive to light.   Cardiovascular: Normal rate.   Pulmonary/Chest: Effort normal and breath sounds normal.   Abdominal: Soft. Bowel sounds are normal.   Neurological: She is alert and oriented to person, place, and time.   Skin: Skin is warm and dry.   Vitals reviewed.    ASSESSMENT:  Diagnoses and all orders for this visit:    1. Malignant neoplasm of urinary bladder, unspecified site [facility])  Assessment & Plan:  This condition is managed by Specialist.  Key Oncology Meds     Patient is on no Oncologic meds.        Key Pain Meds     The patient is on no pain meds.        Lab Results   Component Value Date/Time    WBC 9.0 10/29/2017 11:25 PM    ABS. NEUTROPHILS 5.8 10/29/2017 11:25 PM    HGB 14.0 10/29/2017 11:25 PM    HCT 44.5 10/29/2017 11:25 PM    PLATELET 218 10/29/2017 11:25 PM    Creatinine 0.93 10/29/2017 11:25 PM    BUN 15 10/29/2017 11:25 PM    ALT (SGPT) 16 10/29/2017 11:25 PM    AST (SGOT) 30 10/29/2017 11:25 PM    Albumin 3.2 10/29/2017 11:25 PM         2. Encounter for follow-up in outpatient clinic        PLAN:  Daughter will call me with medication list and what she is needing refills on. All questions were asked and answered to the best of my ability.  The patient verbalized understanding and agrees with the plan above.             Rae Roam [facility]  [place]  [address] [place] [address]  Office : [phone]  Fax : [phone]

## 2017-11-30 NOTE — Progress Notes (Signed)
Subjective:   Patient ID: Molly Patel, female   DOB: 72 y.o.   MRN: [id number]   HPI 72 year old female presents the office with concerns of a corn to the right fifth toe which is been ongoing for "a long time".  She states the area is painful with pressure in shoes that should have been gone.  She denies any recent injury she denies swelling redness or any drainage.  She has no other concerns today.   Review of Systems  All other systems reviewed and are negative.  Past Medical History:  Diagnosis Date  . Angioedema   . Depression   . Dizziness   . Echocardiogram with ECG monitoring july 2012   mild LVH, normal event monitor   . Hyperlipidemia   . Hypertension    takes meds daily  . Incontinence   . Insomnia   . Ovarian cyst   . Seizures ([place])    2 weeks ago  . Stroke ([place]) 2016  . Syncope   . Vitamin D deficiency     Past Surgical History:  Procedure Laterality Date  . ABDOMINAL HYSTERECTOMY    . COLONOSCOPY N/A 11/02/2014   Procedure: COLONOSCOPY;  Surgeon: Rogene Houston, MD;  Location[facility];  Service: Endoscopy;  Laterality: N/A;  140 - moved to 12:55 - Ann to notify pt  . CRANIOTOMY  06/29/2012   Procedure: CRANIOTOMY TUMOR EXCISION;  Surgeon: Faythe Ghee, MD;  Location: [place] [facility];  Service: Neurosurgery;  Laterality: Left;  Left frontal Craniotomy for Meningioma  . ESOPHAGEAL DILATION N/A 11/02/2014   Procedure: ESOPHAGEAL DILATION;  Surgeon: Rogene Houston, MD;  Location: [facility];  Service: Endoscopy;  Laterality: N/A;  . ESOPHAGOGASTRODUODENOSCOPY N/A 11/02/2014   Procedure: ESOPHAGOGASTRODUODENOSCOPY (EGD);  Surgeon: Rogene Houston, MD;  Location: [facility];  Service: Endoscopy;  Laterality: N/A;  . VESICOVAGINAL FISTULA CLOSURE W/ TAH       Current Outpatient Medications:  .  aspirin EC 325 MG tablet, Take 1 tablet (325 mg total) by mouth daily., Disp: 30 tablet, Rfl: 0 .  atenolol (TENORMIN) 50 MG tablet, TAKE 1 TABLET EVERY  DAY, Disp: 90 tablet, Rfl: 2 .  atorvastatin (LIPITOR) 10 MG tablet, TAKE 1 TABLET EVERY DAY, Disp: 90 tablet, Rfl: 3 .  cholecalciferol (VITAMIN D) 1000 units tablet, TAKE 1 TABLET EVERY DAY, Disp: 90 tablet, Rfl: 3 .  clonazePAM (KLONOPIN) 0.5 MG tablet, Take 0.5 mg by mouth 3 (three) times daily as needed., Disp: , Rfl: 3 .  donepezil (ARICEPT) 5 MG tablet, Take 1 tablet (5 mg total) by mouth at bedtime., Disp: 90 tablet, Rfl: 1 .  furosemide (LASIX) 40 MG tablet, TAKE 1 TABLET EVERY DAY AS NEEDED, Disp: 90 tablet, Rfl: 2 .  hydrochlorothiazide (HYDRODIURIL) 25 MG tablet, TAKE 1/2 TABLET EVERY DAY, Disp: 45 tablet, Rfl: 1 .  phenytoin (DILANTIN) 100 MG ER capsule, TAKE 1 CAPSULE THREE TIMES DAILY, Disp: 270 capsule, Rfl: 1 .  Vitamin D, Cholecalciferol, 1000 units CAPS, Take 1 capsule by mouth daily., Disp: 90 capsule, Rfl: 3  No Known Allergies  Social History   Socioeconomic History  . Marital status: Divorced    Spouse name: Not on file  . Number of children: 2  . Years of education: Not on file  . Highest education level: Not on file  Occupational History  . Occupation: Retired    Comment: Tree surgeon  Social Needs  . Financial resource strain: Not on  file  . Food insecurity:    Worry: Not on file    Inability: Not on file  . Transportation needs:    Medical: Not on file    Non-medical: Not on file  Tobacco Use  . Smoking status: Former Smoker    Years: 7.00    Types: Cigarettes    Last attempt to quit: 08/25/1985    Years since quitting: 32.2  . Smokeless tobacco: Never Used  Substance and Sexual Activity  . Alcohol use: No  . Drug use: No  . Sexual activity: Not on file  Lifestyle  . Physical activity:    Days per week: Not on file    Minutes per session: Not on file  . Stress: Not on file  Relationships  . Social connections:    Talks on phone: Not on file    Gets together: Not on file    Attends religious service: Not on file    Active member of club  or organization: Not on file    Attends meetings of clubs or organizations: Not on file    Relationship status: Not on file  . Intimate partner violence:    Fear of current or ex partner: Not on file    Emotionally abused: Not on file    Physically abused: Not on file    Forced sexual activity: Not on file  Other Topics Concern  . Not on file  Social History Narrative  . Not on file       Objective:  Physical Exam  General: AAO x3, NAD  Dermatological: Hyperkeratotic lesion to the dorsal lateral aspect of the right fifth PIPJ.  Upon debridement there is no underlying ulceration, drainage or any signs of infection but there was a deep punctate lesion present.  Vascular: Dorsalis Pedis artery and Posterior Tibial artery pedal pulses are 2/4 bilateral with immedate capillary fill time.  There is no pain with calf compression, swelling, warmth, erythema.   Neruologic: Grossly intact via light touch bilateral. Protective threshold with Semmes Wienstein monofilament intact to all pedal sites bilateral.   Musculoskeletal: Adductovarus is present of the fifth digits.  Muscular strength 5/5 in all groups tested bilateral.  Gait: Unassisted, Nonantalgic.       Assessment:   Hyperkeratotic lesion to the underlying digital deformity    Plan:  -Treatment options discussed including all alternatives, risks, and complications -Etiology of symptoms were discussed -We discussed both conservative as well as surgical treatment options.  Discussed going to try to get rid of the cord more pronounced in the surgical shoes to work on this.  I sharply debrided the callus there that any complications of bleeding and after debridement there was resolution of pain.  Dispensed offloading pads also discussed shoe modifications.  Monitor for any signs or symptoms of infection.  Trula Slade DPM

## 2017-12-03 NOTE — Telephone Encounter (Signed)
Donepezil 10 mg - memantine ER 28 mg - Paroxetine 20 mg - divalproex DR 250 mg - [facility] on file

## 2017-12-03 NOTE — Telephone Encounter (Signed)
Spoke to [place] ([facility]) in regards to medication refill. She has appointment with [facility] tomorrow. They will address medication refill and changes if necessary. Advised her to make sure to bring all medication bottles during next office visit.

## 2017-12-11 DIAGNOSIS — G245 Blepharospasm: Secondary | ICD-10-CM | POA: Diagnosis not present

## 2017-12-11 DIAGNOSIS — H2513 Age-related nuclear cataract, bilateral: Secondary | ICD-10-CM | POA: Diagnosis not present

## 2017-12-11 DIAGNOSIS — H02423 Myogenic ptosis of bilateral eyelids: Secondary | ICD-10-CM | POA: Diagnosis not present

## 2017-12-11 DIAGNOSIS — H40013 Open angle with borderline findings, low risk, bilateral: Secondary | ICD-10-CM | POA: Diagnosis not present

## 2017-12-11 DIAGNOSIS — H02831 Dermatochalasis of right upper eyelid: Secondary | ICD-10-CM | POA: Diagnosis not present

## 2018-01-19 ENCOUNTER — Ambulatory Visit: Payer: MEDICARE | Primary: Community Health

## 2018-01-29 ENCOUNTER — Other Ambulatory Visit: Payer: Self-pay | Admitting: Family Medicine

## 2018-02-15 ENCOUNTER — Encounter

## 2018-02-15 ENCOUNTER — Inpatient Hospital Stay: Admit: 2018-02-15 | Payer: Self-pay | Attending: Diagnostic Radiology | Primary: Community Health

## 2018-02-15 DIAGNOSIS — N135 Crossing vessel and stricture of ureter without hydronephrosis: Secondary | ICD-10-CM

## 2018-02-15 MED ORDER — IOPAMIDOL 61 % IV SOLN
300 mg iodine /mL (61 %) | Freq: Once | INTRAVENOUS | Status: AC
Start: 2018-02-15 — End: 2018-02-15
  Administered 2018-02-15: 17:00:00 via INTRAVENOUS

## 2018-02-15 MED ORDER — LIDOCAINE HCL 2 % (20 MG/ML) IJ SOLN
20 mg/mL (2 %) | Freq: Once | INTRAMUSCULAR | Status: DC
Start: 2018-02-15 — End: 2018-02-16

## 2018-02-15 NOTE — Progress Notes (Signed)
.  Patient to IR room for procedure. Patient awake and alert, verbalized name, DOB, and procedure to be performed. Patient moved to procedure table independently.

## 2018-02-15 NOTE — Progress Notes (Signed)
Recovery period without difficulty. Pt alert and oriented and denies pain. Dressing is clean, dry, and intact. Reviewed discharge instructions with patient and daughter Earnest Bailey, both verbalized understanding. Pt escorted to lobby discharge area via wheelchair. Vital signs and [place] score completed.

## 2018-03-01 ENCOUNTER — Ambulatory Visit: Admit: 2018-03-01 | Discharge: 2018-03-01 | Payer: MEDICARE | Attending: Community Health | Primary: Community Health

## 2018-03-01 DIAGNOSIS — I10 Essential (primary) hypertension: Secondary | ICD-10-CM

## 2018-03-01 NOTE — Progress Notes (Signed)
[place] [address]  [phone]    [name], DOB: [birth year], AGE: 72 y.o.  Chief Complaint   Patient presents with   ??? Follow Up Chronic Condition     Pt is here this morning for her CC follow up, she is fasting.    ??? Hypertension   ??? Cholesterol Problem   ??? Hypothyroidism   ??? Osteoarthritis   ??? GERD   ??? Depression   ??? Dementia   ??? Medication Refill     donezepril, simvastatin, famotidine, paroxetine     Visit Vitals  BP 90/58 (BP 1 Location: Left arm, BP Patient Position: Sitting)   Pulse 87   Resp 18   Ht 5\' 2"  (1.575 m)   Wt 121 lb 6.4 oz (55.1 kg)   SpO2 96%   BMI 22.20 kg/m??     Social History     Tobacco Use   ??? Smoking status: Former Smoker     Packs/day: 0.50     Years: 20.00     Pack years: 10.00   ??? Smokeless tobacco: Never Used   ??? Tobacco comment: stopped at age 25   Substance Use Topics   ??? Alcohol use: No   ??? Drug use: No     Current Outpatient Medications   Medication Sig Dispense Refill   ??? simvastatin (ZOCOR) 20 mg tablet Take 1 Tab by mouth.     ??? famotidine (PEPCID) 20 mg tablet Take 1 Tab by mouth.     ??? donepezil (ARICEPT) 10 mg tablet Take 1 Tab by mouth.     ??? divalproex DR (DEPAKOTE) 250 mg tablet Take 250 mg by mouth once over twenty-four (24) hours.     ??? calcium carb-vitamin D3-vit K2 600 mg-1,000 unit-90 mcg tab Take  by mouth.     ??? cyanocobalamin 50 mcg tablet Take 50 mcg by mouth.     ??? famotidine (PEPCID) 20 mg tablet Take 20 mg by mouth.     ??? PARoxetine (PAXIL) 20 mg tablet Take 20 mg by mouth.     ??? senna-docusate (PERICOLACE) 8.6-50 mg per tablet Take  by mouth.     ??? calcium carbonate (OS-CAL) 500 mg calcium (1,250 mg) tablet Take  by mouth daily.     ??? ascorbic acid, vitamin C, (VITAMIN C) 1,000 mg tablet Take  by mouth.     ??? ferrous sulfate (IRON) 325 mg (65 mg iron) tablet Take  by mouth Daily (before breakfast).       No Known Allergies   Health Maintenance Topics with due status: Overdue       Topic Date Due    Hepatitis C Screening 08-03-1946    DTaP/Tdap/Td series 11/24/1964    Shingrix Vaccine Age 48> 11/24/1993    FOBT Q 1 YEAR AGE 71-75 11/24/1993    GLAUCOMA SCREENING Q2Y 11/24/2008    Bone Densitometry (Dexa) Screening 11/24/2008    Pneumococcal 65+ years 11/24/2008    MEDICARE YEARLY EXAM 11/11/2016    BREAST CANCER SCRN MAMMOGRAM 01/03/2017       HPI   History of Present Illness  [name] presents today for repeat labs accompanied by her daughter. She is managed for dementia by geriatric medicine. She recently left her daughters house abruptly and went to stay with her other daughter in [place]. She has since then returned. Bladder cancer managed by Dr. Myrna Blazer  with Urology and she has her ureteral stent exchanges scheduled with IR. No other complaints verbalized today. She denies sob, chest pain, palpations or fever. VSS.    Follow Up Chronic Condition   Pertinent negatives include no chest pain, no abdominal pain, no headaches and no shortness of breath.   Hypertension    The history is provided by the patient and police. This is a chronic problem. The problem has been resolved. Pertinent negatives include no chest pain, no palpitations, no malaise/fatigue, no headaches, no dizziness, no shortness of breath, no nausea and no vomiting.   Cholesterol Problem   The history is provided by the patient and relative. This is a chronic problem. Pertinent negatives include no chest pain, no abdominal pain, no headaches and no shortness of breath. The symptoms are relieved by medications.   GERD   The history is provided by the patient and relative. This is a chronic problem. The problem occurs every several days. The problem has not changed since onset.Pertinent negatives include no chest pain, no abdominal pain, no headaches and no shortness of breath. The symptoms are aggravated by eating. The symptoms are relieved by medications.    Depression   The history is provided by the patient and relative. The current episode started more than 1 week ago. The problem has not changed since onset.Pertinent negatives include no chest pain, no abdominal pain, no headaches and no shortness of breath.   Dementia    The history is provided by the patient and relative. This is a chronic problem. The current episode started more than 1 week ago. The problem has been gradually worsening. Associated symptoms include agitation. Pertinent negatives include no weakness. Mental status baseline is moderate dementia.  Risk factors include dementia and the patient not taking meds correctly. Her past medical history is significant for hypertension.   Thyroid Problem   The history is provided by the patient. This is a chronic problem. The current episode started more than 1 week ago. The problem has been rapidly improving. Pertinent negatives include no chest pain, no abdominal pain, no headaches and no shortness of breath.       Reviewed pertinent past medical history, surgical history, social history, and family history as noted in patient record.     ROS  Review of Systems   Constitutional: Negative for chills, fever and malaise/fatigue.   HENT: Negative for congestion, ear pain, sinus pain and sore throat.    Eyes: Negative for pain.   Respiratory: Negative for cough, sputum production, shortness of breath and wheezing.    Cardiovascular: Negative for chest pain and palpitations.   Gastrointestinal: Negative for abdominal pain, blood in stool, constipation, diarrhea, nausea and vomiting.   Genitourinary: Negative for dysuria, frequency and urgency.   Musculoskeletal: Negative for myalgias.   Neurological: Negative for dizziness, weakness and headaches.   Psychiatric/Behavioral: Positive for agitation and depression.       Physical Exam   Constitutional: She is oriented to person, place, and time and well-developed, well-nourished, and in no distress.   HENT:    Head: Normocephalic and atraumatic.   Right Ear: External ear normal.   Left Ear: External ear normal.   Nose: Nose normal.   Mouth/Throat: Oropharynx is clear and moist.   Eyes: Conjunctivae are normal.   Neck: Neck supple. No thyromegaly present.   Cardiovascular: Normal rate, regular rhythm and normal heart sounds.   No murmur heard.  Pulmonary/Chest: Effort normal and breath sounds normal. No respiratory distress.  She has no wheezes. She has no rales.   Musculoskeletal: She exhibits no edema.   Lymphadenopathy:     She has no cervical adenopathy.   Neurological: She is alert and oriented to person, place, and time.   Skin: Skin is warm and dry. No rash noted.   Psychiatric: Mood and memory normal.   Nursing note and vitals reviewed.    Lab Results  Fasting labs today  Assessment & Plan  Isabelle Matt presents for a chronic care visit. Pre-visit preparations were complete prior to appointment. Barriers to treatment goals include: concurrent mental health issues and medication non-compliance  Self-management abilities include: She has her daughter as HPOA and helping manage her social and medical needs. Patient trying to get involved in [facility] program.     Diagnoses and all orders for this visit:    1. Essential hypertension  Assessment & Plan:  Well Controlled, based on history, physical exam and review of pertinent labs, studies and medications; meds reconciled; continue current treatment plan, lifestyle modifications recommended.  Key CAD CHF Meds             simvastatin (ZOCOR) 20 mg tablet (Taking) Take 1 Tab by mouth.        Lab Results   Component Value Date/Time    Sodium 139 10/29/2017 11:25 PM    Potassium 5.3 10/29/2017 11:25 PM    Cholesterol, total 177 10/20/2017 10:54 AM    HDL Cholesterol 58 10/20/2017 10:54 AM    LDL, calculated 100 10/20/2017 10:54 AM    Triglyceride 95 10/20/2017 10:54 AM       Orders:  -     METABOLIC PANEL, COMPREHENSIVE    2. Mixed hyperlipidemia  Assessment & Plan:   Well Controlled, based on history, physical exam and review of pertinent labs, studies and medications; meds reconciled; continue current treatment plan, medication compliance emphasized.  Key Antihyperlipidemia Meds             simvastatin (ZOCOR) 20 mg tablet (Taking) Take 1 Tab by mouth.        Lab Results   Component Value Date/Time    Cholesterol, total 177 10/20/2017 10:54 AM    HDL Cholesterol 58 10/20/2017 10:54 AM    LDL, calculated 100 (H) 10/20/2017 10:54 AM    Triglyceride 95 10/20/2017 10:54 AM       Orders:  -     LIPID PANEL    3. Acquired hypothyroidism  Assessment & Plan:  Stable, based on history, physical exam and review of pertinent labs, studies and medications; meds reconciled; continue current treatment plan, lifestyle modifications recommended.    Lab Results   Component Value Date/Time    TSH 1.160 10/20/2017 10:54 AM    T4, Free 1.10 01/08/2016 04:16 PM        Orders:  -     TSH 3RD GENERATION    4. Iron deficiency anemia, unspecified iron deficiency anemia type  -     CBC WITH AUTOMATED DIFF    5. GERD without esophagitis  Assessment & Plan:  Well Controlled, based on history, physical exam and review of pertinent labs, studies and medications; meds reconciled; continue current treatment plan, medication compliance emphasized.          6. Frontotemporal dementia  Assessment & Plan:  This condition is managed by Specialist.          7. Depression with anxiety  Assessment & Plan:  Stable, based on history, physical exam and  review of pertinent labs, studies and medications; meds reconciled; continue current treatment plan, medication compliance emphasized.  Key Psychotherapeutic Meds             donepezil (ARICEPT) 10 mg tablet (Taking) Take 1 Tab by mouth.    PARoxetine (PAXIL) 20 mg tablet (Taking) Take 20 mg by mouth.                Patient and/or caretaker counseled on the importance of balanced diet, exercise, weight management, and medication adherence. Addressed any  identified treatment barriers. Care plans, self-management plans, and any necessary patient education handouts and self-management tools provided.    Follow-up and Dispositions    ?? Return in about 3 months (around 06/01/2018).           Wilfred Lacy, NP

## 2018-03-01 NOTE — Assessment & Plan Note (Signed)
This condition is managed by Specialist.

## 2018-03-01 NOTE — Assessment & Plan Note (Signed)
Stable, based on history, physical exam and review of pertinent labs, studies and medications; meds reconciled; continue current treatment plan, medication compliance emphasized.  Key Psychotherapeutic Meds             donepezil (ARICEPT) 10 mg tablet (Taking) Take 1 Tab by mouth.    PARoxetine (PAXIL) 20 mg tablet (Taking) Take 20 mg by mouth.

## 2018-03-01 NOTE — Assessment & Plan Note (Signed)
Well Controlled, based on history, physical exam and review of pertinent labs, studies and medications; meds reconciled; continue current treatment plan, lifestyle modifications recommended.  Key CAD CHF Meds             simvastatin (ZOCOR) 20 mg tablet (Taking) Take 1 Tab by mouth.        Lab Results   Component Value Date/Time    Sodium 139 10/29/2017 11:25 PM    Potassium 5.3 10/29/2017 11:25 PM    Cholesterol, total 177 10/20/2017 10:54 AM    HDL Cholesterol 58 10/20/2017 10:54 AM    LDL, calculated 100 10/20/2017 10:54 AM    Triglyceride 95 10/20/2017 10:54 AM

## 2018-03-01 NOTE — Assessment & Plan Note (Signed)
Stable, based on history, physical exam and review of pertinent labs, studies and medications; meds reconciled; continue current treatment plan, lifestyle modifications recommended.    Lab Results   Component Value Date/Time    TSH 1.160 10/20/2017 10:54 AM    T4, Free 1.10 01/08/2016 04:16 PM

## 2018-03-01 NOTE — Assessment & Plan Note (Signed)
Well Controlled, based on history, physical exam and review of pertinent labs, studies and medications; meds reconciled; continue current treatment plan, medication compliance emphasized.

## 2018-03-01 NOTE — Progress Notes (Signed)
Please let Cathy Jackson know that her cbc was normal, kidney and liver function are good. Cholesterol panel is fantastic and thyroid is normal.

## 2018-03-01 NOTE — Progress Notes (Signed)
I have mailed a copy of the results/recommendations to the pt.

## 2018-03-01 NOTE — Assessment & Plan Note (Signed)
Well Controlled, based on history, physical exam and review of pertinent labs, studies and medications; meds reconciled; continue current treatment plan, medication compliance emphasized.  Key Antihyperlipidemia Meds             simvastatin (ZOCOR) 20 mg tablet (Taking) Take 1 Tab by mouth.        Lab Results   Component Value Date/Time    Cholesterol, total 177 10/20/2017 10:54 AM    HDL Cholesterol 58 10/20/2017 10:54 AM    LDL, calculated 100 (H) 10/20/2017 10:54 AM    Triglyceride 95 10/20/2017 10:54 AM

## 2018-03-02 LAB — CBC WITH AUTOMATED DIFF
ABS. BASOPHILS: 0 10*3/uL (ref 0.0–0.2)
ABS. EOSINOPHILS: 0.2 10*3/uL (ref 0.0–0.4)
ABS. IMM. GRANS.: 0 10*3/uL (ref 0.0–0.1)
ABS. MONOCYTES: 0.7 10*3/uL (ref 0.1–0.9)
ABS. NEUTROPHILS: 6.1 10*3/uL (ref 1.4–7.0)
Abs Lymphocytes: 1.9 10*3/uL (ref 0.7–3.1)
BASOPHILS: 0 %
EOSINOPHILS: 3 %
HCT: 44.7 % (ref 34.0–46.6)
HGB: 14.1 g/dL (ref 11.1–15.9)
IMMATURE GRANULOCYTES: 0 %
Lymphocytes: 21 %
MCH: 30.2 pg (ref 26.6–33.0)
MCHC: 31.5 g/dL (ref 31.5–35.7)
MCV: 96 fL (ref 79–97)
MONOCYTES: 8 %
NEUTROPHILS: 68 %
PLATELET: 282 10*3/uL (ref 150–450)
RBC: 4.67 x10E6/uL (ref 3.77–5.28)
RDW: 13.9 % (ref 12.3–15.4)
WBC: 8.9 10*3/uL (ref 3.4–10.8)

## 2018-03-02 LAB — METABOLIC PANEL, COMPREHENSIVE
A-G Ratio: 1.5 (ref 1.2–2.2)
ALT (SGPT): 7 IU/L (ref 0–32)
AST (SGOT): 10 IU/L (ref 0–40)
Albumin: 4.1 g/dL (ref 3.5–4.8)
Alk. phosphatase: 75 IU/L (ref 39–117)
BUN/Creatinine ratio: 12 (ref 12–28)
BUN: 11 mg/dL (ref 8–27)
Bilirubin, total: 0.3 mg/dL (ref 0.0–1.2)
CO2: 23 mmol/L (ref 20–29)
Calcium: 9.2 mg/dL (ref 8.7–10.3)
Chloride: 106 mmol/L (ref 96–106)
Creatinine: 0.93 mg/dL (ref 0.57–1.00)
GFR est AA: 70 mL/min/{1.73_m2} (ref >59)
GFR est non-AA: 61 mL/min/{1.73_m2} (ref >59)
GLOBULIN, TOTAL: 2.7 g/dL (ref 1.5–4.5)
Glucose: 102 mg/dL — ABNORMAL HIGH (ref 65–99)
Potassium: 4.9 mmol/L (ref 3.5–5.2)
Protein, total: 6.8 g/dL (ref 6.0–8.5)
Sodium: 142 mmol/L (ref 134–144)

## 2018-03-02 LAB — LIPID PANEL
Cholesterol, total: 146 mg/dL (ref 100–199)
HDL Cholesterol: 51 mg/dL (ref >39)
LDL, calculated: 67 mg/dL (ref 0–99)
Triglyceride: 138 mg/dL (ref 0–149)
VLDL, calculated: 28 mg/dL (ref 5–40)

## 2018-03-02 LAB — TSH 3RD GENERATION: TSH: 1.81 u[IU]/mL (ref 0.450–4.500)

## 2018-03-22 ENCOUNTER — Other Ambulatory Visit: Payer: Self-pay

## 2018-03-22 ENCOUNTER — Encounter: Payer: Self-pay | Admitting: Family Medicine

## 2018-03-22 ENCOUNTER — Ambulatory Visit: Payer: Medicare PPO | Admitting: Family Medicine

## 2018-03-22 VITALS — BP 152/80 | HR 60 | Temp 98.5°F | Resp 12 | Ht 67.0 in | Wt 177.0 lb

## 2018-03-22 DIAGNOSIS — I10 Essential (primary) hypertension: Secondary | ICD-10-CM | POA: Diagnosis not present

## 2018-03-22 DIAGNOSIS — E7800 Pure hypercholesterolemia, unspecified: Secondary | ICD-10-CM | POA: Diagnosis not present

## 2018-03-22 DIAGNOSIS — Z9889 Other specified postprocedural states: Secondary | ICD-10-CM

## 2018-03-22 LAB — COMPREHENSIVE METABOLIC PANEL
AG RATIO: 1.6 (calc) (ref 1.0–2.5)
ALT: 26 U/L (ref 6–29)
AST: 19 U/L (ref 10–35)
Albumin: 4.2 g/dL (ref 3.6–5.1)
Alkaline phosphatase (APISO): 106 U/L (ref 33–130)
BUN: 13 mg/dL (ref 7–25)
CO2: 23 mmol/L (ref 20–32)
Calcium: 9 mg/dL (ref 8.6–10.4)
Chloride: 105 mmol/L (ref 98–110)
Creat: 0.71 mg/dL (ref 0.60–0.93)
GLUCOSE: 78 mg/dL (ref 65–99)
Globulin: 2.7 g/dL (calc) (ref 1.9–3.7)
Potassium: 4.3 mmol/L (ref 3.5–5.3)
SODIUM: 138 mmol/L (ref 135–146)
TOTAL PROTEIN: 6.9 g/dL (ref 6.1–8.1)
Total Bilirubin: 0.4 mg/dL (ref 0.2–1.2)

## 2018-03-22 LAB — CBC WITH DIFFERENTIAL/PLATELET
BASOS ABS: 49 {cells}/uL (ref 0–200)
Basophils Relative: 1.2 %
EOS PCT: 1.7 %
Eosinophils Absolute: 70 cells/uL (ref 15–500)
HCT: 38.6 % (ref 35.0–45.0)
HEMOGLOBIN: 13.1 g/dL (ref 11.7–15.5)
Lymphs Abs: 1394 cells/uL (ref 850–3900)
MCH: 30.4 pg (ref 27.0–33.0)
MCHC: 33.9 g/dL (ref 32.0–36.0)
MCV: 89.6 fL (ref 80.0–100.0)
MONOS PCT: 11.9 %
MPV: 11 fL (ref 7.5–12.5)
NEUTROS ABS: 2099 {cells}/uL (ref 1500–7800)
Neutrophils Relative %: 51.2 %
Platelets: 152 10*3/uL (ref 140–400)
RBC: 4.31 10*6/uL (ref 3.80–5.10)
RDW: 13.4 % (ref 11.0–15.0)
Total Lymphocyte: 34 %
WBC mixed population: 488 cells/uL (ref 200–950)
WBC: 4.1 10*3/uL (ref 3.8–10.8)

## 2018-03-22 LAB — LIPID PANEL
Cholesterol: 281 mg/dL — ABNORMAL HIGH (ref <200)
HDL: 74 mg/dL (ref >50)
LDL CHOLESTEROL (CALC): 182 mg/dL — AB
NON-HDL CHOLESTEROL (CALC): 207 mg/dL — AB (ref <130)
TRIGLYCERIDES: 121 mg/dL (ref <150)
Total CHOL/HDL Ratio: 3.8 (calc) (ref <5.0)

## 2018-03-22 MED ORDER — LEVETIRACETAM 500 MG PO TABS: 500.0000 mg | ORAL_TABLET | Freq: Two times a day (BID) | ORAL | 1 refills | Status: DC

## 2018-03-22 NOTE — Assessment & Plan Note (Signed)
Discussed importance of statin with CVA history  She declines taking Discussed other cholesterol meds do not show any research to help prevent future cad/stroke

## 2018-03-22 NOTE — Progress Notes (Signed)
   Subjective:    Patient ID: Molly Patel, female    DOB: April 02, 1946, 72 y.o.   MRN: [id number]  Patient presents for Follow-up (is not fasting)   Pt here to f/u chronic medical problems      Cognitive decline- she is taking aricept doing well, no concerns      Has been having gum problems, with bleeding gums and hyperplasia, she read on dilantin wants to stop the medication, her dentist have not given her any other cause     She has multiple bottles of medication- 2 atenonol  vitamin D 2, atorvastatin has 2 bottles- November   Dilantin 4 bottles , states she has more at home   HTN- atenolol has not taken , had a banana this morning  She decliunes taking further statin drug , states they are "bad for you"  Pedal edema- takes HCTZ most days, occ lasix, will hold HCTZ those days   Review Of Systems:  GEN- denies fatigue, fever, weight loss,weakness, recent illness HEENT- denies eye drainage, change in vision, nasal discharge, CVS- denies chest pain, palpitations RESP- denies SOB, cough, wheeze ABD- denies N/V, change in stools, abd pain GU- denies dysuria, hematuria, dribbling, incontinence MSK- denies joint pain, muscle aches, injury Neuro- denies headache, dizziness, syncope, seizure activity       Objective:    BP (!) 152/80   Pulse 60   Temp 98.5 F (36.9 C) (Oral)   Resp 12   Ht 5\' 7"  (1.702 m)   Wt 177 lb (80.3 kg)   SpO2 99%   BMI 27.72 kg/m  GEN- NAD, alert and oriented x3 HEENT- PERRL, EOMI, non injected sclera, pink conjunctiva, MMM, oropharynx clear Neck- Supple, no bruit CVS- RRR, no murmur RESP-CTAB ABD-NABS,soft,NT,ND EXT- trace pedal edema Pulses- Radial, DP- 2+        Assessment & Plan:      Problem List Items Addressed This Visit      Unprioritized   Hyperlipidemia    Discussed importance of statin with CVA history  She declines taking Discussed other cholesterol meds do not show any research to help prevent future cad/stroke        Relevant Orders   Lipid panel   Hypertension - Primary    Elevated but no meds today, no change to atenolol  Note my nurse called her insurance, stopped further refills and she has about 6 months of medications at home Which she denies she has missed any doses       Relevant Orders   CBC with Differential/Platelet   Comprehensive metabolic panel   S/P craniotomy    Dilantin was for seizure prophylaxis at the low dose Will use keppra 500mg  BID instead , she will switch once new meds come in         Note: This dictation was prepared with Dragon dictation along with smaller phrase technology. Any transcriptional errors that result from this process are unintentional.

## 2018-03-22 NOTE — Assessment & Plan Note (Signed)
Dilantin was for seizure prophylaxis at the low dose Will use keppra 500mg  BID instead , she will switch once new meds come in

## 2018-03-22 NOTE — Assessment & Plan Note (Signed)
Elevated but no meds today, no change to atenolol  Note my nurse called her insurance, stopped further refills and she has about 6 months of medications at home Which she denies she has missed any doses

## 2018-03-22 NOTE — Patient Instructions (Addendum)
We will call the pharmacy  Due for labs today  Stop the dilantin and start Keppra once your new prescription comes  F/U 4-5 months for Physical

## 2018-03-23 ENCOUNTER — Inpatient Hospital Stay: Admit: 2018-03-23 | Discharge: 2018-03-23 | Payer: Self-pay | Attending: Emergency Medicine

## 2018-03-23 ENCOUNTER — Inpatient Hospital Stay
Admit: 2018-03-23 | Discharge: 2018-03-23 | Disposition: A | Discharge status: Home / Self Care | Payer: MEDICARE | Attending: Emergency Medicine

## 2018-03-23 DIAGNOSIS — N390 Urinary tract infection, site not specified: Secondary | ICD-10-CM

## 2018-03-23 LAB — URINE MICROSCOPIC
Casts: 0 /lpf
Crystals, urine: 0 /LPF
Mucus: 0 /lpf

## 2018-03-23 LAB — CBC WITH AUTOMATED DIFF
ABS. BASOPHILS: 0.1 10*3/uL (ref 0.0–0.2)
ABS. EOSINOPHILS: 0.1 10*3/uL (ref 0.0–0.8)
ABS. IMM. GRANS.: 0 10*3/uL (ref 0.0–0.5)
ABS. LYMPHOCYTES: 1.2 10*3/uL (ref 0.5–4.6)
ABS. MONOCYTES: 0.7 10*3/uL (ref 0.1–1.3)
ABS. NEUTROPHILS: 8.4 10*3/uL — ABNORMAL HIGH (ref 1.7–8.2)
ABSOLUTE NRBC: 0 10*3/uL (ref 0.0–0.2)
BASOPHILS: 1 % (ref 0.0–2.0)
EOSINOPHILS: 1 % (ref 0.5–7.8)
HCT: 48.9 % — ABNORMAL HIGH (ref 35.8–46.3)
HGB: 15.7 g/dL — ABNORMAL HIGH (ref 11.7–15.4)
IMMATURE GRANULOCYTES: 0 % (ref 0.0–5.0)
LYMPHOCYTES: 12 % — ABNORMAL LOW (ref 13–44)
MCH: 30.2 PG (ref 26.1–32.9)
MCHC: 32.1 g/dL (ref 31.4–35.0)
MCV: 94 FL (ref 79.6–97.8)
MONOCYTES: 6 % (ref 4.0–12.0)
MPV: 9.9 FL (ref 9.4–12.3)
NEUTROPHILS: 80 % — ABNORMAL HIGH (ref 43–78)
PLATELET: 326 10*3/uL (ref 150–450)
RBC: 5.2 M/uL (ref 4.05–5.2)
RDW: 13.7 % (ref 11.9–14.6)
WBC: 10.4 10*3/uL (ref 4.3–11.1)

## 2018-03-23 LAB — METABOLIC PANEL, BASIC
Anion gap: 8 mmol/L (ref 7–16)
BUN: 12 MG/DL (ref 8–23)
CO2: 28 mmol/L (ref 21–32)
Calcium: 9.2 MG/DL (ref 8.3–10.4)
Chloride: 103 mmol/L (ref 98–107)
Creatinine: 0.9 MG/DL (ref 0.6–1.0)
GFR est AA: >60 mL/min/{1.73_m2} (ref >60)
GFR est non-AA: >60 mL/min/{1.73_m2} (ref >60)
Glucose: 121 mg/dL — ABNORMAL HIGH (ref 65–100)
Potassium: 4.5 mmol/L (ref 3.5–5.1)
Sodium: 139 mmol/L (ref 136–145)

## 2018-03-23 MED ORDER — AMOXICILLIN 500 MG TABLET
500 mg | ORAL_TABLET | Freq: Three times a day (TID) | ORAL | 0 refills | Status: DC
Start: 2018-03-23 — End: 2018-03-23

## 2018-03-23 MED ORDER — CEPHALEXIN 500 MG CAP
500 mg | ORAL_CAPSULE | Freq: Two times a day (BID) | ORAL | 0 refills | Status: AC
Start: 2018-03-23 — End: 2018-03-30

## 2018-03-23 NOTE — ED Notes (Signed)
I have reviewed discharge instructions with the patient.  The patient and caregiver verbalized understanding.    Patient left ED via Discharge Method: ambulatory to Home with daughter    Opportunity for questions and clarification provided.       Patient given 1 scripts.         To continue your aftercare when you leave the hospital, you may receive an automated call from our care team to check in on how you are doing.  This is a free service and part of our promise to provide the best care and service to meet your aftercare needs.??? If you have questions, or wish to unsubscribe from this service please call [phone].  Thank you for Choosing our [facility] Emergency Department.

## 2018-03-23 NOTE — ED Provider Notes (Signed)
Cathy Jackson is a 72 y.o. female who presents to the ED with a chief complaint of pelvic pain in her vaginal region.  She also is concerned that her urostomy bag and cloudy.  She has history of dementia and currently lives with her daughter.  She does not like staying with her daughter would like to move to [place] with other family members.  Denies any direct piece.  Per PCP notes she has lived in [place] in the past and then moved back here with family members here.  She denies any vaginal bleeding.             Past Medical History:   Diagnosis Date   ??? Abdominal pain, unspecified site 10/27/2012   ??? Arthritis     generalized   ??? Asthma 10/27/2012   ??? Bladder cancer ([place])    ??? Depression     managed with medication    ??? Dysuria 10/27/2012   ??? Environmental allergies     managed with medication    ??? GERD (gastroesophageal reflux disease) 10/27/2012   ??? History of kidney stones     states surgical removed   ??? Hypothyroidism     managed with medication    ??? Other "heavy-for-dates" infants     ureterectomy and ileal conduit   ??? Unspecified disorder of bladder 10/27/2012   ??? Urethral caruncle 10/27/2012   ??? Urinary frequency 10/27/2012   ??? Urinary tract infection, site not specified 10/27/2012       Past Surgical History:   Procedure Laterality Date   ??? CLOSE CYSTOSTOMY      cystoscopy/ turbt x 2   ??? HX BREAST BIOPSY Left 2000    x 2   ??? HX CESAREAN SECTION      x 1   ??? HX CHOLECYSTECTOMY     ??? HX NEPHROSTOMY  2014   ??? HX TUBAL LIGATION     ??? HX WRIST FRACTURE TX Left    ??? IR CHANGE INTERNAL URETERAL STENT SI  02/15/2018   ??? IR CONVERT NEPHRO PERC RT TO NEPHROURETERAL CATH EXISTING SI  11/12/2017   ??? IR EXCHANGE NEPHRO PERC RT SI  10/23/2017         Family History:   Problem Relation Age of Onset   ??? Stroke Mother    ??? Heart Disease Mother    ??? Cancer Father    ??? Asthma Father    ??? Lung Disease Father         COPD/smoker   ??? Cancer Brother    ??? Heart Disease Brother    ??? Alcohol abuse Brother        Social History      Socioeconomic History   ??? Marital status: WIDOWED     Spouse name: Not on file   ??? Number of children: Not on file   ??? Years of education: Not on file   ??? Highest education level: Not on file   Occupational History   ??? Not on file   Social Needs   ??? Financial resource strain: Not on file   ??? Food insecurity:     Worry: Not on file     Inability: Not on file   ??? Transportation needs:     Medical: Not on file     Non-medical: Not on file   Tobacco Use   ??? Smoking status: Former Smoker     Packs/day: 0.50     Years: 20.00  Pack years: 10.00   ??? Smokeless tobacco: Never Used   ??? Tobacco comment: stopped at age 55   Substance and Sexual Activity   ??? Alcohol use: No   ??? Drug use: No   ??? Sexual activity: Never   Lifestyle   ??? Physical activity:     Days per week: Not on file     Minutes per session: Not on file   ??? Stress: Not on file   Relationships   ??? Social connections:     Talks on phone: Not on file     Gets together: Not on file     Attends religious service: Not on file     Active member of club or organization: Not on file     Attends meetings of clubs or organizations: Not on file     Relationship status: Not on file   ??? Intimate partner violence:     Fear of current or ex partner: Not on file     Emotionally abused: Not on file     Physically abused: Not on file     Forced sexual activity: Not on file   Other Topics Concern   ??? Not on file   Social History Narrative   ??? Not on file         ALLERGIES: Patient has no known allergies.    Review of Systems   Constitutional: Negative for chills and fever.   Respiratory: Negative for chest tightness, shortness of breath, wheezing and stridor.    Cardiovascular: Negative for chest pain and palpitations.   Gastrointestinal: Negative for abdominal pain, diarrhea, nausea and vomiting.   Skin: Negative.    All other systems reviewed and are negative.      Vitals:    03/23/18 1221   BP: (!) 135/97   Pulse: 80   Resp: 16   Temp: 98.1 ??F (36.7 ??C)   SpO2: 98%    Weight: 54.9 kg (121 lb)   Height: 5\' 2"  (1.575 m)            Physical Exam   Constitutional: She appears well-developed and well-nourished. No distress.   HENT:   Head: Normocephalic and atraumatic.   Right Ear: External ear normal.   Left Ear: External ear normal.   Eyes: Pupils are equal, round, and reactive to light. Conjunctivae and EOM are normal. Right eye exhibits no discharge. Left eye exhibits no discharge. No scleral icterus.   Neck: Normal range of motion. No thyromegaly present.   Cardiovascular: Intact distal pulses.   Pulmonary/Chest: Effort normal. No respiratory distress.   Abdominal: Soft. There is no tenderness.   Genitourinary:   Genitourinary Comments: Normal speculum exam no signs of trauma.  No signs of infection.   Neurological: She is alert.   Skin: She is not diaphoretic.   Psychiatric: She has a normal mood and affect. Her behavior is normal.   Nursing note and vitals reviewed.       MDM  Number of Diagnoses or Management Options  Diagnosis management comments: I spoke with the patient's daughter who is here and seems very reasonable.  Everything she has done seems to be for  Mother's best interest.    Unfortunately patient does have some paranoid dementia they have PCP and are going through legal channels to get formal custody.  Patient will go home with family as this seems like the safest discharge.  We'll treat her urinary tract infection.    Freddi Che, MD;  03/23/2018 @2 :11 PM Voice dictation software was used during the making of this note.  This software is not perfect and grammatical and other typographical errors may be present.  This note has not been proofread for errors.  ===================================================================        Amount and/or Complexity of Data Reviewed  Clinical lab tests: ordered and reviewed (Results for orders placed or performed during the hospital encounter of 03/23/18  -CBC WITH AUTOMATED DIFF        Result                      Value             Ref Range           WBC                         10.4              4.3 - 11.1 K*       RBC                         5.20              4.05 - 5.2 M*       HGB                         15.7 (H)          11.7 - 15.4 *       HCT                         48.9 (H)          35.8 - 46.3 %       MCV                         94.0              79.6 - 97.8 *       MCH                         30.2              26.1 - 32.9 *       MCHC                        32.1              31.4 - 35.0 *       RDW                         13.7              11.9 - 14.6 %       PLATELET                    326               150 - 450 K/*       MPV                         9.9  9.4 - 12.3 FL       ABSOLUTE NRBC               0.00              0.0 - 0.2 K/*       DF                          AUTOMATED                             NEUTROPHILS                 80 (H)            43 - 78 %           LYMPHOCYTES                 12 (L)            13 - 44 %           MONOCYTES                   6                 4.0 - 12.0 %        EOSINOPHILS                 1                 0.5 - 7.8 %         BASOPHILS                   1                 0.0 - 2.0 %         IMMATURE GRANULOCYTES       0                 0.0 - 5.0 %         ABS. NEUTROPHILS            8.4 (H)           1.7 - 8.2 K/*       ABS. LYMPHOCYTES            1.2               0.5 - 4.6 K/*       ABS. MONOCYTES              0.7               0.1 - 1.3 K/*       ABS. EOSINOPHILS            0.1               0.0 - 0.8 K/*       ABS. BASOPHILS              0.1               0.0 - 0.2 K/*       ABS. IMM. GRANS.            0.0               0.0 - 0.5 K/*  -METABOLIC PANEL, BASIC  Result                      Value             Ref Range           Sodium                      139               136 - 145 mm*       Potassium                   4.5               3.5 - 5.1 mm*       Chloride                    103               98 - 107 mmo*        CO2                         28                21 - 32 mmol*       Anion gap                   8                 7 - 16 mmol/L       Glucose                     121 (H)           65 - 100 mg/*       BUN                         12                8 - 23 MG/DL        Creatinine                  0.90              0.6 - 1.0 MG*       GFR est AA                  >60               >60 ml/min/1*       GFR est non-AA              >60               >60 ml/min/1*       Calcium                     9.2               8.3 - 10.4 M*  -URINE MICROSCOPIC       Result                      Value             Ref Range           WBC  50-100            0 /hpf              RBC                         5-10              0 /hpf              Epithelial cells            0-3               0 /hpf              Bacteria                    TRACE             0 /hpf              Casts                       0                 0 /lpf              Crystals, urine             0                 0 /LPF              Amorphous Crystals          1+ (H)            0                   Mucus                       0                 0 /lpf        )           Procedures
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
## 2018-03-23 NOTE — Progress Notes (Signed)
Pt on keflex await final results

## 2018-03-23 NOTE — Progress Notes (Signed)
Patient was placed on Keflex and the urine is susceptible. Continue current medication.

## 2018-03-26 ENCOUNTER — Telehealth: Payer: Self-pay | Admitting: *Deleted

## 2018-03-26 ENCOUNTER — Inpatient Hospital Stay: Payer: MEDICARE | Attending: Community Health | Primary: Community Health

## 2018-03-26 LAB — CULTURE, URINE
Culture result:: 10000
Culture result:: >100000 — AB

## 2018-03-26 NOTE — Telephone Encounter (Signed)
Patient returned call about labs.   States that since her labs are elevated, she will take the statin.   MD please advise.

## 2018-03-28 NOTE — Telephone Encounter (Signed)
Restart her lipitor 10mg . She should have plenty at home

## 2018-03-29 MED ORDER — ATORVASTATIN CALCIUM 10 MG PO TABS: 10.0000 mg | ORAL_TABLET | Freq: Every day | ORAL | 3 refills | Status: DC

## 2018-03-29 NOTE — Telephone Encounter (Signed)
Call placed to patient and patient made aware. Verbalized understanding.  

## 2018-04-01 ENCOUNTER — Ambulatory Visit: Admit: 2018-04-01 | Discharge: 2018-04-01 | Payer: MEDICARE | Attending: Family Medicine | Primary: Community Health

## 2018-04-01 DIAGNOSIS — R109 Unspecified abdominal pain: Secondary | ICD-10-CM

## 2018-04-01 LAB — AMB POC URINALYSIS DIP STICK AUTO W/ MICRO
Bilirubin (UA POC): NEGATIVE
Glucose (UA POC): NEGATIVE
Nitrites (UA POC): POSITIVE
Specific gravity (UA POC): 1 — AB (ref 1.001–1.035)
Urobilinogen (UA POC): 0.2 (ref 0.2–1)
pH (UA POC): 6 (ref 4.6–8.0)

## 2018-04-01 MED ORDER — CIPROFLOXACIN 500 MG TAB
500 mg | ORAL_TABLET | Freq: Two times a day (BID) | ORAL | 0 refills | Status: DC
Start: 2018-04-01 — End: 2018-04-15

## 2018-04-01 MED ORDER — FOOD SUPPLEMENT, LACTOSE-FREE ORAL LIQUID
Freq: Four times a day (QID) | ORAL | 3 refills | Status: AC
Start: 2018-04-01 — End: ?

## 2018-04-01 NOTE — Progress Notes (Signed)
NOTIFY HER THAT HER URINE CULTURE IS POSITIVE AND THAT SHE NEEDS TO FINISH ANTIBIOTICS AND MAKE APPT IN 3 WEEKS TO RECHECK URINE

## 2018-04-01 NOTE — Progress Notes (Signed)
Cathy  Grove Coral Jackson, Cathy Jackson  Cathy Jackson, DOB: 06-13-1946, AGE: 72 y.o.  Chief Complaint   Patient presents with   ??? Chest Pain     Pt c/o left sided chest pains on and off for one year.    ??? Hospital Follow Up     Pt had UTI and went to [facility] ER 9 days ago and pt has completed the antibiotic treatment. Pt still c/o abdominal pain.    ??? Weight Loss     Pt daughter is concerned with pts weight loss.    ??? Abdominal Pain     Pt c/o still having abdominal pain even after uti treatment and pt had urostomy bag.     Visit Vitals  BP 143/78 (BP 1 Location: Right arm, BP Patient Position: Sitting)   Pulse 99   Temp 97.8 ??F (36.6 ??C) (Oral)   Resp 16   Ht 5\' 2"  (1.575 m)   Wt 116 lb 3.2 oz (52.7 kg)   SpO2 95%   BMI 21.25 kg/m??     Social History     Tobacco Use   ??? Smoking status: Former Smoker     Packs/day: 0.50     Years: 20.00     Pack years: 10.00   ??? Smokeless tobacco: Never Used   ??? Tobacco comment: stopped at age 68   Substance Use Topics   ??? Alcohol use: No   ??? Drug use: No     Current Outpatient Medications   Medication Sig Dispense Refill   ??? food supplemt, lactose-reduced (ENSURE ACTIVE CLEAR) liqd Take 237 mL by mouth four (4) times daily. 30 Can 3   ??? ciprofloxacin HCl (CIPRO) 500 mg tablet Take 1 Tab by mouth two (2) times a day. 20 Tab 0   ??? simvastatin (ZOCOR) 20 mg tablet Take 1 Tab by mouth.     ??? donepezil (ARICEPT) 10 mg tablet Take 1 Tab by mouth.     ??? divalproex DR (DEPAKOTE) 250 mg tablet Take 250 mg by mouth once over twenty-four (24) hours.     ??? calcium carb-vitamin D3-vit K2 600 mg-1,000 unit-90 mcg tab Take  by mouth.     ??? cyanocobalamin 50 mcg tablet Take 50 mcg by mouth.     ??? famotidine (PEPCID) 20 mg tablet Take 20 mg by mouth.     ??? PARoxetine (PAXIL) 20 mg tablet Take 20 mg by mouth.     ??? senna-docusate (PERICOLACE) 8.6-50 mg per tablet Take  by mouth.      ??? calcium carbonate (OS-CAL) 500 mg calcium (1,250 mg) tablet Take  by mouth daily.     ??? ascorbic acid, vitamin C, (VITAMIN C) 1,000 mg tablet Take  by mouth.     ??? ferrous sulfate (IRON) 325 mg (65 mg iron) tablet Take  by mouth Daily (before breakfast).       Not on File  Health Maintenance Topics with due status: Overdue       Topic Date Due    Hepatitis C Screening 1946/04/01    DTaP/Tdap/Td series 11/24/1964    Shingrix Vaccine Age 51> 11/24/1993    FOBT Q 1 YEAR AGE 74-75 11/24/1993    GLAUCOMA SCREENING Q2Y 11/24/2008    Bone Densitometry (Dexa) Screening 11/24/2008    Pneumococcal 65+ years 11/24/2008    MEDICARE YEARLY EXAM 11/11/2016    BREAST  CANCER SCRN MAMMOGRAM 01/03/2017       HPI   History of Present Illness  NOTE THAT SHE HAS A UROSTOMY TUBE THAT IS FOLLOWED BY DR Annye Rusk BY UROLOGY    Bladder Infection    The history is provided by the patient. This is a chronic (SEEN AND TREATED IN THE ER----URINE CULTURE GREW OUT BACTERIA --WAS SENSITIVE TO CEPHALOSPORIN BUT STILL HAS INFECTION) problem. The current episode started more than 1 week ago. The problem occurs every urination. The problem has not changed since onset.The pain is at a severity of 3/10. The pain is mild. There has been no fever. Associated symptoms include frequency and abdominal pain. Pertinent negatives include no chills, no sweats, no nausea, no vomiting and no discharge.   Dementia    The history is provided by the patient. This is a chronic problem. The current episode started more than 1 week ago. The problem has been gradually worsening. Pertinent negatives include no confusion, no somnolence, no seizures, no self-injury and no violence. Mental status baseline is mild dementia.  Her past medical history does not include seizures, CVA, TIA or psychotropic medication treatment.   Abdominal Pain   This is a new problem. The current episode started more than 1 week ago.  The problem has been gradually worsening. Associated symptoms include abdominal pain. Pertinent negatives include no chest pain and no shortness of breath.   Weight Loss   Associated symptoms include abdominal pain. Pertinent negatives include no chest pain and no shortness of breath.   Malaise   Associated symptoms include abdominal pain. Pertinent negatives include no chest pain and no shortness of breath.       Reviewed pertinent past medical history, surgical history, social history, and family history as noted in patient record.     ROS  Review of Systems   Constitutional: Negative for chills and fever.   Respiratory: Negative for shortness of breath.    Cardiovascular: Negative for chest pain and leg swelling.   Gastrointestinal: Positive for abdominal pain. Negative for diarrhea, nausea and vomiting.   Genitourinary: Positive for frequency.   Neurological: Negative for seizures.   Psychiatric/Behavioral: Negative for confusion and self-injury.       Physical Exam   Constitutional: She is well-developed, well-nourished, and in no distress.   HENT:   Head: Normocephalic and atraumatic.   Eyes: Conjunctivae are normal.   Neck: Neck supple.   Cardiovascular: Normal rate, regular rhythm and normal heart sounds.   Pulmonary/Chest: Effort normal and breath sounds normal.   Musculoskeletal: She exhibits no edema.   Neurological: She is alert.   Psychiatric: Affect normal.   Nursing note and vitals reviewed.      Lab Results  Recent Results (from the past 8 hour(s))   AMB POC URINALYSIS DIP STICK AUTO W/ MICRO    Collection Time: 04/01/18 10:30 AM   Result Value Ref Range    Color (UA POC) Yellow     Clarity (UA POC) Turbid     Glucose (UA POC) Negative Negative    Bilirubin (UA POC) Negative Negative    Ketones (UA POC) 3+ Negative    Specific gravity (UA POC) 1.000 (A) 1.001 - 1.035    Blood (UA POC) 4+ Negative    pH (UA POC) 6.0 4.6 - 8.0    Protein (UA POC) 3+ Negative    Urobilinogen (UA POC) 0.2 mg/dL 0.2 - 1     Nitrites (UA POC) Positive Negative    Leukocyte  esterase (UA POC) 4+ Negative       Assessment & Plan  Cathy Jackson presents for a chronic care visit. Pre-visit preparations were complete prior to appointment. Barriers to treatment goals include: none  Self-management abilities include: takes medication as prescribed, diet, exercise, attempts to manage weight, checks blood sugar, checks blood pressure, keeps logs, smoking cessation    Diagnoses and all orders for this visit:    1. Abdominal pain, unspecified abdominal location  -     AMB POC URINALYSIS DIP STICK AUTO W/ MICRO-GLUNEG BIL NEG BLO LARGE PRO GREATER THAN 300 NITPOS LEU LARGE WBC TNTC RBC TNTC BACTERIA MANY  -     AMB POC URINALYSIS DIP STICK AUTO W/ MICRO  -     CULTURE, URINE  -     CT ABD PELV W WO CONT; Future  -     REFERRAL TO UROLOGY  -     ciprofloxacin HCl (CIPRO) 500 mg tablet; Take 1 Tab by mouth two (2) times a day.    2. Urinary tract infection without hematuria, site unspecified-HOLD ARICEPT WHILE TAKING CIPRO  -     AMB POC URINALYSIS DIP STICK AUTO W/ MICRO  -     AMB POC URINALYSIS DIP STICK AUTO W/ MICRO  -     CULTURE, URINE  -     REFERRAL TO UROLOGY  -     ciprofloxacin HCl (CIPRO) 500 mg tablet; Take 1 Tab by mouth two (2) times a day.    3. Other chest pain  -     AMB POC EKG ROUTINE W/ 12 LEADS, INTER & REP-NORMAL SINUS RHYTHM WITH NORMAL LIMITS    4. Weight loss  -     food supplemt, lactose-reduced (ENSURE ACTIVE CLEAR) liqd; Take 237 mL by mouth four (4) times daily.    5. Malaise  -     TSH 3RD GENERATION    6. Abnormal glucose  -     HEMOGLOBIN A1C WITH EAG    7. History of urostomy  -     REFERRAL TO UROLOGY  -     ciprofloxacin HCl (CIPRO) 500 mg tablet; Take 1 Tab by mouth two (2) times a day.    8. Recurrent UTI  -     REFERRAL TO UROLOGY  -     ciprofloxacin HCl (CIPRO) 500 mg tablet; Take 1 Tab by mouth two (2) times a day.        Patient and/or caretaker counseled on the importance of balanced diet,  exercise, weight management, and medication adherence. Addressed any identified treatment barriers. Care plans, self-management plans, and any necessary patient education handouts and self-management tools provided.    Follow-up and Dispositions    ?? Return in about 1 week (around 04/08/2018) for  TO  SEE [place].           Morene Crocker, MD

## 2018-04-01 NOTE — Progress Notes (Signed)
Patients daughter notified of lab results.  Told pt to keep her Friday appt.

## 2018-04-01 NOTE — Progress Notes (Signed)
Notify that labs are stable ; aic 5.7--early prediabetes

## 2018-04-02 LAB — TSH 3RD GENERATION: TSH: 1.95 u[IU]/mL (ref 0.450–4.500)

## 2018-04-02 LAB — HEMOGLOBIN A1C WITH EAG
Estimated average glucose: 117 mg/dL
Hemoglobin A1c: 5.7 % — ABNORMAL HIGH (ref 4.8–5.6)

## 2018-04-03 LAB — CULTURE, URINE

## 2018-04-05 DIAGNOSIS — I10 Essential (primary) hypertension: Secondary | ICD-10-CM | POA: Diagnosis not present

## 2018-04-05 DIAGNOSIS — R413 Other amnesia: Secondary | ICD-10-CM | POA: Diagnosis not present

## 2018-04-05 DIAGNOSIS — E663 Overweight: Secondary | ICD-10-CM | POA: Diagnosis not present

## 2018-04-05 DIAGNOSIS — G40909 Epilepsy, unspecified, not intractable, without status epilepticus: Secondary | ICD-10-CM | POA: Diagnosis not present

## 2018-04-05 DIAGNOSIS — E785 Hyperlipidemia, unspecified: Secondary | ICD-10-CM | POA: Diagnosis not present

## 2018-04-05 DIAGNOSIS — H9190 Unspecified hearing loss, unspecified ear: Secondary | ICD-10-CM | POA: Diagnosis not present

## 2018-04-05 DIAGNOSIS — Z6827 Body mass index (BMI) 27.0-27.9, adult: Secondary | ICD-10-CM | POA: Diagnosis not present

## 2018-04-05 DIAGNOSIS — R600 Localized edema: Secondary | ICD-10-CM | POA: Diagnosis not present

## 2018-04-05 DIAGNOSIS — R001 Bradycardia, unspecified: Secondary | ICD-10-CM | POA: Diagnosis not present

## 2018-04-09 ENCOUNTER — Encounter: Attending: Community Health | Primary: Community Health

## 2018-04-12 ENCOUNTER — Ambulatory Visit: Admit: 2018-04-12 | Discharge: 2018-04-12 | Payer: MEDICARE | Attending: Community Health | Primary: Community Health

## 2018-04-12 DIAGNOSIS — N3001 Acute cystitis with hematuria: Secondary | ICD-10-CM

## 2018-04-12 LAB — AMB POC URINALYSIS DIP STICK AUTO W/O MICRO
Bilirubin (UA POC): NEGATIVE
Glucose (UA POC): NEGATIVE
Ketones (UA POC): NEGATIVE
Nitrites (UA POC): POSITIVE
Specific gravity (UA POC): 1.02 (ref 1.001–1.035)
Urobilinogen (UA POC): 0.2 (ref 0.2–1)
pH (UA POC): 7 (ref 4.6–8.0)

## 2018-04-12 MED ORDER — CEFTRIAXONE 1 GRAM SOLUTION FOR INJECTION
1 gram | Freq: Once | INTRAMUSCULAR | Status: AC
Start: 2018-04-12 — End: 2018-04-12
  Administered 2018-04-12: 20:00:00 via INTRAMUSCULAR

## 2018-04-12 NOTE — ACP (Advance Care Planning) (Signed)
Advance Care Planning (ACP)       Attempted to discuss ACP with Cathy Jackson today, she declines to discuss at this time.  Will readdress at future visit.

## 2018-04-12 NOTE — Progress Notes (Signed)
Patient in ED DT. Spoke to MD and let her know that urine culture results are positive and she is being treated with cipro and it is resistant. I will follow up with patient post discharge

## 2018-04-12 NOTE — Progress Notes (Signed)
Cathy Roam FNP  [facility]  [place] [address]  Ph [phone] Fax [phone]    Cathy Jackson, DOB: 03/18/1946, AGE: 72 y.o.  Chief Complaint   Patient presents with   ??? Follow-up     pt was here 1 week ago for abdominal pain, and vaginal pain. pt is currently taking abx and states pain is still present.     Visit Vitals  BP 120/60 (BP 1 Location: Left arm, BP Patient Position: Sitting)   Pulse 80   Temp 97.9 ??F (36.6 ??C) (Oral)   Resp 18   Ht 5\' 2"  (1.575 m)   Wt 122 lb 3.2 oz (55.4 kg)   SpO2 96%   BMI 22.35 kg/m??     Social History     Tobacco Use   ??? Smoking status: Former Smoker     Packs/day: 0.50     Years: 20.00     Pack years: 10.00   ??? Smokeless tobacco: Never Used   ??? Tobacco comment: stopped at age 66   Substance Use Topics   ??? Alcohol use: No   ??? Drug use: No     Current Outpatient Medications   Medication Sig Dispense Refill   ??? divalproex ER (DEPAKOTE ER) 500 mg ER tablet Take 500 mg by mouth.     ??? food supplemt, lactose-reduced (ENSURE ACTIVE CLEAR) liqd Take 237 mL by mouth four (4) times daily. 30 Can 3   ??? ciprofloxacin HCl (CIPRO) 500 mg tablet Take 1 Tab by mouth two (2) times a day. 20 Tab 0   ??? simvastatin (ZOCOR) 20 mg tablet Take 1 Tab by mouth.     ??? donepezil (ARICEPT) 10 mg tablet Take 1 Tab by mouth.     ??? calcium carb-vitamin D3-vit K2 600 mg-1,000 unit-90 mcg tab Take  by mouth.     ??? cyanocobalamin 50 mcg tablet Take 50 mcg by mouth.     ??? famotidine (PEPCID) 20 mg tablet Take 20 mg by mouth.     ??? PARoxetine (PAXIL) 20 mg tablet Take 20 mg by mouth.     ??? senna-docusate (PERICOLACE) 8.6-50 mg per tablet Take  by mouth.     ??? calcium carbonate (OS-CAL) 500 mg calcium (1,250 mg) tablet Take  by mouth daily.     ??? ascorbic acid, vitamin C, (VITAMIN C) 1,000 mg tablet Take  by mouth.     ??? ferrous sulfate (IRON) 325 mg (65 mg iron) tablet Take  by mouth Daily (before breakfast).        Current Facility-Administered Medications   Medication Dose Route Frequency Provider Last Rate Last Dose   ??? cefTRIAXone (ROCEPHIN) injection 1 g  1 g IntraMUSCular ONCE Geovanie Winnett, Mariann Laster, NP         No Known Allergies  HPI   History of Present Illness  [name] presents today for follow up. She has had recurrent urinary tract infections and pelvic discomfort. She was seen last by Dr. Donney Rankins. She has a CT abd and pelvis scheduled this week and a follow up with Urology. She is know to them. She has 2 days left of cipro. I will add rocephin 1 gram today since she is still positive for nitrates and leuks. Will send off for culture again. Discussed with patient and daughter the importance of monitoring for fever, alteration in mentation and blood pressure. She verbalizes understanding. She denies sob,  chest pain, palpitations or fever at this time.     Reviewed pertinent past medical history, surgical history, social history, and family history as noted in patient record.     ROS  Review of Systems   Constitutional: Negative for chills, fever and malaise/fatigue.   HENT: Negative for congestion, ear pain, sinus pain and sore throat.    Eyes: Negative for pain.   Respiratory: Negative for cough, sputum production, shortness of breath and wheezing.    Cardiovascular: Negative for chest pain and palpitations.   Gastrointestinal: Negative for abdominal pain, blood in stool, constipation, diarrhea, nausea and vomiting.   Genitourinary: Positive for dysuria, frequency, hematuria and urgency.        Pelvic pain   Musculoskeletal: Negative for myalgias.   Neurological: Negative for dizziness, weakness and headaches.   Psychiatric/Behavioral: Negative for depression.     Physical Exam   Constitutional: She is oriented to person, place, and time and well-developed, well-nourished, and in no distress.   HENT:   Head: Normocephalic and atraumatic.   Right Ear: External ear normal.   Left Ear: External ear normal.    Nose: Nose normal.   Mouth/Throat: Oropharynx is clear and moist.   Eyes: Conjunctivae are normal.   Neck: Neck supple. No thyromegaly present.   Cardiovascular: Normal rate, regular rhythm and normal heart sounds.   No murmur heard.  Pulmonary/Chest: Effort normal and breath sounds normal. No respiratory distress. She has no wheezes. She has no rales.   Abdominal: There is tenderness in the suprapubic area.   Musculoskeletal: She exhibits no edema.   Lymphadenopathy:     She has no cervical adenopathy.   Neurological: She is alert and oriented to person, place, and time.   Skin: Skin is warm and dry. No rash noted.   Psychiatric: Mood and memory normal.   Nursing note and vitals reviewed.      Recent Results (from the past 8 hour(s))   AMB POC URINALYSIS DIP STICK AUTO W/O MICRO    Collection Time: 04/12/18  3:30 PM   Result Value Ref Range    Color (UA POC) Yellow     Clarity (UA POC) Cloudy     Glucose (UA POC) Negative Negative    Bilirubin (UA POC) Negative Negative    Ketones (UA POC) Negative Negative    Specific gravity (UA POC) 1.020 1.001 - 1.035    Blood (UA POC) 4+ Negative    pH (UA POC) 7.0 4.6 - 8.0    Protein (UA POC) 3+ Negative    Urobilinogen (UA POC) 0.2 mg/dL 0.2 - 1    Nitrites (UA POC) Positive Negative    Leukocyte esterase (UA POC) 4+ Negative       Assessment & Plan  Diagnoses and all orders for this visit:    1. Acute cystitis with hematuria  -     cefTRIAXone (ROCEPHIN) injection 1 g  -     PR THER/PROPH/DIAG INJECTION, SUBCUT/IM    2. Recurrent UTI  -     AMB POC URINALYSIS DIP STICK AUTO W/O MICRO  -     CULTURE, URINE  -     cefTRIAXone (ROCEPHIN) injection 1 g  -     PR THER/PROPH/DIAG INJECTION, SUBCUT/IM      I will call daughter with urine culture results.     Increase fluids to 2 liters daily of mostly water. Monitor temperature. Return if no better in 48 hours. To ER with high fever greater  than 103, vomiting, abdominal pain, diarrhea, dehydration, not voiding greater than  6 hours, severe back or flank pain.        Follow-up and Dispositions    ?? Return in about 3 months (around 06/28/2018).

## 2018-04-15 ENCOUNTER — Inpatient Hospital Stay: Admit: 2018-04-15 | Payer: MEDICARE | Attending: Family Medicine | Primary: Community Health

## 2018-04-15 ENCOUNTER — Inpatient Hospital Stay: Payer: MEDICARE | Attending: Family Medicine | Primary: Community Health

## 2018-04-15 ENCOUNTER — Inpatient Hospital Stay
Admit: 2018-04-15 | Discharge: 2018-04-15 | Disposition: A | Discharge status: Home / Self Care | Payer: MEDICARE | Attending: Emergency Medicine

## 2018-04-15 ENCOUNTER — Emergency Department: Admit: 2018-04-15 | Payer: MEDICARE | Primary: Community Health

## 2018-04-15 DIAGNOSIS — R109 Unspecified abdominal pain: Secondary | ICD-10-CM

## 2018-04-15 DIAGNOSIS — T83022A Displacement of nephrostomy catheter, initial encounter: Secondary | ICD-10-CM

## 2018-04-15 LAB — METABOLIC PANEL, COMPREHENSIVE
A-G Ratio: 0.6 — ABNORMAL LOW (ref 1.2–3.5)
ALT (SGPT): 7 U/L — ABNORMAL LOW (ref 12–65)
AST (SGOT): 10 U/L — ABNORMAL LOW (ref 15–37)
Albumin: 2.5 g/dL — ABNORMAL LOW (ref 3.2–4.6)
Alk. phosphatase: 67 U/L (ref 50–136)
Anion gap: 5 mmol/L — ABNORMAL LOW (ref 7–16)
BUN: 17 MG/DL (ref 8–23)
Bilirubin, total: 0.3 MG/DL (ref 0.2–1.1)
CO2: 28 mmol/L (ref 21–32)
Calcium: 7.8 MG/DL — ABNORMAL LOW (ref 8.3–10.4)
Chloride: 104 mmol/L (ref 98–107)
Creatinine: 1.1 MG/DL — ABNORMAL HIGH (ref 0.6–1.0)
GFR est AA: >60 mL/min/{1.73_m2} (ref >60)
GFR est non-AA: 52 mL/min/{1.73_m2} — ABNORMAL LOW (ref >60)
Globulin: 4 g/dL — ABNORMAL HIGH (ref 2.3–3.5)
Glucose: 142 mg/dL — ABNORMAL HIGH (ref 65–100)
Potassium: 3.4 mmol/L — ABNORMAL LOW (ref 3.5–5.1)
Protein, total: 6.5 g/dL (ref 6.3–8.2)
Sodium: 137 mmol/L (ref 136–145)

## 2018-04-15 LAB — CBC WITH AUTOMATED DIFF
ABS. BASOPHILS: 0.1 10*3/uL (ref 0.0–0.2)
ABS. EOSINOPHILS: 0.1 10*3/uL (ref 0.0–0.8)
ABS. IMM. GRANS.: 0 10*3/uL (ref 0.0–0.5)
ABS. LYMPHOCYTES: 0.9 10*3/uL (ref 0.5–4.6)
ABS. MONOCYTES: 1 10*3/uL (ref 0.1–1.3)
ABS. NEUTROPHILS: 11.1 10*3/uL — ABNORMAL HIGH (ref 1.7–8.2)
ABSOLUTE NRBC: 0 10*3/uL (ref 0.0–0.2)
BASOPHILS: 0 % (ref 0.0–2.0)
EOSINOPHILS: 0 % — ABNORMAL LOW (ref 0.5–7.8)
HCT: 36.7 % (ref 35.8–46.3)
HGB: 11.3 g/dL — ABNORMAL LOW (ref 11.7–15.4)
IMMATURE GRANULOCYTES: 0 % (ref 0.0–5.0)
LYMPHOCYTES: 7 % — ABNORMAL LOW (ref 13–44)
MCH: 30.3 PG (ref 26.1–32.9)
MCHC: 30.8 g/dL — ABNORMAL LOW (ref 31.4–35.0)
MCV: 98.4 FL — ABNORMAL HIGH (ref 79.6–97.8)
MONOCYTES: 8 % (ref 4.0–12.0)
MPV: 9.8 FL (ref 9.4–12.3)
NEUTROPHILS: 85 % — ABNORMAL HIGH (ref 43–78)
PLATELET: 216 10*3/uL (ref 150–450)
RBC: 3.73 M/uL — ABNORMAL LOW (ref 4.05–5.2)
RDW: 14.1 % (ref 11.9–14.6)
WBC: 13.1 10*3/uL — ABNORMAL HIGH (ref 4.3–11.1)

## 2018-04-15 LAB — CULTURE, URINE

## 2018-04-15 MED ORDER — DIPHENHYDRAMINE HCL 50 MG/ML IJ SOLN
50 mg/mL | INTRAMUSCULAR | Status: AC
Start: 2018-04-15 — End: ?

## 2018-04-15 MED ORDER — IOPAMIDOL 61 % IV SOLN
300 mg iodine /mL (61 %) | Freq: Once | INTRAVENOUS | Status: AC
Start: 2018-04-15 — End: 2018-04-15
  Administered 2018-04-15: 20:00:00

## 2018-04-15 MED ORDER — FENTANYL CITRATE (PF) 50 MCG/ML IJ SOLN
50 mcg/mL | INTRAMUSCULAR | Status: AC
Start: 2018-04-15 — End: ?

## 2018-04-15 MED ORDER — LIDOCAINE HCL 2 % (20 MG/ML) IJ SOLN
20 mg/mL (2 %) | INTRAMUSCULAR | Status: AC
Start: 2018-04-15 — End: ?

## 2018-04-15 MED ORDER — MIDAZOLAM 1 MG/ML IJ SOLN
1 mg/mL | INTRAMUSCULAR | Status: DC | PRN
Start: 2018-04-15 — End: 2018-04-15
  Administered 2018-04-15 (×5): via INTRAVENOUS

## 2018-04-15 MED ORDER — FENTANYL CITRATE (PF) 50 MCG/ML IJ SOLN
50 mcg/mL | INTRAMUSCULAR | Status: DC | PRN
Start: 2018-04-15 — End: 2018-04-15
  Administered 2018-04-15 (×5): via INTRAVENOUS

## 2018-04-15 MED ORDER — IOPAMIDOL 76 % IV SOLN
370 mg iodine /mL (76 %) | Freq: Once | INTRAVENOUS | Status: AC
Start: 2018-04-15 — End: 2018-04-15
  Administered 2018-04-15: 13:00:00 via INTRAVENOUS

## 2018-04-15 MED ORDER — SALINE PERIPHERAL FLUSH PRN
Freq: Once | INTRAMUSCULAR | Status: AC
Start: 2018-04-15 — End: 2018-04-15
  Administered 2018-04-15: 13:00:00

## 2018-04-15 MED ORDER — LIDOCAINE HCL 2 % (20 MG/ML) IJ SOLN
20 mg/mL (2 %) | Freq: Once | INTRAMUSCULAR | Status: DC
Start: 2018-04-15 — End: 2018-04-15

## 2018-04-15 MED ORDER — DIATRIZOATE MEGLUMINE & SODIUM 66 %-10 % ORAL SOLN
66-10 % | Freq: Once | ORAL | Status: AC
Start: 2018-04-15 — End: 2018-04-15
  Administered 2018-04-15: 13:00:00 via ORAL

## 2018-04-15 MED ORDER — SODIUM CHLORIDE 0.9 % IV
INTRAVENOUS | Status: DC
Start: 2018-04-15 — End: 2018-04-15
  Administered 2018-04-15: 19:00:00 via INTRAVENOUS

## 2018-04-15 MED ORDER — SODIUM CHLORIDE 0.9% BOLUS IV
0.9 % | Freq: Once | INTRAVENOUS | Status: AC
Start: 2018-04-15 — End: 2018-04-15
  Administered 2018-04-15: 13:00:00 via INTRAVENOUS

## 2018-04-15 MED ORDER — DIPHENHYDRAMINE HCL 50 MG/ML IJ SOLN
50 mg/mL | Freq: Once | INTRAMUSCULAR | Status: AC
Start: 2018-04-15 — End: 2018-04-15
  Administered 2018-04-15: 19:00:00 via INTRAVENOUS

## 2018-04-15 MED ORDER — TRIMETHOPRIM-SULFAMETHOXAZOLE 160 MG-800 MG TAB
160-800 mg | ORAL_TABLET | Freq: Two times a day (BID) | ORAL | 0 refills | Status: DC
Start: 2018-04-15 — End: 2018-06-01

## 2018-04-15 MED ORDER — MIDAZOLAM 1 MG/ML IJ SOLN
1 mg/mL | INTRAMUSCULAR | Status: AC
Start: 2018-04-15 — End: ?

## 2018-04-15 MED ORDER — ADV ADDAPTOR
1 gram | Status: AC
Start: 2018-04-15 — End: 2018-04-15
  Administered 2018-04-15: 18:00:00 via INTRAVENOUS

## 2018-04-15 MED FILL — XYLOCAINE 20 MG/ML (2 %) INJECTION SOLUTION: 20 mg/mL (2 %) | INTRAMUSCULAR | Qty: 20

## 2018-04-15 MED FILL — MIDAZOLAM 1 MG/ML IJ SOLN: 1 mg/mL | INTRAMUSCULAR | Qty: 4

## 2018-04-15 MED FILL — FENTANYL CITRATE (PF) 50 MCG/ML IJ SOLN: 50 mcg/mL | INTRAMUSCULAR | Qty: 4

## 2018-04-15 MED FILL — DIPHENHYDRAMINE HCL 50 MG/ML IJ SOLN: 50 mg/mL | INTRAMUSCULAR | Qty: 1

## 2018-04-15 MED FILL — FENTANYL CITRATE (PF) 50 MCG/ML IJ SOLN: 50 mcg/mL | INTRAMUSCULAR | Qty: 2

## 2018-04-15 MED FILL — CEFEPIME 1 GRAM SOLUTION FOR INJECTION: 1 gram | INTRAMUSCULAR | Qty: 1

## 2018-04-15 MED FILL — MIDAZOLAM 1 MG/ML IJ SOLN: 1 mg/mL | INTRAMUSCULAR | Qty: 2

## 2018-04-15 NOTE — ED Triage Notes (Signed)
Patient arrives via wheelchair. Sent from CT scan for dislodged nephrostomy tube

## 2018-04-15 NOTE — Progress Notes (Signed)
TRANSFER - OUT REPORT:    Verbal report given to Juanda Crumble, RN on [name]  being transferred to IR prep room 1 for routine post - op       Report consisted of patient???s Situation, Background, Assessment and   Recommendations(SBAR).     Information from the following report(s) SBAR, Kardex, Procedure Summary and MAR was reviewed with the receiving nurse.    Lines:   Peripheral IV 04/15/18 Right Antecubital (Active)   Site Assessment Clean, dry, & intact 04/15/2018  1:53 PM   Phlebitis Assessment 0 04/15/2018  1:53 PM   Infiltration Assessment 0 04/15/2018  1:53 PM   Dressing Status Clean, dry, & intact 04/15/2018  1:53 PM   Dressing Type 4 X 4 04/15/2018  1:53 PM        Opportunity for questions and clarification was provided.

## 2018-04-15 NOTE — Progress Notes (Incomplete)
Patient arrived to IR procedure room 1.

## 2018-04-15 NOTE — ED Notes (Signed)
Patient resting in her bed at this time with bed in lowest position, family at bedside, VSS.

## 2018-04-15 NOTE — ED Notes (Signed)
I have reviewed discharge instructions with the patient.  The patient verbalized understanding.    Patient left ED via Discharge Method: stretcher to Home with family.    Opportunity for questions and clarification provided.       Patient given 1 scripts.         To continue your aftercare when you leave the hospital, you may receive an automated call from our care team to check in on how you are doing.  This is a free service and part of our promise to provide the best care and service to meet your aftercare needs.??? If you have questions, or wish to unsubscribe from this service please call [phone].  Thank you for Choosing our [facility] Emergency Department.

## 2018-04-15 NOTE — ED Notes (Signed)
I have reviewed discharge instructions with the patient.  The patient verbalized understanding.    Patient left ED via Discharge Method: ambulatory to Home with  daughter.    Opportunity for questions and clarification provided.       Patient given 0 scripts.         To continue your aftercare when you leave the hospital, you may receive an automated call from our care team to check in on how you are doing.  This is a free service and part of our promise to provide the best care and service to meet your aftercare needs.??? If you have questions, or wish to unsubscribe from this service please call [phone].  Thank you for Choosing our [facility] Emergency Department.

## 2018-04-15 NOTE — Progress Notes (Signed)
Patient returned to IR prep room 1.

## 2018-04-15 NOTE — Progress Notes (Signed)
Pt did go to ER on 04/14/2018.

## 2018-04-15 NOTE — ED Notes (Signed)
Pt back from IR at this time.  

## 2018-04-15 NOTE — Procedures (Signed)
Department of Interventional Radiology  [phone]        Interventional Radiology Brief Procedure Note    Patient: Cathy Jackson MRN: [id number]  SSN: [id number]    Date of Birth: [birth year]  Age: 72 y.o.  Sex: female      Date of Procedure: 04/15/2018    Pre-Procedure Diagnosis: Dysfunctional retrograde nephro-ureteral drain.     Post-Procedure Diagnosis: SAME    Procedure(s): Drain replacement.     Brief Description of Procedure: as above    Performed By: Vladimir Creeks, MD     Assistants: None    Anesthesia:Moderate Sedation    Estimated Blood Loss: Less than 105ml    Specimens:  None    Implants:  Internal/External Biliary Drain used as a Nephro-ureteral drain.     Findings: UPJ stenosis.  Anastomosis stenosis.      Complications: None    Recommendations: Discharge when awake.      Follow Up: 3 months.      Signed By: Vladimir Creeks, MD     April 15, 2018

## 2018-04-15 NOTE — ED Notes (Signed)
Pt awake and talking at this time. Denies pain. No complaints other then being sleepy.

## 2018-04-15 NOTE — ED Provider Notes (Signed)
72 yo female with history of bladder cancer and resultant nephroureteral catheter with ileal conduit presents with dislodged nephrostomy tube from radiology.  She has been having pain with recurrent bladder infections and had a CT today.  She has no current symptoms.  No fever or vomiting.  She was due for a catheter change next week by interventional radiology.  The case had already been discussed with interventional radiology and she will be fit in today for catheter change.           Past Medical History:   Diagnosis Date   ??? Abdominal pain, unspecified site 10/27/2012   ??? Arthritis     generalized   ??? Asthma 10/27/2012   ??? Bladder cancer ([place])    ??? Depression     managed with medication    ??? Dysuria 10/27/2012   ??? Environmental allergies     managed with medication    ??? GERD (gastroesophageal reflux disease) 10/27/2012   ??? History of kidney stones     states surgical removed   ??? Hypothyroidism     managed with medication    ??? Other "heavy-for-dates" infants     ureterectomy and ileal conduit   ??? Unspecified disorder of bladder 10/27/2012   ??? Urethral caruncle 10/27/2012   ??? Urinary frequency 10/27/2012   ??? Urinary tract infection, site not specified 10/27/2012       Past Surgical History:   Procedure Laterality Date   ??? CLOSE CYSTOSTOMY      cystoscopy/ turbt x 2   ??? HX BREAST BIOPSY Left 2000    x 2   ??? HX CESAREAN SECTION      x 1   ??? HX CHOLECYSTECTOMY     ??? HX NEPHROSTOMY  2014   ??? HX TUBAL LIGATION     ??? HX WRIST FRACTURE TX Left    ??? IR CHANGE INTERNAL URETERAL STENT SI  02/15/2018   ??? IR CONVERT NEPHRO PERC RT TO NEPHROURETERAL CATH EXISTING SI  11/12/2017   ??? IR EXCHANGE NEPHRO PERC RT SI  10/23/2017         Family History:   Problem Relation Age of Onset   ??? Stroke Mother    ??? Heart Disease Mother    ??? Cancer Father    ??? Asthma Father    ??? Lung Disease Father         COPD/smoker   ??? Cancer Brother    ??? Heart Disease Brother    ??? Alcohol abuse Brother        Social History     Socioeconomic History    ??? Marital status: WIDOWED     Spouse name: Not on file   ??? Number of children: Not on file   ??? Years of education: Not on file   ??? Highest education level: Not on file   Occupational History   ??? Not on file   Social Needs   ??? Financial resource strain: Not on file   ??? Food insecurity:     Worry: Not on file     Inability: Not on file   ??? Transportation needs:     Medical: Not on file     Non-medical: Not on file   Tobacco Use   ??? Smoking status: Former Smoker     Packs/day: 0.50     Years: 20.00     Pack years: 10.00   ??? Smokeless tobacco: Never Used   ??? Tobacco comment: stopped at  age 27   Substance and Sexual Activity   ??? Alcohol use: No   ??? Drug use: No   ??? Sexual activity: Never   Lifestyle   ??? Physical activity:     Days per week: Not on file     Minutes per session: Not on file   ??? Stress: Not on file   Relationships   ??? Social connections:     Talks on phone: Not on file     Gets together: Not on file     Attends religious service: Not on file     Active member of club or organization: Not on file     Attends meetings of clubs or organizations: Not on file     Relationship status: Not on file   ??? Intimate partner violence:     Fear of current or ex partner: Not on file     Emotionally abused: Not on file     Physically abused: Not on file     Forced sexual activity: Not on file   Other Topics Concern   ??? Not on file   Social History Narrative   ??? Not on file         ALLERGIES: Patient has no known allergies.    Review of Systems   Constitutional: Negative for fever.   Gastrointestinal: Negative for abdominal pain and vomiting.   Genitourinary: Negative for decreased urine volume.   Musculoskeletal: Negative for back pain.       Vitals:    04/15/18 1003   BP: 106/61   Pulse: 84   Resp: 16   Temp: 97.5 ??F (36.4 ??C)   SpO2: 96%   Weight: 55.3 kg (122 lb)   Height: _0  (1.575 m)            Physical Exam   Constitutional: She appears well-developed and well-nourished.   HENT:    Head: Normocephalic and atraumatic.   Eyes: Pupils are equal, round, and reactive to light. EOM are normal.   Neck: Neck supple.   Cardiovascular: Normal rate.   Pulmonary/Chest: Effort normal.   Abdominal: Soft. She exhibits no distension. There is no tenderness.       Musculoskeletal: Normal range of motion.   Neurological: She is alert.   Skin: Skin is dry.   Nursing note and vitals reviewed.       MDM  Number of Diagnoses or Management Options  Diagnosis management comments: Parts of this document were created using dragon voice recognition software. The chart has been reviewed but errors may still be present.    Awaiting IR.    1:16 PM  Urine culture from 8/19 positive for klebsiella pneumonia, multi drug resistant, but sensitive to bactrim. Slight leukocytosis.  Offered hospital admission, but patient referred to go home with Bactrim.  Given Maxime in the emergency department.     I discussed the results of all labs, procedures, radiographs, and treatments with the patient and available family.  Treatment plan is agreed upon and the patient is ready for discharge.  Questions about treatment in the ED and differential diagnosis of presenting condition were answered.  Patient was given verbal discharge instructions including, but not limited to, importance of returning to the emergency department for any concern of worsening or continued symptoms.  Instructions were given to follow up with a primary care provider or specialist within 1-2 days.  Adverse effects of medications, if prescribed, were discussed and patient was advised to refrain from significant  physical activity until followed up by primary care physician and to not drive or operate heavy machinery after taking any sedating substances.           Amount and/or Complexity of Data Reviewed  Clinical lab tests: ordered and reviewed (Results for orders placed or performed during the hospital encounter of 04/15/18  -CBC WITH AUTOMATED DIFF        Result                      Value             Ref Range           WBC                         13.1 (H)          4.3 - 11.1 K*       RBC                         3.73 (L)          4.05 - 5.2 M*       HGB                         11.3 (L)          11.7 - 15.4 *       HCT                         36.7              35.8 - 46.3 %       MCV                         98.4 (H)          79.6 - 97.8 *       MCH                         30.3              26.1 - 32.9 *       MCHC                        30.8 (L)          31.4 - 35.0 *       RDW                         14.1              11.9 - 14.6 %       PLATELET                    216               150 - 450 K/*       MPV                         9.8               9.4 - 12.3 FL       ABSOLUTE NRBC  0.00              0.0 - 0.2 K/*       DF                          AUTOMATED                             NEUTROPHILS                 85 (H)            43 - 78 %           LYMPHOCYTES                 7 (L)             13 - 44 %           MONOCYTES                   8                 4.0 - 12.0 %        EOSINOPHILS                 0 (L)             0.5 - 7.8 %         BASOPHILS                   0                 0.0 - 2.0 %         IMMATURE GRANULOCYTES       0                 0.0 - 5.0 %         ABS. NEUTROPHILS            11.1 (H)          1.7 - 8.2 K/*       ABS. LYMPHOCYTES            0.9               0.5 - 4.6 K/*       ABS. MONOCYTES              1.0               0.1 - 1.3 K/*       ABS. EOSINOPHILS            0.1               0.0 - 0.8 K/*       ABS. BASOPHILS              0.1               0.0 - 0.2 K/*       ABS. IMM. GRANS.            0.0               0.0 - 0.5 K/*  -METABOLIC PANEL, COMPREHENSIVE       Result                      Value  Ref Range           Sodium                      137               136 - 145 mm*       Potassium                   3.4 (L)           3.5 - 5.1 mm*       Chloride                    104               98 - 107 mmo*        CO2                         28                21 - 32 mmol*       Anion gap                   5 (L)             7 - 16 mmol/L       Glucose                     142 (H)           65 - 100 mg/*       BUN                         17                8 - 23 MG/DL        Creatinine                  1.10 (H)          0.6 - 1.0 MG*       GFR est AA                  >60               >60 ml/min/1*       GFR est non-AA              52 (L)            >60 ml/min/1*       Calcium                     7.8 (L)           8.3 - 10.4 M*       Bilirubin, total            0.3               0.2 - 1.1 MG*       ALT (SGPT)                  7 (L)             12 - 65 U/L         AST (SGOT)  10 (L)            15 - 37 U/L         Alk. phosphatase            67                50 - 136 U/L        Protein, total              6.5               6.3 - 8.2 g/*       Albumin                     2.5 (L)           3.2 - 4.6 g/*       Globulin                    4.0 (H)           2.3 - 3.5 g/*       A-G Ratio                   0.6 (L)           1.2 - 3.5      )  Tests in the radiology section of CPT??: reviewed           Procedures

## 2018-04-16 NOTE — Progress Notes (Signed)
I SENT THIS PATIENT TO THE ER YESTERDAY---SEE IF YOU CAN CHECK ER VISIT

## 2018-04-20 ENCOUNTER — Other Ambulatory Visit: Payer: Self-pay | Admitting: Family Medicine

## 2018-04-20 MED ORDER — PAROXETINE 20 MG TAB
20 mg | ORAL_TABLET | Freq: Every day | ORAL | 2 refills | Status: AC
Start: 2018-04-20 — End: ?

## 2018-04-20 MED ORDER — SIMVASTATIN 20 MG TAB
20 mg | ORAL_TABLET | Freq: Every evening | ORAL | 2 refills | Status: DC
Start: 2018-04-20 — End: 2018-06-23

## 2018-04-20 MED ORDER — DONEPEZIL 10 MG TAB
10 mg | ORAL_TABLET | Freq: Every evening | ORAL | 2 refills | Status: DC
Start: 2018-04-20 — End: 2019-04-05

## 2018-04-20 NOTE — Telephone Encounter (Signed)
Refill - Paxil;Simvastatin;Donepezil -  Pharmacy on file

## 2018-04-22 ENCOUNTER — Ambulatory Visit: Payer: MEDICARE | Primary: Community Health

## 2018-04-27 NOTE — Telephone Encounter (Signed)
Patient called regarding a new prescription. Returned call and LM. -MP

## 2018-04-29 NOTE — Telephone Encounter (Signed)
Spoke with patient's daughter. She believes patient may have UTI. She states this has been going on for almost a month. No fever or chills reported. I scheduled appointment to see NP for OV. -MP

## 2018-05-04 ENCOUNTER — Encounter: Payer: MEDICARE | Attending: Adult Health | Primary: Community Health

## 2018-06-01 ENCOUNTER — Ambulatory Visit: Admit: 2018-06-01 | Discharge: 2018-06-01 | Payer: MEDICARE | Attending: Community Health | Primary: Community Health

## 2018-06-01 DIAGNOSIS — N3001 Acute cystitis with hematuria: Secondary | ICD-10-CM

## 2018-06-01 LAB — AMB POC URINALYSIS DIP STICK AUTO W/O MICRO
Bilirubin (UA POC): NEGATIVE
Glucose (UA POC): NEGATIVE
Nitrites (UA POC): POSITIVE
Specific gravity (UA POC): 1.02 (ref 1.001–1.035)
Urobilinogen (UA POC): 0.2 (ref 0.2–1)
pH (UA POC): 6 (ref 4.6–8.0)

## 2018-06-01 MED ORDER — CEFTRIAXONE 1 GRAM SOLUTION FOR INJECTION
1 gram | Freq: Once | INTRAMUSCULAR | Status: AC
Start: 2018-06-01 — End: 2018-06-01
  Administered 2018-06-01: 21:00:00 via INTRAMUSCULAR

## 2018-06-01 MED ORDER — TRIMETHOPRIM-SULFAMETHOXAZOLE 160 MG-800 MG TAB
160-800 mg | ORAL_TABLET | Freq: Two times a day (BID) | ORAL | 0 refills | Status: AC
Start: 2018-06-01 — End: 2018-06-08

## 2018-06-01 NOTE — Progress Notes (Signed)
Rae Roam FNP  [place] [address]  Ph [phone] Fax [phone]    Cathy Jackson, DOB: July 21, 1946, AGE: 72 y.o.  Chief Complaint   Patient presents with   ??? Abdominal Pain     pt stated that sx's started saturday.     Visit Vitals  BP 120/70 (BP 1 Location: Left arm, BP Patient Position: Sitting)   Pulse 89   Temp 98 ??F (36.7 ??C) (Oral)   Resp 18   Ht 5\' 2"  (1.575 m)   Wt 122 lb (55.3 kg)   SpO2 97%   BMI 22.31 kg/m??     Social History     Tobacco Use   ??? Smoking status: Former Smoker     Packs/day: 0.50     Years: 20.00     Pack years: 10.00   ??? Smokeless tobacco: Never Used   ??? Tobacco comment: stopped at age 9   Substance Use Topics   ??? Alcohol use: No   ??? Drug use: No     Current Outpatient Medications   Medication Sig Dispense Refill   ??? trimethoprim-sulfamethoxazole (BACTRIM DS) 160-800 mg per tablet Take 1 Tab by mouth two (2) times a day for 7 days. 14 Tab 0   ??? PARoxetine (PAXIL) 20 mg tablet Take 1 Tab by mouth daily. 90 Tab 2   ??? simvastatin (ZOCOR) 20 mg tablet Take 1 Tab by mouth nightly. 90 Tab 2   ??? donepezil (ARICEPT) 10 mg tablet Take 1 Tab by mouth nightly. 90 Tab 2   ??? divalproex ER (DEPAKOTE ER) 500 mg ER tablet Take 500 mg by mouth.     ??? food supplemt, lactose-reduced (ENSURE ACTIVE CLEAR) liqd Take 237 mL by mouth four (4) times daily. 30 Can 3   ??? calcium carb-vitamin D3-vit K2 600 mg-1,000 unit-90 mcg tab Take  by mouth.     ??? cyanocobalamin 50 mcg tablet Take 50 mcg by mouth.     ??? famotidine (PEPCID) 20 mg tablet Take 20 mg by mouth.     ??? senna-docusate (PERICOLACE) 8.6-50 mg per tablet Take  by mouth.     ??? calcium carbonate (OS-CAL) 500 mg calcium (1,250 mg) tablet Take  by mouth daily.     ??? ascorbic acid, vitamin C, (VITAMIN C) 1,000 mg tablet Take  by mouth.     ??? ferrous sulfate (IRON) 325 mg (65 mg iron) tablet Take  by mouth Daily (before breakfast).       No Known Allergies  HPI    History of Present Illness  HPI  Reviewed pertinent past medical history, surgical history, social history, and family history as noted in patient record.     ROS  ROS  Physical Exam    Recent Results (from the past 8 hour(s))   AMB POC URINALYSIS DIP STICK AUTO W/O MICRO    Collection Time: 06/01/18  3:50 PM   Result Value Ref Range    Color (UA POC) Brown     Clarity (UA POC) Turbid     Glucose (UA POC) Negative Negative    Bilirubin (UA POC) Negative Negative    Ketones (UA POC) 1+ Negative    Specific gravity (UA POC) 1.020 1.001 - 1.035    Blood (UA POC) 4+ Negative    pH (UA POC) 6.0 4.6 - 8.0    Protein (UA POC) 3+ Negative  Urobilinogen (UA POC) 0.2 mg/dL 0.2 - 1    Nitrites (UA POC) Positive Negative    Leukocyte esterase (UA POC) 4+ Negative       Assessment & Plan  Diagnoses and all orders for this visit:    1. Acute cystitis with hematuria  -     CULTURE, URINE; Future  -     trimethoprim-sulfamethoxazole (BACTRIM DS) 160-800 mg per tablet; Take 1 Tab by mouth two (2) times a day for 7 days.    2. Abdominal pain, unspecified abdominal location  -     AMB POC URINALYSIS DIP STICK AUTO W/O MICRO  -     CULTURE, URINE; Future  -     trimethoprim-sulfamethoxazole (BACTRIM DS) 160-800 mg per tablet; Take 1 Tab by mouth two (2) times a day for 7 days.      Daughter will call IR and scheduled an appt to see if nephrostomy tube is correctly placed. There was allot of sediment in her urine.     Follow-up and Dispositions    ?? Return if symptoms worsen or fail to improve.

## 2018-06-01 NOTE — Progress Notes (Signed)
Spoke with pt's grand-daughter. She stated pt has completed the abx and is unsure if current dosing has cleared the infection and stated she feels pt may need another abx. Informed her that due to recent finishing of abx we will have to recheck the urine next week to evaluate if an infection is still present; pt's granddaughter stated that she will call back to schedule an appt. Ty

## 2018-06-01 NOTE — Progress Notes (Signed)
Please let Cathy Jackson know she did have bacteria growing in her urine. Is she feeling better. She needs to finish her antibiotic.

## 2018-06-01 NOTE — ACP (Advance Care Planning) (Signed)
Advance Care Planning (ACP)       Attempted to discuss ACP with Cathy Jackson today, she declines to discuss at this time.  Will readdress at future visit.

## 2018-06-02 ENCOUNTER — Encounter: Attending: Community Health | Primary: Community Health

## 2018-06-06 LAB — CULTURE, URINE

## 2018-06-08 ENCOUNTER — Encounter: Payer: MEDICARE | Attending: Urology | Primary: Community Health

## 2018-06-14 ENCOUNTER — Ambulatory Visit: Payer: Medicare PPO

## 2018-06-14 ENCOUNTER — Inpatient Hospital Stay: Admit: 2018-06-14 | Payer: MEDICARE | Attending: Community Health | Primary: Community Health

## 2018-06-14 ENCOUNTER — Encounter

## 2018-06-14 DIAGNOSIS — T83022A Displacement of nephrostomy catheter, initial encounter: Secondary | ICD-10-CM

## 2018-06-14 MED ORDER — LIDOCAINE HCL 2 % (20 MG/ML) IJ SOLN
20 mg/mL (2 %) | INTRAMUSCULAR | Status: AC
Start: 2018-06-14 — End: ?

## 2018-06-14 MED ORDER — MIDAZOLAM 1 MG/ML IJ SOLN
1 mg/mL | INTRAMUSCULAR | Status: DC | PRN
Start: 2018-06-14 — End: 2018-06-18
  Administered 2018-06-14 (×2): via INTRAVENOUS

## 2018-06-14 MED ORDER — FENTANYL CITRATE (PF) 50 MCG/ML IJ SOLN
50 mcg/mL | INTRAMUSCULAR | Status: DC | PRN
Start: 2018-06-14 — End: 2018-06-18
  Administered 2018-06-14: 17:00:00 via INTRAVENOUS

## 2018-06-14 MED ORDER — SODIUM CHLORIDE 0.9 % IV
INTRAVENOUS | Status: DC
Start: 2018-06-14 — End: 2018-06-15

## 2018-06-14 MED ORDER — SODIUM CHLORIDE 0.9 % IV
INTRAVENOUS | Status: DC
Start: 2018-06-14 — End: 2018-06-18
  Administered 2018-06-14: 17:00:00 via INTRAVENOUS

## 2018-06-14 MED ORDER — LIDOCAINE HCL 2 % (20 MG/ML) IJ SOLN
20 mg/mL (2 %) | Freq: Once | INTRAMUSCULAR | Status: DC
Start: 2018-06-14 — End: 2018-06-15

## 2018-06-14 MED ORDER — OXYCODONE-ACETAMINOPHEN 7.5 MG-325 MG TAB
ORAL | Status: DC | PRN
Start: 2018-06-14 — End: 2018-06-18

## 2018-06-14 MED ORDER — MIDAZOLAM 1 MG/ML IJ SOLN
1 mg/mL | INTRAMUSCULAR | Status: AC
Start: 2018-06-14 — End: ?

## 2018-06-14 MED ORDER — FENTANYL CITRATE (PF) 50 MCG/ML IJ SOLN
50 mcg/mL | INTRAMUSCULAR | Status: AC
Start: 2018-06-14 — End: ?

## 2018-06-14 MED ORDER — IOPAMIDOL 61 % IV SOLN
300 mg iodine /mL (61 %) | Freq: Once | INTRAVENOUS | Status: AC
Start: 2018-06-14 — End: 2018-06-14
  Administered 2018-06-14: 18:00:00 via INTRAVENOUS

## 2018-06-14 MED FILL — FENTANYL CITRATE (PF) 50 MCG/ML IJ SOLN: 50 mcg/mL | INTRAMUSCULAR | Qty: 2

## 2018-06-14 MED FILL — MIDAZOLAM 1 MG/ML IJ SOLN: 1 mg/mL | INTRAMUSCULAR | Qty: 4

## 2018-06-14 MED FILL — XYLOCAINE 20 MG/ML (2 %) INJECTION SOLUTION: 20 mg/mL (2 %) | INTRAMUSCULAR | Qty: 20

## 2018-06-14 NOTE — H&P (Signed)
Department of Interventional Radiology  [phone]    History and Physical    Patient:  Cathy Jackson MRN:  [id number]  SSN:  [id number]    Date of Birth:  05/26/1946  Age:  72 y.o.  Sex:  female      Primary Care Provider:  Wilfred Lacy, NP  Referring Physician:  Vladimir Creeks, MD    Subjective:     Chief Complaint: drain exchange    History of the Present Illness:  The patient is a 72 y.o. female who presents for nephrostogram and exchange of her nephroureteral drain.  Bladder cancer.  Stenosis at ureteral anastomosis.  Recently completed course of antibiotics for UTI. No pain or fevers.         Past Medical History:   Diagnosis Date   ??? Abdominal pain, unspecified site 10/27/2012   ??? Arthritis     generalized   ??? Asthma 10/27/2012   ??? Bladder cancer ([place])    ??? Depression     managed with medication    ??? Dysuria 10/27/2012   ??? Environmental allergies     managed with medication    ??? GERD (gastroesophageal reflux disease) 10/27/2012   ??? History of kidney stones     states surgical removed   ??? Hypothyroidism     managed with medication    ??? Other "heavy-for-dates" infants     ureterectomy and ileal conduit   ??? Unspecified disorder of bladder 10/27/2012   ??? Urethral caruncle 10/27/2012   ??? Urinary frequency 10/27/2012   ??? Urinary tract infection, site not specified 10/27/2012     Past Surgical History:   Procedure Laterality Date   ??? CLOSE CYSTOSTOMY      cystoscopy/ turbt x 2   ??? HX BREAST BIOPSY Left 2000    x 2   ??? HX CESAREAN SECTION      x 1   ??? HX CHOLECYSTECTOMY     ??? HX NEPHROSTOMY  2014   ??? HX TUBAL LIGATION     ??? HX WRIST FRACTURE TX Left    ??? IR CHANGE INTERNAL URETERAL STENT SI  02/15/2018   ??? IR CHANGE INTERNAL URETERAL STENT SI  04/15/2018   ??? IR CONVERT NEPHRO PERC RT TO NEPHROURETERAL CATH EXISTING SI  11/12/2017   ??? IR EXCHANGE NEPHRO PERC RT SI  10/23/2017        Review of Systems:    Pertinent items are noted in the History of Present Illness.    Prior to Admission medications     Medication Sig Start Date End Date Taking? Authorizing Provider   PARoxetine (PAXIL) 20 mg tablet Take 1 Tab by mouth daily. 04/20/18  Yes Rae Roam F, NP   simvastatin (ZOCOR) 20 mg tablet Take 1 Tab by mouth nightly. 04/20/18  Yes Rae Roam F, NP   donepezil (ARICEPT) 10 mg tablet Take 1 Tab by mouth nightly. 04/20/18  Yes Rae Roam F, NP   divalproex ER (DEPAKOTE ER) 500 mg ER tablet Take 500 mg by mouth. 04/06/18 04/06/19 Yes Provider, Historical   food supplemt, lactose-reduced (ENSURE ACTIVE CLEAR) liqd Take 237 mL by mouth four (4) times daily. 04/01/18  Yes Morene Crocker, MD   calcium carb-vitamin D3-vit K2 600 mg-1,000 unit-90 mcg tab Take  by mouth.   Yes Provider, Historical   cyanocobalamin 50 mcg tablet Take 50 mcg by mouth.   Yes Provider, Historical   famotidine (PEPCID) 20 mg tablet Take 20 mg  by mouth.   Yes Provider, Historical   senna-docusate (PERICOLACE) 8.6-50 mg per tablet Take  by mouth.   Yes Provider, Historical   calcium carbonate (OS-CAL) 500 mg calcium (1,250 mg) tablet Take  by mouth daily.   Yes Provider, Historical   ascorbic acid, vitamin C, (VITAMIN C) 1,000 mg tablet Take  by mouth.   Yes Provider, Historical   ferrous sulfate (IRON) 325 mg (65 mg iron) tablet Take  by mouth Daily (before breakfast).   Yes Provider, Historical        No Known Allergies    Family History   Problem Relation Age of Onset   ??? Stroke Mother    ??? Heart Disease Mother    ??? Cancer Father    ??? Asthma Father    ??? Lung Disease Father         COPD/smoker   ??? Cancer Brother    ??? Heart Disease Brother    ??? Alcohol abuse Brother      Social History     Tobacco Use   ??? Smoking status: Former Smoker     Packs/day: 0.50     Years: 20.00     Pack years: 10.00   ??? Smokeless tobacco: Never Used   ??? Tobacco comment: stopped at age 81   Substance Use Topics   ??? Alcohol use: No        Objective:       Physical Examination:    Vitals:    06/14/18 1207   BP: 134/72   Pulse: 93   Resp: 16   Temp: 97.9 ??F (36.6 ??C)    SpO2: 95%       Pain Assessment  Pain Intensity 1: 0 (06/14/18 1218)      pain goal 0         HEART: regular rate and rhythm  LUNG: clear to auscultation bilaterally  ABDOMEN:  soft  EXTREMITIES: warm, no edema    Laboratory:     Lab Results   Component Value Date/Time    Sodium 137 04/15/2018 10:06 AM    Sodium 139 03/23/2018 01:16 PM    Potassium 3.4 (L) 04/15/2018 10:06 AM    Potassium 4.5 03/23/2018 01:16 PM    Chloride 104 04/15/2018 10:06 AM    Chloride 103 03/23/2018 01:16 PM    CO2 28 04/15/2018 10:06 AM    CO2 28 03/23/2018 01:16 PM    Anion gap 5 (L) 04/15/2018 10:06 AM    Anion gap 8 03/23/2018 01:16 PM    Glucose 142 (H) 04/15/2018 10:06 AM    Glucose 121 (H) 03/23/2018 01:16 PM    BUN 17 04/15/2018 10:06 AM    BUN 12 03/23/2018 01:16 PM    Creatinine 1.10 (H) 04/15/2018 10:06 AM    Creatinine 0.90 03/23/2018 01:16 PM    GFR est AA >60 04/15/2018 10:06 AM    GFR est AA >60 03/23/2018 01:16 PM    GFR est non-AA 52 (L) 04/15/2018 10:06 AM    GFR est non-AA >60 03/23/2018 01:16 PM    Calcium 7.8 (L) 04/15/2018 10:06 AM    Calcium 9.2 03/23/2018 01:16 PM    Magnesium 1.8 10/20/2012 05:38 AM    Magnesium 1.8 10/18/2012 05:47 AM    Albumin 2.5 (L) 04/15/2018 10:06 AM    Albumin 4.1 03/01/2018 08:57 AM    Protein, total 6.5 04/15/2018 10:06 AM    Protein, total 6.8 03/01/2018 08:57 AM    Globulin 4.0 (H)  04/15/2018 10:06 AM    Globulin 4.3 (H) 10/29/2017 11:25 PM    A-G Ratio 0.6 (L) 04/15/2018 10:06 AM    A-G Ratio 1.5 03/01/2018 08:57 AM    AST (SGOT) 10 (L) 04/15/2018 10:06 AM    AST (SGOT) 10 03/01/2018 08:57 AM    ALT (SGPT) 7 (L) 04/15/2018 10:06 AM    ALT (SGPT) 7 03/01/2018 08:57 AM     Lab Results   Component Value Date/Time    WBC 13.1 (H) 04/15/2018 10:06 AM    WBC 10.4 03/23/2018 01:16 PM    HGB 11.3 (L) 04/15/2018 10:06 AM    HGB 15.7 (H) 03/23/2018 01:16 PM    HCT 36.7 04/15/2018 10:06 AM    HCT 48.9 (H) 03/23/2018 01:16 PM    PLATELET 216 04/15/2018 10:06 AM    PLATELET 326 03/23/2018 01:16 PM      Lab Results   Component Value Date/Time    aPTT 25.7 10/11/2012 03:40 PM    Prothrombin time 10.6 10/11/2012 03:40 PM    INR 1.0 10/11/2012 03:40 PM       Assessment:     Bladder cancer, ureteral stenosis    Hospital Problems  Date Reviewed: 06/01/2018    None          Plan:     Planned Procedure:  Retrograde nephroureteral drain exchange.     Risks, benefits, and alternatives reviewed with patient and she agrees to proceed with the procedure.      Signed By: Oval Linsey, PA-C     June 14, 2018

## 2018-06-14 NOTE — Procedures (Signed)
Department of Interventional Radiology  [phone]        Interventional Radiology Brief Procedure Note    Patient: Cathy Jackson MRN: [id number]  SSN: [id number]    Date of Birth: 09-Sep-1945  Age: 72 y.o.  Sex: female      Date of Procedure: 06/14/2018    Pre-Procedure Diagnosis: Chronic retrograde nephro-ureteral drain.  Occasional abdominal pain.      Post-Procedure Diagnosis: SAME    Procedure(s): Percutaneous retrograde Nephro-ureteral drain Exchange    Brief Description of Procedure: as above    Performed By: Vladimir Creeks, MD     Assistants: None    Anesthesia:Moderate Sedation    Estimated Blood Loss: None    Specimens:  None    Implants:  All Purpose Drain 12 Fr    Findings: Corkscrew right ureter.  Mild hydronephrosis.      Complications: None    Recommendations: 1 hour bedrest and then discharge home.  Return in 3 months.  No IV sedation at the next visit.       Follow Up: 3 months.     Signed By: Vladimir Creeks, MD     June 14, 2018

## 2018-06-23 ENCOUNTER — Ambulatory Visit: Admit: 2018-06-23 | Discharge: 2018-06-23 | Payer: MEDICARE | Attending: Adult Health | Primary: Community Health

## 2018-06-23 DIAGNOSIS — C679 Malignant neoplasm of bladder, unspecified: Secondary | ICD-10-CM

## 2018-06-23 MED ORDER — METHENAMINE HIPPURATE 1 GRAM TAB
1 gram | ORAL_TABLET | Freq: Two times a day (BID) | ORAL | 6 refills | Status: DC
Start: 2018-06-23 — End: 2018-06-24

## 2018-06-23 MED ORDER — DEXTROMETHORPHAN POLY COMPLEX SR 30 MG/5 ML ORAL 12 HR SUSP
30 mg/5 mL | Freq: Two times a day (BID) | ORAL | 0 refills | Status: DC
Start: 2018-06-23 — End: 2018-06-24

## 2018-06-23 MED ORDER — METHYLPREDNISOLONE 4 MG TABS IN A DOSE PACK
4 mg | ORAL | 0 refills | Status: DC
Start: 2018-06-23 — End: 2018-06-24

## 2018-06-23 NOTE — Progress Notes (Signed)
[facility]  [address] [place] [address]  [phone]    Cathy Jackson  DOB: 1946/01/06    Chief Complaint   Patient presents with   ??? Follow-up   ??? Bladder Cancer          HPI     Cathy Jackson is a 72 y.o. female    follow up for CaB. S/P anterior pelvic exonteration, B PLND and ileal conduit urinary diversion on 10/13/12. Final path was TCC T1G3NO with neg margins.?? Diagnosed with R ureteroenteric stricture and now has R nephroureteral stent placed by IR on 11/12/17.  Last CT on 02/09/17 showed a stable ventral hernia with a R distal ureteral stone.  R URS performed by Dr[facility] Marina Gravel at [facility] which was neg for stones following CT.  Denies any recent infections or wt loss.  Cr was 0.93 on 10/29/17.    Overall doing well.  She is pleasantly demented today.  She has a cough and is concerned about that.  Her caregiver tells me that every time she is seen by PCP they give her abx due to "really bad urinary infection."  I explained to the caregiver and the patient that if they feel an infection is present symptoms will be fever chills fatigue and worsening altered mental status.  Please call me and let me check a urine.  I fear if she continues to get antibiotics from her primary care physician that they are going to build up an antibiotic resistance.      Past Medical History:   Diagnosis Date   ??? Abdominal pain, unspecified site 10/27/2012   ??? Arthritis     generalized   ??? Asthma 10/27/2012   ??? Bladder cancer ([place])    ??? Depression     managed with medication    ??? Dysuria 10/27/2012   ??? Environmental allergies     managed with medication    ??? GERD (gastroesophageal reflux disease) 10/27/2012   ??? History of kidney stones     states surgical removed   ??? Hypothyroidism     managed with medication    ??? Other "heavy-for-dates" infants     ureterectomy and ileal conduit   ??? Unspecified disorder of bladder 10/27/2012   ??? Urethral caruncle 10/27/2012   ??? Urinary frequency 10/27/2012    ??? Urinary tract infection, site not specified 10/27/2012     Past Surgical History:   Procedure Laterality Date   ??? CLOSE CYSTOSTOMY      cystoscopy/ turbt x 2   ??? HX BREAST BIOPSY Left 2000    x 2   ??? HX CESAREAN SECTION      x 1   ??? HX CHOLECYSTECTOMY     ??? HX NEPHROSTOMY  2014   ??? HX TUBAL LIGATION     ??? HX WRIST FRACTURE TX Left    ??? IR CHANGE INTERNAL URETERAL STENT SI  02/15/2018   ??? IR CHANGE INTERNAL URETERAL STENT SI  04/15/2018   ??? IR CHANGE INTERNAL URETERAL STENT SI  06/14/2018   ??? IR CONVERT NEPHRO PERC RT TO NEPHROURETERAL CATH EXISTING SI  11/12/2017   ??? IR EXCHANGE NEPHRO PERC RT SI  10/23/2017     Current Outpatient Medications   Medication Sig Dispense Refill   ??? methylPREDNISolone (MEDROL DOSEPACK) 4 mg tablet Take a directed. 1 Dose Pack 0   ??? methenamine hippurate (HIPREX) 1 gram tablet Take 1 Tab by mouth two (2) times daily (with  meals). 60 Tab 6   ??? dextromethorphan (DELSYM) 30 mg/5 mL liquid Take 10 mL by mouth two (2) times a day for 10 days. 200 mL 0   ??? PARoxetine (PAXIL) 20 mg tablet Take 1 Tab by mouth daily. 90 Tab 2   ??? donepezil (ARICEPT) 10 mg tablet Take 1 Tab by mouth nightly. 90 Tab 2   ??? divalproex ER (DEPAKOTE ER) 500 mg ER tablet Take 500 mg by mouth.     ??? food supplemt, lactose-reduced (ENSURE ACTIVE CLEAR) liqd Take 237 mL by mouth four (4) times daily. 30 Can 3   ??? calcium carb-vitamin D3-vit K2 600 mg-1,000 unit-90 mcg tab Take  by mouth.     ??? famotidine (PEPCID) 20 mg tablet Take 20 mg by mouth.     ??? senna-docusate (PERICOLACE) 8.6-50 mg per tablet Take  by mouth.     ??? calcium carbonate (OS-CAL) 500 mg calcium (1,250 mg) tablet Take  by mouth daily.     ??? ascorbic acid, vitamin C, (VITAMIN C) 1,000 mg tablet Take  by mouth.     ??? ferrous sulfate (IRON) 325 mg (65 mg iron) tablet Take  by mouth Daily (before breakfast).       No Known Allergies  Social History     Socioeconomic History   ??? Marital status: WIDOWED     Spouse name: Not on file    ??? Number of children: Not on file   ??? Years of education: Not on file   ??? Highest education level: Not on file   Occupational History   ??? Not on file   Social Needs   ??? Financial resource strain: Not on file   ??? Food insecurity:     Worry: Not on file     Inability: Not on file   ??? Transportation needs:     Medical: Not on file     Non-medical: Not on file   Tobacco Use   ??? Smoking status: Former Smoker     Packs/day: 0.50     Years: 20.00     Pack years: 10.00   ??? Smokeless tobacco: Never Used   ??? Tobacco comment: stopped at age 48   Substance and Sexual Activity   ??? Alcohol use: No   ??? Drug use: No   ??? Sexual activity: Never   Lifestyle   ??? Physical activity:     Days per week: Not on file     Minutes per session: Not on file   ??? Stress: Not on file   Relationships   ??? Social connections:     Talks on phone: Not on file     Gets together: Not on file     Attends religious service: Not on file     Active member of club or organization: Not on file     Attends meetings of clubs or organizations: Not on file     Relationship status: Not on file   ??? Intimate partner violence:     Fear of current or ex partner: Not on file     Emotionally abused: Not on file     Physically abused: Not on file     Forced sexual activity: Not on file   Other Topics Concern   ??? Not on file   Social History Narrative   ??? Not on file     Family History   Problem Relation Age of Onset   ??? Stroke Mother    ???  Heart Disease Mother    ??? Cancer Father    ??? Asthma Father    ??? Lung Disease Father         COPD/smoker   ??? Cancer Brother    ??? Heart Disease Brother    ??? Alcohol abuse Brother        Review of Systems  Constitutional:   Negative for fever.  GI:  Negative for nausea.  Musculoskeletal:  Negative for back pain.    PHYSICAL EXAM    Visit Vitals  BP 118/62   Pulse 88   Temp 97.2 ??F (36.2 ??C) (Oral)   Ht 5\' 2"  (1.575 m)   Wt 122 lb 9.6 oz (55.6 kg)   LMP  (LMP Unknown)   BMI 22.42 kg/m??        General appearance - alert, well appearing, and in no distress  Mental status - alert, oriented to person,   Chest - decreased to BLL.no wheezes, productive cough.  Heart - normal rate, regular rhythm.  Abdomen - soft, nontender, nondistended, no masses or organomegaly  GU -stoma to the right lower quadrant of the abdomen is pink urine is clear.  Stent is present.  Musculoskeletal - no joint tenderness, deformity or swelling  Extremities - MAEW, peripheral pulses normal  Skin - normal coloration and turgor, no rashes      Assessment and Plan    ICD-10-CM ICD-9-CM    1. Malignant neoplasm of urinary bladder, unspecified site (HCC) C67.9 188.9    2. Ureteral stricture, right N13.5 593.3    3. Cough R05 786.2      PLAN:  I will send methenamine to the pharmacy they can use twice a day for urine  Medrol Dosepak was sent to the pharmacy.  Delsym cough syrup can be used to suppress cough  If cough gets worse or she is no better next week she will need to see her PCP.    Curlene Labrum, NP  Dr. Queen Slough is supervising physician today and he approves plan of care.    Follow-up and Dispositions    ?? Return in about 6 months (around 12/23/2018) for for recheck.       Elements of this note have been dictated using speech recognition software.  Although reviewed, errors of speech recognition may have occurred.

## 2018-06-23 NOTE — Addendum Note (Signed)
Addended by: Frances Nickels A on: 06/24/2018 11:28 AM     Modules accepted: Orders

## 2018-06-24 MED ORDER — METHYLPREDNISOLONE 4 MG TABS IN A DOSE PACK
4 mg | ORAL | 0 refills | Status: DC
Start: 2018-06-24 — End: 2018-07-05

## 2018-06-24 MED ORDER — DEXTROMETHORPHAN POLY COMPLEX SR 30 MG/5 ML ORAL 12 HR SUSP
30 mg/5 mL | Freq: Two times a day (BID) | ORAL | 0 refills | Status: AC
Start: 2018-06-24 — End: 2018-07-04

## 2018-06-24 MED ORDER — METHENAMINE HIPPURATE 1 GRAM TAB
1 gram | ORAL_TABLET | Freq: Two times a day (BID) | ORAL | 6 refills | Status: DC
Start: 2018-06-24 — End: 2018-07-12

## 2018-07-05 NOTE — Progress Notes (Signed)
Spoke with pts daughter (pt has dementia) concerning appt. Pt rescheduled for Jan.at daughters request.

## 2018-07-06 ENCOUNTER — Other Ambulatory Visit: Payer: Self-pay | Admitting: *Deleted

## 2018-07-06 ENCOUNTER — Inpatient Hospital Stay: Payer: MEDICARE | Attending: Community Health | Primary: Community Health

## 2018-07-06 MED ORDER — DONEPEZIL HCL 5 MG PO TABS: 5.0000 mg | ORAL_TABLET | Freq: Every day | ORAL | 1 refills | Status: DC

## 2018-07-12 MED ORDER — METHENAMINE HIPPURATE 1 GRAM TAB
1 gram | ORAL_TABLET | Freq: Two times a day (BID) | ORAL | 6 refills | Status: DC
Start: 2018-07-12 — End: 2019-04-05

## 2018-07-12 NOTE — Telephone Encounter (Signed)
[facility] called wanting a 90day supply. Information sent

## 2018-07-14 ENCOUNTER — Encounter: Attending: Community Health | Primary: Community Health

## 2018-07-15 ENCOUNTER — Ambulatory Visit: Payer: MEDICARE | Primary: Community Health

## 2018-08-10 ENCOUNTER — Other Ambulatory Visit: Payer: Self-pay | Admitting: Family Medicine

## 2018-08-23 ENCOUNTER — Other Ambulatory Visit (HOSPITAL_COMMUNITY): Payer: Self-pay | Admitting: Family Medicine

## 2018-08-23 DIAGNOSIS — Z1231 Encounter for screening mammogram for malignant neoplasm of breast: Secondary | ICD-10-CM

## 2018-08-30 ENCOUNTER — Ambulatory Visit (HOSPITAL_COMMUNITY)
Admission: RE | Admit: 2018-08-30 | Discharge: 2018-08-30 | Disposition: A | Discharge status: Home / Self Care | Payer: Medicare PPO | Source: Ambulatory Visit | Attending: Family Medicine | Admitting: Family Medicine

## 2018-08-30 ENCOUNTER — Inpatient Hospital Stay: Payer: MEDICARE | Attending: Community Health | Primary: Community Health

## 2018-08-30 DIAGNOSIS — Z1231 Encounter for screening mammogram for malignant neoplasm of breast: Secondary | ICD-10-CM | POA: Insufficient documentation

## 2018-09-07 ENCOUNTER — Encounter

## 2018-09-07 ENCOUNTER — Inpatient Hospital Stay: Admit: 2018-09-07 | Payer: MEDICARE | Attending: Community Health | Primary: Community Health

## 2018-09-07 DIAGNOSIS — T83022A Displacement of nephrostomy catheter, initial encounter: Secondary | ICD-10-CM

## 2018-09-07 MED ORDER — IOPAMIDOL 61 % IV SOLN
300 mg iodine /mL (61 %) | Freq: Once | INTRAVENOUS | Status: AC
Start: 2018-09-07 — End: 2018-09-07
  Administered 2018-09-07: 19:00:00

## 2018-09-07 MED ORDER — LIDOCAINE 2 % MUCOSAL SOLN
2 % | Status: AC
Start: 2018-09-07 — End: ?

## 2018-09-07 MED ORDER — DIAZEPAM 5 MG TAB
5 mg | ORAL | Status: AC
Start: 2018-09-07 — End: ?

## 2018-09-07 MED ORDER — DIAZEPAM 5 MG TAB
5 mg | Freq: Once | ORAL | Status: AC
Start: 2018-09-07 — End: 2018-09-07
  Administered 2018-09-07: 18:00:00 via ORAL

## 2018-09-07 MED ORDER — IOPAMIDOL 61 % IV SOLN
61 % | Freq: Once | INTRAVENOUS | Status: DC
Start: 2018-09-07 — End: 2018-09-07

## 2018-09-07 MED FILL — DIAZEPAM 5 MG TAB: 5 mg | ORAL | Qty: 2

## 2018-09-07 MED FILL — LIDOCAINE 2 % MUCOSAL SOLN: 2 % | Qty: 15

## 2018-09-07 NOTE — Op Note (Signed)
Home with family at1350.  Bag intact over drain.  Denies pain

## 2018-09-07 NOTE — Procedures (Signed)
Department of Interventional Radiology  [phone]        Interventional Radiology Brief Procedure Note    Patient: Cathy Jackson MRN: [id number]  SSN: [id number]    Date of Birth: 05-Nov-1945  Age: 73 y.o.  Sex: female      Date of Procedure: 09/07/2018    Pre-Procedure Diagnosis: bladder cancer.  Routine retrograde nephu exchange.      Post-Procedure Diagnosis: SAME    Procedure(s): retrograde nephroureteral exchange    Brief Description of Procedure: as above    Performed By: Vladimir Creeks, MD     Assistants: None    Anesthesia:None    Estimated Blood Loss: None    Specimens:  None    Implants: nephro Ureteral Stent    Findings: no hydro    Complications: None    Recommendations: discharge home     Follow Up: retrurn in 3-4 months    Signed By: Vladimir Creeks, MD     September 07, 2018

## 2018-09-07 NOTE — Progress Notes (Signed)
Prep complete.

## 2018-09-07 NOTE — Progress Notes (Addendum)
Patient's daughter Earnest Bailey has already given the patient her pain pill for the procedure.

## 2018-09-08 DIAGNOSIS — E663 Overweight: Secondary | ICD-10-CM | POA: Diagnosis not present

## 2018-09-08 DIAGNOSIS — H269 Unspecified cataract: Secondary | ICD-10-CM | POA: Diagnosis not present

## 2018-09-08 DIAGNOSIS — I10 Essential (primary) hypertension: Secondary | ICD-10-CM | POA: Diagnosis not present

## 2018-09-08 DIAGNOSIS — R413 Other amnesia: Secondary | ICD-10-CM | POA: Diagnosis not present

## 2018-09-08 DIAGNOSIS — E785 Hyperlipidemia, unspecified: Secondary | ICD-10-CM | POA: Diagnosis not present

## 2018-09-08 DIAGNOSIS — Z6827 Body mass index (BMI) 27.0-27.9, adult: Secondary | ICD-10-CM | POA: Diagnosis not present

## 2018-09-08 DIAGNOSIS — Z86011 Personal history of benign neoplasm of the brain: Secondary | ICD-10-CM | POA: Diagnosis not present

## 2018-09-08 DIAGNOSIS — H547 Unspecified visual loss: Secondary | ICD-10-CM | POA: Diagnosis not present

## 2018-09-08 DIAGNOSIS — G40909 Epilepsy, unspecified, not intractable, without status epilepticus: Secondary | ICD-10-CM | POA: Diagnosis not present

## 2018-09-21 ENCOUNTER — Encounter: Payer: Self-pay | Admitting: Family Medicine

## 2018-09-21 ENCOUNTER — Ambulatory Visit (INDEPENDENT_AMBULATORY_CARE_PROVIDER_SITE_OTHER): Payer: 59 | Admitting: Family Medicine

## 2018-09-21 ENCOUNTER — Other Ambulatory Visit: Payer: Self-pay

## 2018-09-21 VITALS — BP 124/66 | HR 56 | Temp 97.7°F | Resp 14 | Ht 67.0 in | Wt 184.0 lb

## 2018-09-21 DIAGNOSIS — H409 Unspecified glaucoma: Secondary | ICD-10-CM

## 2018-09-21 DIAGNOSIS — Z9889 Other specified postprocedural states: Secondary | ICD-10-CM | POA: Diagnosis not present

## 2018-09-21 DIAGNOSIS — Z789 Other specified health status: Secondary | ICD-10-CM | POA: Diagnosis not present

## 2018-09-21 DIAGNOSIS — Z0000 Encounter for general adult medical examination without abnormal findings: Secondary | ICD-10-CM | POA: Diagnosis not present

## 2018-09-21 DIAGNOSIS — I10 Essential (primary) hypertension: Secondary | ICD-10-CM | POA: Diagnosis not present

## 2018-09-21 DIAGNOSIS — R4189 Other symptoms and signs involving cognitive functions and awareness: Secondary | ICD-10-CM | POA: Diagnosis not present

## 2018-09-21 DIAGNOSIS — Z8673 Personal history of transient ischemic attack (TIA), and cerebral infarction without residual deficits: Secondary | ICD-10-CM

## 2018-09-21 DIAGNOSIS — E7800 Pure hypercholesterolemia, unspecified: Secondary | ICD-10-CM | POA: Diagnosis not present

## 2018-09-21 NOTE — Patient Instructions (Addendum)
F/U 6 months  We will call with lab results  

## 2018-09-21 NOTE — Progress Notes (Signed)
Subjective:   Patient presents for Medicare Annual/Subsequent preventive examination.    Has joint pain, usually at night, takes a motrin or tylenol  helps her knees/legs    HTN- taking bp as prescribed, no concerns   Hyperlipidemia- taking lipitor at bedtime    She has hd botox for drooping eyelids but vision is good.  Review Past Medical/Family/Social: per EMR  Risk Factors  Current exercise habits: walks  Dietary issues discussed: Yes  Cardiac risk factors: Stroke, HTN, Hyperliopidemia   Depression Screen  (Note: if answer to either of the following is "Yes", a more complete depression screening is indicated)  Over the past two weeks, have you felt down, depressed or hopeless? No Over the past two weeks, have you felt little interest or pleasure in doing things? No Have you lost interest or pleasure in daily life? No Do you often feel hopeless? No Do you cry easily over simple problems? No   Activities of Daily Living  In your present state of health, do you have any difficulty performing the following activities?:  Driving? No  Managing money? No  Feeding yourself? No  Getting from bed to chair? No  Climbing a flight of stairs? No  Preparing food and eating?: No  Bathing or showering? No  Getting dressed: No  Getting to the toilet? No  Using the toilet:No  Moving around from place to place: No  In the past year have you fallen or had a near fall?:No  Are you sexually active? No  Do you have more than one partner? No   Hearing Difficulties: No  Do you often ask people to speak up or repeat themselves? No  Do you experience ringing or noises in your ears? No Do you have difficulty understanding soft or whispered voices? No  Do you feel that you have a problem with memory? No Do you often misplace items? No  Do you feel safe at home? Yes  Cognitive Testing  Alert? Yes Normal Appearance?Yes  Oriented to person? Yes Place? Yes  Time? Yes  Recall of three objects?  Yes  Can perform simple calculations? Yes  Displays appropriate judgment?Yes  Can read the correct time from a watch face?Yes   List the Names of Other Physician/Practitioners you currently use:   Ophthalmology - [facility] , [facility] , podiatry    Screening Tests / Date Colonoscopy     UTD                 Zostavax Declined  Mammogram UTD 2017  Influenza Vaccine Due Tetanus/tdap UTD Pneumonia- UTD  ROS: GEN- denies fatigue, fever, weight loss,weakness, recent illness HEENT- denies eye drainage, change in vision, nasal discharge, CVS- denies chest pain, palpitations RESP- denies SOB, cough, wheeze ABD- denies N/V, change in stools, abd pain GU- denies dysuria, hematuria, dribbling, incontinence MSK- denies joint pain, muscle aches, injury Neuro- denies headache, dizziness, syncope, seizure activity  PHYSICAL: GEN- NAD, alert and oriented x3,vitals reviewed  HEENT- PERRL, EOMI, non injected sclera, pink conjunctiva, MMM, oropharynx clear,  old craniomtomy scar in tact no significant swelling around , has mild drooping eyelids  Neck- Supple, no thryomegaly, no JVD CVS- RRR, no murmur RESP-CTAB ABD-NABS,soft,NT,ND EXT- pedal edema Pulses- Radial, DP- 2+  Assessment:    Annual wellness medicare exam   Plan:    During the course of the visit the patient was educated and counseled about appropriate screening and preventive services including:  Audit C/Fall/Depression - negative  S/P craniotomy- taking  keppra    HTN- well controlled     Glaucoma- per eye doctor , note she does not want surgical intervention for her drooping eyelids     Hyperluiouidemua - taking statin drug  Recheck lipids and LFT today    Full Code, but does not want prolonged life saving measures on ventilator      Diet review for nutrition referral? Yes ____ Not Indicated __x__  Patient Instructions (the written plan) was given to the patient.  Medicare Attestation  I have personally  reviewed:  The patient's medical and social history  Their use of alcohol, tobacco or illicit drugs  Their current medications and supplements  The patient's functional ability including ADLs,fall risks, home safety risks, cognitive, and hearing and visual impairment  Diet and physical activities  Evidence for depression or mood disorders  The patient's weight, height, BMI, and visual acuity have been recorded in the chart. I have made referrals, counseling, and provided education to the patient based on review of the above and I have provided the patient with a written personalized care plan for preventive services.

## 2018-09-22 ENCOUNTER — Encounter: Payer: Self-pay | Admitting: *Deleted

## 2018-09-22 LAB — COMPREHENSIVE METABOLIC PANEL
AG Ratio: 1.3 (calc) (ref 1.0–2.5)
ALT: 16 U/L (ref 6–29)
AST: 17 U/L (ref 10–35)
Albumin: 4.1 g/dL (ref 3.6–5.1)
Alkaline phosphatase (APISO): 76 U/L (ref 33–130)
BUN: 10 mg/dL (ref 7–25)
CO2: 30 mmol/L (ref 20–32)
Calcium: 9.3 mg/dL (ref 8.6–10.4)
Chloride: 102 mmol/L (ref 98–110)
Creat: 0.81 mg/dL (ref 0.60–0.93)
GLUCOSE: 76 mg/dL (ref 65–99)
Globulin: 3.1 g/dL (calc) (ref 1.9–3.7)
Potassium: 4.4 mmol/L (ref 3.5–5.3)
Sodium: 141 mmol/L (ref 135–146)
Total Bilirubin: 0.6 mg/dL (ref 0.2–1.2)
Total Protein: 7.2 g/dL (ref 6.1–8.1)

## 2018-09-22 LAB — LIPID PANEL
CHOLESTEROL: 177 mg/dL (ref <200)
HDL: 71 mg/dL (ref >50)
LDL Cholesterol (Calc): 80 mg/dL (calc)
Non-HDL Cholesterol (Calc): 106 mg/dL (calc) (ref <130)
Total CHOL/HDL Ratio: 2.5 (calc) (ref <5.0)
Triglycerides: 165 mg/dL — ABNORMAL HIGH (ref <150)

## 2018-09-22 LAB — CBC WITH DIFFERENTIAL/PLATELET
ABSOLUTE MONOCYTES: 716 {cells}/uL (ref 200–950)
Basophils Absolute: 58 cells/uL (ref 0–200)
Basophils Relative: 1.1 %
Eosinophils Absolute: 90 cells/uL (ref 15–500)
Eosinophils Relative: 1.7 %
HCT: 42.8 % (ref 35.0–45.0)
Hemoglobin: 14.7 g/dL (ref 11.7–15.5)
LYMPHS ABS: 1781 {cells}/uL (ref 850–3900)
MCH: 30.9 pg (ref 27.0–33.0)
MCHC: 34.3 g/dL (ref 32.0–36.0)
MCV: 89.9 fL (ref 80.0–100.0)
MPV: 11.2 fL (ref 7.5–12.5)
Monocytes Relative: 13.5 %
Neutro Abs: 2655 cells/uL (ref 1500–7800)
Neutrophils Relative %: 50.1 %
Platelets: 165 10*3/uL (ref 140–400)
RBC: 4.76 10*6/uL (ref 3.80–5.10)
RDW: 13.3 % (ref 11.0–15.0)
Total Lymphocyte: 33.6 %
WBC: 5.3 10*3/uL (ref 3.8–10.8)

## 2018-11-23 ENCOUNTER — Other Ambulatory Visit: Payer: Self-pay | Admitting: *Deleted

## 2018-11-23 MED ORDER — DONEPEZIL HCL 5 MG PO TABS: 5.0000 mg | ORAL_TABLET | Freq: Every day | ORAL | 1 refills | Status: DC

## 2018-12-20 ENCOUNTER — Encounter

## 2018-12-20 ENCOUNTER — Inpatient Hospital Stay: Admit: 2018-12-20 | Payer: MEDICARE | Attending: Diagnostic Radiology | Primary: Community Health

## 2018-12-20 DIAGNOSIS — N135 Crossing vessel and stricture of ureter without hydronephrosis: Secondary | ICD-10-CM

## 2018-12-20 MED ORDER — LIDOCAINE 2 % MUCOSAL SOLN
2 % | Status: AC
Start: 2018-12-20 — End: ?

## 2018-12-20 MED ORDER — LIDOCAINE HCL 2 % (20 MG/ML) IJ SOLN
20 mg/mL (2 %) | Freq: Once | INTRAMUSCULAR | Status: DC
Start: 2018-12-20 — End: 2018-12-21

## 2018-12-20 MED ORDER — LIDOCAINE HCL 2 % (20 MG/ML) IJ SOLN
20 mg/mL (2 %) | INTRAMUSCULAR | Status: AC
Start: 2018-12-20 — End: ?

## 2018-12-20 MED ORDER — LIDOCAINE 2 % MUCOSAL SOLN
2 % | Status: DC | PRN
Start: 2018-12-20 — End: 2018-12-24
  Administered 2018-12-20: 17:00:00 via OROMUCOSAL

## 2018-12-20 MED ORDER — IOPAMIDOL 61 % IV SOLN
61 % | Freq: Once | INTRAVENOUS | Status: AC
Start: 2018-12-20 — End: 2018-12-20
  Administered 2018-12-20: 17:00:00

## 2018-12-20 MED FILL — LIDOCAINE 2 % MUCOSAL SOLN: 2 % | Qty: 15

## 2018-12-20 MED FILL — XYLOCAINE 20 MG/ML (2 %) INJECTION SOLUTION: 20 mg/mL (2 %) | INTRAMUSCULAR | Qty: 20

## 2018-12-20 NOTE — Progress Notes (Signed)
Patient arrived to IR procedure room 2.

## 2018-12-20 NOTE — Progress Notes (Signed)
Recovery period without difficulty. Pt alert and oriented and denies pain. Dressing is clean, dry, and intact. Reviewed discharge instructions with patient and daughter, both verbalized understanding. Pt escorted to lobby discharge area via wheelchair.

## 2018-12-20 NOTE — Progress Notes (Addendum)
TRANSFER - OUT REPORT:    Verbal report given to Juanda Crumble, RN on [name]  being transferred to IR prep room 8 for routine post - op       Report consisted of patient???s Situation, Background, Assessment and   Recommendations(SBAR).     Information from the following report(s) SBAR, Kardex, Procedure Summary and MAR was reviewed with the receiving nurse.    Lines:       Opportunity for questions and clarification was provided.    3 month follow up appointment to be made.

## 2018-12-20 NOTE — Procedures (Signed)
Department of Interventional Radiology  [phone]        Interventional Radiology Brief Procedure Note    Patient: Cathy Jackson MRN: [id number]  SSN: [id number]    Date of Birth: 06/10/1946  Age: 73 y.o.  Sex: female      Date of Procedure: 12/20/2018    Pre-Procedure Diagnosis: Chronic ureteral stricture.     Post-Procedure Diagnosis: SAME    Procedure(s): Retrograde nephro-ureteral drain exchange.     Brief Description of Procedure: as above    Performed By: Vladimir Creeks, MD     Assistants: None    Anesthesia:None    Estimated Blood Loss: None    Specimens:  None    Implants:  Internal/External Biliary Drain    Findings: Moderate right hydronephrosis--stabel.     Complications: None    Recommendations: Gravity drainage.      Follow Up: 3 months.  No sedation.  Norco for pain.      Signed By: Vladimir Creeks, MD     December 20, 2018

## 2018-12-21 ENCOUNTER — Telehealth: Admit: 2018-12-21 | Discharge: 2018-12-21 | Payer: MEDICARE | Attending: Community Health | Primary: Community Health

## 2018-12-21 DIAGNOSIS — I10 Essential (primary) hypertension: Secondary | ICD-10-CM

## 2018-12-21 NOTE — Progress Notes (Signed)
Cathy Jackson is a 73 y.o. female who was seen by synchronous (real-time) audio-video technology on 12/21/2018.      Consent: Cathy Jackson, who was seen by synchronous (real-time) audio-video technology, and/or her healthcare decision maker, is aware that this patient-initiated, Telehealth encounter on 12/21/2018 is a billable service, with coverage as determined by her insurance carrier. She is aware that she may receive a bill and has provided verbal consent to proceed: Yes.    Cathy Jackson and her daughter present today for a virtual visit and follow up. She is also followed by geriatric medicine and she is in  Need of routine lab work and Depakote levels. Daughter also states that she has been experiencing frequent fall recently. I will add a b12 and folate level. No other concerns verbalized at this time. I have advise them to go to [facility] for lab work and I will call with lab results and fax them to geriatric medicine. She denies sob, chest pain, palpitations or fever.       Assessment & Plan:   Diagnoses and all orders for this visit:    1. Essential hypertension  Assessment & Plan:  Stable, based on history, physical exam and review of pertinent labs, studies and medications; meds reconciled; continue current treatment plan, lifestyle modifications recommended, medication compliance emphasized.  Key CAD CHF Meds             simvastatin (ZOCOR) 20 mg tablet (Taking)         Lab Results   Component Value Date/Time    Sodium 137 04/15/2018 10:06 AM    Potassium 3.4 04/15/2018 10:06 AM    Cholesterol, total 146 03/01/2018 08:57 AM    HDL Cholesterol 51 03/01/2018 08:57 AM    LDL, calculated 67 03/01/2018 08:57 AM    Triglyceride 138 03/01/2018 08:57 AM       Orders:  -     METABOLIC PANEL, COMPREHENSIVE; Future    2. Frontotemporal dementia [facility])  Assessment & Plan:  This condition is managed by Specialist.  Key Psychotherapeutic Meds              traZODone (DESYREL) 50 mg tablet (Taking) Take  by mouth nightly.    PARoxetine (PAXIL) 20 mg tablet (Taking) Take 1 Tab by mouth daily.    donepezil (ARICEPT) 10 mg tablet (Taking) Take 1 Tab by mouth nightly.            Orders:  -     CBC WITH AUTOMATED DIFF; Future  -     VITAMIN B12; Future  -     FOLATE; Future  -     VALPROIC ACID; Future    3. Frequent falls  -     VITAMIN B12; Future  -     FOLATE; Future    The complexity of medical decision making for this visit is moderate   Follow-up and Dispositions    ?? Return in about 1 month (around 01/20/2019).     I will call Cathy Jackson daughter and HPOA with lab results.       I spent at least 23 minutes on this visit with this established patient. [phone])    Subjective:   Cathy Jackson is a 73 y.o. female who was seen for Labs (pt's daughter stated that pt is being started back Trazodone and is needing for her levels to be checked to continue taking Trazodone. pt is under the care Katie at [facility].)      Prior to Admission medications    Medication Sig Start Date End Date Taking? Authorizing Provider   simvastatin (ZOCOR) 20 mg tablet  10/14/18  Yes Provider, Historical   traZODone (DESYREL) 50 mg tablet Take  by mouth nightly.   Yes Provider, Historical   methenamine hippurate (HIPREX) 1 gram tablet Take 1 Tab by mouth two (2) times daily (with meals). 07/12/18  Yes Curlene Labrum, NP   PARoxetine (PAXIL) 20 mg tablet Take 1 Tab by mouth daily. 04/20/18  Yes Rae Roam F, NP   donepezil (ARICEPT) 10 mg tablet Take 1 Tab by mouth nightly. 04/20/18  Yes Rae Roam F, NP   divalproex ER (DEPAKOTE ER) 500 mg ER tablet Take 500 mg by mouth. 04/06/18 04/06/19 Yes Provider, Historical   food supplemt, lactose-reduced (ENSURE ACTIVE CLEAR) liqd Take 237 mL by mouth four (4) times daily. 04/01/18  Yes Morene Crocker, MD   calcium carb-vitamin D3-vit K2 600 mg-1,000 unit-90 mcg tab Take  by mouth.   Yes Provider, Historical    famotidine (PEPCID) 20 mg tablet Take 20 mg by mouth.   Yes Provider, Historical   senna-docusate (PERICOLACE) 8.6-50 mg per tablet Take  by mouth.   Yes Provider, Historical   calcium carbonate (OS-CAL) 500 mg calcium (1,250 mg) tablet Take  by mouth daily.   Yes Provider, Historical   ascorbic acid, vitamin C, (VITAMIN C) 1,000 mg tablet Take  by mouth.   Yes Provider, Historical   ferrous sulfate (IRON) 325 mg (65 mg iron) tablet Take  by mouth Daily (before breakfast).   Yes Provider, Historical   HYDROcodone-acetaminophen (HYCET) 0.5-21.7 mg/mL oral solution Take  by mouth four (4) times daily as needed for Pain.  12/21/18  Provider, Historical     No Known Allergies    Patient Active Problem List   Diagnosis Code   ??? Bladder cancer ([place]) C67.9   ??? Urethral caruncle N36.2   ??? Unspecified disorder of bladder N32.9   ??? Asthma J45.909   ??? GERD without esophagitis K21.9   ??? Acquired hypothyroidism E03.9   ??? Essential hypertension I10   ??? Primary osteoarthritis involving multiple joints M15.0   ??? Dysthymia F34.1   ??? Mixed hyperlipidemia E78.2   ??? Perennial allergic rhinitis J30.89   ??? Frontotemporal dementia ([facility]) G31.09, F02.80   ??? Allergic rhinitis J30.9   ??? Iron deficiency anemia D50.9   ??? Depression with anxiety F41.8     Patient Active Problem List    Diagnosis Date Noted   ??? Allergic rhinitis 10/09/2017   ??? Acquired hypothyroidism 08/29/2015   ??? Essential hypertension 08/29/2015   ??? Primary osteoarthritis involving multiple joints 08/29/2015   ??? Dysthymia 08/29/2015   ??? Mixed hyperlipidemia 08/29/2015   ??? Perennial allergic rhinitis 08/29/2015   ??? Iron deficiency anemia 02/12/2015   ??? Frontotemporal dementia ([place]) 02/01/2015   ??? Depression with anxiety 02/01/2015   ??? Urethral caruncle 10/27/2012   ??? Unspecified disorder of bladder 10/27/2012   ??? Asthma 10/27/2012   ??? GERD without esophagitis 10/27/2012   ??? Bladder cancer ([place]) 09/20/2012     Current Outpatient Medications   Medication Sig Dispense Refill    ??? simvastatin (ZOCOR) 20 mg tablet      ??? traZODone (DESYREL) 50 mg tablet Take  by mouth nightly.     ??? methenamine hippurate (HIPREX) 1 gram tablet Take 1 Tab by mouth two (2) times daily (with meals).  180 Tab 6   ??? PARoxetine (PAXIL) 20 mg tablet Take 1 Tab by mouth daily. 90 Tab 2   ??? donepezil (ARICEPT) 10 mg tablet Take 1 Tab by mouth nightly. 90 Tab 2   ??? divalproex ER (DEPAKOTE ER) 500 mg ER tablet Take 500 mg by mouth.     ??? food supplemt, lactose-reduced (ENSURE ACTIVE CLEAR) liqd Take 237 mL by mouth four (4) times daily. 30 Can 3   ??? calcium carb-vitamin D3-vit K2 600 mg-1,000 unit-90 mcg tab Take  by mouth.     ??? famotidine (PEPCID) 20 mg tablet Take 20 mg by mouth.     ??? senna-docusate (PERICOLACE) 8.6-50 mg per tablet Take  by mouth.     ??? calcium carbonate (OS-CAL) 500 mg calcium (1,250 mg) tablet Take  by mouth daily.     ??? ascorbic acid, vitamin C, (VITAMIN C) 1,000 mg tablet Take  by mouth.     ??? ferrous sulfate (IRON) 325 mg (65 mg iron) tablet Take  by mouth Daily (before breakfast).       Facility-Administered Medications Ordered in Other Visits   Medication Dose Route Frequency Provider Last Rate Last Dose   ??? lidocaine (XYLOCAINE) 2 % viscous solution 15 mL  15 mL Mouth/Throat PRN Vladimir Creeks, MD   15 mL at 12/20/18 1250     No Known Allergies  Past Medical History:   Diagnosis Date   ??? Abdominal pain, unspecified site 10/27/2012   ??? Arthritis     generalized   ??? Asthma 10/27/2012   ??? Bladder cancer ([place])    ??? Depression     managed with medication    ??? Dysuria 10/27/2012   ??? Environmental allergies     managed with medication    ??? GERD (gastroesophageal reflux disease) 10/27/2012   ??? History of kidney stones     states surgical removed   ??? Hypothyroidism     managed with medication    ??? Other "heavy-for-dates" infants     ureterectomy and ileal conduit   ??? Unspecified disorder of bladder 10/27/2012   ??? Urethral caruncle 10/27/2012   ??? Urinary frequency 10/27/2012    ??? Urinary tract infection, site not specified 10/27/2012     Past Surgical History:   Procedure Laterality Date   ??? CLOSE CYSTOSTOMY      cystoscopy/ turbt x 2   ??? HX BREAST BIOPSY Left 2000    x 2   ??? HX CESAREAN SECTION      x 1   ??? HX CHOLECYSTECTOMY     ??? HX NEPHROSTOMY  2014   ??? HX TUBAL LIGATION     ??? HX WRIST FRACTURE TX Left    ??? IR CHANGE INTERNAL URETERAL STENT SI  02/15/2018   ??? IR CHANGE INTERNAL URETERAL STENT SI  04/15/2018   ??? IR CHANGE INTERNAL URETERAL STENT SI  06/14/2018   ??? IR CHANGE URET TUBE VIA ILEAL CONDUIT  09/07/2018   ??? IR CHANGE URET TUBE VIA ILEAL CONDUIT  12/20/2018   ??? IR CONVERT NEPHRO PERC RT TO NEPHROURETERAL CATH EXISTING SI  11/12/2017   ??? IR EXCHANGE NEPHRO PERC RT SI  10/23/2017     Family History   Problem Relation Age of Onset   ??? Stroke Mother    ??? Heart Disease Mother    ??? Cancer Father    ??? Asthma Father    ??? Lung Disease Father  COPD/smoker   ??? Cancer Brother    ??? Heart Disease Brother    ??? Alcohol abuse Brother      Social History     Tobacco Use   ??? Smoking status: Former Smoker     Packs/day: 0.50     Years: 20.00     Pack years: 10.00   ??? Smokeless tobacco: Never Used   ??? Tobacco comment: stopped at age 17   Substance Use Topics   ??? Alcohol use: No       Review of Systems   Constitutional: Negative for chills, fever and malaise/fatigue.   HENT: Negative for congestion, ear pain, sinus pain and sore throat.    Eyes: Negative for pain.   Respiratory: Negative for cough, sputum production, shortness of breath and wheezing.    Cardiovascular: Negative for chest pain and palpitations.   Gastrointestinal: Negative for abdominal pain, blood in stool, constipation, diarrhea, nausea and vomiting.   Genitourinary: Negative for dysuria, frequency and urgency.   Musculoskeletal: Negative for myalgias.   Neurological: Negative for dizziness, weakness and headaches.        Frequent falls   Psychiatric/Behavioral: Positive for memory loss (dementia). Negative for depression.        Objective:   Vital Signs: (As obtained by patient/caregiver at home)  Visit Vitals  BP 112/71 (BP 1 Location: Left arm, BP Patient Position: Sitting)   Pulse 90   Ht 5\' 2"  (1.575 m)   Wt 121 lb 9.6 oz (55.2 kg)   LMP  (LMP Unknown)   BMI 22.24 kg/m??        [INSTRUCTIONS:  "[x] " Indicates a positive item  "[] " Indicates a negative item  -- DELETE ALL ITEMS NOT EXAMINED]    Constitutional: [x]  Appears well-developed and well-nourished [x]  No apparent distress      []  Abnormal -     Mental status: [x]  Alert and awake  [x]  Oriented to person/place/time [x]  Able to follow commands    []  Abnormal -     Eyes:   EOM    [x]   Normal    []  Abnormal -   Sclera  [x]   Normal    []  Abnormal -          Discharge [x]   None visible   []  Abnormal -     HENT: [x]  Normocephalic, atraumatic  []  Abnormal -   [x]  Mouth/Throat: Mucous membranes are moist    External Ears [x]  Normal  []  Abnormal -    Neck: [x]  No visualized mass []  Abnormal -     Pulmonary/Chest: [x]  Respiratory effort normal   [x]  No visualized signs of difficulty breathing or respiratory distress        []  Abnormal -      Musculoskeletal:   [x]  Normal gait with no signs of ataxia         [x]  Normal range of motion of neck        []  Abnormal -     Neurological:        [x]  No Facial Asymmetry (Cranial nerve 7 motor function) (limited exam due to video visit)          [x]  No gaze palsy        []  Abnormal -          Skin:        [x]  No significant exanthematous lesions or discoloration noted on facial skin         []  Abnormal -  Psychiatric:       [x]  Normal Affect []  Abnormal -        [x]  No Hallucinations    Other pertinent observable physical exam findings:-        We discussed the expected course, resolution and complications of the diagnosis(es) in detail.  Medication risks, benefits, costs, interactions, and alternatives were discussed as indicated.  I advised her to contact the office if her condition worsens, changes or fails to improve as  anticipated. She expressed understanding with the diagnosis(es) and plan.       Boots Mcglown is a 73 y.o. female who was evaluated by a video visit encounter for concerns as above. Patient identification was verified prior to start of the visit. A caregiver was present when appropriate. Due to this being a Scientist, physiological (During CXKGY-18 public health emergency), evaluation of the following organ systems was limited: Vitals/Constitutional/EENT/Resp/CV/GI/GU/MS/Neuro/Skin/Heme-Lymph-Imm.  Pursuant to the emergency declaration under the [place], 1135 waiver authority and the [employer] Act, this Virtual  Visit was conducted, with patient's (and/or legal guardian's) consent, to reduce the patient's risk of exposure to COVID-19 and provide necessary medical care.     Services were provided through a video synchronous discussion virtually to substitute for in-person clinic visit.   Patient and provider were located at their individual homes.      Wilfred Lacy, NP

## 2018-12-21 NOTE — Assessment & Plan Note (Signed)
Stable, based on history, physical exam and review of pertinent labs, studies and medications; meds reconciled; continue current treatment plan, lifestyle modifications recommended, medication compliance emphasized.  Key CAD CHF Meds             simvastatin (ZOCOR) 20 mg tablet (Taking)         Lab Results   Component Value Date/Time    Sodium 137 04/15/2018 10:06 AM    Potassium 3.4 04/15/2018 10:06 AM    Cholesterol, total 146 03/01/2018 08:57 AM    HDL Cholesterol 51 03/01/2018 08:57 AM    LDL, calculated 67 03/01/2018 08:57 AM    Triglyceride 138 03/01/2018 08:57 AM

## 2018-12-21 NOTE — Assessment & Plan Note (Signed)
This condition is managed by Specialist.  Key Psychotherapeutic Meds             traZODone (DESYREL) 50 mg tablet (Taking) Take  by mouth nightly.    PARoxetine (PAXIL) 20 mg tablet (Taking) Take 1 Tab by mouth daily.    donepezil (ARICEPT) 10 mg tablet (Taking) Take 1 Tab by mouth nightly.

## 2018-12-22 ENCOUNTER — Other Ambulatory Visit: Payer: Self-pay | Admitting: Family Medicine

## 2018-12-24 DEATH — deceased

## 2018-12-28 NOTE — Telephone Encounter (Signed)
Spoke with [place] patients daughter. Wants to push out til summer, patient is doing good

## 2018-12-28 NOTE — Telephone Encounter (Signed)
-----   Message from Laverle Hobby, DO sent at 12/26/2018  1:52 PM EDT -----  Regarding: please change appt to telemed.

## 2018-12-30 ENCOUNTER — Encounter: Attending: Urology | Primary: Community Health

## 2018-12-30 ENCOUNTER — Inpatient Hospital Stay: Admit: 2018-12-30 | Payer: MEDICARE | Primary: Community Health

## 2018-12-30 DIAGNOSIS — G3109 Other frontotemporal dementia: Secondary | ICD-10-CM

## 2018-12-30 LAB — METABOLIC PANEL, COMPREHENSIVE
A-G Ratio: 0.9 — ABNORMAL LOW (ref 1.2–3.5)
ALT (SGPT): 10 U/L — ABNORMAL LOW (ref 12–65)
AST (SGOT): 14 U/L — ABNORMAL LOW (ref 15–37)
Albumin: 3.4 g/dL (ref 3.2–4.6)
Alk. phosphatase: 68 U/L (ref 50–130)
Anion gap: 4 mmol/L — ABNORMAL LOW (ref 7–16)
BUN: 22 MG/DL (ref 8–23)
Bilirubin, total: 0.2 MG/DL (ref 0.2–1.1)
CO2: 31 mmol/L (ref 21–32)
Calcium: 9 MG/DL (ref 8.3–10.4)
Chloride: 103 mmol/L (ref 98–107)
Creatinine: 1.08 MG/DL — ABNORMAL HIGH (ref 0.6–1.0)
GFR est AA: >60 mL/min/{1.73_m2} (ref >60)
GFR est non-AA: 53 mL/min/{1.73_m2} — ABNORMAL LOW (ref >60)
Globulin: 4 g/dL — ABNORMAL HIGH (ref 2.3–3.5)
Glucose: 109 mg/dL — ABNORMAL HIGH (ref 65–100)
Potassium: 4.4 mmol/L (ref 3.5–5.1)
Protein, total: 7.4 g/dL (ref 6.3–8.2)
Sodium: 138 mmol/L (ref 136–145)

## 2018-12-30 LAB — CBC WITH AUTOMATED DIFF
ABS. BASOPHILS: 0 10*3/uL (ref 0.0–0.2)
ABS. EOSINOPHILS: 0.2 10*3/uL (ref 0.0–0.8)
ABS. IMM. GRANS.: 0 10*3/uL (ref 0.0–0.5)
ABS. LYMPHOCYTES: 1.6 10*3/uL (ref 0.5–4.6)
ABS. MONOCYTES: 0.6 10*3/uL (ref 0.1–1.3)
ABS. NEUTROPHILS: 6.5 10*3/uL (ref 1.7–8.2)
ABSOLUTE NRBC: 0 10*3/uL (ref 0.0–0.2)
BASOPHILS: 0 % (ref 0.0–2.0)
EOSINOPHILS: 2 % (ref 0.5–7.8)
HCT: 46.2 % (ref 35.8–46.3)
HGB: 14.9 g/dL (ref 11.7–15.4)
IMMATURE GRANULOCYTES: 0 % (ref 0.0–5.0)
LYMPHOCYTES: 18 % (ref 13–44)
MCH: 30.4 PG (ref 26.1–32.9)
MCHC: 32.3 g/dL (ref 31.4–35.0)
MCV: 94.3 FL (ref 79.6–97.8)
MONOCYTES: 7 % (ref 4.0–12.0)
MPV: 10.2 FL (ref 9.4–12.3)
NEUTROPHILS: 72 % (ref 43–78)
PLATELET: 202 10*3/uL (ref 150–450)
RBC: 4.9 M/uL (ref 4.05–5.2)
RDW: 13.1 % (ref 11.9–14.6)
WBC: 9 10*3/uL (ref 4.3–11.1)

## 2018-12-30 LAB — FOLATE: Folate: 29.8 ng/mL — ABNORMAL HIGH (ref 3.1–17.5)

## 2018-12-30 LAB — VITAMIN B12: Vitamin B12: 566 pg/mL (ref 193–986)

## 2018-12-30 LAB — VALPROIC ACID: Valproic acid: 70 ug/ml (ref 50–100)

## 2018-12-30 NOTE — Progress Notes (Signed)
Daughter aware of lab results. I will [facility] a [facility] to [facility] NP few [phone]

## 2019-01-05 MED ORDER — SIMVASTATIN 20 MG TAB
20 mg | ORAL_TABLET | ORAL | 2 refills | Status: AC
Start: 2019-01-05 — End: ?

## 2019-01-11 ENCOUNTER — Other Ambulatory Visit: Payer: Self-pay | Admitting: Family Medicine

## 2019-02-22 ENCOUNTER — Other Ambulatory Visit: Admit: 2019-02-22 | Discharge: 2019-02-22 | Payer: MEDICARE | Attending: Registered Nurse | Primary: Community Health

## 2019-02-22 DIAGNOSIS — N3000 Acute cystitis without hematuria: Secondary | ICD-10-CM

## 2019-02-22 MED ORDER — DOXYCYCLINE 100 MG CAP
100 mg | ORAL_CAPSULE | Freq: Two times a day (BID) | ORAL | 0 refills | Status: AC
Start: 2019-02-22 — End: 2019-03-01

## 2019-02-22 NOTE — Progress Notes (Signed)
Urine culture inconclusive for infection. If she is feeling better on the doxycyline, complete this. If she is not feeling better we need to repeat culture.

## 2019-02-22 NOTE — Progress Notes (Signed)
Urine is viscous. Unable to run UA. Will send UC. Doxycyline sent to pharmacy. ?UTI causing patient confusion. Advised to call sooner if needed.    Sanda Linger, FNP

## 2019-02-25 LAB — CULTURE, URINE

## 2019-03-02 ENCOUNTER — Encounter: Payer: MEDICARE | Attending: Urology | Primary: Community Health

## 2019-03-09 ENCOUNTER — Encounter

## 2019-03-14 ENCOUNTER — Institutional Professional Consult (permissible substitution): Payer: MEDICARE | Primary: Community Health

## 2019-03-14 DIAGNOSIS — Z20822 Contact with and (suspected) exposure to covid-19: Secondary | ICD-10-CM

## 2019-03-14 NOTE — Progress Notes (Signed)
Patient presented to the [facility] for COVID testing as ordered.  After verbal consent given by patient/caregiver,  nasal swab obtained by RN or LPN and sent to lab for processing.

## 2019-03-16 LAB — NOVEL CORONAVIRUS (COVID-19): SARS-CoV-2, NAA: NOT DETECTED

## 2019-03-18 NOTE — Progress Notes (Signed)
Screening for COVID-19 During Preassessment    1) Do you currently have signs or symptoms of a respiratory infection, such as fever, cough, shortness of breath or sore throat?  no    2) In the last 14 days have you had contact with any of the following:   A) Someone with confirmed or presumptive diagnosis of COVID-19?   NO    Or B) Someone under investigation for COVID-19?   NO    Or  C) Someone who has been diagnosed with COVID-19?   NO    3) In the last 14 days have you traveled or has someone in your home traveled to [place], [place], [place], [place], [place], [place], [place], or [place]?   No

## 2019-03-21 ENCOUNTER — Encounter

## 2019-03-21 ENCOUNTER — Inpatient Hospital Stay: Admit: 2019-03-21 | Payer: MEDICARE | Attending: Diagnostic Radiology | Primary: Community Health

## 2019-03-21 DIAGNOSIS — Z436 Encounter for attention to other artificial openings of urinary tract: Secondary | ICD-10-CM

## 2019-03-21 MED ORDER — IOPAMIDOL 61 % INTRATHECAL
61 % | Freq: Once | INTRATHECAL | Status: AC
Start: 2019-03-21 — End: 2019-03-21
  Administered 2019-03-21: 14:00:00 via INTRABILIARY

## 2019-03-21 MED ORDER — LIDOCAINE HCL 2 % (20 MG/ML) IJ SOLN
20 mg/mL (2 %) | Freq: Once | INTRAMUSCULAR | Status: AC
Start: 2019-03-21 — End: 2019-03-21

## 2019-03-21 MED ORDER — LIDOCAINE 2 % MUCOSAL SOLN
2 % | Freq: Once | Status: AC
Start: 2019-03-21 — End: 2019-03-21
  Administered 2019-03-21: 14:00:00 via OROMUCOSAL

## 2019-03-21 MED ORDER — LIDOCAINE 2 % MUCOSAL SOLN
2 % | Status: AC
Start: 2019-03-21 — End: ?

## 2019-03-21 MED ORDER — LIDOCAINE HCL 2 % (20 MG/ML) IJ SOLN
20 mg/mL (2 %) | INTRAMUSCULAR | Status: AC
Start: 2019-03-21 — End: ?

## 2019-03-21 MED FILL — XYLOCAINE 20 MG/ML (2 %) INJECTION SOLUTION: 20 mg/mL (2 %) | INTRAMUSCULAR | Qty: 20

## 2019-03-21 MED FILL — LIDOCAINE 2 % MUCOSAL SOLN: 2 % | Qty: 15

## 2019-03-21 NOTE — Progress Notes (Signed)
The patient was counseled at length about the risks of contracting Covid-19 during their perioperative period and any recovery window from their procedure.  The patient was made aware that contracting Covid-19  may worsen their prognosis for recovering from their procedure and lend to a higher morbidity and/or mortality risk.  All material risks, benefits, and reasonable alternatives including postponing the procedure were discussed. The patient does  wish to proceed with the procedure at this time.

## 2019-03-21 NOTE — Progress Notes (Signed)
IR Nurse Pre-Procedure Checklist Part 2          Consent signed: Yes    H&P complete:  Yes    Antibiotics: No    Airway/Mallampati Done: Yes    Shaved: Yes    Pregnancy Form:No    Patient Position: Yes    MD Side: Yes     Biopsy Worksheet: No    Specimen Medium: No

## 2019-03-21 NOTE — Procedures (Signed)
Department of Interventional Radiology  [phone]        Interventional Radiology Brief Procedure Note    Patient: Cathy Jackson MRN: [id number]  SSN: [id number]    Date of Birth: 1946-07-05  Age: 73 y.o.  Sex: female      Date of Procedure: 03/21/2019    Pre-Procedure Diagnosis: Ureteral obstruction in ileal conduit    Post-Procedure Diagnosis: SAME    Procedure(s): Percutaneous Nephrostomy Tube Exchange    Brief Description of Procedure: Successful exchange of a 10 Fr retrograde nephro-ureteral on the right. Old tube appears occluded due to encrustations. Mild right hydronephrosis. New tube in appropriate position.     Recommend follow up exchange in 6 weeks given extensive encrustations. No sedation required.     Performed By: Carolan Shiver, MD     Assistants: None    Anesthesia:None    Estimated Blood Loss: Less than 59ml    Specimens:  None    Implants: As above    Findings: As above    Complications: None    Signed By: Carolan Shiver, MD     March 21, 2019

## 2019-03-23 ENCOUNTER — Encounter: Payer: Self-pay | Admitting: Family Medicine

## 2019-03-23 ENCOUNTER — Other Ambulatory Visit: Payer: Self-pay

## 2019-03-23 ENCOUNTER — Ambulatory Visit (INDEPENDENT_AMBULATORY_CARE_PROVIDER_SITE_OTHER): Payer: Medicare PPO | Admitting: Family Medicine

## 2019-03-23 VITALS — BP 120/64 | HR 62 | Temp 97.9°F | Resp 14 | Ht 67.0 in | Wt 179.0 lb

## 2019-03-23 DIAGNOSIS — Z8673 Personal history of transient ischemic attack (TIA), and cerebral infarction without residual deficits: Secondary | ICD-10-CM

## 2019-03-23 DIAGNOSIS — E7800 Pure hypercholesterolemia, unspecified: Secondary | ICD-10-CM

## 2019-03-23 DIAGNOSIS — I10 Essential (primary) hypertension: Secondary | ICD-10-CM | POA: Diagnosis not present

## 2019-03-23 DIAGNOSIS — M25511 Pain in right shoulder: Secondary | ICD-10-CM | POA: Diagnosis not present

## 2019-03-23 DIAGNOSIS — M25512 Pain in left shoulder: Secondary | ICD-10-CM

## 2019-03-23 DIAGNOSIS — Z9889 Other specified postprocedural states: Secondary | ICD-10-CM | POA: Diagnosis not present

## 2019-03-23 DIAGNOSIS — R4189 Other symptoms and signs involving cognitive functions and awareness: Secondary | ICD-10-CM | POA: Diagnosis not present

## 2019-03-23 NOTE — Patient Instructions (Signed)
F/U 6 months for wellness

## 2019-03-23 NOTE — Assessment & Plan Note (Signed)
Blood pressures well controlled no change in medications.  Check her renal function and her lipids she is on statin drug.

## 2019-03-23 NOTE — Assessment & Plan Note (Signed)
Postcraniotomy benign tumor she is on Keppra for seizure prophylaxis.  She has had some cognitive decline in the setting of her age.  She is maintained on Aricept and has done well with this.

## 2019-03-23 NOTE — Assessment & Plan Note (Signed)
Chronic pain in her shoulders have some arthritis.  She is getting try glucosamine supplement at this point not interfering with her activities

## 2019-03-23 NOTE — Progress Notes (Signed)
   Subjective:    Patient ID: Molly Patel, female    DOB: [birth year], 73 y.o.   MRN: [id number]  Patient presents for Follow-up (is fasting)  S/P craniotomy- taking keppra    HTN- well controlled , atenolol and HCTZ    Rarely takes lasix     Glaucoma- per eye doctor , note she does not want surgical intervention for her drooping eyelids     Hyperlipedemia history of stroke- taking statin drug  Recheck lipids and LFT today    She gets stiff in joints in shoulders in the mornings she is going to try glucosamine     She is taking her vitamin D    Memory changes- she is taking aricept    She is walking at night     She has lost 5lbs since Jan, she has been eating a little different and avoiding salty foods      Review Of Systems:  GEN- denies fatigue, fever, weight loss,weakness, recent illness HEENT- denies eye drainage, change in vision, nasal discharge, CVS- denies chest pain, palpitations RESP- denies SOB, cough, wheeze ABD- denies N/V, change in stools, abd pain GU- denies dysuria, hematuria, dribbling, incontinence MSK- + joint pain, muscle aches, injury Neuro- denies headache, dizziness, syncope, seizure activity       Objective:    BP 120/64   Pulse 62   Temp 97.9 F (36.6 C) (Oral)   Resp 14   Ht 5\' 7"  (1.702 m)   Wt 179 lb (81.2 kg)   SpO2 97%   BMI 28.04 kg/m  GEN- NAD, alert and oriented x3 HEENT- PERRL, EOMI, non injected sclera, pink conjunctiva, MMM, oropharynx clear Neck- Supple, no thyromegaly CVS- RRR, no murmur RESP-CTAB ABD-NABS,soft,NT,ND MSK- Fair ROm upper and lower ext  EXT- trace ankle  edema Pulses- Radial, DP- 2+        Assessment & Plan:      Problem List Items Addressed This Visit      Unprioritized   Cognitive changes   History of CVA (cerebrovascular accident)   Hyperlipidemia   Relevant Orders   Lipid panel   Hypertension - Primary    Blood pressures well controlled no change in medications.  Check her  renal function and her lipids she is on statin drug.      Relevant Orders   Comprehensive metabolic panel   CBC with Differential/Platelet   Pain in joint, shoulder region    Chronic pain in her shoulders have some arthritis.  She is getting try glucosamine supplement at this point not interfering with her activities      S/P craniotomy    Postcraniotomy benign tumor she is on Keppra for seizure prophylaxis.  She has had some cognitive decline in the setting of her age.  She is maintained on Aricept and has done well with this.         Note: This dictation was prepared with Dragon dictation along with smaller phrase technology. Any transcriptional errors that result from this process are unintentional.

## 2019-03-24 LAB — LIPID PANEL
Cholesterol: 191 mg/dL (ref <200)
HDL: 57 mg/dL (ref >=50)
LDL Cholesterol (Calc): 107 mg/dL (calc) — ABNORMAL HIGH
Non-HDL Cholesterol (Calc): 134 mg/dL (calc) — ABNORMAL HIGH (ref <130)
Total CHOL/HDL Ratio: 3.4 (calc) (ref <5.0)
Triglycerides: 158 mg/dL — ABNORMAL HIGH (ref <150)

## 2019-03-24 LAB — CBC WITH DIFFERENTIAL/PLATELET
Absolute Monocytes: 513 cells/uL (ref 200–950)
Basophils Absolute: 49 cells/uL (ref 0–200)
Basophils Relative: 1.2 %
Eosinophils Absolute: 82 cells/uL (ref 15–500)
Eosinophils Relative: 2 %
HCT: 41.1 % (ref 35.0–45.0)
Hemoglobin: 14.1 g/dL (ref 11.7–15.5)
Lymphs Abs: 1341 cells/uL (ref 850–3900)
MCH: 30.9 pg (ref 27.0–33.0)
MCHC: 34.3 g/dL (ref 32.0–36.0)
MCV: 90.1 fL (ref 80.0–100.0)
MPV: 10.6 fL (ref 7.5–12.5)
Monocytes Relative: 12.5 %
Neutro Abs: 2116 cells/uL (ref 1500–7800)
Neutrophils Relative %: 51.6 %
Platelets: 156 10*3/uL (ref 140–400)
RBC: 4.56 10*6/uL (ref 3.80–5.10)
RDW: 13.8 % (ref 11.0–15.0)
Total Lymphocyte: 32.7 %
WBC: 4.1 10*3/uL (ref 3.8–10.8)

## 2019-03-24 LAB — COMPREHENSIVE METABOLIC PANEL
AG Ratio: 1.7 (calc) (ref 1.0–2.5)
ALT: 16 U/L (ref 6–29)
AST: 17 U/L (ref 10–35)
Albumin: 4.5 g/dL (ref 3.6–5.1)
Alkaline phosphatase (APISO): 62 U/L (ref 37–153)
BUN: 14 mg/dL (ref 7–25)
CO2: 26 mmol/L (ref 20–32)
Calcium: 10 mg/dL (ref 8.6–10.4)
Chloride: 105 mmol/L (ref 98–110)
Creat: 0.86 mg/dL (ref 0.60–0.93)
Globulin: 2.6 g/dL (calc) (ref 1.9–3.7)
Glucose, Bld: 86 mg/dL (ref 65–99)
Potassium: 4.1 mmol/L (ref 3.5–5.3)
Sodium: 140 mmol/L (ref 135–146)
Total Bilirubin: 0.7 mg/dL (ref 0.2–1.2)
Total Protein: 7.1 g/dL (ref 6.1–8.1)

## 2019-03-25 ENCOUNTER — Encounter: Payer: Self-pay | Admitting: *Deleted

## 2019-03-29 ENCOUNTER — Encounter

## 2019-03-29 ENCOUNTER — Inpatient Hospital Stay: Admit: 2019-03-29 | Payer: MEDICARE | Attending: Diagnostic Radiology | Primary: Community Health

## 2019-03-29 ENCOUNTER — Encounter: Payer: MEDICARE | Attending: Urology | Primary: Community Health

## 2019-03-29 DIAGNOSIS — T8029XA Infection following other infusion, transfusion and therapeutic injection, initial encounter: Secondary | ICD-10-CM

## 2019-03-29 MED ORDER — CIPROFLOXACIN IN D5W 400 MG/200 ML IV PIGGY BACK
400 mg/200 mL | Freq: Once | INTRAVENOUS | Status: AC
Start: 2019-03-29 — End: 2019-03-29
  Administered 2019-03-29: 18:00:00 via INTRAVENOUS

## 2019-03-29 MED ORDER — SODIUM CHLORIDE 0.9 % IV
INTRAVENOUS | Status: DC
Start: 2019-03-29 — End: 2019-03-30

## 2019-03-29 MED ORDER — CIPROFLOXACIN IN D5W 400 MG/200 ML IV PIGGY BACK
400 mg/200 mL | INTRAVENOUS | Status: AC
Start: 2019-03-29 — End: ?

## 2019-03-29 MED ORDER — MIDAZOLAM 1 MG/ML IJ SOLN
1 mg/mL | INTRAMUSCULAR | Status: AC
Start: 2019-03-29 — End: ?

## 2019-03-29 MED ORDER — FENTANYL CITRATE (PF) 50 MCG/ML IJ SOLN
50 mcg/mL | INTRAMUSCULAR | Status: DC | PRN
Start: 2019-03-29 — End: 2019-04-02
  Administered 2019-03-29: 17:00:00 via INTRAVENOUS

## 2019-03-29 MED ORDER — LIDOCAINE HCL 2 % (20 MG/ML) IJ SOLN
20 mg/mL (2 %) | Freq: Once | INTRAMUSCULAR | Status: AC
Start: 2019-03-29 — End: 2019-03-29
  Administered 2019-03-29: 18:00:00 via INTRADERMAL

## 2019-03-29 MED ORDER — LIDOCAINE HCL 2 % (20 MG/ML) IJ SOLN
20 mg/mL (2 %) | INTRAMUSCULAR | Status: AC
Start: 2019-03-29 — End: ?

## 2019-03-29 MED ORDER — IOPAMIDOL 61 % IV SOLN
300 mg iodine /mL (61 %) | Freq: Once | INTRAVENOUS | Status: AC
Start: 2019-03-29 — End: 2019-03-29
  Administered 2019-03-29: 18:00:00 via INTRAVENOUS

## 2019-03-29 MED ORDER — HYDROCODONE-ACETAMINOPHEN 5 MG-325 MG TAB
5-325 mg | ORAL | Status: DC | PRN
Start: 2019-03-29 — End: 2019-04-02

## 2019-03-29 MED ORDER — SODIUM CHLORIDE 0.9 % IV
INTRAVENOUS | Status: DC
Start: 2019-03-29 — End: 2019-04-02
  Administered 2019-03-29: 17:00:00 via INTRAVENOUS

## 2019-03-29 MED ORDER — FENTANYL CITRATE (PF) 50 MCG/ML IJ SOLN
50 mcg/mL | INTRAMUSCULAR | Status: AC
Start: 2019-03-29 — End: ?

## 2019-03-29 MED ORDER — MIDAZOLAM 1 MG/ML IJ SOLN
1 mg/mL | INTRAMUSCULAR | Status: DC | PRN
Start: 2019-03-29 — End: 2019-04-02
  Administered 2019-03-29: 18:00:00 via INTRAVENOUS

## 2019-03-29 MED FILL — MIDAZOLAM 1 MG/ML IJ SOLN: 1 mg/mL | INTRAMUSCULAR | Qty: 4

## 2019-03-29 MED FILL — FENTANYL CITRATE (PF) 50 MCG/ML IJ SOLN: 50 mcg/mL | INTRAMUSCULAR | Qty: 2

## 2019-03-29 MED FILL — XYLOCAINE 20 MG/ML (2 %) INJECTION SOLUTION: 20 mg/mL (2 %) | INTRAMUSCULAR | Qty: 20

## 2019-03-29 MED FILL — CIPROFLOXACIN IN D5W 400 MG/200 ML IV PIGGY BACK: 400 mg/200 mL | INTRAVENOUS | Qty: 200

## 2019-03-29 NOTE — Procedures (Signed)
Department of Interventional Radiology  [phone]        Interventional Radiology Brief Procedure Note    Patient: Cathy Jackson MRN: [id number]  SSN: [id number]    Date of Birth: Oct 31, 1945  Age: 74 y.o.  Sex: female      Date of Procedure: 03/29/2019    Pre-Procedure Diagnosis: Right UPJ obstruction, Hydronephrosis, Pyonephrosis, Ileal conduit.     Post-Procedure Diagnosis: SAME    Procedure(s): Percutaneous Nephro-Ureteral Tube Placement    Brief Description of Procedure: as above    Performed By: Vladimir Creeks, MD     Assistants: None    Anesthesia:Moderate Sedation    Estimated Blood Loss: None    Specimens:  Microbiology    Implants:  Nephro-Ureteral Drain    Findings: R UPJ obstruction.     Complications: None    Recommendations: 1 hour bedrest.      Follow Up: 2 months.      Signed By: Vladimir Creeks, MD     March 29, 2019

## 2019-03-29 NOTE — H&P (Signed)
Department of Interventional Radiology  [phone])    History and Physical    Patient:  Cathy Jackson MRN:  [id number]  SSN:  [id number]    Date of Birth:  19-Jun-1946  Age:  73 y.o.  Sex:  female      Primary Care Provider:  Wilfred Lacy, NP  Referring Physician:  Carolan Shiver, MD    Subjective:     Chief Complaint: Dislodged Retrograde Nephro-Ureteral Catheter.     History of the Present Illness:  The patient is a 73 y.o. female who presents with a completely dislodged RNUC.  The [place] has been partially dislodged on multiple previous occasions.  Dementia.  Frailty. History is primarily from her daughter.   Daughter describes about a 2 week history of decline, poor appetite and minimal oral intake.      Past Medical History:   Diagnosis Date   ??? Abdominal pain, unspecified site 10/27/2012   ??? Arthritis     generalized   ??? Asthma 10/27/2012   ??? Bladder cancer ([place])    ??? Depression     managed with medication    ??? Dysuria 10/27/2012   ??? Environmental allergies     managed with medication    ??? GERD (gastroesophageal reflux disease) 10/27/2012   ??? History of kidney stones     states surgical removed   ??? Hypothyroidism     managed with medication    ??? Other "heavy-for-dates" infants     ureterectomy and ileal conduit   ??? Unspecified disorder of bladder 10/27/2012   ??? Urethral caruncle 10/27/2012   ??? Urinary frequency 10/27/2012   ??? Urinary tract infection, site not specified 10/27/2012     Past Surgical History:   Procedure Laterality Date   ??? CLOSE CYSTOSTOMY      cystoscopy/ turbt x 2   ??? HX BREAST BIOPSY Left 2000    x 2   ??? HX CESAREAN SECTION      x 1   ??? HX CHOLECYSTECTOMY     ??? HX NEPHROSTOMY  2014   ??? HX TUBAL LIGATION     ??? HX WRIST FRACTURE TX Left    ??? IR CHANGE INTERNAL URETERAL STENT SI  02/15/2018   ??? IR CHANGE INTERNAL URETERAL STENT SI  04/15/2018   ??? IR CHANGE INTERNAL URETERAL STENT SI  06/14/2018   ??? IR CHANGE URET TUBE VIA ILEAL CONDUIT  09/07/2018    ??? IR CHANGE URET TUBE VIA ILEAL CONDUIT  12/20/2018   ??? IR CHANGE URET TUBE VIA ILEAL CONDUIT  03/21/2019   ??? IR CONVERT NEPHRO PERC RT TO NEPHROURETERAL CATH EXISTING SI  11/12/2017   ??? IR EXCHANGE NEPHRO PERC RT SI  10/23/2017        Review of Systems:    Pertinent items are noted in the History of Present Illness.    Current Outpatient Medications   Medication Sig   ??? simvastatin (ZOCOR) 20 mg tablet TAKE 1 TABLET BY MOUTH EVERY DAY AT NIGHT   ??? traZODone (DESYREL) 50 mg tablet Take  by mouth nightly.   ??? methenamine hippurate (HIPREX) 1 gram tablet Take 1 Tab by mouth two (2) times daily (with meals).   ??? PARoxetine (PAXIL) 20 mg tablet Take 1 Tab by mouth daily.   ??? donepezil (ARICEPT) 10 mg tablet Take 1 Tab by mouth nightly.   ??? divalproex ER (DEPAKOTE ER) 500 mg ER tablet Take 500 mg by mouth.   ??? food  supplemt, lactose-reduced (ENSURE ACTIVE CLEAR) liqd Take 237 mL by mouth four (4) times daily.   ??? calcium carb-vitamin D3-vit K2 600 mg-1,000 unit-90 mcg tab Take  by mouth.   ??? famotidine (PEPCID) 20 mg tablet Take 20 mg by mouth.   ??? senna-docusate (PERICOLACE) 8.6-50 mg per tablet Take  by mouth.   ??? calcium carbonate (OS-CAL) 500 mg calcium (1,250 mg) tablet Take  by mouth daily.   ??? ascorbic acid, vitamin C, (VITAMIN C) 1,000 mg tablet Take  by mouth.   ??? ferrous sulfate (IRON) 325 mg (65 mg iron) tablet Take  by mouth Daily (before breakfast).     No current facility-administered medications for this encounter.         No Known Allergies    Family History   Problem Relation Age of Onset   ??? Stroke Mother    ??? Heart Disease Mother    ??? Cancer Father    ??? Asthma Father    ??? Lung Disease Father         COPD/smoker   ??? Cancer Brother    ??? Heart Disease Brother    ??? Alcohol abuse Brother      Social History     Tobacco Use   ??? Smoking status: Former Smoker     Packs/day: 0.50     Years: 20.00     Pack years: 10.00   ??? Smokeless tobacco: Never Used   ??? Tobacco comment: stopped at age 83   Substance Use Topics    ??? Alcohol use: No        Objective:       Physical Examination:    Vitals:    03/29/19 1210   BP: 148/67   Pulse: 82   Resp: 14   Temp: 98 ??F (36.7 ??C)   SpO2: 94%   Weight: 49.9 kg (110 lb)   Height: 5\' 2"  (1.575 m)     Blood pressure 148/67, pulse 82, temperature 98 ??F (36.7 ??C), resp. rate 14, height 5\' 2"  (1.575 m), weight 49.9 kg (110 lb), SpO2 94 %, not currently breastfeeding.  General:  alert, cooperative, no distress  Heart:  regular rate and rhythm, S1, S2 normal, no murmur, click, rub or gallop  Lungs:  clear to auscultation bilaterally  Abdomen:  soft, nondistended, normal bowel sounds, ostomy appliance in the RLQ (no drain visible)  Neuro:  normal without focal findings    Laboratory:     Lab Results   Component Value Date/Time    Sodium 138 12/30/2018 09:10 AM    Sodium 137 04/15/2018 10:06 AM    Potassium 4.4 12/30/2018 09:10 AM    Potassium 3.4 (L) 04/15/2018 10:06 AM    Chloride 103 12/30/2018 09:10 AM    Chloride 104 04/15/2018 10:06 AM    CO2 31 12/30/2018 09:10 AM    CO2 28 04/15/2018 10:06 AM    Anion gap 4 (L) 12/30/2018 09:10 AM    Anion gap 5 (L) 04/15/2018 10:06 AM    Glucose 109 (H) 12/30/2018 09:10 AM    Glucose 142 (H) 04/15/2018 10:06 AM    BUN 22 12/30/2018 09:10 AM    BUN 17 04/15/2018 10:06 AM    Creatinine 1.08 (H) 12/30/2018 09:10 AM    Creatinine 1.10 (H) 04/15/2018 10:06 AM    GFR est AA >60 12/30/2018 09:10 AM    GFR est AA >60 04/15/2018 10:06 AM    GFR est non-AA 53 (L) 12/30/2018 09:10 AM  GFR est non-AA 52 (L) 04/15/2018 10:06 AM    Calcium 9.0 12/30/2018 09:10 AM    Calcium 7.8 (L) 04/15/2018 10:06 AM    Magnesium 1.8 10/20/2012 05:38 AM    Magnesium 1.8 10/18/2012 05:47 AM    Albumin 3.4 12/30/2018 09:10 AM    Albumin 2.5 (L) 04/15/2018 10:06 AM    Protein, total 7.4 12/30/2018 09:10 AM    Protein, total 6.5 04/15/2018 10:06 AM    Globulin 4.0 (H) 12/30/2018 09:10 AM    Globulin 4.0 (H) 04/15/2018 10:06 AM    A-G Ratio 0.9 (L) 12/30/2018 09:10 AM     A-G Ratio 0.6 (L) 04/15/2018 10:06 AM    ALT (SGPT) 10 (L) 12/30/2018 09:10 AM    ALT (SGPT) 7 (L) 04/15/2018 10:06 AM     Lab Results   Component Value Date/Time    WBC 9.0 12/30/2018 09:10 AM    WBC 13.1 (H) 04/15/2018 10:06 AM    HGB 14.9 12/30/2018 09:10 AM    HGB 11.3 (L) 04/15/2018 10:06 AM    HCT 46.2 12/30/2018 09:10 AM    HCT 36.7 04/15/2018 10:06 AM    PLATELET 202 12/30/2018 09:10 AM    PLATELET 216 04/15/2018 10:06 AM     Lab Results   Component Value Date/Time    aPTT 25.7 10/11/2012 03:40 PM    Prothrombin time 10.6 10/11/2012 03:40 PM    INR 1.0 10/11/2012 03:40 PM       Assessment:     Dislodged/Removed RNUC.      Plan:     Planned Procedure:  DIscussed options with patient and daughter, her primary caregiver.  Will proceed with R Perc Neph/NephU placement today.  Sedation.      Risks, benefits, and alternatives reviewed with patient and she agrees to proceed with the procedure.      Signed By: Vladimir Creeks, MD     March 29, 2019

## 2019-03-31 LAB — CULTURE, URINE: Culture result:: >100000 — AB

## 2019-03-31 NOTE — Telephone Encounter (Signed)
Spoke with daughter. Patient was treated during nephrostomy tube replacement.

## 2019-03-31 NOTE — Telephone Encounter (Addendum)
Jarrett Soho called and LVM from micro lab in regards to UA culture results. Called Jarrett Soho back and she stated that pt had a procedure done during nephrostomy and urine culture indicated Klebsiella ESBL just incase you wanted to change abx she wanted to let us know. [phone]. Ty

## 2019-04-01 NOTE — Progress Notes (Signed)
Called patient to follow up on how she was doing after procedure earlier in the week.  No answer. Left message for her to call back.

## 2019-04-02 ENCOUNTER — Emergency Department: Admit: 2019-04-02 | Payer: MEDICARE | Primary: Community Health

## 2019-04-02 ENCOUNTER — Inpatient Hospital Stay
Admit: 2019-04-02 | Discharge: 2019-04-07 | Disposition: A | Discharge status: Skilled Nursing Facility | Payer: MEDICARE | Attending: Internal Medicine | Admitting: Internal Medicine

## 2019-04-02 ENCOUNTER — Emergency Department: Payer: MEDICARE | Primary: Community Health

## 2019-04-02 DIAGNOSIS — A419 Sepsis, unspecified organism: Secondary | ICD-10-CM

## 2019-04-02 LAB — METABOLIC PANEL, COMPREHENSIVE
A-G Ratio: 0.5 — ABNORMAL LOW (ref 1.2–3.5)
ALT (SGPT): 19 U/L (ref 12–65)
AST (SGOT): 30 U/L (ref 15–37)
Albumin: 2.5 g/dL — ABNORMAL LOW (ref 3.2–4.6)
Alk. phosphatase: 146 U/L — ABNORMAL HIGH (ref 50–136)
Anion gap: 7 mmol/L (ref 7–16)
BUN: 34 MG/DL — ABNORMAL HIGH (ref 8–23)
Bilirubin, total: 0.6 MG/DL (ref 0.2–1.1)
CO2: 24 mmol/L (ref 21–32)
Calcium: 8.5 MG/DL (ref 8.3–10.4)
Chloride: 103 mmol/L (ref 98–107)
Creatinine: 1.38 MG/DL — ABNORMAL HIGH (ref 0.6–1.0)
GFR est AA: 48 mL/min/{1.73_m2} — ABNORMAL LOW (ref >60)
GFR est non-AA: 40 mL/min/{1.73_m2} — ABNORMAL LOW (ref >60)
Globulin: 5.2 g/dL — ABNORMAL HIGH (ref 2.3–3.5)
Glucose: 119 mg/dL — ABNORMAL HIGH (ref 65–100)
Potassium: 4.5 mmol/L (ref 3.5–5.1)
Protein, total: 7.7 g/dL (ref 6.3–8.2)
Sodium: 134 mmol/L — ABNORMAL LOW (ref 136–145)

## 2019-04-02 LAB — URINALYSIS W/ RFLX MICROSCOPIC
Bacteria: 0 /hpf
Bilirubin: NEGATIVE
Epithelial cells: 0 /hpf
Glucose: NEGATIVE mg/dL
Nitrites: NEGATIVE
Protein: 100 mg/dL — AB
Specific gravity: 1.013 (ref 1.001–1.023)
Urobilinogen: 1 EU/dL (ref 0.2–1.0)
WBC: 0 /hpf
pH (UA): 7 (ref 5.0–9.0)

## 2019-04-02 LAB — LACTIC ACID: Lactic acid: 2.4 MMOL/L — CR (ref 0.4–2.0)

## 2019-04-02 LAB — CBC WITH AUTOMATED DIFF
ABS. BASOPHILS: 0 10*3/uL (ref 0.0–0.2)
ABS. EOSINOPHILS: 0.1 10*3/uL (ref 0.0–0.8)
ABS. IMM. GRANS.: 0.1 10*3/uL (ref 0.0–0.5)
ABS. LYMPHOCYTES: 1.5 10*3/uL (ref 0.5–4.6)
ABS. MONOCYTES: 1.4 10*3/uL — ABNORMAL HIGH (ref 0.1–1.3)
ABS. NEUTROPHILS: 6.9 10*3/uL (ref 1.7–8.2)
ABSOLUTE NRBC: 0 10*3/uL (ref 0.0–0.2)
BASOPHILS: 0 % (ref 0.0–2.0)
EOSINOPHILS: 1 % (ref 0.5–7.8)
HCT: 39.8 % (ref 35.8–46.3)
HGB: 12.7 g/dL (ref 11.7–15.4)
IMMATURE GRANULOCYTES: 1 % (ref 0.0–5.0)
LYMPHOCYTES: 15 % (ref 13–44)
MCH: 29.6 PG (ref 26.1–32.9)
MCHC: 31.9 g/dL (ref 31.4–35.0)
MCV: 92.8 FL (ref 79.6–97.8)
MONOCYTES: 14 % — ABNORMAL HIGH (ref 4.0–12.0)
MPV: 10.3 FL (ref 9.4–12.3)
NEUTROPHILS: 70 % (ref 43–78)
PLATELET: 130 10*3/uL — ABNORMAL LOW (ref 150–450)
RBC: 4.29 M/uL (ref 4.05–5.2)
RDW: 13.3 % (ref 11.9–14.6)
WBC: 9.9 10*3/uL (ref 4.3–11.1)

## 2019-04-02 LAB — PROCALCITONIN: Procalcitonin: 0.15 ng/mL

## 2019-04-02 MED ORDER — SODIUM CHLORIDE 0.9% BOLUS IV
0.9 % | Freq: Once | INTRAVENOUS | Status: AC
Start: 2019-04-02 — End: 2019-04-02
  Administered 2019-04-02: 21:00:00 via INTRAVENOUS

## 2019-04-02 MED ORDER — CEFTRIAXONE 1 GRAM SOLUTION FOR INJECTION
1 gram | INTRAMUSCULAR | Status: AC
Start: 2019-04-02 — End: 2019-04-02
  Administered 2019-04-02: 22:00:00 via INTRAVENOUS

## 2019-04-02 MED FILL — CEFTRIAXONE 1 GRAM SOLUTION FOR INJECTION: 1 gram | INTRAMUSCULAR | Qty: 1

## 2019-04-02 NOTE — ED Provider Notes (Signed)
73 year old female with history of dementia, hypertension, hypothyroidism, bladder cancer and resultant nephroureteral catheter with ileal conduit who arrives via EMS with reported decline over the past several weeks.  Pt s/p percutaneous Nephro-Ureteral Tube Placement on 03/29/19. According to daughter patient has been having worsening altered mental status and diffuse generalized weakness over the past several weeks.  Daughter states that patient has been frequently falling out of bed although she is usually able to walk around on her own according to report.  Denies cough, shortness of breath, chest pain, dental pain, vomiting, diarrhea, rash.    The history is provided by the EMS personnel. The history is limited by the condition of the patient. No language interpreter was used.   Altered mental status    This is a chronic problem. The current episode started more than 1 week ago. The problem has not changed since onset.Associated symptoms include confusion. Risk factors include dementia.   Fall   Pertinent negatives include no fever, no abdominal pain, no nausea and no vomiting.        Past Medical History:   Diagnosis Date   ??? Abdominal pain, unspecified site 10/27/2012   ??? Arthritis     generalized   ??? Asthma 10/27/2012   ??? Bladder cancer ([place])    ??? Depression     managed with medication    ??? Dysuria 10/27/2012   ??? Environmental allergies     managed with medication    ??? GERD (gastroesophageal reflux disease) 10/27/2012   ??? History of kidney stones     states surgical removed   ??? Hypothyroidism     managed with medication    ??? Other "heavy-for-dates" infants     ureterectomy and ileal conduit   ??? Unspecified disorder of bladder 10/27/2012   ??? Urethral caruncle 10/27/2012   ??? Urinary frequency 10/27/2012   ??? Urinary tract infection, site not specified 10/27/2012       Past Surgical History:   Procedure Laterality Date   ??? CLOSE CYSTOSTOMY      cystoscopy/ turbt x 2   ??? HX BREAST BIOPSY Left 2000    x 2    ??? HX CESAREAN SECTION      x 1   ??? HX CHOLECYSTECTOMY     ??? HX NEPHROSTOMY  2014   ??? HX TUBAL LIGATION     ??? HX WRIST FRACTURE TX Left    ??? IR CHANGE INTERNAL URETERAL STENT SI  02/15/2018   ??? IR CHANGE INTERNAL URETERAL STENT SI  04/15/2018   ??? IR CHANGE INTERNAL URETERAL STENT SI  06/14/2018   ??? IR CHANGE URET TUBE VIA ILEAL CONDUIT  09/07/2018   ??? IR CHANGE URET TUBE VIA ILEAL CONDUIT  12/20/2018   ??? IR CHANGE URET TUBE VIA ILEAL CONDUIT  03/21/2019   ??? IR CONVERT NEPHRO PERC RT TO NEPHROURETERAL CATH EXISTING SI  11/12/2017   ??? IR EXCHANGE NEPHRO PERC RT SI  10/23/2017   ??? IR NEPHROURETERAL PERC RT PLC CATH NEW ACCESS  SI  03/29/2019         Family History:   Problem Relation Age of Onset   ??? Stroke Mother    ??? Heart Disease Mother    ??? Cancer Father    ??? Asthma Father    ??? Lung Disease Father         COPD/smoker   ??? Cancer Brother    ??? Heart Disease Brother    ??? Alcohol abuse Brother  Social History     Socioeconomic History   ??? Marital status: WIDOWED     Spouse name: Not on file   ??? Number of children: Not on file   ??? Years of education: Not on file   ??? Highest education level: Not on file   Occupational History   ??? Not on file   Social Needs   ??? Financial resource strain: Not on file   ??? Food insecurity     Worry: Not on file     Inability: Not on file   ??? Transportation needs     Medical: Not on file     Non-medical: Not on file   Tobacco Use   ??? Smoking status: Former Smoker     Packs/day: 0.50     Years: 20.00     Pack years: 10.00   ??? Smokeless tobacco: Never Used   ??? Tobacco comment: stopped at age 16   Substance and Sexual Activity   ??? Alcohol use: No   ??? Drug use: No   ??? Sexual activity: Never   Lifestyle   ??? Physical activity     Days per week: Not on file     Minutes per session: Not on file   ??? Stress: Not on file   Relationships   ??? Social Product manager on phone: Not on file     Gets together: Not on file     Attends religious service: Not on file      Active member of club or organization: Not on file     Attends meetings of clubs or organizations: Not on file     Relationship status: Not on file   ??? Intimate partner violence     Fear of current or ex partner: Not on file     Emotionally abused: Not on file     Physically abused: Not on file     Forced sexual activity: Not on file   Other Topics Concern   ??? Not on file   Social History Narrative   ??? Not on file         ALLERGIES: Patient has no known allergies.    Review of Systems   Unable to perform ROS: Dementia   Constitutional: Positive for fatigue. Negative for chills, diaphoresis and fever.   HENT: Negative for congestion and rhinorrhea.    Respiratory: Negative for cough and shortness of breath.    Cardiovascular: Negative for chest pain.   Gastrointestinal: Negative for abdominal pain, diarrhea, nausea and vomiting.   Musculoskeletal: Negative for arthralgias, neck pain and neck stiffness.   Skin: Negative for rash.   Neurological: Negative for dizziness, syncope and light-headedness.   Psychiatric/Behavioral: Positive for confusion.       Vitals:    04/02/19 1621   BP: 96/52   Pulse: 98   Resp: 22   Temp: 97.9 ??F (36.6 ??C)   SpO2: 94%   Weight: 49.9 kg (110 lb)   Height: '5\' 2"'  (1.575 m)            Physical Exam  Vitals signs and nursing note reviewed.   Constitutional:       Comments: Altered.  Demented.   HENT:      Head: Normocephalic and atraumatic.   Eyes:      Extraocular Movements: Extraocular movements intact.      Pupils: Pupils are equal, round, and reactive to light.   Neck:  Comments: No nuchal rigidity.  Cardiovascular:      Rate and Rhythm: Normal rate and regular rhythm.      Heart sounds: Normal heart sounds.   Pulmonary:      Effort: Pulmonary effort is normal.      Breath sounds: Normal breath sounds.      Comments: CTAB.  Abdominal:      Palpations: Abdomen is soft. There is no mass.      Tenderness: There is no abdominal tenderness. There is no guarding.       Comments: Soft. NTND.  Ileal conduit draining yellow urine. No peritoneal signs. R nephrostomy tube present; c/d/i. No surrounding erythema or evidence of cellulitis.    Skin:     General: Skin is warm.   Neurological:      Mental Status: She is alert.      Comments: Demented.  No focal deficits.  No meningeal signs.          MDM  Number of Diagnoses or Management Options  Altered mental status, unspecified altered mental status type: new and requires workup  Dementia without behavioral disturbance, unspecified dementia type [facility]): new and requires workup  Diagnosis management comments: BP low on arrival.  Pt given 500 cc NS IVF.  CXR clear. WBC normal. Lactic Acid 2.4.  Blood cx obtained.  Spoke with daughter who states that she is unable to care for her at this time.  Will consult hospitalist for admission at this time.       Amount and/or Complexity of Data Reviewed  Clinical lab tests: ordered and reviewed  Tests in the radiology section of CPT??: ordered and reviewed  Tests in the medicine section of CPT??: ordered and reviewed  Review and summarize past medical records: yes  Discuss the patient with other providers: yes  Independent visualization of images, tracings, or specimens: yes    Risk of Complications, Morbidity, and/or Mortality  Presenting problems: moderate  Diagnostic procedures: moderate  Management options: moderate    Patient Progress  Patient progress: stable    ED Course as of Apr 01 1824   Sat Apr 02, 2019   1756 CXR IMPRESSION:  Negative for acute change.    [DF]      ED Course User Index  [DF] Flowers, Gissella Niblack Rich Reining., MD       Procedures    Results Include:    Recent Results (from the past 24 hour(s))   URINALYSIS W/ RFLX MICROSCOPIC    Collection Time: 04/02/19  4:29 PM   Result Value Ref Range    Color AMBER      Appearance TURBID      Specific gravity 1.013 1.001 - 1.023      pH (UA) 7.0 5.0 - 9.0      Protein 100 (A) NEG mg/dL    Glucose Negative mg/dL    Ketone TRACE (A) NEG mg/dL     Bilirubin Negative NEG      Blood LARGE (A) NEG      Urobilinogen 1.0 0.2 - 1.0 EU/dL    Nitrites Negative NEG      Leukocyte Esterase LARGE (A) NEG      WBC 0 0 /hpf    RBC 0-3 0 /hpf    Epithelial cells 0 0 /hpf    Bacteria 0 0 /hpf    Casts 0-3 0 /lpf   LACTIC ACID    Collection Time: 04/02/19  4:55 PM   Result Value Ref Range  Lactic acid 2.4 (HH) 0.4 - 2.0 MMOL/L   CBC WITH AUTOMATED DIFF    Collection Time: 04/02/19  4:56 PM   Result Value Ref Range    WBC 9.9 4.3 - 11.1 K/uL    RBC 4.29 4.05 - 5.2 M/uL    HGB 12.7 11.7 - 15.4 g/dL    HCT 39.8 35.8 - 46.3 %    MCV 92.8 79.6 - 97.8 FL    MCH 29.6 26.1 - 32.9 PG    MCHC 31.9 31.4 - 35.0 g/dL    RDW 13.3 11.9 - 14.6 %    PLATELET 130 (L) 150 - 450 K/uL    MPV 10.3 9.4 - 12.3 FL    ABSOLUTE NRBC 0.00 0.0 - 0.2 K/uL    DF AUTOMATED      NEUTROPHILS 70 43 - 78 %    LYMPHOCYTES 15 13 - 44 %    MONOCYTES 14 (H) 4.0 - 12.0 %    EOSINOPHILS 1 0.5 - 7.8 %    BASOPHILS 0 0.0 - 2.0 %    IMMATURE GRANULOCYTES 1 0.0 - 5.0 %    ABS. NEUTROPHILS 6.9 1.7 - 8.2 K/UL    ABS. LYMPHOCYTES 1.5 0.5 - 4.6 K/UL    ABS. MONOCYTES 1.4 (H) 0.1 - 1.3 K/UL    ABS. EOSINOPHILS 0.1 0.0 - 0.8 K/UL    ABS. BASOPHILS 0.0 0.0 - 0.2 K/UL    ABS. IMM. GRANS. 0.1 0.0 - 0.5 K/UL   METABOLIC PANEL, COMPREHENSIVE    Collection Time: 04/02/19  4:56 PM   Result Value Ref Range    Sodium 134 (L) 136 - 145 mmol/L    Potassium 4.5 3.5 - 5.1 mmol/L    Chloride 103 98 - 107 mmol/L    CO2 24 21 - 32 mmol/L    Anion gap 7 7 - 16 mmol/L    Glucose 119 (H) 65 - 100 mg/dL    BUN 34 (H) 8 - 23 MG/DL    Creatinine 1.38 (H) 0.6 - 1.0 MG/DL    GFR est AA 48 (L) >60 ml/min/1.2m    GFR est non-AA 40 (L) >60 ml/min/1.769m   Calcium 8.5 8.3 - 10.4 MG/DL    Bilirubin, total 0.6 0.2 - 1.1 MG/DL    ALT (SGPT) 19 12 - 65 U/L    AST (SGOT) 30 15 - 37 U/L    Alk. phosphatase 146 (H) 50 - 136 U/L    Protein, total 7.7 6.3 - 8.2 g/dL    Albumin 2.5 (L) 3.2 - 4.6 g/dL    Globulin 5.2 (H) 2.3 - 3.5 g/dL     A-G Ratio 0.5 (L) 1.2 - 3.5     PROCALCITONIN    Collection Time: 04/02/19  4:56 PM   Result Value Ref Range    Procalcitonin 0.15 ng/mL                    Ellora Varnum Steger FlShirlean Schlein MD; 04/02/2019 '@5' :40 PM Voice dictation software was used during the making of this note.  This software is not perfect and grammatical and other typographical errors may be present.  This note has not been proofread for errors.  ===================================================================

## 2019-04-02 NOTE — H&P (Signed)
Hospitalist H&P/Consult Note     Admit Date:  04/02/2019  4:16 PM   Name:  Cathy Jackson   Age:  73 y.o.  DOB:  1946-07-09   MRN:  [id number]   PCP:  Wilfred Lacy, NP  Treatment Team: Attending Provider: [place] Campbell, MD    HPI:   Patient is a 73 y/o female with hx frontotemporal dementia, bladder cancer, s/p ileal conduit, s/p nephro-ureteral tube placement 03/29/19, hypothyroid, gerd, UTI who presents from home with worsening weakness, falling out of bed, worsening behavior, and now disoriented x 4. Patient cannot provide any meaningful history. Blood pressure initially low in ED 91/50. Responded to IV fluids. UA does not appear infected. Patient is afebrile. Has a lactic acid of 2.4. procalcitonin 0.15. WBC ct only 9.9. Bun 34/Cr 1.38. TSH 1.870. head CT is negative. Chest xray is negative. Hospitalist service consulted for admission for AKI and acute encephalopathy.     10 systems reviewed and negative except as noted in HPI. Patient cannot provide  Past Medical History:   Diagnosis Date   ??? Abdominal pain, unspecified site 10/27/2012   ??? Arthritis     generalized   ??? Asthma 10/27/2012   ??? Bladder cancer ([place])    ??? Depression     managed with medication    ??? Dysuria 10/27/2012   ??? Environmental allergies     managed with medication    ??? GERD (gastroesophageal reflux disease) 10/27/2012   ??? History of kidney stones     states surgical removed   ??? Hypothyroidism     managed with medication    ??? Other "heavy-for-dates" infants     ureterectomy and ileal conduit   ??? Unspecified disorder of bladder 10/27/2012   ??? Urethral caruncle 10/27/2012   ??? Urinary frequency 10/27/2012   ??? Urinary tract infection, site not specified 10/27/2012      Past Surgical History:   Procedure Laterality Date   ??? CLOSE CYSTOSTOMY      cystoscopy/ turbt x 2   ??? HX BREAST BIOPSY Left 2000    x 2   ??? HX CESAREAN SECTION      x 1   ??? HX CHOLECYSTECTOMY     ??? HX NEPHROSTOMY  2014   ??? HX TUBAL LIGATION     ??? HX WRIST FRACTURE TX Left     ??? IR CHANGE INTERNAL URETERAL STENT SI  02/15/2018   ??? IR CHANGE INTERNAL URETERAL STENT SI  04/15/2018   ??? IR CHANGE INTERNAL URETERAL STENT SI  06/14/2018   ??? IR CHANGE URET TUBE VIA ILEAL CONDUIT  09/07/2018   ??? IR CHANGE URET TUBE VIA ILEAL CONDUIT  12/20/2018   ??? IR CHANGE URET TUBE VIA ILEAL CONDUIT  03/21/2019   ??? IR CONVERT NEPHRO PERC RT TO NEPHROURETERAL CATH EXISTING SI  11/12/2017   ??? IR EXCHANGE NEPHRO PERC RT SI  10/23/2017   ??? IR NEPHROURETERAL PERC RT PLC CATH NEW ACCESS  SI  03/29/2019      Prior to Admission Medications   Prescriptions Last Dose Informant Patient Reported? Taking?   PARoxetine (PAXIL) 20 mg tablet   No No   Sig: Take 1 Tab by mouth daily.   ascorbic acid, vitamin C, (VITAMIN C) 1,000 mg tablet   Yes No   Sig: Take  by mouth.   calcium carb-vitamin D3-vit K2 600 mg-1,000 unit-90 mcg tab   Yes No   Sig: Take  by mouth.  calcium carbonate (OS-CAL) 500 mg calcium (1,250 mg) tablet   Yes No   Sig: Take  by mouth daily.   divalproex ER (DEPAKOTE ER) 500 mg ER tablet   Yes No   Sig: Take 500 mg by mouth.   donepezil (ARICEPT) 10 mg tablet   No No   Sig: Take 1 Tab by mouth nightly.   famotidine (PEPCID) 20 mg tablet   Yes No   Sig: Take 20 mg by mouth.   ferrous sulfate (IRON) 325 mg (65 mg iron) tablet   Yes No   Sig: Take  by mouth Daily (before breakfast).   food supplemt, lactose-reduced (ENSURE ACTIVE CLEAR) liqd   No No   Sig: Take 237 mL by mouth four (4) times daily.   methenamine hippurate (HIPREX) 1 gram tablet   No No   Sig: Take 1 Tab by mouth two (2) times daily (with meals).   senna-docusate (PERICOLACE) 8.6-50 mg per tablet   Yes No   Sig: Take  by mouth.   simvastatin (ZOCOR) 20 mg tablet   No No   Sig: TAKE 1 TABLET BY MOUTH EVERY DAY AT NIGHT   traZODone (DESYREL) 50 mg tablet   Yes No   Sig: Take  by mouth nightly.      Facility-Administered Medications: None     No Known Allergies   Social History     Tobacco Use   ??? Smoking status: Former Smoker     Packs/day: 0.50      Years: 20.00     Pack years: 10.00   ??? Smokeless tobacco: Never Used   ??? Tobacco comment: stopped at age 78   Substance Use Topics   ??? Alcohol use: No      Family History   Problem Relation Age of Onset   ??? Stroke Mother    ??? Heart Disease Mother    ??? Cancer Father    ??? Asthma Father    ??? Lung Disease Father         COPD/smoker   ??? Cancer Brother    ??? Heart Disease Brother    ??? Alcohol abuse Brother       Immunization History   Administered Date(s) Administered   ??? Influenza Vaccine 02/25/2011   ??? TB Skin Test (PPD) Intradermal 04/02/2019       Objective:     Patient Vitals for the past 24 hrs:   Temp Pulse Resp BP SpO2   04/02/19 2302 (P) 98.2 ??F (36.8 ??C) 87 (P) 20 91/50 (P) 95 %   04/02/19 2040 ??? ??? ??? 110/55 ???   04/02/19 2021 ??? ??? ??? 118/67 ???   04/02/19 1901 ??? 92 ??? 136/63 98 %   04/02/19 1712 ??? 97 ??? ??? 95 %   04/02/19 1703 ??? ??? ??? 123/58 ???   04/02/19 1621 97.9 ??F (36.6 ??C) 98 22 96/52 94 %     Oxygen Therapy  O2 Sat (%): (P) 95 % (04/02/19 2302)  Pulse via Oximetry: 92 beats per minute (04/02/19 1901)  O2 Device: Room air (04/02/19 1621)    Intake/Output Summary (Last 24 hours) at 04/03/2019 0144  Last data filed at 04/02/2019 2237  Gross per 24 hour   Intake 550 ml   Output 200 ml   Net 350 ml       Physical Exam:  General:    Cachectic, confused, edentulous   Eyes:   Normal sclera.  Extraocular movements intact.  ENT:  Normocephalic, atraumatic. dry mucous membranes  CV:   RRR.  No murmur, rub, or gallop.    Lungs:  CTAB.  No wheezing, rhonchi, or rales.  Abdomen: Soft, ileal conduit draining. R nephrostomy tube  Extremities: Warm and dry.  No cyanosis or edema.  Neurologic: CN II-XII grossly intact.  Sensation intact.  Skin:     No rashes or jaundice.  No wounds.    Psych:  Dementia and Disoriented x 4    I reviewed the labs, imaging, EKGs, telemetry, and other studies done this admission.  Data Review:   Recent Results (from the past 24 hour(s))   URINALYSIS W/ RFLX MICROSCOPIC    Collection Time: 04/02/19  4:29 PM    Result Value Ref Range    Color AMBER      Appearance TURBID      Specific gravity 1.013 1.001 - 1.023      pH (UA) 7.0 5.0 - 9.0      Protein 100 (A) NEG mg/dL    Glucose Negative mg/dL    Ketone TRACE (A) NEG mg/dL    Bilirubin Negative NEG      Blood LARGE (A) NEG      Urobilinogen 1.0 0.2 - 1.0 EU/dL    Nitrites Negative NEG      Leukocyte Esterase LARGE (A) NEG      WBC 0 0 /hpf    RBC 0-3 0 /hpf    Epithelial cells 0 0 /hpf    Bacteria 0 0 /hpf    Casts 0-3 0 /lpf   LACTIC ACID    Collection Time: 04/02/19  4:55 PM   Result Value Ref Range    Lactic acid 2.4 (HH) 0.4 - 2.0 MMOL/L   CBC WITH AUTOMATED DIFF    Collection Time: 04/02/19  4:56 PM   Result Value Ref Range    WBC 9.9 4.3 - 11.1 K/uL    RBC 4.29 4.05 - 5.2 M/uL    HGB 12.7 11.7 - 15.4 g/dL    HCT 39.8 35.8 - 46.3 %    MCV 92.8 79.6 - 97.8 FL    MCH 29.6 26.1 - 32.9 PG    MCHC 31.9 31.4 - 35.0 g/dL    RDW 13.3 11.9 - 14.6 %    PLATELET 130 (L) 150 - 450 K/uL    MPV 10.3 9.4 - 12.3 FL    ABSOLUTE NRBC 0.00 0.0 - 0.2 K/uL    DF AUTOMATED      NEUTROPHILS 70 43 - 78 %    LYMPHOCYTES 15 13 - 44 %    MONOCYTES 14 (H) 4.0 - 12.0 %    EOSINOPHILS 1 0.5 - 7.8 %    BASOPHILS 0 0.0 - 2.0 %    IMMATURE GRANULOCYTES 1 0.0 - 5.0 %    ABS. NEUTROPHILS 6.9 1.7 - 8.2 K/UL    ABS. LYMPHOCYTES 1.5 0.5 - 4.6 K/UL    ABS. MONOCYTES 1.4 (H) 0.1 - 1.3 K/UL    ABS. EOSINOPHILS 0.1 0.0 - 0.8 K/UL    ABS. BASOPHILS 0.0 0.0 - 0.2 K/UL    ABS. IMM. GRANS. 0.1 0.0 - 0.5 K/UL   METABOLIC PANEL, COMPREHENSIVE    Collection Time: 04/02/19  4:56 PM   Result Value Ref Range    Sodium 134 (L) 136 - 145 mmol/L    Potassium 4.5 3.5 - 5.1 mmol/L    Chloride 103 98 - 107 mmol/L    CO2 24 21 - 32  mmol/L    Anion gap 7 7 - 16 mmol/L    Glucose 119 (H) 65 - 100 mg/dL    BUN 34 (H) 8 - 23 MG/DL    Creatinine 1.38 (H) 0.6 - 1.0 MG/DL    GFR est AA 48 (L) >60 ml/min/1.11m    GFR est non-AA 40 (L) >60 ml/min/1.765m   Calcium 8.5 8.3 - 10.4 MG/DL    Bilirubin, total 0.6 0.2 - 1.1 MG/DL     ALT (SGPT) 19 12 - 65 U/L    AST (SGOT) 30 15 - 37 U/L    Alk. phosphatase 146 (H) 50 - 136 U/L    Protein, total 7.7 6.3 - 8.2 g/dL    Albumin 2.5 (L) 3.2 - 4.6 g/dL    Globulin 5.2 (H) 2.3 - 3.5 g/dL    A-G Ratio 0.5 (L) 1.2 - 3.5     PROCALCITONIN    Collection Time: 04/02/19  4:56 PM   Result Value Ref Range    Procalcitonin 0.15 ng/mL   TSH 3RD GENERATION    Collection Time: 04/02/19  4:56 PM   Result Value Ref Range    TSH 1.870 0.358 - 3.740 uIU/mL       Imaging Studies:  CXR Results  (Last 48 hours)               04/02/19 1734  XR CHEST PORT Final result    Impression:  IMPRESSION:  Negative for acute change.                   Narrative:  CHEST X-RAY, one view.       HISTORY:  Altered mental status.        TECHNIQUE:  AP upright portable view.       COMPARISON: 29 Dec 2017       FINDINGS:      -The lungs: are clear.    -The heart size: is normal.    -The costophrenic angles: are sharp.    -The pulmonary vasculature: is unremarkable.    -Included portion of the upper abdomen: is unremarkable.    -Bones: No gross bony lesions.   -Other: None.                CT Results  (Last 48 hours)               04/02/19 1847  CT HEAD WO CONT Final result    Impression:  IMPRESSION:  Negative for acute intracranial abnormality.        Narrative:  CT HEAD WITHOUT CONTRAST.       INDICATION: Altered mental status       COMPARISON: None.         TECHNIQUE:   5 mm axial scans from the skull base to the vertex.  Our CT   scanners use one or more of the following:  Automated exposure control,   adjustment of the mA and or kV according to patient size, iterative   reconstruction.       FINDINGS:     No acute intraparenchymal hemorrhage or abnormal extra-axial fluid collection.     The ventricles are normal size.     No midline shift or mass effect.   Included portion of the paranasal sinuses and orbits grossly unremarkable.                 Assessment and Plan:     Hospital Problems as of 04/03/2019 Date Reviewed:  06/01/2018           Codes Class Noted - Resolved POA    * (Principal) Altered mental status, unspecified ICD-10-CM: R41.82  ICD-9-CM: 780.97  04/02/2019 - Present Yes        Hypotension ICD-10-CM: I95.9  ICD-9-CM: 458.9  04/02/2019 - Present Yes        AKI (acute kidney injury) ([place]) ICD-10-CM: N17.9  ICD-9-CM: 584.9  04/02/2019 - Present Yes        Frontotemporal dementia ([place]) ICD-10-CM: G31.09, F02.80  ICD-9-CM: 331.19  02/01/2015 - Present Yes    Overview Signed 10/20/2017 10:25 AM by Wilfred Lacy, NP     Last Assessment & Plan:   Disinhibited, agitated, aggressive, violent at times. Will add depakote to try to calm her behaviors down. Might need to adjust namenda, aricept, and paxil, too but will start with depakote. Provided information about the drug, anticipated effects, and possible side effects. Family will call me next week and let me know how she is doing, we may need to increase the dose, but since she is small, we will need to monitor for fall risk.     Daughter inquired about PACE program. She isnt medicaid eligible, I dont think, but out of pocket might be an option for daytime care. She is also going to look into senior day programs. I will refer her daughter to the [employer] for additional resources. Might need to send daughter REACH at next appt, too.              Acquired hypothyroidism ICD-10-CM: E03.9  ICD-9-CM: 244.9  08/29/2015 - Present Yes        Depression with anxiety ICD-10-CM: F41.8  ICD-9-CM: 300.4  02/01/2015 - Present Yes    Overview Signed 10/20/2017 10:25 AM by Wilfred Lacy, NP     Last Assessment & Plan:   History of depression, anxiety, and agitation. Currently taking Celexa 20 mg- for about 3-4 years. Tolerating well and denies ADEs. Plan: Continue Celexa 20 mg daily.              Asthma ICD-10-CM: J45.909  ICD-9-CM: 493.90  10/27/2012 - Present Yes        GERD without esophagitis ICD-10-CM: K21.9  ICD-9-CM: 530.81  10/27/2012 - Present Yes              PLAN:   ?? Admit to medical bed. Bedrest tonight as fall risk  ?? Assist patient in ambulation in am if able  ?? Suspect AMS due to progression of her frontotemporal dementia  ?? Her urine does not appear to be infected  ?? Will trend lactic acid. Mild elevation may have been from hypotension  ?? Hypotension and AKI from dehydration  ?? Continue with IV fluids. Recheck BMP in am.   ?? See if she will eat. Appears malnourished  ?? Consult nutritional services  ?? Use Boost/Ensure 3-4 x daily  ?? TSH is normal. Do not see synthroid on home med list  ?? Continue present aricept, paxil, depakote ER and trazodone  ?? PT/OT consults. May need placement  ?? Place PPD. Consult case management  ?? SQ lovenox for DVT prophylaxis      Estimated LOS:  2 midnights    Signed:  Bron[place] Campbell, MD

## 2019-04-02 NOTE — Progress Notes (Addendum)
TRANSFER - IN REPORT:    Verbal report received from [place]) on Terez Montee  being received from er(unit) for routine progression of care      Report consisted of patient???s Situation, Background, Assessment and   Recommendations(SBAR).     Information from the following report(s) ED Summary was reviewed with the receiving nurse.    Opportunity for questions and clarification was provided.      Assessment completed upon patient???s arrival to unit and care assumed.       Arrived awake, pleasantly confused. HOH. Pt unable to answer questions. No med list with pt. Has right lower quadrant urostomy draining cloudy yellow urine.Cannot see stoma due to sediment.  Has right falnk nephrotomy tube with drsg falling off; insertion site has no redness.swelling,drainage. New DSD applied and taped. Tube is clamped /closed off; not attached to a drainage bag. Coccyx area scantly pink/ no skin breakdown; allevyn applied. Has few old healing scattered bruises.

## 2019-04-02 NOTE — ED Triage Notes (Signed)
Pt arrives via EMS from home with mask. Daughter reports patient has been "declining over the past few weeks". Pt has kidney bag for urine with a nephro tube, and had surgery this past Wednesday to "reattach" the tube and discovered at this time she had a UTI. Pt has been falling out of her bed although she usually is able to walk around on her own. Pt has been AMS slowly declining over the past three weeks, although she has a history of demetia but is normally oriented but right now is alert and disorientedx4.

## 2019-04-02 NOTE — ED Notes (Signed)
Report given to Kia RN, pt care transferred at this time

## 2019-04-03 LAB — METABOLIC PANEL, COMPREHENSIVE
A-G Ratio: 0.5 — ABNORMAL LOW (ref 1.2–3.5)
ALT (SGPT): 14 U/L (ref 12–65)
AST (SGOT): 31 U/L (ref 15–37)
Albumin: 2.1 g/dL — ABNORMAL LOW (ref 3.2–4.6)
Alk. phosphatase: 57 U/L (ref 50–136)
Anion gap: 12 mmol/L (ref 7–16)
BUN: 24 MG/DL — ABNORMAL HIGH (ref 8–23)
Bilirubin, total: 0.4 MG/DL (ref 0.2–1.1)
CO2: 20 mmol/L — ABNORMAL LOW (ref 21–32)
Calcium: 8.1 MG/DL — ABNORMAL LOW (ref 8.3–10.4)
Chloride: 107 mmol/L (ref 98–107)
Creatinine: 0.91 MG/DL (ref 0.6–1.0)
GFR est AA: >60 mL/min/{1.73_m2} (ref >60)
GFR est non-AA: >60 mL/min/{1.73_m2} (ref >60)
Globulin: 4.3 g/dL — ABNORMAL HIGH (ref 2.3–3.5)
Glucose: 92 mg/dL (ref 65–100)
Potassium: 3.8 mmol/L (ref 3.5–5.1)
Protein, total: 6.4 g/dL (ref 6.3–8.2)
Sodium: 139 mmol/L (ref 136–145)

## 2019-04-03 LAB — LACTIC ACID: Lactic acid: 1.5 MMOL/L (ref 0.4–2.0)

## 2019-04-03 LAB — MAGNESIUM: Magnesium: 1.8 mg/dL (ref 1.8–2.4)

## 2019-04-03 LAB — TSH 3RD GENERATION: TSH: 1.87 u[IU]/mL (ref 0.358–3.740)

## 2019-04-03 MED ORDER — ONDANSETRON (PF) 4 MG/2 ML INJECTION
4 mg/2 mL | Freq: Four times a day (QID) | INTRAMUSCULAR | Status: DC | PRN
Start: 2019-04-03 — End: 2019-04-06

## 2019-04-03 MED ORDER — .PHARMACY TO SUBSTITUTE PER PROTOCOL
Status: DC | PRN
Start: 2019-04-03 — End: 2019-04-02

## 2019-04-03 MED ORDER — SENNOSIDES-DOCUSATE SODIUM 8.6 MG-50 MG TAB
Freq: Every day | ORAL | Status: DC
Start: 2019-04-03 — End: 2019-04-06
  Administered 2019-04-03 – 2019-04-06 (×4): via ORAL

## 2019-04-03 MED ORDER — DONEPEZIL 5 MG TAB
5 mg | Freq: Every evening | ORAL | Status: DC
Start: 2019-04-03 — End: 2019-04-06
  Administered 2019-04-03 – 2019-04-06 (×4): via ORAL

## 2019-04-03 MED ORDER — ENOXAPARIN 30 MG/0.3 ML SUB-Q SYRINGE
30 mg/0.3 mL | Freq: Every day | SUBCUTANEOUS | Status: DC
Start: 2019-04-03 — End: 2019-04-06
  Administered 2019-04-03 – 2019-04-06 (×4): via SUBCUTANEOUS

## 2019-04-03 MED ORDER — ACETAMINOPHEN 650 MG RECTAL SUPPOSITORY
650 mg | Freq: Four times a day (QID) | RECTAL | Status: DC | PRN
Start: 2019-04-03 — End: 2019-04-06

## 2019-04-03 MED ORDER — PAROXETINE 20 MG TAB
20 mg | Freq: Every day | ORAL | Status: DC
Start: 2019-04-03 — End: 2019-04-06
  Administered 2019-04-03 – 2019-04-06 (×4): via ORAL

## 2019-04-03 MED ORDER — TRAZODONE 50 MG TAB
50 mg | Freq: Every evening | ORAL | Status: DC
Start: 2019-04-03 — End: 2019-04-06
  Administered 2019-04-03 – 2019-04-06 (×4): via ORAL

## 2019-04-03 MED ORDER — CALCIUM CARBONATE 500 MG (1250 MG) TAB
500 mg calcium (1,250 mg) | Freq: Two times a day (BID) | ORAL | Status: DC
Start: 2019-04-03 — End: 2019-04-06
  Administered 2019-04-03 – 2019-04-06 (×8): via ORAL

## 2019-04-03 MED ORDER — SODIUM CHLORIDE 0.9 % IJ SYRG
Freq: Three times a day (TID) | INTRAMUSCULAR | Status: DC
Start: 2019-04-03 — End: 2019-04-06
  Administered 2019-04-03 – 2019-04-06 (×10): via INTRAVENOUS

## 2019-04-03 MED ORDER — SODIUM CHLORIDE 0.9 % IV
INTRAVENOUS | Status: DC
Start: 2019-04-03 — End: 2019-04-06
  Administered 2019-04-03 – 2019-04-06 (×5): via INTRAVENOUS

## 2019-04-03 MED ORDER — ASCORBIC ACID 500 MG TAB
500 mg | Freq: Every day | ORAL | Status: DC
Start: 2019-04-03 — End: 2019-04-06
  Administered 2019-04-03 – 2019-04-06 (×3): via ORAL

## 2019-04-03 MED ORDER — FERROUS SULFATE 325 MG (65 MG ELEMENTAL IRON) TAB
325 mg (65 mg iron) | Freq: Every day | ORAL | Status: DC
Start: 2019-04-03 — End: 2019-04-06
  Administered 2019-04-03 – 2019-04-06 (×4): via ORAL

## 2019-04-03 MED ORDER — POLYETHYLENE GLYCOL 3350 17 GRAM (100 %) ORAL POWDER PACKET
17 gram | Freq: Every day | ORAL | Status: DC | PRN
Start: 2019-04-03 — End: 2019-04-06

## 2019-04-03 MED ORDER — SODIUM CHLORIDE 0.9 % IJ SYRG
INTRAMUSCULAR | Status: DC | PRN
Start: 2019-04-03 — End: 2019-04-06

## 2019-04-03 MED ORDER — ACETAMINOPHEN 325 MG TABLET
325 mg | Freq: Four times a day (QID) | ORAL | Status: DC | PRN
Start: 2019-04-03 — End: 2019-04-06

## 2019-04-03 MED ORDER — METHENAMINE HIPPURATE 1 GRAM TAB
1 gram | Freq: Two times a day (BID) | ORAL | Status: DC
Start: 2019-04-03 — End: 2019-04-06

## 2019-04-03 MED ORDER — ATORVASTATIN 10 MG TAB
10 mg | Freq: Every evening | ORAL | Status: DC
Start: 2019-04-03 — End: 2019-04-06
  Administered 2019-04-03 – 2019-04-06 (×4): via ORAL

## 2019-04-03 MED ORDER — DIVALPROEX 500 MG 24 HR TAB
500 mg | Freq: Every evening | ORAL | Status: DC
Start: 2019-04-03 — End: 2019-04-06
  Administered 2019-04-03 – 2019-04-06 (×4): via ORAL

## 2019-04-03 MED ORDER — FAMOTIDINE 20 MG TAB
20 mg | Freq: Every day | ORAL | Status: DC
Start: 2019-04-03 — End: 2019-04-06
  Administered 2019-04-03 – 2019-04-06 (×4): via ORAL

## 2019-04-03 MED ORDER — TUBERCULIN PPD 5 UNIT/0.1 ML INTRADERMAL
5 tub. unit /0.1 mL | Freq: Once | INTRADERMAL | Status: AC
Start: 2019-04-03 — End: 2019-04-03

## 2019-04-03 MED ORDER — LEVOFLOXACIN IN D5W 500 MG/100 ML IV PIGGY BACK
500 mg/100 mL | INTRAVENOUS | Status: DC
Start: 2019-04-03 — End: 2019-04-06
  Administered 2019-04-03 – 2019-04-05 (×2): via INTRAVENOUS

## 2019-04-03 MED FILL — TRAZODONE 50 MG TAB: 50 mg | ORAL | Qty: 1

## 2019-04-03 MED FILL — VITAMIN C 500 MG TABLET: 500 mg | ORAL | Qty: 2

## 2019-04-03 MED FILL — LOVENOX 30 MG/0.3 ML SUB-Q SYRINGE: 30 mg/0.3 mL | SUBCUTANEOUS | Qty: 0.3

## 2019-04-03 MED FILL — DIVALPROEX 500 MG 24 HR TAB: 500 mg | ORAL | Qty: 1

## 2019-04-03 MED FILL — PAROXETINE 20 MG TAB: 20 mg | ORAL | Qty: 1

## 2019-04-03 MED FILL — OYSTER SHELL CALCIUM 500  500 MG CALCIUM (1,250 MG) TABLET: 500 mg calcium (1,250 mg) | ORAL | Qty: 1

## 2019-04-03 MED FILL — LEVOFLOXACIN IN D5W 500 MG/100 ML IV PIGGY BACK: 500 mg/100 mL | INTRAVENOUS | Qty: 100

## 2019-04-03 MED FILL — STOOL SOFTENER-STIMULANT LAXATIVE 8.6 MG-50 MG TABLET: ORAL | Qty: 1

## 2019-04-03 MED FILL — ATORVASTATIN 10 MG TAB: 10 mg | ORAL | Qty: 1

## 2019-04-03 MED FILL — DONEPEZIL 5 MG TAB: 5 mg | ORAL | Qty: 2

## 2019-04-03 MED FILL — SODIUM CHLORIDE 0.9 % IV: INTRAVENOUS | Qty: 1000

## 2019-04-03 MED FILL — FAMOTIDINE 20 MG TAB: 20 mg | ORAL | Qty: 1

## 2019-04-03 MED FILL — FERROUS SULFATE 325 MG (65 MG ELEMENTAL IRON) TAB: 325 mg (65 mg iron) | ORAL | Qty: 1

## 2019-04-03 NOTE — Progress Notes (Signed)
Problem: Mobility Impaired (Adult and Pediatric)  Goal: *Acute Goals and Plan of Care (Insert Text)  Outcome: Progressing Towards Goal  Note:     LTG:  (1.)Cathy Jackson will move from supine to sit and sit to supine, scoot up and down, and roll side to side with STAND BY ASSIST within 7 treatment day(s).    (2.)Cathy Jackson will transfer from bed to chair and chair to bed with CONTACT GUARD ASSIST using the least restrictive device within 7 treatment day(s).    (3.)Cathy Jackson will ambulate with MINIMAL ASSIST for 50+ feet with the least restrictive device within 7 treatment day(s).   (4.)Cathy Jackson will perform exercises per HEP for 10+ minutes to improve strength and mobility within 7 days.  ________________________________________________________________________________________________       PHYSICAL THERAPY: Initial Assessment and AM 04/03/2019  INPATIENT: PT Visit Days : 1  Payor: [employer] MEDICARE / Plan: [employer] MEDICARE ADVANTAGE / Product Type: Managed Care Medicare /       NAME/AGE/GENDER: Cathy Jackson is a 73 y.o. female   PRIMARY DIAGNOSIS: Altered mental status, unspecified [R41.82]  Hypotension [I95.9]  AKI (acute kidney injury) ([place]) [N17.9] Altered mental status, unspecified Altered mental status, unspecified        ICD-10: Treatment Diagnosis:    Generalized Muscle Weakness (M62.81)  Difficulty in walking, Not elsewhere classified (R26.2)  Other abnormalities of gait and mobility (R26.89)   Precaution/Allergies:  Patient has no known allergies.      ASSESSMENT:     Cathy Jackson is a 73 year old female admitted from home with altered mental status/encephalopathy, AKI. Has history of frontotemporal dementia. She presents oriented to self and DOB only and was unable to provide any meaningful history today. Unclear her home situation or prior level of mobility. Requires mod assist for bed mobility/transfer to sitting. Left trunk lean in sitting and needs mod assist for seated mobility,  positioning. Fair seated balance noted while working on static/dynamic functional activities at edge of bed; pt able to lean forward and essentially reach floor to don socks. Performed sit-stand transfers x many trials with mod-max assist each time and poor standing balance, strong left trunk lean. Worked on standing posture, balance, weight shifts, marching in place, and attempts at lateral steps during many standing trials. Unable to take >1-2 quality side steps today due to poor balance and decreased safety awareness. Returned to supine due to significant confusion and fall risk. Positioned comfortably with bed alarm on, RN notified. Cathy Jackson is curretly quite weak and debilitated and is limited by mental status. Unclear her baseline level of function. She will benefit from continued acute PT to address deficits/maximize independence with mobility. Anticipate need for rehab at dc.    This section established at most recent assessment   PROBLEM LIST (Impairments causing functional limitations):  Decreased Strength  Decreased ADL/Functional Activities  Decreased Transfer Abilities  Decreased Ambulation Ability/Technique  Decreased Balance  Decreased Activity Tolerance  Decreased Cognition   INTERVENTIONS PLANNED: (Benefits and precautions of physical therapy have been discussed with the patient.)  Balance Exercise  Bed Mobility  Gait Training  Home Exercise Program (HEP)  Therapeutic Activites  Therapeutic Exercise/Strengthening  Transfer Training     TREATMENT PLAN: Frequency/Duration: 3 times a week for duration of hospital stay  Rehabilitation Potential For Stated Goals: Good     REHAB RECOMMENDATIONS (at time of discharge pending progress):    Placement:  It is my opinion, based on this patient's performance to  date, that Cathy Jackson may benefit from intensive therapy at a [facility] after discharge due to the functional deficits listed above that are  likely to improve with skilled rehabilitation and concerns that he/she may be unsafe to be unsupervised at home due to weakness, need for heavy assist with all mobility, fall risk .  Equipment:   tbd              HISTORY:   History of Present Injury/Illness (Reason for Referral):  Per H&P, "Patient is a 73 y/o female with hx frontotemporal dementia, bladder cancer, s/p ileal conduit, s/p nephro-ureteral tube placement 03/29/19, hypothyroid, gerd, UTI who presents from home with worsening weakness, falling out of bed, worsening behavior, and now disoriented x 4. Patient cannot provide any meaningful history. Blood pressure initially low in ED 91/50. Responded to IV fluids. UA does not appear infected. Patient is afebrile. Has a lactic acid of 2.4. procalcitonin 0.15. WBC ct only 9.9. Bun 34/Cr 1.38. TSH 1.870. head CT is negative. Chest xray is negative. Hospitalist service consulted for admission for AKI and acute encephalopathy"    Past Medical History/Comorbidities:   Cathy Jackson  has a past medical history of Abdominal pain, unspecified site (10/27/2012), Arthritis, Asthma (10/27/2012), Bladder cancer ([place]), Depression, Dysuria (10/27/2012), Environmental allergies, GERD (gastroesophageal reflux disease) (10/27/2012), History of kidney stones, Hypothyroidism, Other "heavy-for-dates" infants, Unspecified disorder of bladder (10/27/2012), Urethral caruncle (10/27/2012), Urinary frequency (10/27/2012), and Urinary tract infection, site not specified (10/27/2012). She also has no past medical history of Difficult intubation, Malignant hyperthermia due to anesthesia, Nausea & vomiting, Pseudocholinesterase deficiency, or Unspecified adverse effect of anesthesia.  Cathy Jackson  has a past surgical history that includes hx breast biopsy (Left, 2000); hx cesarean section; hx tubal ligation; pr close cystostomy; hx wrist fracture tx (Left); hx cholecystectomy; hx nephrostomy (2014); ir exchange  nephro perc rt si (10/23/2017); ir convert nephro perc rt to nephroureteral cath existing si (11/12/2017); ir change internal ureteral stent si (02/15/2018); ir change internal ureteral stent si (04/15/2018); ir change internal ureteral stent si (06/14/2018); ir change uret tube via ileal conduit (09/07/2018); ir change uret tube via ileal conduit (12/20/2018); ir change uret tube via ileal conduit (03/21/2019); and ir nephroureteral perc rt plc cath new access  si (03/29/2019).  Social History/Living Environment:      Prior Level of Function/Work/Activity:  Unknown- no information in chart and pt confused/disoriented today, unable to answer questions or provide history.     Number of Personal Factors/Comorbidities that affect the Plan of Care: 3+: HIGH COMPLEXITY   EXAMINATION:   Most Recent Physical Functioning:   Gross Assessment:  AROM: Generally decreased, functional  Strength: Generally decreased, functional  Coordination: Generally decreased, functional               Posture:  Posture (WDL): Exceptions to WDL  Posture Assessment: Forward head, Rounded shoulders, Trunk flexion  Balance:  Sitting: Impaired  Sitting - Static: Fair (occasional)  Sitting - Dynamic: Fair (occasional)  Standing: Impaired  Standing - Static: Poor  Standing - Dynamic : Poor Bed Mobility:  Rolling: Moderate assistance  Supine to Sit: Moderate assistance  Sit to Supine: Moderate assistance  Scooting: Maximum assistance  Wheelchair Mobility:     Transfers:  Sit to Stand: Moderate assistance  Stand to Sit: Moderate assistance  Interventions: Verbal cues;Safety awareness training;Tactile cues;Manual cues  Duration: 8 Minutes  Gait:     Base of Support: Center of gravity altered  Speed/Cadence: Pace decreased (<100 feet/min);Slow  Step Length: Left shortened;Right shortened  Gait Abnormalities: Trunk sway increased;Decreased step clearance;Path deviations  Distance (ft): 2 Feet (ft)  Assistive Device: Other (comment)(mod handheld assist)   Ambulation - Level of Assistance: Moderate assistance  Interventions: Verbal cues;Visual/Demos;Safety awareness training;Tactile cues;Manual cues      Body Structures Involved:  Eyes and Ears  Muscles Body Functions Affected:  Mental  Neuromusculoskeletal  Movement Related Activities and Participation Affected:  General Tasks and Demands  Mobility  Domestic Life  Community, Social and [place]   Number of elements that affect the Plan of Care: 4+: HIGH COMPLEXITY   CLINICAL PRESENTATION:   Presentation: Evolving clinical presentation with unstable and unpredictable characteristics: HIGH COMPLEXITY   CLINICAL DECISION MAKING:   [facility]	[place] AM-PAC??? ???6 Clicks???   Basic Mobility Inpatient Short Form  How much difficulty does the patient currently have... Unable A Lot A Little None   1.  Turning over in bed (including adjusting bedclothes, sheets and blankets)?   []  1   [x]  2   []  3   []  4   2.  Sitting down on and standing up from a chair with arms ( e.g., wheelchair, bedside commode, etc.)   []  1   [x]  2   []  3   []  4   3.  Moving from lying on back to sitting on the side of the bed?   []  1   [x]  2   []  3   []  4   How much help from another person does the patient currently need... Total A Lot A Little None   4.  Moving to and from a bed to a chair (including a wheelchair)?   []  1   [x]  2   []  3   []  4   5.  Need to walk in hospital room?   [x]  1   []  2   []  3   []  4   6.  Climbing 3-5 steps with a railing?   [x]  1   []  2   []  3   []  4   ?? 2007, [employer], under license to [employer]. All rights reserved      Score:  Initial: 10 Most Recent: X (Date: -- )    Interpretation of Tool:  Represents activities that are increasingly more difficult (i.e. Bed mobility, Transfers, Gait).    Medical Necessity:     Patient demonstrates   fair   rehab potential due to higher previous functional level.  Reason for Services/Other Comments:  Patient    continues to demonstrate capacity to improve strength, mobility, balance, transfers, and activity tolerance which will   increase independence, decrease amount of assistance required from caregiver, and increase safety  .   Use of outcome tool(s) and clinical judgement create a POC that gives a: Difficult prediction of patient's progress: HIGH COMPLEXITY            TREATMENT:   (In addition to Assessment/Re-Assessment sessions the following treatments were rendered)   Pre-treatment Symptoms/Complaints:  confused  Pain: Initial:   Pain Intensity 1: 0  Post Session:  0/10 FLACC     Therapeutic Activity: (  8 Minutes ):  Therapeutic activities including Bed mobility (rolling, scooting, bridging, and supine <-> sit transfers), seated static/dynamic balance and functional activities, sit-stand transfers x many reps, standing pre-gait and balance activities, and few small side steps at edge of bed to improve mobility, strength, balance, coordination, and activity tolerance .  Required  minimal Verbal cues;Visual/Demos;Safety awareness training;Tactile cues;Manual cues to promote static and dynamic balance in standing and promote motor control of bilateral, lower extremity(s).     Braces/Orthotics/Lines/Etc:   IV  O2 Device: Room air  Treatment/Session Assessment:    Response to Treatment:  pt performs mobility with mod-max A  Interdisciplinary Collaboration:   Physical Therapist  Registered Nurse  After treatment position/precautions:   Supine in bed  Bed alarm/tab alert on  Bed/Chair-wheels locked  Bed in low position  Call light within reach  RN notified  sitter    Compliance with Program/Exercises: Will assess as treatment progresses  Recommendations/Intent for next treatment session:  "Next visit will focus on advancements to more challenging activities and reduction in assistance provided".  Total Treatment Duration:  PT Patient Time In/Time Out  Time In: 1130  Time Out: [employer], DPT

## 2019-04-03 NOTE — Progress Notes (Signed)
Provided calming presence  She is confused

## 2019-04-03 NOTE — Progress Notes (Signed)
Hospitalist Progress Note    Patient: Cathy Jackson MRN: [id number]  SSN: [id number]    Date of Birth: [birth year]  Age: 73 y.o.  Sex: female      Admit Date: 04/02/2019    LOS: 1 day     Subjective:     73 year old CF with PMH of frontotemporal dementia, bladder cancer s/p ileal conduit & s/p nephro-ureteral tube placement 03/29/19, hypothyroid, gerd, UTI who presents from home with worsening weakness, falling out of bed, worsening behavior, and now disoriented x 4.    8/9 - She is non-verbal for me. Just stares at me.    Review of systems negative except stated above.    Objective:     Visit Vitals  BP 115/71   Pulse (!) 102   Temp 98.2 ??F (36.8 ??C)   Resp 18   Ht '5\' 2"'  (1.575 m)   Wt 49.9 kg (110 lb)   LMP  (LMP Unknown)   SpO2 98%   BMI 20.12 kg/m??      Oxygen Therapy  O2 Sat (%): 98 % (04/03/19 1136)  Pulse via Oximetry: 92 beats per minute (04/02/19 1901)  O2 Device: Room air (04/03/19 1150)      Intake and Output:     Intake/Output Summary (Last 24 hours) at 04/03/2019 1336  Last data filed at 04/03/2019 1030  Gross per 24 hour   Intake 550 ml   Output 600 ml   Net -50 ml         Physical Exam:   GENERAL: alert, cooperative, no distress, appears stated age  EYE: conjunctivae/corneas clear. PERRL.  THROAT & NECK: normal and no erythema or exudates noted.   LUNG: clear to auscultation bilaterally  HEART: regular rate and rhythm, S1S2, no murmur, no JVD  ABDOMEN: soft, non-distended. Bowel sounds normal.   EXTREMITIES:  No edema, 2+ pedal/radial pulses bilaterally  SKIN: no rash or abnormalities  NEUROLOGIC: Alert. Cranial nerves 2-12 grossly intact.    Lab/Data Review:  Recent Results (from the past 24 hour(s))   CULTURE, URINE    Collection Time: 04/02/19  4:28 PM    Specimen: Clean catch; Urine   Result Value Ref Range    Special Requests: NO SPECIAL REQUESTS      Culture result:        NO GROWTH AFTER SHORT PERIOD OF INCUBATION. FURTHER RESULTS TO FOLLOW AFTER OVERNIGHT INCUBATION.    URINALYSIS W/ RFLX MICROSCOPIC    Collection Time: 04/02/19  4:29 PM   Result Value Ref Range    Color AMBER      Appearance TURBID      Specific gravity 1.013 1.001 - 1.023      pH (UA) 7.0 5.0 - 9.0      Protein 100 (A) NEG mg/dL    Glucose Negative mg/dL    Ketone TRACE (A) NEG mg/dL    Bilirubin Negative NEG      Blood LARGE (A) NEG      Urobilinogen 1.0 0.2 - 1.0 EU/dL    Nitrites Negative NEG      Leukocyte Esterase LARGE (A) NEG      WBC 0 0 /hpf    RBC 0-3 0 /hpf    Epithelial cells 0 0 /hpf    Bacteria 0 0 /hpf    Casts 0-3 0 /lpf   LACTIC ACID    Collection Time: 04/02/19  4:55 PM   Result Value Ref Range    Lactic acid 2.4 (HH)  0.4 - 2.0 MMOL/L   CULTURE, BLOOD    Collection Time: 04/02/19  4:55 PM    Specimen: Blood   Result Value Ref Range    Special Requests: LEFT  Antecubital        Culture result: NO GROWTH AFTER 13 HOURS     CBC WITH AUTOMATED DIFF    Collection Time: 04/02/19  4:56 PM   Result Value Ref Range    WBC 9.9 4.3 - 11.1 K/uL    RBC 4.29 4.05 - 5.2 M/uL    HGB 12.7 11.7 - 15.4 g/dL    HCT 39.8 35.8 - 46.3 %    MCV 92.8 79.6 - 97.8 FL    MCH 29.6 26.1 - 32.9 PG    MCHC 31.9 31.4 - 35.0 g/dL    RDW 13.3 11.9 - 14.6 %    PLATELET 130 (L) 150 - 450 K/uL    MPV 10.3 9.4 - 12.3 FL    ABSOLUTE NRBC 0.00 0.0 - 0.2 K/uL    DF AUTOMATED      NEUTROPHILS 70 43 - 78 %    LYMPHOCYTES 15 13 - 44 %    MONOCYTES 14 (H) 4.0 - 12.0 %    EOSINOPHILS 1 0.5 - 7.8 %    BASOPHILS 0 0.0 - 2.0 %    IMMATURE GRANULOCYTES 1 0.0 - 5.0 %    ABS. NEUTROPHILS 6.9 1.7 - 8.2 K/UL    ABS. LYMPHOCYTES 1.5 0.5 - 4.6 K/UL    ABS. MONOCYTES 1.4 (H) 0.1 - 1.3 K/UL    ABS. EOSINOPHILS 0.1 0.0 - 0.8 K/UL    ABS. BASOPHILS 0.0 0.0 - 0.2 K/UL    ABS. IMM. GRANS. 0.1 0.0 - 0.5 K/UL   METABOLIC PANEL, COMPREHENSIVE    Collection Time: 04/02/19  4:56 PM   Result Value Ref Range    Sodium 134 (L) 136 - 145 mmol/L    Potassium 4.5 3.5 - 5.1 mmol/L    Chloride 103 98 - 107 mmol/L    CO2 24 21 - 32 mmol/L    Anion gap 7 7 - 16 mmol/L     Glucose 119 (H) 65 - 100 mg/dL    BUN 34 (H) 8 - 23 MG/DL    Creatinine 1.38 (H) 0.6 - 1.0 MG/DL    GFR est AA 48 (L) >60 ml/min/1.79m    GFR est non-AA 40 (L) >60 ml/min/1.715m   Calcium 8.5 8.3 - 10.4 MG/DL    Bilirubin, total 0.6 0.2 - 1.1 MG/DL    ALT (SGPT) 19 12 - 65 U/L    AST (SGOT) 30 15 - 37 U/L    Alk. phosphatase 146 (H) 50 - 136 U/L    Protein, total 7.7 6.3 - 8.2 g/dL    Albumin 2.5 (L) 3.2 - 4.6 g/dL    Globulin 5.2 (H) 2.3 - 3.5 g/dL    A-G Ratio 0.5 (L) 1.2 - 3.5     PROCALCITONIN    Collection Time: 04/02/19  4:56 PM   Result Value Ref Range    Procalcitonin 0.15 ng/mL   TSH 3RD GENERATION    Collection Time: 04/02/19  4:56 PM   Result Value Ref Range    TSH 1.870 0.358 - 3.740 uIU/mL   LACTIC ACID    Collection Time: 04/03/19  5:16 AM   Result Value Ref Range    Lactic acid 1.5 0.4 - 2.0 MMOL/L   MAGNESIUM    Collection Time: 04/03/19  5:16 AM   Result Value Ref Range    Magnesium 1.8 1.8 - 2.4 mg/dL   METABOLIC PANEL, COMPREHENSIVE    Collection Time: 04/03/19  5:16 AM   Result Value Ref Range    Sodium 139 136 - 145 mmol/L    Potassium 3.8 3.5 - 5.1 mmol/L    Chloride 107 98 - 107 mmol/L    CO2 20 (L) 21 - 32 mmol/L    Anion gap 12 7 - 16 mmol/L    Glucose 92 65 - 100 mg/dL    BUN 24 (H) 8 - 23 MG/DL    Creatinine 0.91 0.6 - 1.0 MG/DL    GFR est AA >60 >60 ml/min/1.36m    GFR est non-AA >60 >60 ml/min/1.720m   Calcium 8.1 (L) 8.3 - 10.4 MG/DL    Bilirubin, total 0.4 0.2 - 1.1 MG/DL    ALT (SGPT) 14 12 - 65 U/L    AST (SGOT) 31 15 - 37 U/L    Alk. phosphatase 57 50 - 136 U/L    Protein, total 6.4 6.3 - 8.2 g/dL    Albumin 2.1 (L) 3.2 - 4.6 g/dL    Globulin 4.3 (H) 2.3 - 3.5 g/dL    A-G Ratio 0.5 (L) 1.2 - 3.5         SARS-CoV-2 Lab Results  "Novel Coronavirus" Test: No results found for: COV2NT   "Emergent Disease" Test: No results found for: EDPR  "SARS-COV-2" Test: No results found for: XGCOVT  "Precision Labs" Test: No results found for: RSLT  Rapid Test: No results found for: COVR         Imaging:  Ct Head Wo Cont    Result Date: 04/02/2019  CT HEAD WITHOUT CONTRAST. INDICATION: Altered mental status COMPARISON: None.  TECHNIQUE:   5 mm axial scans from the skull base to the vertex.  Our CT scanners use one or more of the following:  Automated exposure control, adjustment of the mA and or kV according to patient size, iterative reconstruction. FINDINGS:  No acute intraparenchymal hemorrhage or abnormal extra-axial fluid collection.  The ventricles are normal size.  No midline shift or mass effect. Included portion of the paranasal sinuses and orbits grossly unremarkable.     IMPRESSION:  Negative for acute intracranial abnormality.     Xr Chest Port    Result Date: 04/02/2019  CHEST X-RAY, one view. HISTORY:  Altered mental status. TECHNIQUE:  AP upright portable view. COMPARISON: 29 Dec 2017 FINDINGS:   -The lungs: are clear. -The heart size: is normal. -The costophrenic angles: are sharp. -The pulmonary vasculature: is unremarkable. -Included portion of the upper abdomen: is unremarkable. -Bones: No gross bony lesions. -Other: None.     IMPRESSION:  Negative for acute change.     Ir Change Uret Tube Via Ileal Conduit    Result Date: 03/21/2019  Title: Right retrograde nephroureteral drain exchange. Indication: [age]year-old female with history of ileal conduit and ureteral obstruction presents for nephroureteral drain exchange.  Consent: Informed written and oral consent was obtained from the patient after explanation of benefits and risks of procedure (including, but not limited to: infection, hemorrhage, loss of access).  The Patient's questions were answered to their satisfaction.  The patient stated understanding and requested that we proceed.  Procedure:  Maximal sterile barrier technique (including:  cap, mask, sterile gown, sterile gloves, sterile sheet, hand hygiene, and chlorhexidene for cutaneous antisepsis) were used.  With the patient prone  the skin around the  retrograde nephroureteral drain  was prepped and draped in the standard fashion. Using fluoroscopic guidance, the indwelling right retrograde nephroureteral catheter was removed over a Bentson wire.  A new 12 French retrograde nephroureteral catheter (biliary type) was positioned through the ileoconduit with the retention loop in the right renal pelvis.  Contrast was administered confirming appropriate position. An ostomy bag was placed over the ileal conduit with the catheter in place. Complications: None. Radiation Exposure Indices: Cumulative dose: 4 mGy Contrast: 8 mL Isovue 300 Medications: None. Findings: The old retrograde nephroureteral drain was occluded due to encrustation. Mild right hydronephrosis. New retrograde infrarenal drain in appropriate positioning..     Impression: Successful right retrograde nephroureteral drain exchange via an ileal conduit, 12 [place]. The old catheter was occluded with mild right hydronephrosis. Recommend return for change in 6 weeks given catheter encrustations.    Ir Nephroureteral Perc Rt Plc Cath New Access  Si    Result Date: 03/29/2019  PROCEDURE: Genitourinary catheter placement Procedural Personnel Attending physician(s): Vita Erm, M.D. Pre-procedure diagnosis: 73 year old woman with chronic right ureteral pelvic junction stenosis/occlusion, history of bladder cancer status post cystectomy with ileal conduit creation.  Chronic retrograde right nephroureteral drain inadvertently removed by the patient yesterday.  2 week history of decreased appetite, decreased oral intake, worsening cognition in a patient with underlying dementia.  Retrograde nephroureteral drain catheter most recently exchanged 03/21/2019. Post-procedure diagnosis: Same Indication: Urinary obstruction.  Placement of new anterograde nephroureteral drainage catheter replaced the disc lodged retrograde nephroureteral catheter.  Catheter(s) placed because the previous catheter(s) became dislodged within 30 days of placement (QCDR): Yes, one catheter Additional clinical history: None Complications: No immediate complications.     IMPRESSION: Successful percutaneous access to the Right kidney with nephroureteral drain placement.  Plan:  One hour bedrest recovery and then discharge home.  The nephroureteral drain is capped.  A spare bag is sent with the patient's daughter, her primary caregiver.  Return in about 2 months for drain maintenance.  If all goes well, anticipate drain exchange every 3 months.  Recommend no IV at the next visit. Recommend oral Valium and oxycodone..  _______________________________________________________________ PROCEDURE SUMMARY - Target organ: Unilateral native kidney - Image-guided placement of genitourinary catheter(s) - Additional procedure(s): None PROCEDURE DETAILS: Pre-procedure Consent:  Informed written and oral consent for the procedure was obtained after explanation of risks (including, but not limitted to: Infection, hemorrhage, kidney injury, lung injury, intestine injury), benefits and alternatives.  The patient's questions were answered to their satisfaction.  They stated understanding and requested that we proceed. Final verification:  A time-out identifying the patient and planned procedure was performed prior to the procedure.  Preparation:  Maximal sterile barrier technique (including:  cap, mask, sterile gown, sterile gloves, sterile sheet, hand hygiene, and cutaneous antisepsis) was used.  Anesthesia/sedation Level of anesthesia/sedation:  Moderate sedation (conscious sedation) Anesthesia/sedation administered by:  Independent trained observer under attending supervision with continuous monitoring of the patient?s level of consciousness and physiologic status Total intra-service sedation time (minutes):  29 Right genitourinary catheter placement Local anesthesia was administered.  A needle was  advanced into the dilated upper pole calyx under ultrasound guidance.  A small amount of contrast was administered in order to opacify the collecting system and proximal ureter.  A wire was passed down the ureter into the bladder, the tract was serially dilated, and a nephroureteral tube was placed.  Contrast injection was not performed due to suspected infection. Genitourinary catheter placed:  10.2-French 24 cm long urethane cope nephroureterostomy  catheter Findings:  Marked right hydronephrosis and chronic ureteral pelvic junction stenosis.  Debris fills the right collecting system and proximal ureter.  The distal pigtail was formed in the ileal conduit.  The proximal pigtail was formed in the renal pelvis. External catheter securement:  Non-absorbable suture and adhesive anchoring device Contrast Contrast agent:  IsoVue 300 Contrast volume (mL):  4 Radiation Dose Reference Air Kerma (Ka,r) :  4 mGy Dose Area Product/Kerma Area Product (DAP/KAP/PKA) :  45.24 cGy-cm2 Fluoroscopy Exposure Time :  1 minute 6 seconds Fluorographic Images :  4 Additional Details Additional description of procedure:  None Equipment details:  None Specimens removed:  None. A sample was sent for analysis. Estimated blood loss (mL):  Less than 10 Standardized report: [email or web address] Attestation Signer name:  Vita Erm, M.D. I attest that I performed the entire procedure. I reviewed the stored images and agree with the report as written.     No results found for this visit on 04/02/19.    Cultures:  All Micro Results     Procedure Component Value Units Date/Time    CULTURE, BLOOD [[id number]]     Order Status:  Sent Specimen:  Blood     CULTURE, URINE [[id number]] Collected:  04/02/19 1628    Order Status:  Completed Specimen:  Urine from Clean catch Updated:  04/03/19 0918     Special Requests: NO SPECIAL REQUESTS        Culture result:       NO GROWTH AFTER SHORT PERIOD OF INCUBATION. FURTHER RESULTS TO FOLLOW  AFTER OVERNIGHT INCUBATION.          CULTURE, BLOOD [[id number]] Collected:  04/02/19 1655    Order Status:  Completed Specimen:  Blood Updated:  04/03/19 0634     Special Requests: --        LEFT  Antecubital       Culture result: NO GROWTH AFTER 13 HOURS       CULTURE, BLOOD [[id number]] Collected:  04/02/19 1655    Order Status:  Canceled Specimen:  Blood           Assessment/Plan:     Principal Problem:    Urinary tract infection due to ESBL Klebsiella (04/03/2019)  - UA 8/8 with large leukocyte esterase and blood  - Urine culture 8/4 with >100,000 ESBL Klebsiella  - Will start Levaquin based on culture  - Repeat culture 8/8 pending    Active Problems:    Hypotension (04/02/2019)  - Improved  - Last BP 115/71  - Continue IVFs      AKI (acute kidney injury) ([place]) (04/02/2019)  - Resolved  - Continue IVFs      Acute metabolic encephalopathy (10/01/3783)  - Unsure if progression of dementia vs UTI  - Urine culture 8/4 with >100,000 ESBL Klebsiella  - Will start Levaquin based on culture  - TSH normal  - Electrolytes unremarkable      Severe sepsis with acute organ dysfunction ([place]) (04/03/2019)  - In ER patient with HR 98 + RR 22 + probable UTI + encephalopathy  - HR now 102  - Start Levaquin  - Urine culture 8/4 with >100,000 ESBL Klebsiella  - Repeat urine culture pending  - CXR unremarkable  - Blood cultures pending  - Lactic acid now normal  - Procalcitonin 0.15      Lactic acidosis (04/03/2019)  - Resolved  - Lactic Acid 2.4 --> 1.5  - Continue IVFs      Frontotemporal dementia ([place]) (02/01/2015)  -  Continue Aricept  - Continue Depakote  - Continue Trazodone      Asthma (10/27/2012)      GERD without esophagitis (10/27/2012)  - Continue Pepcid      Acquired hypothyroidism (08/29/2015)      Depression with anxiety (02/01/2015)      Hypoalbuminemia (04/03/2019)    Today's Plan: Start Levaquin. Continue IVFs.    DIET REGULAR    DVT Prophylaxis: Lovenox    Discharge Plan: TBD      Signed By: Faythe Casa, DO     April 03, 2019

## 2019-04-03 NOTE — Consults (Signed)
Comprehensive Nutrition Assessment    Type and Reason for Visit: Consult(General nutrition management (Hospitalist))    Nutrition Assessment:  Patient with dementia, bladder cancer s/p ileal conduit s/p nephro-ureteral tube placement, GERD, hypothyroid. She presented to ER with weakness, falling out of bead, and worsenig behavior.   Patient sleeping at RD visit. RN reports patient was up all night has has been sleeping all day. She states that she did eat bites of breakfast, but did not wake for lunch.  Called and spoke with daughter. She states that patient's appetite and intake have been declining for months. She state that it seems that patient has just lost interest in food. She states that she has just recently offering her softer foods as her mother would just chew and chew. She states that she has also started Boost in the last few weeks and she drinks 2-3 per day. She prefers chocolate. She states that last measured weight was office visit in July. She reports 19# weight loss in ~4 months.    Estimated Daily Nutrient Needs:  Energy (kcal):  1245-8099(83-38 kcal/kg (48.2 kg))  Protein (g):  58-63(1.2-1.3 g/kg (48.2kg))         Nutrition Related Findings:  Did not appriciate any wasting, but patient was covered up.      Current Nutrition Therapies:   DIET REGULAR    Anthropometric Measures:  ?? Height:  5\' 2"  (157.5 cm)  ?? Current Body Wt:  48.2 kg (106 lb 4.2 oz)(last office weight 03/15/2019)   ?? Usual Body Wt:  125 lb(per daughter)     ?? Ideal Body Wt:  110 lbs:  96.6 %   ?? Body mass index is 20.12 kg/m??.  ?? BMI Category:  Underweight (BMI less than 22) age over 60   ?? Per daughter's reported weight loss, 19# in 4 months. This is a potential for 15.3% weight loss in 4 months which meets ASPEN guidelines for severe weight loss..      Nutrition Diagnosis:   ?? Unintended weight loss related to inadequate protein-energy intake as evidenced by (daughter reported barriers to PO intake, diet recall)       Nutrition Interventions:   Food and/or Nutrient Delivery: Modify current diet, Start oral nutrition supplement - Change to mechanical soft diet, add chocolate Boost TID  NCoordination of Nutrition Care: (Discussed with Earnest Bailey, RN)    Goals:  Meet at least 75% nutrition needs within 7 days       Nutrition Monitoring and Evaluation:   Food/Nutrient Intake Outcomes: Food and nutrient intake    Discharge Planning:    Too soon to determine     [place], LD on 04/03/2019 at [place] PM  Contact: [phone]

## 2019-04-03 NOTE — Progress Notes (Signed)
END OF SHIFT NOTE:    INTAKE/OUTPUT  08/08 0701 - 08/09 0700  In: 550 [I.V.:550]  Out: 500 [Urine:500]  Voiding: NO  Catheter: NO  Drain:   Nephrostomy Tube Flank (Active)   Site Assessment Clean, dry, & intact 04/02/19 2237   Dressing Status Clean, dry, & intact;New 04/02/19 2237   Status Clamped 04/02/19 2237     Urostomy draining          Flatus: Patient cannot tell me if she does have flatus present.    Stool:  0 occurrences.    Characteristics:       Emesis: 0 occurrences.    Characteristics:        VITAL SIGNS  Patient Vitals for the past 12 hrs:   Temp Pulse Resp BP SpO2   04/03/19 0506 98.4 ??F (36.9 ??C) 91 20 99/63 93 %   04/02/19 2302 98.2 ??F (36.8 ??C) 87 20 91/50 95 %   04/02/19 2040 ??? ??? ??? 110/55 ???   04/02/19 2021 ??? ??? ??? 118/67 ???   04/02/19 1901 ??? 92 ??? 136/63 98 %       Pain Assessment                Ambulating  No  Keeps trying to get oob. Calling for daughter. Wants to go ho,me  Shift report given to oncoming nurse at the bedside.    Latricia Heft, RN

## 2019-04-03 NOTE — Progress Notes (Signed)
CM viewed patient's chart. Patient present from home with worsening weakness, falling out of bed, worsening behavior and now disoriented x 4. CM attempted to call Emergency contact (daughter, Domingo Dimes [phone])  Due to patient having altered mental status/encephalopathy and hx of frontotemporal demential. Per notes, patient is oriented to self and DOB only. PT is recommending SNF for patient. CM LVM for Ms. Boyington to call back, and will discuss PT recommendation at that time.    CM will continue to follow.

## 2019-04-04 LAB — PLEASE READ & DOCUMENT PPD TEST IN 24 HRS
PPD: NEGATIVE Negative
mm Induration: 0 mm (ref 0–5)

## 2019-04-04 MED FILL — VITAMIN C 500 MG TABLET: 500 mg | ORAL | Qty: 2

## 2019-04-04 MED FILL — DONEPEZIL 5 MG TAB: 5 mg | ORAL | Qty: 2

## 2019-04-04 MED FILL — DIVALPROEX 500 MG 24 HR TAB: 500 mg | ORAL | Qty: 1

## 2019-04-04 MED FILL — LOVENOX 30 MG/0.3 ML SUB-Q SYRINGE: 30 mg/0.3 mL | SUBCUTANEOUS | Qty: 0.3

## 2019-04-04 MED FILL — OYSTER SHELL CALCIUM 500  500 MG CALCIUM (1,250 MG) TABLET: 500 mg calcium (1,250 mg) | ORAL | Qty: 1

## 2019-04-04 MED FILL — FERROUS SULFATE 325 MG (65 MG ELEMENTAL IRON) TAB: 325 mg (65 mg iron) | ORAL | Qty: 1

## 2019-04-04 MED FILL — TRAZODONE 50 MG TAB: 50 mg | ORAL | Qty: 1

## 2019-04-04 MED FILL — FAMOTIDINE 20 MG TAB: 20 mg | ORAL | Qty: 1

## 2019-04-04 MED FILL — ATORVASTATIN 10 MG TAB: 10 mg | ORAL | Qty: 1

## 2019-04-04 MED FILL — STOOL SOFTENER-STIMULANT LAXATIVE 8.6 MG-50 MG TABLET: ORAL | Qty: 1

## 2019-04-04 MED FILL — PAROXETINE 20 MG TAB: 20 mg | ORAL | Qty: 1

## 2019-04-04 NOTE — Progress Notes (Signed)
Bed offer from [facility] and accepted. Pt will be dc to STR once auth obtained for admission, started today  Covid test pending

## 2019-04-04 NOTE — Progress Notes (Signed)
Called spoke with daughter Danelle Berry, states pt lives with her in a 2 level home with ramp to enter, states pt has become more dependent on her for ADLs, no current DME. Demographics, insurance and PCP confirmed.    PT/OT working with pt and recommend STR, daughter requested referral to [facility], referrals placed in  CC link CM will await response.    PPD placed, pt will need Covid testing prior to dc.    Care Management Interventions  PCP Verified by CM: Yes  Transition of Care Consult (CM Consult): SNF  Physical Therapy Consult: Yes  Occupational Therapy Consult: Yes  Current Support Network: (lives with daughter)  Confirm Follow Up Transport: Family  The Plan for Transition of Care is Related to the Following Treatment Goals : to increase mobility  The Patient and/or Patient Representative was Provided with a Choice of Provider and Agrees with the Discharge Plan?: Yes  Name of the Patient Representative Who was Provided with a Choice of Provider and Agrees with the Discharge Plan: pt  Freedom of Choice List was Provided with Basic Dialogue that Supports the Patient's Individualized Plan of Care/Goals, Treatment Preferences and Shares the Quality Data Associated with the Providers?: Yes  Discharge Location  Discharge Placement: Skilled nursing facility

## 2019-04-04 NOTE — Progress Notes (Signed)
END OF SHIFT NOTE:    INTAKE/OUTPUT  08/09 0701 - 08/10 0700  In: 1953 [I.V.:1953]  Out: 400 [Urine:400]  Voiding: YES  Catheter: NO  Drain:   Nephrostomy Tube Flank (Active)   Site Assessment Clean, dry, & intact 04/04/19 1415   Dressing Status Clean, dry, & intact 04/04/19 1415   Status Clamped 04/04/19 1415               Flatus: Patient does have flatus present.    Stool:  0 occurrences.    Characteristics:       Emesis: 0 occurrences.    Characteristics:        VITAL SIGNS  Patient Vitals for the past 12 hrs:   Temp Pulse Resp BP SpO2   04/04/19 1523 97.5 ??F (36.4 ??C) 88 18 143/66 98 %   04/04/19 1111 97.9 ??F (36.6 ??C) 85 18 142/75 97 %   04/04/19 0721 97.6 ??F (36.4 ??C) 88 18 137/77 97 %       Pain Assessment  Pain Intensity 1: 0 (04/04/19 1415)        Patient Stated Pain Goal: 0    Ambulating  No    Shift report given to oncoming nurse at the bedside.    Fritzi Mandes

## 2019-04-04 NOTE — Progress Notes (Signed)
Problem: Risk for Spread of Infection  Goal: Prevent transmission of infectious organism to others  Description: Prevent the transmission of infectious organisms to other patients, staff members, and visitors.  Outcome: Progressing Towards Goal

## 2019-04-04 NOTE — Progress Notes (Signed)
Problem: Self Care Deficits Care Plan (Adult)  Goal: *Acute Goals and Plan of Care (Insert Text)  Outcome: Progressing Towards Goal  Note: 1. Pt will toilet with minA   2. Pt will complete functional mobility for ADLs with min A  3. Pt will complete upper body bathing and dressing with set up using AE as needed  4. Pt will complete grooming and hygiene with set up  5. Pt will demonstrate independence with HEP to promote increased BUE strength and functional use for ADLs  7. Pt will complete bed mobility with CGA in prep for ADLs    Timeframe: 7 days       OCCUPATIONAL THERAPY: Initial Assessment and Daily Note 04/04/2019  INPATIENT: OT Visit Days: 1  Payor: AETNA MEDICARE / Plan: [facility] AETNA MEDICARE ADVANTAGE / Product Type: Managed Care Medicare /      NAME/AGE/GENDER: Kenzley Ke is a 73 y.o. female   PRIMARY DIAGNOSIS:  Altered mental status, unspecified [R41.82]  Hypotension [I95.9]  AKI (acute kidney injury) ([place]) [N17.9] Urinary tract infection due to ESBL Klebsiella Urinary tract infection due to ESBL Klebsiella        ICD-10: Treatment Diagnosis:    History of falling (Z91.81)   Precautions/Allergies:    falls Patient has no known allergies.      ASSESSMENT:     Ms. Peeks presents from home after she fell out of bed, with worsening behavior. Diagnosis of UTI, AKI. Pt unable to provide home or PLOF information d/t cognitive deficits, per chart has supportive daughter. This session, pt presented with deficits in cognition, mobility, balance, strength, and endurance impacting ADLs. Pt A&O to person only, very confused. Pt followed simple commands with min-mod cues. Pt required min A for bed mobility, mod to stand, and max A for SPT to chair. Min A to change hospital gown and for simple hygiene. Pt generally weak with decreased UE strength observed functionally, unable to complete formal MMT d/t cognition. Pt is likely below her functional baseline and would  benefit from skilled OT services to address deficits. Recommend d/c to STR.     This section established at most recent assessment   PROBLEM LIST (Impairments causing functional limitations):  Decreased Strength  Decreased ADL/Functional Activities  Decreased Transfer Abilities  Decreased Balance  Decreased Activity Tolerance  Decreased Cognition   INTERVENTIONS PLANNED: (Benefits and precautions of occupational therapy have been discussed with the patient.)  Activities of daily living training  Adaptive equipment training  Balance training  Cognitive training  Therapeutic activity  Therapeutic exercise     TREATMENT PLAN: Frequency/Duration: Follow patient 3 times/ week to address above goals.  Rehabilitation Potential For Stated Goals: Day (at time of discharge pending progress):    Placement:  It is my opinion, based on this patient's performance to date, that Ms. Busenbark may benefit from intensive therapy at a [facility] after discharge due to the functional deficits listed above that are likely to improve with skilled rehabilitation and fall risk, inability to complete ADLs or mobilize .  Equipment:   None at this time              OCCUPATIONAL PROFILE AND HISTORY:   History of Present Injury/Illness (Reason for Referral):  See H&P  Past Medical History/Comorbidities:   Ms. Onley  has a past medical history of Abdominal pain, unspecified site (10/27/2012), Arthritis, Asthma (10/27/2012), Bladder cancer ([place]), Depression, Dysuria (10/27/2012), Environmental allergies, GERD (gastroesophageal reflux  disease) (10/27/2012), History of kidney stones, Hypothyroidism, Other "heavy-for-dates" infants, Unspecified disorder of bladder (10/27/2012), Urethral caruncle (10/27/2012), Urinary frequency (10/27/2012), and Urinary tract infection, site not specified (10/27/2012). She also has no past medical history of Difficult intubation, Malignant  hyperthermia due to anesthesia, Nausea & vomiting, Pseudocholinesterase deficiency, or Unspecified adverse effect of anesthesia.  Ms. Oakey  has a past surgical history that includes hx breast biopsy (Left, 2000); hx cesarean section; hx tubal ligation; pr close cystostomy; hx wrist fracture tx (Left); hx cholecystectomy; hx nephrostomy (2014); ir exchange nephro perc rt si (10/23/2017); ir convert nephro perc rt to nephroureteral cath existing si (11/12/2017); ir change internal ureteral stent si (02/15/2018); ir change internal ureteral stent si (04/15/2018); ir change internal ureteral stent si (06/14/2018); ir change uret tube via ileal conduit (09/07/2018); ir change uret tube via ileal conduit (12/20/2018); ir change uret tube via ileal conduit (03/21/2019); and ir nephroureteral perc rt plc cath new access  si (03/29/2019).  Social History/Living Environment:    unknown  Prior Level of Function/Work/Activity:  Unknown     Number of Personal Factors/Comorbidities that affect the Plan of Care: Expanded review of therapy/medical records (1-2):  MODERATE COMPLEXITY   ASSESSMENT OF OCCUPATIONAL PERFORMANCE::   Activities of Daily Living:   Basic ADLs (From Assessment) Complex ADLs (From Assessment)   Feeding: Moderate assistance  Oral Facial Hygiene/Grooming: Moderate assistance  Bathing: Maximum assistance  Upper Body Dressing: Maximum assistance  Lower Body Dressing: Maximum assistance  Toileting: Maximum assistance Instrumental ADL  Meal Preparation: Total assistance  Homemaking: Total assistance  Medication Management: Total assistance  Financial Management: Total assistance   Grooming/Bathing/Dressing Activities of Daily Living   Grooming  Washing Face: Minimum assistance                 Upper Body [place]: Moderate assistance       Bed/Mat Mobility  Supine to Sit: Minimum assistance  Sit to Stand: Moderate assistance  Stand to Sit: Moderate assistance  Bed to Chair: Maximum assistance   Scooting: Moderate assistance     Most Recent Physical Functioning:   Gross Assessment:  AROM: Generally decreased, functional  Strength: Generally decreased, functional  Coordination: Generally decreased, functional               Posture:  Posture (WDL): Exceptions to WDL  Posture Assessment: Forward head, Rounded shoulders, Trunk flexion  Balance:  Sitting: Impaired  Sitting - Static: Fair (occasional)  Sitting - Dynamic: Fair (occasional)  Standing: Impaired  Standing - Static: Poor  Standing - Dynamic : Poor Bed Mobility:  Supine to Sit: Minimum assistance  Scooting: Moderate assistance  Wheelchair Mobility:     Transfers:  Sit to Stand: Moderate assistance  Stand to Sit: Moderate assistance  Bed to Chair: Maximum assistance            Patient Vitals for the past 6 hrs:   BP SpO2 Pulse   04/04/19 0721 137/77 97 % 88   04/04/19 1111 142/75 97 % 85       Mental Status  Neurologic State: Alert  Orientation Level: Oriented to person  Cognition: Decreased attention/concentration, Decreased command following, Follows commands                          Physical Skills Involved:  Balance  Strength  Activity Tolerance Cognitive Skills Affected (resulting in the inability to perform in a timely and safe manner):  Executive Function  Immediate Memory  Short Term Recall  Long Term Memory  Sustained Attention  Divided Attention  Comprehension Psychosocial Skills Affected:  Habits/Routines  Environmental Adaptation   Number of elements that affect the Plan of Care: 5+:  HIGH COMPLEXITY   CLINICAL DECISION MAKING:   [employer] AM-PAC??? ???6 Clicks???   Daily Activity Inpatient Short Form  How much help from another person does the patient currently need... Total A Lot A Little None   1.  Putting on and taking off regular lower body clothing?   []  1   [x]  2   []  3   []  4   2.  Bathing (including washing, rinsing, drying)?   []  1   [x]  2   []  3   []  4   3.  Toileting, which includes using toilet, bedpan or urinal?   []  1   [x]   2   []  3   []  4   4.  Putting on and taking off regular upper body clothing?   []  1   [x]  2   []  3   []  4   5.  Taking care of personal grooming such as brushing teeth?   []  1   [x]  2   []  3   []  4   6.  Eating meals?   []  1   [x]  2   []  3   []  4   ?? 2007, [employer], under license to [employer]. All rights reserved      Score:  Initial: 12 Most Recent: X (Date: -- )    Interpretation of Tool:  Represents activities that are increasingly more difficult (i.e. Bed mobility, Transfers, Gait).    Medical Necessity:     Patient demonstrates   fair   rehab potential due to higher previous functional level.  Reason for Services/Other Comments:  Patient   continues to require present interventions due to patient's inability to complete ADLs  .   Use of outcome tool(s) and clinical judgement create a POC that gives a: MODERATE COMPLEXITY         TREATMENT:   (In addition to Assessment/Re-Assessment sessions the following treatments were rendered)     Pre-treatment Symptoms/Complaints:    Pain: Initial:   Pain Intensity 1: 0  Post Session:  0     Self Care: (8 min): Procedure(s) (per grid) utilized to improve and/or restore self-care/home management as related to dressing and grooming. Required moderate   cueing to facilitate activities of daily living skills and compensatory activities.    Braces/Orthotics/Lines/Etc:   O2 Device: Room air  Treatment/Session Assessment:    Response to Treatment:  no adverse reaction   Interdisciplinary Collaboration:   Tour manager  After treatment position/precautions:   Up in chair  Bed alarm/tab alert on  Bed/Chair-wheels locked  Bed in low position  Call light within reach  RN notified   Compliance with Program/Exercises: Will assess as treatment progresses.  Recommendations/Intent for next treatment session:  "Next visit will focus on advancements to more challenging activities and reduction in assistance provided".   Total Treatment Duration:  OT Patient Time In/Time Out  Time In: 0925  Time Out: [place], OT

## 2019-04-04 NOTE — Progress Notes (Signed)
Hospitalist Progress Note    Patient: Cathy Jackson MRN: [id number]  SSN: [id number]    Date of Birth: Oct 24, 1945  Age: 73 y.o.  Sex: female      Admit Date: 04/02/2019    LOS: 2 days     Subjective:     73 year old CF with PMH of frontotemporal dementia, bladder cancer s/p ileal conduit & s/p nephro-ureteral tube placement 03/29/19, hypothyroid, gerd, UTI who presents from home with worsening weakness, falling out of bed, worsening behavior, and now disoriented x 4.    8/10 - She is more alert this AM. Thinks "they" hurt her with her IV. Doesn't answer any questions appropriately.    Review of systems negative except stated above.    Objective:     Visit Vitals  BP 137/77   Pulse 88   Temp 97.6 ??F (36.4 ??C)   Resp 18   Ht 5\' 2"  (1.575 m)   Wt 49.9 kg (110 lb)   LMP  (LMP Unknown)   SpO2 97%   BMI 20.12 kg/m??      Oxygen Therapy  O2 Sat (%): 97 % (04/04/19 0721)  Pulse via Oximetry: 92 beats per minute (04/02/19 1901)  O2 Device: Room air (04/03/19 1520)      Intake and Output:     Intake/Output Summary (Last 24 hours) at 04/04/2019 0848  Last data filed at 04/03/2019 1821  Gross per 24 hour   Intake 1951 ml   Output 400 ml   Net 1551 ml         Physical Exam:   GENERAL: alert, cooperative, no distress, appears stated age  EYE: conjunctivae/corneas clear. PERRL.  THROAT & NECK: normal and no erythema or exudates noted.   LUNG: clear to auscultation bilaterally  HEART: regular rate and rhythm, S1S2, no murmur, no JVD  ABDOMEN: soft, non-distended. Bowel sounds normal.   EXTREMITIES:  No edema, 2+ pedal/radial pulses bilaterally  SKIN: no rash or abnormalities  NEUROLOGIC: Alert. Cranial nerves 2-12 grossly intact.    Lab/Data Review:  Recent Results (from the past 24 hour(s))   CULTURE, BLOOD    Collection Time: 04/03/19  2:39 PM    Specimen: Blood   Result Value Ref Range    Special Requests: RIGHT  HAND        Culture result: NO GROWTH AFTER 16 HOURS     PLEASE READ & DOCUMENT PPD TEST IN 24 HRS     Collection Time: 04/03/19 10:55 PM   Result Value Ref Range    PPD Negative Negative    mm Induration 0 0 - 5 mm       SARS-CoV-2 Lab Results  "Novel Coronavirus" Test: No results found for: COV2NT   "Emergent Disease" Test: No results found for: EDPR  "SARS-COV-2" Test: No results found for: XGCOVT  "Precision Labs" Test: No results found for: RSLT  Rapid Test: No results found for: COVR        Imaging:  Ct Head Wo Cont    Result Date: 04/02/2019  CT HEAD WITHOUT CONTRAST. INDICATION: Altered mental status COMPARISON: None.  TECHNIQUE:   5 mm axial scans from the skull base to the vertex.  Our CT scanners use one or more of the following:  Automated exposure control, adjustment of the mA and or kV according to patient size, iterative reconstruction. FINDINGS:  No acute intraparenchymal hemorrhage or abnormal extra-axial fluid collection.  The ventricles are normal size.  No midline shift or mass effect.  Included portion of the paranasal sinuses and orbits grossly unremarkable.     IMPRESSION:  Negative for acute intracranial abnormality.     Xr Chest Port    Result Date: 04/02/2019  CHEST X-RAY, one view. HISTORY:  Altered mental status. TECHNIQUE:  AP upright portable view. COMPARISON: 29 Dec 2017 FINDINGS:   -The lungs: are clear. -The heart size: is normal. -The costophrenic angles: are sharp. -The pulmonary vasculature: is unremarkable. -Included portion of the upper abdomen: is unremarkable. -Bones: No gross bony lesions. -Other: None.     IMPRESSION:  Negative for acute change.     Ir Change Uret Tube Via Ileal Conduit    Result Date: 03/21/2019  Title: Right retrograde nephroureteral drain exchange. Indication: 73 -year-old female with history of ileal conduit and ureteral obstruction presents for nephroureteral drain exchange.  Consent: Informed written and oral consent was obtained from the patient after explanation of benefits and risks of procedure (including, but not limited to: infection,  hemorrhage, loss of access).  The Patient's questions were answered to their satisfaction.  The patient stated understanding and requested that we proceed.  Procedure:  Maximal sterile barrier technique (including:  cap, mask, sterile gown, sterile gloves, sterile sheet, hand hygiene, and chlorhexidene for cutaneous antisepsis) were used.  With the patient prone the skin around the retrograde nephroureteral drain  was prepped and draped in the standard fashion. Using fluoroscopic guidance, the indwelling right retrograde nephroureteral catheter was removed over a Bentson wire.  A new 12 French retrograde nephroureteral catheter (biliary type) was positioned through the ileoconduit with the retention loop in the right renal pelvis.  Contrast was administered confirming appropriate position. An ostomy bag was placed over the ileal conduit with the catheter in place. Complications: None. Radiation Exposure Indices: Cumulative dose: 4 mGy Contrast: 8 mL Isovue 300 Medications: None. Findings: The old retrograde nephroureteral drain was occluded due to encrustation. Mild right hydronephrosis. New retrograde infrarenal drain in appropriate positioning..     Impression: Successful right retrograde nephroureteral drain exchange via an ileal conduit, 12 [place]. The old catheter was occluded with mild right hydronephrosis. Recommend return for change in 6 weeks given catheter encrustations.    Ir Nephroureteral Perc Rt Plc Cath New Access  Si    Result Date: 03/29/2019  PROCEDURE: Genitourinary catheter placement Procedural Personnel Attending physician(s): Vita Erm, M.D. Pre-procedure diagnosis: 73 year old woman with chronic right ureteral pelvic junction stenosis/occlusion, history of bladder cancer status post cystectomy with ileal conduit creation.  Chronic retrograde right nephroureteral drain inadvertently removed by the patient yesterday.  2 week history of decreased appetite,  decreased oral intake, worsening cognition in a patient with underlying dementia.  Retrograde nephroureteral drain catheter most recently exchanged 03/21/2019. Post-procedure diagnosis: Same Indication: Urinary obstruction.  Placement of new anterograde nephroureteral drainage catheter replaced the disc lodged retrograde nephroureteral catheter. Catheter(s) placed because the previous catheter(s) became dislodged within 30 days of placement (QCDR): Yes, one catheter Additional clinical history: None Complications: No immediate complications.     IMPRESSION: Successful percutaneous access to the Right kidney with nephroureteral drain placement.  Plan:  One hour bedrest recovery and then discharge home.  The nephroureteral drain is capped.  A spare bag is sent with the patient's daughter, her primary caregiver.  Return in about 2 months for drain maintenance.  If all goes well, anticipate drain exchange every 3 months.  Recommend no IV at the next visit. Recommend oral Valium and oxycodone..  _______________________________________________________________ PROCEDURE SUMMARY - Target organ: Unilateral native kidney -  Image-guided placement of genitourinary catheter(s) - Additional procedure(s): None PROCEDURE DETAILS: Pre-procedure Consent:  Informed written and oral consent for the procedure was obtained after explanation of risks (including, but not limitted to: Infection, hemorrhage, kidney injury, lung injury, intestine injury), benefits and alternatives.  The patient's questions were answered to their satisfaction.  They stated understanding and requested that we proceed. Final verification:  A time-out identifying the patient and planned procedure was performed prior to the procedure.  Preparation:  Maximal sterile barrier technique (including:  cap, mask, sterile gown, sterile gloves, sterile sheet, hand hygiene, and cutaneous antisepsis) was used.  Anesthesia/sedation Level of anesthesia/sedation:  Moderate  sedation (conscious sedation) Anesthesia/sedation administered by:  Independent trained observer under attending supervision with continuous monitoring of the patient?s level of consciousness and physiologic status Total intra-service sedation time (minutes):  29 Right genitourinary catheter placement Local anesthesia was administered.  A needle was advanced into the dilated upper pole calyx under ultrasound guidance.  A small amount of contrast was administered in order to opacify the collecting system and proximal ureter.  A wire was passed down the ureter into the bladder, the tract was serially dilated, and a nephroureteral tube was placed.  Contrast injection was not performed due to suspected infection. Genitourinary catheter placed:  10.2-French 24 cm long urethane cope nephroureterostomy catheter Findings:  Marked right hydronephrosis and chronic ureteral pelvic junction stenosis.  Debris fills the right collecting system and proximal ureter.  The distal pigtail was formed in the ileal conduit.  The proximal pigtail was formed in the renal pelvis. External catheter securement:  Non-absorbable suture and adhesive anchoring device Contrast Contrast agent:  IsoVue 300 Contrast volume (mL):  4 Radiation Dose Reference Air Kerma (Ka,r) :  4 mGy Dose Area Product/Kerma Area Product (DAP/KAP/PKA) :  45.24 cGy-cm2 Fluoroscopy Exposure Time :  1 minute 6 seconds Fluorographic Images :  4 Additional Details Additional description of procedure:  None Equipment details:  None Specimens removed:  None. A sample was sent for analysis. Estimated blood loss (mL):  Less than 10 Standardized report: [email or web address] Attestation Signer name:  Vita Erm, M.D. I attest that I performed the entire procedure. I reviewed the stored images and agree with the report as written.     No results found for this visit on 04/02/19.    Cultures:  All Micro Results     Procedure Component Value Units Date/Time     CULTURE, URINE [[id number]]  (Abnormal) Collected:  04/02/19 1628    Order Status:  Completed Specimen:  Urine from Clean catch Updated:  04/04/19 0831     Special Requests: NO SPECIAL REQUESTS        Culture result:       >100,000 COLONIES/mL GRAM NEGATIVE RODS SUBCULTURE IN PROGRESS                  CULTURE IN PROGRESS,FURTHER UPDATES TO FOLLOW          CULTURE, BLOOD [[id number]] Collected:  04/03/19 1439    Order Status:  Completed Specimen:  Blood Updated:  04/04/19 0800     Special Requests: --        RIGHT  HAND       Culture result: NO GROWTH AFTER 16 HOURS       CULTURE, BLOOD [[id number]] Collected:  04/02/19 1655    Order Status:  Completed Specimen:  Blood Updated:  04/04/19 0800     Special Requests: --        LEFT  Antecubital       Culture result: NO GROWTH 2 DAYS       CULTURE, BLOOD [[id number]] Collected:  04/02/19 1655    Order Status:  Canceled Specimen:  Blood           Assessment/Plan:     Principal Problem:    Urinary tract infection due to ESBL Klebsiella (04/03/2019)  - UA 8/8 with large leukocyte esterase and blood  - Urine culture 8/4 with >100,000 ESBL Klebsiella  - Continue Levaquin based on culture  - Repeat culture 8/8 with >100,000 GNR    Active Problems:    Hypotension (04/02/2019)  - Resolved  - Last BP 115/71  - Continue IVFs      AKI (acute kidney injury) ([place]) (04/02/2019)  - Resolved  - Continue IVFs      Acute metabolic encephalopathy (12/28/8125)  - Unsure if progression of dementia vs UTI  - Talking more today so ?improving  - Urine culture 8/4 with >100,000 ESBL Klebsiella  - Continue Levaquin based on culture  - TSH normal  - Electrolytes unremarkable      Severe sepsis with acute organ dysfunction ([place]) (04/03/2019)  - In ER patient with HR 98 + RR 22 + probable UTI + encephalopathy  - HR now 88  - Continue Levaquin  - Urine culture 8/4 with >100,000 ESBL Klebsiella  - Repeat urine culture with >100,000 GNR  - CXR unremarkable  - Blood cultures NGTD  - Lactic acid now normal   - Procalcitonin 0.15      Lactic acidosis (04/03/2019)  - Resolved  - Lactic Acid 2.4 --> 1.5  - Continue IVFs      Frontotemporal dementia ([place] ) (02/01/2015)  - Continue Aricept  - Continue Depakote  - Continue Trazodone      Asthma (10/27/2012)      GERD without esophagitis (10/27/2012)  - Continue Pepcid      Acquired hypothyroidism (08/29/2015)      Depression with anxiety (02/01/2015)      Hypoalbuminemia (04/03/2019)    Today's Plan: Continue Levaquin. Continue IVFs since not eating.    DIET MECHANICAL SOFT  DIET NUTRITIONAL SUPPLEMENTS All Meals; Other ( )    DVT Prophylaxis: Lovenox    Discharge Plan: TBD      Signed By: Faythe Casa, DO     April 04, 2019

## 2019-04-05 LAB — PLEASE READ & DOCUMENT PPD TEST IN 48 HRS
PPD: NEGATIVE Negative
mm Induration: 0 mm (ref 0–5)

## 2019-04-05 MED FILL — LEVOFLOXACIN IN D5W 500 MG/100 ML IV PIGGY BACK: 500 mg/100 mL | INTRAVENOUS | Qty: 100

## 2019-04-05 MED FILL — TRAZODONE 50 MG TAB: 50 mg | ORAL | Qty: 1

## 2019-04-05 MED FILL — DONEPEZIL 5 MG TAB: 5 mg | ORAL | Qty: 2

## 2019-04-05 MED FILL — DIVALPROEX 500 MG 24 HR TAB: 500 mg | ORAL | Qty: 1

## 2019-04-05 MED FILL — OYSTER SHELL CALCIUM 500  500 MG CALCIUM (1,250 MG) TABLET: 500 mg calcium (1,250 mg) | ORAL | Qty: 1

## 2019-04-05 MED FILL — LOVENOX 30 MG/0.3 ML SUB-Q SYRINGE: 30 mg/0.3 mL | SUBCUTANEOUS | Qty: 0.3

## 2019-04-05 MED FILL — STOOL SOFTENER-STIMULANT LAXATIVE 8.6 MG-50 MG TABLET: ORAL | Qty: 1

## 2019-04-05 MED FILL — FERROUS SULFATE 325 MG (65 MG ELEMENTAL IRON) TAB: 325 mg (65 mg iron) | ORAL | Qty: 1

## 2019-04-05 MED FILL — VITAMIN C 500 MG TABLET: 500 mg | ORAL | Qty: 2

## 2019-04-05 MED FILL — PAROXETINE 20 MG TAB: 20 mg | ORAL | Qty: 1

## 2019-04-05 MED FILL — ATORVASTATIN 10 MG TAB: 10 mg | ORAL | Qty: 1

## 2019-04-05 MED FILL — FAMOTIDINE 20 MG TAB: 20 mg | ORAL | Qty: 1

## 2019-04-05 NOTE — Telephone Encounter (Signed)
-----   Message from Laverle Hobby, DO sent at 03/29/2019  9:09 PM EDT -----  Regarding: please reschedule appt for her...see below    ----- Message -----  From: Vladimir Creeks, MD  Sent: 03/29/2019   2:05 PM EDT  To: Dione Booze Sterrett, DO    I think Patsy Hadley was on your office schedule for today.  She won't be able to make her appointment, she is in IR today.  She pulled her retrograde nephroureteral drain out last night.  I have place a new Perc Neph-U in her today.      mjl

## 2019-04-05 NOTE — Progress Notes (Signed)
END OF SHIFT NOTE:    INTAKE/OUTPUT  08/10 0701 - 08/11 0700  In: -   Out: 1250 [Urine:1250]  Voiding: YES  Catheter: NO  Drain:   Nephrostomy Tube Flank (Active)   Site Assessment Clean, dry, & intact 04/04/19 1415   Dressing Status Clean, dry, & intact 04/04/19 1415   Status Clamped 04/04/19 1415               Flatus: Patient does have flatus present.    Stool:  0 occurrences.    Characteristics:       Emesis: 0 occurrences.    Characteristics:        VITAL SIGNS  Patient Vitals for the past 12 hrs:   Temp Pulse Resp BP SpO2   04/05/19 0435 98 ??F (36.7 ??C) 87 16 143/88 94 %   04/04/19 2345 97.9 ??F (36.6 ??C) 88 17 149/81 98 %   04/04/19 1951 98.6 ??F (37 ??C) 82 18 153/79 97 %       Pain Assessment  Pain Intensity 1: 0 (04/05/19 0245)        Patient Stated Pain Goal: 0    Ambulating  No  Continue Heparin gtt. No change in rate.     Shift report given to oncoming nurse at the bedside.    Zara Council [place]

## 2019-04-05 NOTE — Progress Notes (Signed)
END OF SHIFT NOTE:    INTAKE/OUTPUT  08/10 0701 - 08/11 0700  In: -   Out: 1250 [Urine:1250]  Voiding: NO (UROSTOMY)  Catheter: NO  Drain:   Nephrostomy Tube Flank (Active)   Site Assessment Clean, dry, & intact 04/05/19 1400   Dressing Status Clean, dry, & intact 04/05/19 1400   Status Clamped 04/05/19 1400               Flatus: Patient does have flatus present.    Stool:  0 occurrences.    Characteristics:       Emesis: 0 occurrences.    Characteristics:        VITAL SIGNS  Patient Vitals for the past 12 hrs:   Temp Pulse Resp BP SpO2   04/05/19 1608 98.2 ??F (36.8 ??C) 80 16 111/70 95 %   04/05/19 1120 98.1 ??F (36.7 ??C) 81 16 108/66 95 %       Pain Assessment  Pain Intensity 1: 0 (04/05/19 1400)        Patient Stated Pain Goal: 0    Ambulating  No    Shift report given to oncoming nurse at the bedside.    Fritzi Mandes

## 2019-04-05 NOTE — Progress Notes (Signed)
Hospitalist Progress Note    Patient: Cathy Jackson MRN: [id number]  SSN: [id number]    Date of Birth: 1946-05-30  Age: 73 y.o.  Sex: female      Admit Date: 04/02/2019    LOS: 3 days     Subjective:     73 year old CF with PMH of frontotemporal dementia, bladder cancer s/p ileal conduit & s/p nephro-ureteral tube placement 03/29/19, hypothyroid, gerd, UTI who presents from home with worsening weakness, falling out of bed, worsening behavior, and now disoriented x 4.    8/11 - She is alert and calm today. Offers no complaints. Doesn't answer any questions appropriately.    Review of systems negative except stated above.    Objective:     Visit Vitals  BP 108/66   Pulse 81   Temp 98.1 ??F (36.7 ??C)   Resp 16   Ht 5\' 2"  (1.575 m)   Wt 49.9 kg (110 lb)   LMP  (LMP Unknown)   SpO2 95%   BMI 20.12 kg/m??      Oxygen Therapy  O2 Sat (%): 95 % (04/05/19 1120)  Pulse via Oximetry: 92 beats per minute (04/02/19 1901)  O2 Device: Room air (04/05/19 0855)      Intake and Output:     Intake/Output Summary (Last 24 hours) at 04/05/2019 1338  Last data filed at 04/05/2019 1120  Gross per 24 hour   Intake 1920 ml   Output 1350 ml   Net 570 ml         Physical Exam:   GENERAL: alert, cooperative, no distress, appears stated age  EYE: conjunctivae/corneas clear. PERRL.  THROAT & NECK: normal and no erythema or exudates noted.   LUNG: clear to auscultation bilaterally  HEART: regular rate and rhythm, S1S2, no murmur, no JVD  ABDOMEN: soft, non-distended. Bowel sounds normal.   EXTREMITIES:  No edema, 2+ pedal/radial pulses bilaterally  SKIN: no rash or abnormalities  NEUROLOGIC: Alert. Cranial nerves 2-12 grossly intact.    Lab/Data Review:  No results found for this or any previous visit (from the past 24 hour(s)).    SARS-CoV-2 Lab Results  "Novel Coronavirus" Test: No results found for: COV2NT   "Emergent Disease" Test: No results found for: EDPR  "SARS-COV-2" Test: No results found for: XGCOVT   "Precision Labs" Test: No results found for: RSLT  Rapid Test: No results found for: COVR        Imaging:  Ct Head Wo Cont    Result Date: 04/02/2019  CT HEAD WITHOUT CONTRAST. INDICATION: Altered mental status COMPARISON: None.  TECHNIQUE:   5 mm axial scans from the skull base to the vertex.  Our CT scanners use one or more of the following:  Automated exposure control, adjustment of the mA and or kV according to patient size, iterative reconstruction. FINDINGS:  No acute intraparenchymal hemorrhage or abnormal extra-axial fluid collection.  The ventricles are normal size.  No midline shift or mass effect. Included portion of the paranasal sinuses and orbits grossly unremarkable.     IMPRESSION:  Negative for acute intracranial abnormality.     Xr Chest Port    Result Date: 04/02/2019  CHEST X-RAY, one view. HISTORY:  Altered mental status. TECHNIQUE:  AP upright portable view. COMPARISON: 29 Dec 2017 FINDINGS:   -The lungs: are clear. -The heart size: is normal. -The costophrenic angles: are sharp. -The pulmonary vasculature: is unremarkable. -Included portion of the upper abdomen: is unremarkable. -Bones: No gross bony lesions. -  Other: None.     IMPRESSION:  Negative for acute change.     Ir Change Uret Tube Via Ileal Conduit    Result Date: 03/21/2019  Title: Right retrograde nephroureteral drain exchange. Indication: 7 -year-old female with history of ileal conduit and ureteral obstruction presents for nephroureteral drain exchange.  Consent: Informed written and oral consent was obtained from the patient after explanation of benefits and risks of procedure (including, but not limited to: infection, hemorrhage, loss of access).  The Patient's questions were answered to their satisfaction.  The patient stated understanding and requested that we proceed.  Procedure:  Maximal sterile barrier technique (including:  cap, mask, sterile gown, sterile gloves, sterile sheet, hand hygiene, and  chlorhexidene for cutaneous antisepsis) were used.  With the patient prone the skin around the retrograde nephroureteral drain  was prepped and draped in the standard fashion. Using fluoroscopic guidance, the indwelling right retrograde nephroureteral catheter was removed over a Bentson wire.  A new 12 French retrograde nephroureteral catheter (biliary type) was positioned through the ileoconduit with the retention loop in the right renal pelvis.  Contrast was administered confirming appropriate position. An ostomy bag was placed over the ileal conduit with the catheter in place. Complications: None. Radiation Exposure Indices: Cumulative dose: 4 mGy Contrast: 8 mL Isovue 300 Medications: None. Findings: The old retrograde nephroureteral drain was occluded due to encrustation. Mild right hydronephrosis. New retrograde infrarenal drain in appropriate positioning..     Impression: Successful right retrograde nephroureteral drain exchange via an ileal conduit, 12 [place]. The old catheter was occluded with mild right hydronephrosis. Recommend return for change in 6 weeks given catheter encrustations.    Ir Nephroureteral Perc Rt Plc Cath New Access  Si    Result Date: 03/29/2019  PROCEDURE: Genitourinary catheter placement Procedural Personnel Attending physician(s): Vita Erm, M.D. Pre-procedure diagnosis: 73 year old woman with chronic right ureteral pelvic junction stenosis/occlusion, history of bladder cancer status post cystectomy with ileal conduit creation.  Chronic retrograde right nephroureteral drain inadvertently removed by the patient yesterday.  2 week history of decreased appetite, decreased oral intake, worsening cognition in a patient with underlying dementia.  Retrograde nephroureteral drain catheter most recently exchanged 03/21/2019. Post-procedure diagnosis: Same Indication: Urinary obstruction.  Placement of new anterograde nephroureteral drainage  catheter replaced the disc lodged retrograde nephroureteral catheter. Catheter(s) placed because the previous catheter(s) became dislodged within 30 days of placement (QCDR): Yes, one catheter Additional clinical history: None Complications: No immediate complications.     IMPRESSION: Successful percutaneous access to the Right kidney with nephroureteral drain placement.  Plan:  One hour bedrest recovery and then discharge home.  The nephroureteral drain is capped.  A spare bag is sent with the patient's daughter, her primary caregiver.  Return in about 2 months for drain maintenance.  If all goes well, anticipate drain exchange every 3 months.  Recommend no IV at the next visit. Recommend oral Valium and oxycodone..  _______________________________________________________________ PROCEDURE SUMMARY - Target organ: Unilateral native kidney - Image-guided placement of genitourinary catheter(s) - Additional procedure(s): None PROCEDURE DETAILS: Pre-procedure Consent:  Informed written and oral consent for the procedure was obtained after explanation of risks (including, but not limitted to: Infection, hemorrhage, kidney injury, lung injury, intestine injury), benefits and alternatives.  The patient's questions were answered to their satisfaction.  They stated understanding and requested that we proceed. Final verification:  A time-out identifying the patient and planned procedure was performed prior to the procedure.  Preparation:  Maximal sterile barrier technique (including:  cap, mask, sterile gown, sterile gloves, sterile sheet, hand hygiene, and cutaneous antisepsis) was used.  Anesthesia/sedation Level of anesthesia/sedation:  Moderate sedation (conscious sedation) Anesthesia/sedation administered by:  Independent trained observer under attending supervision with continuous monitoring of the patient?s level of consciousness and physiologic status  Total intra-service sedation time (minutes):  29 Right genitourinary catheter placement Local anesthesia was administered.  A needle was advanced into the dilated upper pole calyx under ultrasound guidance.  A small amount of contrast was administered in order to opacify the collecting system and proximal ureter.  A wire was passed down the ureter into the bladder, the tract was serially dilated, and a nephroureteral tube was placed.  Contrast injection was not performed due to suspected infection. Genitourinary catheter placed:  10.2-French 24 cm long urethane cope nephroureterostomy catheter Findings:  Marked right hydronephrosis and chronic ureteral pelvic junction stenosis.  Debris fills the right collecting system and proximal ureter.  The distal pigtail was formed in the ileal conduit.  The proximal pigtail was formed in the renal pelvis. External catheter securement:  Non-absorbable suture and adhesive anchoring device Contrast Contrast agent:  IsoVue 300 Contrast volume (mL):  4 Radiation Dose Reference Air Kerma (Ka,r) :  4 mGy Dose Area Product/Kerma Area Product (DAP/KAP/PKA) :  45.24 cGy-cm2 Fluoroscopy Exposure Time :  1 minute 6 seconds Fluorographic Images :  4 Additional Details Additional description of procedure:  None Equipment details:  None Specimens removed:  None. A sample was sent for analysis. Estimated blood loss (mL):  Less than 10 Standardized report: [email or web address] Attestation Signer name:  Vita Erm, M.D. I attest that I performed the entire procedure. I reviewed the stored images and agree with the report as written.     No results found for this visit on 04/02/19.    Cultures:  All Micro Results     Procedure Component Value Units Date/Time    CULTURE, BLOOD [[id number]] Collected:  04/03/19 1439    Order Status:  Completed Specimen:  Blood Updated:  04/05/19 0727     Special Requests: --        RIGHT  HAND       Culture result: NO GROWTH 2 DAYS        CULTURE, BLOOD [[id number]] Collected:  04/02/19 1655    Order Status:  Completed Specimen:  Blood Updated:  04/05/19 0727     Special Requests: --        LEFT  Antecubital       Culture result: NO GROWTH 3 DAYS       CULTURE, URINE [[id number]]  (Abnormal) Collected:  04/02/19 1628    Order Status:  Completed Specimen:  Urine from Clean catch Updated:  04/05/19 0718     Special Requests: NO SPECIAL REQUESTS        Culture result:       >100,000 COLONIES/mL GRAM NEGATIVE RODS IDENTIFICATION AND SUSCEPTIBILITY TO FOLLOW                  CULTURE IN PROGRESS,FURTHER UPDATES TO FOLLOW          CULTURE, BLOOD [[id number]] Collected:  04/02/19 1655    Order Status:  Canceled Specimen:  Blood           Assessment/Plan:     Principal Problem:    Urinary tract infection due to ESBL Klebsiella (04/03/2019)  - UA 8/8 with large leukocyte esterase and blood  - Urine culture 8/4 with >100,000 ESBL Klebsiella  - Continue Levaquin  based on culture  - Repeat culture 8/8 with >100,000 GNR    Active Problems:    Hypotension (04/02/2019)  - Resolved  - Last BP 115/71  - Continue IVFs      AKI (acute kidney injury) ([place]) (04/02/2019)  - Resolved  - Continue IVFs      Acute metabolic encephalopathy (02/24/7105)  - Unsure if progression of dementia vs UTI  - Talking more today so ?improving  - Urine culture 8/4 with >100,000 ESBL Klebsiella  - Continue Levaquin based on culture  - TSH normal  - Electrolytes unremarkable      Severe sepsis with acute organ dysfunction ([place]) (04/03/2019)  - In ER patient with HR 98 + RR 22 + probable UTI + encephalopathy  - HR now 88  - Continue Levaquin  - Urine culture 8/4 with >100,000 ESBL Klebsiella  - Repeat urine culture with >100,000 GNR  - CXR unremarkable  - Blood cultures NGTD  - Lactic acid now normal  - Procalcitonin 0.15      Lactic acidosis (04/03/2019)  - Resolved  - Lactic Acid 2.4 --> 1.5  - Continue IVFs      Frontotemporal dementia ([place]) (02/01/2015)  - Continue Aricept  - Continue Depakote   - Continue Trazodone      Asthma (10/27/2012)      GERD without esophagitis (10/27/2012)  - Continue Pepcid      Acquired hypothyroidism (08/29/2015)      Depression with anxiety (02/01/2015)      Hypoalbuminemia (04/03/2019)    Today's Plan: Continue Levaquin. Continue IVFs since not eating.    DIET MECHANICAL SOFT  DIET NUTRITIONAL SUPPLEMENTS All Meals; Other ( )  DIET NUTRITIONAL SUPPLEMENTS All Meals; Ensure Enlive    DVT Prophylaxis: Lovenox    Discharge Plan: TBD      Signed By: Faythe Casa, DO     April 05, 2019

## 2019-04-05 NOTE — Progress Notes (Signed)
Problem: Mobility Impaired (Adult and Pediatric)  Goal: *Acute Goals and Plan of Care (Insert Text)  Outcome: Progressing Towards Goal  Note:     LTG:  (1.)Cathy Jackson will move from supine to sit and sit to supine, scoot up and down, and roll side to side with STAND BY ASSIST within 7 treatment day(s).    (2.)Cathy Jackson will transfer from bed to chair and chair to bed with CONTACT GUARD ASSIST using the least restrictive device within 7 treatment day(s).    (3.)Cathy Jackson will ambulate with MINIMAL ASSIST for 50+ feet with the least restrictive device within 7 treatment day(s).   (4.)Cathy Jackson will perform exercises per HEP for 10+ minutes to improve strength and mobility within 7 days.  ________________________________________________________________________________________________       PHYSICAL THERAPY: Daily Note and PM 04/05/2019  INPATIENT: PT Visit Days : 2  Payor: AETNA MEDICARE / Plan: [employer] AET[employer] MEDICARE ADVANTAGE / Product Type: Managed Care Medicare /       NAME/AGE/GENDER: Cathy Jackson is a 73 y.o. female   PRIMARY DIAGNOSIS: Altered mental status, unspecified [R41.82]  Hypotension [I95.9]  AKI (acute kidney injury) ([place]) [N17.9] Urinary tract infection due to ESBL Klebsiella Urinary tract infection due to ESBL Klebsiella       ICD-10: Treatment Diagnosis:    ?? Generalized Muscle Weakness (M62.81)  ?? Difficulty in walking, Not elsewhere classified (R26.2)  ?? Other abnormalities of gait and mobility (R26.89)   Precaution/Allergies:  Patient has no known allergies.      ASSESSMENT:     Cathy Jackson is a 73 year old female admitted from home with altered mental status/encephalopathy, AKI. Has history of frontotemporal dementia. She is very soft spoken and confused.  Unclear her home situation or prior level of mobility. Requires mod assist for bed mobility/transfer to sitting. Left trunk lean in sitting and needs mod assist for seated  mobility, positioning.  Head and neck are turned to the left and the patient grimaces when the head/neck is touched or turned.  Sit to stand with moderate assist several times and the patient did very well with standing.  Patient sat edge of bed longer than expected and tolerated it very well.  Patient is returned to supine in bed and positioned for comfort.  Sitter at bedside.  Cathy Jackson is curretly quite weak and debilitated and is limited by mental status. Unclear her baseline level of function. She will benefit from continued acute PT to address deficits/maximize independence with mobility. Anticipate need for rehab at dc.    This section established at most recent assessment   PROBLEM LIST (Impairments causing functional limitations):  1. Decreased Strength  2. Decreased ADL/Functional Activities  3. Decreased Transfer Abilities  4. Decreased Ambulation Ability/Technique  5. Decreased Balance  6. Decreased Activity Tolerance  7. Decreased Cognition   INTERVENTIONS PLANNED: (Benefits and precautions of physical therapy have been discussed with the patient.)  1. Balance Exercise  2. Bed Mobility  3. Gait Training  4. Home Exercise Program (HEP)  5. Therapeutic Activites  6. Therapeutic Exercise/Strengthening  7. Transfer Training     TREATMENT PLAN: Frequency/Duration: 3 times a week for duration of hospital stay  Rehabilitation Potential For Stated Goals: Good     REHAB RECOMMENDATIONS (at time of discharge pending progress):    Placement:  It is my opinion, based on this patient's performance to date, that Cathy Jackson may benefit from intensive therapy at a [facility] after discharge due  to the functional deficits listed above that are likely to improve with skilled rehabilitation and concerns that he/she may be unsafe to be unsupervised at home due to weakness, need for heavy assist with all mobility, fall risk .  Equipment:   ? tbd              HISTORY:    History of Present Injury/Illness (Reason for Referral):  Per H&P, "Patient is a 73 y/o female with hx frontotemporal dementia, bladder cancer, s/p ileal conduit, s/p nephro-ureteral tube placement 03/29/19, hypothyroid, gerd, UTI who presents from home with worsening weakness, falling out of bed, worsening behavior, and now disoriented x 4. Patient cannot provide any meaningful history. Blood pressure initially low in ED 91/50. Responded to IV fluids. UA does not appear infected. Patient is afebrile. Has a lactic acid of 2.4. procalcitonin 0.15. WBC ct only 9.9. Bun 34/Cr 1.38. TSH 1.870. head CT is negative. Chest xray is negative. Hospitalist service consulted for admission for AKI and acute encephalopathy"    Past Medical History/Comorbidities:   Cathy Jackson  has a past medical history of Abdominal pain, unspecified site (10/27/2012), Arthritis, Asthma (10/27/2012), Bladder cancer ([place]), Depression, Dysuria (10/27/2012), Environmental allergies, GERD (gastroesophageal reflux disease) (10/27/2012), History of kidney stones, Hypothyroidism, Other "heavy-for-dates" infants, Unspecified disorder of bladder (10/27/2012), Urethral caruncle (10/27/2012), Urinary frequency (10/27/2012), and Urinary tract infection, site not specified (10/27/2012). She also has no past medical history of Difficult intubation, Malignant hyperthermia due to anesthesia, Nausea & vomiting, Pseudocholinesterase deficiency, or Unspecified adverse effect of anesthesia.  Cathy Jackson  has a past surgical history that includes hx breast biopsy (Left, 2000); hx cesarean section; hx tubal ligation; pr close cystostomy; hx wrist fracture tx (Left); hx cholecystectomy; hx nephrostomy (2014); ir exchange nephro perc rt si (10/23/2017); ir convert nephro perc rt to nephroureteral cath existing si (11/12/2017); ir change internal ureteral stent si (02/15/2018); ir change internal ureteral stent si (04/15/2018); ir change  internal ureteral stent si (06/14/2018); ir change uret tube via ileal conduit (09/07/2018); ir change uret tube via ileal conduit (12/20/2018); ir change uret tube via ileal conduit (03/21/2019); and ir nephroureteral perc rt plc cath new access  si (03/29/2019).  Social History/Living Environment:   Home Environment: Private residence  # Steps to Enter: 0(ramp)  One/Two Story Residence: Two story, live on 1st floor  Living Alone: No  Support Systems: Child(ren)  Patient Expects to be Discharged to:: Skilled nursing facility  Current DME Used/Available at Home: None  Prior Level of Function/Work/Activity:  Unknown- no information in chart and pt confused/disoriented today, unable to answer questions or provide history.     Number of Personal Factors/Comorbidities that affect the Plan of Care: 3+: HIGH COMPLEXITY   EXAMINATION:   Most Recent Physical Functioning:   Gross Assessment:                  Posture:     Balance:  Sitting: Impaired  Sitting - Static: Fair (occasional);Poor (constant support);Prop sitting  Sitting - Dynamic: Poor (constant support)  Standing: Impaired  Standing - Static: Poor Bed Mobility:  Rolling: Moderate assistance;Assist x1  Supine to Sit: Moderate assistance;Assist x1  Sit to Supine: Moderate assistance;Assist x2  Scooting: Moderate assistance;Assist x2  Wheelchair Mobility:     Transfers:  Sit to Stand: Moderate assistance;Assist x1  Stand to Sit: Moderate assistance;Assist x1  Gait:            Body Structures Involved:  1. Eyes and Ears  2. Muscles  Body Functions Affected:  1. Mental  2. Neuromusculoskeletal  3. Movement Related Activities and Participation Affected:  1. General Tasks and Demands  2. Mobility  3. Domestic Life  4. Community, Social and [employer]   Number of elements that affect the Plan of Care: 4+: HIGH COMPLEXITY   CLINICAL PRESENTATION:   Presentation: Evolving clinical presentation with unstable and unpredictable characteristics: HIGH COMPLEXITY    CLINICAL DECISION MAKING:   [facility] Home	[place] AM-PAC??? ???6 Clicks???   Basic Mobility Inpatient Short Form  How much difficulty does the patient currently have... Unable A Lot A Little None   1.  Turning over in bed (including adjusting bedclothes, sheets and blankets)?   []  1   [x]  2   []  3   []  4   2.  Sitting down on and standing up from a chair with arms ( e.g., wheelchair, bedside commode, etc.)   []  1   [x]  2   []  3   []  4   3.  Moving from lying on back to sitting on the side of the bed?   []  1   [x]  2   []  3   []  4   How much help from another person does the patient currently need... Total A Lot A Little None   4.  Moving to and from a bed to a chair (including a wheelchair)?   []  1   [x]  2   []  3   []  4   5.  Need to walk in hospital room?   [x]  1   []  2   []  3   []  4   6.  Climbing 3-5 steps with a railing?   [x]  1   []  2   []  3   []  4   ?? 2007, [employer], under license to [employer]. All rights reserved      Score:  Initial: 10 Most Recent: X (Date: -- )    Interpretation of Tool:  Represents activities that are increasingly more difficult (i.e. Bed mobility, Transfers, Gait).    Medical Necessity:     ?? Patient demonstrates   ?? fair  ??  rehab potential due to higher previous functional level.  Reason for Services/Other Comments:  ?? Patient   ?? continues to demonstrate capacity to improve strength, mobility, balance, transfers, and activity tolerance which will   ?? increase independence, decrease amount of assistance required from caregiver, and increase safety  ?? .   Use of outcome tool(s) and clinical judgement create a POC that gives a: Difficult prediction of patient's progress: HIGH COMPLEXITY            TREATMENT:   (In addition to Assessment/Re-Assessment sessions the following treatments were rendered)   Pre-treatment Symptoms/Complaints:  Confused, very soft spoken  Pain: Initial: no number given however the patient has facial grimacing with head and neck movement      Post Session:  0/10 FLACC     Therapeutic Activity: (   23 minutes ):  Therapeutic activities including Bed mobility (rolling, scooting and supine <-> sit transfers), seated static/dynamic balance and functional activities, sit-stand transfers , standing pre-gait and balance activities to improve mobility, strength, balance, coordination, and activity tolerance .  Required minimal to moderate assist    to promote static and dynamic balance in standing and promote motor control of bilateral, lower extremity(s).     Braces/Orthotics/Lines/Etc:   ?? IV  ?? O2 Device: Room air  Treatment/Session  Assessment:    ?? Response to Treatment:  pt performs mobility with mod-max A  ?? Interdisciplinary Collaboration:   o Physical Therapy Assistant  o Registered Nurse  ?? After treatment position/precautions:   o Supine in bed  o Bed/Chair-wheels locked  o Bed in low position  o Call light within reach  o RN notified  o sitter   ?? Compliance with Program/Exercises: Will assess as treatment progresses  ?? Recommendations/Intent for next treatment session:  "Next visit will focus on advancements to more challenging activities and reduction in assistance provided".  Total Treatment Duration:  PT Patient Time In/Time Out  Time In: 1407  Time Out: 1430    Tangie T Stoddard-Choice, PTA

## 2019-04-05 NOTE — Progress Notes (Signed)
Patient complaining of neck pain and stiffness. Has been laying with head turned in same position for awhile. Encouraged her to turn the other way and move head around more. Notified Dr. Stanford Breed of patients pain. No new orders. Will continue to monitor.

## 2019-04-05 NOTE — Telephone Encounter (Signed)
Spoke with daughter and patient is going into short term rehab. Daughter is hoping for a Long term rehab due to complications. Daughter stated that she will call the office to schedule an appointment when it is a better time

## 2019-04-05 NOTE — Progress Notes (Signed)
Problem: Risk for Spread of Infection  Goal: Prevent transmission of infectious organism to others  Description: Prevent the transmission of infectious organisms to other patients, staff members, and visitors.  Outcome: Progressing Towards Goal

## 2019-04-06 LAB — PLEASE READ & DOCUMENT PPD TEST IN 72 HRS
PPD: NEGATIVE Negative
mm Induration: 0 mm (ref 0–5)

## 2019-04-06 LAB — CULTURE, URINE: Culture result:: >100000 — AB

## 2019-04-06 MED ORDER — TRIMETHOPRIM-SULFAMETHOXAZOLE 160 MG-800 MG TAB
160-800 mg | ORAL_TABLET | Freq: Two times a day (BID) | ORAL | 0 refills | Status: AC
Start: 2019-04-06 — End: 2019-04-11

## 2019-04-06 MED ORDER — TRAZODONE 50 MG TAB
50 mg | ORAL_TABLET | Freq: Every evening | ORAL | 0 refills | Status: AC
Start: 2019-04-06 — End: ?

## 2019-04-06 MED ORDER — TRIMETHOPRIM-SULFAMETHOXAZOLE 160 MG-800 MG TAB
160-800 mg | Freq: Two times a day (BID) | ORAL | Status: DC
Start: 2019-04-06 — End: 2019-04-06
  Administered 2019-04-06: 15:00:00 via ORAL

## 2019-04-06 MED FILL — FAMOTIDINE 20 MG TAB: 20 mg | ORAL | Qty: 1

## 2019-04-06 MED FILL — DONEPEZIL 5 MG TAB: 5 mg | ORAL | Qty: 2

## 2019-04-06 MED FILL — FERROUS SULFATE 325 MG (65 MG ELEMENTAL IRON) TAB: 325 mg (65 mg iron) | ORAL | Qty: 1

## 2019-04-06 MED FILL — OYSTER SHELL CALCIUM 500  500 MG CALCIUM (1,250 MG) TABLET: 500 mg calcium (1,250 mg) | ORAL | Qty: 1

## 2019-04-06 MED FILL — ATORVASTATIN 10 MG TAB: 10 mg | ORAL | Qty: 1

## 2019-04-06 MED FILL — TRAZODONE 50 MG TAB: 50 mg | ORAL | Qty: 1

## 2019-04-06 MED FILL — LOVENOX 30 MG/0.3 ML SUB-Q SYRINGE: 30 mg/0.3 mL | SUBCUTANEOUS | Qty: 0.3

## 2019-04-06 MED FILL — STOOL SOFTENER-STIMULANT LAXATIVE 8.6 MG-50 MG TABLET: ORAL | Qty: 1

## 2019-04-06 MED FILL — TRIMETHOPRIM-SULFAMETHOXAZOLE 160 MG-800 MG TAB: 160-800 mg | ORAL | Qty: 1

## 2019-04-06 MED FILL — DIVALPROEX 500 MG 24 HR TAB: 500 mg | ORAL | Qty: 1

## 2019-04-06 MED FILL — PAROXETINE 20 MG TAB: 20 mg | ORAL | Qty: 1

## 2019-04-06 MED FILL — VITAMIN C 500 MG TABLET: 500 mg | ORAL | Qty: 2

## 2019-04-06 NOTE — Progress Notes (Signed)
[facility] ambulance running late, estimated pick up time 1800. [facility] called, spoke to [place] and notified of delay.

## 2019-04-06 NOTE — Discharge Summary (Signed)
Hospitalist Discharge Summary     Patient ID:  Cathy Jackson  [id number]  73 y.o.  December 17, 1945  Admit date: 04/02/2019  4:16 PM  Discharge date and time: 04/06/2019  Attending: Faythe Casa, DO  PCP:  Wilfred Lacy, NP  Treatment Team: Attending Provider: Faythe Casa, DO; Utilization Review: Larena Sox; Care Manager: Riccardo Dubin, RN; Physical Therapy Assistant: Carla Drape, PTA    Principal Diagnosis Urinary tract infection due to ESBL [place] [facility] Problems as of 04/06/2019 Date Reviewed: 06/20/2018          Codes Class Noted - Resolved POA    * (Principal) Urinary tract infection due to ESBL Klebsiella ICD-10-CM: N39.0, B96.89  ICD-9-CM: 599.0, 041.84  04/03/2019 - Present Yes        Severe sepsis with acute organ dysfunction [facility]) ICD-10-CM: A41.9, R65.20  ICD-9-CM: 038.9, 995.92  04/03/2019 - Present Yes        Hypotension ICD-10-CM: I95.9  ICD-9-CM: 458.9  04/02/2019 - Present Yes        AKI (acute kidney injury) ([place]) ICD-10-CM: N17.9  ICD-9-CM: 584.9  04/02/2019 - Present Yes        Acute metabolic encephalopathy LFY-10-FB: G93.41  ICD-9-CM: 348.31  04/02/2019 - Present Yes        Lactic acidosis ICD-10-CM: E87.2  ICD-9-CM: 276.2  04/03/2019 - Present Yes        Frontotemporal dementia ([place]) ICD-10-CM: G31.09, F02.80  ICD-9-CM: 331.19  02/01/2015 - Present Yes    Overview Signed 10/20/2017 10:25 AM by Wilfred Lacy, NP     Last Assessment & Plan:   Disinhibited, agitated, aggressive, violent at times. Will add depakote to try to calm her behaviors down. Might need to adjust namenda, aricept, and paxil, too but will start with depakote. Provided information about the drug, anticipated effects, and possible side effects. Family will call me next week and let me know how she is doing, we may need to increase the dose, but since she is small, we will need to monitor for fall risk.      Daughter inquired about PACE program. She isnt medicaid eligible, I dont think, but out of pocket might be an option for daytime care. She is also going to look into senior day programs. I will refer her daughter to the [employer] for additional resources. Might need to send daughter REACH at next appt, too.              Hypoalbuminemia ICD-10-CM: E88.09  ICD-9-CM: 273.8  04/03/2019 - Present Yes        Acquired hypothyroidism ICD-10-CM: E03.9  ICD-9-CM: 244.9  08/29/2015 - Present Yes        Depression with anxiety ICD-10-CM: F41.8  ICD-9-CM: 300.4  02/01/2015 - Present Yes    Overview Signed 10/20/2017 10:25 AM by Wilfred Lacy, NP     Last Assessment & Plan:   History of depression, anxiety, and agitation. Currently taking Celexa 20 mg- for about 3-4 years. Tolerating well and denies ADEs. Plan: Continue Celexa 20 mg daily.              Asthma ICD-10-CM: J45.909  ICD-9-CM: 493.90  10/27/2012 - Present Yes        GERD without esophagitis ICD-10-CM: K21.9  ICD-9-CM: 530.81  10/27/2012 - Present Yes              Hospital Course:  Please refer to the admission H&P for details of presentation.  In summary, the patient is a 73 year old CF with PMH of frontotemporal dementia, bladder cancer s/p ileal conduit & s/p nephro-ureteral tube placement 03/29/19, hypothyroidism, gerd, and recurrent UTIs who presented from home with worsening weakness, falling out of bed, worsening behavior, and disorientation. Urine culture from 03/29/19 showed ESBL so she was started on Levaquin. She started to improve and eventually returned to baseline. Repeat culture on 8/8 showed resistance to Levaquin so she was changed to Bactrim x 5 days. She remained stable and was discharged to SNF for further care.    Significant Diagnostic Studies:    Labs: Results:       Chemistry No results for input(s): GLU, NA, K, CL, CO2, BUN, CREA, CA, AGAP, BUCR, TBIL, AP, TP, ALB, GLOB, AGRAT in the last 72 hours.    No lab exists for component: GPT    CBC w/Diff No results for input(s): WBC, RBC, HGB, HCT, PLT, GRANS, LYMPH, EOS, HGBEXT, HCTEXT, PLTEXT in the last 72 hours.   Cardiac Enzymes No results for input(s): CPK, CKND1, MYO in the last 72 hours.    No lab exists for component: CKRMB, TROIP   Coagulation No results for input(s): PTP, INR, APTT, INREXT in the last 72 hours.    Lipid Panel Lab Results   Component Value Date/Time    Cholesterol, total 146 03/01/2018 08:57 AM    HDL Cholesterol 51 03/01/2018 08:57 AM    LDL, calculated 67 03/01/2018 08:57 AM    VLDL, calculated 28 03/01/2018 08:57 AM    Triglyceride 138 03/01/2018 08:57 AM      BNP No results for input(s): BNPP in the last 72 hours.   Liver Enzymes No results for input(s): TP, ALB, TBIL, AP in the last 72 hours.    No lab exists for component: SGOT, GPT, DBIL   Thyroid Studies Lab Results   Component Value Date/Time    TSH 1.870 04/02/2019 04:56 PM            Imaging:  Ct Head Wo Cont    Result Date: 04/02/2019  IMPRESSION:  Negative for acute intracranial abnormality.     Xr Chest Port    Result Date: 04/02/2019  IMPRESSION:  Negative for acute change.       Microbiology/Cultures:  All Micro Results     Procedure Component Value Units Date/Time    CULTURE, BLOOD [[id number]] Collected:  04/03/19 1439    Order Status:  Completed Specimen:  Blood Updated:  04/06/19 0843     Special Requests: --        RIGHT  HAND       Culture result: NO GROWTH 3 DAYS       CULTURE, BLOOD [[id number]] Collected:  04/02/19 1655    Order Status:  Completed Specimen:  Blood Updated:  04/06/19 0843     Special Requests: --        LEFT  Antecubital       Culture result: NO GROWTH 4 DAYS       CULTURE, URINE [[id number]]  (Abnormal)  (Susceptibility) Collected:  04/02/19 1628    Order Status:  Completed Specimen:  Urine from Clean catch Updated:  04/06/19 0743     Special Requests: NO SPECIAL REQUESTS        Culture result:       >100,000 COLONIES/mL KLEBSIELLA PNEUMONIAE ** (EXTENDED SPECTRUM BETA LACTAMASE PRODUCER) **             PATIENT IS A KNOWN ESBL  CULTURE, BLOOD [[id number]] Collected:  04/02/19 1655    Order Status:  Canceled Specimen:  Blood           Discharge Exam:  Visit Vitals  BP 109/64   Pulse 88   Temp 98.1 ??F (36.7 ??C)   Resp 18   Ht 5\' 2"  (1.575 m)   Wt 49.9 kg (110 lb)   LMP  (LMP Unknown)   SpO2 93%   BMI 20.12 kg/m??     General appearance: alert, cooperative, no distress, appears stated age  Lungs: clear to auscultation bilaterally  Heart: regular rate and rhythm, S1, S2 normal, no murmur, click, rub or gallop  Abdomen: soft, non-tender. Bowel sounds normal. No masses,  no organomegaly  Extremities: no cyanosis or edema  Neurologic: Grossly normal, confused    Disposition: SNF  Discharge Condition: stable  Patient Instructions:   Current Discharge Medication List      START taking these medications    Details   trimethoprim-sulfamethoxazole (BACTRIM DS, SEPTRA DS) 160-800 mg per tablet Take 1 Tab by mouth every twelve (12) hours for 5 days.  Qty: 9 Tab, Refills: 0         CONTINUE these medications which have CHANGED    Details   traZODone (DESYREL) 50 mg tablet Take 1 Tab by mouth nightly.  Qty: 3 Tab, Refills: 0         CONTINUE these medications which have NOT CHANGED    Details   simvastatin (ZOCOR) 20 mg tablet TAKE 1 TABLET BY MOUTH EVERY DAY AT NIGHT  Qty: 90 Tab, Refills: 2      PARoxetine (PAXIL) 20 mg tablet Take 1 Tab by mouth daily.  Qty: 90 Tab, Refills: 2      divalproex ER (DEPAKOTE ER) 500 mg ER tablet Take 500 mg by mouth.      food supplemt, lactose-reduced (ENSURE ACTIVE CLEAR) liqd Take 237 mL by mouth four (4) times daily.  Qty: 30 Can, Refills: 3    Associated Diagnoses: Weight loss      calcium carb-vitamin D3-vit K2 600 mg-1,000 unit-90 mcg tab Take  by mouth.      senna-docusate (PERICOLACE) 8.6-50 mg per tablet Take  by mouth.      calcium carbonate (OS-CAL) 500 mg calcium (1,250 mg) tablet Take  by mouth daily.       ascorbic acid, vitamin C, (VITAMIN C) 1,000 mg tablet Take  by mouth.      ferrous sulfate (IRON) 325 mg (65 mg iron) tablet Take  by mouth Daily (before breakfast).         STOP taking these medications       donepezil (ARICEPT) 10 mg tablet Comments:   Reason for Stopping:         famotidine (PEPCID) 20 mg tablet Comments:   Reason for Stopping:               Activity: Activity as tolerated  Diet: Regular, Mechanical Soft Diet, Ensure Enlive with each meal  Wound Care: None needed    Follow-up  ??   PCP in one week  Time spent to discharge patient 35 minutes  Signed:  Faythe Casa, DO  04/06/2019  3:06 PM

## 2019-04-06 NOTE — Progress Notes (Signed)
Report called to [facility]. Spoke to [employer]. Patient to be transported via EMS. Urostomy bag emptied and intact in place, R nephrostomy tube clamped, dsng clean, dry and intact. Patient's daughter informed of transfer by case management.

## 2019-04-06 NOTE — Progress Notes (Signed)
Hospitalist Progress Note    Patient: Cathy Jackson MRN: [id number]  SSN: [id number]    Date of Birth: 1945/12/18  Age: 73 y.o.  Sex: female      Admit Date: 04/02/2019    LOS: 4 days     Subjective:     73 year old CF with PMH of frontotemporal dementia, bladder cancer s/p ileal conduit & s/p nephro-ureteral tube placement 03/29/19, hypothyroid, gerd, UTI who presents from home with worsening weakness, falling out of bed, worsening behavior, and now disoriented x 4.    8/12 - She is alert and calm today. Complains of right sided neck stiffness. Doesn't answer any questions appropriately.    Review of systems negative except stated above.    Objective:     Visit Vitals  BP 118/70   Pulse 76   Temp 97.8 ??F (36.6 ??C)   Resp 18   Ht 5\' 2"  (1.575 m)   Wt 49.9 kg (110 lb)   LMP  (LMP Unknown)   SpO2 94%   BMI 20.12 kg/m??      Oxygen Therapy  O2 Sat (%): 94 % (04/06/19 0716)  Pulse via Oximetry: 92 beats per minute (04/02/19 1901)  O2 Device: Room air (04/05/19 1400)      Intake and Output:     Intake/Output Summary (Last 24 hours) at 04/06/2019 0829  Last data filed at 04/06/2019 0646  Gross per 24 hour   Intake 2331.42 ml   Output 1650 ml   Net 681.42 ml         Physical Exam:   GENERAL: alert, cooperative, no distress, appears stated age  EYE: conjunctivae/corneas clear. PERRL.  THROAT & NECK: normal and no erythema or exudates noted.   LUNG: clear to auscultation bilaterally  HEART: regular rate and rhythm, S1S2, no murmur, no JVD  ABDOMEN: soft, non-distended. Bowel sounds normal.   EXTREMITIES:  No edema, 2+ pedal/radial pulses bilaterally  SKIN: no rash or abnormalities  NEUROLOGIC: Alert. Cranial nerves 2-12 grossly intact.    Lab/Data Review:  Recent Results (from the past 24 hour(s))   PLEASE READ & DOCUMENT PPD TEST IN 72 HRS    Collection Time: 04/05/19 10:45 PM   Result Value Ref Range    PPD Negative Negative    mm Induration 0 0 - 5 mm       SARS-CoV-2 Lab Results   "Novel Coronavirus" Test: No results found for: COV2NT   "Emergent Disease" Test: No results found for: EDPR  "SARS-COV-2" Test: No results found for: XGCOVT  "Precision Labs" Test: No results found for: RSLT  Rapid Test: No results found for: COVR        Imaging:  Ct Head Wo Cont    Result Date: 04/02/2019  CT HEAD WITHOUT CONTRAST. INDICATION: Altered mental status COMPARISON: None.  TECHNIQUE:   5 mm axial scans from the skull base to the vertex.  Our CT scanners use one or more of the following:  Automated exposure control, adjustment of the mA and or kV according to patient size, iterative reconstruction. FINDINGS:  No acute intraparenchymal hemorrhage or abnormal extra-axial fluid collection.  The ventricles are normal size.  No midline shift or mass effect. Included portion of the paranasal sinuses and orbits grossly unremarkable.     IMPRESSION:  Negative for acute intracranial abnormality.     Xr Chest Port    Result Date: 04/02/2019  CHEST X-RAY, one view. HISTORY:  Altered mental status. TECHNIQUE:  AP upright portable  view. COMPARISON: 29 Dec 2017 FINDINGS:   -The lungs: are clear. -The heart size: is normal. -The costophrenic angles: are sharp. -The pulmonary vasculature: is unremarkable. -Included portion of the upper abdomen: is unremarkable. -Bones: No gross bony lesions. -Other: None.     IMPRESSION:  Negative for acute change.     Ir Change Uret Tube Via Ileal Conduit    Result Date: 03/21/2019  Title: Right retrograde nephroureteral drain exchange. Indication: 39 -year-old female with history of ileal conduit and ureteral obstruction presents for nephroureteral drain exchange.  Consent: Informed written and oral consent was obtained from the patient after explanation of benefits and risks of procedure (including, but not limited to: infection, hemorrhage, loss of access).  The Patient's questions were answered to their satisfaction.  The patient stated understanding and requested that  we proceed.  Procedure:  Maximal sterile barrier technique (including:  cap, mask, sterile gown, sterile gloves, sterile sheet, hand hygiene, and chlorhexidene for cutaneous antisepsis) were used.  With the patient prone the skin around the retrograde nephroureteral drain  was prepped and draped in the standard fashion. Using fluoroscopic guidance, the indwelling right retrograde nephroureteral catheter was removed over a Bentson wire.  A new 12 French retrograde nephroureteral catheter (biliary type) was positioned through the ileoconduit with the retention loop in the right renal pelvis.  Contrast was administered confirming appropriate position. An ostomy bag was placed over the ileal conduit with the catheter in place. Complications: None. Radiation Exposure Indices: Cumulative dose: 4 mGy Contrast: 8 mL Isovue 300 Medications: None. Findings: The old retrograde nephroureteral drain was occluded due to encrustation. Mild right hydronephrosis. New retrograde infrarenal drain in appropriate positioning..     Impression: Successful right retrograde nephroureteral drain exchange via an ileal conduit, 12 [place]. The old catheter was occluded with mild right hydronephrosis. Recommend return for change in 6 weeks given catheter encrustations.    Ir Nephroureteral Perc Rt Plc Cath New Access  Si    Result Date: 03/29/2019  PROCEDURE: Genitourinary catheter placement Procedural Personnel Attending physician(s): Vita Erm, M.D. Pre-procedure diagnosis: 73 year old woman with chronic right ureteral pelvic junction stenosis/occlusion, history of bladder cancer status post cystectomy with ileal conduit creation.  Chronic retrograde right nephroureteral drain inadvertently removed by the patient yesterday.  2 week history of decreased appetite, decreased oral intake, worsening cognition in a patient with underlying dementia.  Retrograde nephroureteral drain catheter most recently  exchanged 03/21/2019. Post-procedure diagnosis: Same Indication: Urinary obstruction.  Placement of new anterograde nephroureteral drainage catheter replaced the disc lodged retrograde nephroureteral catheter. Catheter(s) placed because the previous catheter(s) became dislodged within 30 days of placement (QCDR): Yes, one catheter Additional clinical history: None Complications: No immediate complications.     IMPRESSION: Successful percutaneous access to the Right kidney with nephroureteral drain placement.  Plan:  One hour bedrest recovery and then discharge home.  The nephroureteral drain is capped.  A spare bag is sent with the patient's daughter, her primary caregiver.  Return in about 2 months for drain maintenance.  If all goes well, anticipate drain exchange every 3 months.  Recommend no IV at the next visit. Recommend oral Valium and oxycodone..  _______________________________________________________________ PROCEDURE SUMMARY - Target organ: Unilateral native kidney - Image-guided placement of genitourinary catheter(s) - Additional procedure(s): None PROCEDURE DETAILS: Pre-procedure Consent:  Informed written and oral consent for the procedure was obtained after explanation of risks (including, but not limitted to: Infection, hemorrhage, kidney injury, lung injury, intestine injury), benefits and alternatives.  The patient's questions  were answered to their satisfaction.  They stated understanding and requested that we proceed. Final verification:  A time-out identifying the patient and planned procedure was performed prior to the procedure.  Preparation:  Maximal sterile barrier technique (including:  cap, mask, sterile gown, sterile gloves, sterile sheet, hand hygiene, and cutaneous antisepsis) was used.  Anesthesia/sedation Level of anesthesia/sedation:  Moderate sedation (conscious sedation) Anesthesia/sedation administered by:  Independent trained observer under attending supervision with continuous  monitoring of the patient?s level of consciousness and physiologic status Total intra-service sedation time (minutes):  29 Right genitourinary catheter placement Local anesthesia was administered.  A needle was advanced into the dilated upper pole calyx under ultrasound guidance.  A small amount of contrast was administered in order to opacify the collecting system and proximal ureter.  A wire was passed down the ureter into the bladder, the tract was serially dilated, and a nephroureteral tube was placed.  Contrast injection was not performed due to suspected infection. Genitourinary catheter placed:  10.2-French 24 cm long urethane cope nephroureterostomy catheter Findings:  Marked right hydronephrosis and chronic ureteral pelvic junction stenosis.  Debris fills the right collecting system and proximal ureter.  The distal pigtail was formed in the ileal conduit.  The proximal pigtail was formed in the renal pelvis. External catheter securement:  Non-absorbable suture and adhesive anchoring device Contrast Contrast agent:  IsoVue 300 Contrast volume (mL):  4 Radiation Dose Reference Air Kerma (Ka,r) :  4 mGy Dose Area Product/Kerma Area Product (DAP/KAP/PKA) :  45.24 cGy-cm2 Fluoroscopy Exposure Time :  1 minute 6 seconds Fluorographic Images :  4 Additional Details Additional description of procedure:  None Equipment details:  None Specimens removed:  None. A sample was sent for analysis. Estimated blood loss (mL):  Less than 10 Standardized report: [email or web address] Attestation Signer name:  Vita Erm, M.D. I attest that I performed the entire procedure. I reviewed the stored images and agree with the report as written.     No results found for this visit on 04/02/19.    Cultures:  All Micro Results     Procedure Component Value Units Date/Time    CULTURE, URINE [[id number]]  (Abnormal)  (Susceptibility) Collected:  04/02/19 1628     Order Status:  Completed Specimen:  Urine from Clean catch Updated:  04/06/19 0743     Special Requests: NO SPECIAL REQUESTS        Culture result:       >100,000 COLONIES/mL KLEBSIELLA PNEUMONIAE ** (EXTENDED SPECTRUM BETA LACTAMASE PRODUCER) **            PATIENT IS A KNOWN ESBL       CULTURE, BLOOD [[id number]] Collected:  04/03/19 1439    Order Status:  Completed Specimen:  Blood Updated:  04/05/19 0727     Special Requests: --        RIGHT  HAND       Culture result: NO GROWTH 2 DAYS       CULTURE, BLOOD [[id number]] Collected:  04/02/19 1655    Order Status:  Completed Specimen:  Blood Updated:  04/05/19 0727     Special Requests: --        LEFT  Antecubital       Culture result: NO GROWTH 3 DAYS       CULTURE, BLOOD [[id number]] Collected:  04/02/19 1655    Order Status:  Canceled Specimen:  Blood           Assessment/Plan:     Principal Problem:  Urinary tract infection due to ESBL Klebsiella (04/03/2019)  - UA 8/8 with large leukocyte esterase and blood  - Urine culture 8/4 with >100,000 ESBL Klebsiella  - Continue Levaquin based on culture  - Repeat culture 8/8 with >100,000 ESBL Klebsiella    Active Problems:    Hypotension (04/02/2019)  - Resolved  - Last BP 115/71  - Continue IVFs      AKI (acute kidney injury) ([place]) (04/02/2019)  - Resolved  - Continue IVFs      Acute metabolic encephalopathy (4/0/1027)  - Unsure if progression of dementia vs UTI  - Talking more today so ?improving  - Urine culture 8/4 with >100,000 ESBL Klebsiella  - Continue Levaquin based on culture  - TSH normal  - Electrolytes unremarkable      Severe sepsis with acute organ dysfunction ([place]) (04/03/2019)  - In ER patient with HR 98 + RR 22 + probable UTI + encephalopathy  - HR now 88  - Continue Levaquin  - Urine culture 8/4 with >100,000 ESBL Klebsiella  - Repeat urine culture with >100,000 GNR  - CXR unremarkable  - Blood cultures NGTD  - Lactic acid now normal  - Procalcitonin 0.15      Lactic acidosis (04/03/2019)  - Resolved   - Lactic Acid 2.4 --> 1.5  - Continue IVFs      Frontotemporal dementia ([place]) (02/01/2015)  - Continue Aricept  - Continue Depakote  - Continue Trazodone      Asthma (10/27/2012)      GERD without esophagitis (10/27/2012)  - Continue Pepcid      Acquired hypothyroidism (08/29/2015)      Depression with anxiety (02/01/2015)      Hypoalbuminemia (04/03/2019)    Today's Plan: Continue Levaquin. Stop VFs since appetite improved.    DIET MECHANICAL SOFT  DIET NUTRITIONAL SUPPLEMENTS All Meals; Other ( )  DIET NUTRITIONAL SUPPLEMENTS All Meals; Ensure Enlive    DVT Prophylaxis: Lovenox    Discharge Plan: Medically stable for discharge to SNF when available      Signed By: Faythe Casa, DO     April 06, 2019

## 2019-04-06 NOTE — Progress Notes (Signed)
Patient resting in bed with eyes closed. Continue waiting on Cathy Jackson transport. Reported off to nightshift RN.

## 2019-04-06 NOTE — Progress Notes (Signed)
Problem: Mobility Impaired (Adult and Pediatric)  Goal: *Acute Goals and Plan of Care (Insert Text)  Outcome: Progressing Towards Goal  Note:     LTG:  (1.)Cathy Jackson will move from supine to sit and sit to supine, scoot up and down, and roll side to side with STAND BY ASSIST within 7 treatment day(s).    (2.)Cathy Jackson will transfer from bed to chair and chair to bed with CONTACT GUARD ASSIST using the least restrictive device within 7 treatment day(s).    (3.)Cathy Jackson will ambulate with MINIMAL ASSIST for 50+ feet with the least restrictive device within 7 treatment day(s).   (4.)Cathy Jackson will perform exercises per HEP for 10+ minutes to improve strength and mobility within 7 days.  ________________________________________________________________________________________________       PHYSICAL THERAPY: Daily Note and AM 04/06/2019  INPATIENT: PT Visit Days : 3  Payor: [employer]ET[employer] MEDICARE / Plan: [employer] AET[employer] MEDICARE ADVANTAGE / Product Type: Managed Care Medicare /       NAME/AGE/GENDER: Cathy Jackson is a 73 y.o. female   PRIMARY DIAGNOSIS: Altered mental status, unspecified [R41.82]  Hypotension [I95.9]  AKI (acute kidney injury) ([place]) [N17.9] Urinary tract infection due to ESBL Klebsiella Urinary tract infection due to ESBL Klebsiella       ICD-10: Treatment Diagnosis:    ?? Generalized Muscle Weakness (M62.81)  ?? Difficulty in walking, Not elsewhere classified (R26.2)  ?? Other abnormalities of gait and mobility (R26.89)   Precaution/Allergies:  Patient has no known allergies.      ASSESSMENT:     Cathy Jackson is a 73 year old female admitted from home with altered mental status/encephalopathy, AKI. Has history of frontotemporal dementia. She is very soft spoken and confused.  Unclear her home situation or prior level of mobility. Requires mod assist for bed mobility/transfer to sitting. Left trunk lean in sitting and needs mod assist for seated  mobility, positioning.  Head and neck are turned to the left and the patient grimaces when the head/neck is touched or turned.  Sit to stand with moderate assist several times and the patient did very well with standing, transfer to the recliner with moderate assist with the patient weight bearing on bilateral LE's.  Patient is not able to scoot herself back in the recliner and total assist is required for this.  Patient likes to sit with her hip/knees bent up rather high.  PCT is at bedside.  RN is aware of the patients location.  Chair alarm intact.   Better session today.  Making a little progress.  Cathy Jackson is curretly quite weak and debilitated and is limited by mental status. Unclear her baseline level of function. She will benefit from continued acute PT to address deficits/maximize independence with mobility. Anticipate need for rehab at dc.    This section established at most recent assessment   PROBLEM LIST (Impairments causing functional limitations):  1. Decreased Strength  2. Decreased ADL/Functional Activities  3. Decreased Transfer Abilities  4. Decreased Ambulation Ability/Technique  5. Decreased Balance  6. Decreased Activity Tolerance  7. Decreased Cognition   INTERVENTIONS PLANNED: (Benefits and precautions of physical therapy have been discussed with the patient.)  1. Balance Exercise  2. Bed Mobility  3. Gait Training  4. Home Exercise Program (HEP)  5. Therapeutic Activites  6. Therapeutic Exercise/Strengthening  7. Transfer Training     TREATMENT PLAN: Frequency/Duration: 3 times a week for duration of hospital stay  Rehabilitation Potential For Stated Goals: Good  REHAB RECOMMENDATIONS (at time of discharge pending progress):    Placement:  It is my opinion, based on this patient's performance to date, that Cathy Jackson may benefit from intensive therapy at a [place] [facility] after discharge due to the functional deficits listed above that are  likely to improve with skilled rehabilitation and concerns that he/she may be unsafe to be unsupervised at home due to weakness, need for heavy assist with all mobility, fall risk .  Equipment:   ? tbd              HISTORY:   History of Present Injury/Illness (Reason for Referral):  Per H&P, "Patient is a 73 y/o female with hx frontotemporal dementia, bladder cancer, s/p ileal conduit, s/p nephro-ureteral tube placement 03/29/19, hypothyroid, gerd, UTI who presents from home with worsening weakness, falling out of bed, worsening behavior, and now disoriented x 4. Patient cannot provide any meaningful history. Blood pressure initially low in ED 91/50. Responded to IV fluids. UA does not appear infected. Patient is afebrile. Has a lactic acid of 2.4. procalcitonin 0.15. WBC ct only 9.9. Bun 34/Cr 1.38. TSH 1.870. head CT is negative. Chest xray is negative. Hospitalist service consulted for admission for AKI and acute encephalopathy"    Past Medical History/Comorbidities:   Cathy Jackson  has a past medical history of Abdominal pain, unspecified site (10/27/2012), Arthritis, Asthma (10/27/2012), Bladder cancer ([place]), Depression, Dysuria (10/27/2012), Environmental allergies, GERD (gastroesophageal reflux disease) (10/27/2012), History of kidney stones, Hypothyroidism, Other "heavy-for-dates" infants, Unspecified disorder of bladder (10/27/2012), Urethral caruncle (10/27/2012), Urinary frequency (10/27/2012), and Urinary tract infection, site not specified (10/27/2012). She also has no past medical history of Difficult intubation, Malignant hyperthermia due to anesthesia, Nausea & vomiting, Pseudocholinesterase deficiency, or Unspecified adverse effect of anesthesia.  Cathy Jackson  has a past surgical history that includes hx breast biopsy (Left, 2000); hx cesarean section; hx tubal ligation; pr close cystostomy; hx wrist fracture tx (Left); hx cholecystectomy; hx nephrostomy (2014); ir exchange  nephro perc rt si (10/23/2017); ir convert nephro perc rt to nephroureteral cath existing si (11/12/2017); ir change internal ureteral stent si (02/15/2018); ir change internal ureteral stent si (04/15/2018); ir change internal ureteral stent si (06/14/2018); ir change uret tube via ileal conduit (09/07/2018); ir change uret tube via ileal conduit (12/20/2018); ir change uret tube via ileal conduit (03/21/2019); and ir nephroureteral perc rt plc cath new access  si (03/29/2019).  Social History/Living Environment:   Home Environment: Private residence  # Steps to Enter: 0(ramp)  One/Two Story Residence: Two story, live on 1st floor  Living Alone: No  Support Systems: Child(ren)  Patient Expects to be Discharged to:: Skilled nursing facility  Current DME Used/Available at Home: None  Prior Level of Function/Work/Activity:  Unknown- no information in chart and pt confused/disoriented today, unable to answer questions or provide history.     Number of Personal Factors/Comorbidities that affect the Plan of Care: 3+: HIGH COMPLEXITY   EXAMINATION:   Most Recent Physical Functioning:   Gross Assessment:                  Posture:     Balance:  Sitting: Impaired  Sitting - Static: Fair (occasional)  Sitting - Dynamic: Poor (constant support)  Standing: Impaired  Standing - Static: Poor  Standing - Dynamic : Poor Bed Mobility:  Rolling: Minimum assistance;Moderate assistance;Assist x1  Supine to Sit: Minimum assistance;Moderate assistance;Assist x1  Scooting: Minimum assistance;Moderate assistance;Assist x1  Wheelchair Mobility:  Transfers:  Sit to Stand: Moderate assistance;Assist x1  Stand to Sit: Moderate assistance;Assist x1  Gait:            Body Structures Involved:  1. Eyes and Ears  2. Muscles Body Functions Affected:  1. Mental  2. Neuromusculoskeletal  3. Movement Related Activities and Participation Affected:  1. General Tasks and Demands  2. Mobility  3. Domestic Life  4. Community, Social and [employer]    Number of elements that affect the Plan of Care: 4+: HIGH COMPLEXITY   CLINICAL PRESENTATION:   Presentation: Evolving clinical presentation with unstable and unpredictable characteristics: HIGH COMPLEXITY   CLINICAL DECISION MAKING:   [facility]	[place] AM-PAC??? ???6 Clicks???   Basic Mobility Inpatient Short Form  How much difficulty does the patient currently have... Unable A Lot A Little None   1.  Turning over in bed (including adjusting bedclothes, sheets and blankets)?   []  1   [x]  2   []  3   []  4   2.  Sitting down on and standing up from a chair with arms ( e.g., wheelchair, bedside commode, etc.)   []  1   [x]  2   []  3   []  4   3.  Moving from lying on back to sitting on the side of the bed?   []  1   [x]  2   []  3   []  4   How much help from another person does the patient currently need... Total A Lot A Little None   4.  Moving to and from a bed to a chair (including a wheelchair)?   []  1   [x]  2   []  3   []  4   5.  Need to walk in hospital room?   [x]  1   []  2   []  3   []  4   6.  Climbing 3-5 steps with a railing?   [x]  1   []  2   []  3   []  4   ?? 2007, [employer], under license to [employer]. All rights reserved      Score:  Initial: 10 Most Recent: X (Date: -- )    Interpretation of Tool:  Represents activities that are increasingly more difficult (i.e. Bed mobility, Transfers, Gait).    Medical Necessity:     ?? Patient demonstrates   ?? fair  ??  rehab potential due to higher previous functional level.  Reason for Services/Other Comments:  ?? Patient   ?? continues to demonstrate capacity to improve strength, mobility, balance, transfers, and activity tolerance which will   ?? increase independence, decrease amount of assistance required from caregiver, and increase safety  ?? .   Use of outcome tool(s) and clinical judgement create a POC that gives a: Difficult prediction of patient's progress: HIGH COMPLEXITY            TREATMENT:    (In addition to Assessment/Re-Assessment sessions the following treatments were rendered)   Pre-treatment Symptoms/Complaints:  Confused, very soft spoken  Pain: Initial: no number given however the patient has facial grimacing with head and neck movement  Pain Intensity 1: (no number given)  Post Session:  0/10 no number given     Therapeutic Activity: (   25 minutes ):  Therapeutic activities including Bed mobility (rolling, scooting and supine <-> sit transfers), seated static/dynamic balance and functional activities, sit-stand transfers , standing pre-gait and balance activities to improve mobility, strength, balance, coordination, and  activity tolerance .  Required minimal to moderate assist    to promote static and dynamic balance in standing and promote motor control of bilateral, lower extremity(s).     Braces/Orthotics/Lines/Etc:   ?? IV  ?? O2 Device: Room air  Treatment/Session Assessment:    ?? Response to Treatment:  Participates a little with transfer to the  recliner  ?? Interdisciplinary Collaboration:   o Physical Therapy Assistant  o Registered Nurse  ?? After treatment position/precautions:   o Supine in bed  o Bed alarm/tab alert on  o Bed/Chair-wheels locked  o Caregiver at bedside  o Call light within reach  o RN notified   ?? Compliance with Program/Exercises: Will assess as treatment progresses  ?? Recommendations/Intent for next treatment session:  "Next visit will focus on advancements to more challenging activities and reduction in assistance provided".  Total Treatment Duration:  PT Patient Time In/Time Out  Time In: 1000  Time Out: 1025    Tangie T Stoddard-[facility], PTA

## 2019-04-06 NOTE — Progress Notes (Signed)
Resting quietly in bed. No s/sx of distress. Respirations even and unlabored. Awaiting on [facility] transport for pick up to transfer to SNF

## 2019-04-06 NOTE — Progress Notes (Signed)
Pt medically ready for dc to [facility] today CM contacted daughter Danelle Berry about transport made aware transport set for 4pm today with [employer], agreeable to transport.  Pt will be going to room 327 A, phone [phone].    Care Management Interventions  PCP Verified by CM: Yes  Transition of Care Consult (CM Consult): SNF  Physical Therapy Consult: Yes  Occupational Therapy Consult: Yes  Current Support Network: (lives with daughter)  Confirm Follow Up Transport: Family  The Plan for Transition of Care is Related to the Following Treatment Goals : to increase mobility  The Patient and/or Patient Representative was Provided with a Choice of Provider and Agrees with the Discharge Plan?: Yes  Name of the Patient Representative Who was Provided with a Choice of Provider and Agrees with the Discharge Plan: pt  Freedom of Choice List was Provided with Basic Dialogue that Supports the Patient's Individualized Plan of Care/Goals, Treatment Preferences and Shares the Quality Data Associated with the Providers?: Yes  Discharge Location  Discharge Placement: Skilled nursing facility

## 2019-04-06 NOTE — Progress Notes (Signed)
END OF SHIFT NOTE:    INTAKE/OUTPUT  08/11 0701 - 08/12 0700  In: 4251.4 [I.V.:4251.4]  Out: 1900 [WCBJS:2831]  Voiding: YES  Catheter: UROSTOMY   Drain:   Nephrostomy Tube Flank (Active)   Site Assessment Clean, dry, & intact 04/05/19 2122   Dressing Status Clean, dry, & intact 04/05/19 2122   Drainage Description Other (Comment) 04/05/19 2122   Status Clamped 04/05/19 2122               Flatus: Patient does have flatus present.    Stool:  0 occurrences.    Characteristics:       Emesis: 0 occurrences.    Characteristics:        VITAL SIGNS  Patient Vitals for the past 12 hrs:   Temp Pulse Resp BP SpO2   04/06/19 0442 98.2 ??F (36.8 ??C) 80 16 110/71 95 %   04/05/19 2320 98.1 ??F (36.7 ??C) 89 16 130/76 93 %   04/05/19 2005 97.6 ??F (36.4 ??C) 80 16 106/63 96 %       Pain Assessment  Pain Intensity 1: 0 (04/05/19 1400)        Patient Stated Pain Goal: 0    Ambulating  No    Shift report given to oncoming nurse at the bedside.    Cherie Dark, RN

## 2019-04-07 ENCOUNTER — Inpatient Hospital Stay
Admit: 2019-04-07 | Discharge: 2019-04-08 | Disposition: A | Discharge status: Home / Self Care | Payer: MEDICARE | Attending: Emergency Medicine

## 2019-04-07 DIAGNOSIS — R509 Fever, unspecified: Secondary | ICD-10-CM

## 2019-04-07 LAB — METABOLIC PANEL, COMPREHENSIVE
A-G Ratio: 0.4 — ABNORMAL LOW (ref 1.2–3.5)
ALT (SGPT): 16 U/L (ref 12–65)
AST (SGOT): 25 U/L (ref 15–37)
Albumin: 1.8 g/dL — ABNORMAL LOW (ref 3.2–4.6)
Alk. phosphatase: 53 U/L (ref 50–136)
Anion gap: 7 mmol/L (ref 7–16)
BUN: 11 MG/DL (ref 8–23)
Bilirubin, total: 0.3 MG/DL (ref 0.2–1.1)
CO2: 30 mmol/L (ref 21–32)
Calcium: 8.5 MG/DL (ref 8.3–10.4)
Chloride: 103 mmol/L (ref 98–107)
Creatinine: 0.78 MG/DL (ref 0.6–1.0)
GFR est AA: >60 mL/min/{1.73_m2} (ref >60)
GFR est non-AA: >60 mL/min/{1.73_m2} (ref >60)
Globulin: 4.5 g/dL — ABNORMAL HIGH (ref 2.3–3.5)
Glucose: 86 mg/dL (ref 65–100)
Potassium: 2.8 mmol/L — CL (ref 3.5–5.1)
Protein, total: 6.3 g/dL (ref 6.3–8.2)
Sodium: 140 mmol/L (ref 136–145)

## 2019-04-07 LAB — CULTURE, BLOOD: Culture result:: NO GROWTH

## 2019-04-07 LAB — CBC WITH AUTOMATED DIFF
ABS. BASOPHILS: 0 10*3/uL (ref 0.0–0.2)
ABS. EOSINOPHILS: 0 10*3/uL (ref 0.0–0.8)
ABS. IMM. GRANS.: 0 10*3/uL (ref 0.0–0.5)
ABS. LYMPHOCYTES: 1.3 10*3/uL (ref 0.5–4.6)
ABS. MONOCYTES: 1.3 10*3/uL (ref 0.1–1.3)
ABS. NEUTROPHILS: 5.8 10*3/uL (ref 1.7–8.2)
ABSOLUTE NRBC: 0 10*3/uL (ref 0.0–0.2)
BASOPHILS: 0 % (ref 0.0–2.0)
EOSINOPHILS: 0 % — ABNORMAL LOW (ref 0.5–7.8)
HCT: 34.2 % — ABNORMAL LOW (ref 35.8–46.3)
HGB: 11 g/dL — ABNORMAL LOW (ref 11.7–15.4)
IMMATURE GRANULOCYTES: 1 % (ref 0.0–5.0)
LYMPHOCYTES: 15 % (ref 13–44)
MCH: 30.1 PG (ref 26.1–32.9)
MCHC: 32.2 g/dL (ref 31.4–35.0)
MCV: 93.4 FL (ref 79.6–97.8)
MONOCYTES: 15 % — ABNORMAL HIGH (ref 4.0–12.0)
MPV: 10.2 FL (ref 9.4–12.3)
NEUTROPHILS: 68 % (ref 43–78)
PLATELET: 173 10*3/uL (ref 150–450)
RBC: 3.66 M/uL — ABNORMAL LOW (ref 4.05–5.2)
RDW: 13.6 % (ref 11.9–14.6)
WBC: 8.5 10*3/uL (ref 4.3–11.1)

## 2019-04-07 LAB — SARS-COV-2
COVID-19 rapid test: NOT DETECTED
SARS CoV-2: NEGATIVE

## 2019-04-07 MED ORDER — POTASSIUM CHLORIDE SR 20 MEQ TAB, PARTICLES/CRYSTALS
20 mEq | ORAL | Status: AC
Start: 2019-04-07 — End: 2019-04-07
  Administered 2019-04-07: 23:00:00 via ORAL

## 2019-04-07 MED FILL — POTASSIUM CHLORIDE SR 20 MEQ TAB, PARTICLES/CRYSTALS: 20 mEq | ORAL | Qty: 1

## 2019-04-07 NOTE — Progress Notes (Signed)
Pt had appt today and cancelled. Just wanted to let you know.Ty

## 2019-04-07 NOTE — ED Notes (Signed)
Resting waiting for [facility]

## 2019-04-07 NOTE — Progress Notes (Signed)
Patient discharged to [facility] on 04/06/19. Patient discharged to a SNF [facility] facility.  Patient will be included in weekly care coordination calls. Information forwarded to Hedy Camara RN, SNF [facility] RN Care Manager.

## 2019-04-07 NOTE — ED Notes (Signed)
I have reviewed discharge instructions with the patient.  The patient verbalized understanding.    Patient left ED via Discharge Method: stretcher to SNF [facility]     Opportunity for questions and clarification provided.       Patient given 0 scripts.         To continue your aftercare when you leave the hospital, you may receive an automated call from our care team to check in on how you are doing.  This is a free service and part of our promise to provide the best care and service to meet your aftercare needs.??? If you have questions, or wish to unsubscribe from this service please call [phone].  Thank you for Choosing our [facility] Emergency Department.

## 2019-04-07 NOTE — ED Notes (Signed)
Report given to Debbie at [facility]

## 2019-04-07 NOTE — ED Provider Notes (Signed)
73 year old female presenting from rehab for evaluation for concern for sepsis.  Patient was just discharged yesterday for complicated urinary tract infection.  Patient sent back by [facility] today because they felt like she had a low-grade temperature she seemed more fatigued.  Patient herself has no complaints.  During her hospitalization where she was diagnosed with a Klebsiella pneumonia a urinary tract infection and discharged with Bactrim    The history is provided by the patient.   Fever    This is a recurrent problem. The problem has been resolved. Her temperature was unmeasured prior to arrival. The temperature was taken using an oral thermometer. Pertinent negatives include no chest pain, no fussiness, no sleepiness, no diarrhea, no vomiting, no congestion, no headaches, no sore throat, no muscle aches, no cough, no shortness of breath, no mental status change, no neck pain, no rash and no urinary symptoms. She has tried nothing for the symptoms. The treatment provided no relief.        Past Medical History:   Diagnosis Date   ??? Abdominal pain, unspecified site 10/27/2012   ??? Arthritis     generalized   ??? Asthma 10/27/2012   ??? Bladder cancer ([place])    ??? Depression     managed with medication    ??? Dysuria 10/27/2012   ??? Environmental allergies     managed with medication    ??? GERD (gastroesophageal reflux disease) 10/27/2012   ??? History of kidney stones     states surgical removed   ??? Hypothyroidism     managed with medication    ??? Other "heavy-for-dates" infants     ureterectomy and ileal conduit   ??? Unspecified disorder of bladder 10/27/2012   ??? Urethral caruncle 10/27/2012   ??? Urinary frequency 10/27/2012   ??? Urinary tract infection, site not specified 10/27/2012       Past Surgical History:   Procedure Laterality Date   ??? CLOSE CYSTOSTOMY      cystoscopy/ turbt x 2   ??? HX BREAST BIOPSY Left 2000    x 2   ??? HX CESAREAN SECTION      x 1   ??? HX CHOLECYSTECTOMY     ??? HX NEPHROSTOMY  2014   ??? HX TUBAL LIGATION      ??? HX WRIST FRACTURE TX Left    ??? IR CHANGE INTERNAL URETERAL STENT SI  02/15/2018   ??? IR CHANGE INTERNAL URETERAL STENT SI  04/15/2018   ??? IR CHANGE INTERNAL URETERAL STENT SI  06/14/2018   ??? IR CHANGE URET TUBE VIA ILEAL CONDUIT  09/07/2018   ??? IR CHANGE URET TUBE VIA ILEAL CONDUIT  12/20/2018   ??? IR CHANGE URET TUBE VIA ILEAL CONDUIT  03/21/2019   ??? IR CONVERT NEPHRO PERC RT TO NEPHROURETERAL CATH EXISTING SI  11/12/2017   ??? IR EXCHANGE NEPHRO PERC RT SI  10/23/2017   ??? IR NEPHROURETERAL PERC RT PLC CATH NEW ACCESS  SI  03/29/2019         Family History:   Problem Relation Age of Onset   ??? Stroke Mother    ??? Heart Disease Mother    ??? Cancer Father    ??? Asthma Father    ??? Lung Disease Father         COPD/smoker   ??? Cancer Brother    ??? Heart Disease Brother    ??? Alcohol abuse Brother        Social History  Socioeconomic History   ??? Marital status: WIDOWED     Spouse name: Not on file   ??? Number of children: Not on file   ??? Years of education: Not on file   ??? Highest education level: Not on file   Occupational History   ??? Not on file   Social Needs   ??? Financial resource strain: Not on file   ??? Food insecurity     Worry: Not on file     Inability: Not on file   ??? Transportation needs     Medical: Not on file     Non-medical: Not on file   Tobacco Use   ??? Smoking status: Former Smoker     Packs/day: 0.50     Years: 20.00     Pack years: 10.00   ??? Smokeless tobacco: Never Used   ??? Tobacco comment: stopped at age 67   Substance and Sexual Activity   ??? Alcohol use: No   ??? Drug use: No   ??? Sexual activity: Never   Lifestyle   ??? Physical activity     Days per week: Not on file     Minutes per session: Not on file   ??? Stress: Not on file   Relationships   ??? Social Product manager on phone: Not on file     Gets together: Not on file     Attends religious service: Not on file     Active member of club or organization: Not on file     Attends meetings of clubs or organizations: Not on file     Relationship status: Not on file    ??? Intimate partner violence     Fear of current or ex partner: Not on file     Emotionally abused: Not on file     Physically abused: Not on file     Forced sexual activity: Not on file   Other Topics Concern   ??? Not on file   Social History Narrative   ??? Not on file         ALLERGIES: Patient has no known allergies.    Review of Systems   Constitutional: Positive for fatigue and fever.   HENT: Negative for congestion and sore throat.    Respiratory: Negative for cough and shortness of breath.    Cardiovascular: Negative for chest pain.   Gastrointestinal: Negative for diarrhea and vomiting.   Musculoskeletal: Negative for neck pain.   Skin: Negative for rash.   Neurological: Negative for headaches.   All other systems reviewed and are negative.      Vitals:    04/07/19 1641   BP: 107/55   Pulse: 90   Resp: 19   Temp: 98.4 ??F (36.9 ??C)   SpO2: 95%            Physical Exam  Vitals signs and nursing note reviewed.   Constitutional:       Appearance: She is well-developed.   HENT:      Head: Normocephalic and atraumatic.   Eyes:      Conjunctiva/sclera: Conjunctivae normal.      Pupils: Pupils are equal, round, and reactive to light.   Neck:      Musculoskeletal: Neck supple.   Cardiovascular:      Rate and Rhythm: Normal rate and regular rhythm.   Pulmonary:      Effort: Pulmonary effort is normal.      Breath sounds: Normal  breath sounds.   Abdominal:      General: Bowel sounds are normal.      Palpations: Abdomen is soft.   Musculoskeletal: Normal range of motion.   Skin:     General: Skin is warm and dry.   Neurological:      Mental Status: She is alert and oriented to person, place, and time.          MDM  Number of Diagnoses or Management Options  Diagnosis management comments: 73 year old female sent from nursing home for evaluation for potential sepsis.  Patient has normal vital signs and no complaints upon arrival.  We will check basic labs to compare to  yesterday's blood work.  If no substantial difference patient will be discharged back to complete her course of Bactrim       Amount and/or Complexity of Data Reviewed  Clinical lab tests: ordered and reviewed (Results for orders placed or performed during the hospital encounter of 04/07/19  -CBC WITH AUTOMATED DIFF       Result                      Value             Ref Range           WBC                         8.5               4.3 - 11.1 K*       RBC                         3.66 (L)          4.05 - 5.2 M*       HGB                         11.0 (L)          11.7 - 15.4 *       HCT                         34.2 (L)          35.8 - 46.3 %       MCV                         93.4              79.6 - 97.8 *       MCH                         30.1              26.1 - 32.9 *       MCHC                        32.2              31.4 - 35.0 *       RDW                         13.6              11.9 -  14.6 %       PLATELET                    173               150 - 450 K/*       MPV                         10.2              9.4 - 12.3 FL       ABSOLUTE NRBC               0.00              0.0 - 0.2 K/*       DF                          AUTOMATED                             NEUTROPHILS                 68                43 - 78 %           LYMPHOCYTES                 15                13 - 44 %           MONOCYTES                   15 (H)            4.0 - 12.0 %        EOSINOPHILS                 0 (L)             0.5 - 7.8 %         BASOPHILS                   0                 0.0 - 2.0 %         IMMATURE GRANULOCYTES       1                 0.0 - 5.0 %         ABS. NEUTROPHILS            5.8               1.7 - 8.2 K/*       ABS. LYMPHOCYTES            1.3               0.5 - 4.6 K/*       ABS. MONOCYTES              1.3               0.1 - 1.3 K/*       ABS. EOSINOPHILS            0.0  0.0 - 0.8 K/*       ABS. BASOPHILS              0.0               0.0 - 0.2 K/*        ABS. IMM. GRANS.            0.0               0.0 - 0.5 K/*  -METABOLIC PANEL, COMPREHENSIVE       Result                      Value             Ref Range           Sodium                      140               136 - 145 mm*       Potassium                   2.8 (LL)          3.5 - 5.1 mm*       Chloride                    103               98 - 107 mmo*       CO2                         30                21 - 32 mmol*       Anion gap                   7                 7 - 16 mmol/L       Glucose                     86                65 - 100 mg/*       BUN                         11                8 - 23 MG/DL        Creatinine                  0.78              0.6 - 1.0 MG*       GFR est AA                  >60               >60 ml/min/1*       GFR est non-AA              >60               >60 ml/min/1*       Calcium  8.5               8.3 - 10.4 M*       Bilirubin, total            0.3               0.2 - 1.1 MG*       ALT (SGPT)                  16                12 - 65 U/L         AST (SGOT)                  25                15 - 37 U/L         Alk. phosphatase            53                50 - 136 U/L        Protein, total              6.3               6.3 - 8.2 g/*       Albumin                     1.8 (L)           3.2 - 4.6 g/*       Globulin                    4.5 (H)           2.3 - 3.5 g/*       A-G Ratio                   0.4 (L)           1.2 - 3.5      )  Tests in the radiology section of CPT??: ordered and reviewed (Ct Head Wo Cont    Result Date: 04/02/2019  CT HEAD WITHOUT CONTRAST. INDICATION: Altered mental status COMPARISON: None.  TECHNIQUE:   5 mm axial scans from the skull base to the vertex.  Our CT scanners use one or more of the following:  Automated exposure control, adjustment of the mA and or kV according to patient size, iterative reconstruction. FINDINGS:  No acute intraparenchymal hemorrhage  or abnormal extra-axial fluid collection.  The ventricles are normal size.  No midline shift or mass effect. Included portion of the paranasal sinuses and orbits grossly unremarkable.     IMPRESSION:  Negative for acute intracranial abnormality.     Xr Chest Port    Result Date: 04/02/2019  CHEST X-RAY, one view. HISTORY:  Altered mental status. TECHNIQUE:  AP upright portable view. COMPARISON: 29 Dec 2017 FINDINGS:   -The lungs: are clear. -The heart size: is normal. -The costophrenic angles: are sharp. -The pulmonary vasculature: is unremarkable. -Included portion of the upper abdomen: is unremarkable. -Bones: No gross bony lesions. -Other: None.     IMPRESSION:  Negative for acute change.     Ir Change Uret Tube Via Ileal Conduit    Result Date: 03/21/2019  Title: Right retrograde nephroureteral drain exchange. Indication: 68 -year-old female with history of ileal conduit and ureteral obstruction  presents for nephroureteral drain exchange.  Consent: Informed written and oral consent was obtained from the patient after explanation of benefits and risks of procedure (including, but not limited to: infection, hemorrhage, loss of access).  The Patient's questions were answered to their satisfaction.  The patient stated understanding and requested that we proceed.  Procedure:  Maximal sterile barrier technique (including:  cap, mask, sterile gown, sterile gloves, sterile sheet, hand hygiene, and chlorhexidene for cutaneous antisepsis) were used.  With the patient prone the skin around the retrograde nephroureteral drain  was prepped and draped in the standard fashion. Using fluoroscopic guidance, the indwelling right retrograde nephroureteral catheter was removed over a Bentson wire.  A new 12 French retrograde nephroureteral catheter (biliary type) was positioned through the ileoconduit with the retention loop in the right renal pelvis.  Contrast was administered confirming appropriate  position. An ostomy bag was placed over the ileal conduit with the catheter in place. Complications: None. Radiation Exposure Indices: Cumulative dose: 4 mGy Contrast: 8 mL Isovue 300 Medications: None. Findings: The old retrograde nephroureteral drain was occluded due to encrustation. Mild right hydronephrosis. New retrograde infrarenal drain in appropriate positioning..     Impression: Successful right retrograde nephroureteral drain exchange via an ileal conduit, 12 [place]. The old catheter was occluded with mild right hydronephrosis. Recommend return for change in 6 weeks given catheter encrustations.    Ir Nephroureteral Perc Rt Plc Cath New Access  Si    Result Date: 03/29/2019  PROCEDURE: Genitourinary catheter placement Procedural Personnel Attending physician(s): Vita Erm, M.D. Pre-procedure diagnosis: 73 year old woman with chronic right ureteral pelvic junction stenosis/occlusion, history of bladder cancer status post cystectomy with ileal conduit creation.  Chronic retrograde right nephroureteral drain inadvertently removed by the patient yesterday.  2 week history of decreased appetite, decreased oral intake, worsening cognition in a patient with underlying dementia.  Retrograde nephroureteral drain catheter most recently exchanged 03/21/2019. Post-procedure diagnosis: Same Indication: Urinary obstruction.  Placement of new anterograde nephroureteral drainage catheter replaced the disc lodged retrograde nephroureteral catheter. Catheter(s) placed because the previous catheter(s) became dislodged within 30 days of placement (QCDR): Yes, one catheter Additional clinical history: None Complications: No immediate complications.     IMPRESSION: Successful percutaneous access to the Right kidney with nephroureteral drain placement.  Plan:  One hour bedrest recovery and then discharge home.  The nephroureteral drain is capped.  A spare bag is sent  with the patient's daughter, her primary caregiver.  Return in about 2 months for drain maintenance.  If all goes well, anticipate drain exchange every 3 months.  Recommend no IV at the next visit. Recommend oral Valium and oxycodone..  _______________________________________________________________ PROCEDURE SUMMARY - Target organ: Unilateral native kidney - Image-guided placement of genitourinary catheter(s) - Additional procedure(s): None PROCEDURE DETAILS: Pre-procedure Consent:  Informed written and oral consent for the procedure was obtained after explanation of risks (including, but not limitted to: Infection, hemorrhage, kidney injury, lung injury, intestine injury), benefits and alternatives.  The patient's questions were answered to their satisfaction.  They stated understanding and requested that we proceed. Final verification:  A time-out identifying the patient and planned procedure was performed prior to the procedure.  Preparation:  Maximal sterile barrier technique (including:  cap, mask, sterile gown, sterile gloves, sterile sheet, hand hygiene, and cutaneous antisepsis) was used.  Anesthesia/sedation Level of anesthesia/sedation:  Moderate sedation (conscious sedation) Anesthesia/sedation administered by:  Independent trained observer under attending supervision with continuous monitoring of the patient?s level of consciousness and physiologic status Total  intra-service sedation time (minutes):  29 Right genitourinary catheter placement Local anesthesia was administered.  A needle was advanced into the dilated upper pole calyx under ultrasound guidance.  A small amount of contrast was administered in order to opacify the collecting system and proximal ureter.  A wire was passed down the ureter into the bladder, the tract was serially dilated, and a nephroureteral tube was placed.  Contrast injection was not performed due to suspected  infection. Genitourinary catheter placed:  10.2-French 24 cm long urethane cope nephroureterostomy catheter Findings:  Marked right hydronephrosis and chronic ureteral pelvic junction stenosis.  Debris fills the right collecting system and proximal ureter.  The distal pigtail was formed in the ileal conduit.  The proximal pigtail was formed in the renal pelvis. External catheter securement:  Non-absorbable suture and adhesive anchoring device Contrast Contrast agent:  IsoVue 300 Contrast volume (mL):  4 Radiation Dose Reference Air Kerma (Ka,r) :  4 mGy Dose Area Product/Kerma Area Product (DAP/KAP/PKA) :  45.24 cGy-cm2 Fluoroscopy Exposure Time :  1 minute 6 seconds Fluorographic Images :  4 Additional Details Additional description of procedure:  None Equipment details:  None Specimens removed:  None. A sample was sent for analysis. Estimated blood loss (mL):  Less than 10 Standardized report: [email or web address] Attestation Signer name:  Vita Erm, M.D. I attest that I performed the entire procedure. I reviewed the stored images and agree with the report as written.     )  Independent visualization of images, tracings, or specimens: yes    Risk of Complications, Morbidity, and/or Mortality  Presenting problems: high  Diagnostic procedures: high  Management options: moderate  General comments: I personally reviewed the patient's vital signs, laboratory tests, and/or radiological findings.  I discussed these findings with the patient and their significance.  I answered all questions and gave the patient clear return precautions.  The patient was discharged from the emergency department in stable condition        Patient Progress  Patient progress: improved    ED Course as of Apr 12 1002   Thu Apr 07, 2019   1720 Significant delay in the patient's metabolic panel    [JS]   5462 CBC is normal.  Potassium slightly low but renal function is normal.    [JS]   1829 Vital signs are normal.    [JS]    1830 We will not replete the patient's potassium because she is on Bactrim    [JS]      ED Course User Index  [JS] Synetta Fail, MD       Procedures

## 2019-04-07 NOTE — ED Triage Notes (Signed)
Pt arrives with ems from [facility] mauldin w mask on. PER ems pt was discharged from this facility last night with dx of UTI. Pt discharged with bactrim. EMS called out for "possible sepsis". Per EMS pt did not meet any sepsis criteria,   Pt hx : dementia, anxiety,       EMS VS: 99.5 ax. HR 88-90 O2 97% RA. RR 22. BP 112/64.

## 2019-04-07 NOTE — ED Notes (Signed)
[facility] here for pickup

## 2019-04-07 NOTE — ED Notes (Signed)
Pt resting. NAD noted. waiting for [facility] ems

## 2019-04-07 NOTE — Progress Notes (Signed)
She has an appt with [place] on 04/13/2019. Thank you.

## 2019-04-08 LAB — CULTURE, BLOOD: Culture result:: NO GROWTH

## 2019-04-08 NOTE — Progress Notes (Signed)
RNCM receive via ED TOC assignment, pt returned to SNF at dc. No TOC indicated at this time.

## 2019-04-13 ENCOUNTER — Encounter: Attending: Community Health | Primary: Community Health

## 2019-04-15 ENCOUNTER — Other Ambulatory Visit: Payer: Self-pay | Admitting: Family Medicine

## 2019-04-20 ENCOUNTER — Other Ambulatory Visit: Payer: Self-pay | Admitting: Family Medicine

## 2019-04-20 NOTE — Progress Notes (Signed)
Community Care Team documentation for patient in [place]    The information below provided by:[facility] [place]    PT Update: MOD A TRANSFERS AND BED MOBILITY - MOD A SITTING AND STANDING BALANCE - MOD TO [place] WITH ABT CHANED TO [place], [place] ON 8/21      Discharge Date:Home with daughter, given Medicaid application [phone]      Assign to [place]

## 2019-04-27 NOTE — Progress Notes (Signed)
Community Care Team documentation for patient in [place]    The information below provided by:[facility] [place]    PT Update: discharged        Nursing Update:      Discharge Date: 04/26/19 LTC at [facility] Manager:n/a

## 2019-05-03 ENCOUNTER — Ambulatory Visit: Payer: MEDICARE | Primary: Community Health

## 2019-05-09 ENCOUNTER — Emergency Department: Admit: 2019-05-09 | Payer: MEDICARE | Primary: Community Health

## 2019-05-09 ENCOUNTER — Inpatient Hospital Stay: Admit: 2019-05-09 | Payer: MEDICARE | Attending: Emergency Medicine | Primary: Community Health

## 2019-05-09 ENCOUNTER — Inpatient Hospital Stay
Admit: 2019-05-09 | Discharge: 2019-05-16 | Disposition: A | Discharge status: Skilled Nursing Facility | Payer: MEDICARE | Attending: Family Medicine | Admitting: Family Medicine

## 2019-05-09 DIAGNOSIS — S72012A Unspecified intracapsular fracture of left femur, initial encounter for closed fracture: Secondary | ICD-10-CM

## 2019-05-09 LAB — CBC WITH AUTOMATED DIFF
ABS. BASOPHILS: 0 10*3/uL (ref 0.0–0.2)
ABS. EOSINOPHILS: 0 10*3/uL (ref 0.0–0.8)
ABS. IMM. GRANS.: 0 10*3/uL (ref 0.0–0.5)
ABS. LYMPHOCYTES: 0.9 10*3/uL (ref 0.5–4.6)
ABS. MONOCYTES: 1.5 10*3/uL — ABNORMAL HIGH (ref 0.1–1.3)
ABS. NEUTROPHILS: 7.8 10*3/uL (ref 1.7–8.2)
ABSOLUTE NRBC: 0 10*3/uL (ref 0.0–0.2)
BASOPHILS: 0 % (ref 0.0–2.0)
EOSINOPHILS: 0 % — ABNORMAL LOW (ref 0.5–7.8)
HCT: 38.2 % (ref 35.8–46.3)
HGB: 12.1 g/dL (ref 11.7–15.4)
IMMATURE GRANULOCYTES: 0 % (ref 0.0–5.0)
LYMPHOCYTES: 8 % — ABNORMAL LOW (ref 13–44)
MCH: 30.7 PG (ref 26.1–32.9)
MCHC: 31.7 g/dL (ref 31.4–35.0)
MCV: 97 FL (ref 79.6–97.8)
MONOCYTES: 15 % — ABNORMAL HIGH (ref 4.0–12.0)
MPV: 10.6 FL (ref 9.4–12.3)
NEUTROPHILS: 76 % (ref 43–78)
PLATELET: 197 10*3/uL (ref 150–450)
RBC: 3.94 M/uL — ABNORMAL LOW (ref 4.05–5.2)
RDW: 13.4 % (ref 11.9–14.6)
WBC: 10.2 10*3/uL (ref 4.3–11.1)

## 2019-05-09 LAB — METABOLIC PANEL, COMPREHENSIVE
A-G Ratio: 0.6 — ABNORMAL LOW (ref 1.2–3.5)
ALT (SGPT): 17 U/L (ref 12–65)
AST (SGOT): 16 U/L (ref 15–37)
Albumin: 2.7 g/dL — ABNORMAL LOW (ref 3.2–4.6)
Alk. phosphatase: 60 U/L (ref 50–136)
Anion gap: 7 mmol/L (ref 7–16)
BUN: 29 MG/DL — ABNORMAL HIGH (ref 8–23)
Bilirubin, total: 0.4 MG/DL (ref 0.2–1.1)
CO2: 28 mmol/L (ref 21–32)
Calcium: 9.1 MG/DL (ref 8.3–10.4)
Chloride: 103 mmol/L (ref 98–107)
Creatinine: 0.88 MG/DL (ref 0.6–1.0)
GFR est AA: >60 mL/min/{1.73_m2} (ref >60)
GFR est non-AA: >60 mL/min/{1.73_m2} (ref >60)
Globulin: 4.5 g/dL — ABNORMAL HIGH (ref 2.3–3.5)
Glucose: 135 mg/dL — ABNORMAL HIGH (ref 65–100)
Potassium: 4 mmol/L (ref 3.5–5.1)
Protein, total: 7.2 g/dL (ref 6.3–8.2)
Sodium: 138 mmol/L (ref 136–145)

## 2019-05-09 LAB — LACTIC ACID: Lactic acid: 1.9 MMOL/L (ref 0.4–2.0)

## 2019-05-09 MED ORDER — LACTATED RINGERS IV
Freq: Once | INTRAVENOUS | Status: AC
Start: 2019-05-09 — End: 2019-05-09
  Administered 2019-05-09: 23:00:00 via INTRAVENOUS

## 2019-05-09 MED ORDER — HYDROMORPHONE (PF) 1 MG/ML IJ SOLN
1 mg/mL | INTRAMUSCULAR | Status: AC
Start: 2019-05-09 — End: 2019-05-09
  Administered 2019-05-09: 23:00:00 via INTRAVENOUS

## 2019-05-09 MED ORDER — ONDANSETRON (PF) 4 MG/2 ML INJECTION
4 mg/2 mL | INTRAMUSCULAR | Status: AC
Start: 2019-05-09 — End: 2019-05-09
  Administered 2019-05-09: 23:00:00 via INTRAVENOUS

## 2019-05-09 MED FILL — HYDROMORPHONE (PF) 1 MG/ML IJ SOLN: 1 mg/mL | INTRAMUSCULAR | Qty: 1

## 2019-05-09 MED FILL — ONDANSETRON (PF) 4 MG/2 ML INJECTION: 4 mg/2 mL | INTRAMUSCULAR | Qty: 2

## 2019-05-09 NOTE — H&P (Addendum)
Hospitalist H&P/Consult Note     Admit Date:  05/09/2019  5:26 PM   Name:  Cathy Jackson   Age:  73 y.o.  DOB:  1946-08-11   MRN:  [id number]   PCP:  Wilfred Lacy, NP  Treatment Team: Attending Provider: [place] Campbell, MD; Primary Nurse: Pershing Cox    HPI:   Patient is a 73 y/o female with severe frontotemporal dementia, debility, bladder cancer, s/p ileal conduit, s/p nephro-ureteral tube placement 03/29/19, hypothyroid, GERD who suffered a fall today at a rehab facility. She was recently admitted 8/8-8/12 for ESBL Klebsiella UTI. Patient tried to stand but she could not. There was concern that she could have broken her hip. Xray does not show definite displaced fracture, but MRI reveals subcapital fracture of the left femoral neck. Bilateral edema like signal in the pubis bones may be a stress reaction or osteitis pubis. She also has a low grade temp of 100.2 without elevated WBC ct. Chest xray is clear. Her urine in bag is dark, concentrated and cloudy with sediment. UA with culture is pending. She will be admitted for further workup and treatment.     10 systems reviewed and negative except as noted in HPI. She is non verbal  Past Medical History:   Diagnosis Date   ??? Abdominal pain, unspecified site 10/27/2012   ??? Arthritis     generalized   ??? Asthma 10/27/2012   ??? Bladder cancer ([place])    ??? Depression     managed with medication    ??? Dysuria 10/27/2012   ??? Environmental allergies     managed with medication    ??? GERD (gastroesophageal reflux disease) 10/27/2012   ??? History of kidney stones     states surgical removed   ??? Hypothyroidism     managed with medication    ??? Other "heavy-for-dates" infants     ureterectomy and ileal conduit   ??? Unspecified disorder of bladder 10/27/2012   ??? Urethral caruncle 10/27/2012   ??? Urinary frequency 10/27/2012   ??? Urinary tract infection, site not specified 10/27/2012      Past Surgical History:   Procedure Laterality Date   ??? CLOSE CYSTOSTOMY      cystoscopy/ turbt x 2    ??? HX BREAST BIOPSY Left 2000    x 2   ??? HX CESAREAN SECTION      x 1   ??? HX CHOLECYSTECTOMY     ??? HX NEPHROSTOMY  2014   ??? HX TUBAL LIGATION     ??? HX WRIST FRACTURE TX Left    ??? IR CHANGE INTERNAL URETERAL STENT SI  02/15/2018   ??? IR CHANGE INTERNAL URETERAL STENT SI  04/15/2018   ??? IR CHANGE INTERNAL URETERAL STENT SI  06/14/2018   ??? IR CHANGE URET TUBE VIA ILEAL CONDUIT  09/07/2018   ??? IR CHANGE URET TUBE VIA ILEAL CONDUIT  12/20/2018   ??? IR CHANGE URET TUBE VIA ILEAL CONDUIT  03/21/2019   ??? IR CONVERT NEPHRO PERC RT TO NEPHROURETERAL CATH EXISTING SI  11/12/2017   ??? IR EXCHANGE NEPHRO PERC RT SI  10/23/2017   ??? IR NEPHROURETERAL PERC RT PLC CATH NEW ACCESS  SI  03/29/2019      Prior to Admission Medications   Prescriptions Last Dose Informant Patient Reported? Taking?   PARoxetine (PAXIL) 20 mg tablet   No No   Sig: Take 1 Tab by mouth daily.   ascorbic acid, vitamin C, (VITAMIN  C) 1,000 mg tablet   Yes No   Sig: Take  by mouth.   calcium carb-vitamin D3-vit K2 600 mg-1,000 unit-90 mcg tab   Yes No   Sig: Take  by mouth.   calcium carbonate (OS-CAL) 500 mg calcium (1,250 mg) tablet   Yes No   Sig: Take  by mouth daily.   ferrous sulfate (IRON) 325 mg (65 mg iron) tablet   Yes No   Sig: Take  by mouth Daily (before breakfast).   food supplemt, lactose-reduced (ENSURE ACTIVE CLEAR) liqd   No No   Sig: Take 237 mL by mouth four (4) times daily.   senna-docusate (PERICOLACE) 8.6-50 mg per tablet   Yes No   Sig: Take  by mouth.   simvastatin (ZOCOR) 20 mg tablet   No No   Sig: TAKE 1 TABLET BY MOUTH EVERY DAY AT NIGHT   traZODone (DESYREL) 50 mg tablet   No No   Sig: Take 1 Tab by mouth nightly.      Facility-Administered Medications: None     No Known Allergies   Social History     Tobacco Use   ??? Smoking status: Former Smoker     Packs/day: 0.50     Years: 20.00     Pack years: 10.00   ??? Smokeless tobacco: Never Used   ??? Tobacco comment: stopped at age 41   Substance Use Topics   ??? Alcohol use: No      Family History    Problem Relation Age of Onset   ??? Stroke Mother    ??? Heart Disease Mother    ??? Cancer Father    ??? Asthma Father    ??? Lung Disease Father         COPD/smoker   ??? Cancer Brother    ??? Heart Disease Brother    ??? Alcohol abuse Brother       Immunization History   Administered Date(s) Administered   ??? Influenza Vaccine 02/25/2011   ??? TB Skin Test (PPD) Intradermal 04/02/2019       Objective:     Patient Vitals for the past 24 hrs:   Temp Pulse Resp BP SpO2   05/09/19 2345 ??? 83 16 (!) 124/59 95 %   05/09/19 2313 ??? 87 ??? ??? 97 %   05/09/19 2245 ??? 86 ??? 122/61 (!) 88 %   05/09/19 2229 ??? 89 ??? (!) 107/58 93 %   05/09/19 2214 ??? 87 ??? (!) 108/58 98 %   05/09/19 1754 ??? (!) 107 ??? ??? 92 %   05/09/19 1732 ??? (!) 104 ??? 98/64 92 %   05/09/19 1727 100.2 ??F (37.9 ??C) (!) 110 16 98/64 92 %     Oxygen Therapy  O2 Sat (%): 95 % (05/09/19 2345)  Pulse via Oximetry: 83 beats per minute (05/09/19 2345)  O2 Device: Nasal cannula (05/09/19 2313)  O2 Flow Rate (L/min): 2 l/min (05/09/19 2313)    Intake/Output Summary (Last 24 hours) at 05/09/2019 2357  Last data filed at 05/09/2019 2245  Gross per 24 hour   Intake 200 ml   Output ???   Net 200 ml       Physical Exam:  General:    Cachectic, confused, edentulous  Eyes:   Normal sclera.  Extraocular movements intact.  ENT:  Normocephalic, atraumatic.dry mucous membranes  CV:   Tachycardic.  No murmur, rub, or gallop.    Lungs:  Clear to auscultation bilaterally.  No wheezing,  rhonchi, or rales.  Abdomen: Soft, ileal conduit draining. R nephrostomy tube  Dark, concentrated urine in bag with sediment  Extremities: Warm and dry.  No cyanosis or edema.  Tender over left hip prominence  Neurologic: CN II-XII grossly intact.  Sensation intact.  Skin:     No rashes or jaundice.  No wounds.    Psych:  Dementia and disoriented x 4    I reviewed the labs, imaging, EKGs, telemetry, and other studies done this admission.  Data Review:   Recent Results (from the past 24 hour(s))   CBC WITH AUTOMATED DIFF     Collection Time: 05/09/19  5:41 PM   Result Value Ref Range    WBC 10.2 4.3 - 11.1 K/uL    RBC 3.94 (L) 4.05 - 5.2 M/uL    HGB 12.1 11.7 - 15.4 g/dL    HCT 38.2 35.8 - 46.3 %    MCV 97.0 79.6 - 97.8 FL    MCH 30.7 26.1 - 32.9 PG    MCHC 31.7 31.4 - 35.0 g/dL    RDW 13.4 11.9 - 14.6 %    PLATELET 197 150 - 450 K/uL    MPV 10.6 9.4 - 12.3 FL    ABSOLUTE NRBC 0.00 0.0 - 0.2 K/uL    DF AUTOMATED      NEUTROPHILS 76 43 - 78 %    LYMPHOCYTES 8 (L) 13 - 44 %    MONOCYTES 15 (H) 4.0 - 12.0 %    EOSINOPHILS 0 (L) 0.5 - 7.8 %    BASOPHILS 0 0.0 - 2.0 %    IMMATURE GRANULOCYTES 0 0.0 - 5.0 %    ABS. NEUTROPHILS 7.8 1.7 - 8.2 K/UL    ABS. LYMPHOCYTES 0.9 0.5 - 4.6 K/UL    ABS. MONOCYTES 1.5 (H) 0.1 - 1.3 K/UL    ABS. EOSINOPHILS 0.0 0.0 - 0.8 K/UL    ABS. BASOPHILS 0.0 0.0 - 0.2 K/UL    ABS. IMM. GRANS. 0.0 0.0 - 0.5 K/UL   METABOLIC PANEL, COMPREHENSIVE    Collection Time: 05/09/19  5:41 PM   Result Value Ref Range    Sodium 138 136 - 145 mmol/L    Potassium 4.0 3.5 - 5.1 mmol/L    Chloride 103 98 - 107 mmol/L    CO2 28 21 - 32 mmol/L    Anion gap 7 7 - 16 mmol/L    Glucose 135 (H) 65 - 100 mg/dL    BUN 29 (H) 8 - 23 MG/DL    Creatinine 0.88 0.6 - 1.0 MG/DL    GFR est AA >60 >60 ml/min/1.37m    GFR est non-AA >60 >60 ml/min/1.774m   Calcium 9.1 8.3 - 10.4 MG/DL    Bilirubin, total 0.4 0.2 - 1.1 MG/DL    ALT (SGPT) 17 12 - 65 U/L    AST (SGOT) 16 15 - 37 U/L    Alk. phosphatase 60 50 - 136 U/L    Protein, total 7.2 6.3 - 8.2 g/dL    Albumin 2.7 (L) 3.2 - 4.6 g/dL    Globulin 4.5 (H) 2.3 - 3.5 g/dL    A-G Ratio 0.6 (L) 1.2 - 3.5     TYPE & SCREEN    Collection Time: 05/09/19  5:41 PM   Result Value Ref Range    Crossmatch Expiration 05/12/2019     ABO/Rh(D) A POSITIVE     Antibody screen NEG    LACTIC ACID    Collection Time:  05/09/19  5:41 PM   Result Value Ref Range    Lactic acid 1.9 0.4 - 2.0 MMOL/L   EKG, 12 LEAD, INITIAL    Collection Time: 05/09/19  6:32 PM   Result Value Ref Range    Ventricular Rate 106 BPM     Atrial Rate 106 BPM    P-R Interval 148 ms    QRS Duration 62 ms    Q-T Interval 304 ms    QTC Calculation (Bezet) 403 ms    Calculated P Axis 80 degrees    Calculated R Axis 73 degrees    Calculated T Axis 90 degrees    Diagnosis       !! AGE AND GENDER SPECIFIC ECG ANALYSIS !!  Sinus tachycardia  Right atrial enlargement  Borderline ECG  When compared with ECG of 29-Oct-2017 23:35,  No significant change was found     SARS-COV-2    Collection Time: 05/09/19 10:25 PM   Result Value Ref Range    Specimen source Nasopharyngeal      COVID-19 rapid test Not detected NOTD      SARS CoV-2 PENDING        Imaging Studies:  CXR Results  (Last 48 hours)               05/09/19 1829  XR CHEST SNGL V Final result    Impression:  IMPRESSION:       No evidence of acute cardiopulmonary disease.       VOICE DICTATED BY: Dr. Orlean Patten       Narrative:  EXAMINATION: CHEST RADIOGRAPH 05/09/2019 6:29 PM       ACCESSION NUMBER: [id number]       INDICATION: Fever, fractured hip       COMPARISON: Chest x-ray 04/02/2019, 10/29/2017, chest CT 11/09/2015       TECHNIQUE: A single AP view of the chest was obtained.        FINDINGS:        Support Lines and Tubes: None       Cardiac Silhouette: Within normal limits in size.       Lungs: No focal airspace disease.       Pleura: No pleural effusion. No pneumothorax.       Osseous Structures: Thoracic spine spondylosis.       Upper Abdomen: Unremarkable.                   CT Results  (Last 48 hours)    None          Assessment and Plan:     Hospital Problems as of 05/09/2019 Date Reviewed: 06/19/18          Codes Class Noted - Resolved POA    * (Principal) Closed left hip fracture ([place]) ICD-10-CM: Z56.387F  ICD-9-CM: 820.8  05/09/2019 - Present Yes        Low grade fever ICD-10-CM: R50.9  ICD-9-CM: 780.60  05/09/2019 - Present Yes        Frontotemporal dementia ([place]) ICD-10-CM: G31.09, F02.80  ICD-9-CM: 331.19  02/01/2015 - Present Yes    Overview Signed 10/20/2017 10:25 AM by Wilfred Lacy, NP      Last Assessment & Plan:   Disinhibited, agitated, aggressive, violent at times. Will add depakote to try to calm her behaviors down. Might need to adjust namenda, aricept, and paxil, too but will start with depakote. Provided information about the drug, anticipated effects, and possible side effects. Family will call me  next week and let me know how she is doing, we may need to increase the dose, but since she is small, we will need to monitor for fall risk.     Daughter inquired about PACE program. She isnt medicaid eligible, I dont think, but out of pocket might be an option for daytime care. She is also going to look into senior day programs. I will refer her daughter to the [employer] for additional resources. Might need to send daughter REACH at next appt, too.              Dehydration ICD-10-CM: E86.0  ICD-9-CM: 276.51  05/09/2019 - Present Yes        Acquired hypothyroidism ICD-10-CM: E03.9  ICD-9-CM: 244.9  08/29/2015 - Present Yes        Essential hypertension ICD-10-CM: I10  ICD-9-CM: 401.9  08/29/2015 - Present Yes        Depression with anxiety ICD-10-CM: F41.8  ICD-9-CM: 300.4  02/01/2015 - Present Yes    Overview Signed 10/20/2017 10:25 AM by Wilfred Lacy, NP     Last Assessment & Plan:   History of depression, anxiety, and agitation. Currently taking Celexa 20 mg- for about 3-4 years. Tolerating well and denies ADEs. Plan: Continue Celexa 20 mg daily.              Asthma ICD-10-CM: J45.909  ICD-9-CM: 493.90  10/27/2012 - Present Yes        GERD without esophagitis ICD-10-CM: K21.9  ICD-9-CM: 530.81  10/27/2012 - Present Yes        Bladder cancer ([place]) ICD-10-CM: C67.9  ICD-9-CM: 188.9  09/20/2012 - Present Yes                PLAN:  ?? Admit to ortho unit  ?? Hip fracture order set utilized  ?? Pain control  ?? Bedrest  ?? Hold antiplatelets/AC  ?? Ortho consulted  ?? Continue other home meds as ordered  ?? Patient has hx severe frontotemporal dementia. Will be at high risk for  acute delirium. She has hx of ESBL Klebsiella a month ago. Need repeat UA and culture to see if this has cleared or has recurred with her low grade fever.     FEN:  NPO prior to surgery  DVT ppx:  SCDs until after surgery  Estimated LOS:  3 days  Anticipated DC needs:  Ppd ordered.  CM consulted.  PT/OT evals after surgery  Risk:  High    Addendum: 417 am. UA is again positive for UTI. With hx ESBL klebsiella recently will place on IV meropenem based on previous susceptibility panel    Signed:  [place] Campbell, MD

## 2019-05-09 NOTE — ED Notes (Signed)
Report given to Allison, RN

## 2019-05-09 NOTE — ED Triage Notes (Signed)
Pt coming from [facility] via EMS. Pt fell this morning at 11am and fractured left hip per nursing facility but states pt is "fine", unsure if facility did Xrays or assumed hip was broken. Pt having pain to touch. AMS related to advanced dementia. Urostomy and nephrostomy in place due to chronic UTI's.

## 2019-05-09 NOTE — ED Provider Notes (Addendum)
Patient may have some dementia and cannot give much history at all.  Patient's daughter gives most of the history.  Patient has been to rehab approximately 3 weeks after being discharged from hospital for urinary tract infection.  She suffered a fall today.  Evidently a film was done and the patient is suspected having left hip fracture.  Patient's daughter states she is indicated no other areas of pain.  Other medical problems include hypothyroidism.  Also history of bladder cancer.  She does have a history of frequent urinary tract infections.  She has a ureterostomy and a urostomy.  Patient tried to stand to walk was unable according to the daughter.    The history is provided by the patient and a relative.   Hip Injury    This is a new problem. The current episode started 3 to 5 hours ago. The problem has not changed since onset.The pain is present in the left hip. The pain is moderate. Associated symptoms include limited range of motion. There has been a history of trauma.        Past Medical History:   Diagnosis Date   ??? Abdominal pain, unspecified site 10/27/2012   ??? Arthritis     generalized   ??? Asthma 10/27/2012   ??? Bladder cancer ([place])    ??? Depression     managed with medication    ??? Dysuria 10/27/2012   ??? Environmental allergies     managed with medication    ??? GERD (gastroesophageal reflux disease) 10/27/2012   ??? History of kidney stones     states surgical removed   ??? Hypothyroidism     managed with medication    ??? Other "heavy-for-dates" infants     ureterectomy and ileal conduit   ??? Unspecified disorder of bladder 10/27/2012   ??? Urethral caruncle 10/27/2012   ??? Urinary frequency 10/27/2012   ??? Urinary tract infection, site not specified 10/27/2012       Past Surgical History:   Procedure Laterality Date   ??? CLOSE CYSTOSTOMY      cystoscopy/ turbt x 2   ??? HX BREAST BIOPSY Left 2000    x 2   ??? HX CESAREAN SECTION      x 1   ??? HX CHOLECYSTECTOMY     ??? HX NEPHROSTOMY  2014   ??? HX TUBAL LIGATION      ??? HX WRIST FRACTURE TX Left    ??? IR CHANGE INTERNAL URETERAL STENT SI  02/15/2018   ??? IR CHANGE INTERNAL URETERAL STENT SI  04/15/2018   ??? IR CHANGE INTERNAL URETERAL STENT SI  06/14/2018   ??? IR CHANGE URET TUBE VIA ILEAL CONDUIT  09/07/2018   ??? IR CHANGE URET TUBE VIA ILEAL CONDUIT  12/20/2018   ??? IR CHANGE URET TUBE VIA ILEAL CONDUIT  03/21/2019   ??? IR CONVERT NEPHRO PERC RT TO NEPHROURETERAL CATH EXISTING SI  11/12/2017   ??? IR EXCHANGE NEPHRO PERC RT SI  10/23/2017   ??? IR NEPHROURETERAL PERC RT PLC CATH NEW ACCESS  SI  03/29/2019         Family History:   Problem Relation Age of Onset   ??? Stroke Mother    ??? Heart Disease Mother    ??? Cancer Father    ??? Asthma Father    ??? Lung Disease Father         COPD/smoker   ??? Cancer Brother    ??? Heart Disease Brother    ???  Alcohol abuse Brother        Social History     Socioeconomic History   ??? Marital status: WIDOWED     Spouse name: Not on file   ??? Number of children: Not on file   ??? Years of education: Not on file   ??? Highest education level: Not on file   Occupational History   ??? Not on file   Social Needs   ??? Financial resource strain: Not on file   ??? Food insecurity     Worry: Not on file     Inability: Not on file   ??? Transportation needs     Medical: Not on file     Non-medical: Not on file   Tobacco Use   ??? Smoking status: Former Smoker     Packs/day: 0.50     Years: 20.00     Pack years: 10.00   ??? Smokeless tobacco: Never Used   ??? Tobacco comment: stopped at age 30   Substance and Sexual Activity   ??? Alcohol use: No   ??? Drug use: No   ??? Sexual activity: Never   Lifestyle   ??? Physical activity     Days per week: Not on file     Minutes per session: Not on file   ??? Stress: Not on file   Relationships   ??? Social Product manager on phone: Not on file     Gets together: Not on file     Attends religious service: Not on file     Active member of club or organization: Not on file     Attends meetings of clubs or organizations: Not on file     Relationship status: Not on file    ??? Intimate partner violence     Fear of current or ex partner: Not on file     Emotionally abused: Not on file     Physically abused: Not on file     Forced sexual activity: Not on file   Other Topics Concern   ??? Not on file   Social History Narrative   ??? Not on file         ALLERGIES: Patient has no known allergies.    Review of Systems   Unable to perform ROS: Dementia       Vitals:    05/09/19 1727   BP: 98/64   Pulse: (!) 110   Resp: 16   Temp: 100.2 ??F (37.9 ??C)   SpO2: 92%   Weight: 49.9 kg (110 lb)   Height: '5\' 2"'  (1.575 m)            Physical Exam  Vitals signs and nursing note reviewed.   Constitutional:       General: She is not in acute distress.     Appearance: She is well-developed.   HENT:      Head: Normocephalic and atraumatic.      Right Ear: External ear normal.      Left Ear: External ear normal.      Mouth/Throat:      Pharynx: No oropharyngeal exudate.   Eyes:      Conjunctiva/sclera: Conjunctivae normal.      Pupils: Pupils are equal, round, and reactive to light.   Neck:      Musculoskeletal: Normal range of motion and neck supple.   Cardiovascular:      Rate and Rhythm: Normal rate and regular rhythm.  Heart sounds: No murmur.   Pulmonary:      Effort: No respiratory distress.      Breath sounds: Normal breath sounds.   Abdominal:      General: Bowel sounds are normal.      Palpations: Abdomen is soft. There is no mass.      Tenderness: There is no abdominal tenderness. There is no guarding or rebound.      Hernia: No hernia is present.   Musculoskeletal:      Left hip: She exhibits tenderness, bony tenderness and deformity.      Comments: Some external rotation of the left hip which is mild.   Skin:     General: Skin is warm and dry.   Neurological:      Mental Status: She is alert and oriented to person, place, and time.      GCS: GCS eye subscore is 4. GCS verbal subscore is 3. GCS motor subscore is 6.      Gait: Gait normal.      Comments: Nl speech   Psychiatric:          Speech: Speech normal.          MDM  Number of Diagnoses or Management Options  Diagnosis management comments: No evidence for head trauma.  Verify left hip fracture.  Patient has low-grade fever.  Check chest x-ray.  Patient has nephrostomy.  Will attempt to obtain urine by somewhat sterile method.       Amount and/or Complexity of Data Reviewed  Clinical lab tests: ordered and reviewed  Tests in the radiology section of CPT??: reviewed and ordered  Tests in the medicine section of CPT??: ordered and reviewed  Obtain history from someone other than the patient: yes  Review and summarize past medical records: yes  Discuss the patient with other providers: yes  Independent visualization of images, tracings, or specimens: yes    Risk of Complications, Morbidity, and/or Mortality  Presenting problems: moderate  Diagnostic procedures: low  Management options: moderate    Patient Progress  Patient progress: stable         Procedures    Xr Chest Sngl V    Result Date: 05/09/2019  EXAMINATION: CHEST RADIOGRAPH 05/09/2019 6:29 PM ACCESSION NUMBER: [id number] INDICATION: Fever, fractured hip COMPARISON: Chest x-ray 04/02/2019, 10/29/2017, chest CT 11/09/2015 TECHNIQUE: A single AP view of the chest was obtained. FINDINGS: Support Lines and Tubes: None Cardiac Silhouette: Within normal limits in size. Lungs: No focal airspace disease. Pleura: No pleural effusion. No pneumothorax. Osseous Structures: Thoracic spine spondylosis. Upper Abdomen: Unremarkable.     IMPRESSION: No evidence of acute cardiopulmonary disease. VOICE DICTATED BY: Dr. Orlean Patten    Xr Hip Lt W Or Wo Pelv 2-3 Vws    Result Date: 05/09/2019  Left hip CLINICAL INDICATION: Left hip pain after a fall. FINDINGS: The bones are osteopenic. There is external rotation to the AP view of the left hip. The bones are diffusely osteopenic. No definite fracture appreciated. There is irregularity at the femoral head-neck junction. This  is more likely to be osteophytes on the lateral view. There is no displacement. Note, occult fracture is certainly possible given the degree of demineralization and suboptimal patient positioning.     IMPRESSION: No definite displaced fracture. Osteoarthritis is evident. Note, given the degree of bone demineralization, occult left hip fracture is certainly possible. If there is strong clinical concern for left hip fracture, consider further evaluation with left hip MRI.  Mri Hip Lt Wo Cont    Result Date: 05/09/2019  MRI OF THE LEFT HIP WITHOUT CONTRAST. Clinical Indication: Left hip pain after a fall Procedure: Multiplanar and multisequence MR images performed of the left hip without gadolinium contrast. Comparison: Plain radiographs of the left hip from earlier the same day Findings: FINDINGS: A nondisplaced subcapital fracture is noted through the left femoral neck. Mild edema like signal is noted in the bilateral pubis bones. This may be a stress reaction or osteitis pubis. The SI joints are intact. The tendons are intact. There is normal signal in the muscles.     IMPRESSION: 1. Nondisplaced subcapital fracture of the left femoral neck. 2. Bilateral edema like signal in the pubis bones may be a stress reaction or osteitis pubis. No fracture lines visualized.      Results Include:    Recent Results (from the past 24 hour(s))   CBC WITH AUTOMATED DIFF    Collection Time: 05/09/19  5:41 PM   Result Value Ref Range    WBC 10.2 4.3 - 11.1 K/uL    RBC 3.94 (L) 4.05 - 5.2 M/uL    HGB 12.1 11.7 - 15.4 g/dL    HCT 38.2 35.8 - 46.3 %    MCV 97.0 79.6 - 97.8 FL    MCH 30.7 26.1 - 32.9 PG    MCHC 31.7 31.4 - 35.0 g/dL    RDW 13.4 11.9 - 14.6 %    PLATELET 197 150 - 450 K/uL    MPV 10.6 9.4 - 12.3 FL    ABSOLUTE NRBC 0.00 0.0 - 0.2 K/uL    DF AUTOMATED      NEUTROPHILS 76 43 - 78 %    LYMPHOCYTES 8 (L) 13 - 44 %    MONOCYTES 15 (H) 4.0 - 12.0 %    EOSINOPHILS 0 (L) 0.5 - 7.8 %    BASOPHILS 0 0.0 - 2.0 %     IMMATURE GRANULOCYTES 0 0.0 - 5.0 %    ABS. NEUTROPHILS 7.8 1.7 - 8.2 K/UL    ABS. LYMPHOCYTES 0.9 0.5 - 4.6 K/UL    ABS. MONOCYTES 1.5 (H) 0.1 - 1.3 K/UL    ABS. EOSINOPHILS 0.0 0.0 - 0.8 K/UL    ABS. BASOPHILS 0.0 0.0 - 0.2 K/UL    ABS. IMM. GRANS. 0.0 0.0 - 0.5 K/UL   METABOLIC PANEL, COMPREHENSIVE    Collection Time: 05/09/19  5:41 PM   Result Value Ref Range    Sodium 138 136 - 145 mmol/L    Potassium 4.0 3.5 - 5.1 mmol/L    Chloride 103 98 - 107 mmol/L    CO2 28 21 - 32 mmol/L    Anion gap 7 7 - 16 mmol/L    Glucose 135 (H) 65 - 100 mg/dL    BUN 29 (H) 8 - 23 MG/DL    Creatinine 0.88 0.6 - 1.0 MG/DL    GFR est AA >60 >60 ml/min/1.11m    GFR est non-AA >60 >60 ml/min/1.732m   Calcium 9.1 8.3 - 10.4 MG/DL    Bilirubin, total 0.4 0.2 - 1.1 MG/DL    ALT (SGPT) 17 12 - 65 U/L    AST (SGOT) 16 15 - 37 U/L    Alk. phosphatase 60 50 - 136 U/L    Protein, total 7.2 6.3 - 8.2 g/dL    Albumin 2.7 (L) 3.2 - 4.6 g/dL    Globulin 4.5 (H) 2.3 - 3.5 g/dL    A-G  Ratio 0.6 (L) 1.2 - 3.5     TYPE & SCREEN    Collection Time: 05/09/19  5:41 PM   Result Value Ref Range    Crossmatch Expiration 05/12/2019     ABO/Rh(D) A POSITIVE     Antibody screen NEG    LACTIC ACID    Collection Time: 05/09/19  5:41 PM   Result Value Ref Range    Lactic acid 1.9 0.4 - 2.0 MMOL/L       Discussed with hospitalist regarding hip fracture.  Also notified orthopedist.

## 2019-05-10 ENCOUNTER — Inpatient Hospital Stay: Admit: 2019-05-10 | Payer: MEDICARE | Primary: Community Health

## 2019-05-10 LAB — EKG, 12 LEAD, INITIAL
Atrial Rate: 106 {beats}/min
Calculated P Axis: 80 degrees
Calculated R Axis: 73 degrees
Calculated T Axis: 90 degrees
P-R Interval: 148 ms
Q-T Interval: 304 ms
QRS Duration: 62 ms
QTC Calculation (Bezet): 403 ms
Ventricular Rate: 106 {beats}/min

## 2019-05-10 LAB — URINALYSIS W/ RFLX MICROSCOPIC
Bacteria: 0 /hpf
Bilirubin: NEGATIVE
Epithelial cells: 0 /hpf
Glucose: NEGATIVE mg/dL
Ketone: NEGATIVE mg/dL
Nitrites: POSITIVE — AB
Protein: 30 mg/dL — AB
Specific gravity: 1.012 (ref 1.001–1.023)
Urobilinogen: 1 EU/dL (ref 0.2–1.0)
pH (UA): 7 (ref 5.0–9.0)

## 2019-05-10 LAB — CBC WITH AUTOMATED DIFF
ABS. BASOPHILS: 0 10*3/uL (ref 0.0–0.2)
ABS. EOSINOPHILS: 0.1 10*3/uL (ref 0.0–0.8)
ABS. IMM. GRANS.: 0 10*3/uL (ref 0.0–0.5)
ABS. LYMPHOCYTES: 1.1 10*3/uL (ref 0.5–4.6)
ABS. MONOCYTES: 0.8 10*3/uL (ref 0.1–1.3)
ABS. NEUTROPHILS: 3.4 10*3/uL (ref 1.7–8.2)
ABSOLUTE NRBC: 0 10*3/uL (ref 0.0–0.2)
BASOPHILS: 0 % (ref 0.0–2.0)
EOSINOPHILS: 2 % (ref 0.5–7.8)
HCT: 35.6 % — ABNORMAL LOW (ref 35.8–46.3)
HGB: 11.2 g/dL — ABNORMAL LOW (ref 11.7–15.4)
IMMATURE GRANULOCYTES: 1 % (ref 0.0–5.0)
LYMPHOCYTES: 20 % (ref 13–44)
MCH: 30.9 PG (ref 26.1–32.9)
MCHC: 31.5 g/dL (ref 31.4–35.0)
MCV: 98.3 FL — ABNORMAL HIGH (ref 79.6–97.8)
MONOCYTES: 15 % — ABNORMAL HIGH (ref 4.0–12.0)
MPV: 10.5 FL (ref 9.4–12.3)
NEUTROPHILS: 63 % (ref 43–78)
PLATELET: 129 10*3/uL — ABNORMAL LOW (ref 150–450)
RBC: 3.62 M/uL — ABNORMAL LOW (ref 4.05–5.2)
RDW: 13.4 % (ref 11.9–14.6)
WBC: 5.4 10*3/uL (ref 4.3–11.1)

## 2019-05-10 LAB — TYPE & SCREEN
ABO/Rh(D): A POS
Antibody screen: NEGATIVE

## 2019-05-10 LAB — MSSA/MRSA SC BY PCR, NASAL SWAB

## 2019-05-10 MED ORDER — SODIUM CHLORIDE 0.9 % IV
INTRAVENOUS | Status: DC
Start: 2019-05-10 — End: 2019-05-10

## 2019-05-10 MED ORDER — LIDOCAINE HCL 1 % (10 MG/ML) IJ SOLN
10 mg/mL (1 %) | INTRAMUSCULAR | Status: DC | PRN
Start: 2019-05-10 — End: 2019-05-16

## 2019-05-10 MED ORDER — TUBERCULIN PPD 5 UNIT/0.1 ML INTRADERMAL
5 tub. unit /0.1 mL | Freq: Once | INTRADERMAL | Status: AC
Start: 2019-05-10 — End: 2019-05-11

## 2019-05-10 MED ORDER — SODIUM CHLORIDE 0.9 % IV
INTRAVENOUS | Status: DC
Start: 2019-05-10 — End: 2019-05-16
  Administered 2019-05-10 – 2019-05-16 (×9): via INTRAVENOUS

## 2019-05-10 MED ORDER — TRAZODONE 50 MG TAB
50 mg | Freq: Every evening | ORAL | Status: DC
Start: 2019-05-10 — End: 2019-05-16
  Administered 2019-05-11 – 2019-05-16 (×6): via ORAL

## 2019-05-10 MED ORDER — ONDANSETRON (PF) 4 MG/2 ML INJECTION
4 mg/2 mL | Freq: Four times a day (QID) | INTRAMUSCULAR | Status: DC | PRN
Start: 2019-05-10 — End: 2019-05-12

## 2019-05-10 MED ORDER — FERROUS SULFATE 325 MG (65 MG ELEMENTAL IRON) TAB
325 mg (65 mg iron) | Freq: Every day | ORAL | Status: DC
Start: 2019-05-10 — End: 2019-05-16
  Administered 2019-05-10 – 2019-05-16 (×12): via ORAL

## 2019-05-10 MED ORDER — ONDANSETRON (PF) 4 MG/2 ML INJECTION
4 mg/2 mL | INTRAMUSCULAR | Status: DC | PRN
Start: 2019-05-10 — End: 2019-05-16

## 2019-05-10 MED ORDER — ASCORBIC ACID 500 MG TAB
500 mg | Freq: Every day | ORAL | Status: DC
Start: 2019-05-10 — End: 2019-05-16
  Administered 2019-05-10 – 2019-05-16 (×7): via ORAL

## 2019-05-10 MED ORDER — PROPOFOL 10 MG/ML IV EMUL
10 mg/mL | INTRAVENOUS | Status: DC | PRN
Start: 2019-05-10 — End: 2019-05-10
  Administered 2019-05-10: 18:00:00 via INTRAVENOUS

## 2019-05-10 MED ORDER — SENNOSIDES-DOCUSATE SODIUM 8.6 MG-50 MG TAB
Freq: Every day | ORAL | Status: DC
Start: 2019-05-10 — End: 2019-05-16
  Administered 2019-05-10 – 2019-05-16 (×7): via ORAL

## 2019-05-10 MED ORDER — ALUM-MAG HYDROXIDE-SIMETH 200 MG-200 MG-20 MG/5 ML ORAL SUSP
200-200-20 mg/5 mL | ORAL | Status: DC | PRN
Start: 2019-05-10 — End: 2019-05-16

## 2019-05-10 MED ORDER — LACTATED RINGERS IV
INTRAVENOUS | Status: DC
Start: 2019-05-10 — End: 2019-05-10
  Administered 2019-05-10: 19:00:00 via INTRAVENOUS

## 2019-05-10 MED ORDER — OXYCODONE 5 MG TAB
5 mg | Freq: Once | ORAL | Status: DC | PRN
Start: 2019-05-10 — End: 2019-05-10

## 2019-05-10 MED ORDER — LIDOCAINE (PF) 20 MG/ML (2 %) IJ SOLN
20 mg/mL (2 %) | INTRAMUSCULAR | Status: DC | PRN
Start: 2019-05-10 — End: 2019-05-10
  Administered 2019-05-10: 18:00:00 via INTRAVENOUS

## 2019-05-10 MED ORDER — ADV ADDAPTOR
500 mg | Freq: Three times a day (TID) | Status: DC
Start: 2019-05-10 — End: 2019-05-13
  Administered 2019-05-10 – 2019-05-13 (×12): via INTRAVENOUS

## 2019-05-10 MED ORDER — PROPOFOL 10 MG/ML IV EMUL
10 mg/mL | INTRAVENOUS | Status: AC
Start: 2019-05-10 — End: ?

## 2019-05-10 MED ORDER — PROPOFOL 10 MG/ML IV EMUL
10 mg/mL | INTRAVENOUS | Status: DC | PRN
Start: 2019-05-10 — End: 2019-05-10
  Administered 2019-05-10 (×3): via INTRAVENOUS

## 2019-05-10 MED ORDER — MORPHINE 2 MG/ML INJECTION
2 mg/mL | INTRAMUSCULAR | Status: DC | PRN
Start: 2019-05-10 — End: 2019-05-11

## 2019-05-10 MED ORDER — BUPIVACAINE-DEXTROSE-WATER(PF) 7.5 MG/ML (0.75 %) (SPINAL) IJ SOLN
0.75 % (7.5 mg/mL) | INTRAMUSCULAR | Status: AC
Start: 2019-05-10 — End: 2019-05-10
  Administered 2019-05-10: 18:00:00 via INTRATHECAL

## 2019-05-10 MED ORDER — DIPHENHYDRAMINE HCL 50 MG/ML IJ SOLN
50 mg/mL | INTRAMUSCULAR | Status: DC | PRN
Start: 2019-05-10 — End: 2019-05-10

## 2019-05-10 MED ORDER — SODIUM CHLORIDE 0.9 % IJ SYRG
Freq: Three times a day (TID) | INTRAMUSCULAR | Status: DC
Start: 2019-05-10 — End: 2019-05-16
  Administered 2019-05-10 – 2019-05-16 (×18): via INTRAVENOUS

## 2019-05-10 MED ORDER — PHENYLEPHRINE 10 MG/ML INJECTION
10 mg/mL | INTRAMUSCULAR | Status: DC | PRN
Start: 2019-05-10 — End: 2019-05-10
  Administered 2019-05-10 (×8): via INTRAVENOUS

## 2019-05-10 MED ORDER — CALCIUM-CHOLECALCIFEROL (D3) 500 MG (1,250 MG)-200 UNIT TAB
500 mg-200 unit | Freq: Three times a day (TID) | ORAL | Status: DC
Start: 2019-05-10 — End: 2019-05-16
  Administered 2019-05-11 – 2019-05-16 (×17): via ORAL

## 2019-05-10 MED ORDER — SODIUM CHLORIDE 0.9 % IV PIGGY BACK
1 gram | Freq: Three times a day (TID) | INTRAVENOUS | Status: AC
Start: 2019-05-10 — End: 2019-05-11
  Administered 2019-05-11 (×2): via INTRAVENOUS

## 2019-05-10 MED ORDER — ATORVASTATIN 10 MG TAB
10 mg | Freq: Every evening | ORAL | Status: DC
Start: 2019-05-10 — End: 2019-05-16
  Administered 2019-05-11 – 2019-05-16 (×6): via ORAL

## 2019-05-10 MED ORDER — SODIUM CHLORIDE 0.9 % IJ SYRG
Freq: Three times a day (TID) | INTRAMUSCULAR | Status: DC
Start: 2019-05-10 — End: 2019-05-16
  Administered 2019-05-11 – 2019-05-16 (×16): via INTRAVENOUS

## 2019-05-10 MED ORDER — CALCIUM CARBONATE 500 MG (1250 MG) TAB
500 mg calcium (1,250 mg) | Freq: Every day | ORAL | Status: DC
Start: 2019-05-10 — End: 2019-05-16
  Administered 2019-05-10 – 2019-05-16 (×7): via ORAL

## 2019-05-10 MED ORDER — ASPIRIN 325 MG TAB
325 mg | Freq: Every day | ORAL | Status: DC
Start: 2019-05-10 — End: 2019-05-16
  Administered 2019-05-11 – 2019-05-16 (×6): via ORAL

## 2019-05-10 MED ORDER — CEFAZOLIN 2 GRAM/20 ML IN STERILE WATER INTRAVENOUS SYRINGE
2 gram/0 mL | Freq: Once | INTRAVENOUS | Status: AC
Start: 2019-05-10 — End: 2019-05-10
  Administered 2019-05-10: 11:00:00 via INTRAVENOUS

## 2019-05-10 MED ORDER — MORPHINE 2 MG/ML INJECTION
2 mg/mL | INTRAMUSCULAR | Status: DC | PRN
Start: 2019-05-10 — End: 2019-05-10

## 2019-05-10 MED ORDER — ONDANSETRON (PF) 4 MG/2 ML INJECTION
4 mg/2 mL | Freq: Once | INTRAMUSCULAR | Status: DC | PRN
Start: 2019-05-10 — End: 2019-05-10

## 2019-05-10 MED ORDER — SODIUM CHLORIDE 0.9 % IJ SYRG
INTRAMUSCULAR | Status: DC | PRN
Start: 2019-05-10 — End: 2019-05-16

## 2019-05-10 MED ORDER — HYDROCODONE-ACETAMINOPHEN 5 MG-325 MG TAB
5-325 mg | ORAL | Status: DC | PRN
Start: 2019-05-10 — End: 2019-05-16
  Administered 2019-05-11 – 2019-05-16 (×4): via ORAL

## 2019-05-10 MED ORDER — BUPIVACAINE-DEXTROSE-WATER(PF) 7.5 MG/ML (0.75 %) (SPINAL) IJ SOLN
0.75 % (7.5 mg/mL) | INTRAMUSCULAR | Status: AC
Start: 2019-05-10 — End: ?

## 2019-05-10 MED ORDER — .PHARMACY TO SUBSTITUTE PER PROTOCOL
Status: DC | PRN
Start: 2019-05-10 — End: 2019-05-10

## 2019-05-10 MED ORDER — PAROXETINE 20 MG TAB
20 mg | Freq: Every day | ORAL | Status: DC
Start: 2019-05-10 — End: 2019-05-16
  Administered 2019-05-10 – 2019-05-16 (×7): via ORAL

## 2019-05-10 MED ORDER — LACTATED RINGERS IV
INTRAVENOUS | Status: DC
Start: 2019-05-10 — End: 2019-05-10
  Administered 2019-05-10: 17:00:00 via INTRAVENOUS

## 2019-05-10 MED ORDER — HYDROMORPHONE (PF) 2 MG/ML IJ SOLN
2 mg/mL | INTRAMUSCULAR | Status: DC | PRN
Start: 2019-05-10 — End: 2019-05-10

## 2019-05-10 MED ORDER — LIDOCAINE (PF) 20 MG/ML (2 %) IJ SOLN
20 mg/mL (2 %) | INTRAMUSCULAR | Status: AC
Start: 2019-05-10 — End: ?

## 2019-05-10 MED ORDER — ACETAMINOPHEN 325 MG TABLET
325 mg | Freq: Three times a day (TID) | ORAL | Status: DC
Start: 2019-05-10 — End: 2019-05-16
  Administered 2019-05-10 – 2019-05-16 (×17): via ORAL

## 2019-05-10 MED ORDER — EPHEDRINE (PF) 50 MG/5 ML (10 MG/ML) IN NS IV SYRINGE
50510 mg/5 mL (10 mg/mL) | INTRAVENOUS | Status: DC | PRN
Start: 2019-05-10 — End: 2019-05-10
  Administered 2019-05-10 (×3): via INTRAVENOUS

## 2019-05-10 MED ORDER — ONDANSETRON (PF) 4 MG/2 ML INJECTION
4 mg/2 mL | INTRAMUSCULAR | Status: DC | PRN
Start: 2019-05-10 — End: 2019-05-11

## 2019-05-10 MED FILL — FERROUS SULFATE 325 MG (65 MG ELEMENTAL IRON) TAB: 325 mg (65 mg iron) | ORAL | Qty: 1

## 2019-05-10 MED FILL — MARCAINE SPINAL (PF) 0.75 % (7.5 MG/ML) INJECTION SOLUTION: 0.75 % (7.5 mg/mL) | INTRAMUSCULAR | Qty: 2

## 2019-05-10 MED FILL — PAROXETINE 20 MG TAB: 20 mg | ORAL | Qty: 1

## 2019-05-10 MED FILL — OYSTER SHELL CALCIUM 500  500 MG CALCIUM (1,250 MG) TABLET: 500 mg calcium (1,250 mg) | ORAL | Qty: 1

## 2019-05-10 MED FILL — LACTATED RINGERS IV: INTRAVENOUS | Qty: 1000

## 2019-05-10 MED FILL — SODIUM CHLORIDE 0.9 % IV: INTRAVENOUS | Qty: 1000

## 2019-05-10 MED FILL — PROPOFOL 10 MG/ML IV EMUL: 10 mg/mL | INTRAVENOUS | Qty: 20

## 2019-05-10 MED FILL — LIDOCAINE HCL 1 % (10 MG/ML) IJ SOLN: 10 mg/mL (1 %) | INTRAMUSCULAR | Qty: 0.1

## 2019-05-10 MED FILL — VITAMIN C 500 MG TABLET: 500 mg | ORAL | Qty: 2

## 2019-05-10 MED FILL — CEFAZOLIN 1 GRAM IV SOLUTION: 1 gram | INTRAVENOUS | Qty: 1000

## 2019-05-10 MED FILL — MAPAP (ACETAMINOPHEN) 325 MG TABLET: 325 mg | ORAL | Qty: 2

## 2019-05-10 MED FILL — MEROPENEM 500 MG IV SOLR: 500 mg | INTRAVENOUS | Qty: 500

## 2019-05-10 MED FILL — XYLOCAINE-MPF 20 MG/ML (2 %) INJECTION SOLUTION: 20 mg/mL (2 %) | INTRAMUSCULAR | Qty: 5

## 2019-05-10 MED FILL — STOOL SOFTENER-STIMULANT LAXATIVE 8.6 MG-50 MG TABLET: ORAL | Qty: 1

## 2019-05-10 MED FILL — CEFAZOLIN 2 GRAM/20 ML IN STERILE WATER INTRAVENOUS SYRINGE: 2 gram/0 mL | INTRAVENOUS | Qty: 20

## 2019-05-10 NOTE — Progress Notes (Signed)
Patient arrived via wheelchair transport at 0353 from ER.  Patient has left femur fracture from a fall that happened earlier at [facility], where patient is resident and patient is on bedrest. Patient grimacing when touching left hip area with no other distress noted.  Patient alert and oriented to person with confusion. Patient whispered her name otherwise patient is non-verbal.  Pupils are equally reactive to light. Patient grabbing at this nurse and Tech while performing assessment and vitals. Patient has nephrostomy to right side with clear-yellow urine draining into bag.  Patient has brief on due to incontinence of bowels. Patient bottom intact and no open areas noted.  Skin is pale and cool to the touch.  Patient 02 at 4 liters via NC and respirations are even and unlabored with 02 saturation at 100%.  Heart rate 87 and heart sounds are S1 and S2.  Bowel sounds in all 4 quadrants and abdomen is soft and non-tender. Safety measures are in place with bed alarm on and call light in reach with staff frequently rounding.  Will continue to monitor patient.

## 2019-05-10 NOTE — Progress Notes (Signed)
SBAR received from Philemon Kingdom, Therapist, sports. Patient stable, lying in bed, in no apparent distress. Breathing room air. Respirations even and unlabored. Bed in locked and low position. Call light within reach. Droplet plus precautions maintained.

## 2019-05-10 NOTE — Progress Notes (Signed)
TRANSFER - IN REPORT:    Verbal report received from Taylor(name) on Cathy Jackson  being received from post-op(unit) for routine progression of care      Report consisted of patient???s Situation, Background, Assessment and   Recommendations(SBAR).     Information from the following report(s) SBAR was reviewed with the receiving nurse.    Opportunity for questions and clarification was provided.

## 2019-05-10 NOTE — Progress Notes (Signed)
Spoke to [facility] PACU charge RN who verified that pts daughter Earnest Bailey would be able to see pt before she is taken back up to 5th floor from PACU. Holly's number is in the chart and she will be in waiting area while pt is in OR.

## 2019-05-10 NOTE — Progress Notes (Signed)
Patient received back to room 503. Receiving oxygen 3 L/min via NC at this time. NAD noted. Surgical site/dressing (left hip) remains clean dry and intact. Dorsalis pedis and posttibial pulses remain present and palpable. Patient remains confused and intermittently aphasic, which is her baseline. Call light within reach. Bed in locked and low position. Bed alarm activated.

## 2019-05-10 NOTE — Consults (Signed)
IRC/physiatry consult received  Deferred due to pt being resident of a nursing home, bedridden with sever frontotemporal dementia and is non verbal.. Admitted s/p fall at facility on 9/14 sustaining a subcapital left femoral neck fx. IRC ordered under the geriatric osteoporotic hip fx protocol.  Pt will return to outside facility post dc and IRC would not be appropriate. Given hx of severe dementia and physical debility, prognosis for meaningful recovery is poor. Pt with hx of ESBL Klebsiella UTI recently on abx with current + UA, started on Meropenem    No charge  Post op PT/OT  Josiel Gahm R. Nadean Corwin, MD, Medical Director  [facility]

## 2019-05-10 NOTE — Other (Signed)
TRANSFER - OUT REPORT:    Verbal report given to [place], RN on Cathy Jackson  being transferred to 503 for routine post - op       Report consisted of patient???s Situation, Background, Assessment and   Recommendations(SBAR).     Information from the following report(s) OR Summary, Procedure Summary, Intake/Output and MAR was reviewed with the receiving nurse.    Lines:   Peripheral IV 05/09/19 Right;Inner Forearm (Active)   Site Assessment Clean, dry, & intact 05/10/19 1510   Phlebitis Assessment 0 05/10/19 1510   Infiltration Assessment 0 05/10/19 1510   Dressing Status Clean, dry, & intact 05/10/19 1510   Dressing Type Tape;Transparent 05/10/19 1510   Hub Color/Line Status Patent 05/10/19 1510   Alcohol Cap Used No 05/10/19 1510        Opportunity for questions and clarification was provided.      Patient transported with:   O2 @ 3 liters    VTE prophylaxis orders have not been written for [name].    Patient and family given floor number and nurses name.  Family updated re: pt status after security code verified.

## 2019-05-10 NOTE — Other (Signed)
Xray called about xray

## 2019-05-10 NOTE — Other (Signed)
Xray completed. Pt can head to floor

## 2019-05-10 NOTE — Anesthesia Post-Procedure Evaluation (Signed)
Procedure(s):  LEFT FEMORAL NECK SYSTEM.    spinal    Anesthesia Post Evaluation      Multimodal analgesia: multimodal analgesia used between 6 hours prior to anesthesia start to PACU discharge  Patient location during evaluation: bedside  Patient participation: complete - patient participated  Level of consciousness: responsive to verbal stimuli  Pain management: adequate  Airway patency: patent  Anesthetic complications: no  Cardiovascular status: hemodynamically stable  Respiratory status: spontaneous ventilation  Hydration status: stable        INITIAL Post-op Vital signs:   Vitals Value Taken Time   BP 116/56 05/10/2019  3:35 PM   Temp 36.4 ??C (97.6 ??F) 05/10/2019  3:10 PM   Pulse 97 05/10/2019  3:39 PM   Resp 16 05/10/2019  3:10 PM   SpO2 98 % 05/10/2019  3:39 PM   Vitals shown include unvalidated device data.

## 2019-05-10 NOTE — H&P (View-Only) (Signed)
[place]  Consultation Note    Patient ID:  Name: Cathy Jackson  MRN: [id number]  AGE: 73 y.o.  DOB: Jan 05, 1946    Date of Consultation:  May 10, 2019  Referring Physician:  hospitalist    Subjective: Pt has underlying dementia and is unable to provide any information pertaining to the left hip injury.     On chart review:   Patient is a 73 y/o female with severe frontotemporal dementia, debility, bladder cancer, s/p ileal conduit, s/p nephro-ureteral tube placement 03/29/19, hypothyroid, GERD who suffered a fall today at a rehab facility. She was recently admitted 8/8-8/12 for ESBL Klebsiella UTI. Patient tried to stand but she could not. There was concern that she could have broken her hip. Xray does not show definite displaced fracture, but MRI reveals subcapital fracture of the left femoral neck. Bilateral edema like signal in the pubis bones may be a stress reaction or osteitis pubis. She also has a low grade temp of 100.2 without elevated WBC ct. Chest xray is clear. Her urine in bag is dark, concentrated and cloudy with sediment. UA with culture is pending. She will be admitted for further workup and treatment.   ??  10 systems reviewed and negative except as noted in HPI. She is non verbal     Past Medical History Includes:   Past Medical History:   Diagnosis Date   ??? Abdominal pain, unspecified site 10/27/2012   ??? Arthritis     generalized   ??? Asthma 10/27/2012   ??? Bladder cancer ([place])    ??? Depression     managed with medication    ??? Dysuria 10/27/2012   ??? Environmental allergies     managed with medication    ??? GERD (gastroesophageal reflux disease) 10/27/2012   ??? History of kidney stones     states surgical removed   ??? Hypothyroidism     managed with medication    ??? Other "heavy-for-dates" infants     ureterectomy and ileal conduit   ??? Unspecified disorder of bladder 10/27/2012   ??? Urethral caruncle 10/27/2012   ??? Urinary frequency 10/27/2012    ??? Urinary tract infection, site not specified 10/27/2012   ,   Past Surgical History:   Procedure Laterality Date   ??? CLOSE CYSTOSTOMY      cystoscopy/ turbt x 2   ??? HX BREAST BIOPSY Left 2000    x 2   ??? HX CESAREAN SECTION      x 1   ??? HX CHOLECYSTECTOMY     ??? HX NEPHROSTOMY  2014   ??? HX TUBAL LIGATION     ??? HX WRIST FRACTURE TX Left    ??? IR CHANGE INTERNAL URETERAL STENT SI  02/15/2018   ??? IR CHANGE INTERNAL URETERAL STENT SI  04/15/2018   ??? IR CHANGE INTERNAL URETERAL STENT SI  06/14/2018   ??? IR CHANGE URET TUBE VIA ILEAL CONDUIT  09/07/2018   ??? IR CHANGE URET TUBE VIA ILEAL CONDUIT  12/20/2018   ??? IR CHANGE URET TUBE VIA ILEAL CONDUIT  03/21/2019   ??? IR CONVERT NEPHRO PERC RT TO NEPHROURETERAL CATH EXISTING SI  11/12/2017   ??? IR EXCHANGE NEPHRO PERC RT SI  10/23/2017   ??? IR NEPHROURETERAL PERC RT PLC CATH NEW ACCESS  SI  03/29/2019     Family History:   Family History   Problem Relation Age of Onset   ??? Stroke Mother    ???  Heart Disease Mother    ??? Cancer Father    ??? Asthma Father    ??? Lung Disease Father         COPD/smoker   ??? Cancer Brother    ??? Heart Disease Brother    ??? Alcohol abuse Brother       Social History:   Social History     Tobacco Use   ??? Smoking status: Former Smoker     Packs/day: 0.50     Years: 20.00     Pack years: 10.00   ??? Smokeless tobacco: Never Used   ??? Tobacco comment: stopped at age 28   Substance Use Topics   ??? Alcohol use: No       ALLERGIES: No Known Allergies     Patient Medications    Current Facility-Administered Medications   Medication Dose Route Frequency   ??? traZODone (DESYREL) tablet 50 mg  50 mg Oral QHS   ??? atorvastatin (LIPITOR) tablet 10 mg  10 mg Oral QHS   ??? senna-docusate (PERICOLACE) 8.6-50 mg per tablet 1 Tab  1 Tab Oral DAILY   ??? PARoxetine (PAXIL) tablet 20 mg  20 mg Oral DAILY   ??? ferrous sulfate tablet 325 mg  1 Tab Oral ACB   ??? calcium carbonate (OS-CAL) tablet 500 mg [elemental]  500 mg Oral DAILY   ??? ascorbic acid (vitamin C) (VITAMIN C) tablet 1,000 mg  1,000 mg Oral  DAILY   ??? 0.9% sodium chloride infusion  100 mL/hr IntraVENous CONTINUOUS   ??? sodium chloride (NS) flush 5-40 mL  5-40 mL IntraVENous Q8H   ??? sodium chloride (NS) flush 5-40 mL  5-40 mL IntraVENous PRN   ??? acetaminophen (TYLENOL) tablet 650 mg  650 mg Oral Q8H   ??? tuberculin injection 5 Units  5 Units IntraDERMal ONCE   ??? ondansetron (ZOFRAN) injection 4 mg  4 mg IntraVENous Q6H PRN   ??? HYDROcodone-acetaminophen (NORCO) 5-325 mg per tablet 1 Tab  1 Tab Oral Q4H PRN   ??? morphine injection 2 mg  2 mg IntraVENous Q4H PRN   ??? ondansetron (ZOFRAN) injection 4 mg  4 mg IntraVENous Q4H PRN   ??? meropenem (MERREM) 500 mg in 0.9% sodium chloride (MBP/ADV) 50 mL  0.5 g IntraVENous Q8H   ??? 0.9% sodium chloride infusion  75 mL/hr IntraVENous CONTINUOUS         Review of Systems:  Review of systems not obtained due to patient factors.    Physical Exam:      General: NAD, Alert but disoriented  Mental Status: Appropriate   Psych: Normal Affect, Normal Mood    HEENT: Normal Cephalic/Atraumatic, PERRL   Lungs: Respirations even and unlabored, Breath Sounds were clear, no respiratory distress   Heart: Regular Rate and Rhythm   Vascular: Distal pulses intact, good capillary refill   Skin: No redness, No Rashes, Skin is dry   Musculoskeletal: exam of both lower extremities reveal good ROM of both hips. The patient has some pain on rom of the left hip. Distal pulses intact. calfs are soft and non-tender  Lymphatic: No lympahdenopathy, No distal edema   Neuro: No gross deficits   Abdomen: Soft, Non tender, No distension      VITALS:   Patient Vitals for the past 8 hrs:   BP Temp Pulse Resp SpO2   05/10/19 0811 103/62 97.8 ??F (36.6 ??C) 91 18 91 %   05/10/19 0418 106/65 97.6 ??F (36.4 ??C) 87 18 100 %  05/10/19 0257 ??? 98.1 ??F (36.7 ??C) ??? ??? 99 %   05/10/19 0155 ??? ??? 84 ??? 94 %    , Temp (24hrs), Avg:98.4 ??F (36.9 ??C), Min:97.6 ??F (36.4 ??C), Max:100.2 ??F (37.9 ??C)         X-ray and MRI: reviewed on EMR    Diagnosis    Patient Active Problem List   Diagnosis Code   ??? Bladder cancer ([place]) C67.9   ??? Urethral caruncle N36.2   ??? Unspecified disorder of bladder N32.9   ??? Asthma J45.909   ??? GERD without esophagitis K21.9   ??? Acquired hypothyroidism E03.9   ??? Essential hypertension I10   ??? Primary osteoarthritis involving multiple joints M89.49   ??? Dysthymia F34.1   ??? Mixed hyperlipidemia E78.2   ??? Perennial allergic rhinitis J30.89   ??? Frontotemporal dementia (HCC) G31.09, F02.80   ??? Allergic rhinitis J30.9   ??? Iron deficiency anemia D50.9   ??? Depression with anxiety F41.8   ??? Hypotension I95.9   ??? AKI (acute kidney injury) ([place]) N17.9   ??? Acute metabolic encephalopathy [id number]   ??? Urinary tract infection due to ESBL Klebsiella N39.0, B96.89   ??? Lactic acidosis E87.2   ??? Hypoalbuminemia E88.09   ??? Severe sepsis with acute organ dysfunction (HCC) A41.9, R65.20   ??? Closed left hip fracture (HCC) S72.002A   ??? Low grade fever R50.9   ??? Dehydration E86.0        Assessment and Plan:     Non-displaced closed left hip fracture: Dr. Blima Ledger is aware. Patient scheduled for left hip femoral neck system for today. Patient will likely require rehab placement postoperatively. Medical mgmt per hospitalist.  Preop orders sign and held    Kandy Garrison, [place]  05/10/2019,  9:26 AM

## 2019-05-10 NOTE — Progress Notes (Signed)
Patient in bed and 02 on 2 liters via NC without any breathing difficulty. No pain or distress at this time. Safety measures in place and call light in reach. Will prepare report for oncoming nurse.

## 2019-05-10 NOTE — Anesthesia Pre-Procedure Evaluation (Signed)
Anesthetic History               Review of Systems / Medical History  Patient summary reviewed and pertinent labs reviewed    Pulmonary            Asthma        Neuro/Psych         Dementia (Fairly advanced)     Cardiovascular    Hypertension              Exercise tolerance: <4 METS: Inactive and immobile     GI/Hepatic/Renal     GERD           Endo/Other      Hypothyroidism: well controlled  Arthritis and cancer (H/o Bladder Cancer)     Other Findings              Physical Exam    Airway  Mallampati: II  TM Distance: 4 - 6 cm  Neck ROM: normal range of motion   Mouth opening: Normal     Cardiovascular  Regular rate and rhythm,  S1 and S2 normal,  no murmur, click, rub, or gallop          Pertinent negatives: No murmur   Dental    Dentition: Edentulous     Pulmonary  Breath sounds clear to auscultation               Abdominal         Other Findings            Anesthetic Plan    ASA: 3  Anesthesia type: spinal            Anesthetic plan and risks discussed with: Patient and Son / Daughter

## 2019-05-10 NOTE — Progress Notes (Addendum)
IV abx, merem, tubed to pre-op via tube system at this time.

## 2019-05-10 NOTE — Consults (Addendum)
[facility]  Consultation Note    Patient ID:  Name: Cathy Jackson  MRN: [id number]  AGE: 73 y.o.  DOB: March 19, 1946    Date of Consultation:  May 10, 2019  Referring Physician:  hospitalist    Subjective: Pt has underlying dementia and is unable to provide any information pertaining to the left hip injury.     On chart review:   Patient is a 73 y/o female with severe frontotemporal dementia, debility, bladder cancer, s/p ileal conduit, s/p nephro-ureteral tube placement 03/29/19, hypothyroid, GERD who suffered a fall today at a rehab facility. She was recently admitted 8/8-8/12 for ESBL Klebsiella UTI. Patient tried to stand but she could not. There was concern that she could have broken her hip. Xray does not show definite displaced fracture, but MRI reveals subcapital fracture of the left femoral neck. Bilateral edema like signal in the pubis bones may be a stress reaction or osteitis pubis. She also has a low grade temp of 100.2 without elevated WBC ct. Chest xray is clear. Her urine in bag is dark, concentrated and cloudy with sediment. UA with culture is pending. She will be admitted for further workup and treatment.   ??  10 systems reviewed and negative except as noted in HPI. She is non verbal     Past Medical History Includes:   Past Medical History:   Diagnosis Date   ??? Abdominal pain, unspecified site 10/27/2012   ??? Arthritis     generalized   ??? Asthma 10/27/2012   ??? Bladder cancer ([place])    ??? Depression     managed with medication    ??? Dysuria 10/27/2012   ??? Environmental allergies     managed with medication    ??? GERD (gastroesophageal reflux disease) 10/27/2012   ??? History of kidney stones     states surgical removed   ??? Hypothyroidism     managed with medication    ??? Other "heavy-for-dates" infants     ureterectomy and ileal conduit   ??? Unspecified disorder of bladder 10/27/2012   ??? Urethral caruncle 10/27/2012   ??? Urinary frequency 10/27/2012    ??? Urinary tract infection, site not specified 10/27/2012   ,   Past Surgical History:   Procedure Laterality Date   ??? CLOSE CYSTOSTOMY      cystoscopy/ turbt x 2   ??? HX BREAST BIOPSY Left 2000    x 2   ??? HX CESAREAN SECTION      x 1   ??? HX CHOLECYSTECTOMY     ??? HX NEPHROSTOMY  2014   ??? HX TUBAL LIGATION     ??? HX WRIST FRACTURE TX Left    ??? IR CHANGE INTERNAL URETERAL STENT SI  02/15/2018   ??? IR CHANGE INTERNAL URETERAL STENT SI  04/15/2018   ??? IR CHANGE INTERNAL URETERAL STENT SI  06/14/2018   ??? IR CHANGE URET TUBE VIA ILEAL CONDUIT  09/07/2018   ??? IR CHANGE URET TUBE VIA ILEAL CONDUIT  12/20/2018   ??? IR CHANGE URET TUBE VIA ILEAL CONDUIT  03/21/2019   ??? IR CONVERT NEPHRO PERC RT TO NEPHROURETERAL CATH EXISTING SI  11/12/2017   ??? IR EXCHANGE NEPHRO PERC RT SI  10/23/2017   ??? IR NEPHROURETERAL PERC RT PLC CATH NEW ACCESS  SI  03/29/2019     Family History:   Family History   Problem Relation Age of Onset   ??? Stroke Mother    ???  Heart Disease Mother    ??? Cancer Father    ??? Asthma Father    ??? Lung Disease Father         COPD/smoker   ??? Cancer Brother    ??? Heart Disease Brother    ??? Alcohol abuse Brother       Social History:   Social History     Tobacco Use   ??? Smoking status: Former Smoker     Packs/day: 0.50     Years: 20.00     Pack years: 10.00   ??? Smokeless tobacco: Never Used   ??? Tobacco comment: stopped at age 56   Substance Use Topics   ??? Alcohol use: No       ALLERGIES: No Known Allergies     Patient Medications    Current Facility-Administered Medications   Medication Dose Route Frequency   ??? traZODone (DESYREL) tablet 50 mg  50 mg Oral QHS   ??? atorvastatin (LIPITOR) tablet 10 mg  10 mg Oral QHS   ??? senna-docusate (PERICOLACE) 8.6-50 mg per tablet 1 Tab  1 Tab Oral DAILY   ??? PARoxetine (PAXIL) tablet 20 mg  20 mg Oral DAILY   ??? ferrous sulfate tablet 325 mg  1 Tab Oral ACB   ??? calcium carbonate (OS-CAL) tablet 500 mg [elemental]  500 mg Oral DAILY   ??? ascorbic acid (vitamin C) (VITAMIN C) tablet 1,000 mg  1,000 mg Oral  DAILY   ??? 0.9% sodium chloride infusion  100 mL/hr IntraVENous CONTINUOUS   ??? sodium chloride (NS) flush 5-40 mL  5-40 mL IntraVENous Q8H   ??? sodium chloride (NS) flush 5-40 mL  5-40 mL IntraVENous PRN   ??? acetaminophen (TYLENOL) tablet 650 mg  650 mg Oral Q8H   ??? tuberculin injection 5 Units  5 Units IntraDERMal ONCE   ??? ondansetron (ZOFRAN) injection 4 mg  4 mg IntraVENous Q6H PRN   ??? HYDROcodone-acetaminophen (NORCO) 5-325 mg per tablet 1 Tab  1 Tab Oral Q4H PRN   ??? morphine injection 2 mg  2 mg IntraVENous Q4H PRN   ??? ondansetron (ZOFRAN) injection 4 mg  4 mg IntraVENous Q4H PRN   ??? meropenem (MERREM) 500 mg in 0.9% sodium chloride (MBP/ADV) 50 mL  0.5 g IntraVENous Q8H   ??? 0.9% sodium chloride infusion  75 mL/hr IntraVENous CONTINUOUS         Review of Systems:  Review of systems not obtained due to patient factors.    Physical Exam:      General: NAD, Alert but disoriented  Mental Status: Appropriate   Psych: Normal Affect, Normal Mood    HEENT: Normal Cephalic/Atraumatic, PERRL   Lungs: Respirations even and unlabored, Breath Sounds were clear, no respiratory distress   Heart: Regular Rate and Rhythm   Vascular: Distal pulses intact, good capillary refill   Skin: No redness, No Rashes, Skin is dry   Musculoskeletal: exam of both lower extremities reveal good ROM of both hips. The patient has some pain on rom of the left hip. Distal pulses intact. calfs are soft and non-tender  Lymphatic: No lympahdenopathy, No distal edema   Neuro: No gross deficits   Abdomen: Soft, Non tender, No distension      VITALS:   Patient Vitals for the past 8 hrs:   BP Temp Pulse Resp SpO2   05/10/19 0811 103/62 97.8 ??F (36.6 ??C) 91 18 91 %   05/10/19 0418 106/65 97.6 ??F (36.4 ??C) 87 18 100 %  05/10/19 0257 ??? 98.1 ??F (36.7 ??C) ??? ??? 99 %   05/10/19 0155 ??? ??? 84 ??? 94 %    , Temp (24hrs), Avg:98.4 ??F (36.9 ??C), Min:97.6 ??F (36.4 ??C), Max:100.2 ??F (37.9 ??C)         X-ray and MRI: reviewed on EMR    Diagnosis    Patient Active Problem List   Diagnosis Code   ??? Bladder cancer ([place]) C67.9   ??? Urethral caruncle N36.2   ??? Unspecified disorder of bladder N32.9   ??? Asthma J45.909   ??? GERD without esophagitis K21.9   ??? Acquired hypothyroidism E03.9   ??? Essential hypertension I10   ??? Primary osteoarthritis involving multiple joints M89.49   ??? Dysthymia F34.1   ??? Mixed hyperlipidemia E78.2   ??? Perennial allergic rhinitis J30.89   ??? Frontotemporal dementia (HCC) G31.09, F02.80   ??? Allergic rhinitis J30.9   ??? Iron deficiency anemia D50.9   ??? Depression with anxiety F41.8   ??? Hypotension I95.9   ??? AKI (acute kidney injury) ([place]) N17.9   ??? Acute metabolic encephalopathy Q000111Q   ??? Urinary tract infection due to ESBL Klebsiella N39.0, B96.89   ??? Lactic acidosis E87.2   ??? Hypoalbuminemia E88.09   ??? Severe sepsis with acute organ dysfunction (HCC) A41.9, R65.20   ??? Closed left hip fracture (HCC) S72.002A   ??? Low grade fever R50.9   ??? Dehydration E86.0        Assessment and Plan:     Non-displaced closed left hip fracture: Dr. Blima Ledger is aware. Patient scheduled for left hip femoral neck system for today. Patient will likely require rehab placement postoperatively. Medical mgmt per hospitalist.  Preop orders sign and held    Kandy Garrison, [place]  05/10/2019,  9:26 AM

## 2019-05-10 NOTE — Progress Notes (Signed)
Pt admitted to to rm 503 pt has hx of dementia. CM spoke with  daughter Domingo Dimes [phone].  Pt was at [facility] for extended [facility]. Daughter also stated that they are waiting on a Medicaid bed at facility. Daughter stated that she was the main source for decision for her mother and that they are in the finalization with  A lawyer to complete HCPA paperwork.  CM sent referral to [facility] for pt to return to facility at discharge.  PPD ordered, pt will need PT and OT ordered when medically steady to participate.  Please consult case manager if any new issues arise  Discharge plan is to return to [facility] when medically ready and insurance and bed is available at [place] will continue to follow.    Care Management Interventions  PCP Verified by CM: Yes(Luis Tomi Bamberger NP)  Mode of Transport at Discharge: BLS  Transition of Care Consult (CM Consult): Discharge Planning  Physical Therapy Consult: Yes  Occupational Therapy Consult: Yes  Current Support Network: [facility], Family Lives Nearby([facility])  Confirm Follow Up Transport: Family  The Plan for Transition of Care is Related to the Following Treatment Goals : ongoing therapy needs  The Patient and/or Patient Representative was Provided with a Choice of Provider and Agrees with the Discharge Plan?: Yes  Freedom of Choice List was Provided with Basic Dialogue that Supports the Patient's Individualized Plan of Care/Goals, Treatment Preferences and Shares the Quality Data Associated with the Providers?: Yes  Discharge Location  Discharge Placement: Skilled nursing facility

## 2019-05-10 NOTE — Progress Notes (Signed)
TRANSFER - IN REPORT:    Verbal report received from Alli, RN on Cathy Jackson  being received from ER for routine progression of care      Report consisted of patient???s Situation, Background, Assessment and   Recommendations(SBAR).     Information from the following report(s) SBAR, Kardex and Recent Results was reviewed with the receiving nurse.    Opportunity for questions and clarification was provided.      Assessment completed upon patient???s arrival to unit and care assumed.

## 2019-05-10 NOTE — Anesthesia Procedure Notes (Signed)
Spinal Block    Start time: 05/10/2019 1:38 PM  End time: 05/10/2019 1:45 PM  Performed by: Durel Salts, MD  Authorized by: Bobbye Morton, MD     Pre-procedure:  Indications: at surgeon's request and primary anesthetic  Preanesthetic Checklist: patient identified, risks and benefits discussed, anesthesia consent, site marked, patient being monitored and timeout performed    Timeout Time: 13:37          Spinal Block:   Patient Position:  Seated  Prep Region:  Lumbar    Prep comment:  Choloraprep  Location:  L3-4  Technique:  Single shot        Needle:   Needle Type:  Quincke  Needle Gauge:  22 G  Attempts:  3 or more      Events: CSF confirmed, no blood with aspiration and no paresthesia        Assessment:  Insertion:  Uncomplicated  Patient tolerance:  Patient tolerated the procedure well with no immediate complications

## 2019-05-10 NOTE — ED Notes (Signed)
TRANSFER - OUT REPORT:    Verbal report given to Margaretha Sheffield on Cathy Jackson  being transferred to 5th floor for routine progression of care       Report consisted of patient???s Situation, Background, Assessment and   Recommendations(SBAR).     Information from the following report(s) SBAR, Kardex, Recent Results and Med Rec Status was reviewed with the receiving nurse.    Lines:   Peripheral IV 05/09/19 Right;Inner Forearm (Active)        Opportunity for questions and clarification was provided.      Patient transported with:   Tech        \\

## 2019-05-10 NOTE — Op Note (Signed)
Operative Report    Patient: Cathy Jackson MRN: [id number]  SSN: [id number]    Date of Birth: May 15, 1946  Age: 73 y.o.  Sex: female       Date of Surgery: 05/10/2019     History:  Whitnee Noblit is a 73 y.o. female who fell and injured her left hip.  She was seen in the emergency room and found to have a valgus impacted left femoral neck fracture.  We did talk to the patient and her family regarding the need for operative fixation of this and what this would involve.      I talked to the patient and/or their representative and explained the exact nature the procedure.  I also went through a detailed list of the material risks associated with  the procedure which included risk of bleeding, infection, injury to nearby structures, worsening the situation, as well as the risks associate with anesthesia and finally death.  Also talked with him regarding the benefits and alternatives to the procedure.    Preoperative Diagnosis: LEFT FEMORAL NECK FRACTURE     Postoperative Diagnosis: Closed displaced valgus impacted left femoral neck fracture    Surgeon(s) and Role:     * Laycie Schriner, Ted Mcalpine, MD - Primary    Anesthesia: Spinal     Procedure: Procedure(s):  Open treatment of left femoral neck fracture with plate and screw fixation    Procedure in Detail: After the successful induction of spinal anesthesia the left lower extremity was placed in boot traction on a fracture table.  At that point we made sure we could visualize the hip on both the AP and lateral projection with the C arm image intensifier.  Once this was confirmed we then prepped and draped the  hip.  After this was done I then used a guidewire to localize my incision and then made a small incision laterally over the left hip.  I placed a guidewire into the center the femoral head on both the AP and the lateral projection.  After placing the guidewire and confirmed its position I then measured for a 80 mm screw.   The proximal femur was then drilled corresponding to the length of the screw.  Once the screw and plate construct was assembled we then placed this with an impacter and confirmed its position on both AP and lateral projection.  I then used the outrigger device and drilled for and placed a single screw in the shaft portion of the plate.  Once this was in appropriate position I checked my final reduction as well as the placement of my hardware on both AP and lateral projection.  I was pleased with this.  I then remove the insertion device and closed my incision with 2.0 Monocryl for the subcutaneous tissue and staples for the skin.  Dressings were applied and the patient was awakened and taken to the recovery room in stable condition.          Estimated Blood Loss: 90 cc    Tourniquet Time: * No tourniquets in log *      Implants:   Implant Name Type Inv. Item Serial No. Manufacturer Lot No. LRB No. Used Action   PLATE FEM NECK SYS NS 1H STRL --  [id number]  PLATE FEM NECK SYS NS 1H STRL --   [employer] [place] [id number] Left 1 Implanted   BOLT FEM NECK SYS 80MM STRL --  - [id number]  BOLT FEM NECK SYS 80MM STRL --   [employer]  [place] [id number] Left 1 Implanted   SCR FEM NECK SYS 85MM STRL -- ANTIROTATION - [id number]  SCR FEM NECK SYS 85MM STRL -- ANTIROTATION  [employer] [place] [id number] Left 1 Implanted   SCR TI LCK STRDR 5X40MM T25 --  - [id number]  SCR TI LCK STRDR 5X40MM T25 --   [employer] [place] [id number] Left 1 Implanted               Specimens: * No specimens in log *        Drains: None                Complications: None    Counts: Sponge and needle counts were correct times two.    Signed By:  Kriste Basque, MD     May 10, 2019

## 2019-05-10 NOTE — Progress Notes (Signed)
Dr. Blima Ledger verified notified that dose of ancef has been given on the flooor and pt has meropenem due at 1300. Per Dr. Blima Ledger, no additional dose required.

## 2019-05-10 NOTE — Other (Signed)
Lab called for post-op CBC at this time

## 2019-05-10 NOTE — Progress Notes (Signed)
Report received from nightshift RN. Pt in bed resting at this time. Respirations even, lungs clear, pt on RA. Pt currently not speaking unable to assess orientation. IV clean dry and intact, infusing NS at 75. No signs of distress at this time. Safety measures in place. Will continue to monitor.

## 2019-05-10 NOTE — Progress Notes (Signed)
TRANSFER - OUT REPORT:    Verbal report given to Molly(name) on Cathy Jackson  being transferred to pre-op(unit) for ordered procedure       Report consisted of patient???s Situation, Background, Assessment and   Recommendations(SBAR).     Information from the following report(s) SBAR was reviewed with the receiving nurse.    Lines:   Peripheral IV 05/09/19 Right;Inner Forearm (Active)   Site Assessment Clean, dry, & intact 05/10/19 0418   Phlebitis Assessment 0 05/10/19 0418   Infiltration Assessment 0 05/10/19 0418   Dressing Status Clean, dry, & intact 05/10/19 0418   Dressing Type Transparent;Tape 05/10/19 0418   Hub Color/Line Status Patent 05/10/19 0418   Alcohol Cap Used No 05/10/19 0418        Opportunity for questions and clarification was provided.

## 2019-05-10 NOTE — ED Notes (Signed)
Changed patient's nephrostomy pouch, waiting for enough urine to collect specimen.

## 2019-05-10 NOTE — Progress Notes (Signed)
Hospitalist Progress Note    05/10/2019  Admit Date: 05/09/2019  5:26 PM   NAME: Cathy Jackson   DOB:  1946-08-18   MRN:  [id number]   Attending: Truitt Merle, DO  PCP:  Wilfred Lacy, NP    SUBJECTIVE:   73 y/o female with severe frontotemporal dementia, debility, bladder cancer, s/p ileal conduit, s/p nephro-ureteral tube placement 03/29/19, hypothyroid, GERD who suffered a fall today at a rehab facility. She was recently admitted 8/8-8/12 for ESBL Klebsiella UTI. Patient tried to stand but she could not. There was concern that she could have broken her hip. Xray does not show definite displaced fracture, but MRI reveals subcapital fracture of the left femoral neck. UA pos for UTI. Started on Merem given recent ESBL infection.     9/15 - seen prior to hip fracture repair. Remains aphasic. Not nodding yes/no for me but RN reports she was somewhat consistent with this earlier.       Review of Systems negative with exception of pertinent positives noted above  PHYSICAL EXAM     Visit Vitals  BP (!) 111/54   Pulse 100   Temp 97.6 ??F (36.4 ??C)   Resp 16   Ht 5\' 2"  (1.575 m)   Wt 49.9 kg (110 lb)   LMP  (LMP Unknown)   SpO2 96%   BMI 20.12 kg/m??      Temp (24hrs), Avg:98.1 ??F (36.7 ??C), Min:97.5 ??F (36.4 ??C), Max:100.2 ??F (37.9 ??C)    Oxygen Therapy  O2 Sat (%): 96 % (05/10/19 1520)  Pulse via Oximetry: 99 beats per minute (05/10/19 1520)  O2 Device: Nasal cannula (05/10/19 1510)  O2 Flow Rate (L/min): 3 l/min (05/10/19 1510)    Intake/Output Summary (Last 24 hours) at 05/10/2019 1653  Last data filed at 05/10/2019 1538  Gross per 24 hour   Intake 1400 ml   Output 800 ml   Net 600 ml      General: No acute distress????aphasic  Lungs:  CTA Bilaterally.   Heart:  Regular rate and rhythm,?? No murmur, rub, or gallop  Abdomen: Soft, Non distended, Non tender, Positive bowel sounds  Extremities: No cyanosis, clubbing or edema  Neurologic:?? No focal deficits    ASSESSMENT      Active Hospital Problems    Diagnosis Date Noted    ??? Closed left hip fracture ([place]) 05/09/2019   ??? Low grade fever 05/09/2019   ??? Dehydration 05/09/2019   ??? Acquired hypothyroidism 08/29/2015   ??? Essential hypertension 08/29/2015   ??? Depression with anxiety 02/01/2015     Last Assessment & Plan:   History of depression, anxiety, and agitation. Currently taking Celexa 20 mg- for about 3-4 years. Tolerating well and denies ADEs. Plan: Continue Celexa 20 mg daily.      ??? Frontotemporal dementia ([place]) 02/01/2015     Last Assessment & Plan:   Disinhibited, agitated, aggressive, violent at times. Will add depakote to try to calm her behaviors down. Might need to adjust namenda, aricept, and paxil, too but will start with depakote. Provided information about the drug, anticipated effects, and possible side effects. Family will call me next week and let me know how she is doing, we may need to increase the dose, but since she is small, we will need to monitor for fall risk.     Daughter inquired about PACE program. She isnt medicaid eligible, I dont think, but out of pocket might be an option for daytime care. She  is also going to look into senior day programs. I will refer her daughter to the alz association for additional resources. Might need to send daughter REACH at next appt, too.      ??? Asthma 10/27/2012   ??? GERD without esophagitis 10/27/2012   ??? Bladder cancer ([place]) 09/20/2012     A/p    - left hip fracture - now s/p surgical repair. Px mgmt and dvt prophy as per ortho    - UTI - with hx of ESBL. Follow for final cultures. Cont merem D2    - severe dementia - aware. Behavior stable. Cont home meds (trazadone/ paxil)        DVT Prophylaxis: asa 325mg  daily/ SCD's as per ortho    Signed By: Truitt Merle, DO     May 10, 2019

## 2019-05-10 NOTE — Progress Notes (Signed)
Patient lying in bed. NAD noted. Sleeping in bed. Receiving 3 L/min via NC. Respirations even and unlabored. Call light within reach. Bed in locked and low position. Report given to Earmon Phoenix, RN.

## 2019-05-10 NOTE — Interval H&P Note (Signed)
Update History & Physical    The Patient's History and Physical of  05/10/2019 was reviewed with the patient and I examined the patient.  There was no change.  The surgical site was confirmed by the patient and me.    Plan:  The risk, benefits, expected outcome, and alternative to the recommended procedure have been discussed with the patient.  Patient understands and wants to proceed with open treatment left femoral neck fracture.    Electronically signed by Kriste Basque, MD on 05/10/2019 at 12:17 PM

## 2019-05-11 LAB — CBC WITH AUTOMATED DIFF
ABS. BASOPHILS: 0 10*3/uL (ref 0.0–0.2)
ABS. EOSINOPHILS: 0.2 10*3/uL (ref 0.0–0.8)
ABS. IMM. GRANS.: 0 10*3/uL (ref 0.0–0.5)
ABS. LYMPHOCYTES: 1.2 10*3/uL (ref 0.5–4.6)
ABS. MONOCYTES: 1.2 10*3/uL (ref 0.1–1.3)
ABS. NEUTROPHILS: 3.4 10*3/uL (ref 1.7–8.2)
ABSOLUTE NRBC: 0 10*3/uL (ref 0.0–0.2)
BASOPHILS: 1 % (ref 0.0–2.0)
EOSINOPHILS: 3 % (ref 0.5–7.8)
HCT: 32 % — ABNORMAL LOW (ref 35.8–46.3)
HGB: 10 g/dL — ABNORMAL LOW (ref 11.7–15.4)
IMMATURE GRANULOCYTES: 1 % (ref 0.0–5.0)
LYMPHOCYTES: 20 % (ref 13–44)
MCH: 30.7 PG (ref 26.1–32.9)
MCHC: 31.3 g/dL — ABNORMAL LOW (ref 31.4–35.0)
MCV: 98.2 FL — ABNORMAL HIGH (ref 79.6–97.8)
MONOCYTES: 20 % — ABNORMAL HIGH (ref 4.0–12.0)
MPV: 10.3 FL (ref 9.4–12.3)
NEUTROPHILS: 56 % (ref 43–78)
PLATELET: 128 10*3/uL — ABNORMAL LOW (ref 150–450)
RBC: 3.26 M/uL — ABNORMAL LOW (ref 4.05–5.2)
RDW: 13.4 % (ref 11.9–14.6)
WBC: 6 10*3/uL (ref 4.3–11.1)

## 2019-05-11 LAB — METABOLIC PANEL, BASIC
Anion gap: 7 mmol/L (ref 7–16)
BUN: 15 MG/DL (ref 8–23)
CO2: 28 mmol/L (ref 21–32)
Calcium: 8.5 MG/DL (ref 8.3–10.4)
Chloride: 107 mmol/L (ref 98–107)
Creatinine: 0.6 MG/DL (ref 0.6–1.0)
GFR est AA: >60 mL/min/{1.73_m2} (ref >60)
GFR est non-AA: >60 mL/min/{1.73_m2} (ref >60)
Glucose: 98 mg/dL (ref 65–100)
Potassium: 3.5 mmol/L (ref 3.5–5.1)
Sodium: 142 mmol/L (ref 136–145)

## 2019-05-11 LAB — SARS-COV-2
COVID-19 rapid test: NOT DETECTED
SARS CoV-2: NEGATIVE

## 2019-05-11 LAB — PLEASE READ & DOCUMENT PPD TEST IN 24 HRS: mm Induration: 0 mm (ref 0–5)

## 2019-05-11 MED FILL — HYDROCODONE-ACETAMINOPHEN 5 MG-325 MG TAB: 5-325 mg | ORAL | Qty: 1

## 2019-05-11 MED FILL — MEROPENEM 500 MG IV SOLR: 500 mg | INTRAVENOUS | Qty: 500

## 2019-05-11 MED FILL — FERROUS SULFATE 325 MG (65 MG ELEMENTAL IRON) TAB: 325 mg (65 mg iron) | ORAL | Qty: 1

## 2019-05-11 MED FILL — MAPAP (ACETAMINOPHEN) 325 MG TABLET: 325 mg | ORAL | Qty: 2

## 2019-05-11 MED FILL — VITAMIN C 500 MG TABLET: 500 mg | ORAL | Qty: 2

## 2019-05-11 MED FILL — ATORVASTATIN 10 MG TAB: 10 mg | ORAL | Qty: 1

## 2019-05-11 MED FILL — OYSTER SHELL CALCIUM 500  500 MG CALCIUM (1,250 MG) TABLET: 500 mg calcium (1,250 mg) | ORAL | Qty: 1

## 2019-05-11 MED FILL — TRAZODONE 50 MG TAB: 50 mg | ORAL | Qty: 1

## 2019-05-11 MED FILL — PAROXETINE 20 MG TAB: 20 mg | ORAL | Qty: 1

## 2019-05-11 MED FILL — CALCIUM 500 + D 500 MG-5 MCG (200 UNIT) TABLET: 500 mg-5 mcg (200 unit) | ORAL | Qty: 1

## 2019-05-11 MED FILL — CEFAZOLIN 1 GRAM IV SOLUTION: 1 gram | INTRAVENOUS | Qty: 1000

## 2019-05-11 MED FILL — STOOL SOFTENER-STIMULANT LAXATIVE 8.6 MG-50 MG TABLET: ORAL | Qty: 1

## 2019-05-11 MED FILL — LIDOCAINE HCL 1 % (10 MG/ML) IJ SOLN: 10 mg/mL (1 %) | INTRAMUSCULAR | Qty: 0.1

## 2019-05-11 MED FILL — ASPIRIN 325 MG TAB: 325 mg | ORAL | Qty: 1

## 2019-05-11 NOTE — Progress Notes (Signed)
Pt restless in bed and states "my leg hurts" unable to rate pain scale and states it hurts./ Norco 5mg  1 tab po given for complaints of pain. Will monitor.

## 2019-05-11 NOTE — Progress Notes (Addendum)
Pt sitting up in bed. Pt alert oriented to person at this time. Pt very anxious and pulling at dressing to left hip. Dressing loose. Pt has urostomy to right lower abd, draining yellow urine.  pt answers some questions but doesn't answer all questions when asked. Pt has no acute distress noted at this time. Call light in reach, will monitor.

## 2019-05-11 NOTE — Progress Notes (Signed)
Problem: Mobility Impaired (Adult and Pediatric)  Goal: *Acute Goals and Plan of Care (Insert Text)  Outcome: Progressing Towards Goal  Note:   LTG:  (1.)Ms. Formisano will move from supine to sit and sit to supine , scoot up and down, and roll side to side with stand by assist within 7 treatment day(s).    (2.)Ms. Vessey will transfer from bed to chair and chair to bed with minimal assist using the least restrictive device within 7 treatment day(s).    (3.)Ms. Orban will ambulate with minimal assist for 20 feet with the least restrictive device within 7 treatment day(s).   (4.)Ms. Chaudoin will perform seated lower extremity exercises for 15+ minutes to improve strength and mobility within 7 days.  ________________________________________________________________________________________________     PHYSICAL THERAPY: Daily Note and PM 05/11/2019  INPATIENT: PT Visit Days : 1  Payor: AETNA MEDICARE / Plan: [facility] A[employer] MEDICARE ADVANTAGE / Product Type: Managed Care Medicare /       NAME/AGE/GENDER: Saiya Crist is a 73 y.o. female   PRIMARY DIAGNOSIS: Closed left hip fracture (HCC) [S72.002A]  Low grade fever [R50.9] Closed left hip fracture (HCC) Closed left hip fracture (HCC)  Procedure(s) (LRB):  LEFT FEMORAL NECK SYSTEM (Left)  1 Day Post-Op  ICD-10: Treatment Diagnosis:    ?? Generalized Muscle Weakness (M62.81)  ?? Difficulty in walking, Not elsewhere classified (R26.2)  ?? Other abnormalities of gait and mobility (R26.89)  ?? History of falling (Z91.81)   Precaution/Allergies:  Patient has no known allergies.      ASSESSMENT:     Ms. Prien is a 73 year old female with history of advanced frontotemporal dementia who is admitted from rehab facility with left hip fracture; seen s/p above surgery and is WBAT per ortho orders. She presents alert but confused, agreeable to therapy assessment and mobility. Reviewed WBAT status and plan of care. Min assist of 2 and additional time  needed for bed mobility/transfer to sitting. Fair seated balance noted. Min-brief mod assist of 2 for sit-stand transfer from edge of bed to walker with cues for mechanics/technique. Pt stood to walker, worked on Surveyor, mining and pre-gait activities at bedside, then ambulates 5 ft from bed to chair with min-mod assist of 2, heavy cues for technique, and some assistance needed to maneuver walker. Pt positioned comfortably in chair, unable to scoot back without assistance. Pt left with chair alarm on and OT providing further treatment. Marvelous Woolford is functioning well below baseline at this time with new deficits from left LE fracture/surgery- weakness and unsteady gait. She will benefit from continued acute PT to address deficits/maximize independence with mobility. Anticipate return to rehab facility at dc.  PM- pt presents sitting up in chair, seems a little more confused this afternoon and has more difficulty following commands during session. Min assist for sit-stand transfer from chair. Needs more time for all mobility this afternoon. Standing activities and ambulation 4 ft from bed to chair with mod assist/RW. Cueing for posture, gait mechanics, and sequencing. Fair seated balance; attempted lateral scooting however pt unable. Mod assist to return to supine, max-total assist for supine scooting. Positioned comfortably with bed alarm on, needs in reach, RN notified.    This section established at most recent assessment   PROBLEM LIST (Impairments causing functional limitations):  1. Decreased Strength  2. Decreased Transfer Abilities  3. Decreased Ambulation Ability/Technique  4. Decreased Balance  5. Increased Pain  6. Decreased Activity Tolerance  7. Decreased Flexibility/Joint Mobility  8. Decreased Skin Integrity/Hygeine  9. Decreased Cognition   INTERVENTIONS PLANNED: (Benefits and precautions of physical therapy have been discussed with the patient.)  1. Balance Exercise  2. Bed Mobility   3. Gait Training  4. Home Exercise Program (HEP)  5. Therapeutic Activites  6. Therapeutic Exercise/Strengthening  7. Transfer Training     TREATMENT PLAN: Frequency/Duration: twice daily for duration of hospital stay  Rehabilitation Potential For Stated Goals: Good     REHAB RECOMMENDATIONS (at time of discharge pending progress):    Placement:  It is my opinion, based on this patient's performance to date, that Ms. Mcferran may benefit from intensive therapy at a [facility] after discharge due to the functional deficits listed above that are likely to improve with skilled rehabilitation and concerns that he/she may be unsafe to be unsupervised at home due to weakness, fall risk, unsteady gait, need for heavy assistance with mobility/transfers .  Equipment:   ? Tbd at rehab              HISTORY:   History of Present Injury/Illness (Reason for Referral):  PER H&P, "Patient is a 73 y/o female with severe frontotemporal dementia, debility, bladder cancer, s/p ileal conduit, s/p nephro-ureteral tube placement 03/29/19, hypothyroid, GERD who suffered a fall today at a rehab facility. She was recently admitted 8/8-8/12 for ESBL Klebsiella UTI. Patient tried to stand but she could not. There was concern that she could have broken her hip. Xray does not show definite displaced fracture, but MRI reveals subcapital fracture of the left femoral neck. Bilateral edema like signal in the pubis bones may be a stress reaction or osteitis pubis. She also has a low grade temp of 100.2 without elevated WBC ct. Chest xray is clear. Her urine in bag is dark, concentrated and cloudy with sediment. UA with culture is pending. She will be admitted for further workup and treatment."    Past Medical History/Comorbidities:   Ms. Mcartor  has a past medical history of Abdominal pain, unspecified site (10/27/2012), Arthritis, Asthma (10/27/2012), Bladder cancer ([place]), Depression, Dysuria (10/27/2012), Environmental allergies, GERD  (gastroesophageal reflux disease) (10/27/2012), History of kidney stones, Hypothyroidism, Other "heavy-for-dates" infants, Unspecified disorder of bladder (10/27/2012), Urethral caruncle (10/27/2012), Urinary frequency (10/27/2012), and Urinary tract infection, site not specified (10/27/2012). She also has no past medical history of Difficult intubation, Malignant hyperthermia due to anesthesia, Nausea & vomiting, Pseudocholinesterase deficiency, or Unspecified adverse effect of anesthesia.  Ms. Brigance  has a past surgical history that includes hx breast biopsy (Left, 2000); hx cesarean section; hx tubal ligation; pr close cystostomy; hx wrist fracture tx (Left); hx cholecystectomy; hx nephrostomy (2014); ir exchange nephro perc rt si (10/23/2017); ir convert nephro perc rt to nephroureteral cath existing si (11/12/2017); ir change internal ureteral stent si (02/15/2018); ir change internal ureteral stent si (04/15/2018); ir change internal ureteral stent si (06/14/2018); ir change uret tube via ileal conduit (09/07/2018); ir change uret tube via ileal conduit (12/20/2018); ir change uret tube via ileal conduit (03/21/2019); and ir nephroureteral perc rt plc cath new access  si (03/29/2019).  Social History/Living Environment:      Prior Level of Function/Work/Activity:  From rehab. Advanced dementia. Unclear prior level of function at rehab facility.     Number of Personal Factors/Comorbidities that affect the Plan of Care: 3+: HIGH COMPLEXITY   EXAMINATION:   Most Recent Physical Functioning:   Gross Assessment:  AROM: Generally decreased, functional  Strength: Generally decreased, functional  Coordination: Generally decreased,  functional               Posture:  Posture (WDL): Exceptions to WDL  Posture Assessment: Forward head, Rounded shoulders, Trunk flexion  Balance:  Sitting: Impaired  Sitting - Static: Fair (occasional)  Sitting - Dynamic: Fair (occasional)  Standing: Impaired  Standing - Static: Fair   Standing - Dynamic : Poor Bed Mobility:  Supine to Sit: Minimum assistance;Assist x2  Sit to Supine: Moderate assistance  Scooting: Maximum assistance  Wheelchair Mobility:     Transfers:  Sit to Stand: Minimum assistance  Stand to Sit: Minimum assistance  Bed to Chair: Moderate assistance  Interventions: Verbal cues;Safety awareness training;Tactile cues  Duration: 17 Minutes  Gait:  Right Side Weight Bearing: Full  Left Side Weight Bearing: As tolerated  Base of Support: Narrowed;Center of gravity altered  Speed/Cadence: Pace decreased (<100 feet/min);Slow  Step Length: Left shortened;Right shortened  Gait Abnormalities: Trunk sway increased;Decreased step clearance;Step to gait;Antalgic  Distance (ft): 4 Feet (ft)  Assistive Device: Walker, rolling  Ambulation - Level of Assistance: Minimal assistance;Moderate assistance  Interventions: Verbal cues;Safety awareness training;Tactile cues      Body Structures Involved:  1. Eyes and Ears  2. Bones  3. Joints  4. Muscles Body Functions Affected:  1. Mental  2. Sensory/Pain  3. Neuromusculoskeletal  4. Movement Related Activities and Participation Affected:  1. General Tasks and Demands  2. Mobility  3. Domestic Life  4. Community, Social and [employer]   Number of elements that affect the Plan of Care: 4+: HIGH COMPLEXITY   CLINICAL PRESENTATION:   Presentation: Evolving clinical presentation with changing clinical characteristics: MODERATE COMPLEXITY   CLINICAL DECISION MAKING:   [place]??? ???6 Clicks???   Basic Mobility Inpatient Short Form  How much difficulty does the patient currently have... Unable A Lot A Little None   1.  Turning over in bed (including adjusting bedclothes, sheets and blankets)?   []  1   []  2   [x]  3   []  4   2.  Sitting down on and standing up from a chair with arms ( e.g., wheelchair, bedside commode, etc.)   []  1   [x]  2   []  3   []  4   3.  Moving from lying on back to sitting on the side of the bed?   []  1    []  2   [x]  3   []  4   How much help from another person does the patient currently need... Total A Lot A Little None   4.  Moving to and from a bed to a chair (including a wheelchair)?   []  1   [x]  2   []  3   []  4   5.  Need to walk in hospital room?   []  1   [x]  2   []  3   []  4   6.  Climbing 3-5 steps with a railing?   [x]  1   []  2   []  3   []  4   ?? 2007, Trustees of [employer], under license to [employer]. All rights reserved      Score:  Initial: 13 Most Recent: X (Date: -- )    Interpretation of Tool:  Represents activities that are increasingly more difficult (i.e. Bed mobility, Transfers, Gait).    Medical Necessity:     ?? Patient demonstrates   ?? fair  ??  rehab potential due to higher previous functional level.  Reason  for Services/Other Comments:  ?? Patient   ?? continues to demonstrate capacity to improve strength, mobility, balance, transfers, and activity tolerance which will   ?? increase independence, decrease amount of assistance required from caregiver, and increase safety  ?? .   Use of outcome tool(s) and clinical judgement create a POC that gives a: Questionable prediction of patient's progress: MODERATE COMPLEXITY            TREATMENT:   (In addition to Assessment/Re-Assessment sessions the following treatments were rendered)   Pre-treatment Symptoms/Complaints:  pt still alert, not as interactive this afternoon  Pain: Initial:   Pain Intensity 1: 0  Post Session:  0/10     Therapeutic Activity: (  17 Minutes ):  Therapeutic activities including Bed transfers, Chair transfers, standing balance, and Ambulation on level ground to improve mobility, strength, balance, coordination, and activity tolerance .  Required minimal to moderate assist and heavy Verbal cues;Safety awareness training;Tactile cues to promote static and dynamic balance in standing and promote motor control of bilateral, lower extremity(s).     Braces/Orthotics/Lines/Etc:   ?? IV  ?? O2 Device: Room air   Treatment/Session Assessment:    ?? Response to Treatment:  pt performs mobility with min-briefly mod assist for transfers/ambulation  ?? Interdisciplinary Collaboration:   o Physical Therapist  o Registered Nurse  ?? After treatment position/precautions:   o Supine in bed  o Bed alarm/tab alert on  o Bed/Chair-wheels locked  o Bed in low position  o Call light within reach  o RN notified   ?? Compliance with Program/Exercises: Will assess as treatment progresses  ?? Recommendations/Intent for next treatment session:  "Next visit will focus on advancements to more challenging activities and reduction in assistance provided".  Total Treatment Duration:  PT Patient Time In/Time Out  Time In: 1315  Time Out: [place], DPT

## 2019-05-11 NOTE — Progress Notes (Signed)
May 11, 2019         Post Op day: 1 Day Post-Op Procedure(s) (LRB):  LEFT FEMORAL NECK SYSTEM (Left)      Admit Date: 05/09/2019  Admit Diagnosis: Closed left hip fracture (HCC) [S72.002A]  Low grade fever [R50.9]       Principle Problem: Closed left hip fracture ([place]).           Subjective: Underlying dementia, patient has some left hip pain, No SOB, No Chest Pain, No Nausea or Vomiting     Objective:   Vital Signs are Stable, No Acute Distress, Alert,  Dressing is Dry but lifting up,  Neurovascular exam is normal.     Assessment / Plan :  Patient Active Problem List   Diagnosis Code   ??? Bladder cancer ([place]) C67.9   ??? Urethral caruncle N36.2   ??? Unspecified disorder of bladder N32.9   ??? Asthma J45.909   ??? GERD without esophagitis K21.9   ??? Acquired hypothyroidism E03.9   ??? Essential hypertension I10   ??? Primary osteoarthritis involving multiple joints M89.49   ??? Dysthymia F34.1   ??? Mixed hyperlipidemia E78.2   ??? Perennial allergic rhinitis J30.89   ??? Frontotemporal dementia (HCC) G31.09, F02.80   ??? Allergic rhinitis J30.9   ??? Iron deficiency anemia D50.9   ??? Depression with anxiety F41.8   ??? Hypotension I95.9   ??? AKI (acute kidney injury) ([place]) N17.9   ??? Acute metabolic encephalopathy [id number]   ??? Urinary tract infection due to ESBL Klebsiella N39.0, B96.89   ??? Lactic acidosis E87.2   ??? Hypoalbuminemia E88.09   ??? Severe sepsis with acute organ dysfunction (HCC) A41.9, R65.20   ??? Closed left hip fracture (HCC) S72.002A   ??? Low grade fever R50.9   ??? Dehydration E86.0      Patient Vitals for the past 8 hrs:   BP Temp Pulse Resp SpO2   05/11/19 0802 (!) 93/52 97.8 ??F (36.6 ??C) 93 17 91 %   05/11/19 0312 116/71 98.1 ??F (36.7 ??C) 92 18 95 %   05/11/19 0301 116/64 98 ??F (36.7 ??C) 93 18 98 %    Temp (24hrs), Avg:98.1 ??F (36.7 ??C), Min:97.5 ??F (36.4 ??C), Max:99.9 ??F (37.7 ??C)    Body mass index is 20.12 kg/m??.    Lab Results   Component Value Date/Time    HGB 10.0 (L) 05/11/2019 05:00 AM         S/P left hip femoral neck system: dressing change today. Minimize narcotics   Medical Mgmt per hospitalist  Anticoagulation plan: ASA 325mg  daily   Continue PT  Fall Precautions  DC disp: rehab placement        Signed By: Kandy Garrison, PA  05/11/2019,  8:31 AM

## 2019-05-11 NOTE — Progress Notes (Signed)
Norco 5 mg 1 tab po given for FLACC scale of 4. Will monitor.

## 2019-05-11 NOTE — Progress Notes (Signed)
Patient accepted and selected to [facility].  PPD in process and Covid is negative as of 9:15 PM on 9/15.  Insurance approval has been initiated by facility and they anticipate it may take at least 48 hours.  Daughter, Danelle Berry, called and notified of discharge plan.  She is agreeable and states understanding of the process.    CM will continue to follow for discharge planning.

## 2019-05-11 NOTE — Progress Notes (Signed)
Hospitalist Progress Note    05/11/2019  Admit Date: 05/09/2019  5:26 PM   NAME: Cathy Jackson   DOB:  1946-02-29   MRN:  [id number]   Attending: Truitt Merle, DO  PCP:  Wilfred Lacy, NP    SUBJECTIVE:   73 y/o female with severe frontotemporal dementia, debility, bladder cancer, s/p ileal conduit, s/p nephro-ureteral tube placement 03/29/19, hypothyroid, GERD who suffered a fall today at a rehab facility. She was recently admitted 8/8-8/12 for ESBL Klebsiella UTI. Patient tried to stand but she could not. There was concern that she could have broken her hip. Xray does not show definite displaced fracture, but MRI reveals subcapital fracture of the left femoral neck. UA pos for UTI. Started on Merem given recent ESBL infection.     Patient is s/p hip fracture repair, POD 1. Overall doing better. She is not very responsive. Following commands but not vocal. Tolerating diet. Physical therapy on board.       Review of Systems negative with exception of pertinent positives noted above  PHYSICAL EXAM     Visit Vitals  BP 111/71 (BP 1 Location: Right arm, BP Patient Position: At rest)   Pulse 84   Temp 97.8 ??F (36.6 ??C)   Resp 17   Ht 5\' 2"  (1.575 m)   Wt 49.9 kg (110 lb)   LMP  (LMP Unknown)   SpO2 96%   BMI 20.12 kg/m??      Temp (24hrs), Avg:98.1 ??F (36.7 ??C), Min:97.6 ??F (36.4 ??C), Max:99.9 ??F (37.7 ??C)    Oxygen Therapy  O2 Sat (%): 96 % (05/11/19 1514)  Pulse via Oximetry: 99 beats per minute (05/10/19 1520)  O2 Device: Room air (05/11/19 1332)  O2 Flow Rate (L/min): 3 l/min (05/10/19 1510)    Intake/Output Summary (Last 24 hours) at 05/11/2019 1759  Last data filed at 05/11/2019 0640  Gross per 24 hour   Intake 1023 ml   Output 1050 ml   Net -27 ml      General: No acute distress????aphasic  Lungs:  CTA Bilaterally.   Heart:  Regular rate and rhythm,?? No murmur, rub, or gallop  Abdomen: Soft, Non distended, Non tender, Positive bowel sounds  Extremities: No cyanosis, clubbing or edema   Neurologic:?? No focal deficits    ASSESSMENT      Active Hospital Problems    Diagnosis Date Noted   ??? Closed left hip fracture ([place]) 05/09/2019   ??? Low grade fever 05/09/2019   ??? Dehydration 05/09/2019   ??? Acquired hypothyroidism 08/29/2015   ??? Essential hypertension 08/29/2015   ??? Depression with anxiety 02/01/2015     Last Assessment & Plan:   History of depression, anxiety, and agitation. Currently taking Celexa 20 mg- for about 3-4 years. Tolerating well and denies ADEs. Plan: Continue Celexa 20 mg daily.      ??? Frontotemporal dementia ([place]) 02/01/2015     Last Assessment & Plan:   Disinhibited, agitated, aggressive, violent at times. Will add depakote to try to calm her behaviors down. Might need to adjust namenda, aricept, and paxil, too but will start with depakote. Provided information about the drug, anticipated effects, and possible side effects. Family will call me next week and let me know how she is doing, we may need to increase the dose, but since she is small, we will need to monitor for fall risk.     Daughter inquired about PACE program. She isnt medicaid eligible, I dont think, but  out of pocket might be an option for daytime care. She is also going to look into senior day programs. I will refer her daughter to the [employer] [employer] for additional resources. Might need to send daughter REACH at next appt, too.      ??? Asthma 10/27/2012   ??? GERD without esophagitis 10/27/2012   ??? Bladder cancer ([place]) 09/20/2012     A/p    Left hip fracture s/p surgical repair, POD 1  Px mgmt and dvt prophy as per ortho  Physical therapy     UTI - with hx of ESBL:    Follow for final cultures. Cont merem D3    Severe dementia - aware:  Behavior stable. Cont home meds (trazadone/ paxil)    DVT Prophylaxis: asa 325mg  daily/ SCD's as per ortho    Signed By: Malachi Pro, MD     May 11, 2019

## 2019-05-11 NOTE — Progress Notes (Signed)
Problem: Mobility Impaired (Adult and Pediatric)  Goal: *Acute Goals and Plan of Care (Insert Text)  Outcome: Progressing Towards Goal  Note:   LTG:  (1.)Cathy Jackson will move from supine to sit and sit to supine , scoot up and down, and roll side to side with stand by assist within 7 treatment day(s).    (2.)Cathy Jackson will transfer from bed to chair and chair to bed with minimal assist using the least restrictive device within 7 treatment day(s).    (3.)Cathy Jackson will ambulate with minimal assist for 20 feet with the least restrictive device within 7 treatment day(s).   (4.)Cathy Jackson will perform seated lower extremity exercises for 15+ minutes to improve strength and mobility within 7 days.  ________________________________________________________________________________________________     PHYSICAL THERAPY: Initial Assessment and AM 05/11/2019  INPATIENT: PT Visit Days : 1  Payor: AETNA MEDICARE / Plan: [facility] AETNA MEDICARE ADVANTAGE / Product Type: Managed Care Medicare /       NAME/AGE/GENDER: Cathy Jackson is a 73 y.o. female   PRIMARY DIAGNOSIS: Closed left hip fracture (HCC) [S72.002A]  Low grade fever [R50.9] Closed left hip fracture (HCC) Closed left hip fracture (HCC)  Procedure(s) (LRB):  LEFT FEMORAL NECK SYSTEM (Left)  1 Day Post-Op  ICD-10: Treatment Diagnosis:    Generalized Muscle Weakness (M62.81)  Difficulty in walking, Not elsewhere classified (R26.2)  Other abnormalities of gait and mobility (R26.89)  History of falling (Z91.81)   Precaution/Allergies:  Patient has no known allergies.      ASSESSMENT:     Cathy Jackson is a 73 year old female with history of advanced frontotemporal dementia who is admitted from rehab facility with left hip fracture; seen s/p above surgery and is WBAT per ortho orders. She presents alert but confused, agreeable to therapy assessment and mobility. Reviewed WBAT status and plan of care. Min assist of 2 and additional time  needed for bed mobility/transfer to sitting. Fair seated balance noted. Min-brief mod assist of 2 for sit-stand transfer from edge of bed to walker with cues for mechanics/technique. Pt stood to walker, worked on Surveyor, mining and pre-gait activities at bedside, then ambulates 5 ft from bed to chair with min-mod assist of 2, heavy cues for technique, and some assistance needed to maneuver walker. Pt positioned comfortably in chair, unable to scoot back without assistance. Pt left with chair alarm on and OT providing further treatment. Cathy Jackson is functioning well below baseline at this time with new deficits from left LE fracture/surgery- weakness and unsteady gait. She will benefit from continued acute PT to address deficits/maximize independence with mobility. Anticipate return to rehab facility at dc.    This section established at most recent assessment   PROBLEM LIST (Impairments causing functional limitations):  Decreased Strength  Decreased Transfer Abilities  Decreased Ambulation Ability/Technique  Decreased Balance  Increased Pain  Decreased Activity Tolerance  Decreased Flexibility/Joint Mobility  Decreased Skin Integrity/Hygeine  Decreased Cognition   INTERVENTIONS PLANNED: (Benefits and precautions of physical therapy have been discussed with the patient.)  Balance Exercise  Bed Mobility  Gait Training  Home Exercise Program (HEP)  Therapeutic Activites  Therapeutic Exercise/Strengthening  Transfer Training     TREATMENT PLAN: Frequency/Duration: twice daily for duration of hospital stay  Rehabilitation Potential For Stated Goals: Good     REHAB RECOMMENDATIONS (at time of discharge pending progress):    Placement:  It is my opinion, based on this patient's performance to date, that Cathy Jackson may benefit  from intensive therapy at a [facility] after discharge due to the functional deficits listed above that are  likely to improve with skilled rehabilitation and concerns that he/she may be unsafe to be unsupervised at home due to weakness, fall risk, unsteady gait, need for heavy assistance with mobility/transfers .  Equipment:   Tbd at rehab              HISTORY:   History of Present Injury/Illness (Reason for Referral):  PER H&P, "Patient is a 73 y/o female with severe frontotemporal dementia, debility, bladder cancer, s/p ileal conduit, s/p nephro-ureteral tube placement 03/29/19, hypothyroid, GERD who suffered a fall today at a rehab facility. She was recently admitted 8/8-8/12 for ESBL Klebsiella UTI. Patient tried to stand but she could not. There was concern that she could have broken her hip. Xray does not show definite displaced fracture, but MRI reveals subcapital fracture of the left femoral neck. Bilateral edema like signal in the pubis bones may be a stress reaction or osteitis pubis. She also has a low grade temp of 100.2 without elevated WBC ct. Chest xray is clear. Her urine in bag is dark, concentrated and cloudy with sediment. UA with culture is pending. She will be admitted for further workup and treatment."    Past Medical History/Comorbidities:   Cathy Jackson  has a past medical history of Abdominal pain, unspecified site (10/27/2012), Arthritis, Asthma (10/27/2012), Bladder cancer ([place]), Depression, Dysuria (10/27/2012), Environmental allergies, GERD (gastroesophageal reflux disease) (10/27/2012), History of kidney stones, Hypothyroidism, Other "heavy-for-dates" infants, Unspecified disorder of bladder (10/27/2012), Urethral caruncle (10/27/2012), Urinary frequency (10/27/2012), and Urinary tract infection, site not specified (10/27/2012). She also has no past medical history of Difficult intubation, Malignant hyperthermia due to anesthesia, Nausea & vomiting, Pseudocholinesterase deficiency, or Unspecified adverse effect of anesthesia.  Cathy Jackson   has a past surgical history that includes hx breast biopsy (Left, 2000); hx cesarean section; hx tubal ligation; pr close cystostomy; hx wrist fracture tx (Left); hx cholecystectomy; hx nephrostomy (2014); ir exchange nephro perc rt si (10/23/2017); ir convert nephro perc rt to nephroureteral cath existing si (11/12/2017); ir change internal ureteral stent si (02/15/2018); ir change internal ureteral stent si (04/15/2018); ir change internal ureteral stent si (06/14/2018); ir change uret tube via ileal conduit (09/07/2018); ir change uret tube via ileal conduit (12/20/2018); ir change uret tube via ileal conduit (03/21/2019); and ir nephroureteral perc rt plc cath new access  si (03/29/2019).  Social History/Living Environment:      Prior Level of Function/Work/Activity:  From rehab. Advanced dementia. Unclear prior level of function at rehab facility.     Number of Personal Factors/Comorbidities that affect the Plan of Care: 3+: HIGH COMPLEXITY   EXAMINATION:   Most Recent Physical Functioning:   Gross Assessment:  AROM: Generally decreased, functional  Strength: Generally decreased, functional  Coordination: Generally decreased, functional               Posture:  Posture (WDL): Exceptions to WDL  Posture Assessment: Forward head, Rounded shoulders, Trunk flexion  Balance:  Sitting: Impaired  Sitting - Static: Fair (occasional)  Sitting - Dynamic: Fair (occasional)  Standing: Impaired  Standing - Static: Fair  Standing - Dynamic : Poor Bed Mobility:  Supine to Sit: Minimum assistance;Assist x2  Scooting: Moderate assistance;Maximum assistance  Wheelchair Mobility:     Transfers:  Sit to Stand: Minimum assistance;Moderate assistance;Assist x2  Stand to Sit: Minimum assistance  Bed to Chair: Minimum assistance;Moderate assistance;Assist x2  Interventions: Verbal  cues;Safety awareness training;Tactile cues  Duration: 9 Minutes  Gait:  Right Side Weight Bearing: Full  Left Side Weight Bearing: As tolerated   Base of Support: Narrowed;Center of gravity altered  Speed/Cadence: Pace decreased (<100 feet/min);Slow  Step Length: Left shortened;Right shortened  Gait Abnormalities: Antalgic;Step to gait  Distance (ft): 4 Feet (ft)  Assistive Device: Walker, rolling  Ambulation - Level of Assistance: Minimal assistance;Moderate assistance;Assist x2  Interventions: Verbal cues;Safety awareness training;Tactile cues;Manual cues      Body Structures Involved:  Eyes and Ears  Bones  Joints  Muscles Body Functions Affected:  Mental  Sensory/Pain  Neuromusculoskeletal  Movement Related Activities and Participation Affected:  General Tasks and Demands  Mobility  Domestic Life  Community, Social and [place]   Number of elements that affect the Plan of Care: 4+: HIGH COMPLEXITY   CLINICAL PRESENTATION:   Presentation: Evolving clinical presentation with changing clinical characteristics: MODERATE COMPLEXITY   CLINICAL DECISION MAKING:   [facility] Home	[place] AM-PAC??? ???6 Clicks???   Basic Mobility Inpatient Short Form  How much difficulty does the patient currently have... Unable A Lot A Little None   1.  Turning over in bed (including adjusting bedclothes, sheets and blankets)?   []  1   []  2   [x]  3   []  4   2.  Sitting down on and standing up from a chair with arms ( e.g., wheelchair, bedside commode, etc.)   []  1   [x]  2   []  3   []  4   3.  Moving from lying on back to sitting on the side of the bed?   []  1   []  2   [x]  3   []  4   How much help from another person does the patient currently need... Total A Lot A Little None   4.  Moving to and from a bed to a chair (including a wheelchair)?   []  1   [x]  2   []  3   []  4   5.  Need to walk in hospital room?   []  1   [x]  2   []  3   []  4   6.  Climbing 3-5 steps with a railing?   [x]  1   []  2   []  3   []  4   ?? 2007, [employer], under license to [employer]. All rights reserved      Score:  Initial: 13 Most Recent: X (Date: -- )     Interpretation of Tool:  Represents activities that are increasingly more difficult (i.e. Bed mobility, Transfers, Gait).    Medical Necessity:     Patient demonstrates   fair   rehab potential due to higher previous functional level.  Reason for Services/Other Comments:  Patient   continues to demonstrate capacity to improve strength, mobility, balance, transfers, and activity tolerance which will   increase independence, decrease amount of assistance required from caregiver, and increase safety  .   Use of outcome tool(s) and clinical judgement create a POC that gives a: Questionable prediction of patient's progress: MODERATE COMPLEXITY            TREATMENT:   (In addition to Assessment/Re-Assessment sessions the following treatments were rendered)   Pre-treatment Symptoms/Complaints:  "ok"  Pain: Initial:      Post Session:  0/10     Therapeutic Activity: (  9 Minutes ):  Therapeutic activities including Bed transfers, Chair transfers, and Ambulation on level ground to  improve mobility, strength, balance, coordination, and activity tolerance .  Required minimal to moderate assist of 1-2 and heavy Verbal cues;Safety awareness training;Tactile cues;Manual cues to promote static and dynamic balance in standing and promote motor control of bilateral, lower extremity(s).     Braces/Orthotics/Lines/Etc:   IV  O2 Device: Room air  Treatment/Session Assessment:    Response to Treatment:  pt performs mobility with CGA/min A  Interdisciplinary Collaboration:   Physical Therapist  Occupational Therapist  Registered Nurse  After treatment position/precautions:   Up in chair  Bed alarm/tab alert on  Bed/Chair-wheels locked  Bed in low position  Call light within reach  RN notified  OT at bedside    Compliance with Program/Exercises: Will assess as treatment progresses  Recommendations/Intent for next treatment session:  "Next visit will focus on advancements to more challenging activities and reduction in assistance  provided".  Total Treatment Duration:  PT Patient Time In/Time Out  Time In: 0900  Time Out: [place], DPT

## 2019-05-11 NOTE — Addendum Note (Signed)
Addendum  created 05/11/19 0727 by Judie Bonus, CRNA    Intraprocedure Flowsheets edited

## 2019-05-11 NOTE — Progress Notes (Signed)
Report attempted by this RN at [place] [facility] [place]. Gave secretary my extension to call back when report can be taken.

## 2019-05-11 NOTE — Progress Notes (Signed)
Pt resting in bed. Pt more relaxed but remains confused. No distress noted at this time. Call light in reach, will monitor. Dressing to left hip clean, dry, intact at this time. Will monitor.

## 2019-05-11 NOTE — Progress Notes (Signed)
Pt sitting up in chair. Pt more comfort no s/sx of pain at this time. Chair alarm and lap belt in place.

## 2019-05-11 NOTE — Progress Notes (Signed)
Dressing replaced to left hip. Will monitor.

## 2019-05-11 NOTE — Progress Notes (Signed)
Problem: Self Care Deficits Care Plan (Adult)  Goal: *Acute Goals and Plan of Care (Insert Text)  Outcome: Progressing Towards Goal  Note: 1. Pt will toilet with mod A   2. Pt will complete functional mobility for ADLs with min A  3. Pt will complete lower body dressing with mod A using AE as needed  4. Pt will complete grooming and hygiene with set up  5. Pt will complete HEP with min cues to promote increased BUE strength and functional use for ADLs  6. Pt will maintain standing balance for ADLs with CGA  7. Pt will complete bed mobility with CGA in prep for ADLs    Timeframe: 7 days       OCCUPATIONAL THERAPY: Initial Assessment and Daily Note 05/11/2019  INPATIENT: OT Visit Days: 1  Payor: AETNA MEDICARE / Plan: [facility] AETNA MEDICARE ADVANTAGE / Product Type: Managed Care Medicare /      NAME/AGE/GENDER: Cathy Jackson is a 73 y.o. female   PRIMARY DIAGNOSIS:  Closed left hip fracture (HCC) [S72.002A]  Low grade fever [R50.9] Closed left hip fracture (HCC) Closed left hip fracture (HCC)  Procedure(s) (LRB):  LEFT FEMORAL NECK SYSTEM (Left)  1 Day Post-Op  ICD-10: Treatment Diagnosis:    ?? History of falling (Z91.81)   Precautions/Allergies:    WBAT Patient has no known allergies.      ASSESSMENT:     Ms. Turnier presents with L hip fx d/t falling at rehab, WBAT. Pt presented A&O to person only, minimally verbal, unable to provide detailed PLOF information but did indicate that she required some assistance for bathing, dressing, and toileting. Pt presented with deficits in mobility, balance, endurance, strength, and cognition impacting ADLs. Pt evaluated with PT to maintain safety and maximize function d/t physical and cognitive deficits. Pt completed bed mobility with min X2, stood and transferred to the chair with min-mod X2 using RW. Total A to don socks, min A to brush her teeth and set up to wash face once seated in chair. Pt would benefit from skilled OT services to address deficits. Recommend d/c  to SNF.     This section established at most recent assessment   PROBLEM LIST (Impairments causing functional limitations):  1. Decreased Strength  2. Decreased ADL/Functional Activities  3. Decreased Transfer Abilities  4. Decreased Balance  5. Decreased Cognition   INTERVENTIONS PLANNED: (Benefits and precautions of occupational therapy have been discussed with the patient.)  1. Activities of daily living training  2. Adaptive equipment training  3. Balance training  4. Therapeutic activity  5. Therapeutic exercise     TREATMENT PLAN: Frequency/Duration: Follow patient 3 times/ week to address above goals.  Rehabilitation Potential For Stated Goals: [place] (at time of discharge pending progress):    Placement:  It is my opinion, based on this patient's performance to date, that Ms. Kobes may benefit from intensive therapy at a [facility] after discharge due to the functional deficits listed above that are likely to improve with skilled rehabilitation and concerns that he/she may be unsafe to be unsupervised at home due to fall risk, inability to mobilize .  Equipment:   ? None at this time              OCCUPATIONAL PROFILE AND HISTORY:   History of Present Injury/Illness (Reason for Referral):  See H&P  Past Medical History/Comorbidities:   Ms. Donica  has a past medical history of Abdominal  pain, unspecified site (10/27/2012), Arthritis, Asthma (10/27/2012), Bladder cancer ([place]), Depression, Dysuria (10/27/2012), Environmental allergies, GERD (gastroesophageal reflux disease) (10/27/2012), History of kidney stones, Hypothyroidism, Other "heavy-for-dates" infants, Unspecified disorder of bladder (10/27/2012), Urethral caruncle (10/27/2012), Urinary frequency (10/27/2012), and Urinary tract infection, site not specified (10/27/2012). She also has no past medical history of Difficult intubation, Malignant hyperthermia due to anesthesia, Nausea & vomiting, Pseudocholinesterase  deficiency, or Unspecified adverse effect of anesthesia.  Ms. Hertzler  has a past surgical history that includes hx breast biopsy (Left, 2000); hx cesarean section; hx tubal ligation; pr close cystostomy; hx wrist fracture tx (Left); hx cholecystectomy; hx nephrostomy (2014); ir exchange nephro perc rt si (10/23/2017); ir convert nephro perc rt to nephroureteral cath existing si (11/12/2017); ir change internal ureteral stent si (02/15/2018); ir change internal ureteral stent si (04/15/2018); ir change internal ureteral stent si (06/14/2018); ir change uret tube via ileal conduit (09/07/2018); ir change uret tube via ileal conduit (12/20/2018); ir change uret tube via ileal conduit (03/21/2019); and ir nephroureteral perc rt plc cath new access  si (03/29/2019).  Social History/Living Environment:    unknown  Prior Level of Function/Work/Activity:  See assessment      Number of Personal Factors/Comorbidities that affect the Plan of Care: Expanded review of therapy/medical records (1-2):  MODERATE COMPLEXITY   ASSESSMENT OF OCCUPATIONAL PERFORMANCE::   Activities of Daily Living:   Basic ADLs (From Assessment) Complex ADLs (From Assessment)   Feeding: Setup  Oral Facial Hygiene/Grooming: Minimum assistance  Bathing: Maximum assistance  Upper Body Dressing: Moderate assistance  Lower Body Dressing: Total assistance  Toileting: Maximum assistance, Total assistance Instrumental ADL  Meal Preparation: Total assistance  Homemaking: Total assistance   Grooming/Bathing/Dressing Activities of Daily Living   Grooming  Grooming Assistance: Contact guard assistance;Minimum assistance  Position Performed: Seated in chair                       Lower Body Dressing Assistance  Socks: Total assistance (dependent) Bed/Mat Mobility  Supine to Sit: Minimum assistance;Assist x2  Sit to Stand: Minimum assistance;Moderate assistance;Assist x2  Stand to Sit: Minimum assistance;Assist x2   Bed to Chair: Minimum assistance;Moderate assistance;Assist x2  Scooting: Maximum assistance     Most Recent Physical Functioning:   Gross Assessment:  AROM: Generally decreased, functional  Strength: Generally decreased, functional               Posture:     Balance:  Sitting: Impaired  Sitting - Static: Fair (occasional)  Sitting - Dynamic: Fair (occasional)  Standing: Impaired  Standing - Static: Fair  Standing - Dynamic : Poor Bed Mobility:  Supine to Sit: Minimum assistance;Assist x2  Scooting: Maximum assistance  Wheelchair Mobility:     Transfers:  Sit to Stand: Minimum assistance;Moderate assistance;Assist x2  Stand to Sit: Minimum assistance;Assist x2  Bed to Chair: Minimum assistance;Moderate assistance;Assist x2            Patient Vitals for the past 6 hrs:   BP BP Patient Position SpO2 Pulse   05/11/19 0802 (!) 93/52 At rest 91 % 93       Mental Status  Neurologic State: Alert, Confused  Orientation Level: Oriented to person  Cognition: Memory loss, Follows commands, Decreased command following                          Physical Skills Involved:  1. Balance  2. Strength  3. Activity Tolerance Cognitive Skills  Affected (resulting in the inability to perform in a timely and safe manner):  1. Executive Function  2. Immediate Memory  3. Short Term Recall  4. Long Term Memory  5. Sustained Attention  6. Divided Attention  7. Comprehension  8. Expression Psychosocial Skills Affected:  1. Habits/Routines   Number of elements that affect the Plan of Care: 5+:  HIGH COMPLEXITY   CLINICAL DECISION MAKING:   [place] AM-PAC??? ???6 Clicks???   Daily Activity Inpatient Short Form  How much help from another person does the patient currently need... Total A Lot A Little None   1.  Putting on and taking off regular lower body clothing?   [x]  1   []  2   []  3   []  4   2.  Bathing (including washing, rinsing, drying)?   []  1   [x]  2   []  3   []  4    3.  Toileting, which includes using toilet, bedpan or urinal?   []  1   [x]  2   []  3   []  4   4.  Putting on and taking off regular upper body clothing?   []  1   []  2   [x]  3   []  4   5.  Taking care of personal grooming such as brushing teeth?   []  1   []  2   [x]  3   []  4   6.  Eating meals?   []  1   []  2   [x]  3   []  4   ?? 2007, [employer], under license to [employer]. All rights reserved      Score:  Initial: 14 Most Recent: X (Date: -- )    Interpretation of Tool:  Represents activities that are increasingly more difficult (i.e. Bed mobility, Transfers, Gait).    Medical Necessity:     ?? Patient is expected to demonstrate progress in strength, balance and functional technique to decrease assistance required with ADLs.  Reason for Services/Other Comments:  ?? Patient continues to require present interventions due to patient's inability to complete ADLs.   Use of outcome tool(s) and clinical judgement create a POC that gives a: MODERATE COMPLEXITY         TREATMENT:   (In addition to Assessment/Re-Assessment sessions the following treatments were rendered)     Pre-treatment Symptoms/Complaints:    Pain: Initial:   Pain Intensity 1: 0  Post Session:  0     Self Care: (8 min): Procedure(s) (per grid) utilized to improve and/or restore self-care/home management as related to dressing and grooming. Required moderate   cueing to facilitate activities of daily living skills and compensatory activities.    Braces/Orthotics/Lines/Etc:   ?? O2 Device: Nasal cannula  Treatment/Session Assessment:    ?? Response to Treatment:  No adverse reaction   ?? Interdisciplinary Collaboration:   o Physical Therapist  o Occupational Therapist  o Registered Nurse  ?? After treatment position/precautions:   o Up in chair  o Bed/Chair-wheels locked  o Call light within reach  o RN notified   ?? Compliance with Program/Exercises: Will assess as treatment progresses.   ?? Recommendations/Intent for next treatment session:  "Next visit will focus on advancements to more challenging activities and reduction in assistance provided".  Total Treatment Duration:  OT Patient Time In/Time Out  Time In: 0905  Time Out: [place], OT

## 2019-05-11 NOTE — Progress Notes (Signed)
Pt resting in bed. Pt confused and doesn't answer all questions when asked. No distress noted at this time. Call light in reach, door open will monitor.

## 2019-05-11 NOTE — Progress Notes (Signed)
TRANSFER - OUT REPORT:    Verbal report given to Ashley(name) on [name]  being transferred to 735(unit) for routine progression of care       Report consisted of patient???s Situation, Background, Assessment and   Recommendations(SBAR).     Information from the following report(s) SBAR, Kard[place], OR Summary, MAR and Recent Results was reviewed with the receiving nurse.    Lines:   Peripheral IV 05/09/19 Right;Inner Forearm (Active)   Site Assessment Clean, dry, & intact 05/10/19 2008   Phlebitis Assessment 0 05/10/19 2008   Infiltration Assessment 0 05/10/19 2008   Dressing Status Clean, dry, & intact 05/10/19 2008   Dressing Type Tape;Transparent 05/10/19 2008   Hub Color/Line Status Patent 05/10/19 2008   Alcohol Cap Used No 05/10/19 1510        Opportunity for questions and clarification was provided.      Patient transported with:   O2 @ 3 liters  Registered Nurse

## 2019-05-12 LAB — CBC WITH AUTOMATED DIFF
ABS. BASOPHILS: 0 10*3/uL (ref 0.0–0.2)
ABS. EOSINOPHILS: 0.3 10*3/uL (ref 0.0–0.8)
ABS. IMM. GRANS.: 0 10*3/uL (ref 0.0–0.5)
ABS. LYMPHOCYTES: 1.4 10*3/uL (ref 0.5–4.6)
ABS. MONOCYTES: 0.8 10*3/uL (ref 0.1–1.3)
ABS. NEUTROPHILS: 3.3 10*3/uL (ref 1.7–8.2)
ABSOLUTE NRBC: 0 10*3/uL (ref 0.0–0.2)
BASOPHILS: 0 % (ref 0.0–2.0)
EOSINOPHILS: 5 % (ref 0.5–7.8)
HCT: 30.8 % — ABNORMAL LOW (ref 35.8–46.3)
HGB: 9.6 g/dL — ABNORMAL LOW (ref 11.7–15.4)
IMMATURE GRANULOCYTES: 1 % (ref 0.0–5.0)
LYMPHOCYTES: 24 % (ref 13–44)
MCH: 30.6 PG (ref 26.1–32.9)
MCHC: 31.2 g/dL — ABNORMAL LOW (ref 31.4–35.0)
MCV: 98.1 FL — ABNORMAL HIGH (ref 79.6–97.8)
MONOCYTES: 14 % — ABNORMAL HIGH (ref 4.0–12.0)
MPV: 9.9 FL (ref 9.4–12.3)
NEUTROPHILS: 57 % (ref 43–78)
PLATELET: 116 10*3/uL — ABNORMAL LOW (ref 150–450)
RBC: 3.14 M/uL — ABNORMAL LOW (ref 4.05–5.2)
RDW: 13.6 % (ref 11.9–14.6)
WBC: 5.9 10*3/uL (ref 4.3–11.1)

## 2019-05-12 LAB — PLEASE READ & DOCUMENT PPD TEST IN 48 HRS
PPD: NEGATIVE Negative
mm Induration: 0 mm (ref 0–5)

## 2019-05-12 MED FILL — MEROPENEM 500 MG IV SOLR: 500 mg | INTRAVENOUS | Qty: 500

## 2019-05-12 MED FILL — CALCIUM 500 + D 500 MG-5 MCG (200 UNIT) TABLET: 500 mg-5 mcg (200 unit) | ORAL | Qty: 1

## 2019-05-12 MED FILL — MAPAP (ACETAMINOPHEN) 325 MG TABLET: 325 mg | ORAL | Qty: 2

## 2019-05-12 MED FILL — VITAMIN C 500 MG TABLET: 500 mg | ORAL | Qty: 2

## 2019-05-12 MED FILL — TRAZODONE 50 MG TAB: 50 mg | ORAL | Qty: 1

## 2019-05-12 MED FILL — OYSTER SHELL CALCIUM 500  500 MG CALCIUM (1,250 MG) TABLET: 500 mg calcium (1,250 mg) | ORAL | Qty: 1

## 2019-05-12 MED FILL — STOOL SOFTENER-STIMULANT LAXATIVE 8.6 MG-50 MG TABLET: ORAL | Qty: 1

## 2019-05-12 MED FILL — HYDROCODONE-ACETAMINOPHEN 5 MG-325 MG TAB: 5-325 mg | ORAL | Qty: 1

## 2019-05-12 MED FILL — ATORVASTATIN 10 MG TAB: 10 mg | ORAL | Qty: 1

## 2019-05-12 MED FILL — FERROUS SULFATE 325 MG (65 MG ELEMENTAL IRON) TAB: 325 mg (65 mg iron) | ORAL | Qty: 1

## 2019-05-12 MED FILL — PAROXETINE 20 MG TAB: 20 mg | ORAL | Qty: 1

## 2019-05-12 MED FILL — ASPIRIN 325 MG TAB: 325 mg | ORAL | Qty: 1

## 2019-05-12 NOTE — Progress Notes (Signed)
Hospitalist Progress Note    05/12/2019  Admit Date: 05/09/2019  5:26 PM   NAME: Cathy Jackson   DOB:  August 11, 1946   MRN:  [id number]   Attending: Truitt Merle, DO  PCP:  Wilfred Lacy, NP    SUBJECTIVE:   73 y/o female with severe frontotemporal dementia, debility, bladder cancer, s/p ileal conduit, s/p nephro-ureteral tube placement 03/29/19, hypothyroid, GERD who suffered a fall today at a rehab facility. She was recently admitted 8/8-8/12 for ESBL Klebsiella UTI. Patient tried to stand but she could not. There was concern that she could have broken her hip. Xray does not show definite displaced fracture, but MRI reveals subcapital fracture of the left femoral neck. UA pos for UTI. Started on Merem given recent ESBL infection.     Patient is s/p hip fracture repair, POD 2. Overall doing better. Following commands but not vocal. Tolerating diet. Physical therapy on board.       Review of Systems negative with exception of pertinent positives noted above  PHYSICAL EXAM     Visit Vitals  BP 135/65 (BP 1 Location: Left arm, BP Patient Position: At rest)   Pulse 99   Temp 97.8 ??F (36.6 ??C)   Resp 18   Ht 5\' 2"  (1.575 m)   Wt 49.9 kg (110 lb)   LMP  (LMP Unknown)   SpO2 98%   BMI 20.12 kg/m??      Temp (24hrs), Avg:97.8 ??F (36.6 ??C), Min:97.4 ??F (36.3 ??C), Max:98.1 ??F (36.7 ??C)    Oxygen Therapy  O2 Sat (%): 98 % (05/12/19 1522)  Pulse via Oximetry: 99 beats per minute (05/10/19 1520)  O2 Device: Room air (05/11/19 1332)  O2 Flow Rate (L/min): 3 l/min (05/10/19 1510)    Intake/Output Summary (Last 24 hours) at 05/12/2019 1651  Last data filed at 05/12/2019 0227  Gross per 24 hour   Intake ???   Output 500 ml   Net -500 ml      General: No acute distress????aphasic  Lungs:  CTA Bilaterally.   Heart:  Regular rate and rhythm,?? No murmur, rub, or gallop  Abdomen: Soft, Non distended, Non tender, Positive bowel sounds  Extremities: No cyanosis, clubbing or edema, s/p left hip fracture repair, dressing in place   Neurologic:?? No focal deficits    ASSESSMENT      Active Hospital Problems    Diagnosis Date Noted   ??? Closed left hip fracture ([place]) 05/09/2019   ??? Low grade fever 05/09/2019   ??? Dehydration 05/09/2019   ??? Acquired hypothyroidism 08/29/2015   ??? Essential hypertension 08/29/2015   ??? Depression with anxiety 02/01/2015     Last Assessment & Plan:   History of depression, anxiety, and agitation. Currently taking Celexa 20 mg- for about 3-4 years. Tolerating well and denies ADEs. Plan: Continue Celexa 20 mg daily.      ??? Frontotemporal dementia ([place]) 02/01/2015     Last Assessment & Plan:   Disinhibited, agitated, aggressive, violent at times. Will add depakote to try to calm her behaviors down. Might need to adjust namenda, aricept, and paxil, too but will start with depakote. Provided information about the drug, anticipated effects, and possible side effects. Family will call me next week and let me know how she is doing, we may need to increase the dose, but since she is small, we will need to monitor for fall risk.     Daughter inquired about PACE program. She isnt medicaid eligible, I dont  think, but out of pocket might be an option for daytime care. She is also going to look into senior day programs. I will refer her daughter to the alz association for additional resources. Might need to send daughter REACH at next appt, too.      ??? Asthma 10/27/2012   ??? GERD without esophagitis 10/27/2012   ??? Bladder cancer ([place]) 09/20/2012     A/p    Left hip fracture s/p surgical repair, POD 2  Px mgmt and dvt prophy as per ortho  Physical therapy     UTI - with hx of ESBL:    Follow for final cultures.   Cont merem D4    Severe dementia - aware:  Behavior stable. Cont home meds (trazadone/ paxil)    DVT Prophylaxis: asa 325mg  daily/ SCD's as per ortho    Signed By: Malachi Pro, MD     May 12, 2019

## 2019-05-12 NOTE — Progress Notes (Signed)
May 12, 2019         Post Op day: 2 Days Post-Op Procedure(s) (LRB):  LEFT FEMORAL NECK SYSTEM (Left)      Admit Date: 05/09/2019  Admit Diagnosis: Closed left hip fracture (HCC) [S72.002A]  Low grade fever [R50.9]       Principle Problem: Closed left hip fracture ([place]).           Subjective: Doing ok, No complaints, No SOB, No Chest Pain, No Nausea or Vomiting     Objective:   Vital Signs are Stable, No Acute Distress, Alert but disoriented,  Dressing is Dry,  Neurovascular exam is normal.     Assessment / Plan :  Patient Active Problem List   Diagnosis Code   ??? Bladder cancer ([place]) C67.9   ??? Urethral caruncle N36.2   ??? Unspecified disorder of bladder N32.9   ??? Asthma J45.909   ??? GERD without esophagitis K21.9   ??? Acquired hypothyroidism E03.9   ??? Essential hypertension I10   ??? Primary osteoarthritis involving multiple joints M89.49   ??? Dysthymia F34.1   ??? Mixed hyperlipidemia E78.2   ??? Perennial allergic rhinitis J30.89   ??? Frontotemporal dementia (HCC) G31.09, F02.80   ??? Allergic rhinitis J30.9   ??? Iron deficiency anemia D50.9   ??? Depression with anxiety F41.8   ??? Hypotension I95.9   ??? AKI (acute kidney injury) ([place]) N17.9   ??? Acute metabolic encephalopathy [id number]   ??? Urinary tract infection due to ESBL Klebsiella N39.0, B96.89   ??? Lactic acidosis E87.2   ??? Hypoalbuminemia E88.09   ??? Severe sepsis with acute organ dysfunction (HCC) A41.9, R65.20   ??? Closed left hip fracture (HCC) S72.002A   ??? Low grade fever R50.9   ??? Dehydration E86.0      Patient Vitals for the past 8 hrs:   BP Temp Pulse Resp SpO2   05/12/19 0728 116/78 98 ??F (36.7 ??C) 85 17 92 %   05/12/19 0318 123/66 97.4 ??F (36.3 ??C) 84 18 91 %    Temp (24hrs), Avg:97.8 ??F (36.6 ??C), Min:97.4 ??F (36.3 ??C), Max:98.1 ??F (36.7 ??C)    Body mass index is 20.12 kg/m??.    Lab Results   Component Value Date/Time    HGB 9.6 (L) 05/12/2019 05:54 AM        S/P left hip femoral neck system: dressing change today. Minimize narcotics   Medical Mgmt per hospitalist   Anticoagulation plan: ASA 325mg  daily   Continue PT  Fall Precautions  DC disp: rehab placement        Signed By: Kandy Garrison, PA  05/12/2019,  8:40 AM

## 2019-05-12 NOTE — Progress Notes (Signed)
Problem: Self Care Deficits Care Plan (Adult)  Goal: *Acute Goals and Plan of Care (Insert Text)  Outcome: Progressing Towards Goal  Note: 1. Pt will toilet with mod A   2. Pt will complete functional mobility for ADLs with min A  3. Pt will complete lower body dressing with mod A using AE as needed  4. Pt will complete grooming and hygiene with set up  5. Pt will complete HEP with min cues to promote increased BUE strength and functional use for ADLs  6. Pt will maintain standing balance for ADLs with CGA  7. Pt will complete bed mobility with CGA in prep for ADLs    Timeframe: 7 days       OCCUPATIONAL THERAPY: Daily Note and AM 05/12/2019  INPATIENT: OT Visit Days: 1  Payor: [employer] MEDICARE / Plan: [employer] MEDICARE ADVANTAGE / Product Type: Managed Care Medicare /      NAME/AGE/GENDER: Cathy Jackson is a 73 y.o. female   PRIMARY DIAGNOSIS:  Closed left hip fracture (HCC) [S72.002A]  Low grade fever [R50.9] Closed left hip fracture (HCC) Closed left hip fracture (HCC)  Procedure(s) (LRB):  LEFT FEMORAL NECK SYSTEM (Left)  2 Days Post-Op  ICD-10: Treatment Diagnosis:    ?? History of falling (Z91.81)   Precautions/Allergies:    WBAT Patient has no known allergies.      ASSESSMENT:     Cathy Jackson presents with L hip fx d/t falling at rehab, WBAT. Pt presented A&O to person only, minimally verbal, unable to provide detailed PLOF information but did indicate that she required some assistance for bathing, dressing, and toileting. Pt presented with deficits in mobility, balance, endurance, strength, and cognition impacting ADLs.     Pt presents sitting up in chair upon arrival. Pt alert, confused and somewhat cooperative for therapy. Attempted to assist patient with self feeding this session. Pt required assistance with food/drink to mouth at this time. Pt had a hard time following commands this visit. Attempted to get patient to perform UE exercises as well this session but patient would  not follow. Pt left in chair with all needs in reach. No progress made this session. Pt will continue to benefit from skilled OT during stay.   This section established at most recent assessment   PROBLEM LIST (Impairments causing functional limitations):  1. Decreased Strength  2. Decreased ADL/Functional Activities  3. Decreased Transfer Abilities  4. Decreased Balance  5. Decreased Cognition   INTERVENTIONS PLANNED: (Benefits and precautions of occupational therapy have been discussed with the patient.)  1. Activities of daily living training  2. Adaptive equipment training  3. Balance training  4. Therapeutic activity  5. Therapeutic exercise     TREATMENT PLAN: Frequency/Duration: Follow patient 3 times/ week to address above goals.  Rehabilitation Potential For Stated Goals: [place] (at time of discharge pending progress):    Placement:  It is my opinion, based on this patient's performance to date, that Ms. Mctyre may benefit from intensive therapy at a [facility] after discharge due to the functional deficits listed above that are likely to improve with skilled rehabilitation and concerns that he/she may be unsafe to be unsupervised at home due to fall risk, inability to mobilize .  Equipment:   ? None at this time              OCCUPATIONAL PROFILE AND HISTORY:   History of Present Injury/Illness (Reason for Referral):  See H&P  Past Medical History/Comorbidities:   Cathy Jackson  has a past medical history of Abdominal pain, unspecified site (10/27/2012), Arthritis, Asthma (10/27/2012), Bladder cancer ([place]), Depression, Dysuria (10/27/2012), Environmental allergies, GERD (gastroesophageal reflux disease) (10/27/2012), History of kidney stones, Hypothyroidism, Other "heavy-for-dates" infants, Unspecified disorder of bladder (10/27/2012), Urethral caruncle (10/27/2012), Urinary frequency (10/27/2012), and Urinary tract infection, site not specified (10/27/2012).  She also has no past medical history of Difficult intubation, Malignant hyperthermia due to anesthesia, Nausea & vomiting, Pseudocholinesterase deficiency, or Unspecified adverse effect of anesthesia.  Cathy Jackson  has a past surgical history that includes hx breast biopsy (Left, 2000); hx cesarean section; hx tubal ligation; pr close cystostomy; hx wrist fracture tx (Left); hx cholecystectomy; hx nephrostomy (2014); ir exchange nephro perc rt si (10/23/2017); ir convert nephro perc rt to nephroureteral cath existing si (11/12/2017); ir change internal ureteral stent si (02/15/2018); ir change internal ureteral stent si (04/15/2018); ir change internal ureteral stent si (06/14/2018); ir change uret tube via ileal conduit (09/07/2018); ir change uret tube via ileal conduit (12/20/2018); ir change uret tube via ileal conduit (03/21/2019); and ir nephroureteral perc rt plc cath new access  si (03/29/2019).  Social History/Living Environment:    unknown  Prior Level of Function/Work/Activity:  See assessment      Number of Personal Factors/Comorbidities that affect the Plan of Care: Expanded review of therapy/medical records (1-2):  MODERATE COMPLEXITY   ASSESSMENT OF OCCUPATIONAL PERFORMANCE::   Activities of Daily Living:   Basic ADLs (From Assessment) Complex ADLs (From Assessment)   Feeding: Setup  Oral Facial Hygiene/Grooming: Minimum assistance  Bathing: Maximum assistance  Upper Body Dressing: Moderate assistance  Lower Body Dressing: Total assistance  Toileting: Maximum assistance, Total assistance Instrumental ADL  Meal Preparation: Total assistance  Homemaking: Total assistance   Grooming/Bathing/Dressing Activities of Daily Living     Cognitive Retraining  Safety/Judgement: Decreased awareness of environment     Feeding  Food to Mouth: Minimum assistance;Moderate assistance  Drink to Mouth: Minimum assistance                 Bed/Mat Mobility  Supine to Sit: Moderate assistance  Sit to Stand: Moderate assistance   Stand to Sit: Moderate assistance  Bed to Chair: Moderate assistance  Scooting: Maximum assistance     Most Recent Physical Functioning:   Gross Assessment:                  Posture:  Posture (WDL): Exceptions to WDL  Posture Assessment: Forward head, Rounded shoulders, Trunk flexion  Balance:  Sitting: Impaired  Sitting - Static: Fair (occasional)  Sitting - Dynamic: Fair (occasional)  Standing - Static: Fair  Standing - Dynamic : Poor Bed Mobility:  Supine to Sit: Moderate assistance  Scooting: Maximum assistance  Wheelchair Mobility:     Transfers:  Sit to Stand: Moderate assistance  Stand to Sit: Moderate assistance  Bed to Chair: Moderate assistance            Patient Vitals for the past 6 hrs:   BP BP Patient Position SpO2 Pulse   05/12/19 1150 115/70 Sitting 95 % 94       Mental Status  Neurologic State: Alert  Orientation Level: Oriented to person  Cognition: Decreased attention/concentration, Decreased command following  Perception: Appears intact  Perseveration: No perseveration noted  Safety/Judgement: Decreased awareness of environment  Physical Skills Involved:  1. Balance  2. Strength  3. Activity Tolerance Cognitive Skills Affected (resulting in the inability to perform in a timely and safe manner):  1. Executive Function  2. Immediate Memory  3. Short Term Recall  4. Long Term Memory  5. Sustained Attention  6. Divided Attention  7. Comprehension  8. Expression Psychosocial Skills Affected:  1. Habits/Routines   Number of elements that affect the Plan of Care: 5+:  HIGH COMPLEXITY   CLINICAL DECISION MAKING:   [place] AM-PAC??? ???6 Clicks???   Daily Activity Inpatient Short Form  How much help from another person does the patient currently need... Total A Lot A Little None   1.  Putting on and taking off regular lower body clothing?   [x]  1   []  2   []  3   []  4   2.  Bathing (including washing, rinsing, drying)?   []  1   [x]  2   []  3   []  4    3.  Toileting, which includes using toilet, bedpan or urinal?   []  1   [x]  2   []  3   []  4   4.  Putting on and taking off regular upper body clothing?   []  1   []  2   [x]  3   []  4   5.  Taking care of personal grooming such as brushing teeth?   []  1   []  2   [x]  3   []  4   6.  Eating meals?   []  1   []  2   [x]  3   []  4   ?? 2007, [employer], under license to [employer]. All rights reserved      Score:  Initial: 14 Most Recent: X (Date: -- )    Interpretation of Tool:  Represents activities that are increasingly more difficult (i.e. Bed mobility, Transfers, Gait).    Medical Necessity:     ?? Patient is expected to demonstrate progress in strength, balance and functional technique to decrease assistance required with ADLs.  Reason for Services/Other Comments:  ?? Patient continues to require present interventions due to patient's inability to complete ADLs.   Use of outcome tool(s) and clinical judgement create a POC that gives a: MODERATE COMPLEXITY         TREATMENT:   (In addition to Assessment/Re-Assessment sessions the following treatments were rendered)     Pre-treatment Symptoms/Complaints:    Pain: Initial:   Pain Intensity 1: 0  Post Session:  0     Self Care: (8 min): Procedure(s) (per grid) utilized to improve and/or restore self-care/home management as related to self feeding. Required moderate assistance cueing to facilitate activities of daily living skills and compensatory activities.    Braces/Orthotics/Lines/Etc:   ?? O2 Device: Nasal cannula  Treatment/Session Assessment:    ?? Response to Treatment:  No adverse reaction   ?? Interdisciplinary Collaboration:   o Certified Occupational Therapy Assistant  o Registered Nurse  ?? After treatment position/precautions:   o Up in chair  o Bed alarm/tab alert on  o Bed/Chair-wheels locked  o Call light within reach   ?? Compliance with Program/Exercises: Will assess as treatment progresses.   ?? Recommendations/Intent for next treatment session:  "Next visit will focus on advancements to more challenging activities and reduction in assistance provided".  Total Treatment Duration:  OT Patient Time In/Time Out  Time In: 1020  Time Out: 1030  Vianey Caniglia Clementeen [place], COTA

## 2019-05-12 NOTE — Progress Notes (Signed)
Problem: Mobility Impaired (Adult and Pediatric)  Goal: *Acute Goals and Plan of Care (Insert Text)  Outcome: Progressing Towards Goal  Note:   LTG:  (1.)Ms. Muellner will move from supine to sit and sit to supine , scoot up and down, and roll side to side with stand by assist within 7 treatment day(s).    (2.)Ms. Murnane will transfer from bed to chair and chair to bed with minimal assist using the least restrictive device within 7 treatment day(s).    (3.)Ms. Bateman will ambulate with minimal assist for 20 feet with the least restrictive device within 7 treatment day(s).   (4.)Ms. Berganza will perform seated lower extremity exercises for 15+ minutes to improve strength and mobility within 7 days.  ________________________________________________________________________________________________     PHYSICAL THERAPY: Daily Note and PM 05/12/2019  INPATIENT: PT Visit Days : 2  Payor: AETNA MEDICARE / Plan: [facility] AETNA MEDICARE ADVANTAGE / Product Type: Managed Care Medicare /       NAME/AGE/GENDER: Alinea Siess is a 73 y.o. female   PRIMARY DIAGNOSIS: Closed left hip fracture (HCC) [S72.002A]  Low grade fever [R50.9] Closed left hip fracture (HCC) Closed left hip fracture (HCC)  Procedure(s) (LRB):  LEFT FEMORAL NECK SYSTEM (Left)  2 Days Post-Op  ICD-10: Treatment Diagnosis:    ?? Generalized Muscle Weakness (M62.81)  ?? Difficulty in walking, Not elsewhere classified (R26.2)  ?? Other abnormalities of gait and mobility (R26.89)  ?? History of falling (Z91.81)   Precaution/Allergies:  Patient has no known allergies.      ASSESSMENT:     Ms. Lyerly is a 73 year old female with history of advanced frontotemporal dementia who is admitted from rehab facility with left hip fracture; seen s/p above surgery and is WBAT per ortho orders.   This afternoon, pt presents sitting up in chair and seems more alert and is talking more.  Pt initially pushing walker away, however once she stood  she did well pushing on the walker.  Pt stood with mod assist and was able to take a few steps to bed using rolling walker and min assist.  Pt's urostomy bag seemingly is leaking and dripping down pt's leg.  Pt returned supine and assisted in cleaning her up.  Pt was able to bridge to reposition in bed.  Pt was left supine with needs in reach, alarm on, and RN notified of seal on urostomy leaking.  Pt seems to do a little better in the afternoons, requiring less assistance.  Pt making slow progress.  Will continue with POC.       This section established at most recent assessment   PROBLEM LIST (Impairments causing functional limitations):  1. Decreased Strength  2. Decreased Transfer Abilities  3. Decreased Ambulation Ability/Technique  4. Decreased Balance  5. Increased Pain  6. Decreased Activity Tolerance  7. Decreased Flexibility/Joint Mobility  8. Decreased Skin Integrity/Hygeine  9. Decreased Cognition   INTERVENTIONS PLANNED: (Benefits and precautions of physical therapy have been discussed with the patient.)  1. Balance Exercise  2. Bed Mobility  3. Gait Training  4. Home Exercise Program (HEP)  5. Therapeutic Activites  6. Therapeutic Exercise/Strengthening  7. Transfer Training     TREATMENT PLAN: Frequency/Duration: twice daily for duration of hospital stay  Rehabilitation Potential For Stated Goals: Good     REHAB RECOMMENDATIONS (at time of discharge pending progress):    Placement:  It is my opinion, based on this patient's performance to date, that Ms. Grandville Silos may  benefit from intensive therapy at a [facility] after discharge due to the functional deficits listed above that are likely to improve with skilled rehabilitation and concerns that he/she may be unsafe to be unsupervised at home due to weakness, fall risk, unsteady gait, need for heavy assistance with mobility/transfers .  Equipment:   ? Tbd at rehab              HISTORY:    History of Present Injury/Illness (Reason for Referral):  PER H&P, "Patient is a 73 y/o female with severe frontotemporal dementia, debility, bladder cancer, s/p ileal conduit, s/p nephro-ureteral tube placement 03/29/19, hypothyroid, GERD who suffered a fall today at a rehab facility. She was recently admitted 8/8-8/12 for ESBL Klebsiella UTI. Patient tried to stand but she could not. There was concern that she could have broken her hip. Xray does not show definite displaced fracture, but MRI reveals subcapital fracture of the left femoral neck. Bilateral edema like signal in the pubis bones may be a stress reaction or osteitis pubis. She also has a low grade temp of 100.2 without elevated WBC ct. Chest xray is clear. Her urine in bag is dark, concentrated and cloudy with sediment. UA with culture is pending. She will be admitted for further workup and treatment."    Past Medical History/Comorbidities:   Ms. Gugliuzza  has a past medical history of Abdominal pain, unspecified site (10/27/2012), Arthritis, Asthma (10/27/2012), Bladder cancer ([place]), Depression, Dysuria (10/27/2012), Environmental allergies, GERD (gastroesophageal reflux disease) (10/27/2012), History of kidney stones, Hypothyroidism, Other "heavy-for-dates" infants, Unspecified disorder of bladder (10/27/2012), Urethral caruncle (10/27/2012), Urinary frequency (10/27/2012), and Urinary tract infection, site not specified (10/27/2012). She also has no past medical history of Difficult intubation, Malignant hyperthermia due to anesthesia, Nausea & vomiting, Pseudocholinesterase deficiency, or Unspecified adverse effect of anesthesia.  Ms. Hebb  has a past surgical history that includes hx breast biopsy (Left, 2000); hx cesarean section; hx tubal ligation; pr close cystostomy; hx wrist fracture tx (Left); hx cholecystectomy; hx nephrostomy (2014); ir exchange nephro perc rt si (10/23/2017); ir convert nephro perc rt to nephroureteral  cath existing si (11/12/2017); ir change internal ureteral stent si (02/15/2018); ir change internal ureteral stent si (04/15/2018); ir change internal ureteral stent si (06/14/2018); ir change uret tube via ileal conduit (09/07/2018); ir change uret tube via ileal conduit (12/20/2018); ir change uret tube via ileal conduit (03/21/2019); and ir nephroureteral perc rt plc cath new access  si (03/29/2019).  Social History/Living Environment:      Prior Level of Function/Work/Activity:  From rehab. Advanced dementia. Unclear prior level of function at rehab facility.     Number of Personal Factors/Comorbidities that affect the Plan of Care: 3+: HIGH COMPLEXITY   EXAMINATION:   Most Recent Physical Functioning:   Gross Assessment:                  Posture:     Balance:  Sitting - Static: Fair (occasional)(+)  Sitting - Dynamic: Fair (occasional)  Standing - Static: Fair  Standing - Dynamic : Fair;Constant support Bed Mobility:  Supine to Sit: Moderate assistance  Sit to Supine: Moderate assistance  Scooting: Moderate assistance;Maximum assistance  Wheelchair Mobility:     Transfers:  Sit to Stand: Moderate assistance  Stand to Sit: Minimum assistance  Bed to Chair: Moderate assistance  Gait:  Left Side Weight Bearing: As tolerated  Base of Support: Center of gravity altered;Narrowed  Speed/Cadence: Shuffled;Slow  Step Length: Left shortened;Right shortened  Gait Abnormalities:  Decreased step clearance;Shuffling gait;Antalgic  Distance (ft): 4 Feet (ft)  Assistive Device: Walker, rolling  Ambulation - Level of Assistance: Minimal assistance  Interventions: Safety awareness training;Verbal cues;Tactile cues      Body Structures Involved:  1. Eyes and Ears  2. Bones  3. Joints  4. Muscles Body Functions Affected:  1. Mental  2. Sensory/Pain  3. Neuromusculoskeletal  4. Movement Related Activities and Participation Affected:  1. General Tasks and Demands  2. Mobility  3. Domestic Life  4. Community, Social and [employer]    Number of elements that affect the Plan of Care: 4+: HIGH COMPLEXITY   CLINICAL PRESENTATION:   Presentation: Evolving clinical presentation with changing clinical characteristics: MODERATE COMPLEXITY   CLINICAL DECISION MAKING:   [place]??? ???6 Clicks???   Basic Mobility Inpatient Short Form  How much difficulty does the patient currently have... Unable A Lot A Little None   1.  Turning over in bed (including adjusting bedclothes, sheets and blankets)?   []  1   []  2   [x]  3   []  4   2.  Sitting down on and standing up from a chair with arms ( e.g., wheelchair, bedside commode, etc.)   []  1   [x]  2   []  3   []  4   3.  Moving from lying on back to sitting on the side of the bed?   []  1   []  2   [x]  3   []  4   How much help from another person does the patient currently need... Total A Lot A Little None   4.  Moving to and from a bed to a chair (including a wheelchair)?   []  1   [x]  2   []  3   []  4   5.  Need to walk in hospital room?   []  1   [x]  2   []  3   []  4   6.  Climbing 3-5 steps with a railing?   [x]  1   []  2   []  3   []  4   ?? 2007, Trustees of [place] [employer], under license to [employer]. All rights reserved      Score:  Initial: 13 Most Recent: X (Date: -- )    Interpretation of Tool:  Represents activities that are increasingly more difficult (i.e. Bed mobility, Transfers, Gait).    Medical Necessity:     ?? Patient demonstrates   ?? fair  ??  rehab potential due to higher previous functional level.  Reason for Services/Other Comments:  ?? Patient   ?? continues to demonstrate capacity to improve strength, mobility, balance, transfers, and activity tolerance which will   ?? increase independence, decrease amount of assistance required from caregiver, and increase safety  ?? .   Use of outcome tool(s) and clinical judgement create a POC that gives a: Questionable prediction of patient's progress: MODERATE COMPLEXITY            TREATMENT:    (In addition to Assessment/Re-Assessment sessions the following treatments were rendered)   Pre-treatment Symptoms/Complaints:  pt still alert, not as interactive this afternoon  Pain: Initial:      Post Session:  0/10     Therapeutic Activity: (    25 minutes):  Therapeutic activities including Bed transfers, Chair transfers, standing balance, and Ambulation on level ground to improve mobility, strength, balance, coordination, and activity tolerance .  Required minimal to moderate assist and heavy Safety awareness  training;Verbal cues;Tactile cues to promote static and dynamic balance in standing and promote motor control of bilateral, lower extremity(s).     Therapeutic Exercise: (  0 minutes):  Exercises per grid below to improve mobility and strength.  Required mod visual, verbal and manual cues to promote proper body alignment and promote proper body posture.  Progressed range and repetitions as indicated.     Date:  05/12/19 Date:   Date:     ACTIVITY/EXERCISE AM PM AM PM AM PM   Ambulation:           Distance  Device  Duration         Seated Heel Raises X 15 B AA        Seated Toe Raises X 15 B AA        Seated Long Arc Quads X 15 B AA        Seated Marching X 15 B AA        Seated Hip Abduction X 15 B AA                 B = bilateral; AA = active assistive; A = active; P = passive        Braces/Orthotics/Lines/Etc:   ?? IV  ?? O2 Device: Room air  Treatment/Session Assessment:    ?? Response to Treatment:  Improved transfers/ambulation this afternoon.  ?? Interdisciplinary Collaboration:   o Physical Therapy Assistant  o Registered Nurse  ?? After treatment position/precautions:   o Supine in bed  o Bed alarm/tab alert on  o Bed/Chair-wheels locked  o Bed in low position  o Call light within reach  o RN notified   ?? Compliance with Program/Exercises: Will assess as treatment progresses  ?? Recommendations/Intent for next treatment session:  "Next visit will  focus on advancements to more challenging activities and reduction in assistance provided".  Total Treatment Duration:  PT Patient Time In/Time Out  Time In: 1335  Time Out: [place], PTA

## 2019-05-12 NOTE — Progress Notes (Signed)
Problem: Mobility Impaired (Adult and Pediatric)  Goal: *Acute Goals and Plan of Care (Insert Text)  Outcome: Progressing Towards Goal  Note:   LTG:  (1.)Cathy Jackson will move from supine to sit and sit to supine , scoot up and down, and roll side to side with stand by assist within 7 treatment day(s).    (2.)Cathy Jackson will transfer from bed to chair and chair to bed with minimal assist using the least restrictive device within 7 treatment day(s).    (3.)Cathy Jackson will ambulate with minimal assist for 20 feet with the least restrictive device within 7 treatment day(s).   (4.)Cathy Jackson will perform seated lower extremity exercises for 15+ minutes to improve strength and mobility within 7 days.  ________________________________________________________________________________________________     PHYSICAL THERAPY: Daily Note and AM 05/12/2019  INPATIENT: PT Visit Days : 2  Payor: AETNA MEDICARE / Plan: [facility] AETNA MEDICARE ADVANTAGE / Product Type: Managed Care Medicare /       NAME/AGE/GENDER: Cathy Jackson is a 73 y.o. female   PRIMARY DIAGNOSIS: Closed left hip fracture (HCC) [S72.002A]  Low grade fever [R50.9] Closed left hip fracture (HCC) Closed left hip fracture (HCC)  Procedure(s) (LRB):  LEFT FEMORAL NECK SYSTEM (Left)  2 Days Post-Op  ICD-10: Treatment Diagnosis:    ?? Generalized Muscle Weakness (M62.81)  ?? Difficulty in walking, Not elsewhere classified (R26.2)  ?? Other abnormalities of gait and mobility (R26.89)  ?? History of falling (Z91.81)   Precaution/Allergies:  Patient has no known allergies.      ASSESSMENT:     Cathy Jackson is a 73 year old female with history of advanced frontotemporal dementia who is admitted from rehab facility with left hip fracture; seen s/p above surgery and is WBAT per ortho orders.   Pt presents supine in bed, alert, confused.  Pt required mod assist for supine to sit to side of bed.  Pt pushing rolling walker away so she stood  4 times with mod assist and then performed SPT to chair with mod assist.  Assisted pt with LE ROM exercises while up in chair.  Pt was left with alarm on and needs in reach. Pt's dementia limiting further mobility this morning.  Will continue with POC.      This section established at most recent assessment   PROBLEM LIST (Impairments causing functional limitations):  1. Decreased Strength  2. Decreased Transfer Abilities  3. Decreased Ambulation Ability/Technique  4. Decreased Balance  5. Increased Pain  6. Decreased Activity Tolerance  7. Decreased Flexibility/Joint Mobility  8. Decreased Skin Integrity/Hygeine  9. Decreased Cognition   INTERVENTIONS PLANNED: (Benefits and precautions of physical therapy have been discussed with the patient.)  1. Balance Exercise  2. Bed Mobility  3. Gait Training  4. Home Exercise Program (HEP)  5. Therapeutic Activites  6. Therapeutic Exercise/Strengthening  7. Transfer Training     TREATMENT PLAN: Frequency/Duration: twice daily for duration of hospital stay  Rehabilitation Potential For Stated Goals: Good     REHAB RECOMMENDATIONS (at time of discharge pending progress):    Placement:  It is my opinion, based on this patient's performance to date, that Cathy Jackson may benefit from intensive therapy at a [facility] after discharge due to the functional deficits listed above that are likely to improve with skilled rehabilitation and concerns that he/she may be unsafe to be unsupervised at home due to weakness, fall risk, unsteady gait, need for heavy assistance with mobility/transfers .  Equipment:   ?  Tbd at rehab              HISTORY:   History of Present Injury/Illness (Reason for Referral):  PER H&P, "Patient is a 73 y/o female with severe frontotemporal dementia, debility, bladder cancer, s/p ileal conduit, s/p nephro-ureteral tube placement 03/29/19, hypothyroid, GERD who suffered a fall today at a rehab  facility. She was recently admitted 8/8-8/12 for ESBL Klebsiella UTI. Patient tried to stand but she could not. There was concern that she could have broken her hip. Xray does not show definite displaced fracture, but MRI reveals subcapital fracture of the left femoral neck. Bilateral edema like signal in the pubis bones may be a stress reaction or osteitis pubis. She also has a low grade temp of 100.2 without elevated WBC ct. Chest xray is clear. Her urine in bag is dark, concentrated and cloudy with sediment. UA with culture is pending. She will be admitted for further workup and treatment."    Past Medical History/Comorbidities:   Cathy Jackson  has a past medical history of Abdominal pain, unspecified site (10/27/2012), Arthritis, Asthma (10/27/2012), Bladder cancer ([place]), Depression, Dysuria (10/27/2012), Environmental allergies, GERD (gastroesophageal reflux disease) (10/27/2012), History of kidney stones, Hypothyroidism, Other "heavy-for-dates" infants, Unspecified disorder of bladder (10/27/2012), Urethral caruncle (10/27/2012), Urinary frequency (10/27/2012), and Urinary tract infection, site not specified (10/27/2012). She also has no past medical history of Difficult intubation, Malignant hyperthermia due to anesthesia, Nausea & vomiting, Pseudocholinesterase deficiency, or Unspecified adverse effect of anesthesia.  Cathy Jackson  has a past surgical history that includes hx breast biopsy (Left, 2000); hx cesarean section; hx tubal ligation; pr close cystostomy; hx wrist fracture tx (Left); hx cholecystectomy; hx nephrostomy (2014); ir exchange nephro perc rt si (10/23/2017); ir convert nephro perc rt to nephroureteral cath existing si (11/12/2017); ir change internal ureteral stent si (02/15/2018); ir change internal ureteral stent si (04/15/2018); ir change internal ureteral stent si (06/14/2018); ir change uret tube via ileal conduit (09/07/2018); ir change uret tube via ileal conduit (12/20/2018); ir  change uret tube via ileal conduit (03/21/2019); and ir nephroureteral perc rt plc cath new access  si (03/29/2019).  Social History/Living Environment:      Prior Level of Function/Work/Activity:  From rehab. Advanced dementia. Unclear prior level of function at rehab facility.     Number of Personal Factors/Comorbidities that affect the Plan of Care: 3+: HIGH COMPLEXITY   EXAMINATION:   Most Recent Physical Functioning:   Gross Assessment:                  Posture:     Balance:  Sitting - Static: Fair (occasional)  Sitting - Dynamic: Fair (occasional)  Standing - Static: Fair  Standing - Dynamic : Poor Bed Mobility:  Supine to Sit: Moderate assistance  Scooting: Maximum assistance  Wheelchair Mobility:     Transfers:  Sit to Stand: Moderate assistance  Stand to Sit: Moderate assistance  Bed to Chair: Moderate assistance  Gait:  Left Side Weight Bearing: As tolerated         Body Structures Involved:  1. Eyes and Ears  2. Bones  3. Joints  4. Muscles Body Functions Affected:  1. Mental  2. Sensory/Pain  3. Neuromusculoskeletal  4. Movement Related Activities and Participation Affected:  1. General Tasks and Demands  2. Mobility  3. Domestic Life  4. Community, Social and [employer]   Number of elements that affect the Plan of Care: 4+: HIGH COMPLEXITY   CLINICAL PRESENTATION:  Presentation: Evolving clinical presentation with changing clinical characteristics: MODERATE COMPLEXITY   CLINICAL DECISION MAKING:   [place] AM-PAC??? ???6 Clicks???   Basic Mobility Inpatient Short Form  How much difficulty does the patient currently have... Unable A Lot A Little None   1.  Turning over in bed (including adjusting bedclothes, sheets and blankets)?   []  1   []  2   [x]  3   []  4   2.  Sitting down on and standing up from a chair with arms ( e.g., wheelchair, bedside commode, etc.)   []  1   [x]  2   []  3   []  4   3.  Moving from lying on back to sitting on the side of the bed?   []  1   []  2   [x]  3   []  4    How much help from another person does the patient currently need... Total A Lot A Little None   4.  Moving to and from a bed to a chair (including a wheelchair)?   []  1   [x]  2   []  3   []  4   5.  Need to walk in hospital room?   []  1   [x]  2   []  3   []  4   6.  Climbing 3-5 steps with a railing?   [x]  1   []  2   []  3   []  4   ?? 2007, [employer] of [place], under license to [employer]. All rights reserved      Score:  Initial: 13 Most Recent: X (Date: -- )    Interpretation of Tool:  Represents activities that are increasingly more difficult (i.e. Bed mobility, Transfers, Gait).    Medical Necessity:     ?? Patient demonstrates   ?? fair  ??  rehab potential due to higher previous functional level.  Reason for Services/Other Comments:  ?? Patient   ?? continues to demonstrate capacity to improve strength, mobility, balance, transfers, and activity tolerance which will   ?? increase independence, decrease amount of assistance required from caregiver, and increase safety  ?? .   Use of outcome tool(s) and clinical judgement create a POC that gives a: Questionable prediction of patient's progress: MODERATE COMPLEXITY            TREATMENT:   (In addition to Assessment/Re-Assessment sessions the following treatments were rendered)   Pre-treatment Symptoms/Complaints:  pt still alert, not as interactive this afternoon  Pain: Initial:      Post Session:  0/10     Therapeutic Activity: (    15 minutes):  Therapeutic activities including Bed transfers, Chair transfers, standing balance, and Ambulation on level ground to improve mobility, strength, balance, coordination, and activity tolerance .  Required minimal to moderate assist and heavy   to promote static and dynamic balance in standing and promote motor control of bilateral, lower extremity(s).     Therapeutic Exercise: (  9 minutes):  Exercises per grid below to improve mobility and strength.  Required mod visual, verbal and manual cues to  promote proper body alignment and promote proper body posture.  Progressed range and repetitions as indicated.     Date:  05/12/19 Date:   Date:     ACTIVITY/EXERCISE AM PM AM PM AM PM   Ambulation:           Distance  Device  Duration         Seated Heel Raises  X 15 B AA        Seated Toe Raises X 15 B AA        Seated Long Arc Quads X 15 B AA        Seated Marching X 15 B AA        Seated Hip Abduction X 15 B AA                 B = bilateral; AA = active assistive; A = active; P = passive        Braces/Orthotics/Lines/Etc:   ?? IV  ?? O2 Device: Room air  Treatment/Session Assessment:    ?? Response to Treatment:  pt performs mobility with mod assist for transfers/ambulation  ?? Interdisciplinary Collaboration:   o Physical Therapy Assistant  o Registered Nurse  ?? After treatment position/precautions:   o Up in chair  o Bed alarm/tab alert on  o Bed/Chair-wheels locked  o Bed in low position  o Call light within reach  o RN notified   ?? Compliance with Program/Exercises: Will assess as treatment progresses  ?? Recommendations/Intent for next treatment session:  "Next visit will focus on advancements to more challenging activities and reduction in assistance provided".  Total Treatment Duration:  PT Patient Time In/Time Out  Time In: 0945  Time Out: [address] PTA

## 2019-05-13 LAB — METABOLIC PANEL, BASIC
Anion gap: 5 mmol/L — ABNORMAL LOW (ref 7–16)
BUN: 14 MG/DL (ref 8–23)
CO2: 28 mmol/L (ref 21–32)
Calcium: 8.9 MG/DL (ref 8.3–10.4)
Chloride: 109 mmol/L — ABNORMAL HIGH (ref 98–107)
Creatinine: 0.6 MG/DL (ref 0.6–1.0)
GFR est AA: >60 mL/min/{1.73_m2} (ref >60)
GFR est non-AA: >60 mL/min/{1.73_m2} (ref >60)
Glucose: 87 mg/dL (ref 65–100)
Potassium: 3.3 mmol/L — ABNORMAL LOW (ref 3.5–5.1)
Sodium: 142 mmol/L (ref 136–145)

## 2019-05-13 LAB — CBC WITH AUTOMATED DIFF
ABS. BASOPHILS: 0 10*3/uL (ref 0.0–0.2)
ABS. EOSINOPHILS: 0.3 10*3/uL (ref 0.0–0.8)
ABS. IMM. GRANS.: 0 10*3/uL (ref 0.0–0.5)
ABS. LYMPHOCYTES: 1.4 10*3/uL (ref 0.5–4.6)
ABS. MONOCYTES: 0.6 10*3/uL (ref 0.1–1.3)
ABS. NEUTROPHILS: 2.3 10*3/uL (ref 1.7–8.2)
ABSOLUTE NRBC: 0 10*3/uL (ref 0.0–0.2)
BASOPHILS: 0 % (ref 0.0–2.0)
EOSINOPHILS: 6 % (ref 0.5–7.8)
HCT: 31.5 % — ABNORMAL LOW (ref 35.8–46.3)
HGB: 10.2 g/dL — ABNORMAL LOW (ref 11.7–15.4)
IMMATURE GRANULOCYTES: 0 % (ref 0.0–5.0)
LYMPHOCYTES: 30 % (ref 13–44)
MCH: 31.2 PG (ref 26.1–32.9)
MCHC: 32.4 g/dL (ref 31.4–35.0)
MCV: 96.3 FL (ref 79.6–97.8)
MONOCYTES: 12 % (ref 4.0–12.0)
MPV: 9.8 FL (ref 9.4–12.3)
NEUTROPHILS: 51 % (ref 43–78)
PLATELET: 182 10*3/uL (ref 150–450)
RBC: 3.27 M/uL — ABNORMAL LOW (ref 4.05–5.2)
RDW: 13.5 % (ref 11.9–14.6)
WBC: 4.6 10*3/uL (ref 4.3–11.1)

## 2019-05-13 LAB — CULTURE, URINE
Culture result:: 10000
Culture result:: >100000 — AB

## 2019-05-13 MED FILL — ATORVASTATIN 10 MG TAB: 10 mg | ORAL | Qty: 1

## 2019-05-13 MED FILL — CALCIUM 500 + D 500 MG-5 MCG (200 UNIT) TABLET: 500 mg-5 mcg (200 unit) | ORAL | Qty: 1

## 2019-05-13 MED FILL — MEROPENEM 500 MG IV SOLR: 500 mg | INTRAVENOUS | Qty: 500

## 2019-05-13 MED FILL — ASPIRIN 325 MG TAB: 325 mg | ORAL | Qty: 1

## 2019-05-13 MED FILL — STOOL SOFTENER-STIMULANT LAXATIVE 8.6 MG-50 MG TABLET: ORAL | Qty: 1

## 2019-05-13 MED FILL — OYSTER SHELL CALCIUM 500  500 MG CALCIUM (1,250 MG) TABLET: 500 mg calcium (1,250 mg) | ORAL | Qty: 1

## 2019-05-13 MED FILL — VITAMIN C 500 MG TABLET: 500 mg | ORAL | Qty: 2

## 2019-05-13 MED FILL — MAPAP (ACETAMINOPHEN) 325 MG TABLET: 325 mg | ORAL | Qty: 2

## 2019-05-13 MED FILL — FERROUS SULFATE 325 MG (65 MG ELEMENTAL IRON) TAB: 325 mg (65 mg iron) | ORAL | Qty: 1

## 2019-05-13 MED FILL — PAROXETINE 20 MG TAB: 20 mg | ORAL | Qty: 1

## 2019-05-13 MED FILL — TRAZODONE 50 MG TAB: 50 mg | ORAL | Qty: 1

## 2019-05-13 NOTE — Progress Notes (Signed)
Patient will need to be seen by ID.  Will plan for discharge Monday to facility as they will not admit over weekend.      Rapid Covid will need to be done Monday as previous test will have been outside the 72 hour window allowed by facility.

## 2019-05-13 NOTE — Progress Notes (Signed)
Problem: Falls - Risk of  Goal: *Absence of Falls  Description: Document Cathy Jackson Fall Risk and appropriate interventions in the flowsheet.  Outcome: Progressing Towards Goal  Note: Fall Risk Interventions:  Mobility Interventions: Bed/chair exit alarm, Communicate number of staff needed for ambulation/transfer, OT consult for ADLs, Patient to call before getting OOB, PT Consult for mobility concerns    Mentation Interventions: Adequate sleep, hydration, pain control, Bed/chair exit alarm, Door open when patient unattended, Increase mobility, More frequent rounding, Reorient patient    Medication Interventions: Bed/chair exit alarm, Patient to call before getting OOB, Teach patient to arise slowly    Elimination Interventions: Bed/chair exit alarm, Call light in reach, Patient to call for help with toileting needs, Stay With Me (per policy), Toilet paper/wipes in reach, Toileting schedule/hourly rounds    History of Falls Interventions: Bed/chair exit alarm, Door open when patient unattended, Investigate reason for fall         Problem: Patient Education: Go to Patient Education Activity  Goal: Patient/Family Education  Outcome: Progressing Towards Goal     Problem: Pressure Injury - Risk of  Goal: *Prevention of pressure injury  Description: Document Braden Scale and appropriate interventions in the flowsheet.  Outcome: Progressing Towards Goal  Note: Pressure Injury Interventions:  Sensory Interventions: Assess changes in LOC, Check visual cues for pain, Discuss PT/OT consult with provider, Keep linens dry and wrinkle-free, Maintain/enhance activity level, Minimize linen layers, Pressure redistribution bed/mattress (bed type)    Moisture Interventions: Absorbent underpads, Check for incontinence Q2 hours and as needed, Limit adult briefs, Minimize layers    Activity Interventions: Increase time out of bed, Pressure redistribution bed/mattress(bed type), PT/OT evaluation     Mobility Interventions: HOB 30 degrees or less, Pressure redistribution bed/mattress (bed type), PT/OT evaluation    Nutrition Interventions: Document food/fluid/supplement intake    Friction and Shear Interventions: Apply protective barrier, creams and emollients, Foam dressings/transparent film/skin sealants, HOB 30 degrees or less, Lift sheet, Minimize layers                Problem: Patient Education: Go to Patient Education Activity  Goal: Patient/Family Education  Outcome: Progressing Towards Goal     Problem: Patient Education: Go to Patient Education Activity  Goal: Patient/Family Education  Outcome: Progressing Towards Goal     Problem: Patient Education: Go to Patient Education Activity  Goal: Patient/Family Education  Outcome: Progressing Towards Goal     Problem: Patient Education: Go to Patient Education Activity  Goal: Patient/Family Education  Outcome: Progressing Towards Goal     Problem: Hip Fracture: Post-Op Day 2  Goal: Off Pathway (Use only if patient is Off Pathway)  Outcome: Progressing Towards Goal  Goal: Activity/Safety  Outcome: Progressing Towards Goal  Goal: Diagnostic Test/Procedures  Outcome: Progressing Towards Goal  Goal: Nutrition/Diet  Outcome: Progressing Towards Goal  Goal: Medications  Outcome: Progressing Towards Goal  Goal: Respiratory  Outcome: Progressing Towards Goal  Goal: Treatments/Interventions/Procedures  Outcome: Progressing Towards Goal  Goal: Psychosocial  Outcome: Progressing Towards Goal  Goal: Discharge Planning  Outcome: Progressing Towards Goal  Goal: *Optimal pain control at patient's stated goal  Outcome: Progressing Towards Goal  Goal: *Hemodynamically stable  Outcome: Progressing Towards Goal  Goal: *Adequate oxygenation  Outcome: Progressing Towards Goal  Goal: *Tolerates increased activity  Outcome: Progressing Towards Goal  Goal: *Tolerates nutrition therapy  Outcome: Progressing Towards Goal  Goal: *Absence of skin breakdown   Outcome: Progressing Towards Goal

## 2019-05-13 NOTE — Consults (Signed)
Infectious Disease Consult    Today's Date: 05/13/2019   Admit Date: 05/09/2019    Impression:   ?? R femoral neck fracture, s/p ORIF 05/10/2019  ?? ESBL Klebsiella in urine culture  ?? Dementia with debility  ?? Bladder cancer with ileal conduit/nephroureteral tube placed 03/29/2019    Plan:   ?? ESBL in urine culture is likely colonization and would not recommend further treatment.   ?? ID will sign off today but please call with further questions.     Anti-infectives:   ?? Meropenem (9/15-  ?? Cefazolin (9/15-9/16)    Subjective:   Date of Consultation:  May 13, 2019  Referring Physician: Fannie Knee, Hospitalist    Patient is a 73 y.o. female with an underlying medical history that includes severe dementia with debility, bladder cancer with ileal conduit and nephroureteral tube placed 03/29/2019, and recent admission (8/8-8/12) for ESBL klebsiella UTI, R-quinolones and tobramycin, treated with five days of Bactrim. She fell at her rehab facility 05/09/2019 and MRI revealed subcapital fracture of the L femoral neck. Single admission blood culture NGTD and UC is again positive with the same ESBL Klebsiella. She had an ORIF 05/10/2019 and is ready for rehab. ID has been consulted for recommendations regarding the ESBL in urine. She has been treated with meropenem for the past four days. Patient's daughter is considering hospice. WBCs 4.9k, creatinine 0.6.     Patient Active Problem List   Diagnosis Code   ??? Bladder cancer ([place]) C67.9   ??? Urethral caruncle N36.2   ??? Unspecified disorder of bladder N32.9   ??? Asthma J45.909   ??? GERD without esophagitis K21.9   ??? Acquired hypothyroidism E03.9   ??? Essential hypertension I10   ??? Primary osteoarthritis involving multiple joints M89.49   ??? Dysthymia F34.1   ??? Mixed hyperlipidemia E78.2   ??? Perennial allergic rhinitis J30.89   ??? Frontotemporal dementia (HCC) G31.09, F02.80   ??? Allergic rhinitis J30.9   ??? Iron deficiency anemia D50.9   ??? Depression with anxiety F41.8   ??? Hypotension I95.9    ??? AKI (acute kidney injury) ([place]) N17.9   ??? Acute metabolic encephalopathy [id number]   ??? Urinary tract infection due to ESBL Klebsiella N39.0, B96.89   ??? Lactic acidosis E87.2   ??? Hypoalbuminemia E88.09   ??? Severe sepsis with acute organ dysfunction (HCC) A41.9, R65.20   ??? Closed left hip fracture (HCC) S72.002A   ??? Low grade fever R50.9   ??? Dehydration E86.0     Past Medical History:   Diagnosis Date   ??? Abdominal pain, unspecified site 10/27/2012   ??? Arthritis     generalized   ??? Asthma 10/27/2012   ??? Bladder cancer ([place])    ??? Depression     managed with medication    ??? Dysuria 10/27/2012   ??? Environmental allergies     managed with medication    ??? GERD (gastroesophageal reflux disease) 10/27/2012   ??? History of kidney stones     states surgical removed   ??? Hypothyroidism     managed with medication    ??? Other "heavy-for-dates" infants     ureterectomy and ileal conduit   ??? Unspecified disorder of bladder 10/27/2012   ??? Urethral caruncle 10/27/2012   ??? Urinary frequency 10/27/2012   ??? Urinary tract infection, site not specified 10/27/2012      Family History   Problem Relation Age of Onset   ??? Stroke Mother    ??? Heart Disease Mother    ???  Cancer Father    ??? Asthma Father    ??? Lung Disease Father         COPD/smoker   ??? Cancer Brother    ??? Heart Disease Brother    ??? Alcohol abuse Brother       Social History     Tobacco Use   ??? Smoking status: Former Smoker     Packs/day: 0.50     Years: 20.00     Pack years: 10.00   ??? Smokeless tobacco: Never Used   ??? Tobacco comment: stopped at age 25   Substance Use Topics   ??? Alcohol use: No     Past Surgical History:   Procedure Laterality Date   ??? CLOSE CYSTOSTOMY      cystoscopy/ turbt x 2   ??? HX BREAST BIOPSY Left 2000    x 2   ??? HX CESAREAN SECTION      x 1   ??? HX CHOLECYSTECTOMY     ??? HX NEPHROSTOMY  2014   ??? HX TUBAL LIGATION     ??? HX WRIST FRACTURE TX Left    ??? IR CHANGE INTERNAL URETERAL STENT SI  02/15/2018   ??? IR CHANGE INTERNAL URETERAL STENT SI  04/15/2018    ??? IR CHANGE INTERNAL URETERAL STENT SI  06/14/2018   ??? IR CHANGE URET TUBE VIA ILEAL CONDUIT  09/07/2018   ??? IR CHANGE URET TUBE VIA ILEAL CONDUIT  12/20/2018   ??? IR CHANGE URET TUBE VIA ILEAL CONDUIT  03/21/2019   ??? IR CONVERT NEPHRO PERC RT TO NEPHROURETERAL CATH EXISTING SI  11/12/2017   ??? IR EXCHANGE NEPHRO PERC RT SI  10/23/2017   ??? IR NEPHROURETERAL PERC RT PLC CATH NEW ACCESS  SI  03/29/2019      Prior to Admission medications    Medication Sig Start Date End Date Taking? Authorizing Provider   traZODone (DESYREL) 50 mg tablet Take 1 Tab by mouth nightly. 04/06/19   Crenshaw, Woodson E, DO   simvastatin (ZOCOR) 20 mg tablet TAKE 1 TABLET BY MOUTH EVERY DAY AT NIGHT 01/05/19   Rae Roam F, NP   PARoxetine (PAXIL) 20 mg tablet Take 1 Tab by mouth daily. 04/20/18   Wilfred Lacy, NP   food supplemt, lactose-reduced [facility] ACTIVE CLEAR) liqd Take 237 mL by mouth four (4) times daily. 04/01/18   Morene Crocker, MD   calcium carb-vitamin D3-vit K2 600 mg-1,000 unit-90 mcg tab Take  by mouth.    Provider, Historical   senna-docusate (PERICOLACE) 8.6-50 mg per tablet Take  by mouth.    Provider, Historical   calcium carbonate (OS-CAL) 500 mg calcium (1,250 mg) tablet Take  by mouth daily.    Provider, Historical   ascorbic acid, vitamin C, (VITAMIN C) 1,000 mg tablet Take  by mouth.    Provider, Historical   ferrous sulfate (IRON) 325 mg (65 mg iron) tablet Take  by mouth Daily (before breakfast).    Provider, Historical       No Known Allergies     Review of Systems:  Review of systems not obtained due to patient factors. She has dementia and is unable to participate.     Objective:     Visit Vitals  BP 110/77 (BP 1 Location: Right arm, BP Patient Position: At rest)   Pulse 91   Temp 97.7 ??F (36.5 ??C)   Resp 18   Ht 5\' 2"  (1.575 m)   Wt 49.9 kg (110  lb)   SpO2 92%   BMI 20.12 kg/m??     Temp (24hrs), Avg:97.7 ??F (36.5 ??C), Min:97.4 ??F (36.3 ??C), Max:98.2 ??F (36.8 ??C)       Lines:  Peripheral IV:       Physical Exam:     General:  Alert, cooperative, appears stated age, no acute distress, confused   Eyes:  Sclera anicteric, no drainage, EOMI   Mouth/Throat: Mucous membranes moist   Neck: Supple   Lungs:   Clear to auscultation bilaterally, good effort   CV:  Regular rate and rhythm, no audible murmur, click, rub or gallop   Abdomen:   Soft, nontender, ileal conduit with clear yellow urine   Extremities: No cyanosis or edema   Skin: Skin color, texture, turgor normal. no acute rash   Musculoskeletal: No swelling, walks with walker   Lines/Devices:  Intact, no erythema, drainage or tenderness   Psych: Dementia and unclear baseline       Data Review:     CBC:  Recent Labs     05/13/19  0945 05/12/19  0554 05/11/19  0500   WBC 4.6 5.9 6.0   GRANS 51 57 56   MONOS 12 14* 20*   EOS 6 5 3    ANEU 2.3 3.3 3.4   ABL 1.4 1.4 1.2   HGB 10.2* 9.6* 10.0*   HCT 31.5* 30.8* 32.0*   PLT 182 116* 128*       BMP:  Recent Labs     05/13/19  0945 05/11/19  0500   CREA 0.60 0.60   BUN 14 15   NA 142 142   K 3.3* 3.5   CL 109* 107   CO2 28 28   AGAP 5* 7   GLU 87 98       LFTS:  No results for input(s): TBILI, ALT, AP, TP, ALB in the last 72 hours.    No lab exists for component: SGOT    Microbiology:     All Micro Results     Procedure Component Value Units Date/Time    CULTURE, BLOOD [id number] Collected:  05/09/19 1741    Order Status:  Completed Specimen:  Blood Updated:  05/13/19 0936     Special Requests: --        RIGHT  ARM       Culture result: NO GROWTH 4 DAYS       CULTURE, URINE [id number]  (Abnormal)  (Susceptibility) Collected:  05/10/19 0025    Order Status:  Completed Specimen:  Urine from Clean catch Updated:  05/13/19 0820     Special Requests: NO SPECIAL REQUESTS        Culture result:       >100,000 COLONIES/mL KLEBSIELLA PNEUMONIAE ** (EXTENDED SPECTRUM BETA LACTAMASE PRODUCER) **            PATIENT IS A KNOWN ESBL               10,000 to 50,000 COLONIES/mL MIXED SKIN FLORA ISOLATED           MSSA/MRSA SC BY PCR, NASAL SWAB [id number] Collected:  05/10/19 0024    Order Status:  Completed Specimen:  Nasal swab Updated:  05/10/19 0409     Special Requests: NO SPECIAL REQUESTS        Culture result:       SA target not detected.  A MRSA NEGATIVE, SA NEGATIVE test result does not preclude MRSA or SA nasal colonization.                Imaging:   None new    Signed By: Bo Mcclintock, NP     May 13, 2019

## 2019-05-13 NOTE — Progress Notes (Signed)
Problem: Self Care Deficits Care Plan (Adult)  Goal: *Acute Goals and Plan of Care (Insert Text)  Outcome: Progressing Towards Goal  Note: 1. Pt will toilet with mod A   2. Pt will complete functional mobility for ADLs with min A  3. Pt will complete lower body dressing with mod A using AE as needed  4. Pt will complete grooming and hygiene with set up  5. Pt will complete HEP with min cues to promote increased BUE strength and functional use for ADLs  6. Pt will maintain standing balance for ADLs with CGA  7. Pt will complete bed mobility with CGA in prep for ADLs    Timeframe: 7 days       OCCUPATIONAL THERAPY: Daily Note and AM 05/13/2019  INPATIENT: OT Visit Days: 3  Payor: AET[employer] MEDICARE / Plan: [employer] MEDICARE ADVANTAGE / Product Type: Managed Care Medicare /      NAME/AGE/GENDER: Cathy Jackson is a 73 y.o. female   PRIMARY DIAGNOSIS:  Closed left hip fracture (HCC) [S72.002A]  Low grade fever [R50.9] Closed left hip fracture (HCC) Closed left hip fracture (HCC)  Procedure(s) (LRB):  LEFT FEMORAL NECK SYSTEM (Left)  3 Days Post-Op  ICD-10: Treatment Diagnosis:    ?? History of falling (Z91.81)   Precautions/Allergies:    WBAT Patient has no known allergies.      ASSESSMENT:     Cathy Jackson presents with L hip fx d/t falling at rehab, WBAT. Pt presented A&O to person only, minimally verbal, unable to provide detailed PLOF information but did indicate that she required some assistance for bathing, dressing, and toileting. Pt presented with deficits in mobility, balance, endurance, strength, and cognition impacting ADLs.     Pt presents sitting up in chair upon arrival. Pt alert, confused and cooperative for therapy.Assisted patient with feeding this session. Pt did better with feeding today. Pt required verbal cueing to initiate feeding. Pt did well with her drink. She only required assistance with opening milk carton. Pt assisted over to [facility] with min assist. Pt demonstrates fair  standing balance today. Pt did better with following commands today as well. Pt left in chair with all needs in reach. Minimal progress made this session. Pt will continue to benefit from skilled OT during stay.   This section established at most recent assessment   PROBLEM LIST (Impairments causing functional limitations):  1. Decreased Strength  2. Decreased ADL/Functional Activities  3. Decreased Transfer Abilities  4. Decreased Balance  5. Decreased Cognition   INTERVENTIONS PLANNED: (Benefits and precautions of occupational therapy have been discussed with the patient.)  1. Activities of daily living training  2. Adaptive equipment training  3. Balance training  4. Therapeutic activity  5. Therapeutic exercise     TREATMENT PLAN: Frequency/Duration: Follow patient 3 times/ week to address above goals.  Rehabilitation Potential For Stated Goals: [place] (at time of discharge pending progress):    Placement:  It is my opinion, based on this patient's performance to date, that Cathy Jackson may benefit from intensive therapy at a [place] after discharge due to the functional deficits listed above that are likely to improve with skilled rehabilitation and concerns that he/she may be unsafe to be unsupervised at home due to fall risk, inability to mobilize .  Equipment:   ? None at this time              OCCUPATIONAL PROFILE AND HISTORY:  History of Present Injury/Illness (Reason for Referral):  See H&P  Past Medical History/Comorbidities:   Cathy Jackson  has a past medical history of Abdominal pain, unspecified site (10/27/2012), Arthritis, Asthma (10/27/2012), Bladder cancer ([facility]), Depression, Dysuria (10/27/2012), Environmental allergies, GERD (gastroesophageal reflux disease) (10/27/2012), History of kidney stones, Hypothyroidism, Other "heavy-for-dates" infants, Unspecified disorder of bladder (10/27/2012), Urethral caruncle (10/27/2012), Urinary frequency  (10/27/2012), and Urinary tract infection, site not specified (10/27/2012). She also has no past medical history of Difficult intubation, Malignant hyperthermia due to anesthesia, Nausea & vomiting, Pseudocholinesterase deficiency, or Unspecified adverse effect of anesthesia.  Cathy Jackson  has a past surgical history that includes hx breast biopsy (Left, 2000); hx cesarean section; hx tubal ligation; pr close cystostomy; hx wrist fracture tx (Left); hx cholecystectomy; hx nephrostomy (2014); ir exchange nephro perc rt si (10/23/2017); ir convert nephro perc rt to nephroureteral cath existing si (11/12/2017); ir change internal ureteral stent si (02/15/2018); ir change internal ureteral stent si (04/15/2018); ir change internal ureteral stent si (06/14/2018); ir change uret tube via ileal conduit (09/07/2018); ir change uret tube via ileal conduit (12/20/2018); ir change uret tube via ileal conduit (03/21/2019); and ir nephroureteral perc rt plc cath new access  si (03/29/2019).  Social History/Living Environment:    unknown  Prior Level of Function/Work/Activity:  See assessment      Number of Personal Factors/Comorbidities that affect the Plan of Care: Expanded review of therapy/medical records (1-2):  MODERATE COMPLEXITY   ASSESSMENT OF OCCUPATIONAL PERFORMANCE::   Activities of Daily Living:   Basic ADLs (From Assessment) Complex ADLs (From Assessment)   Feeding: Setup  Oral Facial Hygiene/Grooming: Minimum assistance  Bathing: Maximum assistance  Upper Body Dressing: Moderate assistance  Lower Body Dressing: Total assistance  Toileting: Maximum assistance, Total assistance Instrumental ADL  Meal Preparation: Total assistance  Homemaking: Total assistance   Grooming/Bathing/Dressing Activities of Daily Living     Cognitive Retraining  Safety/Judgement: Decreased awareness of environment;Fall prevention     Feeding  Food to Mouth: Set-up;Supervision  Drink to Mouth: Set-up;Supervision  Cues: Verbal cues provided            Functional Transfers  Toilet Transfer : Minimum assistance     Bed/Mat Mobility  Sit to Stand: Contact guard assistance;Minimum assistance     Most Recent Physical Functioning:   Gross Assessment:                  Posture:  Posture (WDL): Exceptions to WDL  Posture Assessment: Forward head, Rounded shoulders, Trunk flexion  Balance:    Bed Mobility:     Wheelchair Mobility:     Transfers:  Sit to Stand: Contact guard assistance;Minimum assistance            Patient Vitals for the past 6 hrs:   BP BP Patient Position SpO2 Pulse   05/13/19 0808 129/64 At rest 90 % 81   05/13/19 1148 110/77 At rest 92 % 91       Mental Status  Neurologic State: Alert  Orientation Level: Oriented to person, Oriented to place  Cognition: Follows commands  Perception: Appears intact  Perseveration: No perseveration noted  Safety/Judgement: Decreased awareness of environment, Fall prevention                          Physical Skills Involved:  1. Balance  2. Strength  3. Activity Tolerance Cognitive Skills Affected (resulting in the inability to perform in a timely and safe manner):  1. Executive Function  2. Immediate Memory  3. Short Term Recall  4. Long Term Memory  5. Sustained Attention  6. Divided Attention  7. Comprehension  8. Expression Psychosocial Skills Affected:  1. Habits/Routines   Number of elements that affect the Plan of Care: 5+:  HIGH COMPLEXITY   CLINICAL DECISION MAKING:   [place] AM-PAC??? ???6 Clicks???   Daily Activity Inpatient Short Form  How much help from another person does the patient currently need... Total A Lot A Little None   1.  Putting on and taking off regular lower body clothing?   [x]  1   []  2   []  3   []  4   2.  Bathing (including washing, rinsing, drying)?   []  1   [x]  2   []  3   []  4   3.  Toileting, which includes using toilet, bedpan or urinal?   []  1   [x]  2   []  3   []  4   4.  Putting on and taking off regular upper body clothing?   []  1   []  2   [x]  3   []  4    5.  Taking care of personal grooming such as brushing teeth?   []  1   []  2   [x]  3   []  4   6.  Eating meals?   []  1   []  2   [x]  3   []  4   ?? 2007, [employer], under license to [employer]. All rights reserved      Score:  Initial: 14 Most Recent: X (Date: -- )    Interpretation of Tool:  Represents activities that are increasingly more difficult (i.e. Bed mobility, Transfers, Gait).    Medical Necessity:     ?? Patient is expected to demonstrate progress in strength, balance and functional technique to decrease assistance required with ADLs.  Reason for Services/Other Comments:  ?? Patient continues to require present interventions due to patient's inability to complete ADLs.   Use of outcome tool(s) and clinical judgement create a POC that gives a: MODERATE COMPLEXITY         TREATMENT:   (In addition to Assessment/Re-Assessment sessions the following treatments were rendered)     Pre-treatment Symptoms/Complaints:    Pain: Initial:   Pain Intensity 1: 0  Post Session:  0     Self Care: (15 minutes): Procedure(s) (per grid) utilized to improve and/or restore self-care/home management as related to toileting and self feeding. Required moderate assistance cueing to facilitate activities of daily living skills and compensatory activities.    Therapeutic Activity: (10 minutes):  Therapeutic activities including Chair transfers and Toilet transfers to improve mobility, strength and balance.  Required minimal   to promote static and dynamic balance in standing.     Braces/Orthotics/Lines/Etc:   ?? O2 Device: Nasal cannula  Treatment/Session Assessment:    ?? Response to Treatment:  No adverse reaction   ?? Interdisciplinary Collaboration:   o Certified Occupational Therapy Assistant  o Registered Nurse  ?? After treatment position/precautions:   o Up in chair  o Bed alarm/tab alert on  o Bed/Chair-wheels locked  o Call light within reach  o lap belt in place    ?? Compliance with Program/Exercises: Will assess as treatment progresses.  ?? Recommendations/Intent for next treatment session:  "Next visit will focus on advancements to more challenging activities and reduction in assistance provided".  Total Treatment Duration:  OT  Patient Time In/Time Out  Time In: 1130  Time Out: Vigo Owens Shark, COTA

## 2019-05-13 NOTE — Progress Notes (Signed)
Problem: Mobility Impaired (Adult and Pediatric)  Goal: *Acute Goals and Plan of Care (Insert Text)  Outcome: Progressing Towards Goal  Note:   LTG:  (1.)Ms. Morrell will move from supine to sit and sit to supine , scoot up and down, and roll side to side with stand by assist within 7 treatment day(s).    (2.)Ms. Pattee will transfer from bed to chair and chair to bed with minimal assist using the least restrictive device within 7 treatment day(s).    (3.)Ms. Forren will ambulate with minimal assist for 20 feet with the least restrictive device within 7 treatment day(s).   (4.)Ms. Spadea will perform seated lower extremity exercises for 15+ minutes to improve strength and mobility within 7 days.  ________________________________________________________________________________________________     PHYSICAL THERAPY: Daily Note and PM 05/13/2019  INPATIENT: PT Visit Days : 3  Payor: AETNA MEDICARE / Plan: [facility] AETNA MEDICARE ADVANTAGE / Product Type: Managed Care Medicare /       NAME/AGE/GENDER: Haidyn Copley is a 73 y.o. female   PRIMARY DIAGNOSIS: Closed left hip fracture (HCC) [S72.002A]  Low grade fever [R50.9] Closed left hip fracture (HCC) Closed left hip fracture (HCC)  Procedure(s) (LRB):  LEFT FEMORAL NECK SYSTEM (Left)  3 Days Post-Op  ICD-10: Treatment Diagnosis:    ?? Generalized Muscle Weakness (M62.81)  ?? Difficulty in walking, Not elsewhere classified (R26.2)  ?? Other abnormalities of gait and mobility (R26.89)  ?? History of falling (Z91.81)   Precaution/Allergies:  Patient has no known allergies.      ASSESSMENT:     Ms. Rosengarten is a 73 year old female with history of advanced frontotemporal dementia who is admitted from rehab facility with left hip fracture; seen s/p above surgery and is WBAT per ortho orders.   This afternoon, pt supine in bed and is very awake and states she doesn't like being confined.  Pt very much wants to get up and go walking.  Pt  performed supine to sit with only CGA.   Stood and ambulated 50' in hall using rolling walker and min/mod assist.  Pt ambulates with slightly flexed posture and requires increased cues for more upright posture and staying closer to rolling walker.  Pt sat on side of bed towards the end of the bed due to fatigue.  After resting tried to get pt to take a few side steps toward head of bed, but only went one step.  Pt not following commands for side stepping.  Pt returned supine with CGA/min assist and assisted with scooting up in bed.  Pt made good progress with increased ambulation this afternoon.  Pt does seem to do much better in the afternoons once she is more awake.  Big difference in conversations and overall mobility.  Will continue with POC.     This section established at most recent assessment   PROBLEM LIST (Impairments causing functional limitations):  1. Decreased Strength  2. Decreased Transfer Abilities  3. Decreased Ambulation Ability/Technique  4. Decreased Balance  5. Increased Pain  6. Decreased Activity Tolerance  7. Decreased Flexibility/Joint Mobility  8. Decreased Skin Integrity/Hygeine  9. Decreased Cognition   INTERVENTIONS PLANNED: (Benefits and precautions of physical therapy have been discussed with the patient.)  1. Balance Exercise  2. Bed Mobility  3. Gait Training  4. Home Exercise Program (HEP)  5. Therapeutic Activites  6. Therapeutic Exercise/Strengthening  7. Transfer Training     TREATMENT PLAN: Frequency/Duration: twice daily for duration of hospital stay  Rehabilitation Potential For Stated Goals: Good     REHAB RECOMMENDATIONS (at time of discharge pending progress):    Placement:  It is my opinion, based on this patient's performance to date, that Ms. Kaler may benefit from intensive therapy at a [facility] after discharge due to the functional deficits listed above that are likely to improve with skilled rehabilitation and concerns that he/she may  be unsafe to be unsupervised at home due to weakness, fall risk, unsteady gait, need for heavy assistance with mobility/transfers .  Equipment:   ? Tbd at rehab              HISTORY:   History of Present Injury/Illness (Reason for Referral):  PER H&P, "Patient is a 73 y/o female with severe frontotemporal dementia, debility, bladder cancer, s/p ileal conduit, s/p nephro-ureteral tube placement 03/29/19, hypothyroid, GERD who suffered a fall today at a rehab facility. She was recently admitted 8/8-8/12 for ESBL Klebsiella UTI. Patient tried to stand but she could not. There was concern that she could have broken her hip. Xray does not show definite displaced fracture, but MRI reveals subcapital fracture of the left femoral neck. Bilateral edema like signal in the pubis bones may be a stress reaction or osteitis pubis. She also has a low grade temp of 100.2 without elevated WBC ct. Chest xray is clear. Her urine in bag is dark, concentrated and cloudy with sediment. UA with culture is pending. She will be admitted for further workup and treatment."    Past Medical History/Comorbidities:   Ms. Collen  has a past medical history of Abdominal pain, unspecified site (10/27/2012), Arthritis, Asthma (10/27/2012), Bladder cancer ([place]), Depression, Dysuria (10/27/2012), Environmental allergies, GERD (gastroesophageal reflux disease) (10/27/2012), History of kidney stones, Hypothyroidism, Other "heavy-for-dates" infants, Unspecified disorder of bladder (10/27/2012), Urethral caruncle (10/27/2012), Urinary frequency (10/27/2012), and Urinary tract infection, site not specified (10/27/2012). She also has no past medical history of Difficult intubation, Malignant hyperthermia due to anesthesia, Nausea & vomiting, Pseudocholinesterase deficiency, or Unspecified adverse effect of anesthesia.  Ms. Mcfarland  has a past surgical history that includes hx breast biopsy (Left, 2000); hx cesarean section; hx tubal ligation; pr close cystostomy; hx wrist  fracture tx (Left); hx cholecystectomy; hx nephrostomy (2014); ir exchange nephro perc rt si (10/23/2017); ir convert nephro perc rt to nephroureteral cath existing si (11/12/2017); ir change internal ureteral stent si (02/15/2018); ir change internal ureteral stent si (04/15/2018); ir change internal ureteral stent si (06/14/2018); ir change uret tube via ileal conduit (09/07/2018); ir change uret tube via ileal conduit (12/20/2018); ir change uret tube via ileal conduit (03/21/2019); and ir nephroureteral perc rt plc cath new access  si (03/29/2019).  Social History/Living Environment:      Prior Level of Function/Work/Activity:  From rehab. Advanced dementia. Unclear prior level of function at rehab facility.     Number of Personal Factors/Comorbidities that affect the Plan of Care: 3+: HIGH COMPLEXITY   EXAMINATION:   Most Recent Physical Functioning:   Gross Assessment:                  Posture:     Balance:  Sitting - Static: Good (unsupported)(-)  Sitting - Dynamic: Fair (occasional)(+)  Standing - Static: Fair  Standing - Dynamic : Fair;Constant support Bed Mobility:  Supine to Sit: Contact guard assistance  Sit to Supine: Contact guard assistance;Minimum assistance  Scooting: Moderate assistance  Wheelchair Mobility:     Transfers:  Sit to Stand: Contact guard assistance  Stand to Sit: Contact guard assistance;Minimum assistance  Gait:  Left Side Weight Bearing: As tolerated  Base of Support: Center of gravity altered  Speed/Cadence: Slow;Shuffled  Step Length: Right shortened;Left shortened  Gait Abnormalities: Decreased step clearance;Shuffling gait;Step to gait  Distance (ft): 50 Feet (ft)  Assistive Device: Walker, rolling  Ambulation - Level of Assistance: Minimal assistance;Moderate assistance  Interventions: Safety awareness training;Verbal cues;Tactile cues;Manual cues      Body Structures Involved:  1. Eyes and Ears  2. Bones  3. Joints  4. Muscles Body Functions Affected:  1. Mental  2. Sensory/Pain   3. Neuromusculoskeletal  4. Movement Related Activities and Participation Affected:  1. General Tasks and Demands  2. Mobility  3. Domestic Life  4. Community, Social and [employer]   Number of elements that affect the Plan of Care: 4+: HIGH COMPLEXITY   CLINICAL PRESENTATION:   Presentation: Evolving clinical presentation with changing clinical characteristics: MODERATE COMPLEXITY   CLINICAL DECISION MAKING:   [place]??? ???6 Clicks???   Basic Mobility Inpatient Short Form  How much difficulty does the patient currently have... Unable A Lot A Little None   1.  Turning over in bed (including adjusting bedclothes, sheets and blankets)?   []  1   []  2   [x]  3   []  4   2.  Sitting down on and standing up from a chair with arms ( e.g., wheelchair, bedside commode, etc.)   []  1   [x]  2   []  3   []  4   3.  Moving from lying on back to sitting on the side of the bed?   []  1   []  2   [x]  3   []  4   How much help from another person does the patient currently need... Total A Lot A Little None   4.  Moving to and from a bed to a chair (including a wheelchair)?   []  1   [x]  2   []  3   []  4   5.  Need to walk in hospital room?   []  1   [x]  2   []  3   []  4   6.  Climbing 3-5 steps with a railing?   [x]  1   []  2   []  3   []  4   ?? 2007, Trustees of [employer], under license to [employer]. All rights reserved      Score:  Initial: 13 Most Recent: X (Date: -- )    Interpretation of Tool:  Represents activities that are increasingly more difficult (i.e. Bed mobility, Transfers, Gait).    Medical Necessity:     ?? Patient demonstrates   ?? fair  ??  rehab potential due to higher previous functional level.  Reason for Services/Other Comments:  ?? Patient   ?? continues to demonstrate capacity to improve strength, mobility, balance, transfers, and activity tolerance which will   ?? increase independence, decrease amount of assistance required from caregiver, and increase safety  ?? .    Use of outcome tool(s) and clinical judgement create a POC that gives a: Questionable prediction of patient's progress: MODERATE COMPLEXITY            TREATMENT:   (In addition to Assessment/Re-Assessment sessions the following treatments were rendered)   Pre-treatment Symptoms/Complaints:  pt still alert, not as interactive this afternoon  Pain: Initial:      Post Session:  0/10     Therapeutic Activity: (  24 minutes):  Therapeutic activities including Bed transfers, Chair transfers, standing balance, and Ambulation on level ground to improve mobility, strength, balance, coordination, and activity tolerance .  Required minimal to moderate assist and heavy Safety awareness training;Verbal cues;Tactile cues;Manual cues to promote static and dynamic balance in standing and promote motor control of bilateral, lower extremity(s).     Therapeutic Exercise: (  0 minutes):  Exercises per grid below to improve mobility and strength.  Required mod visual, verbal and manual cues to promote proper body alignment and promote proper body posture.  Progressed range and repetitions as indicated.     Date:  05/12/19 Date:   Date:     ACTIVITY/EXERCISE AM PM AM PM AM PM   Ambulation:           Distance  Device  Duration         Seated Heel Raises X 15 B AA        Seated Toe Raises X 15 B AA        Seated Long Arc Quads X 15 B AA        Seated Marching X 15 B AA        Seated Hip Abduction X 15 B AA                 B = bilateral; AA = active assistive; A = active; P = passive        Braces/Orthotics/Lines/Etc:   ?? IV  ?? O2 Device: Room air  Treatment/Session Assessment:    ?? Response to Treatment:  Improved transfers/ambulation this afternoon.  ?? Interdisciplinary Collaboration:   o Physical Therapy Assistant  o Registered Nurse  ?? After treatment position/precautions:   o Supine in bed  o Bed alarm/tab alert on  o Bed/Chair-wheels locked  o Bed in low position  o Call light within reach  o RN notified    ?? Compliance with Program/Exercises: Will assess as treatment progresses  ?? Recommendations/Intent for next treatment session:  "Next visit will focus on advancements to more challenging activities and reduction in assistance provided".  Total Treatment Duration:  PT Patient Time In/Time Out  Time In: 1330  Time Out: Gwynn, PTA

## 2019-05-13 NOTE — Progress Notes (Signed)
Insurance approval received. Attending notified via [employer]. Patient will need to discharge today or Monday.  Cannot admit over weekend.

## 2019-05-13 NOTE — Progress Notes (Signed)
May 13, 2019         Post Op day: 3 Days Post-Op Procedure(s) (LRB):  LEFT FEMORAL NECK SYSTEM (Left)      Admit Date: 05/09/2019  Admit Diagnosis: Closed left hip fracture (HCC) [S72.002A]  Low grade fever [R50.9]       Principle Problem: Closed left hip fracture ([place]).           Subjective: Doing well, No complaints, No SOB, No Chest Pain, No Nausea or Vomiting     Objective:   Vital Signs are Stable, No Acute Distress, Alert,  Dressing is Dry,  Neurovascular exam is normal.     Assessment / Plan :  Patient Active Problem List   Diagnosis Code   ??? Bladder cancer ([place]) C67.9   ??? Urethral caruncle N36.2   ??? Unspecified disorder of bladder N32.9   ??? Asthma J45.909   ??? GERD without esophagitis K21.9   ??? Acquired hypothyroidism E03.9   ??? Essential hypertension I10   ??? Primary osteoarthritis involving multiple joints M89.49   ??? Dysthymia F34.1   ??? Mixed hyperlipidemia E78.2   ??? Perennial allergic rhinitis J30.89   ??? Frontotemporal dementia (HCC) G31.09, F02.80   ??? Allergic rhinitis J30.9   ??? Iron deficiency anemia D50.9   ??? Depression with anxiety F41.8   ??? Hypotension I95.9   ??? AKI (acute kidney injury) ([place]) N17.9   ??? Acute metabolic encephalopathy [id number]   ??? Urinary tract infection due to ESBL Klebsiella N39.0, B96.89   ??? Lactic acidosis E87.2   ??? Hypoalbuminemia E88.09   ??? Severe sepsis with acute organ dysfunction (HCC) A41.9, R65.20   ??? Closed left hip fracture (HCC) S72.002A   ??? Low grade fever R50.9   ??? Dehydration E86.0      Patient Vitals for the past 8 hrs:   BP Temp Pulse Resp SpO2   05/13/19 0808 129/64 98.2 ??F (36.8 ??C) 81 18 90 %   05/13/19 0305 98/66 97.6 ??F (36.4 ??C) 80 18 92 %    Temp (24hrs), Avg:97.8 ??F (36.6 ??C), Min:97.4 ??F (36.3 ??C), Max:98.2 ??F (36.8 ??C)    Body mass index is 20.12 kg/m??.    Lab Results   Component Value Date/Time    HGB 9.6 (L) 05/12/2019 05:54 AM        S/P left hip femoral neck system: dressing change today. Minimize narcotics??  Medical Mgmt per hospitalist   Anticoagulation plan:??ASA 325mg  daily??  Continue PT  Fall Precautions  DC disp:??rehab placement??       Signed By: Kandy Garrison, PA  05/13/2019,  8:47 AM

## 2019-05-13 NOTE — Consults (Signed)
Palliative Care    Patient: Cathy Jackson MRN: [id number]  SSN: [id number]    Date of Birth: 07-21-1946  Age: 73 y.o.  Sex: female       Date of Request: 05/13/2019  Date of Consult:  05/13/2019  Reason for Consult:  goals of care  Requesting Physician: Dr. Fannie Knee     Assessment/Plan:     Principal Diagnosis:    Debility, Unspecified  R53.81    Additional Diagnoses:   Advance Care Planning Counseling Z71.89  Altered Mental Status R41.82  Anorexia  R63.0  Dementia  F03.90   Failure to Thrive  R62.7  Fatigue, Lethargy  R53.83  Frailty  R54  Counseling, Encounter for Medical Advice  Z71.9  Encounter for Palliative Care  Z51.5    Palliative Performance Scale (PPS):  PPS: 30    Medical Decision Making:   Reviewed and summarized since admission  Discussed case with appropriate providers  Reviewed laboratory and x-ray data over the past 24 hours.    Pt resting in bed, minimally interactive but no distress noted.  She will open her eyes to voice, but did not answer questions.  Spoke with pt's daughter, Selah, via phone.  Introduced role of PC and reviewed events of hospitalization.   Hollie reports pt's dementia has really progressed over the last few months.  She was providing care for pt, but feels she can no longer provide care for her at home.  We discussed code status, and Hollie requests DNR status for pt.  She also states they would not want artificial nutrition if pt's intake remains poor.  Pt will return to her SNF under STR, but then will transition to a long term bed.  Counseled on possibility of transitioning to hospice once STR is completed.  Hollie voiced understanding.  No further PC needs- we will not plan to follow.     Will discuss findings with members of the interdisciplinary team.      Thank you for this referral.              Subjective:     History obtained from:  Family and Chart    Chief Complaint: s/p fall, hip/femur fracture   History of Present Illness:  Pt is 73 year old Caucasian female with a PMH of frontotemporal dementia, debility, bladder ca, hypothyroidism, GERD, s/p ileal conduit and nephro-ureteral tube placement on 03/29/2019 that presented to the ED after a fall at her rehab facility. Pt attempted to stand, was unable, concern for hip fx. X-ray did not reveal definite displaced fracture, but MRI showed a subcapital fx of the left femoral neck. Pt had a low grade fever 100.2 with elevated WBC, CXR clear. UA with culture sent for dark, cloudy concentrated urine. On 05/10/2019 pt had open left femoral neck repair with plate and screw. UA positive for UTI, treating with Merrem. Pt's PO intake remains poor, eating/drinking minimal per staff, on IVF.     Advance Directive: No       Code Status:  DNR            Health Care Power of Attorney: No - Patient does not have a [place].    Past Medical History:   Diagnosis Date   ??? Abdominal pain, unspecified site 10/27/2012   ??? Arthritis     generalized   ??? Asthma 10/27/2012   ??? Bladder cancer ([place])    ??? Depression     managed with medication    ???  Dysuria 10/27/2012   ??? Environmental allergies     managed with medication    ??? GERD (gastroesophageal reflux disease) 10/27/2012   ??? History of kidney stones     states surgical removed   ??? Hypothyroidism     managed with medication    ??? Other "heavy-for-dates" infants     ureterectomy and ileal conduit   ??? Unspecified disorder of bladder 10/27/2012   ??? Urethral caruncle 10/27/2012   ??? Urinary frequency 10/27/2012   ??? Urinary tract infection, site not specified 10/27/2012      Past Surgical History:   Procedure Laterality Date   ??? CLOSE CYSTOSTOMY      cystoscopy/ turbt x 2   ??? HX BREAST BIOPSY Left 2000    x 2   ??? HX CESAREAN SECTION      x 1   ??? HX CHOLECYSTECTOMY     ??? HX NEPHROSTOMY  2014   ??? HX TUBAL LIGATION     ??? HX WRIST FRACTURE TX Left    ??? IR CHANGE INTERNAL URETERAL STENT SI  02/15/2018    ??? IR CHANGE INTERNAL URETERAL STENT SI  04/15/2018   ??? IR CHANGE INTERNAL URETERAL STENT SI  06/14/2018   ??? IR CHANGE URET TUBE VIA ILEAL CONDUIT  09/07/2018   ??? IR CHANGE URET TUBE VIA ILEAL CONDUIT  12/20/2018   ??? IR CHANGE URET TUBE VIA ILEAL CONDUIT  03/21/2019   ??? IR CONVERT NEPHRO PERC RT TO NEPHROURETERAL CATH EXISTING SI  11/12/2017   ??? IR EXCHANGE NEPHRO PERC RT SI  10/23/2017   ??? IR NEPHROURETERAL PERC RT PLC CATH NEW ACCESS  SI  03/29/2019     Family History   Problem Relation Age of Onset   ??? Stroke Mother    ??? Heart Disease Mother    ??? Cancer Father    ??? Asthma Father    ??? Lung Disease Father         COPD/smoker   ??? Cancer Brother    ??? Heart Disease Brother    ??? Alcohol abuse Brother       Social History     Tobacco Use   ??? Smoking status: Former Smoker     Packs/day: 0.50     Years: 20.00     Pack years: 10.00   ??? Smokeless tobacco: Never Used   ??? Tobacco comment: stopped at age 54   Substance Use Topics   ??? Alcohol use: No     Prior to Admission medications    Medication Sig Start Date End Date Taking? Authorizing Provider   traZODone (DESYREL) 50 mg tablet Take 1 Tab by mouth nightly. 04/06/19   Crenshaw, Woodson E, DO   simvastatin (ZOCOR) 20 mg tablet TAKE 1 TABLET BY MOUTH EVERY DAY AT NIGHT 01/05/19   Rae Roam F, NP   PARoxetine (PAXIL) 20 mg tablet Take 1 Tab by mouth daily. 04/20/18   Wilfred Lacy, NP   food supplemt, lactose-reduced [facility] ACTIVE CLEAR) liqd Take 237 mL by mouth four (4) times daily. 04/01/18   Morene Crocker, MD   calcium carb-vitamin D3-vit K2 600 mg-1,000 unit-90 mcg tab Take  by mouth.    Provider, Historical   senna-docusate (PERICOLACE) 8.6-50 mg per tablet Take  by mouth.    Provider, Historical   calcium carbonate (OS-CAL) 500 mg calcium (1,250 mg) tablet Take  by mouth daily.    Provider, Historical   ascorbic acid, vitamin C, (VITAMIN  C) 1,000 mg tablet Take  by mouth.    Provider, Historical   ferrous sulfate (IRON) 325 mg (65 mg iron) tablet Take  by mouth Daily  (before breakfast).    Provider, Historical       No Known Allergies     Review of Systems:  Review of systems not obtained due to patient factors - AMS     Objective:     Visit Vitals  BP 129/64 (BP 1 Location: Left arm, BP Patient Position: At rest)   Pulse 81   Temp 98.2 ??F (36.8 ??C)   Resp 18   Ht 5\' 2"  (1.575 m)   Wt 110 lb (49.9 kg)   SpO2 90%   BMI 20.12 kg/m??        Physical Exam:    General:  Flat affect, aphasic   Eyes:  Conjunctivae/corneas clear    Nose: Nares normal. Septum midline.   Neck: Supple, symmetrical, trachea midline   Lungs:   Decreased and clear to auscultation bilaterally, unlabored, on RA   Heart:  Regular rate and rhythm    Abdomen:   Soft, non-tender, non-distended, hyperactive bowel sounds   Extremities: Normal, atraumatic, no cyanosis or edema   Skin: Skin color, texture, turgor normal.   Neurologic: Unable to assess   Psych: Unable to assess      Assessment:     Hospital Problems  Date Reviewed: 2018/06/02          Codes Class Noted POA    * (Principal) Closed left hip fracture ([place]) ICD-10-CM: [id number]  ICD-9-CM: 820.8  05/09/2019 Yes        Low grade fever ICD-10-CM: R50.9  ICD-9-CM: 780.60  05/09/2019 Yes        Dehydration ICD-10-CM: E86.0  ICD-9-CM: 276.51  05/09/2019 Yes        Acquired hypothyroidism ICD-10-CM: E03.9  ICD-9-CM: 244.9  08/29/2015 Yes        Essential hypertension ICD-10-CM: I10  ICD-9-CM: 401.9  08/29/2015 Yes        Frontotemporal dementia ([place]) ICD-10-CM: G31.09, F02.80  ICD-9-CM: 331.19  02/01/2015 Yes    Overview Signed 10/20/2017 10:25 AM by Wilfred Lacy, NP     Last Assessment & Plan:   Disinhibited, agitated, aggressive, violent at times. Will add depakote to try to calm her behaviors down. Might need to adjust namenda, aricept, and paxil, too but will start with depakote. Provided information about the drug, anticipated effects, and possible side effects. Family will call me next week and let me know how she is doing, we may need to increase the  dose, but since she is small, we will need to monitor for fall risk.     Daughter inquired about PACE program. She isnt medicaid eligible, I dont think, but out of pocket might be an option for daytime care. She is also going to look into senior day programs. I will refer her daughter to the [employer]z association for additional resources. Might need to send daughter REACH at next appt, too.              Depression with anxiety ICD-10-CM: F41.8  ICD-9-CM: 300.4  02/01/2015 Yes    Overview Signed 10/20/2017 10:25 AM by Wilfred Lacy, NP     Last Assessment & Plan:   History of depression, anxiety, and agitation. Currently taking Celexa 20 mg- for about 3-4 years. Tolerating well and denies ADEs. Plan: Continue Celexa 20 mg daily.  Asthma ICD-10-CM: [id number]  ICD-9-CM: 493.90  10/27/2012 Yes        GERD without esophagitis ICD-10-CM: K21.9  ICD-9-CM: 530.81  10/27/2012 Yes        Bladder cancer ([place]) ICD-10-CM: C67.9  ICD-9-CM: 188.9  09/20/2012 Yes              Signed By: Scherrie Bateman, NP     May 13, 2019

## 2019-05-13 NOTE — Progress Notes (Signed)
Problem: Mobility Impaired (Adult and Pediatric)  Goal: *Acute Goals and Plan of Care (Insert Text)  Outcome: Progressing Towards Goal  Note:   LTG:  (1.)Cathy Jackson will move from supine to sit and sit to supine , scoot up and down, and roll side to side with stand by assist within 7 treatment day(s).    (2.)Cathy Jackson will transfer from bed to chair and chair to bed with minimal assist using the least restrictive device within 7 treatment day(s).    (3.)Cathy Jackson will ambulate with minimal assist for 20 feet with the least restrictive device within 7 treatment day(s).   (4.)Cathy Jackson will perform seated lower extremity exercises for 15+ minutes to improve strength and mobility within 7 days.  ________________________________________________________________________________________________     PHYSICAL THERAPY: Daily Note and AM 05/13/2019  INPATIENT: PT Visit Days : 3  Payor: [employer]ET[employer] MEDICARE / Plan: [facility] MEDICARE ADVANTAGE / Product Type: Managed Care Medicare /       NAME/AGE/GENDER: Cathy Jackson is a 73 y.o. female   PRIMARY DIAGNOSIS: Closed left hip fracture (HCC) [S72.002A]  Low grade fever [R50.9] Closed left hip fracture (HCC) Closed left hip fracture (HCC)  Procedure(s) (LRB):  LEFT FEMORAL NECK SYSTEM (Left)  3 Days Post-Op  ICD-10: Treatment Diagnosis:    ?? Generalized Muscle Weakness (M62.81)  ?? Difficulty in walking, Not elsewhere classified (R26.2)  ?? Other abnormalities of gait and mobility (R26.89)  ?? History of falling (Z91.81)   Precaution/Allergies:  Patient has no known allergies.      ASSESSMENT:     Cathy Jackson is a 73 year old female with history of advanced frontotemporal dementia who is admitted from rehab facility with left hip fracture; seen s/p above surgery and is WBAT per ortho orders.   Pt presents supine in bed sleeping this morning.  Pt wakes easily, however requires much encouragement for out of bed activity.  Pt with only minimal  verbalizations this morning throughout session.  Pt performed supine to sit with mod assist.  Pt requiring a lot of increased time for all mobility this morning.  Pt stood with mod assist and ambulated 5' with rolling walker and min/mod assist to chair.  Pt does well once she gets moving.  Pt left up in chair with alarm on and needs in reach.  Slow progress this morning.  Will continue with POC.       This section established at most recent assessment   PROBLEM LIST (Impairments causing functional limitations):  1. Decreased Strength  2. Decreased Transfer Abilities  3. Decreased Ambulation Ability/Technique  4. Decreased Balance  5. Increased Pain  6. Decreased Activity Tolerance  7. Decreased Flexibility/Joint Mobility  8. Decreased Skin Integrity/Hygeine  9. Decreased Cognition   INTERVENTIONS PLANNED: (Benefits and precautions of physical therapy have been discussed with the patient.)  1. Balance Exercise  2. Bed Mobility  3. Gait Training  4. Home Exercise Program (HEP)  5. Therapeutic Activites  6. Therapeutic Exercise/Strengthening  7. Transfer Training     TREATMENT PLAN: Frequency/Duration: twice daily for duration of hospital stay  Rehabilitation Potential For Stated Goals: Good     REHAB RECOMMENDATIONS (at time of discharge pending progress):    Placement:  It is my opinion, based on this patient's performance to date, that Cathy Jackson may benefit from intensive therapy at a [facility] after discharge due to the functional deficits listed above that are likely to improve with skilled rehabilitation and concerns that  he/she may be unsafe to be unsupervised at home due to weakness, fall risk, unsteady gait, need for heavy assistance with mobility/transfers .  Equipment:   ? Tbd at rehab              HISTORY:   History of Present Injury/Illness (Reason for Referral):  PER H&P, "Patient is a 73 y/o female with severe frontotemporal dementia,  debility, bladder cancer, s/p ileal conduit, s/p nephro-ureteral tube placement 03/29/19, hypothyroid, GERD who suffered a fall today at a rehab facility. She was recently admitted 8/8-8/12 for ESBL Klebsiella UTI. Patient tried to stand but she could not. There was concern that she could have broken her hip. Xray does not show definite displaced fracture, but MRI reveals subcapital fracture of the left femoral neck. Bilateral edema like signal in the pubis bones may be a stress reaction or osteitis pubis. She also has a low grade temp of 100.2 without elevated WBC ct. Chest xray is clear. Her urine in bag is dark, concentrated and cloudy with sediment. UA with culture is pending. She will be admitted for further workup and treatment."    Past Medical History/Comorbidities:   Cathy Jackson  has a past medical history of Abdominal pain, unspecified site (10/27/2012), Arthritis, Asthma (10/27/2012), Bladder cancer (No Name), Depression, Dysuria (10/27/2012), Environmental allergies, GERD (gastroesophageal reflux disease) (10/27/2012), History of kidney stones, Hypothyroidism, Other "heavy-for-dates" infants, Unspecified disorder of bladder (10/27/2012), Urethral caruncle (10/27/2012), Urinary frequency (10/27/2012), and Urinary tract infection, site not specified (10/27/2012). She also has no past medical history of Difficult intubation, Malignant hyperthermia due to anesthesia, Nausea & vomiting, Pseudocholinesterase deficiency, or Unspecified adverse effect of anesthesia.  Cathy Jackson  has a past surgical history that includes hx breast biopsy (Left, 2000); hx cesarean section; hx tubal ligation; pr close cystostomy; hx wrist fracture tx (Left); hx cholecystectomy; hx nephrostomy (2014); ir exchange nephro perc rt si (10/23/2017); ir convert nephro perc rt to nephroureteral cath existing si (11/12/2017); ir change internal ureteral stent si (02/15/2018); ir change internal ureteral stent si (04/15/2018); ir change  internal ureteral stent si (06/14/2018); ir change uret tube via ileal conduit (09/07/2018); ir change uret tube via ileal conduit (12/20/2018); ir change uret tube via ileal conduit (03/21/2019); and ir nephroureteral perc rt plc cath new access  si (03/29/2019).  Social History/Living Environment:      Prior Level of Function/Work/Activity:  From rehab. Advanced dementia. Unclear prior level of function at rehab facility.     Number of Personal Factors/Comorbidities that affect the Plan of Care: 3+: HIGH COMPLEXITY   EXAMINATION:   Most Recent Physical Functioning:   Gross Assessment:                  Posture:     Balance:  Sitting - Static: Fair (occasional)(+)  Sitting - Dynamic: Fair (occasional)  Standing - Static: Fair  Standing - Dynamic : Fair;Constant support Bed Mobility:  Supine to Sit: Moderate assistance  Scooting: Moderate assistance;Maximum assistance  Wheelchair Mobility:     Transfers:  Sit to Stand: Moderate assistance  Stand to Sit: Minimum assistance  Gait:  Left Side Weight Bearing: As tolerated  Base of Support: Center of gravity altered;Narrowed  Speed/Cadence: Slow;Shuffled  Step Length: Left shortened;Right shortened  Gait Abnormalities: Decreased step clearance;Step to gait;Shuffling gait  Distance (ft): 5 Feet (ft)  Assistive Device: Walker, rolling  Ambulation - Level of Assistance: Minimal assistance;Moderate assistance  Interventions: Safety awareness training;Tactile cues;Verbal cues      Body Structures Involved:  1. Eyes and Ears  2. Bones  3. Joints  4. Muscles Body Functions Affected:  1. Mental  2. Sensory/Pain  3. Neuromusculoskeletal  4. Movement Related Activities and Participation Affected:  1. General Tasks and Demands  2. Mobility  3. Domestic Life  4. Community, Social and [employer]   Number of elements that affect the Plan of Care: 4+: HIGH COMPLEXITY   CLINICAL PRESENTATION:   Presentation: Evolving clinical presentation with changing clinical  characteristics: MODERATE COMPLEXITY   CLINICAL DECISION MAKING:   [place]??? ???6 Clicks???   Basic Mobility Inpatient Short Form  How much difficulty does the patient currently have... Unable A Lot A Little None   1.  Turning over in bed (including adjusting bedclothes, sheets and blankets)?   []  1   []  2   [x]  3   []  4   2.  Sitting down on and standing up from a chair with arms ( e.g., wheelchair, bedside commode, etc.)   []  1   [x]  2   []  3   []  4   3.  Moving from lying on back to sitting on the side of the bed?   []  1   []  2   [x]  3   []  4   How much help from another person does the patient currently need... Total A Lot A Little None   4.  Moving to and from a bed to a chair (including a wheelchair)?   []  1   [x]  2   []  3   []  4   5.  Need to walk in hospital room?   []  1   [x]  2   []  3   []  4   6.  Climbing 3-5 steps with a railing?   [x]  1   []  2   []  3   []  4   ?? 2007, Trustees of [employer], under license to [employer]. All rights reserved      Score:  Initial: 13 Most Recent: X (Date: -- )    Interpretation of Tool:  Represents activities that are increasingly more difficult (i.e. Bed mobility, Transfers, Gait).    Medical Necessity:     ?? Patient demonstrates   ?? fair  ??  rehab potential due to higher previous functional level.  Reason for Services/Other Comments:  ?? Patient   ?? continues to demonstrate capacity to improve strength, mobility, balance, transfers, and activity tolerance which will   ?? increase independence, decrease amount of assistance required from caregiver, and increase safety  ?? .   Use of outcome tool(s) and clinical judgement create a POC that gives a: Questionable prediction of patient's progress: MODERATE COMPLEXITY            TREATMENT:   (In addition to Assessment/Re-Assessment sessions the following treatments were rendered)   Pre-treatment Symptoms/Complaints:  pt still alert, not as interactive this afternoon  Pain: Initial:      Post Session:  0/10      Therapeutic Activity: (   42 minutes):  Therapeutic activities including Bed transfers, Chair transfers, standing balance, and Ambulation on level ground to improve mobility, strength, balance, coordination, and activity tolerance .  Required minimal to moderate assist and heavy Safety awareness training;Tactile cues;Verbal cues to promote static and dynamic balance in standing and promote motor control of bilateral, lower extremity(s).     Therapeutic Exercise: (  0 minutes):  Exercises per grid below to improve mobility and strength.  Required mod visual, verbal and manual cues to promote proper body alignment and promote proper body posture.  Progressed range and repetitions as indicated.     Date:  05/12/19 Date:   Date:     ACTIVITY/EXERCISE AM PM AM PM AM PM   Ambulation:           Distance  Device  Duration         Seated Heel Raises X 15 B AA        Seated Toe Raises X 15 B AA        Seated Long Arc Quads X 15 B AA        Seated Marching X 15 B AA        Seated Hip Abduction X 15 B AA                 B = bilateral; AA = active assistive; A = active; P = passive        Braces/Orthotics/Lines/Etc:   ?? IV  ?? O2 Device: Room air  Treatment/Session Assessment:    ?? Response to Treatment:  Improved transfers/ambulation this afternoon.  ?? Interdisciplinary Collaboration:   o Physical Therapy Assistant  o Registered Nurse  ?? After treatment position/precautions:   o Up in chair  o Bed alarm/tab alert on  o Bed/Chair-wheels locked  o Bed in low position  o Call light within reach  o RN notified   ?? Compliance with Program/Exercises: Will assess as treatment progresses  ?? Recommendations/Intent for next treatment session:  "Next visit will focus on advancements to more challenging activities and reduction in assistance provided".  Total Treatment Duration:  PT Patient Time In/Time Out  Time In: 1016  Time Out: [address] PTA

## 2019-05-13 NOTE — Progress Notes (Signed)
Hospitalist Progress Note    05/13/2019  Admit Date: 05/09/2019  5:26 PM   NAME: Cathy Jackson   DOB:  11-30-1945   MRN:  [id number]   Attending: Truitt Merle, DO  PCP:  Wilfred Lacy, NP    SUBJECTIVE:   73 y/o female with severe frontotemporal dementia, debility, bladder cancer, s/p ileal conduit, s/p nephro-ureteral tube placement 03/29/19, hypothyroid, GERD who suffered a fall at a rehab facility. She was recently admitted 8/8-8/12 for ESBL Klebsiella UTI. Patient tried to stand but she could not. There was concern that she could have broken her hip. Xray does not show definite displaced fracture, but MRI reveals subcapital fracture of the left femoral neck. UA pos for UTI. Started on Merem given recent ESBL infection.     Patient is s/p hip fracture repair, POD 3. She remains aphasic and did not answer my questions and says want to sleep. Nurse reported pt is not eating or drinking. Ileostomy bag is changed today because of leaking. Physical therapy on board.       Review of Systems negative with exception of pertinent positives noted above  PHYSICAL EXAM     Visit Vitals  BP 129/64 (BP 1 Location: Left arm, BP Patient Position: At rest)   Pulse 81   Temp 98.2 ??F (36.8 ??C)   Resp 18   Ht 5\' 2"  (1.575 m)   Wt 49.9 kg (110 lb)   LMP  (LMP Unknown)   SpO2 90%   BMI 20.12 kg/m??      Temp (24hrs), Avg:97.8 ??F (36.6 ??C), Min:97.4 ??F (36.3 ??C), Max:98.2 ??F (36.8 ??C)    Oxygen Therapy  O2 Sat (%): 90 % (05/13/19 0808)  Pulse via Oximetry: 99 beats per minute (05/10/19 1520)  O2 Device: Room air (05/11/19 1332)  O2 Flow Rate (L/min): 3 l/min (05/10/19 1510)    Intake/Output Summary (Last 24 hours) at 05/13/2019 0810  Last data filed at 05/13/2019 0718  Gross per 24 hour   Intake 240 ml   Output 300 ml   Net -60 ml      General: No acute distress????aphasic, very weak  Lungs:  CTA Bilaterally.   Heart:  Regular rate and rhythm,?? No murmur, rub, or gallop   Abdomen: Soft, Non distended, Non tender, Positive bowel sounds. Ileostomy bag in place  Extremities: No cyanosis, clubbing or edema, s/p left hip fracture repair, dressing in place  Neurologic:?? No focal deficits    ASSESSMENT      Active Hospital Problems    Diagnosis Date Noted   ??? Closed left hip fracture ([place]) 05/09/2019   ??? Low grade fever 05/09/2019   ??? Dehydration 05/09/2019   ??? Acquired hypothyroidism 08/29/2015   ??? Essential hypertension 08/29/2015   ??? Depression with anxiety 02/01/2015     Last Assessment & Plan:   History of depression, anxiety, and agitation. Currently taking Celexa 20 mg- for about 3-4 years. Tolerating well and denies ADEs. Plan: Continue Celexa 20 mg daily.      ??? Frontotemporal dementia ([place]) 02/01/2015     Last Assessment & Plan:   Disinhibited, agitated, aggressive, violent at times. Will add depakote to try to calm her behaviors down. Might need to adjust namenda, aricept, and paxil, too but will start with depakote. Provided information about the drug, anticipated effects, and possible side effects. Family will call me next week and let me know how she is doing, we may need to increase the dose, but since  she is small, we will need to monitor for fall risk.     Daughter inquired about PACE program. She isnt medicaid eligible, I dont think, but out of pocket might be an option for daytime care. She is also going to look into senior day programs. I will refer her daughter to the [facility]z association for additional resources. Might need to send daughter REACH at next appt, too.      ??? Asthma 10/27/2012   ??? GERD without esophagitis 10/27/2012   ??? Bladder cancer ([place]) 09/20/2012     A/p    Generalized weakness/Dehydration:  Pt is not eating or drinking  Asked the nurse for eating assessment  Repeat labs today  Given severe dementia pt is not answering questions and following commands, looks like this is her baseline  IVF @ maintenance rate  No family member at bedside   Will try to involve family in her care     Left hip fracture s/p surgical repair, POD 3  Px mgmt and dvt prophy as per ortho  Physical therapy     UTI - with hx of ESBL:    Follow for final cultures.   Cont merem D5    Severe dementia - aware:  Behavior stable. Cont home meds (trazadone/ paxil)    ETHIC: Pt is full code. Will involve palliative care top discuss goals of care     DVT Prophylaxis: asa 325mg  daily/ SCD's as per ortho    Signed By: Malachi Pro, MD     May 13, 2019

## 2019-05-13 NOTE — Progress Notes (Signed)
More alert this PM  Hydrophobia  Will not eat  IVVF  NSS @ 50/hr

## 2019-05-13 NOTE — Progress Notes (Signed)
Chaplain initial visit, met with patient at bedside, offered support, prayer. Patient is somewhat confused this afternoon.    Cathy Jackson  Chaplain PRN

## 2019-05-14 LAB — METABOLIC PANEL, BASIC
Anion gap: 10 mmol/L (ref 7–16)
BUN: 15 MG/DL (ref 8–23)
CO2: 23 mmol/L (ref 21–32)
Calcium: 8.2 MG/DL — ABNORMAL LOW (ref 8.3–10.4)
Chloride: 110 mmol/L — ABNORMAL HIGH (ref 98–107)
Creatinine: 0.55 MG/DL — ABNORMAL LOW (ref 0.6–1.0)
GFR est AA: >60 mL/min/{1.73_m2} (ref >60)
GFR est non-AA: >60 mL/min/{1.73_m2} (ref >60)
Glucose: 84 mg/dL (ref 65–100)
Potassium: 3.2 mmol/L — ABNORMAL LOW (ref 3.5–5.1)
Sodium: 143 mmol/L (ref 136–145)

## 2019-05-14 LAB — CBC WITH AUTOMATED DIFF
ABS. BASOPHILS: 0 10*3/uL (ref 0.0–0.2)
ABS. EOSINOPHILS: 0.3 10*3/uL (ref 0.0–0.8)
ABS. IMM. GRANS.: 0 10*3/uL (ref 0.0–0.5)
ABS. LYMPHOCYTES: 1.7 10*3/uL (ref 0.5–4.6)
ABS. MONOCYTES: 0.5 10*3/uL (ref 0.1–1.3)
ABS. NEUTROPHILS: 2.3 10*3/uL (ref 1.7–8.2)
ABSOLUTE NRBC: 0 10*3/uL (ref 0.0–0.2)
BASOPHILS: 0 % (ref 0.0–2.0)
EOSINOPHILS: 7 % (ref 0.5–7.8)
HCT: 30 % — ABNORMAL LOW (ref 35.8–46.3)
HGB: 9.6 g/dL — ABNORMAL LOW (ref 11.7–15.4)
IMMATURE GRANULOCYTES: 0 % (ref 0.0–5.0)
LYMPHOCYTES: 35 % (ref 13–44)
MCH: 30.8 PG (ref 26.1–32.9)
MCHC: 32 g/dL (ref 31.4–35.0)
MCV: 96.2 FL (ref 79.6–97.8)
MONOCYTES: 11 % (ref 4.0–12.0)
MPV: 9.7 FL (ref 9.4–12.3)
NEUTROPHILS: 47 % (ref 43–78)
PLATELET: 212 10*3/uL (ref 150–450)
RBC: 3.12 M/uL — ABNORMAL LOW (ref 4.05–5.2)
RDW: 13.7 % (ref 11.9–14.6)
WBC: 4.9 10*3/uL (ref 4.3–11.1)

## 2019-05-14 LAB — CULTURE, BLOOD: Culture result:: NO GROWTH

## 2019-05-14 MED ORDER — POTASSIUM CHLORIDE SR 20 MEQ TAB, PARTICLES/CRYSTALS
20 mEq | ORAL | Status: AC
Start: 2019-05-14 — End: 2019-05-14
  Administered 2019-05-14: 13:00:00 via ORAL

## 2019-05-14 MED FILL — CALCIUM 500 + D 500 MG-5 MCG (200 UNIT) TABLET: 500 mg-5 mcg (200 unit) | ORAL | Qty: 1

## 2019-05-14 MED FILL — MAPAP (ACETAMINOPHEN) 325 MG TABLET: 325 mg | ORAL | Qty: 2

## 2019-05-14 MED FILL — PAROXETINE 20 MG TAB: 20 mg | ORAL | Qty: 1

## 2019-05-14 MED FILL — TRAZODONE 50 MG TAB: 50 mg | ORAL | Qty: 1

## 2019-05-14 MED FILL — OYSTER SHELL CALCIUM 500  500 MG CALCIUM (1,250 MG) TABLET: 500 mg calcium (1,250 mg) | ORAL | Qty: 1

## 2019-05-14 MED FILL — POTASSIUM CHLORIDE SR 20 MEQ TAB, PARTICLES/CRYSTALS: 20 mEq | ORAL | Qty: 2

## 2019-05-14 MED FILL — STOOL SOFTENER-STIMULANT LAXATIVE 8.6 MG-50 MG TABLET: ORAL | Qty: 1

## 2019-05-14 MED FILL — ASPIRIN 325 MG TAB: 325 mg | ORAL | Qty: 1

## 2019-05-14 MED FILL — FERROUS SULFATE 325 MG (65 MG ELEMENTAL IRON) TAB: 325 mg (65 mg iron) | ORAL | Qty: 1

## 2019-05-14 MED FILL — ATORVASTATIN 10 MG TAB: 10 mg | ORAL | Qty: 1

## 2019-05-14 MED FILL — HYDROCODONE-ACETAMINOPHEN 5 MG-325 MG TAB: 5-325 mg | ORAL | Qty: 1

## 2019-05-14 MED FILL — VITAMIN C 500 MG TABLET: 500 mg | ORAL | Qty: 2

## 2019-05-14 NOTE — Progress Notes (Signed)
Problem: Mobility Impaired (Adult and Pediatric)  Goal: *Acute Goals and Plan of Care (Insert Text)  Outcome: Progressing Towards Goal  Note:   LTG:  (1.)Ms. Vincente will move from supine to sit and sit to supine , scoot up and down, and roll side to side with stand by assist within 7 treatment day(s).    (2.)Ms. Lopez will transfer from bed to chair and chair to bed with minimal assist using the least restrictive device within 7 treatment day(s).    (3.)Ms. Blouin will ambulate with minimal assist for 20 feet with the least restrictive device within 7 treatment day(s).   (4.)Ms. Unsworth will perform seated lower extremity exercises for 15+ minutes to improve strength and mobility within 7 days.  ________________________________________________________________________________________________     PHYSICAL THERAPY: Daily Note and AM 05/14/2019  INPATIENT: PT Visit Days : 4  Payor: [employer]ET[employer] MEDICARE / Plan: [facility] AET[employer] MEDICARE ADVANTAGE / Product Type: Managed Care Medicare /       NAME/AGE/GENDER: Xariyah Chao is a 73 y.o. female   PRIMARY DIAGNOSIS: Closed left hip fracture (HCC) [S72.002A]  Low grade fever [R50.9] Closed left hip fracture (HCC) Closed left hip fracture (HCC)  Procedure(s) (LRB):  LEFT FEMORAL NECK SYSTEM (Left)  4 Days Post-Op  ICD-10: Treatment Diagnosis:    ?? Generalized Muscle Weakness (M62.81)  ?? Difficulty in walking, Not elsewhere classified (R26.2)  ?? Other abnormalities of gait and mobility (R26.89)  ?? History of falling (Z91.81)   Precaution/Allergies:  Patient has no known allergies.      ASSESSMENT:     Ms. Nakada is a 73 year old female with history of advanced frontotemporal dementia who is admitted from rehab facility with left hip fracture; seen s/p above surgery and is WBAT per ortho orders.   Patient woke about 11 am and was looking to get rid of the lap belt.  She was very sweet and thankful I was letting her out of her strap.  She got  herself to the EOB without help and was able to scoot herself to the edge.  She has no idea why she is in the hospital and has no pain she can verbalize.  She walked into the bathroom and worked on cleaning herself fairly well.  She washed her hands at the sink then walked to the door and over to chair with CGA/Min A.  She forgets the walker and needs constant assistance with this.  Left up in the chair although she only last less than 30 minutes.  Fair mobility but poor cognition.    This section established at most recent assessment   PROBLEM LIST (Impairments causing functional limitations):  1. Decreased Strength  2. Decreased Transfer Abilities  3. Decreased Ambulation Ability/Technique  4. Decreased Balance  5. Increased Pain  6. Decreased Activity Tolerance  7. Decreased Flexibility/Joint Mobility  8. Decreased Skin Integrity/Hygeine  9. Decreased Cognition   INTERVENTIONS PLANNED: (Benefits and precautions of physical therapy have been discussed with the patient.)  1. Balance Exercise  2. Bed Mobility  3. Gait Training  4. Home Exercise Program (HEP)  5. Therapeutic Activites  6. Therapeutic Exercise/Strengthening  7. Transfer Training     TREATMENT PLAN: Frequency/Duration: twice daily for duration of hospital stay  Rehabilitation Potential For Stated Goals: Good     REHAB RECOMMENDATIONS (at time of discharge pending progress):    Placement:  It is my opinion, based on this patient's performance to date, that Ms. Schaben may benefit from intensive  therapy at a [facility] after discharge due to the functional deficits listed above that are likely to improve with skilled rehabilitation and concerns that he/she may be unsafe to be unsupervised at home due to weakness, fall risk, unsteady gait, need for heavy assistance with mobility/transfers .  Equipment:   ? Tbd at rehab              HISTORY:   History of Present Injury/Illness (Reason for Referral):   PER H&P, "Patient is a 73 y/o female with severe frontotemporal dementia, debility, bladder cancer, s/p ileal conduit, s/p nephro-ureteral tube placement 03/29/19, hypothyroid, GERD who suffered a fall today at a rehab facility. She was recently admitted 8/8-8/12 for ESBL Klebsiella UTI. Patient tried to stand but she could not. There was concern that she could have broken her hip. Xray does not show definite displaced fracture, but MRI reveals subcapital fracture of the left femoral neck. Bilateral edema like signal in the pubis bones may be a stress reaction or osteitis pubis. She also has a low grade temp of 100.2 without elevated WBC ct. Chest xray is clear. Her urine in bag is dark, concentrated and cloudy with sediment. UA with culture is pending. She will be admitted for further workup and treatment."    Past Medical History/Comorbidities:   Ms. Giesel  has a past medical history of Abdominal pain, unspecified site (10/27/2012), Arthritis, Asthma (10/27/2012), Bladder cancer ([place]), Depression, Dysuria (10/27/2012), Environmental allergies, GERD (gastroesophageal reflux disease) (10/27/2012), History of kidney stones, Hypothyroidism, Other "heavy-for-dates" infants, Unspecified disorder of bladder (10/27/2012), Urethral caruncle (10/27/2012), Urinary frequency (10/27/2012), and Urinary tract infection, site not specified (10/27/2012). She also has no past medical history of Difficult intubation, Malignant hyperthermia due to anesthesia, Nausea & vomiting, Pseudocholinesterase deficiency, or Unspecified adverse effect of anesthesia.  Ms. Corban  has a past surgical history that includes hx breast biopsy (Left, 2000); hx cesarean section; hx tubal ligation; pr close cystostomy; hx wrist fracture tx (Left); hx cholecystectomy; hx nephrostomy (2014); ir exchange nephro perc rt si (10/23/2017); ir convert nephro perc rt to nephroureteral cath existing si (11/12/2017); ir change internal ureteral stent si  (02/15/2018); ir change internal ureteral stent si (04/15/2018); ir change internal ureteral stent si (06/14/2018); ir change uret tube via ileal conduit (09/07/2018); ir change uret tube via ileal conduit (12/20/2018); ir change uret tube via ileal conduit (03/21/2019); and ir nephroureteral perc rt plc cath new access  si (03/29/2019).  Social History/Living Environment:      Prior Level of Function/Work/Activity:  From rehab. Advanced dementia. Unclear prior level of function at rehab facility.     Number of Personal Factors/Comorbidities that affect the Plan of Care: 3+: HIGH COMPLEXITY   EXAMINATION:   Most Recent Physical Functioning:   Gross Assessment:                  Posture:     Balance:    Bed Mobility:  Supine to Sit: Modified independent  Scooting: Stand-by assistance  Wheelchair Mobility:     Transfers:  Sit to Stand: Contact guard assistance  Stand to Sit: Contact guard assistance;Minimum assistance  Gait:     Speed/Cadence: Shuffled;Slow  Step Length: Left shortened;Right shortened  Distance (ft): 30 Feet (ft)  Assistive Device: Walker, rolling  Ambulation - Level of Assistance: Minimal assistance;Moderate assistance      Body Structures Involved:  1. Eyes and Ears  2. Bones  3. Joints  4. Muscles Body Functions Affected:  1. Mental  2. Sensory/Pain  3. Neuromusculoskeletal  4. Movement Related Activities and Participation Affected:  1. General Tasks and Demands  2. Mobility  3. Domestic Life  4. Community, Social and [employer]   Number of elements that affect the Plan of Care: 4+: HIGH COMPLEXITY   CLINICAL PRESENTATION:   Presentation: Evolving clinical presentation with changing clinical characteristics: MODERATE COMPLEXITY   CLINICAL DECISION MAKING:   [place]??? ???6 Clicks???   Basic Mobility Inpatient Short Form  How much difficulty does the patient currently have... Unable A Lot A Little None   1.  Turning over in bed (including adjusting bedclothes, sheets and  blankets)?   []  1   []  2   [x]  3   []  4   2.  Sitting down on and standing up from a chair with arms ( e.g., wheelchair, bedside commode, etc.)   []  1   [x]  2   []  3   []  4   3.  Moving from lying on back to sitting on the side of the bed?   []  1   []  2   [x]  3   []  4   How much help from another person does the patient currently need... Total A Lot A Little None   4.  Moving to and from a bed to a chair (including a wheelchair)?   []  1   [x]  2   []  3   []  4   5.  Need to walk in hospital room?   []  1   [x]  2   []  3   []  4   6.  Climbing 3-5 steps with a railing?   [x]  1   []  2   []  3   []  4   ?? 2007, [employer], under license to [employer]. All rights reserved      Score:  Initial: 13 Most Recent: X (Date: -- )    Interpretation of Tool:  Represents activities that are increasingly more difficult (i.e. Bed mobility, Transfers, Gait).    Medical Necessity:     ?? Patient demonstrates   ?? fair  ??  rehab potential due to higher previous functional level.  Reason for Services/Other Comments:  ?? Patient   ?? continues to demonstrate capacity to improve strength, mobility, balance, transfers, and activity tolerance which will   ?? increase independence, decrease amount of assistance required from caregiver, and increase safety  ?? .   Use of outcome tool(s) and clinical judgement create a POC that gives a: Questionable prediction of patient's progress: MODERATE COMPLEXITY            TREATMENT:   (In addition to Assessment/Re-Assessment sessions the following treatments were rendered)   Pre-treatment Symptoms/Complaints:  pt alert   Pain: Initial:   Pain Intensity 1: 0  Post Session:  0/10     Therapeutic Activity: (    30 minutes):  Therapeutic activities including Bed transfers, Chair transfers, standing balance, and Ambulation on level ground to improve mobility, strength, balance, coordination, and activity tolerance .  Required minimal to moderate assist and  to promote static  and dynamic balance in standing and promote motor control of bilateral, lower extremity(s).        Date:  05/12/19 Date:   Date:     ACTIVITY/EXERCISE AM PM AM PM AM PM   Ambulation:           Distance  Device  Duration  Seated Heel Raises X 15 B AA        Seated Toe Raises X 15 B AA        Seated Long Arc Quads X 15 B AA        Seated Marching X 15 B AA        Seated Hip Abduction X 15 B AA                 B = bilateral; AA = active assistive; A = active; P = passive    Braces/Orthotics/Lines/Etc:   ?? urostomy  Treatment/Session Assessment:    ?? Response to Treatment:  Improved transfers/ambulation this afternoon.  ?? Interdisciplinary Collaboration:   o Physical Therapy Assistant  o Registered Nurse  ?? After treatment position/precautions:   o Up in chair  o Bed alarm/tab alert on  o Bed/Chair-wheels locked  o Bed in low position  o Call light within reach  o RN notified  o lap belt   ?? Compliance with Program/Exercises: Compliant all of the time  ?? Recommendations/Intent for next treatment session:  "Next visit will focus on advancements to more challenging activities and reduction in assistance provided".  Total Treatment Duration:  PT Patient Time In/Time Out  Time In: 1110  Time Out: [address], PTA

## 2019-05-14 NOTE — Progress Notes (Signed)
Will not eat or drink  Refuses PO.  Continuing IVF for hydration.  Confused mostly  Ambulates a few feet to BRP w max assist

## 2019-05-14 NOTE — Progress Notes (Signed)
ORTH FRACTURE PROGRESS NOTE    May 14, 2019  Admit Date:   05/09/2019    Post Op day: 4 Days Post-Op    Subjective:    Cathy Jackson PATIENT LYING IN BED; NURSE AT BEDSIDE     PT/OT:   Gait:  Gait  Base of Support: Center of gravity altered  Speed/Cadence: Slow, Shuffled  Step Length: Right shortened, Left shortened  Gait Abnormalities: Decreased step clearance, Shuffling gait, Step to gait  Ambulation - Level of Assistance: Minimal assistance, Moderate assistance  Distance (ft): 50 Feet (ft)  Assistive Device: Walker, rolling  Interventions: Safety awareness training, Verbal cues, Tactile cues, Manual cues            Interventions: Safety awareness training, Verbal cues, Tactile cues, Manual cues    Vital Signs:    Patient Vitals for the past 8 hrs:   BP Temp Pulse Resp SpO2   05/14/19 0824 127/71 97.7 ??F (36.5 ??C) 86 18 97 %   05/14/19 0541 127/82 98.1 ??F (36.7 ??C) 85 18 96 %     Temp (24hrs), Avg:97.9 ??F (36.6 ??C), Min:97.4 ??F (36.3 ??C), Max:98.5 ??F (36.9 ??C)      Pain Control:   Pain Assessment  Pain Scale 1: FLACC  Pain Intensity 1: 0  Pain Location 1: Other (comment)("in my bowels")    Meds:    Current Facility-Administered Medications   Medication Dose Route Frequency   ??? traZODone (DESYREL) tablet 50 mg  50 mg Oral QHS   ??? atorvastatin (LIPITOR) tablet 10 mg  10 mg Oral QHS   ??? senna-docusate (PERICOLACE) 8.6-50 mg per tablet 1 Tab  1 Tab Oral DAILY   ??? PARoxetine (PAXIL) tablet 20 mg  20 mg Oral DAILY   ??? ferrous sulfate tablet 325 mg  1 Tab Oral ACB   ??? calcium carbonate (OS-CAL) tablet 500 mg [elemental]  500 mg Oral DAILY   ??? ascorbic acid (vitamin C) (VITAMIN C) tablet 1,000 mg  1,000 mg Oral DAILY   ??? sodium chloride (NS) flush 5-40 mL  5-40 mL IntraVENous Q8H   ??? sodium chloride (NS) flush 5-40 mL  5-40 mL IntraVENous PRN   ??? acetaminophen (TYLENOL) tablet 650 mg  650 mg Oral Q8H   ??? HYDROcodone-acetaminophen (NORCO) 5-325 mg per tablet 1 Tab  1 Tab Oral Q4H PRN    ??? ondansetron (ZOFRAN) injection 4 mg  4 mg IntraVENous Q4H PRN   ??? lidocaine (XYLOCAINE) 10 mg/mL (1 %) injection 0.1 mL  0.1 mL SubCUTAneous PRN   ??? sodium chloride (NS) flush 5-40 mL  5-40 mL IntraVENous Q8H   ??? sodium chloride (NS) flush 5-40 mL  5-40 mL IntraVENous PRN   ??? alum-mag hydroxide-simeth (MYLANTA) oral suspension 30 mL  30 mL Oral Q4H PRN   ??? calcium-vitamin D (OS-CAL) 500 mg-200 unit tablet  1 Tab Oral TID WITH MEALS   ??? aspirin tablet 325 mg  325 mg Oral DAILY   ??? 0.9% sodium chloride infusion  50 mL/hr IntraVENous CONTINUOUS       LAB:    Recent Labs     05/13/19  0945   HCT 31.5*   HGB 10.2*       24 Hour Assessment Issues:    Disoriented (baseline)    Discharge Planning: SNF    Transfuse PRBC's:      Assessment & Physician's Comment:  Dressing is clean, dry, and intact  Neurovascular checks within normal limits    Principal  Problem:    Closed left hip fracture ([place]) (05/09/2019)    Active Problems:    Bladder cancer ([place]) (09/20/2012)      Asthma (10/27/2012)      GERD without esophagitis (10/27/2012)      Acquired hypothyroidism (08/29/2015)      Essential hypertension (08/29/2015)      Frontotemporal dementia ([place]) (02/01/2015)      Overview: Last Assessment & Plan:       Disinhibited, agitated, aggressive, violent at times. Will add depakote to       try to calm her behaviors down. Might need to adjust namenda, aricept, and       paxil, too but will start with depakote. Provided information about the       drug, anticipated effects, and possible side effects. Family will call me       next week and let me know how she is doing, we may need to increase the       dose, but since she is small, we will need to monitor for fall risk.             Daughter inquired about PACE program. She isnt medicaid eligible, I dont       think, but out of pocket might be an option for daytime care. She is also       going to look into senior day programs. I will refer her daughter to the        [facility] for additional resources. Might need to send daughter       REACH at next appt, too.       Depression with anxiety (02/01/2015)      Overview: Last Assessment & Plan:       History of depression, anxiety, and agitation. Currently taking Celexa 20       mg- for about 3-4 years. Tolerating well and denies ADEs. Plan: Continue       Celexa 20 mg daily.       Low grade fever (05/09/2019)      Dehydration (05/09/2019)        Plan:  [place] TO SNF FOR REHAB   FOLLOW-UP WITH DR Covington, NP

## 2019-05-14 NOTE — Progress Notes (Signed)
Problem: Self Care Deficits Care Plan (Adult)  Goal: *Acute Goals and Plan of Care (Insert Text)  Outcome: Progressing Towards Goal  Note: 1. Pt will toilet with mod A   2. Pt will complete functional mobility for ADLs with min A  3. Pt will complete lower body dressing with mod A using AE as needed  4. Pt will complete grooming and hygiene with set up  5. Pt will complete HEP with min cues to promote increased BUE strength and functional use for ADLs  6. Pt will maintain standing balance for ADLs with CGA  7. Pt will complete bed mobility with CGA in prep for ADLs    Timeframe: 7 days       OCCUPATIONAL THERAPY: Daily Note and PM    05/14/2019  INPATIENT: OT Visit Days: 4  Payor: [employer] MEDICARE / Plan: [employer] MEDICARE ADVANTAGE / Product Type: Managed Care Medicare /      NAME/AGE/GENDER: Cathy Jackson is a 73 y.o. female   PRIMARY DIAGNOSIS:  Closed left hip fracture (HCC) [S72.002A]  Low grade fever [R50.9] Closed left hip fracture (HCC) Closed left hip fracture (HCC)  Procedure(s) (LRB):  LEFT FEMORAL NECK SYSTEM (Left)  4 Days Post-Op  ICD-10: Treatment Diagnosis:    ?? History of falling (Z91.81)   Precautions/Allergies:    WBAT Patient has no known allergies.      ASSESSMENT:     Cathy Jackson presents with L hip fx d/t falling at rehab, WBAT. Pt presented A&O to person only, minimally verbal, unable to provide detailed PLOF information but did indicate that she required some assistance for bathing, dressing, and toileting. Pt presented with deficits in mobility, balance, endurance, strength, and cognition impacting ADLs.     05/14/2019 Pt presents in supine upon arrival. Pt transferred to sitting with CGA/min a and additional time. Pt sat edge of bed with fair balance and stood with CGA and rolling walker from the bed. Pt ambulated to the bathroom with min a for walker navigation and required max a once in the bathroom d/t confined space. Pt completed toileting with min a and  clothing management with min a. Pt was worn out and needed a rest break and then completed hand hygiene with min a for thoroughness. Pt wanted to complete further ambulation, but explained to patient that she was to weak. Pt was assisted back to bed with CGA and left with bed positioned as found and bed alarm on. Pt is very confused. Will continue therapeutic efforts.    This section established at most recent assessment   PROBLEM LIST (Impairments causing functional limitations):  1. Decreased Strength  2. Decreased ADL/Functional Activities  3. Decreased Transfer Abilities  4. Decreased Balance  5. Decreased Cognition   INTERVENTIONS PLANNED: (Benefits and precautions of occupational therapy have been discussed with the patient.)  1. Activities of daily living training  2. Adaptive equipment training  3. Balance training  4. Therapeutic activity  5. Therapeutic exercise     TREATMENT PLAN: Frequency/Duration: Follow patient 3 times/ week to address above goals.  Rehabilitation Potential For Stated Goals: [place] (at time of discharge pending progress):    Placement:  It is my opinion, based on this patient's performance to date, that Cathy Jackson may benefit from intensive therapy at a [facility] after discharge due to the functional deficits listed above that are likely to improve with skilled rehabilitation and concerns that he/she may be unsafe  to be unsupervised at home due to fall risk, inability to mobilize .  Equipment:   ? None at this time              [place]:   History of Present Injury/Illness (Reason for Referral):  See H&P  Past Medical History/Comorbidities:   Cathy Jackson  has a past medical history of Abdominal pain, unspecified site (10/27/2012), Arthritis, Asthma (10/27/2012), Bladder cancer ([place]), Depression, Dysuria (10/27/2012), Environmental allergies, GERD (gastroesophageal reflux disease) (10/27/2012), History of kidney stones,  Hypothyroidism, Other "heavy-for-dates" infants, Unspecified disorder of bladder (10/27/2012), Urethral caruncle (10/27/2012), Urinary frequency (10/27/2012), and Urinary tract infection, site not specified (10/27/2012). She also has no past medical history of Difficult intubation, Malignant hyperthermia due to anesthesia, Nausea & vomiting, Pseudocholinesterase deficiency, or Unspecified adverse effect of anesthesia.  Cathy Jackson  has a past surgical history that includes hx breast biopsy (Left, 2000); hx cesarean section; hx tubal ligation; pr close cystostomy; hx wrist fracture tx (Left); hx cholecystectomy; hx nephrostomy (2014); ir exchange nephro perc rt si (10/23/2017); ir convert nephro perc rt to nephroureteral cath existing si (11/12/2017); ir change internal ureteral stent si (02/15/2018); ir change internal ureteral stent si (04/15/2018); ir change internal ureteral stent si (06/14/2018); ir change uret tube via ileal conduit (09/07/2018); ir change uret tube via ileal conduit (12/20/2018); ir change uret tube via ileal conduit (03/21/2019); and ir nephroureteral perc rt plc cath new access  si (03/29/2019).  Social History/Living Environment:    unknown  Prior Level of Function/Work/Activity:  See assessment      Number of Personal Factors/Comorbidities that affect the Plan of Care: Expanded review of therapy/medical records (1-2):  MODERATE COMPLEXITY   ASSESSMENT OF OCCUPATIONAL PERFORMANCE::   Activities of Daily Living:   Basic ADLs (From Assessment) Complex ADLs (From Assessment)   Feeding: Setup  Oral Facial Hygiene/Grooming: Minimum assistance  Bathing: Maximum assistance  Upper Body Dressing: Moderate assistance  Lower Body Dressing: Total assistance  Toileting: Maximum assistance, Total assistance Instrumental ADL  Meal Preparation: Total assistance  Homemaking: Total assistance   Grooming/Bathing/Dressing Activities of Daily Living   Grooming  Washing Hands: Minimum assistance(for thoroughness)              Toileting  Toileting Assistance: Stand-by assistance;Contact guard assistance  Bladder Hygiene: Contact guard assistance  Bowel Hygiene: Minimum assistance  Clothing Management: Minimum assistance     Functional Transfers  Bathroom Mobility: Moderate assistance;Maximum assistance  Toilet Transfer : Moderate assistance     Bed/Mat Mobility  Supine to Sit: Contact guard assistance;Minimum assistance  Sit to Supine: Contact guard assistance  Sit to Stand: Contact guard assistance;Minimum assistance  Stand to Sit: Contact guard assistance;Minimum assistance  Scooting: Stand-by assistance     Most Recent Physical Functioning:   Gross Assessment:                  Posture:  Posture (WDL): Exceptions to WDL  Posture Assessment: Forward head, Rounded shoulders, Trunk flexion  Balance:  Sitting: Impaired  Sitting - Static: Good (unsupported)  Sitting - Dynamic: Fair (occasional)  Standing: Impaired  Standing - Static: Constant support;Fair  Standing - Dynamic : Constant support;Fair Bed Mobility:  Supine to Sit: Contact guard assistance;Minimum assistance  Sit to Supine: Contact guard assistance  Scooting: Stand-by assistance  Wheelchair Mobility:     Transfers:  Sit to Stand: Contact guard assistance;Minimum assistance  Stand to Sit: Contact guard assistance;Minimum assistance  Patient Vitals for the past 6 hrs:   BP BP Patient Position SpO2 Pulse   05/14/19 0824 127/71 At rest 97 % 86   05/14/19 1137 108/71 At rest 98 % 100       Mental Status  Neurologic State: Alert  Orientation Level: Oriented to person, Disoriented to place, Disoriented to situation, Disoriented to time  Cognition: Follows commands  Perception: Appears intact  Perseveration: No perseveration noted  Safety/Judgement: Decreased awareness of environment, Fall prevention                          Physical Skills Involved:  1. Balance  2. Strength  3. Activity Tolerance Cognitive Skills Affected (resulting in the  inability to perform in a timely and safe manner):  1. Executive Function  2. Immediate Memory  3. Short Term Recall  4. Long Term Memory  5. Sustained Attention  6. Divided Attention  7. Comprehension  8. Expression Psychosocial Skills Affected:  1. Habits/Routines   Number of elements that affect the Plan of Care: 5+:  HIGH COMPLEXITY   CLINICAL DECISION MAKING:   [facility] AM-PAC??? ???6 Clicks???   Daily Activity Inpatient Short Form  How much help from another person does the patient currently need... Total A Lot A Little None   1.  Putting on and taking off regular lower body clothing?   [x]  1   []  2   []  3   []  4   2.  Bathing (including washing, rinsing, drying)?   []  1   [x]  2   []  3   []  4   3.  Toileting, which includes using toilet, bedpan or urinal?   []  1   [x]  2   []  3   []  4   4.  Putting on and taking off regular upper body clothing?   []  1   []  2   [x]  3   []  4   5.  Taking care of personal grooming such as brushing teeth?   []  1   []  2   [x]  3   []  4   6.  Eating meals?   []  1   []  2   [x]  3   []  4   ?? 2007, [employer], under license to [place]. All rights reserved      Score:  Initial: 14 Most Recent: X (Date: -- )    Interpretation of Tool:  Represents activities that are increasingly more difficult (i.e. Bed mobility, Transfers, Gait).    Medical Necessity:     ?? Patient is expected to demonstrate progress in strength, balance and functional technique to decrease assistance required with ADLs.  Reason for Services/Other Comments:  ?? Patient continues to require present interventions due to patient's inability to complete ADLs.   Use of outcome tool(s) and clinical judgement create a POC that gives a: MODERATE COMPLEXITY         TREATMENT:   (In addition to Assessment/Re-Assessment sessions the following treatments were rendered)     Pre-treatment Symptoms/Complaints:    Pain: Initial:   Pain Intensity 1: 0  Post Session:  0      Self Care: (13 minutes): Procedure(s) (per grid) utilized to improve and/or restore self-care/home management as related to toileting and hand hygiene. Required moderate assistance cueing to facilitate activities of daily living skills and compensatory activities.  Therapeutic Activity: (10 minutes):  Therapeutic activities including Bed transfers, Toilet transfers  and Ambulation on level ground to improve mobility, strength and balance.  Required min/mod a  to promote static and dynamic balance in standing.         Braces/Orthotics/Lines/Etc:   ?? O2 Device: Nasal cannula  Treatment/Session Assessment:    ?? Response to Treatment:  No adverse reaction   ?? Interdisciplinary Collaboration:   o Certified Occupational Therapy Assistant  o Registered Nurse  ?? After treatment position/precautions:   o Supine in bed  o Bed alarm/tab alert on  o Bed/Chair-wheels locked  o Call light within reach  o RN notified  o side rails up x's 4   ?? Compliance with Program/Exercises: Will assess as treatment progresses.  ?? Recommendations/Intent for next treatment session:  "Next visit will focus on advancements to more challenging activities and reduction in assistance provided".  Total Treatment Duration:  OT Patient Time In/Time Out  Time In: 1315  Time Out: 1338     Rutherford Nail, Calton Golds

## 2019-05-14 NOTE — Progress Notes (Signed)
Hospitalist Progress Note    05/14/2019  Admit Date: 05/09/2019  5:26 PM   NAME: Cathy Jackson   DOB:  21-Jun-1946   MRN:  [id number]   Attending: Malachi Pro, MD  PCP:  Wilfred Lacy, NP    SUBJECTIVE:   73 y/o female with severe frontotemporal dementia, debility, bladder cancer, s/p ileal conduit, s/p nephro-ureteral tube placement 03/29/19, hypothyroid, GERD who suffered a fall at a rehab facility. She was recently admitted 8/8-8/12 for ESBL Klebsiella UTI. Patient tried to stand but she could not. There was concern that she could have broken her hip. Xray does not show definite displaced fracture, but MRI reveals subcapital fracture of the left femoral neck. UA pos for UTI. Started on [place] given recent ESBL infection.     Patient is s/p hip fracture repair, POD 4. She was laying comfortably on bed. Palliative had seen the patient yesterady and family opted for DNR. She remnained with poor intake, but nurse reported she ate little yesterady. Her urine studies are consistent with Klebsiella colonization of an ileal conduit.  No signs of sepsis.  ??ID has stopped the antibiotics and recommend observation . SNF has been arranged for her. Possible discharge on Monday.     Review of Systems negative with exception of pertinent positives noted above  PHYSICAL EXAM     Visit Vitals  BP 127/82 (BP 1 Location: Right arm, BP Patient Position: Supine)   Pulse 85   Temp 98.1 ??F (36.7 ??C)   Resp 18   Ht 5\' 2"  (1.575 m)   Wt 49.9 kg (110 lb)   LMP  (LMP Unknown)   SpO2 96%   BMI 20.12 kg/m??      Temp (24hrs), Avg:98 ??F (36.7 ??C), Min:97.4 ??F (36.3 ??C), Max:98.5 ??F (36.9 ??C)    Oxygen Therapy  O2 Sat (%): 96 % (05/14/19 0541)  Pulse via Oximetry: 99 beats per minute (05/10/19 1520)  O2 Device: Room air (05/11/19 1332)  O2 Flow Rate (L/min): 3 l/min (05/10/19 1510)    Intake/Output Summary (Last 24 hours) at 05/14/2019 0759  Last data filed at 05/14/2019 0026  Gross per 24 hour   Intake ???   Output 600 ml   Net -600 ml       General: No acute distress????aphasic, very weak  Lungs:  CTA Bilaterally.   Heart:  Regular rate and rhythm,?? No murmur, rub, or gallop  Abdomen: Soft, Non distended, Non tender, Positive bowel sounds. Ileostomy bag in place  Extremities: No cyanosis, clubbing or edema, s/p left hip fracture repair, dressing in place  Neurologic:?? No focal deficits    ASSESSMENT      Active Hospital Problems    Diagnosis Date Noted   ??? Closed left hip fracture ([place]) 05/09/2019   ??? Low grade fever 05/09/2019   ??? Dehydration 05/09/2019   ??? Acquired hypothyroidism 08/29/2015   ??? Essential hypertension 08/29/2015   ??? Depression with anxiety 02/01/2015     Last Assessment & Plan:   History of depression, anxiety, and agitation. Currently taking Celexa 20 mg- for about 3-4 years. Tolerating well and denies ADEs. Plan: Continue Celexa 20 mg daily.      ??? Frontotemporal dementia ([place]) 02/01/2015     Last Assessment & Plan:   Disinhibited, agitated, aggressive, violent at times. Will add depakote to try to calm her behaviors down. Might need to adjust namenda, aricept, and paxil, too but will start with depakote. Provided information about the drug, anticipated effects,  and possible side effects. Family will call me next week and let me know how she is doing, we may need to increase the dose, but since she is small, we will need to monitor for fall risk.     Daughter inquired about PACE program. She isnt medicaid eligible, I dont think, but out of pocket might be an option for daytime care. She is also going to look into senior day programs. I will refer her daughter to the [employer] for additional resources. Might need to send daughter REACH at next appt, too.      ??? Asthma 10/27/2012   ??? GERD without esophagitis 10/27/2012   ??? Bladder cancer ([place]) 09/20/2012     A/p    Generalized weakness/debilitated general status/ Dehydration:  Pt is with poor apetite   Asked the nurse for eating assistance  IVF @ maintenance rate   Palliative on board and family opted for DNR    Left hip fracture s/p surgical repair, POD 3  Px mgmt and dvt prophy as per ortho  Physical therapy     UTI - with hx of ESBL:    Her urine studies are consistent with Klebsiella colonization of an ileal conduit.  No signs of sepsis.  ID has stopped antibiotics     Severe dementia - aware:  Behavior stable. Cont home meds (trazadone/ paxil)  Code : DNR   DVT Prophylaxis: asa 325mg  daily/ SCD's as per ortho    Signed By: Malachi Pro, MD     May 14, 2019

## 2019-05-14 NOTE — Progress Notes (Signed)
Problem: Mobility Impaired (Adult and Pediatric)  Goal: *Acute Goals and Plan of Care (Insert Text)  Outcome: Progressing Towards Goal  Note:   LTG:  (1.)[name] will move from supine to sit and sit to supine , scoot up and down, and roll side to side with stand by assist within 7 treatment day(s).  GOAL MET 05/14/2019    (2.)[name] will transfer from bed to chair and chair to bed with minimal assist using the least restrictive device within 7 treatment day(s).  GOAL MET 05/14/2019    (3.)[name] will ambulate with minimal assist for 20 feet with the least restrictive device within 7 treatment day(s). GOAL MET 05/14/2019    (4.)[name] will perform seated lower extremity exercises for 15+ minutes to improve strength and mobility within 7 days.  ________________________________________________________________________________________________     PHYSICAL THERAPY: Daily Note and PM 05/14/2019  INPATIENT: PT Visit Days : 5  Payor: [employer] MEDICARE / Plan: [facility] MEDICARE ADVANTAGE / Product Type: Managed Care Medicare /       NAME/AGE/GENDER: Cathy Jackson is a 73 y.o. female   PRIMARY DIAGNOSIS: Closed left hip fracture (HCC) [S72.002A]  Low grade fever [R50.9] Closed left hip fracture (HCC) Closed left hip fracture (HCC)  Procedure(s) (LRB):  LEFT FEMORAL NECK SYSTEM (Left)  4 Days Post-Op  ICD-10: Treatment Diagnosis:    ?? Generalized Muscle Weakness (M62.81)  ?? Difficulty in walking, Not elsewhere classified (R26.2)  ?? Other abnormalities of gait and mobility (R26.89)  ?? History of falling (Z91.81)   Precaution/Allergies:  Patient has no known allergies.      ASSESSMENT:     [name] is a 73 year old female with history of advanced frontotemporal dementia who is admitted from rehab facility with left hip fracture; seen s/p above surgery and is WBAT per ortho orders.   Patient woke about 11 am and was looking to get rid of the lap belt.  She  was very sweet and thankful I was letting her out of her strap.  She got herself to the EOB without help and was able to scoot herself to the edge.  She has no idea why she is in the hospital and has no pain she can verbalize.  She walked into the bathroom and worked on cleaning herself fairly well.  She washed her hands at the sink then walked to the door and over to chair with CGA/Min A.  She forgets the walker and needs constant assistance with this.  Left up in the chair although she only last less than 30 minutes.  Fair mobility but poor cognition.    PM: patient crawling out of bed 2 more times I saw her.  She got up, used the bathroom and walked about 60 feet into the hallway with CGA.  She continues to have no complaints of pain but walks with antalgic gait and tires fairly quickly.  Above goals met.    This section established at most recent assessment   PROBLEM LIST (Impairments causing functional limitations):  1. Decreased Strength  2. Decreased Transfer Abilities  3. Decreased Ambulation Ability/Technique  4. Decreased Balance  5. Increased Pain  6. Decreased Activity Tolerance  7. Decreased Flexibility/Joint Mobility  8. Decreased Skin Integrity/Hygeine  9. Decreased Cognition   INTERVENTIONS PLANNED: (Benefits and precautions of physical therapy have been discussed with the patient.)  1. Balance Exercise  2. Bed Mobility  3. Gait Training  4. Home Exercise Program (HEP)  5.  Therapeutic Activites  6. Therapeutic Exercise/Strengthening  7. Transfer Training     TREATMENT PLAN: Frequency/Duration: twice daily for duration of hospital stay  Rehabilitation Potential For Stated Goals: Good     REHAB RECOMMENDATIONS (at time of discharge pending progress):    Placement:  It is my opinion, based on this patient's performance to date, that [name] may benefit from intensive therapy at a [facility] after discharge due to the functional deficits listed above that are  likely to improve with skilled rehabilitation and concerns that he/she may be unsafe to be unsupervised at home due to weakness, fall risk, unsteady gait, need for heavy assistance with mobility/transfers .  Equipment:   ? Tbd at rehab              HISTORY:   History of Present Injury/Illness (Reason for Referral):  PER H&P, "Patient is a 73 y/o female with severe frontotemporal dementia, debility, bladder cancer, s/p ileal conduit, s/p nephro-ureteral tube placement 03/29/19, hypothyroid, GERD who suffered a fall today at a rehab facility. She was recently admitted 8/8-8/12 for ESBL Klebsiella UTI. Patient tried to stand but she could not. There was concern that she could have broken her hip. Xray does not show definite displaced fracture, but MRI reveals subcapital fracture of the left femoral neck. Bilateral edema like signal in the pubis bones may be a stress reaction or osteitis pubis. She also has a low grade temp of 100.2 without elevated WBC ct. Chest xray is clear. Her urine in bag is dark, concentrated and cloudy with sediment. UA with culture is pending. She will be admitted for further workup and treatment."    Past Medical History/Comorbidities:   [name]  has a past medical history of Abdominal pain, unspecified site (10/27/2012), Arthritis, Asthma (10/27/2012), Bladder cancer ([place]), Depression, Dysuria (10/27/2012), Environmental allergies, GERD (gastroesophageal reflux disease) (10/27/2012), History of kidney stones, Hypothyroidism, Other "heavy-for-dates" infants, Unspecified disorder of bladder (10/27/2012), Urethral caruncle (10/27/2012), Urinary frequency (10/27/2012), and Urinary tract infection, site not specified (10/27/2012). She also has no past medical history of Difficult intubation, Malignant hyperthermia due to anesthesia, Nausea & vomiting, Pseudocholinesterase deficiency, or Unspecified adverse effect of anesthesia.  [name]   has a past surgical history that includes hx breast biopsy (Left, 2000); hx cesarean section; hx tubal ligation; pr close cystostomy; hx wrist fracture tx (Left); hx cholecystectomy; hx nephrostomy (2014); ir exchange nephro perc rt si (10/23/2017); ir convert nephro perc rt to nephroureteral cath existing si (11/12/2017); ir change internal ureteral stent si (02/15/2018); ir change internal ureteral stent si (04/15/2018); ir change internal ureteral stent si (06/14/2018); ir change uret tube via ileal conduit (09/07/2018); ir change uret tube via ileal conduit (12/20/2018); ir change uret tube via ileal conduit (03/21/2019); and ir nephroureteral perc rt plc cath new access  si (03/29/2019).  Social History/Living Environment:      Prior Level of Function/Work/Activity:  From rehab. Advanced dementia. Unclear prior level of function at rehab facility.     Number of Personal Factors/Comorbidities that affect the Plan of Care: 3+: HIGH COMPLEXITY   EXAMINATION:   Most Recent Physical Functioning:   Gross Assessment:                  Posture:     Balance:    Bed Mobility:  Supine to Sit: Modified independent  Sit to Supine: Modified independent  Scooting: Stand-by assistance  Wheelchair Mobility:     Transfers:  Sit to Stand: Contact guard  assistance  Stand to Sit: Contact guard assistance;Minimum assistance  Gait:     Speed/Cadence: Shuffled;Slow  Step Length: Left shortened;Right shortened  Gait Abnormalities: Antalgic;Decreased step clearance;Step to gait;Trunk sway increased  Distance (ft): 60 Feet (ft)  Assistive Device: Walker, rolling  Ambulation - Level of Assistance: Minimal assistance      Body Structures Involved:  1. Eyes and Ears  2. Bones  3. Joints  4. Muscles Body Functions Affected:  1. Mental  2. Sensory/Pain  3. Neuromusculoskeletal  4. Movement Related Activities and Participation Affected:  1. General Tasks and Demands  2. Mobility  3. Domestic Life  4. Community, Social and [employer]    Number of elements that affect the Plan of Care: 4+: HIGH COMPLEXITY   CLINICAL PRESENTATION:   Presentation: Evolving clinical presentation with changing clinical characteristics: MODERATE COMPLEXITY   CLINICAL DECISION MAKING:   [facility]??? ???6 Clicks???   Basic Mobility Inpatient Short Form  How much difficulty does the patient currently have... Unable A Lot A Little None   1.  Turning over in bed (including adjusting bedclothes, sheets and blankets)?   '[]'  1   '[]'  2   '[x]'  3   '[]'  4   2.  Sitting down on and standing up from a chair with arms ( e.g., wheelchair, bedside commode, etc.)   '[]'  1   '[x]'  2   '[]'  3   '[]'  4   3.  Moving from lying on back to sitting on the side of the bed?   '[]'  1   '[]'  2   '[x]'  3   '[]'  4   How much help from another person does the patient currently need... Total A Lot A Little None   4.  Moving to and from a bed to a chair (including a wheelchair)?   '[]'  1   '[x]'  2   '[]'  3   '[]'  4   5.  Need to walk in hospital room?   '[]'  1   '[x]'  2   '[]'  3   '[]'  4   6.  Climbing 3-5 steps with a railing?   '[x]'  1   '[]'  2   '[]'  3   '[]'  4   ?? 2007, [employer], under license to [employer]. All rights reserved      Score:  Initial: 13 Most Recent: X (Date: -- )    Interpretation of Tool:  Represents activities that are increasingly more difficult (i.e. Bed mobility, Transfers, Gait).    Medical Necessity:     ?? Patient demonstrates   ?? fair  ??  rehab potential due to higher previous functional level.  Reason for Services/Other Comments:  ?? Patient   ?? continues to demonstrate capacity to improve strength, mobility, balance, transfers, and activity tolerance which will   ?? increase independence, decrease amount of assistance required from caregiver, and increase safety  ?? .   Use of outcome tool(s) and clinical judgement create a POC that gives a: Questionable prediction of patient's progress: MODERATE COMPLEXITY            TREATMENT:    (In addition to Assessment/Re-Assessment sessions the following treatments were rendered)   Pre-treatment Symptoms/Complaints:  pt alert and confused  Pain: Initial:   Pain Intensity 1: 0  Post Session:  0/10     Therapeutic Activity: (    45 minutes):  Therapeutic activities including Bed transfers, Chair transfers, standing balance, toilet transfers and Ambulation on  level ground to improve mobility, strength, balance, coordination, and activity tolerance .  Required minimal  assist and  to promote static and dynamic balance in standing and promote motor control of bilateral, lower extremity(s).        Date:  05/12/19 Date:   Date:     ACTIVITY/EXERCISE AM PM AM PM AM PM   Ambulation:           Distance  Device  Duration         Seated Heel Raises X 15 B AA        Seated Toe Raises X 15 B AA        Seated Long Arc Quads X 15 B AA        Seated Marching X 15 B AA        Seated Hip Abduction X 15 B AA                 B = bilateral; AA = active assistive; A = active; P = passive    Braces/Orthotics/Lines/Etc:   ?? urostomy  Treatment/Session Assessment:    ?? Response to Treatment:  Improved transfers/ambulation this afternoon.  ?? Interdisciplinary Collaboration:   o Physical Therapy Assistant  o Registered Nurse  ?? After treatment position/precautions:   o Supine in bed  o Bed alarm/tab alert on  o Bed/Chair-wheels locked  o Bed in low position  o Call light within reach  o RN notified  o bed in trandelenburg   ?? Compliance with Program/Exercises: Compliant all of the time  ?? Recommendations/Intent for next treatment session:  "Next visit will focus on advancements to more challenging activities and reduction in assistance provided".  Total Treatment Duration:  PT Patient Time In/Time Out  Time In: 1445  Time Out: 1530    Jeanne Ivan, PTA

## 2019-05-14 NOTE — Progress Notes (Signed)
Problem: Falls - Risk of  Goal: *Absence of Falls  Description: Document Cathy Jackson Fall Risk and appropriate interventions in the flowsheet.  Outcome: Progressing Towards Goal  Note: Fall Risk Interventions:  Mobility Interventions: Bed/chair exit alarm, Communicate number of staff needed for ambulation/transfer, OT consult for ADLs, Patient to call before getting OOB, PT Consult for mobility concerns    Mentation Interventions: Adequate sleep, hydration, pain control, Bed/chair exit alarm, Door open when patient unattended, Increase mobility, More frequent rounding, Reorient patient    Medication Interventions: Bed/chair exit alarm, Patient to call before getting OOB, Teach patient to arise slowly    Elimination Interventions: Bed/chair exit alarm, Call light in reach, Patient to call for help with toileting needs, Stay With Me (per policy), Toilet paper/wipes in reach, Toileting schedule/hourly rounds    History of Falls Interventions: Bed/chair exit alarm, Door open when patient unattended, Investigate reason for fall         Problem: Patient Education: Go to Patient Education Activity  Goal: Patient/Family Education  Outcome: Progressing Towards Goal     Problem: Pressure Injury - Risk of  Goal: *Prevention of pressure injury  Description: Document Braden Scale and appropriate interventions in the flowsheet.  Outcome: Progressing Towards Goal  Note: Pressure Injury Interventions:  Sensory Interventions: Assess changes in LOC, Check visual cues for pain, Discuss PT/OT consult with provider, Float heels, Keep linens dry and wrinkle-free, Maintain/enhance activity level, Minimize linen layers, Pressure redistribution bed/mattress (bed type)    Moisture Interventions: Absorbent underpads, Limit adult briefs, Minimize layers    Activity Interventions: Increase time out of bed, Pressure redistribution bed/mattress(bed type), PT/OT evaluation    Mobility Interventions: HOB 30 degrees or less, Pressure redistribution  bed/mattress (bed type), PT/OT evaluation    Nutrition Interventions: Document food/fluid/supplement intake    Friction and Shear Interventions: Apply protective barrier, creams and emollients, Foam dressings/transparent film/skin sealants, HOB 30 degrees or less, Lift sheet, Minimize layers                Problem: Patient Education: Go to Patient Education Activity  Goal: Patient/Family Education  Outcome: Progressing Towards Goal     Problem: Patient Education: Go to Patient Education Activity  Goal: Patient/Family Education  Outcome: Progressing Towards Goal     Problem: Patient Education: Go to Patient Education Activity  Goal: Patient/Family Education  Outcome: Progressing Towards Goal     Problem: Patient Education: Go to Patient Education Activity  Goal: Patient/Family Education  Outcome: Progressing Towards Goal     Problem: Hip Fracture: Post-Op Day 4  Goal: Off Pathway (Use only if patient is Off Pathway)  Outcome: Progressing Towards Goal  Goal: Activity/Safety  Outcome: Progressing Towards Goal  Goal: Diagnostic Test/Procedures  Outcome: Progressing Towards Goal  Goal: Nutrition/Diet  Outcome: Progressing Towards Goal  Goal: Medications  Outcome: Progressing Towards Goal  Goal: Respiratory  Outcome: Progressing Towards Goal  Goal: Treatments/Interventions/Procedures  Outcome: Progressing Towards Goal  Goal: Psychosocial  Outcome: Progressing Towards Goal  Goal: Discharge Planning  Outcome: Progressing Towards Goal  Goal: *Met physical therapy criteria for discharge to next level of care  Outcome: Progressing Towards Goal  Goal: *Optimal pain control at patient's stated goal  Outcome: Progressing Towards Goal  Goal: *Hemodynamically stable  Outcome: Progressing Towards Goal  Goal: *Tolerating diet  Outcome: Progressing Towards Goal  Goal: *Active bowel function  Outcome: Progressing Towards Goal  Goal: *Adequate urinary output  Outcome: Progressing Towards Goal  Goal: *Absence of skin breakdown   Outcome: Progressing Towards  Goal  Goal: *Patient verbalizes understanding of discharge instructions  Outcome: Progressing Towards Goal

## 2019-05-15 LAB — CBC WITH AUTOMATED DIFF
ABS. BASOPHILS: 0 10*3/uL (ref 0.0–0.2)
ABS. EOSINOPHILS: 0.3 10*3/uL (ref 0.0–0.8)
ABS. IMM. GRANS.: 0 10*3/uL (ref 0.0–0.5)
ABS. LYMPHOCYTES: 1.7 10*3/uL (ref 0.5–4.6)
ABS. MONOCYTES: 0.5 10*3/uL (ref 0.1–1.3)
ABS. NEUTROPHILS: 2.6 10*3/uL (ref 1.7–8.2)
ABSOLUTE NRBC: 0 10*3/uL (ref 0.0–0.2)
BASOPHILS: 0 % (ref 0.0–2.0)
EOSINOPHILS: 6 % (ref 0.5–7.8)
HCT: 31.3 % — ABNORMAL LOW (ref 35.8–46.3)
HGB: 10 g/dL — ABNORMAL LOW (ref 11.7–15.4)
IMMATURE GRANULOCYTES: 0 % (ref 0.0–5.0)
LYMPHOCYTES: 33 % (ref 13–44)
MCH: 31 PG (ref 26.1–32.9)
MCHC: 31.9 g/dL (ref 31.4–35.0)
MCV: 96.9 FL (ref 79.6–97.8)
MONOCYTES: 10 % (ref 4.0–12.0)
MPV: 9.4 FL (ref 9.4–12.3)
NEUTROPHILS: 50 % (ref 43–78)
PLATELET: 223 10*3/uL (ref 150–450)
RBC: 3.23 M/uL — ABNORMAL LOW (ref 4.05–5.2)
RDW: 13.8 % (ref 11.9–14.6)
WBC: 5.3 10*3/uL (ref 4.3–11.1)

## 2019-05-15 LAB — METABOLIC PANEL, BASIC
Anion gap: 6 mmol/L — ABNORMAL LOW (ref 7–16)
BUN: 13 MG/DL (ref 8–23)
CO2: 27 mmol/L (ref 21–32)
Calcium: 8.5 MG/DL (ref 8.3–10.4)
Chloride: 109 mmol/L — ABNORMAL HIGH (ref 98–107)
Creatinine: 0.54 MG/DL — ABNORMAL LOW (ref 0.6–1.0)
GFR est AA: >60 mL/min/{1.73_m2} (ref >60)
GFR est non-AA: >60 mL/min/{1.73_m2} (ref >60)
Glucose: 79 mg/dL (ref 65–100)
Potassium: 3.6 mmol/L (ref 3.5–5.1)
Sodium: 142 mmol/L (ref 136–145)

## 2019-05-15 MED FILL — FERROUS SULFATE 325 MG (65 MG ELEMENTAL IRON) TAB: 325 mg (65 mg iron) | ORAL | Qty: 1

## 2019-05-15 MED FILL — CALCIUM 500 + D 500 MG-5 MCG (200 UNIT) TABLET: 500 mg-5 mcg (200 unit) | ORAL | Qty: 1

## 2019-05-15 MED FILL — MAPAP (ACETAMINOPHEN) 325 MG TABLET: 325 mg | ORAL | Qty: 2

## 2019-05-15 MED FILL — PAROXETINE 20 MG TAB: 20 mg | ORAL | Qty: 1

## 2019-05-15 MED FILL — ASPIRIN 325 MG TAB: 325 mg | ORAL | Qty: 1

## 2019-05-15 MED FILL — ATORVASTATIN 10 MG TAB: 10 mg | ORAL | Qty: 1

## 2019-05-15 MED FILL — TRAZODONE 50 MG TAB: 50 mg | ORAL | Qty: 1

## 2019-05-15 MED FILL — OYSTER SHELL CALCIUM 500  500 MG CALCIUM (1,250 MG) TABLET: 500 mg calcium (1,250 mg) | ORAL | Qty: 1

## 2019-05-15 MED FILL — STOOL SOFTENER-STIMULANT LAXATIVE 8.6 MG-50 MG TABLET: ORAL | Qty: 1

## 2019-05-15 MED FILL — VITAMIN C 500 MG TABLET: 500 mg | ORAL | Qty: 2

## 2019-05-15 NOTE — Progress Notes (Signed)
She is stable. Took two steps today so far. PT will continue.

## 2019-05-15 NOTE — Progress Notes (Signed)
Problem: Mobility Impaired (Adult and Pediatric)  Goal: *Acute Goals and Plan of Care (Insert Text)  Outcome: Progressing Towards Goal  Note:   LTG: Goals Updated 05/15/19  (1.)Cathy Jackson will move from supine to sit and sit to supine , scoot up and down, and roll side to side with supervision within 7 treatment day(s).   (2.)Cathy Jackson will transfer from bed to chair and chair to bed with stand by assist using the least restrictive device within 7 treatment day(s).  (3.)Cathy Jackson will ambulate with stand by assist for 50 feet with the least restrictive device within 7 treatment day(s).  (4.)Cathy Jackson will perform seated lower extremity exercises for 15+ minutes to improve strength and mobility within 7 days.  ________________________________________________________________________________________________     PHYSICAL THERAPY: Daily Note, Re-evaluation and PM 05/15/2019  INPATIENT: PT Visit Days : 1  Payor: AETNA MEDICARE / Plan: [facility] AETNA MEDICARE ADVANTAGE / Product Type: Managed Care Medicare /       NAME/AGE/GENDER: Cathy Jackson is a 73 y.o. female   PRIMARY DIAGNOSIS: Closed left hip fracture (HCC) [S72.002A]  Low grade fever [R50.9] Closed left hip fracture (HCC) Closed left hip fracture (HCC)  Procedure(s) (LRB):  LEFT FEMORAL NECK SYSTEM (Left)  5 Days Post-Op  ICD-10: Treatment Diagnosis:    ?? Generalized Muscle Weakness (M62.81)  ?? Difficulty in walking, Not elsewhere classified (R26.2)  ?? Other abnormalities of gait and mobility (R26.89)  ?? History of falling (Z91.81)   Precaution/Allergies:  Patient has no known allergies.      ASSESSMENT:     Cathy Jackson is a 73 year old female with history of advanced frontotemporal dementia who is admitted from rehab facility with left hip fracture; seen s/p above surgery and is WBAT per ortho orders.  Patient presents very restless and anxious this morning stating she has to get up and go to the bathroom.  PT assisted patient.  She was SBA with  supine to sit with good sitting balance.  Sit to stand was CGA and she transitioned over to the [facility].  She attempted to have a BM with little result.  Patient stood with the RW and CGA and PT assisted with hygiene and could tell the patient is impacted with bowel.  A brief was placed during standing and the patient transitioned back over to sit on the bed with CGA using the RW.  Patient stood a 2nd time and ambulated 86' with the RW with CGA.  Her LLE tends to stay adducted to the mid-line and she takes short steps during gait.  Overall her gait with the RW was steady.  She returned to the room and back to her bed and she participated in LLE AAROM exercises in supine 1 x 10 reps.  She was positioned for comfort with the bed alarm on and her personal items in reach.  Nursing was notified about her continuous need to go to the bathroom and the impacted stool.  She was pleasantly confused and was asking about her family coming today to visit.      PM Note: she is attempted to get out of bed on contact, agreeable to physical therapy treatment and re-evaluation. Reports needing to use the restroom. Max cues required for safety and correct walker use this PM, however only required CGA/minimal assist for transfers, ambulation, supervision for bed mobility. Goals reviewed and updated as patient has met several of her goals on previous therapy sessions. Will continue therapy efforts.  This section established at most recent assessment   PROBLEM LIST (Impairments causing functional limitations):  1. Decreased Strength  2. Decreased Transfer Abilities  3. Decreased Ambulation Ability/Technique  4. Decreased Balance  5. Increased Pain  6. Decreased Activity Tolerance  7. Decreased Flexibility/Joint Mobility  8. Decreased Skin Integrity/Hygeine  9. Decreased Cognition   INTERVENTIONS PLANNED: (Benefits and precautions of physical therapy have been discussed with the patient.)  1. Balance Exercise  2. Bed Mobility   3. Gait Training  4. Home Exercise Program (HEP)  5. Therapeutic Activites  6. Therapeutic Exercise/Strengthening  7. Transfer Training     TREATMENT PLAN: Frequency/Duration: twice daily for duration of hospital stay  Rehabilitation Potential For Stated Goals: Good     REHAB RECOMMENDATIONS (at time of discharge pending progress):    Placement:  It is my opinion, based on this patient's performance to date, that Cathy Jackson may benefit from intensive therapy at a [place] after discharge due to the functional deficits listed above that are likely to improve with skilled rehabilitation and concerns that he/she may be unsafe to be unsupervised at home due to weakness, fall risk, unsteady gait, need for heavy assistance with mobility/transfers .  Equipment:   ? Tbd at rehab              HISTORY:   History of Present Injury/Illness (Reason for Referral):  PER H&P, "Patient is a 73 y/o female with severe frontotemporal dementia, debility, bladder cancer, s/p ileal conduit, s/p nephro-ureteral tube placement 03/29/19, hypothyroid, GERD who suffered a fall today at a rehab facility. She was recently admitted 8/8-8/12 for ESBL Klebsiella UTI. Patient tried to stand but she could not. There was concern that she could have broken her hip. Xray does not show definite displaced fracture, but MRI reveals subcapital fracture of the left femoral neck. Bilateral edema like signal in the pubis bones may be a stress reaction or osteitis pubis. She also has a low grade temp of 100.2 without elevated WBC ct. Chest xray is clear. Her urine in bag is dark, concentrated and cloudy with sediment. UA with culture is pending. She will be admitted for further workup and treatment."    Past Medical History/Comorbidities:   Cathy Jackson  has a past medical history of Abdominal pain, unspecified site (10/27/2012), Arthritis, Asthma (10/27/2012), Bladder cancer ([place]), Depression, Dysuria (10/27/2012), Environmental allergies, GERD  (gastroesophageal reflux disease) (10/27/2012), History of kidney stones, Hypothyroidism, Other "heavy-for-dates" infants, Unspecified disorder of bladder (10/27/2012), Urethral caruncle (10/27/2012), Urinary frequency (10/27/2012), and Urinary tract infection, site not specified (10/27/2012). She also has no past medical history of Difficult intubation, Malignant hyperthermia due to anesthesia, Nausea & vomiting, Pseudocholinesterase deficiency, or Unspecified adverse effect of anesthesia.  Cathy Jackson  has a past surgical history that includes hx breast biopsy (Left, 2000); hx cesarean section; hx tubal ligation; pr close cystostomy; hx wrist fracture tx (Left); hx cholecystectomy; hx nephrostomy (2014); ir exchange nephro perc rt si (10/23/2017); ir convert nephro perc rt to nephroureteral cath existing si (11/12/2017); ir change internal ureteral stent si (02/15/2018); ir change internal ureteral stent si (04/15/2018); ir change internal ureteral stent si (06/14/2018); ir change uret tube via ileal conduit (09/07/2018); ir change uret tube via ileal conduit (12/20/2018); ir change uret tube via ileal conduit (03/21/2019); and ir nephroureteral perc rt plc cath new access  si (03/29/2019).  Social History/Living Environment:      Prior Level of Function/Work/Activity:  From rehab. Advanced dementia. Unclear prior level of  function at rehab facility.     Number of Personal Factors/Comorbidities that affect the Plan of Care: 3+: HIGH COMPLEXITY   EXAMINATION:   Most Recent Physical Functioning:   Gross Assessment:                  Posture:     Balance:  Sitting: Impaired  Sitting - Static: Good (unsupported)  Sitting - Dynamic: Fair (occasional)  Standing: Impaired  Standing - Static: Fair  Standing - Dynamic : Fair Bed Mobility:  Sit to Supine: Stand-by assistance  Scooting: Stand-by assistance  Interventions: Safety awareness training  Wheelchair Mobility:     Transfers:  Sit to Stand: Contact guard assistance   Stand to Sit: Contact guard assistance  Toilet Transfer : Minimum assistance  Interventions: Safety awareness training;Verbal cues;Visual cues  Gait:     Base of Support: Narrowed  Speed/Cadence: Slow  Step Length: Right shortened;Left shortened  Gait Abnormalities: Decreased step clearance;Antalgic  Distance (ft): 25 Feet (ft)  Assistive Device: Walker, rolling  Ambulation - Level of Assistance: Minimal assistance  Interventions: Safety awareness training;Verbal cues;Visual/Demos      Body Structures Involved:  1. Eyes and Ears  2. Bones  3. Joints  4. Muscles Body Functions Affected:  1. Mental  2. Sensory/Pain  3. Neuromusculoskeletal  4. Movement Related Activities and Participation Affected:  1. General Tasks and Demands  2. Mobility  3. Domestic Life  4. Community, Social and [employer]   Number of elements that affect the Plan of Care: 4+: HIGH COMPLEXITY   CLINICAL PRESENTATION:   Presentation: Evolving clinical presentation with changing clinical characteristics: MODERATE COMPLEXITY   CLINICAL DECISION MAKING:   [place]??? ???6 Clicks???   Basic Mobility Inpatient Short Form  How much difficulty does the patient currently have... Unable A Lot A Little None   1.  Turning over in bed (including adjusting bedclothes, sheets and blankets)?   '[]'  1   '[]'  2   '[x]'  3   '[]'  4   2.  Sitting down on and standing up from a chair with arms ( e.g., wheelchair, bedside commode, etc.)   '[]'  1   '[x]'  2   '[]'  3   '[]'  4   3.  Moving from lying on back to sitting on the side of the bed?   '[]'  1   '[]'  2   '[x]'  3   '[]'  4   How much help from another person does the patient currently need... Total A Lot A Little None   4.  Moving to and from a bed to a chair (including a wheelchair)?   '[]'  1   '[x]'  2   '[]'  3   '[]'  4   5.  Need to walk in hospital room?   '[]'  1   '[x]'  2   '[]'  3   '[]'  4   6.  Climbing 3-5 steps with a railing?   '[x]'  1   '[]'  2   '[]'  3   '[]'  4   ?? 2007, Trustees of [employer], under license to [employer]. All  rights reserved      Score:  Initial: 13 Most Recent: X (Date: -- )    Interpretation of Tool:  Represents activities that are increasingly more difficult (i.e. Bed mobility, Transfers, Gait).    Medical Necessity:     ?? Patient demonstrates   ?? fair  ??  rehab potential due to higher previous functional level.  Reason for Services/Other  Comments:  ?? Patient   ?? continues to demonstrate capacity to improve strength, mobility, balance, transfers, and activity tolerance which will   ?? increase independence, decrease amount of assistance required from caregiver, and increase safety  ?? .   Use of outcome tool(s) and clinical judgement create a POC that gives a: Questionable prediction of patient's progress: MODERATE COMPLEXITY            TREATMENT:   (In addition to Assessment/Re-Assessment sessions the following treatments were rendered)   Pre-treatment Symptoms/Complaints: Patient was focused on her bowels and the need to have a BM today.   Pain: Initial: 0/10  Pain Intensity 1: 0  Post Session:  0/10     Therapeutic Activity: (  10  minutes):  Therapeutic activities including Bed transfers, standing balance, toilet transfers and Ambulation on level ground to improve mobility, strength, balance, coordination, and activity tolerance .  Required minimal  assist and  to promote static and dynamic balance in standing and promote motor control of bilateral, lower extremity(s).        Date:  05/12/19 Date:  05/15/19 Date:     ACTIVITY/EXERCISE AM PM AM PM AM PM   Ambulation:           Distance  Device  Duration         Seated Heel Raises X 15 B AA        Seated Toe Raises X 15 B AA        Seated Long Arc Quads X 15 B AA        Seated Marching X 15 B AA        Seated Hip Abduction X 15 B AA        Supine heel slides   X 15 B AA      Supine ankle pumps   X 15 B AA      Supine SAQ   X 10 B AA               B = bilateral; AA = active assistive; A = active; P = passive    Braces/Orthotics/Lines/Etc:   ?? urostomy   Treatment/Session Assessment:    ?? Response to Treatment:  Patient participated and tolerated therapy well despite her confusion  ?? Interdisciplinary Collaboration:   o Physical Therapist  o Registered Nurse  ?? After treatment position/precautions:   o Supine in bed  o Bed alarm/tab alert on  o Bed/Chair-wheels locked  o Bed in low position  o Call light within reach  o RN notified   ?? Compliance with Program/Exercises: Compliant all of the time  ?? Recommendations/Intent for next treatment session:  "Next visit will focus on advancements to more challenging activities and reduction in assistance provided".  Total Treatment Duration:  PT Patient Time In/Time Out  Time In: 1327  Time Out: [place], PT, DPT

## 2019-05-15 NOTE — Progress Notes (Signed)
Problem: Self Care Deficits Care Plan (Adult)  Goal: *Acute Goals and Plan of Care (Insert Text)  Outcome: Progressing Towards Goal  Note: 1. Pt will toilet with mod A   2. Pt will complete functional mobility for ADLs with min A  3. Pt will complete lower body dressing with mod A using AE as needed  4. Pt will complete grooming and hygiene with set up  5. Pt will complete HEP with min cues to promote increased BUE strength and functional use for ADLs  6. Pt will maintain standing balance for ADLs with CGA  7. Pt will complete bed mobility with CGA in prep for ADLs    Timeframe: 7 days       OCCUPATIONAL THERAPY: Daily Note and AM    05/15/2019  INPATIENT: OT Visit Days: 5  Payor: AETNA MEDICARE / Plan: [facility] AETNA MEDICARE ADVANTAGE / Product Type: Managed Care Medicare /      NAME/AGE/GENDER: Cathy Jackson is a 73 y.o. female   PRIMARY DIAGNOSIS:  Closed left hip fracture (HCC) [S72.002A]  Low grade fever [R50.9] Closed left hip fracture (HCC) Closed left hip fracture (HCC)  Procedure(s) (LRB):  LEFT FEMORAL NECK SYSTEM (Left)  5 Days Post-Op  ICD-10: Treatment Diagnosis:    ?? History of falling (Z91.81)   Precautions/Allergies:    WBAT Patient has no known allergies.      ASSESSMENT:     [name] presents with L hip fx d/t falling at rehab, WBAT. Pt presented A&O to person only, minimally verbal, unable to provide detailed PLOF information but did indicate that she required some assistance for bathing, dressing, and toileting. Pt presented with deficits in mobility, balance, endurance, strength, and cognition impacting ADLs.     05/15/2019 Pt was sitting on bed side commode upon arrival. Pt required max A to don brief. Pt completed functional transfer to chair with min A. Pt sat at sink and completed sponge bath and dressing with the assistance listed below. Pt required verbal cues to complete task. Continue POC.     This section established at most recent assessment    PROBLEM LIST (Impairments causing functional limitations):  1. Decreased Strength  2. Decreased ADL/Functional Activities  3. Decreased Transfer Abilities  4. Decreased Balance  5. Decreased Cognition   INTERVENTIONS PLANNED: (Benefits and precautions of occupational therapy have been discussed with the patient.)  1. Activities of daily living training  2. Adaptive equipment training  3. Balance training  4. Therapeutic activity  5. Therapeutic exercise     TREATMENT PLAN: Frequency/Duration: Follow patient 3 times/ week to address above goals.  Rehabilitation Potential For Stated Goals: [place] (at time of discharge pending progress):    Placement:  It is my opinion, based on this patient's performance to date, that [name] may benefit from intensive therapy at a [place] after discharge due to the functional deficits listed above that are likely to improve with skilled rehabilitation and concerns that he/she may be unsafe to be unsupervised at home due to fall risk, inability to mobilize .  Equipment:   ? None at this time              [place]:   History of Present Injury/Illness (Reason for Referral):  See H&P  Past Medical History/Comorbidities:   [name]  has a past medical history of Abdominal pain, unspecified site (10/27/2012), Arthritis, Asthma (10/27/2012), Bladder cancer ([place]), Depression, Dysuria (  10/27/2012), Environmental allergies, GERD (gastroesophageal reflux disease) (10/27/2012), History of kidney stones, Hypothyroidism, Other "heavy-for-dates" infants, Unspecified disorder of bladder (10/27/2012), Urethral caruncle (10/27/2012), Urinary frequency (10/27/2012), and Urinary tract infection, site not specified (10/27/2012). She also has no past medical history of Difficult intubation, Malignant hyperthermia due to anesthesia, Nausea & vomiting, Pseudocholinesterase deficiency, or Unspecified adverse effect of anesthesia.  [name]   has a past surgical history that includes hx breast biopsy (Left, 2000); hx cesarean section; hx tubal ligation; pr close cystostomy; hx wrist fracture tx (Left); hx cholecystectomy; hx nephrostomy (2014); ir exchange nephro perc rt si (10/23/2017); ir convert nephro perc rt to nephroureteral cath existing si (11/12/2017); ir change internal ureteral stent si (02/15/2018); ir change internal ureteral stent si (04/15/2018); ir change internal ureteral stent si (06/14/2018); ir change uret tube via ileal conduit (09/07/2018); ir change uret tube via ileal conduit (12/20/2018); ir change uret tube via ileal conduit (03/21/2019); and ir nephroureteral perc rt plc cath new access  si (03/29/2019).  Social History/Living Environment:    unknown  Prior Level of Function/Work/Activity:  See assessment      Number of Personal Factors/Comorbidities that affect the Plan of Care: Expanded review of therapy/medical records (1-2):  MODERATE COMPLEXITY   ASSESSMENT OF OCCUPATIONAL PERFORMANCE::   Activities of Daily Living:   Basic ADLs (From Assessment) Complex ADLs (From Assessment)   Feeding: Setup  Oral Facial Hygiene/Grooming: Minimum assistance  Bathing: Maximum assistance  Upper Body Dressing: Moderate assistance  Lower Body Dressing: Total assistance  Toileting: Maximum assistance, Total assistance Instrumental ADL  Meal Preparation: Total assistance  Homemaking: Total assistance   Grooming/Bathing/Dressing Activities of Daily Living   Grooming  Washing Face: Set-up;Supervision  Brushing/Combing Hair: Minimum assistance Cognitive Retraining  Safety/Judgement: Fall prevention   Upper Body Bathing  Bathing Assistance: Set-up  Position Performed: Seated in chair     Lower Body Bathing  Bathing Assistance: Minimum assistance  Perineal  : Minimum assistance  Lower Body : Minimum assistance     Upper Body Dressing Assistance  Dressing Assistance: [place]: Set-up Functional Transfers  Toilet Transfer : Minimum assistance    Lower Body Dressing Assistance  Protective [place]arm[place]: Maximum assistance  Socks: Minimum assistance Bed/Mat Mobility  Sit to Stand: Minimum assistance  Stand to Sit: Minimum assistance  Bed to Chair: Minimum assistance     Most Recent Physical Functioning:   Gross Assessment:                  Posture:  Posture (WDL): Exceptions to WDL  Posture Assessment: Forward head, Rounded shoulders, Trunk flexion  Balance:  Sitting: Intact  Standing: Pull to stand;With support Bed Mobility:     Wheelchair Mobility:     Transfers:  Sit to Stand: Minimum assistance  Stand to Sit: Minimum assistance  Bed to Chair: Minimum assistance            Patient Vitals for the past 6 hrs:   BP BP Patient Position SpO2 Pulse   05/15/19 0546 112/77 Sitting 97 % 88   05/15/19 0812 131/74 At rest 97 % 88       Mental Status  Neurologic State: Alert  Orientation Level: Oriented to person, Disoriented to situation, Disoriented to place  Cognition: Decreased command following  Perception: Tactile, Verbal  Perseveration: Tactile cues provided, Verbal cues provided  Safety/Judgement: Fall prevention  Physical Skills Involved:  1. Balance  2. Strength  3. Activity Tolerance Cognitive Skills Affected (resulting in the inability to perform in a timely and safe manner):  1. Executive Function  2. Immediate Memory  3. Short Term Recall  4. Long Term Memory  5. Sustained Attention  6. Divided Attention  7. Comprehension  8. Expression Psychosocial Skills Affected:  1. Habits/Routines   Number of elements that affect the Plan of Care: 5+:  HIGH COMPLEXITY   CLINICAL DECISION MAKING:   [place] AM-PAC??? ???6 Clicks???   Daily Activity Inpatient Short Form  How much help from another person does the patient currently need... Total A Lot A Little None   1.  Putting on and taking off regular lower body clothing?   [x]  1   []  2   []  3   []  4   2.  Bathing (including washing, rinsing, drying)?   []  1   [x]  2   []  3   []  4    3.  Toileting, which includes using toilet, bedpan or urinal?   []  1   [x]  2   []  3   []  4   4.  Putting on and taking off regular upper body clothing?   []  1   []  2   [x]  3   []  4   5.  Taking care of personal grooming such as brushing teeth?   []  1   []  2   [x]  3   []  4   6.  Eating meals?   []  1   []  2   [x]  3   []  4   ?? 2007, [employer], under license to [employer]. All rights reserved      Score:  Initial: 14 Most Recent: X (Date: -- )    Interpretation of Tool:  Represents activities that are increasingly more difficult (i.e. Bed mobility, Transfers, Gait).    Medical Necessity:     ?? Patient is expected to demonstrate progress in strength, balance and functional technique to decrease assistance required with ADLs.  Reason for Services/Other Comments:  ?? Patient continues to require present interventions due to patient's inability to complete ADLs.   Use of outcome tool(s) and clinical judgement create a POC that gives a: MODERATE COMPLEXITY         TREATMENT:   (In addition to Assessment/Re-Assessment sessions the following treatments were rendered)     Pre-treatment Symptoms/Complaints:    Pain: Initial:   Pain Intensity 1: 0  Post Session:  0     Self Care: (23): Procedure(s) (per grid) utilized to improve and/or restore self-care/home management as related to dressing, bathing and grooming. Required min verbal and manual cueing to facilitate activities of daily living skills.        Braces/Orthotics/Lines/Etc:   ?? O2 Device: Nasal cannula  Treatment/Session Assessment:    ?? Response to Treatment:  No adverse reaction   ?? Interdisciplinary Collaboration:   o Certified Occupational Therapy Assistant  o Registered Nurse  ?? After treatment position/precautions:   o Up in chair  o Bed alarm/tab alert on  o Bed/Chair-wheels locked  o Call light within reach  o RN notified  o lap belt on    ?? Compliance with Program/Exercises: Will assess as treatment progresses.   ?? Recommendations/Intent for next treatment session:  "Next visit will focus on advancements to more challenging activities and reduction in assistance provided".  Total Treatment Duration:  OT Patient  Time In/Time Out  Time In: 0916  Time Out: 0939     Joella Prince, [place]

## 2019-05-15 NOTE — Progress Notes (Signed)
Hospitalist Progress Note    05/15/2019  Admit Date: 05/09/2019  5:26 PM   NAME: Cathy Jackson   DOB:  01-03-1946   MRN:  [id number]   Attending: Malachi Pro, MD  PCP:  Wilfred Lacy, NP    SUBJECTIVE:   72 y/o female with severe frontotemporal dementia, debility, bladder cancer, s/p ileal conduit, s/p nephro-ureteral tube placement 03/29/19, hypothyroid, GERD who suffered a fall at a rehab facility. She was recently admitted 8/8-8/12 for ESBL Klebsiella UTI. Patient tried to stand but she could not. There was concern that she could have broken her hip. Xray does not show definite displaced fracture, but MRI reveals subcapital fracture of the left femoral neck. UA pos for UTI. Started on [place] given recent ESBL infection. Her urine studies are consistent with Klebsiella colonization of an ileal conduit.  No signs of sepsis. ID has stopped the antibiotics and recommend observation . SNF has been arranged for her. Possible discharge on Monday.     Patient is s/p hip fracture repair, POD 5. She was laying comfortably in bed.  Today she is more alert and responsive.  Nurses reported patient has pulled out her IV at nigh.. Her urine studies are consistent with Klebsiella colonization of an ileal conduit.  No signs of sepsis. ID has stopped the antibiotics and recommend observation . SNF has been arranged for her. Possible discharge on Monday.     Review of Systems negative with exception of pertinent positives noted above  PHYSICAL EXAM     Visit Vitals  BP 112/77 (BP 1 Location: Left arm, BP Patient Position: Sitting)   Pulse 88   Temp 97.7 ??F (36.5 ??C)   Resp 16   Ht 5\' 2"  (1.575 m)   Wt 49.9 kg (110 lb)   LMP  (LMP Unknown)   SpO2 97%   BMI 20.12 kg/m??      Temp (24hrs), Avg:98 ??F (36.7 ??C), Min:97.6 ??F (36.4 ??C), Max:98.7 ??F (37.1 ??C)    Oxygen Therapy  O2 Sat (%): 97 % (05/15/19 0546)  Pulse via Oximetry: 99 beats per minute (05/10/19 1520)  O2 Device: Room air (05/11/19 1332)   O2 Flow Rate (L/min): 3 l/min (05/10/19 1510)    Intake/Output Summary (Last 24 hours) at 05/15/2019 0756  Last data filed at 05/14/2019 2045  Gross per 24 hour   Intake ???   Output 350 ml   Net -350 ml      General: No acute distress????aphasic, very weak  Lungs:  CTA Bilaterally.   Heart:  Regular rate and rhythm,?? No murmur, rub, or gallop  Abdomen: Soft, Non distended, Non tender, Positive bowel sounds. Ileostomy bag in place  Extremities: No cyanosis, clubbing or edema, s/p left hip fracture repair, dressing in place  Neurologic:?? No focal deficits    ASSESSMENT      Active Hospital Problems    Diagnosis Date Noted   ??? Closed left hip fracture ([place]) 05/09/2019   ??? Low grade fever 05/09/2019   ??? Dehydration 05/09/2019   ??? Acquired hypothyroidism 08/29/2015   ??? Essential hypertension 08/29/2015   ??? Depression with anxiety 02/01/2015     Last Assessment & Plan:   History of depression, anxiety, and agitation. Currently taking Celexa 20 mg- for about 3-4 years. Tolerating well and denies ADEs. Plan: Continue Celexa 20 mg daily.      ??? Frontotemporal dementia ([place]) 02/01/2015     Last Assessment & Plan:   Disinhibited, agitated, aggressive, violent at times.  Will add depakote to try to calm her behaviors down. Might need to adjust namenda, aricept, and paxil, too but will start with depakote. Provided information about the drug, anticipated effects, and possible side effects. Family will call me next week and let me know how she is doing, we may need to increase the dose, but since she is small, we will need to monitor for fall risk.     Daughter inquired about PACE program. She isnt medicaid eligible, I dont think, but out of pocket might be an option for daytime care. She is also going to look into senior day programs. I will refer her daughter to the alz association for additional resources. Might need to send daughter REACH at next appt, too.      ??? Asthma 10/27/2012   ??? GERD without esophagitis 10/27/2012    ??? Bladder cancer ([place]) 09/20/2012     A/p    Generalized weakness/debilitated general state  Asked the nurse for eating assistance  IVF @ maintenance rate  Palliative on board and family opted for DNR    Left hip fracture s/p surgical repair, POD 5  Px mgmt and dvt prophy as per ortho  Ortho ok to d/c patient to rehab  Physical therapy     UTI - with hx of ESBL:    Her urine studies are consistent with Klebsiella colonization of an ileal conduit.  No signs of sepsis.  ID has stopped antibiotics     Severe dementia - aware:  Behavior stable. Cont home meds (trazadone/ paxil)  Code : DNR     DVT Prophylaxis: asa 325mg  daily/ SCD's as per ortho    Signed By: Malachi Pro, MD     May 15, 2019

## 2019-05-15 NOTE — Progress Notes (Signed)
Problem: Mobility Impaired (Adult and Pediatric)  Goal: *Acute Goals and Plan of Care (Insert Text)  Outcome: Progressing Towards Goal  Note:   LTG:  (1.)Ms. Siverling will move from supine to sit and sit to supine , scoot up and down, and roll side to side with stand by assist within 7 treatment day(s).  GOAL MET 05/14/2019    (2.)Ms. Cammon will transfer from bed to chair and chair to bed with minimal assist using the least restrictive device within 7 treatment day(s).  GOAL MET 05/14/2019    (3.)Ms. Marmo will ambulate with minimal assist for 20 feet with the least restrictive device within 7 treatment day(s). GOAL MET 05/14/2019    (4.)Ms. Teehan will perform seated lower extremity exercises for 15+ minutes to improve strength and mobility within 7 days.  ________________________________________________________________________________________________     PHYSICAL THERAPY: Daily Note and AM 05/15/2019  INPATIENT: PT Visit Days : 6  Payor: AETNA MEDICARE / Plan: [employer] AETNA MEDICARE ADVANTAGE / Product Type: Managed Care Medicare /       NAME/AGE/GENDER: Gizella [place] is a 73 y.o. female   PRIMARY DIAGNOSIS: Closed left hip fracture (HCC) [S72.002A]  Low grade fever [R50.9] Closed left hip fracture (HCC) Closed left hip fracture (HCC)  Procedure(s) (LRB):  LEFT FEMORAL NECK SYSTEM (Left)  5 Days Post-Op  ICD-10: Treatment Diagnosis:    ?? Generalized Muscle Weakness (M62.81)  ?? Difficulty in walking, Not elsewhere classified (R26.2)  ?? Other abnormalities of gait and mobility (R26.89)  ?? History of falling (Z91.81)   Precaution/Allergies:  Patient has no known allergies.      ASSESSMENT:     Ms. Huq is a 72 year old female with history of advanced frontotemporal dementia who is admitted from rehab facility with left hip fracture; seen s/p above surgery and is WBAT per ortho orders.  Patient presents very restless and anxious this morning stating she has to  get up and go to the bathroom.  PT assisted patient.  She was SBA with supine to sit with good sitting balance.  Sit to stand was CGA and she transitioned over to the [facility].  She attempted to have a BM with little result.  Patient stood with the RW and CGA and PT assisted with hygiene and could tell the patient is impacted with bowel.  A brief was placed during standing and the patient transitioned back over to sit on the bed with CGA using the RW.  Patient stood a 2nd time and ambulated 54' with the RW with CGA.  Her LLE tends to stay adducted to the mid-line and she takes short steps during gait.  Overall her gait with the RW was steady.  She returned to the room and back to her bed and she participated in LLE AAROM exercises in supine 1 x 10 reps.  She was positioned for comfort with the bed alarm on and her personal items in reach.  Nursing was notified about her continuous need to go to the bathroom and the impacted stool.  She was pleasantly confused and was asking about her family coming today to visit.      This section established at most recent assessment   PROBLEM LIST (Impairments causing functional limitations):  1. Decreased Strength  2. Decreased Transfer Abilities  3. Decreased Ambulation Ability/Technique  4. Decreased Balance  5. Increased Pain  6. Decreased Activity Tolerance  7. Decreased Flexibility/Joint Mobility  8. Decreased Skin Integrity/Hygeine  9. Decreased Cognition   INTERVENTIONS  PLANNED: (Benefits and precautions of physical therapy have been discussed with the patient.)  1. Balance Exercise  2. Bed Mobility  3. Gait Training  4. Home Exercise Program (HEP)  5. Therapeutic Activites  6. Therapeutic Exercise/Strengthening  7. Transfer Training     TREATMENT PLAN: Frequency/Duration: twice daily for duration of hospital stay  Rehabilitation Potential For Stated Goals: Good     REHAB RECOMMENDATIONS (at time of discharge pending progress):    Placement:   It is my opinion, based on this patient's performance to date, that Ms. Magel may benefit from intensive therapy at a [facility] after discharge due to the functional deficits listed above that are likely to improve with skilled rehabilitation and concerns that he/she may be unsafe to be unsupervised at home due to weakness, fall risk, unsteady gait, need for heavy assistance with mobility/transfers .  Equipment:   ? Tbd at rehab              HISTORY:   History of Present Injury/Illness (Reason for Referral):  PER H&P, "Patient is a 73 y/o female with severe frontotemporal dementia, debility, bladder cancer, s/p ileal conduit, s/p nephro-ureteral tube placement 03/29/19, hypothyroid, GERD who suffered a fall today at a rehab facility. She was recently admitted 8/8-8/12 for ESBL Klebsiella UTI. Patient tried to stand but she could not. There was concern that she could have broken her hip. Xray does not show definite displaced fracture, but MRI reveals subcapital fracture of the left femoral neck. Bilateral edema like signal in the pubis bones may be a stress reaction or osteitis pubis. She also has a low grade temp of 100.2 without elevated WBC ct. Chest xray is clear. Her urine in bag is dark, concentrated and cloudy with sediment. UA with culture is pending. She will be admitted for further workup and treatment."    Past Medical History/Comorbidities:   Ms. Rabon  has a past medical history of Abdominal pain, unspecified site (10/27/2012), Arthritis, Asthma (10/27/2012), Bladder cancer ([place]), Depression, Dysuria (10/27/2012), Environmental allergies, GERD (gastroesophageal reflux disease) (10/27/2012), History of kidney stones, Hypothyroidism, Other "heavy-for-dates" infants, Unspecified disorder of bladder (10/27/2012), Urethral caruncle (10/27/2012), Urinary frequency (10/27/2012), and Urinary tract infection, site not specified (10/27/2012).  She also has no past medical history of Difficult intubation, Malignant hyperthermia due to anesthesia, Nausea & vomiting, Pseudocholinesterase deficiency, or Unspecified adverse effect of anesthesia.  Ms. Rosamilia  has a past surgical history that includes hx breast biopsy (Left, 2000); hx cesarean section; hx tubal ligation; pr close cystostomy; hx wrist fracture tx (Left); hx cholecystectomy; hx nephrostomy (2014); ir exchange nephro perc rt si (10/23/2017); ir convert nephro perc rt to nephroureteral cath existing si (11/12/2017); ir change internal ureteral stent si (02/15/2018); ir change internal ureteral stent si (04/15/2018); ir change internal ureteral stent si (06/14/2018); ir change uret tube via ileal conduit (09/07/2018); ir change uret tube via ileal conduit (12/20/2018); ir change uret tube via ileal conduit (03/21/2019); and ir nephroureteral perc rt plc cath new access  si (03/29/2019).  Social History/Living Environment:      Prior Level of Function/Work/Activity:  From rehab. Advanced dementia. Unclear prior level of function at rehab facility.     Number of Personal Factors/Comorbidities that affect the Plan of Care: 3+: HIGH COMPLEXITY   EXAMINATION:   Most Recent Physical Functioning:   Gross Assessment:                  Posture:     Balance:  Sitting:  Intact  Sitting - Static: Good (unsupported)  Sitting - Dynamic: Fair (occasional)(restless and anxious at times)  Standing: Impaired  Standing - Static: Fair;Good;Constant support  Standing - Dynamic : Fair;Constant support Bed Mobility:  Rolling: Modified independent  Supine to Sit: Modified independent  Sit to Supine: Modified independent  Scooting: Modified independent  Interventions: Safety awareness training  Wheelchair Mobility:     Transfers:  Sit to Stand: Contact guard assistance  Stand to Sit: Contact guard assistance  Stand Pivot Transfers: Contact guard assistance  Toilet Transfer : Contact guard assistance  Gait:      Base of Support: Narrowed(LLE stays too adducted during gait)  Speed/Cadence: Slow  Step Length: Left shortened;Right shortened  Gait Abnormalities: Decreased step clearance;Antalgic  Distance (ft): 65 Feet (ft)  Assistive Device: Walker, rolling  Ambulation - Level of Assistance: Minimal assistance      Body Structures Involved:  1. Eyes and Ears  2. Bones  3. Joints  4. Muscles Body Functions Affected:  1. Mental  2. Sensory/Pain  3. Neuromusculoskeletal  4. Movement Related Activities and Participation Affected:  1. General Tasks and Demands  2. Mobility  3. Domestic Life  4. Community, Social and [employer]   Number of elements that affect the Plan of Care: 4+: HIGH COMPLEXITY   CLINICAL PRESENTATION:   Presentation: Evolving clinical presentation with changing clinical characteristics: MODERATE COMPLEXITY   CLINICAL DECISION MAKING:   [place]??? ???6 Clicks???   Basic Mobility Inpatient Short Form  How much difficulty does the patient currently have... Unable A Lot A Little None   1.  Turning over in bed (including adjusting bedclothes, sheets and blankets)?   _0  1   _1  2   _2  3   _3  4   2.  Sitting down on and standing up from a chair with arms ( e.g., wheelchair, bedside commode, etc.)   _4  1   _5  2   _6  3   _7  4   3.  Moving from lying on back to sitting on the side of the bed?   _8  1   _9  2   _10  3   _11  4   How much help from another person does the patient currently need... Total A Lot A Little None   4.  Moving to and from a bed to a chair (including a wheelchair)?   _12  1   _13  2   _14  3   _15  4   5.  Need to walk in hospital room?   _16  1   _17  2   _18  3   _19  4   6.  Climbing 3-5 steps with a railing?   _20  1   _21  2   _22  3   _23  4   ?? 2007, Trustees of [employer], under license to [employer]. All rights reserved      Score:  Initial: 13 Most Recent: X (Date: -- )    Interpretation of Tool:  Represents activities that are increasingly more difficult (i.e. Bed mobility, Transfers, Gait).     Medical Necessity:     ?? Patient demonstrates   ?? fair  ??  rehab potential due to higher previous functional level.  Reason for Services/Other Comments:  ?? Patient   ?? continues to demonstrate capacity to improve strength, mobility, balance, transfers, and activity tolerance which will   ?? increase independence, decrease amount of assistance required from caregiver, and increase safety  ?? .   Use  of outcome tool(s) and clinical judgement create a POC that gives a: Questionable prediction of patient's progress: MODERATE COMPLEXITY            TREATMENT:   (In addition to Assessment/Re-Assessment sessions the following treatments were rendered)   Pre-treatment Symptoms/Complaints: Patient was focused on her bowels and the need to have a BM today.   Pain: Initial: 0/10     Post Session:  0/10     Therapeutic Activity: (  31  minutes):  Therapeutic activities including Bed transfers, Chair transfers, standing balance, toilet transfers and Ambulation on level ground to improve mobility, strength, balance, coordination, and activity tolerance .  Required minimal  assist and  to promote static and dynamic balance in standing and promote motor control of bilateral, lower extremity(s).        Date:  05/12/19 Date:  05/15/19 Date:     ACTIVITY/EXERCISE AM PM AM PM AM PM   Ambulation:           Distance  Device  Duration         Seated Heel Raises X 15 B AA        Seated Toe Raises X 15 B AA        Seated Long Arc Quads X 15 B AA        Seated Marching X 15 B AA        Seated Hip Abduction X 15 B AA        Supine heel slides   X 15 B AA      Supine ankle pumps   X 15 B AA      Supine SAQ   X 10 B AA               B = bilateral; AA = active assistive; A = active; P = passive    Braces/Orthotics/Lines/Etc:   ?? urostomy  Treatment/Session Assessment:    ?? Response to Treatment:  Patient participated and tolerated therapy well despite her confusion  ?? Interdisciplinary Collaboration:   o Physical Therapist  o Registered Nurse   ?? After treatment position/precautions:   o Supine in bed  o Bed alarm/tab alert on  o Bed/Chair-wheels locked  o Bed in low position  o Call light within reach  o RN notified   ?? Compliance with Program/Exercises: Compliant all of the time  ?? Recommendations/Intent for next treatment session:  "Next visit will focus on advancements to more challenging activities and reduction in assistance provided".  Total Treatment Duration:  PT Patient Time In/Time Out  Time In: 1025  Time Out: [address] [place], [place]

## 2019-05-16 LAB — CBC WITH AUTOMATED DIFF
ABS. BASOPHILS: 0 10*3/uL (ref 0.0–0.2)
ABS. EOSINOPHILS: 0.3 10*3/uL (ref 0.0–0.8)
ABS. IMM. GRANS.: 0 10*3/uL (ref 0.0–0.5)
ABS. LYMPHOCYTES: 1.7 10*3/uL (ref 0.5–4.6)
ABS. MONOCYTES: 0.6 10*3/uL (ref 0.1–1.3)
ABS. NEUTROPHILS: 2.7 10*3/uL (ref 1.7–8.2)
ABSOLUTE NRBC: 0 10*3/uL (ref 0.0–0.2)
BASOPHILS: 1 % (ref 0.0–2.0)
EOSINOPHILS: 5 % (ref 0.5–7.8)
HCT: 30.8 % — ABNORMAL LOW (ref 35.8–46.3)
HGB: 9.4 g/dL — ABNORMAL LOW (ref 11.7–15.4)
IMMATURE GRANULOCYTES: 0 % (ref 0.0–5.0)
LYMPHOCYTES: 32 % (ref 13–44)
MCH: 30 PG (ref 26.1–32.9)
MCHC: 30.5 g/dL — ABNORMAL LOW (ref 31.4–35.0)
MCV: 98.4 FL — ABNORMAL HIGH (ref 79.6–97.8)
MONOCYTES: 11 % (ref 4.0–12.0)
MPV: 9.5 FL (ref 9.4–12.3)
NEUTROPHILS: 50 % (ref 43–78)
PLATELET: 262 10*3/uL (ref 150–450)
RBC: 3.13 M/uL — ABNORMAL LOW (ref 4.05–5.2)
RDW: 14 % (ref 11.9–14.6)
WBC: 5.3 10*3/uL (ref 4.3–11.1)

## 2019-05-16 LAB — METABOLIC PANEL, BASIC
Anion gap: 5 mmol/L — ABNORMAL LOW (ref 7–16)
BUN: 12 MG/DL (ref 8–23)
CO2: 28 mmol/L (ref 21–32)
Calcium: 8.1 MG/DL — ABNORMAL LOW (ref 8.3–10.4)
Chloride: 110 mmol/L — ABNORMAL HIGH (ref 98–107)
Creatinine: 0.55 MG/DL — ABNORMAL LOW (ref 0.6–1.0)
GFR est AA: >60 mL/min/{1.73_m2} (ref >60)
GFR est non-AA: >60 mL/min/{1.73_m2} (ref >60)
Glucose: 81 mg/dL (ref 65–100)
Potassium: 3.1 mmol/L — ABNORMAL LOW (ref 3.5–5.1)
Sodium: 143 mmol/L (ref 136–145)

## 2019-05-16 MED ORDER — CALCIUM-CHOLECALCIFEROL (D3) 500 MG (1,250 MG)-200 UNIT TAB
500 mg-200 unit | ORAL_TABLET | Freq: Three times a day (TID) | ORAL | 0 refills | Status: AC
Start: 2019-05-16 — End: 2019-06-15

## 2019-05-16 MED ORDER — POTASSIUM CHLORIDE SR 20 MEQ TAB, PARTICLES/CRYSTALS
20 mEq | Freq: Two times a day (BID) | ORAL | Status: DC
Start: 2019-05-16 — End: 2019-05-16
  Administered 2019-05-16: 12:00:00 via ORAL

## 2019-05-16 MED ORDER — ASPIRIN 325 MG TAB
325 mg | ORAL_TABLET | Freq: Every day | ORAL | 0 refills | Status: AC
Start: 2019-05-16 — End: 2019-06-15

## 2019-05-16 MED FILL — OYSTER SHELL CALCIUM 500  500 MG CALCIUM (1,250 MG) TABLET: 500 mg calcium (1,250 mg) | ORAL | Qty: 1

## 2019-05-16 MED FILL — POTASSIUM CHLORIDE SR 20 MEQ TAB, PARTICLES/CRYSTALS: 20 mEq | ORAL | Qty: 2

## 2019-05-16 MED FILL — ATORVASTATIN 10 MG TAB: 10 mg | ORAL | Qty: 1

## 2019-05-16 MED FILL — CALCIUM 500 + D 500 MG-5 MCG (200 UNIT) TABLET: 500 mg-5 mcg (200 unit) | ORAL | Qty: 1

## 2019-05-16 MED FILL — MAPAP (ACETAMINOPHEN) 325 MG TABLET: 325 mg | ORAL | Qty: 2

## 2019-05-16 MED FILL — VITAMIN C 500 MG TABLET: 500 mg | ORAL | Qty: 2

## 2019-05-16 MED FILL — PAROXETINE 20 MG TAB: 20 mg | ORAL | Qty: 1

## 2019-05-16 MED FILL — TRAZODONE 50 MG TAB: 50 mg | ORAL | Qty: 1

## 2019-05-16 MED FILL — FERROUS SULFATE 325 MG (65 MG ELEMENTAL IRON) TAB: 325 mg (65 mg iron) | ORAL | Qty: 1

## 2019-05-16 MED FILL — HYDROCODONE-ACETAMINOPHEN 5 MG-325 MG TAB: 5-325 mg | ORAL | Qty: 1

## 2019-05-16 MED FILL — ASPIRIN 325 MG TAB: 325 mg | ORAL | Qty: 1

## 2019-05-16 MED FILL — STOOL SOFTENER-STIMULANT LAXATIVE 8.6 MG-50 MG TABLET: ORAL | Qty: 1

## 2019-05-16 NOTE — Progress Notes (Signed)
May 16, 2019         Post Op day: 6 Days Post-Op Procedure(s) (LRB):  LEFT FEMORAL NECK SYSTEM (Left)      Admit Date: 05/09/2019  Admit Diagnosis: Closed left hip fracture (HCC) [S72.002A]  Low grade fever [R50.9]       Principle Problem: Closed left hip fracture ([place]).           Subjective: Doing well, No complaints, No SOB, No Chest Pain, No Nausea or Vomiting     Objective:   Vital Signs are Stable, No Acute Distress, Alert,  Dressing is Dry,  Neurovascular exam is normal.     Assessment / Plan :  Patient Active Problem List   Diagnosis Code   ??? Bladder cancer ([place]) C67.9   ??? Urethral caruncle N36.2   ??? Unspecified disorder of bladder N32.9   ??? Asthma J45.909   ??? GERD without esophagitis K21.9   ??? Acquired hypothyroidism E03.9   ??? Essential hypertension I10   ??? Primary osteoarthritis involving multiple joints M89.49   ??? Dysthymia F34.1   ??? Mixed hyperlipidemia E78.2   ??? Perennial allergic rhinitis J30.89   ??? Frontotemporal dementia (HCC) G31.09, F02.80   ??? Allergic rhinitis J30.9   ??? Iron deficiency anemia D50.9   ??? Depression with anxiety F41.8   ??? Hypotension I95.9   ??? AKI (acute kidney injury) ([place]) N17.9   ??? Acute metabolic encephalopathy [id number]   ??? Urinary tract infection due to ESBL Klebsiella N39.0, B96.89   ??? Lactic acidosis E87.2   ??? Hypoalbuminemia E88.09   ??? Severe sepsis with acute organ dysfunction (HCC) A41.9, R65.20   ??? Closed left hip fracture (HCC) S72.002A   ??? Low grade fever R50.9   ??? Dehydration E86.0      Patient Vitals for the past 8 hrs:   BP Temp Pulse Resp SpO2   05/16/19 0756 121/74 98.3 ??F (36.8 ??C) 90 20 91 %   05/16/19 0425 139/71 98.2 ??F (36.8 ??C) 90 20 90 %    Temp (24hrs), Avg:98.1 ??F (36.7 ??C), Min:97.8 ??F (36.6 ??C), Max:98.3 ??F (36.8 ??C)    Body mass index is 20.12 kg/m??.    Lab Results   Component Value Date/Time    HGB 9.4 (L) 05/16/2019 05:20 AM          S/P left hip femoral neck system:  Minimize narcotics??  Medical Mgmt per hospitalist   Anticoagulation plan:??ASA 325mg  daily??  Continue PT  Fall Precautions  DC disp:??rehab placement??  ??       Signed By: Kandy Garrison, PA  05/16/2019,  8:23 AM

## 2019-05-16 NOTE — Progress Notes (Signed)
Patient medically ready for discharge.  Rapid covid performed and results negative.  Patient will discharge at 1215 via [facility] ambulance service to [facility] room 337A.  Daughter updated over phone.  Nurse will call report to [phone].    Care Management Interventions  PCP Verified by CM: Yes(Luis Tomi Bamberger NP)  Mode of Transport at Discharge: [place] Time of Discharge: [place] (CM Consult): Discharge Planning  Physical Therapy Consult: Yes  Occupational Therapy Consult: Yes  Current Support Network: [place], Family Lives Nearby([facility])  Confirm Follow Up Transport: Family  The Plan for Transition of Care is Related to the Following Treatment Goals : ongoing therapy needs  The Patient and/or Patient Representative was Provided with a Choice of Provider and Agrees with the Discharge Plan?: Yes  Freedom of Choice List was Provided with Basic Dialogue that Supports the Patient's Individualized Plan of Care/Goals, Treatment Preferences and Shares the Quality Data Associated with the Providers?: Yes  Discharge Location  Discharge Placement: Skilled nursing facility

## 2019-05-16 NOTE — Discharge Summary (Signed)
Discharge Summary   Patient ID:  Cathy Jackson  [id number]  73 y.o.  08/08/1946  Admit date: 05/09/2019  5:26 PM  Discharge date and time: 05/16/2019  Attending: Malachi Pro, MD  PCP:  Wilfred Lacy, NP  Treatment Team: Attending Provider: Malachi Pro, MD; Consulting Provider: Kriste Basque, MD; Consulting Provider: Durel Salts, MD; Utilization Review: Mickel Crow, RN; Care Manager: Arvella Nigh, RN; Charge Nurse: Trina Ao; Occupational Therapy Assistant: Dondra Spry  Principal Diagnosis Closed left hip fracture [facility])   Principal Problem:    Closed left hip fracture ([place]) (05/09/2019)    Active Problems:    Bladder cancer ([place]) (09/20/2012)      Asthma (10/27/2012)      GERD without esophagitis (10/27/2012)      Acquired hypothyroidism (08/29/2015)      Essential hypertension (08/29/2015)      Frontotemporal dementia ([place]) (02/01/2015)      Overview: Last Assessment & Plan:       Disinhibited, agitated, aggressive, violent at times. Will add depakote to       try to calm her behaviors down. Might need to adjust namenda, aricept, and       paxil, too but will start with depakote. Provided information about the       drug, anticipated effects, and possible side effects. Family will call me       next week and let me know how she is doing, we may need to increase the       dose, but since she is small, we will need to monitor for fall risk.             Daughter inquired about PACE program. She isnt medicaid eligible, I dont       think, but out of pocket might be an option for daytime care. She is also       going to look into senior day programs. I will refer her daughter to the       alz association for additional resources. Might need to send daughter       REACH at next appt, too.       Depression with anxiety (02/01/2015)      Overview: Last Assessment & Plan:       History of depression, anxiety, and agitation. Currently taking Celexa 20        mg- for about 3-4 years. Tolerating well and denies ADEs. Plan: Continue       Celexa 20 mg daily.       Low grade fever (05/09/2019)      Dehydration (05/09/2019)       * Admission Diagnoses: Closed left hip fracture (HCC) [S72.002A]  Low grade fever [R50.9]  * Discharge Diagnoses:    Hospital Problems as of 05/16/2019 Date Reviewed: June 07, 2018          Codes Class Noted - Resolved POA    * (Principal) Closed left hip fracture ([place]) ICD-10-CM: [id number]  ICD-9-CM: 820.8  05/09/2019 - Present Yes        Low grade fever ICD-10-CM: R50.9  ICD-9-CM: 780.60  05/09/2019 - Present Yes        Dehydration ICD-10-CM: E86.0  ICD-9-CM: 276.51  05/09/2019 - Present Yes        Acquired hypothyroidism ICD-10-CM: E03.9  ICD-9-CM: 244.9  08/29/2015 - Present Yes        Essential hypertension ICD-10-CM: I10  ICD-9-CM: 401.9  08/29/2015 - Present Yes  Frontotemporal dementia [place]) ICD-10-CM: G31.09, F02.80  ICD-9-CM: 331.19  02/01/2015 - Present Yes    Overview Signed 10/20/2017 10:25 AM by Wilfred Lacy, NP     Last Assessment & Plan:   Disinhibited, agitated, aggressive, violent at times. Will add depakote to try to calm her behaviors down. Might need to adjust namenda, aricept, and paxil, too but will start with depakote. Provided information about the drug, anticipated effects, and possible side effects. Family will call me next week and let me know how she is doing, we may need to increase the dose, but since she is small, we will need to monitor for fall risk.     Daughter inquired about PACE program. She isnt medicaid eligible, I dont think, but out of pocket might be an option for daytime care. She is also going to look into senior day programs. I will refer her daughter to the alz association for additional resources. Might need to send daughter REACH at next appt, too.              Depression with anxiety ICD-10-CM: F41.8  ICD-9-CM: 300.4  02/01/2015 - Present Yes    Overview Signed 10/20/2017 10:25 AM by Wilfred Lacy, NP      Last Assessment & Plan:   History of depression, anxiety, and agitation. Currently taking Celexa 20 mg- for about 3-4 years. Tolerating well and denies ADEs. Plan: Continue Celexa 20 mg daily.              Asthma ICD-10-CM: J45.909  ICD-9-CM: 493.90  10/27/2012 - Present Yes        GERD without esophagitis ICD-10-CM: K21.9  ICD-9-CM: 530.81  10/27/2012 - Present Yes        Bladder cancer [facility]) ICD-10-CM: C67.9  ICD-9-CM: 188.9  09/20/2012 - Present Yes                Hospital Course:  Please refer to the admission H&P for details of presentation. In summary, Cathy Jackson is a 73 y.o. female  with severe frontotemporal dementia, debility, bladder cancer, s/p ileal conduit, s/p nephro-ureteral tube placement 03/29/19, hypothyroid, GERD who suffered a fall at a rehab facility. She was recently admitted 8/8-8/12 for ESBL Klebsiella UTI. Patient tried to stand but she could not. There was concern that she could have broken her hip. Xray does not show definite displaced fracture, but MRI reveals subcapital fracture of the left femoral neck. Ortho was consulted and patient had left hip femoral neck system on 05/10/2019.  Patient received postop care. Physical therapy was on board. Case management was consulted for rehab on discharge.  Also on admission UA was positive for UTI.  Patient was started on Merem given recent ESBL infection. Her urine studies were consistent with Klebsiella colonization of an ileal conduit. ??No signs of sepsis. ID recommended no antibiotic at this point and discontinued antibiotics.   Patient has severe frontotemporal dementia and has a debilitated state. Patient at her baseline and needs assistance with feeding. Palliative care was consulted for goals of care during this hospital stay and family opted for DNR. Patient is considered stable for discharge today with close follow up as detailed below.     Diagnostic Study/Procedure results summary copied from within [facility] EMR:   6 Days Post-Op  Procedure(s):  [place]: Results:       Chemistry Recent Labs     05/16/19  0520 05/15/19  0601 05/14/19  1059   GLU 81 79 84   NA 143 142 143   K 3.1* 3.6 3.2*   CL 110* 109* 110*   CO2 28 27 23    BUN 12 13 15    CREA 0.55* 0.54* 0.55*   CA 8.1* 8.5 8.2*   AGAP 5* 6* 10      CBC w/Diff Recent Labs     05/16/19  0520 05/15/19  0601 05/14/19  1059   WBC 5.3 5.3 4.9   RBC 3.13* 3.23* 3.12*   HGB 9.4* 10.0* 9.6*   HCT 30.8* 31.3* 30.0*   PLT 262 223 212   GRANS 50 50 47   LYMPH 32 33 35   EOS 5 6 7       Cardiac Enzymes No results for input(s): CPK, CKND1, MYO in the last 72 hours.    No lab exists for component: CKRMB, TROIP   Coagulation No results for input(s): PTP, INR, APTT, INREXT in the last 72 hours.    Lipid Panel @BRIEFLAB (CHOL,CHOLPOCT,804501,884253,LCA120659,CHOLX,CHOLP,CHLST,CHOLV,884269,HDL,HDLPOC,HDLPOCT,804503,NHDLCT,LCA011820,HDLC,HDLP,LDL,LDLPOCT,LDLCPOC,804502,NLDLCT,DLDL,LDLC,DLDLP,804564,VLDLC,VLDL,TGL,TGLX,TRIGL,LCA001174,TRIGP,TGLPOCT,804563,884256,CHHD,CHHDX)@   BNP No results for input(s): BNPP in the last 72 hours.   Liver Enzymes No results for input(s): TP, ALB, TBIL, AP in the last 72 hours.    No lab exists for component: SGOT, GPT, DBIL   Thyroid Studies Lab Results   Component Value Date/Time    TSH 1.870 04/02/2019 04:56 PM            Discharge Exam:  Visit Vitals  BP 121/74 (BP 1 Location: Right arm, BP Patient Position: At rest)   Pulse 90   Temp 98.3 ??F (36.8 ??C)   Resp 20   Ht 5\' 2"  (1.575 m)   Wt 49.9 kg (110 lb)   LMP  (LMP Unknown)   SpO2 91%   BMI 20.12 kg/m??     General:          No acute distress????aphasic, very weak  Lungs:             CTA Bilaterally.   Heart:              Regular rate and rhythm,?? No murmur, rub, or gallop  Abdomen:        Soft, Non distended, Non tender, Positive bowel sounds. Ileostomy bag in place  Extremities:     No cyanosis, clubbing or edema, s/p left hip fracture repair, dressing in place   Neurologic:??     No focal deficits      Disposition: Rehab  Discharge Condition: stable  Patient Instructions:   Current Discharge Medication List      START taking these medications    Details   aspirin (ASPIRIN) 325 mg tablet Take 1 Tab by mouth daily for 30 days.  Qty: 30 Tab, Refills: 0      calcium-vitamin D (OYSTER SHELL) 500 mg(1,250mg ) -200 unit per tablet Take 1 Tab by mouth three (3) times daily (with meals) for 30 days.  Qty: 90 Tab, Refills: 0         CONTINUE these medications which have NOT CHANGED    Details   traZODone (DESYREL) 50 mg tablet Take 1 Tab by mouth nightly.  Qty: 3 Tab, Refills: 0      simvastatin (ZOCOR) 20 mg tablet TAKE 1 TABLET BY MOUTH EVERY DAY AT NIGHT  Qty: 90 Tab, Refills: 2      PARoxetine (PAXIL) 20 mg tablet Take 1 Tab by mouth daily.  Qty: 90 Tab, Refills: 2      food  supplemt, lactose-reduced (ENSURE ACTIVE CLEAR) liqd Take 237 mL by mouth four (4) times daily.  Qty: 30 Can, Refills: 3    Associated Diagnoses: Weight loss      calcium carb-vitamin D3-vit K2 600 mg-1,000 unit-90 mcg tab Take  by mouth.      ascorbic acid, vitamin C, (VITAMIN C) 1,000 mg tablet Take  by mouth.      ferrous sulfate (IRON) 325 mg (65 mg iron) tablet Take  by mouth Daily (before breakfast).         STOP taking these medications       senna-docusate (PERICOLACE) 8.6-50 mg per tablet Comments:   Reason for Stopping:         calcium carbonate (OS-CAL) 500 mg calcium (1,250 mg) tablet Comments:   Reason for Stopping:               Activity: Activity as tolerated  Diet: Regular Diet  Wound Care: regular      DNR   Surrogate decision maker:  Pneumonia and flu vaccine to be administered at discharge per hospital protocol   Follow-up  ??   PCP and ortho  Time spent to discharge patient 35 minutes  Signed:  Malachi Pro, MD  05/16/2019  11:15 AM

## 2019-05-16 NOTE — Progress Notes (Signed)
Report [phone]

## 2019-05-17 LAB — SARS-COV-2
COVID-19 rapid test: NOT DETECTED
SARS CoV-2: NEGATIVE

## 2019-05-17 NOTE — Progress Notes (Signed)
Transition of care outreach postponed for 14 days due to patient's discharge to SNF.  Patient d/c to [facility]

## 2019-05-25 NOTE — Progress Notes (Signed)
Community Care Team documentation for patient in [place]    The information below provided by:[facility]    PT Update: 20FT X 2/CGA/RW -  MIN A TRANSFERS - MAX A ADLS PT/OT [id number]        Nursing Update:      Discharge Date:10//1/20 LTC [place] to [facility] Manager:n/a

## 2019-05-26 DIAGNOSIS — Z87891 Personal history of nicotine dependence: Secondary | ICD-10-CM | POA: Diagnosis not present

## 2019-05-26 DIAGNOSIS — R29810 Facial weakness: Secondary | ICD-10-CM | POA: Diagnosis not present

## 2019-05-26 DIAGNOSIS — M6281 Muscle weakness (generalized): Secondary | ICD-10-CM | POA: Diagnosis not present

## 2019-05-26 DIAGNOSIS — R404 Transient alteration of awareness: Secondary | ICD-10-CM | POA: Diagnosis not present

## 2019-05-26 DIAGNOSIS — Z79899 Other long term (current) drug therapy: Secondary | ICD-10-CM | POA: Diagnosis not present

## 2019-05-26 DIAGNOSIS — Z20828 Contact with and (suspected) exposure to other viral communicable diseases: Secondary | ICD-10-CM | POA: Diagnosis not present

## 2019-05-26 DIAGNOSIS — I10 Essential (primary) hypertension: Secondary | ICD-10-CM | POA: Diagnosis not present

## 2019-05-26 DIAGNOSIS — R4182 Altered mental status, unspecified: Secondary | ICD-10-CM | POA: Diagnosis not present

## 2019-05-26 DIAGNOSIS — R410 Disorientation, unspecified: Secondary | ICD-10-CM | POA: Diagnosis not present

## 2019-05-26 DIAGNOSIS — F0390 Unspecified dementia without behavioral disturbance: Secondary | ICD-10-CM | POA: Diagnosis not present

## 2019-05-26 DIAGNOSIS — R531 Weakness: Secondary | ICD-10-CM | POA: Diagnosis not present

## 2019-05-26 DIAGNOSIS — R509 Fever, unspecified: Secondary | ICD-10-CM | POA: Diagnosis not present

## 2019-05-26 DIAGNOSIS — G459 Transient cerebral ischemic attack, unspecified: Secondary | ICD-10-CM | POA: Diagnosis not present

## 2019-05-27 DIAGNOSIS — R4182 Altered mental status, unspecified: Secondary | ICD-10-CM | POA: Diagnosis not present

## 2019-05-27 DIAGNOSIS — G459 Transient cerebral ischemic attack, unspecified: Secondary | ICD-10-CM | POA: Diagnosis not present

## 2019-05-30 ENCOUNTER — Encounter (HOSPITAL_COMMUNITY): Payer: Self-pay

## 2019-05-30 ENCOUNTER — Emergency Department (HOSPITAL_COMMUNITY): Payer: Medicare PPO

## 2019-05-30 ENCOUNTER — Other Ambulatory Visit: Payer: Self-pay

## 2019-05-30 ENCOUNTER — Telehealth: Payer: Self-pay | Admitting: Family Medicine

## 2019-05-30 ENCOUNTER — Emergency Department (HOSPITAL_COMMUNITY)
Admission: EM | Admit: 2019-05-30 | Discharge: 2019-05-30 | Disposition: A | Discharge status: Home / Self Care | Payer: Medicare PPO | Attending: Emergency Medicine | Admitting: Emergency Medicine

## 2019-05-30 ENCOUNTER — Inpatient Hospital Stay: Admit: 2019-05-30 | Payer: MEDICARE | Attending: Diagnostic Radiology | Primary: Community Health

## 2019-05-30 ENCOUNTER — Encounter

## 2019-05-30 DIAGNOSIS — L0291 Cutaneous abscess, unspecified: Secondary | ICD-10-CM

## 2019-05-30 DIAGNOSIS — R55 Syncope and collapse: Secondary | ICD-10-CM | POA: Diagnosis not present

## 2019-05-30 DIAGNOSIS — Z79899 Other long term (current) drug therapy: Secondary | ICD-10-CM | POA: Insufficient documentation

## 2019-05-30 DIAGNOSIS — Z87891 Personal history of nicotine dependence: Secondary | ICD-10-CM | POA: Diagnosis not present

## 2019-05-30 DIAGNOSIS — I10 Essential (primary) hypertension: Secondary | ICD-10-CM | POA: Diagnosis not present

## 2019-05-30 DIAGNOSIS — R4182 Altered mental status, unspecified: Secondary | ICD-10-CM | POA: Diagnosis not present

## 2019-05-30 DIAGNOSIS — R531 Weakness: Secondary | ICD-10-CM

## 2019-05-30 DIAGNOSIS — Z7982 Long term (current) use of aspirin: Secondary | ICD-10-CM | POA: Diagnosis not present

## 2019-05-30 DIAGNOSIS — R29818 Other symptoms and signs involving the nervous system: Secondary | ICD-10-CM | POA: Insufficient documentation

## 2019-05-30 LAB — CBC WITH DIFFERENTIAL/PLATELET
Abs Immature Granulocytes: 0.01 10*3/uL (ref 0.00–0.07)
Basophils Absolute: 0 10*3/uL (ref 0.0–0.1)
Basophils Relative: 1 %
Eosinophils Absolute: 0 10*3/uL (ref 0.0–0.5)
Eosinophils Relative: 0 %
HCT: 46.8 % — ABNORMAL HIGH (ref 36.0–46.0)
Hemoglobin: 14.9 g/dL (ref 12.0–15.0)
Immature Granulocytes: 0 %
Lymphocytes Relative: 25 %
Lymphs Abs: 1.9 10*3/uL (ref 0.7–4.0)
MCH: 30.3 pg (ref 26.0–34.0)
MCHC: 31.8 g/dL (ref 30.0–36.0)
MCV: 95.1 fL (ref 80.0–100.0)
Monocytes Absolute: 1.2 10*3/uL — ABNORMAL HIGH (ref 0.1–1.0)
Monocytes Relative: 15 %
Neutro Abs: 4.4 10*3/uL (ref 1.7–7.7)
Neutrophils Relative %: 59 %
Platelets: 208 10*3/uL (ref 150–400)
RBC: 4.92 MIL/uL (ref 3.87–5.11)
RDW: 13.9 % (ref 11.5–15.5)
WBC: 7.5 10*3/uL (ref 4.0–10.5)
nRBC: 0 % (ref 0.0–0.2)

## 2019-05-30 LAB — URINALYSIS, ROUTINE W REFLEX MICROSCOPIC
Bacteria, UA: NONE SEEN
Bilirubin Urine: NEGATIVE
Glucose, UA: NEGATIVE mg/dL
Ketones, ur: NEGATIVE mg/dL
Leukocytes,Ua: NEGATIVE
Nitrite: NEGATIVE
Protein, ur: 30 mg/dL — AB
Specific Gravity, Urine: 1.017 (ref 1.005–1.030)
pH: 6 (ref 5.0–8.0)

## 2019-05-30 LAB — COMPREHENSIVE METABOLIC PANEL
ALT: 27 U/L (ref 0–44)
AST: 25 U/L (ref 15–41)
Albumin: 4.7 g/dL (ref 3.5–5.0)
Alkaline Phosphatase: 67 U/L (ref 38–126)
Anion gap: 13 (ref 5–15)
BUN: 18 mg/dL (ref 8–23)
CO2: 28 mmol/L (ref 22–32)
Calcium: 9.2 mg/dL (ref 8.9–10.3)
Chloride: 100 mmol/L (ref 98–111)
Creatinine, Ser: 0.75 mg/dL (ref 0.44–1.00)
GFR calc Af Amer: >60 mL/min (ref >60)
GFR calc non Af Amer: >60 mL/min (ref >60)
Glucose, Bld: 107 mg/dL — ABNORMAL HIGH (ref 70–99)
Potassium: 4 mmol/L (ref 3.5–5.1)
Sodium: 141 mmol/L (ref 135–145)
Total Bilirubin: 0.8 mg/dL (ref 0.3–1.2)
Total Protein: 8 g/dL (ref 6.5–8.1)

## 2019-05-30 MED ORDER — OXYCODONE 5 MG TAB
5 mg | ORAL | Status: AC
Start: 2019-05-30 — End: ?

## 2019-05-30 MED ORDER — OXYCODONE 5 MG TAB
5 mg | Freq: Once | ORAL | Status: AC
Start: 2019-05-30 — End: 2019-05-30
  Administered 2019-05-30: 14:00:00 via ORAL

## 2019-05-30 MED ORDER — LIDOCAINE HCL 2 % (20 MG/ML) IJ SOLN
20 mg/mL (2 %) | Freq: Once | INTRAMUSCULAR | Status: AC
Start: 2019-05-30 — End: 2019-05-30
  Administered 2019-05-30: 14:00:00 via INTRADERMAL

## 2019-05-30 MED ORDER — DIAZEPAM 5 MG TAB
5 mg | Freq: Once | ORAL | Status: AC
Start: 2019-05-30 — End: 2019-05-30
  Administered 2019-05-30: 14:00:00 via ORAL

## 2019-05-30 MED ORDER — LIDOCAINE HCL 2 % (20 MG/ML) IJ SOLN
20 mg/mL (2 %) | INTRAMUSCULAR | Status: AC
Start: 2019-05-30 — End: ?

## 2019-05-30 MED ORDER — DIAZEPAM 5 MG TAB
5 mg | ORAL | Status: AC
Start: 2019-05-30 — End: ?

## 2019-05-30 MED ORDER — IOPAMIDOL 61 % IV SOLN
61 % | Freq: Once | INTRAVENOUS | Status: AC
Start: 2019-05-30 — End: 2019-05-30
  Administered 2019-05-30: 14:00:00

## 2019-05-30 MED FILL — XYLOCAINE 20 MG/ML (2 %) INJECTION SOLUTION: 20 mg/mL (2 %) | INTRAMUSCULAR | Qty: 20

## 2019-05-30 MED FILL — DIAZEPAM 5 MG TAB: 5 mg | ORAL | Qty: 2

## 2019-05-30 MED FILL — OXYCODONE 5 MG TAB: 5 mg | ORAL | Qty: 1

## 2019-05-30 NOTE — Progress Notes (Signed)
[facility] called to transport patient back to [facility].

## 2019-05-30 NOTE — Progress Notes (Signed)
IR Nurse Pre-Procedure Checklist Part 2          Consent signed: Yes    H&P complete:  Yes    Antibiotics: No    Airway/Mallampati Done: No    Shaved: No    Pregnancy Form:No    Patient Position: Yes    MD Side: Yes     Biopsy Worksheet: No    Specimen Medium: No

## 2019-05-30 NOTE — Progress Notes (Signed)
The patient was counseled at length about the risks of contracting Covid-19 during their perioperative period and any recovery window from their procedure.  The patient was made aware that contracting Covid-19  may worsen their prognosis for recovering from their procedure and lend to a higher morbidity and/or mortality risk.  All material risks, benefits, and reasonable alternatives including postponing the procedure were discussed. The patient does  wish to proceed with the procedure at this time.

## 2019-05-30 NOTE — Procedures (Signed)
Department of Interventional Radiology  [phone]        Interventional Radiology Brief Procedure Note    Patient: Cathy Jackson MRN: [id number]  SSN: [id number]    Date of Birth: 1945/11/26  Age: 74 y.o.  Sex: female      Date of Procedure: 05/30/2019    Pre-Procedure Diagnosis: Chronic NephU.  Ureteral obstruction.  Ileal conduit.     Post-Procedure Diagnosis: SAME    Procedure(s): NephU exchange.     Brief Description of Procedure: as Jethro Poling    Performed By: Vladimir Creeks, MD     Assistants: None    Anesthesia:None    Estimated Blood Loss: None    Specimens:  None    Implants:  Nephro-Ureteral Drain    Findings: Minimal encrustation.      Complications: None    Recommendations: Discharge.       Follow Up: 10 weeks.     Signed By: Vladimir Creeks, MD     May 30, 2019

## 2019-05-30 NOTE — ED Notes (Signed)
Patient transported to CT 

## 2019-05-30 NOTE — ED Triage Notes (Signed)
Pt reports Thursday she had left arm weakness and felt like mouth was "paralyzed."  Reports had a syncopal episode and was taken to [facility] and was admitted.  Reports discharged on Friday and daughter in law said she told them at [facility] she wanted to leave.  Pt says she notified her PCP, Dr. Buelah Manis, and was told to come to [facility].  Pt told family she can't remember some things that happened last week.  PT says today she feels better but still not back to herself.  Pt says her speech still isn't right and her thinking is not right.  Pt says has been feeling weak for "a while" and today not feeling as strong as usual.

## 2019-05-30 NOTE — ED Provider Notes (Signed)
[facility] EMERGENCY DEPARTMENT Provider Note   CSN: [id number] Arrival date & time: 05/30/19  [id number]     History   Chief Complaint Chief Complaint  Patient presents with   Weakness    HPI Molly Patel is a 73 y.o. female.     HPI Patient presents to the emergency department with an episode this past Thursday where she had some alteration in her normal mentation and it sounds like had a syncopal episode.  She was taken to [facility].  They did do an MRI which they state may have shown a small stroke.  The patient left on Friday.  She contacted her primary doctor.  They sent her to the emergency department.  The patient states that she is feeling better but was unclear of those activities that occurred this past Thursday.  She states that nothing seems to make the condition better or worse.  She states that at this point she was advised to get reexamined by her doctor in the emergency department.  The patient denies chest pain, shortness of breath, headache,blurred vision, neck pain, fever, cough, weakness, numbness, dizziness, anorexia, edema, abdominal pain, nausea, vomiting, diarrhea, rash, back pain, dysuria, hematemesis, bloody stool.  Past Medical History:  Diagnosis Date   Angioedema    Depression    Dizziness    Echocardiogram with ECG monitoring july 2012   mild LVH, normal event monitor    Hyperlipidemia    Hypertension    takes meds daily   Incontinence    Insomnia    Ovarian cyst    Seizures ([place])    2 weeks ago   Stroke [facility]) 2016   Syncope    Vitamin D deficiency     Patient Active Problem List   Diagnosis Date Noted   Glaucoma 09/21/2018   Full code status 09/21/2018   Cognitive changes 09/21/2017   Pain in joint, shoulder region 10/01/2015   History of CVA (cerebrovascular accident) 05/28/2015   Risk for falls 04/24/2014   Nasal congestion 12/16/2013   Hyperlipidemia 08/15/2013   S/P craniotomy 08/15/2013   Dry eye  09/16/2012   Insomnia 09/16/2012   OAB (overactive bladder) 09/16/2012   Lumbar stenosis 05/16/2012   Fecal occult blood test positive 01/23/2012   Knee pain 10/06/2011   Pedal edema 09/05/2011   Dyspnea 04/24/2011   Syncope 04/24/2011   Hypertension     Past Surgical History:  Procedure Laterality Date   ABDOMINAL HYSTERECTOMY     COLONOSCOPY N/A 11/02/2014   Procedure: COLONOSCOPY;  Surgeon: Rogene Houston, MD;  Location: [facility] SUITE;  Service: Endoscopy;  Laterality: N/A;  140 - moved to 12:55 - Ann to notify pt   CRANIOTOMY  06/29/2012   Procedure: CRANIOTOMY TUMOR EXCISION;  Surgeon: Faythe Ghee, MD;  Location: [facility];  Service: Neurosurgery;  Laterality: Left;  Left frontal Craniotomy for Meningioma   ESOPHAGEAL DILATION N/A 11/02/2014   Procedure: ESOPHAGEAL DILATION;  Surgeon: Rogene Houston, MD;  Location: [facility] SU[facility];  Service: Endoscopy;  Laterality: N/A;   ESOPHAGOGASTRODUODENOSCOPY N/A 11/02/2014   Procedure: ESOPHAGOGASTRODUODENOSCOPY (EGD);  Surgeon: Rogene Houston, MD;  Location: [facility] SU[facility];  Service: Endoscopy;  Laterality: N/A;   VESICOVAGINAL FISTULA CLOSURE W/ TAH       OB History   No obstetric history on file.      Home Medications    Prior to Admission medications   Medication Sig Start Date End Date Taking? Authorizing Provider  aspirin EC  325 MG tablet Take 1 tablet (325 mg total) by mouth daily. 05/28/15   [place], Modena Nunnery, MD  atenolol (TENORMIN) 50 MG tablet TAKE 1 TABLET EVERY DAY Patient taking differently: Take 50 mg by mouth daily.  04/20/19   [place], Modena Nunnery, MD  atorvastatin (LIPITOR) 10 MG tablet TAKE 1 TABLET EVERY DAY Patient taking differently: Take 10 mg by mouth daily.  01/12/19   Alycia Rossetti, MD  cholecalciferol (VITAMIN D) 25 MCG (1000 UT) tablet TAKE 1 TABLET EVERY DAY Patient taking differently: Take 1,000 Units by mouth daily.  01/12/19   [place], Modena Nunnery, MD  donepezil (ARICEPT) 5 MG tablet  TAKE 1 TABLET AT BEDTIME Patient taking differently: Take 5 mg by mouth at bedtime.  04/15/19   [place], Modena Nunnery, MD  furosemide (LASIX) 40 MG tablet TAKE 1 TABLET EVERY DAY AS NEEDED Patient taking differently: Take 40 mg by mouth daily as needed. TAKE 1 TABLET EVERY DAY AS NEEDED 06/17/17   Alycia Rossetti, MD  hydrochlorothiazide (HYDRODIURIL) 25 MG tablet TAKE 1/2 TABLET EVERY DAY Patient taking differently: Take 12.5 mg by mouth daily.  11/06/17   [place], Modena Nunnery, MD  levETIRAcetam (KEPPRA) 500 MG tablet TAKE 1 TABLET TWICE DAILY (STOP DILANTIN) Patient taking differently: Take 500 mg by mouth 2 (two) times daily.  12/23/18   Alycia Rossetti, MD    Family History Family History  Problem Relation Age of Onset   Stroke Father    Asthma Grandchild    Arthritis Sister    Diabetes Sister    Colon cancer Brother     Social History Social History   Tobacco Use   Smoking status: Former Smoker    Years: 7.00    Types: Cigarettes    Quit date: 08/25/1985    Years since quitting: 33.7   Smokeless tobacco: Never Used  Substance Use Topics   Alcohol use: No   Drug use: No     Allergies   Patient has no known allergies.   Review of Systems Review of Systems All other systems negative except as documented in the HPI. All pertinent positives and negatives as reviewed in the HPI. Physical Exam Updated Vital Signs BP (!) 149/74    Pulse 60    Temp 98 F (36.7 C) (Oral)    Resp 18    Ht 5\' 7"  (1.702 m)    Wt 78 kg    SpO2 96%    BMI 26.94 kg/m   Physical Exam Vitals signs and nursing note reviewed.  Constitutional:      General: She is not in acute distress.    Appearance: She is well-developed.  HENT:     Head: Normocephalic and atraumatic.  Eyes:     Pupils: Pupils are equal, round, and reactive to light.  Neck:     Musculoskeletal: Normal range of motion and neck supple.  Cardiovascular:     Rate and Rhythm: Normal rate and regular rhythm.     Heart  sounds: Normal heart sounds. No murmur. No friction rub. No gallop.   Pulmonary:     Effort: Pulmonary effort is normal. No respiratory distress.     Breath sounds: Normal breath sounds. No wheezing.  Abdominal:     General: Bowel sounds are normal. There is no distension.     Palpations: Abdomen is soft.     Tenderness: There is no abdominal tenderness.  Skin:    General: Skin is warm and dry.  Capillary Refill: Capillary refill takes less than 2 seconds.     Findings: No erythema or rash.  Neurological:     Mental Status: She is alert and oriented to person, place, and time.     Motor: No abnormal muscle tone.     Coordination: Coordination normal.  Psychiatric:        Behavior: Behavior normal.      ED Treatments / Results  Labs (all labs ordered are listed, but only abnormal results are displayed) Labs Reviewed  COMPREHENSIVE METABOLIC PANEL - Abnormal; Notable for the following components:      Result Value   Glucose, Bld 107 (*)    All other components within normal limits  CBC WITH DIFFERENTIAL/PLATELET - Abnormal; Notable for the following components:   HCT 46.8 (*)    Monocytes Absolute 1.2 (*)    All other components within normal limits  URINALYSIS, ROUTINE W REFLEX MICROSCOPIC - Abnormal; Notable for the following components:   Hgb urine dipstick SMALL (*)    Protein, ur 30 (*)    All other components within normal limits    EKG None  Radiology Ct Head Wo Contrast  Result Date: 05/30/2019 CLINICAL DATA:  Recent hospitalization for left arm weakness and syncopal episode. Persistent weakness and speech difficulties. History of meningioma resection. EXAM: CT HEAD WITHOUT CONTRAST TECHNIQUE: Contiguous axial images were obtained from the base of the skull through the vertex without intravenous contrast. COMPARISON:  CT head and MRI brain 05/26/2019.  Head CT 05/02/2015. FINDINGS: Brain: There is no evidence of acute intracranial hemorrhage, mass lesion, brain  edema or extra-axial fluid collection. The ventricles and subarachnoid spaces are appropriately sized for age. There is stable encephalomalacia in the left frontal lobe related to previous meningioma resection. Scattered small vessel ischemic changes in the periventricular white matter are stable. There is no evidence of acute cortical based infarct. Vascular:  No hyperdense vessel identified. Skull: No acute osseous findings. Stable postsurgical changes from previous left frontal craniotomy. Sinuses/Orbits: The visualized paranasal sinuses and mastoid air cells are clear. No orbital abnormalities are seen. Other: Low-density expansion of the sella turcica again noted, unchanged. IMPRESSION: 1. Stable examination without acute intracranial findings. 2. Stable sequela of prior left frontal meningioma resection and chronic expansion of the sella turcica. Electronically Signed   By: Richardean Sale M.D.   On: 05/30/2019 13:45    Procedures Procedures (including critical care time)  Medications Ordered in ED Medications - No data to display   Initial Impression / Assessment and Plan / ED Course  I have reviewed the triage vital signs and the nursing notes.  Pertinent labs & imaging results that were available during my care of the patient were reviewed by me and considered in my medical decision making (see chart for details).       Patient has negative imaging and will receive his MRI to look for further issues.  Have advised the patient of the results thus far.  Have advised the patient that if her testing does not show any significant abnormality she most likely be discharged home with close follow-up with her primary doctor.  Patient agrees this plan and all questions were answered. Final Clinical Impressions(s) / ED Diagnoses   Final diagnoses:  None    ED Discharge Orders    None       Dalia Heading, PA-C 05/30/19 1626    Milton Ferguson, MD 06/01/19 1137

## 2019-05-30 NOTE — Discharge Instructions (Addendum)
You were seen in the emergency department today at request of your primary care provider.  Your work-up in the ER was overall reassuring.  Your labs and imaging did not show any significant new abnormalities.  Your MRI showed some changes that were old, we have given you a detailed report to share with your primary care provider to discuss this at the bottom of your discharge instructions.  Please follow-up with your primary care provider within 2 days.  Please return to the ER for new or worsening symptoms including but not limited to numbness or weakness to a certain arm or leg, passing out, chest pain, trouble breathing, fever, or any other concerns.  Results for orders placed or performed during the hospital encounter of 05/30/19  Comprehensive metabolic panel  Result Value Ref Range   Sodium 141 135 - 145 mmol/L   Potassium 4.0 3.5 - 5.1 mmol/L   Chloride 100 98 - 111 mmol/L   CO2 28 22 - 32 mmol/L   Glucose, Bld 107 (H) 70 - 99 mg/dL   BUN 18 8 - 23 mg/dL   Creatinine, Ser 0.75 0.44 - 1.00 mg/dL   Calcium 9.2 8.9 - 10.3 mg/dL   Total Protein 8.0 6.5 - 8.1 g/dL   Albumin 4.7 3.5 - 5.0 g/dL   AST 25 15 - 41 U/L   ALT 27 0 - 44 U/L   Alkaline Phosphatase 67 38 - 126 U/L   Total Bilirubin 0.8 0.3 - 1.2 mg/dL   GFR calc non Af Amer >60 >60 mL/min   GFR calc Af Amer >60 >60 mL/min   Anion gap 13 5 - 15  CBC with Differential  Result Value Ref Range   WBC 7.5 4.0 - 10.5 K/uL   RBC 4.92 3.87 - 5.11 MIL/uL   Hemoglobin 14.9 12.0 - 15.0 g/dL   HCT 46.8 (H) 36.0 - 46.0 %   MCV 95.1 80.0 - 100.0 fL   MCH 30.3 26.0 - 34.0 pg   MCHC 31.8 30.0 - 36.0 g/dL   RDW 13.9 11.5 - 15.5 %   Platelets 208 150 - 400 K/uL   nRBC 0.0 0.0 - 0.2 %   Neutrophils Relative % 59 %   Neutro Abs 4.4 1.7 - 7.7 K/uL   Lymphocytes Relative 25 %   Lymphs Abs 1.9 0.7 - 4.0 K/uL   Monocytes Relative 15 %   Monocytes Absolute 1.2 (H) 0.1 - 1.0 K/uL   Eosinophils Relative 0 %   Eosinophils Absolute 0.0 0.0 -  0.5 K/uL   Basophils Relative 1 %   Basophils Absolute 0.0 0.0 - 0.1 K/uL   Immature Granulocytes 0 %   Abs Immature Granulocytes 0.01 0.00 - 0.07 K/uL  Urinalysis, Routine w reflex microscopic  Result Value Ref Range   Color, Urine YELLOW YELLOW   APPearance CLEAR CLEAR   Specific Gravity, Urine 1.017 1.005 - 1.030   pH 6.0 5.0 - 8.0   Glucose, UA NEGATIVE NEGATIVE mg/dL   Hgb urine dipstick SMALL (A) NEGATIVE   Bilirubin Urine NEGATIVE NEGATIVE   Ketones, ur NEGATIVE NEGATIVE mg/dL   Protein, ur 30 (A) NEGATIVE mg/dL   Nitrite NEGATIVE NEGATIVE   Leukocytes,Ua NEGATIVE NEGATIVE   RBC / HPF 0-5 0 - 5 RBC/hpf   WBC, UA 0-5 0 - 5 WBC/hpf   Bacteria, UA NONE SEEN NONE SEEN   Squamous Epithelial / LPF 0-5 0 - 5   Ct Head Wo Contrast  Result Date: 05/30/2019 CLINICAL DATA:  Recent hospitalization for left arm weakness and syncopal episode. Persistent weakness and speech difficulties. History of meningioma resection. EXAM: CT HEAD WITHOUT CONTRAST TECHNIQUE: Contiguous axial images were obtained from the base of the skull through the vertex without intravenous contrast. COMPARISON:  CT head and MRI brain 05/26/2019.  Head CT 05/02/2015. FINDINGS: Brain: There is no evidence of acute intracranial hemorrhage, mass lesion, brain edema or extra-axial fluid collection. The ventricles and subarachnoid spaces are appropriately sized for age. There is stable encephalomalacia in the left frontal lobe related to previous meningioma resection. Scattered small vessel ischemic changes in the periventricular white matter are stable. There is no evidence of acute cortical based infarct. Vascular:  No hyperdense vessel identified. Skull: No acute osseous findings. Stable postsurgical changes from previous left frontal craniotomy. Sinuses/Orbits: The visualized paranasal sinuses and mastoid air cells are clear. No orbital abnormalities are seen. Other: Low-density expansion of the sella turcica again noted,  unchanged. IMPRESSION: 1. Stable examination without acute intracranial findings. 2. Stable sequela of prior left frontal meningioma resection and chronic expansion of the sella turcica. Electronically Signed   By: Richardean Sale M.D.   On: 05/30/2019 13:45   Mr Angio Head Wo Contrast  Result Date: 05/30/2019 CLINICAL DATA:  Altered mental status and weakness for 2 days. History of meningioma resection. EXAM: MRI HEAD WITHOUT CONTRAST MRA HEAD WITHOUT CONTRAST TECHNIQUE: Multiplanar, multiecho pulse sequences of the brain and surrounding structures were obtained without intravenous contrast. Angiographic images of the head were obtained using MRA technique without contrast. COMPARISON:  Head CT 05/30/2019, MRI 05/26/2019, and MRA 07/10/2015 FINDINGS: MRI HEAD FINDINGS Brain: There is no evidence of acute infarct, intracranial mass effect, or extra-axial fluid collection. Encephalomalacia and gliosis in the left frontal lobe related to prior meningioma resection are unchanged. Scattered T2 hyperintensities elsewhere in the cerebral white matter bilaterally are unchanged and nonspecific but compatible with mild chronic small vessel ischemic disease. Numerous chronic microhemorrhages are again seen peripherally in the predominantly posterior aspects of both cerebral hemispheres. There is mild cerebral atrophy. An enlarged sella turcica is again noted with intraosseous herniation of CSF, unchanged. The optic chiasm is again noted to be retracted inferiorly. Vascular: Major intracranial vascular flow voids are preserved. Skull and upper cervical spine: Left frontal craniotomy. Sinuses/Orbits: Unremarkable orbits. Paranasal sinuses and mastoid air cells are clear. Other: None. MRA HEAD FINDINGS The study is mildly motion degraded. The visualized distal vertebral arteries are patent to the basilar with the right being strongly dominant. Patent right PICA, left AICA, and bilateral SCA origins are visualized. The  basilar artery is widely patent. Posterior communicating arteries are not clearly identified and may be diminutive or absent. PCAs are patent without evidence of significant proximal stenosis. The internal carotid arteries are widely patent from skull base to carotid termini. ACAs and MCAs are patent without evidence of proximal branch occlusion or flow limiting proximal stenosis within limitations of mild motion artifact through the branch vessels. No aneurysm is identified. IMPRESSION: 1. No acute intracranial abnormality. 2. Unchanged left frontal lobe encephalomalacia related to prior meningioma resection. 3. Mild chronic small vessel ischemic disease. 4. Chronic cerebral microhemorrhages which could indicate underlying cerebral amyloid angiopathy. 5. Patent Circle of Willis without evidence of proximal branch occlusion or flow limiting proximal stenosis. Electronically Signed   By: Logan Bores M.D.   On: 05/30/2019 16:41   Mr Brain Wo Contrast (neuro Protocol)  Result Date: 05/30/2019 CLINICAL DATA:  Altered mental status and weakness for 2 days. History  of meningioma resection. EXAM: MRI HEAD WITHOUT CONTRAST MRA HEAD WITHOUT CONTRAST TECHNIQUE: Multiplanar, multiecho pulse sequences of the brain and surrounding structures were obtained without intravenous contrast. Angiographic images of the head were obtained using MRA technique without contrast. COMPARISON:  Head CT 05/30/2019, MRI 05/26/2019, and MRA 07/10/2015 FINDINGS: MRI HEAD FINDINGS Brain: There is no evidence of acute infarct, intracranial mass effect, or extra-axial fluid collection. Encephalomalacia and gliosis in the left frontal lobe related to prior meningioma resection are unchanged. Scattered T2 hyperintensities elsewhere in the cerebral white matter bilaterally are unchanged and nonspecific but compatible with mild chronic small vessel ischemic disease. Numerous chronic microhemorrhages are again seen peripherally in the predominantly  posterior aspects of both cerebral hemispheres. There is mild cerebral atrophy. An enlarged sella turcica is again noted with intraosseous herniation of CSF, unchanged. The optic chiasm is again noted to be retracted inferiorly. Vascular: Major intracranial vascular flow voids are preserved. Skull and upper cervical spine: Left frontal craniotomy. Sinuses/Orbits: Unremarkable orbits. Paranasal sinuses and mastoid air cells are clear. Other: None. MRA HEAD FINDINGS The study is mildly motion degraded. The visualized distal vertebral arteries are patent to the basilar with the right being strongly dominant. Patent right PICA, left AICA, and bilateral SCA origins are visualized. The basilar artery is widely patent. Posterior communicating arteries are not clearly identified and may be diminutive or absent. PCAs are patent without evidence of significant proximal stenosis. The internal carotid arteries are widely patent from skull base to carotid termini. ACAs and MCAs are patent without evidence of proximal branch occlusion or flow limiting proximal stenosis within limitations of mild motion artifact through the branch vessels. No aneurysm is identified. IMPRESSION: 1. No acute intracranial abnormality. 2. Unchanged left frontal lobe encephalomalacia related to prior meningioma resection. 3. Mild chronic small vessel ischemic disease. 4. Chronic cerebral microhemorrhages which could indicate underlying cerebral amyloid angiopathy. 5. Patent Circle of Willis without evidence of proximal branch occlusion or flow limiting proximal stenosis. Electronically Signed   By: Logan Bores M.D.   On: 05/30/2019 16:41

## 2019-05-30 NOTE — ED Provider Notes (Signed)
16;30: Assumed care of patient from [place] PA-C at change of shift pending MRI.   Please see prior provider note for full H&P.  Briefly patient is a 73 year old female who presented to the emergency department Patient was recently admitted to [facility] for what sounds to have been some degree of altered mental status with a syncopal episode.  She was discharged 3 days prior.  Follow-up with primary care and was instructed to come to the emergency department for re-assessment. Per patient & family has been doing somewhat better.   ER work-up reviewed: CBC: No leukocytosis or anemia CMP: No significant electrolyte derangement.  Renal function preserved  Urinalysis: No UTI CT head: Stable exam without acute findings  MRI/MRA: 1. No acute intracranial abnormality. 2. Unchanged left frontal lobe encephalomalacia related to prior meningioma resection. 3. Mild chronic small vessel ischemic disease. 4. Chronic cerebral microhemorrhages which could indicate underlying cerebral amyloid angiopathy. 5. Patent Circle of Willis without evidence of proximal branch occlusion or flow limiting proximal stenosis.  No acute findings on work-up today. MRI without acute abnormality.  Plan to discharge home per discussion w/ AM team. Discussed w/ attending@ change of shift who is in agreement.   Results for orders placed or performed during the hospital encounter of 05/30/19  Comprehensive metabolic panel  Result Value Ref Range   Sodium 141 135 - 145 mmol/L   Potassium 4.0 3.5 - 5.1 mmol/L   Chloride 100 98 - 111 mmol/L   CO2 28 22 - 32 mmol/L   Glucose, Bld 107 (H) 70 - 99 mg/dL   BUN 18 8 - 23 mg/dL   Creatinine, Ser 0.75 0.44 - 1.00 mg/dL   Calcium 9.2 8.9 - 10.3 mg/dL   Total Protein 8.0 6.5 - 8.1 g/dL   Albumin 4.7 3.5 - 5.0 g/dL   AST 25 15 - 41 U/L   ALT 27 0 - 44 U/L   Alkaline Phosphatase 67 38 - 126 U/L   Total Bilirubin 0.8 0.3 - 1.2 mg/dL   GFR calc non Af Amer >60 >60 mL/min   GFR calc  Af Amer >60 >60 mL/min   Anion gap 13 5 - 15  CBC with Differential  Result Value Ref Range   WBC 7.5 4.0 - 10.5 K/uL   RBC 4.92 3.87 - 5.11 MIL/uL   Hemoglobin 14.9 12.0 - 15.0 g/dL   HCT 46.8 (H) 36.0 - 46.0 %   MCV 95.1 80.0 - 100.0 fL   MCH 30.3 26.0 - 34.0 pg   MCHC 31.8 30.0 - 36.0 g/dL   RDW 13.9 11.5 - 15.5 %   Platelets 208 150 - 400 K/uL   nRBC 0.0 0.0 - 0.2 %   Neutrophils Relative % 59 %   Neutro Abs 4.4 1.7 - 7.7 K/uL   Lymphocytes Relative 25 %   Lymphs Abs 1.9 0.7 - 4.0 K/uL   Monocytes Relative 15 %   Monocytes Absolute 1.2 (H) 0.1 - 1.0 K/uL   Eosinophils Relative 0 %   Eosinophils Absolute 0.0 0.0 - 0.5 K/uL   Basophils Relative 1 %   Basophils Absolute 0.0 0.0 - 0.1 K/uL   Immature Granulocytes 0 %   Abs Immature Granulocytes 0.01 0.00 - 0.07 K/uL  Urinalysis, Routine w reflex microscopic  Result Value Ref Range   Color, Urine YELLOW YELLOW   APPearance CLEAR CLEAR   Specific Gravity, Urine 1.017 1.005 - 1.030   pH 6.0 5.0 - 8.0   Glucose, UA NEGATIVE  NEGATIVE mg/dL   Hgb urine dipstick SMALL (A) NEGATIVE   Bilirubin Urine NEGATIVE NEGATIVE   Ketones, ur NEGATIVE NEGATIVE mg/dL   Protein, ur 30 (A) NEGATIVE mg/dL   Nitrite NEGATIVE NEGATIVE   Leukocytes,Ua NEGATIVE NEGATIVE   RBC / HPF 0-5 0 - 5 RBC/hpf   WBC, UA 0-5 0 - 5 WBC/hpf   Bacteria, UA NONE SEEN NONE SEEN   Squamous Epithelial / LPF 0-5 0 - 5   Ct Head Wo Contrast  Result Date: 05/30/2019 CLINICAL DATA:  Recent hospitalization for left arm weakness and syncopal episode. Persistent weakness and speech difficulties. History of meningioma resection. EXAM: CT HEAD WITHOUT CONTRAST TECHNIQUE: Contiguous axial images were obtained from the base of the skull through the vertex without intravenous contrast. COMPARISON:  CT head and MRI brain 05/26/2019.  Head CT 05/02/2015. FINDINGS: Brain: There is no evidence of acute intracranial hemorrhage, mass lesion, brain edema or extra-axial fluid  collection. The ventricles and subarachnoid spaces are appropriately sized for age. There is stable encephalomalacia in the left frontal lobe related to previous meningioma resection. Scattered small vessel ischemic changes in the periventricular white matter are stable. There is no evidence of acute cortical based infarct. Vascular:  No hyperdense vessel identified. Skull: No acute osseous findings. Stable postsurgical changes from previous left frontal craniotomy. Sinuses/Orbits: The visualized paranasal sinuses and mastoid air cells are clear. No orbital abnormalities are seen. Other: Low-density expansion of the sella turcica again noted, unchanged. IMPRESSION: 1. Stable examination without acute intracranial findings. 2. Stable sequela of prior left frontal meningioma resection and chronic expansion of the sella turcica. Electronically Signed   By: Richardean Sale M.D.   On: 05/30/2019 13:45   Mr Angio Head Wo Contrast  Result Date: 05/30/2019 CLINICAL DATA:  Altered mental status and weakness for 2 days. History of meningioma resection. EXAM: MRI HEAD WITHOUT CONTRAST MRA HEAD WITHOUT CONTRAST TECHNIQUE: Multiplanar, multiecho pulse sequences of the brain and surrounding structures were obtained without intravenous contrast. Angiographic images of the head were obtained using MRA technique without contrast. COMPARISON:  Head CT 05/30/2019, MRI 05/26/2019, and MRA 07/10/2015 FINDINGS: MRI HEAD FINDINGS Brain: There is no evidence of acute infarct, intracranial mass effect, or extra-axial fluid collection. Encephalomalacia and gliosis in the left frontal lobe related to prior meningioma resection are unchanged. Scattered T2 hyperintensities elsewhere in the cerebral white matter bilaterally are unchanged and nonspecific but compatible with mild chronic small vessel ischemic disease. Numerous chronic microhemorrhages are again seen peripherally in the predominantly posterior aspects of both cerebral  hemispheres. There is mild cerebral atrophy. An enlarged sella turcica is again noted with intraosseous herniation of CSF, unchanged. The optic chiasm is again noted to be retracted inferiorly. Vascular: Major intracranial vascular flow voids are preserved. Skull and upper cervical spine: Left frontal craniotomy. Sinuses/Orbits: Unremarkable orbits. Paranasal sinuses and mastoid air cells are clear. Other: None. MRA HEAD FINDINGS The study is mildly motion degraded. The visualized distal vertebral arteries are patent to the basilar with the right being strongly dominant. Patent right PICA, left AICA, and bilateral SCA origins are visualized. The basilar artery is widely patent. Posterior communicating arteries are not clearly identified and may be diminutive or absent. PCAs are patent without evidence of significant proximal stenosis. The internal carotid arteries are widely patent from skull base to carotid termini. ACAs and MCAs are patent without evidence of proximal branch occlusion or flow limiting proximal stenosis within limitations of mild motion artifact through the branch vessels. No aneurysm  is identified. IMPRESSION: 1. No acute intracranial abnormality. 2. Unchanged left frontal lobe encephalomalacia related to prior meningioma resection. 3. Mild chronic small vessel ischemic disease. 4. Chronic cerebral microhemorrhages which could indicate underlying cerebral amyloid angiopathy. 5. Patent Circle of Willis without evidence of proximal branch occlusion or flow limiting proximal stenosis. Electronically Signed   By: Logan Bores M.D.   On: 05/30/2019 16:41   Mr Brain Wo Contrast (neuro Protocol)  Result Date: 05/30/2019 CLINICAL DATA:  Altered mental status and weakness for 2 days. History of meningioma resection. EXAM: MRI HEAD WITHOUT CONTRAST MRA HEAD WITHOUT CONTRAST TECHNIQUE: Multiplanar, multiecho pulse sequences of the brain and surrounding structures were obtained without intravenous  contrast. Angiographic images of the head were obtained using MRA technique without contrast. COMPARISON:  Head CT 05/30/2019, MRI 05/26/2019, and MRA 07/10/2015 FINDINGS: MRI HEAD FINDINGS Brain: There is no evidence of acute infarct, intracranial mass effect, or extra-axial fluid collection. Encephalomalacia and gliosis in the left frontal lobe related to prior meningioma resection are unchanged. Scattered T2 hyperintensities elsewhere in the cerebral white matter bilaterally are unchanged and nonspecific but compatible with mild chronic small vessel ischemic disease. Numerous chronic microhemorrhages are again seen peripherally in the predominantly posterior aspects of both cerebral hemispheres. There is mild cerebral atrophy. An enlarged sella turcica is again noted with intraosseous herniation of CSF, unchanged. The optic chiasm is again noted to be retracted inferiorly. Vascular: Major intracranial vascular flow voids are preserved. Skull and upper cervical spine: Left frontal craniotomy. Sinuses/Orbits: Unremarkable orbits. Paranasal sinuses and mastoid air cells are clear. Other: None. MRA HEAD FINDINGS The study is mildly motion degraded. The visualized distal vertebral arteries are patent to the basilar with the right being strongly dominant. Patent right PICA, left AICA, and bilateral SCA origins are visualized. The basilar artery is widely patent. Posterior communicating arteries are not clearly identified and may be diminutive or absent. PCAs are patent without evidence of significant proximal stenosis. The internal carotid arteries are widely patent from skull base to carotid termini. ACAs and MCAs are patent without evidence of proximal branch occlusion or flow limiting proximal stenosis within limitations of mild motion artifact through the branch vessels. No aneurysm is identified. IMPRESSION: 1. No acute intracranial abnormality. 2. Unchanged left frontal lobe encephalomalacia related to prior  meningioma resection. 3. Mild chronic small vessel ischemic disease. 4. Chronic cerebral microhemorrhages which could indicate underlying cerebral amyloid angiopathy. 5. Patent Circle of Willis without evidence of proximal branch occlusion or flow limiting proximal stenosis. Electronically Signed   By: Logan Bores M.D.   On: 05/30/2019 16:41      Amaryllis Dyke, PA-C 05/30/19 1714    Maudie Flakes, MD 05/30/19 2140

## 2019-05-30 NOTE — Telephone Encounter (Signed)
Patient's daughter in-law called stating that patient is having stroke like symptoms. States that patient originally had symptoms that started on last Thursday. She went out to the [facility] to meet with a friend and then next thing she knows she was waking up in the ER at [facility]. Daughter in-law states that patient is unable to recall anything that happened Thursday or Friday. ER informed patient that they believe that she had a mini stroke. She is states that she is still having some symptoms of possible stroke today but is unable to specify what symptoms she is having. This nurse advised her with her symptoms she needs to go to the ER for further evaluation. Patient's daughter in-law verbalized understanding.

## 2019-05-31 NOTE — Progress Notes (Signed)
No transition of care outreach indicated due to patient discharge to a [facility] facility.

## 2019-08-01 ENCOUNTER — Other Ambulatory Visit (HOSPITAL_COMMUNITY): Payer: Self-pay | Admitting: Family Medicine

## 2019-08-01 DIAGNOSIS — Z1231 Encounter for screening mammogram for malignant neoplasm of breast: Secondary | ICD-10-CM

## 2019-08-05 ENCOUNTER — Other Ambulatory Visit: Payer: Self-pay | Admitting: Family Medicine

## 2019-08-08 ENCOUNTER — Inpatient Hospital Stay: Admit: 2019-08-08 | Payer: MEDICARE | Attending: Diagnostic Radiology | Primary: Community Health

## 2019-08-08 ENCOUNTER — Encounter

## 2019-08-08 DIAGNOSIS — N1330 Unspecified hydronephrosis: Secondary | ICD-10-CM

## 2019-08-08 MED ORDER — LIDOCAINE HCL 2 % (20 MG/ML) IJ SOLN
20 mg/mL (2 %) | INTRAMUSCULAR | Status: DC | PRN
Start: 2019-08-08 — End: 2019-08-12
  Administered 2019-08-08: 15:00:00 via INTRADERMAL

## 2019-08-08 MED ORDER — IOPAMIDOL 61 % IV SOLN
61 % | Freq: Once | INTRAVENOUS | Status: AC
Start: 2019-08-08 — End: 2019-08-08
  Administered 2019-08-08: 15:00:00

## 2019-08-08 MED ORDER — DIAZEPAM 5 MG TAB
5 mg | ORAL | Status: AC
Start: 2019-08-08 — End: ?

## 2019-08-08 MED ORDER — OXYCODONE 5 MG TAB
5 mg | ORAL | Status: AC
Start: 2019-08-08 — End: ?

## 2019-08-08 MED ORDER — DIAZEPAM 5 MG TAB
5 mg | Freq: Once | ORAL | Status: AC
Start: 2019-08-08 — End: 2019-08-08
  Administered 2019-08-08: 15:00:00 via ORAL

## 2019-08-08 MED ORDER — OXYCODONE 5 MG TAB
5 mg | ORAL | Status: DC | PRN
Start: 2019-08-08 — End: 2019-08-12
  Administered 2019-08-08: 15:00:00 via ORAL

## 2019-08-08 MED ORDER — LIDOCAINE HCL 2 % (20 MG/ML) IJ SOLN
20 mg/mL (2 %) | INTRAMUSCULAR | Status: AC
Start: 2019-08-08 — End: ?

## 2019-08-08 MED FILL — DIAZEPAM 5 MG TAB: 5 mg | ORAL | Qty: 2

## 2019-08-08 MED FILL — OXYCODONE 5 MG TAB: 5 mg | ORAL | Qty: 2

## 2019-08-08 MED FILL — XYLOCAINE 20 MG/ML (2 %) INJECTION SOLUTION: 20 mg/mL (2 %) | INTRAMUSCULAR | Qty: 20

## 2019-08-08 NOTE — Progress Notes (Signed)
Pt to IR Suite 2 via stretcher.  IR Nurse Pre-Procedure Checklist Part 2          Consent signed: Yes    H&P complete:  Not applicable    Antibiotics: Not applicable    Airway/Mallampati Done: Not applicable    Shaved: Not applicable    Pregnancy Form:Not applicable    Patient Position: Yes    MD Side: Yes     Biopsy Worksheet: Not applicable    Specimen Medium: Not applicable

## 2019-08-08 NOTE — Progress Notes (Signed)
Creig Hines here to transport patient back to facility.

## 2019-08-08 NOTE — H&P (Signed)
Department of Interventional Radiology  [phone]    History and Physical    Patient:  Cathy Jackson MRN:  [id number]  SSN:  [id number]    Date of Birth:  December 04, 1945  Age:  73 y.o.  Sex:  female      Primary Care Provider:  Wilfred Lacy, NP  Referring Physician:  Vladimir Creeks, MD    Subjective:     Chief Complaint: drain maintenance    History of the Present Illness:  The patient is a 73 y.o. female with chronic right ureteral stenosis who presents for exchange of her nephroureteral drain. Hx bladder cancer, urostomy, dementia.  No drain related complaints.  NPO.      Past Medical History:   Diagnosis Date   ??? Abdominal pain, unspecified site 10/27/2012   ??? Arthritis     generalized   ??? Asthma 10/27/2012   ??? Bladder cancer ([place])    ??? Depression     managed with medication    ??? Dysuria 10/27/2012   ??? Environmental allergies     managed with medication    ??? GERD (gastroesophageal reflux disease) 10/27/2012   ??? History of kidney stones     states surgical removed   ??? Hypothyroidism     managed with medication    ??? Other "heavy-for-dates" infants     ureterectomy and ileal conduit   ??? Unspecified disorder of bladder 10/27/2012   ??? Urethral caruncle 10/27/2012   ??? Urinary frequency 10/27/2012   ??? Urinary tract infection, site not specified 10/27/2012     Past Surgical History:   Procedure Laterality Date   ??? CLOSE CYSTOSTOMY      cystoscopy/ turbt x 2   ??? HX BREAST BIOPSY Left 2000    x 2   ??? HX CESAREAN SECTION      x 1   ??? HX CHOLECYSTECTOMY     ??? HX NEPHROSTOMY  2014   ??? HX TUBAL LIGATION     ??? HX WRIST FRACTURE TX Left    ??? IR CHANGE EXTRNL NEPHROURETERAL CATH SI  05/30/2019   ??? IR CHANGE INTERNAL URETERAL STENT SI  02/15/2018   ??? IR CHANGE INTERNAL URETERAL STENT SI  04/15/2018   ??? IR CHANGE INTERNAL URETERAL STENT SI  06/14/2018   ??? IR CHANGE URET TUBE VIA ILEAL CONDUIT  09/07/2018   ??? IR CHANGE URET TUBE VIA ILEAL CONDUIT  12/20/2018   ??? IR CHANGE URET TUBE VIA ILEAL CONDUIT  03/21/2019    ??? IR CONVERT NEPHRO PERC RT TO NEPHROURETERAL CATH EXISTING SI  11/12/2017   ??? IR EXCHANGE NEPHRO PERC RT SI  10/23/2017   ??? IR NEPHROURETERAL PERC RT PLC CATH NEW ACCESS  SI  03/29/2019        Review of Systems:    Pertinent items are noted in the History of Present Illness.    Prior to Admission medications    Medication Sig Start Date End Date Taking? Authorizing Provider   traZODone (DESYREL) 50 mg tablet Take 1 Tab by mouth nightly. 04/06/19  Yes Crenshaw, Woodson E, DO   simvastatin (ZOCOR) 20 mg tablet TAKE 1 TABLET BY MOUTH EVERY DAY AT NIGHT 01/05/19  Yes Rae Roam F, NP   PARoxetine (PAXIL) 20 mg tablet Take 1 Tab by mouth daily. 04/20/18  Yes Rae Roam F, NP   food supplemt, lactose-reduced (ENSURE ACTIVE CLEAR) liqd Take 237 mL by mouth four (4) times daily. 04/01/18  Yes Geri Seminole  C, MD   calcium carb-vitamin D3-vit K2 600 mg-1,000 unit-90 mcg tab Take  by mouth.   Yes Provider, Historical   ascorbic acid, vitamin C, (VITAMIN C) 1,000 mg tablet Take  by mouth.   Yes Provider, Historical   ferrous sulfate (IRON) 325 mg (65 mg iron) tablet Take  by mouth Daily (before breakfast).   Yes Provider, Historical        No Known Allergies    Family History   Problem Relation Age of Onset   ??? Stroke Mother    ??? Heart Disease Mother    ??? Cancer Father    ??? Asthma Father    ??? Lung Disease Father         COPD/smoker   ??? Cancer Brother    ??? Heart Disease Brother    ??? Alcohol abuse Brother      Social History     Tobacco Use   ??? Smoking status: Former Smoker     Packs/day: 0.50     Years: 20.00     Pack years: 10.00   ??? Smokeless tobacco: Never Used   ??? Tobacco comment: stopped at age 38   Substance Use Topics   ??? Alcohol use: No        Objective:       Physical Examination:    Vitals:    08/08/19 0805   BP: 135/72   Pulse: 80   Resp: 16   Temp: 97.8 ??F (36.6 ??C)   SpO2: 97%   Weight: 49.9 kg (110 lb)   Height: 5\' 2"  (1.575 m)       Pain Assessment  Pain Intensity 1: 0 (08/08/19 0805)         Patient Stated Pain Goal: 0      HEART: regular rate and rhythm  LUNG: clear to auscultation bilaterally  ABDOMEN: normal findings: soft, non-tender. Clear urine in ostomy. Right back dressing and drain dry and intact.  EXTREMITIES: warm, no edema    Laboratory:     Lab Results   Component Value Date/Time    Sodium 143 05/16/2019 05:20 AM    Sodium 142 05/15/2019 06:01 AM    Potassium 3.1 (L) 05/16/2019 05:20 AM    Potassium 3.6 05/15/2019 06:01 AM    Chloride 110 (H) 05/16/2019 05:20 AM    Chloride 109 (H) 05/15/2019 06:01 AM    CO2 28 05/16/2019 05:20 AM    CO2 27 05/15/2019 06:01 AM    Anion gap 5 (L) 05/16/2019 05:20 AM    Anion gap 6 (L) 05/15/2019 06:01 AM    Glucose 81 05/16/2019 05:20 AM    Glucose 79 05/15/2019 06:01 AM    BUN 12 05/16/2019 05:20 AM    BUN 13 05/15/2019 06:01 AM    Creatinine 0.55 (L) 05/16/2019 05:20 AM    Creatinine 0.54 (L) 05/15/2019 06:01 AM    GFR est AA >60 05/16/2019 05:20 AM    GFR est AA >60 05/15/2019 06:01 AM    GFR est non-AA >60 05/16/2019 05:20 AM    GFR est non-AA >60 05/15/2019 06:01 AM    Calcium 8.1 (L) 05/16/2019 05:20 AM    Calcium 8.5 05/15/2019 06:01 AM    Magnesium 1.8 04/03/2019 05:16 AM    Magnesium 1.8 10/20/2012 05:38 AM    Albumin 2.7 (L) 05/09/2019 05:41 PM    Albumin 1.8 (L) 04/07/2019 05:03 PM    Protein, total 7.2 05/09/2019 05:41 PM    Protein, total 6.3 04/07/2019  05:03 PM    Globulin 4.5 (H) 05/09/2019 05:41 PM    Globulin 4.5 (H) 04/07/2019 05:03 PM    A-G Ratio 0.6 (L) 05/09/2019 05:41 PM    A-G Ratio 0.4 (L) 04/07/2019 05:03 PM    ALT (SGPT) 17 05/09/2019 05:41 PM    ALT (SGPT) 16 04/07/2019 05:03 PM     Lab Results   Component Value Date/Time    WBC 5.3 05/16/2019 05:20 AM    WBC 5.3 05/15/2019 06:01 AM    HGB 9.4 (L) 05/16/2019 05:20 AM    HGB 10.0 (L) 05/15/2019 06:01 AM    HCT 30.8 (L) 05/16/2019 05:20 AM    HCT 31.3 (L) 05/15/2019 06:01 AM    PLATELET 262 05/16/2019 05:20 AM    PLATELET 223 05/15/2019 06:01 AM     Lab Results    Component Value Date/Time    aPTT 25.7 10/11/2012 03:40 PM    Prothrombin time 10.6 10/11/2012 03:40 PM    INR 1.0 10/11/2012 03:40 PM       Assessment:     Chronic right ureteral obstruction    Hospital Problems  Date Reviewed: 06/01/2018    None          Plan:     Planned Procedure:  Nephrostogram, drain exchange    Risks, benefits, and alternatives reviewed with patient and she agrees to proceed with the procedure.      Signed By: Oval Linsey, PA-C     August 08, 2019

## 2019-08-08 NOTE — Progress Notes (Signed)
[facility] called to transport patient back to [facility].

## 2019-09-05 ENCOUNTER — Ambulatory Visit (HOSPITAL_COMMUNITY)
Admission: RE | Admit: 2019-09-05 | Discharge: 2019-09-05 | Disposition: A | Discharge status: Home / Self Care | Payer: Medicare PPO | Source: Ambulatory Visit | Attending: Family Medicine | Admitting: Family Medicine

## 2019-09-05 ENCOUNTER — Other Ambulatory Visit: Payer: Self-pay

## 2019-09-05 DIAGNOSIS — Z1231 Encounter for screening mammogram for malignant neoplasm of breast: Secondary | ICD-10-CM | POA: Diagnosis not present

## 2019-09-06 ENCOUNTER — Other Ambulatory Visit: Payer: Self-pay | Admitting: *Deleted

## 2019-09-06 MED ORDER — HYDROCHLOROTHIAZIDE 25 MG PO TABS: 12.5000 mg | ORAL_TABLET | Freq: Every day | ORAL | 0 refills | Status: DC

## 2019-09-07 ENCOUNTER — Other Ambulatory Visit: Payer: Self-pay | Admitting: Family Medicine

## 2019-09-30 ENCOUNTER — Ambulatory Visit (INDEPENDENT_AMBULATORY_CARE_PROVIDER_SITE_OTHER): Payer: Medicare PPO | Admitting: Family Medicine

## 2019-09-30 ENCOUNTER — Encounter: Payer: Self-pay | Admitting: Family Medicine

## 2019-09-30 ENCOUNTER — Other Ambulatory Visit: Payer: Self-pay

## 2019-09-30 VITALS — BP 124/84 | HR 53 | Temp 97.7°F | Resp 16 | Ht 67.0 in | Wt 187.0 lb

## 2019-09-30 DIAGNOSIS — E7800 Pure hypercholesterolemia, unspecified: Secondary | ICD-10-CM | POA: Diagnosis not present

## 2019-09-30 DIAGNOSIS — H409 Unspecified glaucoma: Secondary | ICD-10-CM | POA: Diagnosis not present

## 2019-09-30 DIAGNOSIS — I10 Essential (primary) hypertension: Secondary | ICD-10-CM | POA: Diagnosis not present

## 2019-09-30 DIAGNOSIS — Z0000 Encounter for general adult medical examination without abnormal findings: Secondary | ICD-10-CM

## 2019-09-30 DIAGNOSIS — Z9889 Other specified postprocedural states: Secondary | ICD-10-CM | POA: Diagnosis not present

## 2019-09-30 DIAGNOSIS — R4189 Other symptoms and signs involving cognitive functions and awareness: Secondary | ICD-10-CM

## 2019-09-30 DIAGNOSIS — Z8673 Personal history of transient ischemic attack (TIA), and cerebral infarction without residual deficits: Secondary | ICD-10-CM

## 2019-09-30 NOTE — Progress Notes (Signed)
Subjective:   Patient presents for Medicare Annual/Subsequent preventive examination.   Pt feels like she had a stroke back in the fall. She went to meet a friend to get some vegetables in the parking lot at the [facility]. She was talking to someone that worked at [employer] and then she rememberes the police coming to ask her some questions.  ( she did not have clothing on her lower part or shoes)She doesn't recall what happened and she remembers waking up at [facility]. She staates they did not do a brain scan, so she ended up going to [facility], MRI was done which was which did not show a new stroke   HTN- taking bp as prescribed, no concerns   Hyperlipidemia- taking lipitor at bedtime    Cognitive decline- taking aricept    s/p craniotomoy- she takes keppra for seizure prophylaxis    Review Past Medical/Family/Social: per EMR  Risk Factors  Current exercise habits: walks  Dietary issues discussed: Yes  Cardiac risk factors: Stroke, HTN, Hyperliopidemia   Depression Screen  (Note: if answer to either of the following is "Yes", a more complete depression screening is indicated)  Over the past two weeks, have you felt down, depressed or hopeless? No Over the past two weeks, have you felt little interest or pleasure in doing things? No Have you lost interest or pleasure in daily life? No Do you often feel hopeless? No Do you cry easily over simple problems? No   Activities of Daily Living  In your present state of health, do you have any difficulty performing the following activities?:  Driving? No  Managing money? No  Feeding yourself? No  Getting from bed to chair? No  Climbing a flight of stairs? No  Preparing food and eating?: No  Bathing or showering? No  Getting dressed: No  Getting to the toilet? No  Using the toilet:No  Moving around from place to place: No  In the past year have you fallen or had a near fall?:No  Are you sexually active? No  Do you have more  than one partner? No   Hearing Difficulties: No  Do you often ask people to speak up or repeat themselves? No  Do you experience ringing or noises in your ears? No Do you have difficulty understanding soft or whispered voices? No  Do you feel that you have a problem with memory? No Do you often misplace items? No  Do you feel safe at home? Yes  Cognitive Testing  Alert? Yes Normal Appearance?Yes  Oriented to person? Yes Place? Yes  Time? Yes  Recall of three objects? Yes  Can perform simple calculations? Yes  Displays appropriate judgment?Yes  Can read the correct time from a watch face?Yes   List the Names of Other Physician/Practitioners you currently use: Ophthalmology - [facility] , [facility], podiatry    Screening Tests / Date ColonoscopyUTD ZostavaxDeclined MammogramUTD 2021 Influenza VaccineDecline  Tetanus/tdapUTD Pneumonia- UTD COVID shot yesterday-  ROS: GEN- denies fatigue, fever, weight loss,weakness, recent illness HEENT- denies eye drainage, change in vision, nasal discharge, CVS- denies chest pain, palpitations RESP- denies SOB, cough, wheeze ABD- denies N/V, change in stools, abd pain GU- denies dysuria, hematuria, dribbling, incontinence MSK- denies joint pain, muscle aches, injury Neuro- denies headache, dizziness, syncope, seizure activity  PHYSICAL: GEN- NAD, alert and oriented x3,vitals reviewed  HEENT- PERRL, EOMI, non injected sclera, pink conjunctiva, MMM, oropharynx clear,  old craniomtomy scar in tact no significant swelling around, has  mild drooping eyelids  Neck- Supple, no thryomegaly, no JVD CVS- RRR, no murmur RESP-CTAB ABD-NABS,soft,NT,ND EXT-pedaledema Pulses- Radial, DP- 2+  Assessment:    Annual wellness medicare exam   Plan:    During the course of the visit the patient was educated and counseled about appropriate screening and preventive services including:  Audit  C/Fall/Depression - negative  S/P craniotomy- taking keppra    HTN- well controlled     Glaucoma- per eye doctor , note she does not want surgical intervention for her drooping eyelids     Hyperluiouidemua - taking statin drug  Recheck lipids and LFT today   History of stroke with the altered mental episode occurring last fall.  Questionable if she did have another stroke or possible seizure as she does have history of craniotomy.  At this point I think the only thing we can do is just monitor to see if there are any further changes or episodes of altered mental status.  There was no infection found at that time.  She looks at her baseline today.   Since she received her Covid vaccine yesterday I recommend she hold off on getting the flu shot today   Full Code, but does not want prolonged life saving measures on ventilator   Diet review for nutrition referral? Yes ____ Not Indicated __x__  Patient Instructions (the written plan) was given to the patient.  Medicare Attestation  I have personally reviewed:  The patient's medical and social history  Their use of alcohol, tobacco or illicit drugs  Their current medications and supplements  The patient's functional ability including ADLs,fall risks, home safety risks, cognitive, and hearing and visual impairment  Diet and physical activities  Evidence for depression or mood disorders  The patient's weight, height, BMI, and visual acuity have been recorded in the chart. I have made referrals, counseling, and provided education to the patient based on review of the above and I have provided the patient with a written personalized care plan for preventive services.

## 2019-09-30 NOTE — Patient Instructions (Signed)
F/U 6 months

## 2019-10-01 LAB — CBC WITH DIFFERENTIAL/PLATELET
Absolute Monocytes: 650 cells/uL (ref 200–950)
Basophils Absolute: 63 cells/uL (ref 0–200)
Basophils Relative: 1.1 %
Eosinophils Absolute: 80 cells/uL (ref 15–500)
Eosinophils Relative: 1.4 %
HCT: 44.3 % (ref 35.0–45.0)
Hemoglobin: 15 g/dL (ref 11.7–15.5)
Lymphs Abs: 1619 cells/uL (ref 850–3900)
MCH: 30.9 pg (ref 27.0–33.0)
MCHC: 33.9 g/dL (ref 32.0–36.0)
MCV: 91.2 fL (ref 80.0–100.0)
MPV: 11.2 fL (ref 7.5–12.5)
Monocytes Relative: 11.4 %
Neutro Abs: 3289 cells/uL (ref 1500–7800)
Neutrophils Relative %: 57.7 %
Platelets: 153 10*3/uL (ref 140–400)
RBC: 4.86 10*6/uL (ref 3.80–5.10)
RDW: 13.5 % (ref 11.0–15.0)
Total Lymphocyte: 28.4 %
WBC: 5.7 10*3/uL (ref 3.8–10.8)

## 2019-10-01 LAB — LIPID PANEL
Cholesterol: 203 mg/dL — ABNORMAL HIGH (ref <200)
HDL: 75 mg/dL (ref >=50)
LDL Cholesterol (Calc): 105 mg/dL (calc) — ABNORMAL HIGH
Non-HDL Cholesterol (Calc): 128 mg/dL (calc) (ref <130)
Total CHOL/HDL Ratio: 2.7 (calc) (ref <5.0)
Triglycerides: 133 mg/dL (ref <150)

## 2019-10-01 LAB — COMPREHENSIVE METABOLIC PANEL
AG Ratio: 1.7 (calc) (ref 1.0–2.5)
ALT: 21 U/L (ref 6–29)
AST: 17 U/L (ref 10–35)
Albumin: 4.7 g/dL (ref 3.6–5.1)
Alkaline phosphatase (APISO): 75 U/L (ref 37–153)
BUN: 12 mg/dL (ref 7–25)
CO2: 29 mmol/L (ref 20–32)
Calcium: 10.2 mg/dL (ref 8.6–10.4)
Chloride: 99 mmol/L (ref 98–110)
Creat: 0.76 mg/dL (ref 0.60–0.93)
Globulin: 2.8 g/dL (calc) (ref 1.9–3.7)
Glucose, Bld: 73 mg/dL (ref 65–99)
Potassium: 3.9 mmol/L (ref 3.5–5.3)
Sodium: 140 mmol/L (ref 135–146)
Total Bilirubin: 0.6 mg/dL (ref 0.2–1.2)
Total Protein: 7.5 g/dL (ref 6.1–8.1)

## 2019-10-04 ENCOUNTER — Encounter: Payer: Self-pay | Admitting: *Deleted

## 2019-10-21 ENCOUNTER — Encounter (INDEPENDENT_AMBULATORY_CARE_PROVIDER_SITE_OTHER): Payer: Self-pay | Admitting: *Deleted

## 2019-10-22 ENCOUNTER — Other Ambulatory Visit: Payer: Self-pay | Admitting: Family Medicine

## 2019-11-03 ENCOUNTER — Other Ambulatory Visit: Payer: Self-pay | Admitting: Family Medicine

## 2019-11-07 ENCOUNTER — Encounter

## 2019-11-07 ENCOUNTER — Inpatient Hospital Stay: Admit: 2019-11-07 | Payer: MEDICARE | Attending: Diagnostic Radiology | Primary: Community Health

## 2019-11-07 DIAGNOSIS — Z4803 Encounter for change or removal of drains: Secondary | ICD-10-CM

## 2019-11-07 MED ORDER — IOPAMIDOL 61 % IV SOLN
61 % | Freq: Once | INTRAVENOUS | Status: AC
Start: 2019-11-07 — End: 2019-11-07
  Administered 2019-11-07: 15:00:00

## 2019-11-07 MED ORDER — LIDOCAINE HCL 2 % (20 MG/ML) IJ SOLN
20 mg/mL (2 %) | INTRAMUSCULAR | Status: AC
Start: 2019-11-07 — End: ?

## 2019-11-07 MED ORDER — LIDOCAINE HCL 2 % (20 MG/ML) IJ SOLN
20 mg/mL (2 %) | INTRAMUSCULAR | Status: DC | PRN
Start: 2019-11-07 — End: 2019-11-11
  Administered 2019-11-07: 14:00:00 via INTRADERMAL

## 2019-11-07 MED ORDER — OXYCODONE 5 MG TAB
5 mg | Freq: Once | ORAL | Status: AC
Start: 2019-11-07 — End: 2019-11-07
  Administered 2019-11-07: 14:00:00 via ORAL

## 2019-11-07 MED ORDER — OXYCODONE 5 MG TAB
5 mg | ORAL | Status: AC
Start: 2019-11-07 — End: ?

## 2019-11-07 MED ORDER — DIAZEPAM 5 MG TAB
5 mg | ORAL | Status: AC
Start: 2019-11-07 — End: ?

## 2019-11-07 MED ORDER — DIAZEPAM 5 MG TAB
5 mg | Freq: Once | ORAL | Status: AC
Start: 2019-11-07 — End: 2019-11-07
  Administered 2019-11-07: 14:00:00 via ORAL

## 2019-11-07 MED FILL — XYLOCAINE 20 MG/ML (2 %) INJECTION SOLUTION: 20 mg/mL (2 %) | INTRAMUSCULAR | Qty: 20

## 2019-11-07 MED FILL — OXYCODONE 5 MG TAB: 5 mg | ORAL | Qty: 2

## 2019-11-07 MED FILL — DIAZEPAM 5 MG TAB: 5 mg | ORAL | Qty: 2

## 2019-11-07 NOTE — Progress Notes (Signed)
IR Nurse Pre-Procedure Checklist Part 2          Consent signed: Yes    H&P complete:  Not applicable    Antibiotics: Not applicable    Airway/Mallampati Done: Not applicable    Shaved: Not applicable    Pregnancy Form:Not applicable    Patient Position: Yes    MD Side: Yes     Biopsy Worksheet: Not applicable    Specimen Medium: Not applicable    Pt to IR suite 2 via stretcher with RN

## 2019-11-07 NOTE — Progress Notes (Signed)
TRANSFER - OUT REPORT:    Verbal report given to Evelena Peat, RN on Ritta Dogan  being transferred to IR room 1 for routine post - op       Report consisted of patient???s Situation, Background, Assessment and   Recommendations(SBAR).     Information from the following report(s) SBAR, Procedure Summary and MAR was reviewed with the receiving nurse.    Opportunity for questions and clarification was provided.     Conscious Sedation:   0 Mcg of Fentanyl administered  0 Mg of Versed administered    Pt tolerated procedure well.     Visit Vitals  BP (!) 84/59   Pulse 76   Temp 97.8 ??F (36.6 ??C)   Resp 16   SpO2 99%   Breastfeeding No     Past Medical History:   Diagnosis Date   ??? Abdominal pain, unspecified site 10/27/2012   ??? Arthritis     generalized   ??? Asthma 10/27/2012   ??? Bladder cancer ([place])    ??? Depression     managed with medication    ??? Dysuria 10/27/2012   ??? Environmental allergies     managed with medication    ??? GERD (gastroesophageal reflux disease) 10/27/2012   ??? History of kidney stones     states surgical removed   ??? Hypothyroidism     managed with medication    ??? Other "heavy-for-dates" infants     ureterectomy and ileal conduit   ??? Unspecified disorder of bladder 10/27/2012   ??? Urethral caruncle 10/27/2012   ??? Urinary frequency 10/27/2012   ??? Urinary tract infection, site not specified 10/27/2012

## 2019-11-07 NOTE — Progress Notes (Signed)
Prep complete. Patient ready for procedure.

## 2019-11-07 NOTE — Progress Notes (Signed)
Recovery period without difficulty. Pt alert and oriented and denies pain. Dressing is clean, dry, and intact. Reviewed discharge instructions with patient and daughter, both verbalized understanding. Pt escorted to lobby discharge area via wheelchair.

## 2019-11-07 NOTE — Procedures (Signed)
Department of Interventional Radiology  [phone]        Interventional Radiology Brief Procedure Note    Patient: Cathy Jackson MRN: [id number]  SSN: [id number]    Date of Birth: 1946-03-28  Age: 74 y.o.  Sex: female      Date of Procedure: 11/07/2019    Pre-Procedure Diagnosis: Ureteral obstruction    Post-Procedure Diagnosis: SAME    Procedure(s): Percutaneous Nephrostomy Tube Exchange    Brief Description of Procedure: NUS exchange    Performed By: Lorelee Cover, MD     Assistants: None    Anesthesia:Lidocaine    Estimated Blood Loss: Less than 59ml    Specimens:  None    Implants:  Nephro-Ureteral Drain    Findings: right NUS exchange    Complications: None    Recommendations: capped as before     Follow Up: 8 weeks    Signed By: Lorelee Cover, MD     November 07, 2019

## 2019-12-01 ENCOUNTER — Telehealth: Payer: Self-pay | Admitting: Family Medicine

## 2019-12-01 NOTE — Telephone Encounter (Signed)
Patient would like get chantix called in if insurance covers

## 2019-12-01 NOTE — Telephone Encounter (Signed)
Ok to order starter pack?

## 2019-12-02 MED ORDER — CHANTIX STARTING MONTH PAK 0.5 MG X 11 & 1 MG X 42 PO TABS: ORAL_TABLET | ORAL | 0 refills | Status: DC

## 2019-12-02 NOTE — Telephone Encounter (Signed)
Prescription sent to pharmacy.

## 2019-12-02 NOTE — Telephone Encounter (Signed)
Okay to send 

## 2019-12-05 ENCOUNTER — Other Ambulatory Visit: Payer: Self-pay | Admitting: Family Medicine

## 2019-12-15 ENCOUNTER — Other Ambulatory Visit: Payer: Self-pay | Admitting: Family Medicine

## 2019-12-29 DIAGNOSIS — H2513 Age-related nuclear cataract, bilateral: Secondary | ICD-10-CM | POA: Diagnosis not present

## 2020-01-03 ENCOUNTER — Encounter

## 2020-01-03 ENCOUNTER — Inpatient Hospital Stay: Admit: 2020-01-03 | Payer: MEDICARE | Attending: Diagnostic Radiology | Primary: Community Health

## 2020-01-03 DIAGNOSIS — N131 Hydronephrosis with ureteral stricture, not elsewhere classified: Secondary | ICD-10-CM

## 2020-01-03 MED ORDER — OXYCODONE 5 MG TAB
5 mg | Freq: Once | ORAL | Status: AC
Start: 2020-01-03 — End: 2020-01-03
  Administered 2020-01-03: 14:00:00 via ORAL

## 2020-01-03 MED ORDER — LIDOCAINE HCL 2 % (20 MG/ML) IJ SOLN
20 mg/mL (2 %) | INTRAMUSCULAR | Status: DC | PRN
Start: 2020-01-03 — End: 2020-01-07
  Administered 2020-01-03: 14:00:00 via INTRADERMAL

## 2020-01-03 MED ORDER — LIDOCAINE HCL 2 % (20 MG/ML) IJ SOLN
20 mg/mL (2 %) | INTRAMUSCULAR | Status: AC
Start: 2020-01-03 — End: ?

## 2020-01-03 MED ORDER — OXYCODONE 5 MG TAB
5 mg | ORAL | Status: AC
Start: 2020-01-03 — End: ?

## 2020-01-03 MED ORDER — IOPAMIDOL 61 % IV SOLN
61 % | Freq: Once | INTRAVENOUS | Status: AC
Start: 2020-01-03 — End: 2020-01-03
  Administered 2020-01-03: 15:00:00

## 2020-01-03 MED ORDER — DIAZEPAM 5 MG TAB
5 mg | Freq: Once | ORAL | Status: AC
Start: 2020-01-03 — End: 2020-01-03
  Administered 2020-01-03: 14:00:00 via ORAL

## 2020-01-03 MED ORDER — DIAZEPAM 5 MG TAB
5 mg | ORAL | Status: AC
Start: 2020-01-03 — End: ?

## 2020-01-03 MED ORDER — DIAZEPAM 5 MG TAB
5 mg | Freq: Once | ORAL | Status: DC
Start: 2020-01-03 — End: 2020-01-03

## 2020-01-03 MED FILL — DIAZEPAM 5 MG TAB: 5 mg | ORAL | Qty: 1

## 2020-01-03 MED FILL — OXYCODONE 5 MG TAB: 5 mg | ORAL | Qty: 1

## 2020-01-03 MED FILL — XYLOCAINE 20 MG/ML (2 %) INJECTION SOLUTION: 20 mg/mL (2 %) | INTRAMUSCULAR | Qty: 20

## 2020-01-03 NOTE — Progress Notes (Signed)
Prep complete. Patient ready for procedure.

## 2020-01-03 NOTE — Progress Notes (Signed)
TRANSFER - OUT REPORT:    Verbal report given to Evelena Peat, RN on Cathy Jackson  being transferred to IR Recovery for routine post - op       Report consisted of patient???s Situation, Background, Assessment and   Recommendations(SBAR).     Information from the following report(s) SBAR, Procedure Summary and MAR was reviewed with the receiving nurse.    Opportunity for questions and clarification was provided.     Conscious Sedation:   0 Mcg of Fentanyl administered  0 Mg of Versed administered    Pt tolerated procedure well.     Visit Vitals  BP 131/75   Pulse (!) 118   Temp 97.3 ??F (36.3 ??C)   Resp 16   Ht 5\' 2"  (1.575 m)   Wt 40.8 kg (90 lb)   SpO2 91%   Breastfeeding No   BMI 16.46 kg/m??     Past Medical History:   Diagnosis Date   ??? Abdominal pain, unspecified site 10/27/2012   ??? Arthritis     generalized   ??? Asthma 10/27/2012   ??? Bladder cancer ([place])    ??? Depression     managed with medication    ??? Dysuria 10/27/2012   ??? Environmental allergies     managed with medication    ??? GERD (gastroesophageal reflux disease) 10/27/2012   ??? History of kidney stones     states surgical removed   ??? Hypothyroidism     managed with medication    ??? Other "heavy-for-dates" infants     ureterectomy and ileal conduit   ??? Unspecified disorder of bladder 10/27/2012   ??? Urethral caruncle 10/27/2012   ??? Urinary frequency 10/27/2012   ??? Urinary tract infection, site not specified 10/27/2012                    Nephrostomy Tube 01/03/20 Flank (Active)

## 2020-01-03 NOTE — Progress Notes (Signed)
Pt to IR Suite 2 via stretcher with RN.  IR Nurse Pre-Procedure Checklist Part 2          Consent signed: Yes    H&P complete:  Not applicable    Antibiotics: Not applicable    Airway/Mallampati Done: Not applicable    Shaved: Not applicable    Pregnancy Form:Not applicable    Patient Position: Yes    MD Side: Yes     Biopsy Worksheet: Not applicable    Specimen Medium: Not applicable

## 2020-01-03 NOTE — Procedures (Signed)
Department of Interventional Radiology  [phone]        Interventional Radiology Brief Procedure Note    Patient: Cathy Jackson MRN: [id number]  SSN: [id number]    Date of Birth: 1946-03-07  Age: 74 y.o.  Sex: female      Date of Procedure: 01/03/2020    Pre-Procedure Diagnosis: Ureteral obstruction    Post-Procedure Diagnosis: SAME    Procedure(s): Percutaneous Nephrostomy Tube Exchange    Brief Description of Procedure: as above and retrograde ureteral stent    Performed By: Lorelee Cover, MD     Assistants: None    Anesthesia:Lidocaine    Estimated Blood Loss: Less than 66ml    Specimens:  None    Implants:  Ureteral Stent    Findings: severely encrusted NUS.  REmoved after some difficulty.  Retrograde 10 F pigtal placed through conduit.  28F Neph left in place    Complications: None    Recommendations: external drainage overnight. Clamp in am     Follow Up: 1 week    Signed By: Lorelee Cover, MD     Jan 03, 2020

## 2020-01-10 ENCOUNTER — Inpatient Hospital Stay
Admit: 2020-01-10 | Discharge: 2020-01-16 | Disposition: A | Discharge status: Hospice | Payer: MEDICARE | Attending: Internal Medicine | Admitting: Internal Medicine

## 2020-01-10 ENCOUNTER — Inpatient Hospital Stay: Payer: MEDICARE | Attending: Diagnostic Radiology | Primary: Community Health

## 2020-01-10 ENCOUNTER — Emergency Department: Admit: 2020-01-10 | Payer: MEDICARE | Primary: Community Health

## 2020-01-10 DIAGNOSIS — A419 Sepsis, unspecified organism: Principal | ICD-10-CM

## 2020-01-10 DIAGNOSIS — C679 Malignant neoplasm of bladder, unspecified: Secondary | ICD-10-CM

## 2020-01-10 LAB — CBC WITH AUTOMATED DIFF
ABS. BASOPHILS: 0.1 10*3/uL (ref 0.0–0.2)
ABS. EOSINOPHILS: 0 10*3/uL (ref 0.0–0.8)
ABS. IMM. GRANS.: 1 10*3/uL — ABNORMAL HIGH (ref 0.0–0.5)
ABS. LYMPHOCYTES: 1.6 10*3/uL (ref 0.5–4.6)
ABS. MONOCYTES: 2.6 10*3/uL — ABNORMAL HIGH (ref 0.1–1.3)
ABS. NEUTROPHILS: 32.9 10*3/uL — ABNORMAL HIGH (ref 1.7–8.2)
ABSOLUTE NRBC: 0.02 10*3/uL (ref 0.0–0.2)
BASOPHILS: 0 % (ref 0.0–2.0)
EOSINOPHILS: 0 % — ABNORMAL LOW (ref 0.5–7.8)
HCT: 54.9 % — ABNORMAL HIGH (ref 35.8–46.3)
HGB: 18 g/dL — ABNORMAL HIGH (ref 11.7–15.4)
IMMATURE GRANULOCYTES: 3 % (ref 0.0–5.0)
LYMPHOCYTES: 4 % — ABNORMAL LOW (ref 13–44)
MCH: 30.7 PG (ref 26.1–32.9)
MCHC: 32.8 g/dL (ref 31.4–35.0)
MCV: 93.5 FL (ref 79.6–97.8)
MONOCYTES: 7 % (ref 4.0–12.0)
MPV: 10.1 FL (ref 9.4–12.3)
NEUTROPHILS: 86 % — ABNORMAL HIGH (ref 43–78)
PLATELET: 379 10*3/uL (ref 150–450)
RBC: 5.87 M/uL — ABNORMAL HIGH (ref 4.05–5.2)
RDW: 15.4 % — ABNORMAL HIGH (ref 11.9–14.6)
WBC: 38.3 10*3/uL — ABNORMAL HIGH (ref 4.3–11.1)

## 2020-01-10 LAB — URINE MICROSCOPIC
Casts: 0 /lpf
Crystals, urine: 0 /LPF
Mucus: 0 /lpf
WBC: >100 /hpf

## 2020-01-10 LAB — METABOLIC PANEL, COMPREHENSIVE
A-G Ratio: 0.7 — ABNORMAL LOW (ref 1.2–3.5)
ALT (SGPT): 21 U/L (ref 12–65)
AST (SGOT): 18 U/L (ref 15–37)
Albumin: 3.5 g/dL (ref 3.2–4.6)
Alk. phosphatase: 99 U/L (ref 50–136)
Anion gap: 10 mmol/L (ref 7–16)
BUN: 96 MG/DL — ABNORMAL HIGH (ref 8–23)
Bilirubin, total: 0.5 MG/DL (ref 0.2–1.1)
CO2: 22 mmol/L (ref 21–32)
Calcium: 10.1 MG/DL (ref 8.3–10.4)
Chloride: 106 mmol/L (ref 98–107)
Creatinine: 1.92 MG/DL — ABNORMAL HIGH (ref 0.6–1.0)
GFR est AA: 33 mL/min/{1.73_m2} — ABNORMAL LOW (ref >60)
GFR est non-AA: 27 mL/min/{1.73_m2} — ABNORMAL LOW (ref >60)
Globulin: 5.3 g/dL — ABNORMAL HIGH (ref 2.3–3.5)
Glucose: 170 mg/dL — ABNORMAL HIGH (ref 65–100)
Potassium: 5.6 mmol/L — ABNORMAL HIGH (ref 3.5–5.1)
Protein, total: 8.8 g/dL — ABNORMAL HIGH (ref 6.3–8.2)
Sodium: 138 mmol/L (ref 136–145)

## 2020-01-10 LAB — MAGNESIUM: Magnesium: 2.3 mg/dL (ref 1.8–2.4)

## 2020-01-10 LAB — LACTIC ACID
Lactic acid: 3.6 MMOL/L — CR (ref 0.4–2.0)
Lactic acid: 2.6 MMOL/L — ABNORMAL HIGH (ref 0.4–2.0)
Lactic acid: 2.3 MMOL/L — ABNORMAL HIGH (ref 0.4–2.0)

## 2020-01-10 MED ORDER — SODIUM CHLORIDE 0.9 % IV PIGGY BACK
500 mg | Freq: Two times a day (BID) | INTRAVENOUS | Status: DC
Start: 2020-01-10 — End: 2020-01-13
  Administered 2020-01-11 – 2020-01-13 (×6): via INTRAVENOUS

## 2020-01-10 MED ORDER — ACETAMINOPHEN 650 MG RECTAL SUPPOSITORY
650 mg | Freq: Four times a day (QID) | RECTAL | Status: DC | PRN
Start: 2020-01-10 — End: 2020-01-16

## 2020-01-10 MED ORDER — ACETAMINOPHEN 325 MG TABLET
325 mg | Freq: Four times a day (QID) | ORAL | Status: DC | PRN
Start: 2020-01-10 — End: 2020-01-16
  Administered 2020-01-11 – 2020-01-13 (×3): via ORAL

## 2020-01-10 MED ORDER — MEROPENEM 500 MG IV SOLR
500 mg | Freq: Once | INTRAVENOUS | Status: AC
Start: 2020-01-10 — End: 2020-01-10
  Administered 2020-01-10: 15:00:00 via INTRAVENOUS

## 2020-01-10 MED ORDER — GENTAMICIN 40 MG/ML IJ SOLN
40 mg/mL | INTRAMUSCULAR | Status: AC
Start: 2020-01-10 — End: 2020-01-10
  Administered 2020-01-10: 14:00:00 via INTRAVENOUS

## 2020-01-10 MED ORDER — SODIUM CHLORIDE 0.9 % IJ SYRG
Freq: Three times a day (TID) | INTRAMUSCULAR | Status: DC
Start: 2020-01-10 — End: 2020-01-16
  Administered 2020-01-10 – 2020-01-15 (×14): via INTRAVENOUS

## 2020-01-10 MED ORDER — ACETAMINOPHEN 650 MG RECTAL SUPPOSITORY
650 mg | RECTAL | Status: AC
Start: 2020-01-10 — End: 2020-01-10
  Administered 2020-01-10: 14:00:00 via RECTAL

## 2020-01-10 MED ORDER — ATORVASTATIN 20 MG TAB
20 mg | Freq: Every evening | ORAL | Status: DC
Start: 2020-01-10 — End: 2020-01-16
  Administered 2020-01-11 – 2020-01-16 (×6): via ORAL

## 2020-01-10 MED ORDER — LACTATED RINGERS IV
INTRAVENOUS | Status: DC
Start: 2020-01-10 — End: 2020-01-12
  Administered 2020-01-10 – 2020-01-12 (×5): via INTRAVENOUS

## 2020-01-10 MED ORDER — SODIUM CHLORIDE 0.9 % IV PIGGY BACK
1 gram | Freq: Three times a day (TID) | INTRAVENOUS | Status: DC
Start: 2020-01-10 — End: 2020-01-10
  Administered 2020-01-10: 16:00:00 via INTRAVENOUS

## 2020-01-10 MED ORDER — PAROXETINE 20 MG TAB
20 mg | Freq: Every day | ORAL | Status: DC
Start: 2020-01-10 — End: 2020-01-16
  Administered 2020-01-11 – 2020-01-16 (×6): via ORAL

## 2020-01-10 MED ORDER — PROMETHAZINE 25 MG TAB
25 mg | Freq: Four times a day (QID) | ORAL | Status: DC | PRN
Start: 2020-01-10 — End: 2020-01-16

## 2020-01-10 MED ORDER — POLYETHYLENE GLYCOL 3350 17 GRAM (100 %) ORAL POWDER PACKET
17 gram | Freq: Every day | ORAL | Status: DC | PRN
Start: 2020-01-10 — End: 2020-01-16

## 2020-01-10 MED ORDER — HEPARIN (PORCINE) 5,000 UNIT/ML IJ SOLN
5000 unit/mL | Freq: Three times a day (TID) | INTRAMUSCULAR | Status: DC
Start: 2020-01-10 — End: 2020-01-15
  Administered 2020-01-11 – 2020-01-15 (×14): via SUBCUTANEOUS

## 2020-01-10 MED ORDER — SODIUM CHLORIDE 0.9 % IV
INTRAVENOUS | Status: DC
Start: 2020-01-10 — End: 2020-01-14

## 2020-01-10 MED ORDER — SODIUM CHLORIDE 0.9 % IJ SYRG
INTRAMUSCULAR | Status: DC | PRN
Start: 2020-01-10 — End: 2020-01-16

## 2020-01-10 MED ORDER — SODIUM CHLORIDE 0.9 % IV
40 mg/mL | INTRAVENOUS | Status: DC
Start: 2020-01-10 — End: 2020-01-10

## 2020-01-10 MED ORDER — SODIUM CHLORIDE 0.9% BOLUS IV
0.9 % | Freq: Once | INTRAVENOUS | Status: AC
Start: 2020-01-10 — End: 2020-01-10
  Administered 2020-01-10: 14:00:00 via INTRAVENOUS

## 2020-01-10 MED ORDER — ONDANSETRON (PF) 4 MG/2 ML INJECTION
4 mg/2 mL | Freq: Four times a day (QID) | INTRAMUSCULAR | Status: DC | PRN
Start: 2020-01-10 — End: 2020-01-16

## 2020-01-10 MED FILL — LACTATED RINGERS IV: INTRAVENOUS | Qty: 1000

## 2020-01-10 MED FILL — ACETAMINOPHEN 650 MG RECTAL SUPPOSITORY: 650 mg | RECTAL | Qty: 1

## 2020-01-10 MED FILL — MEROPENEM 500 MG IV SOLR: 500 mg | INTRAVENOUS | Qty: 500

## 2020-01-10 MED FILL — GENTAMICIN 40 MG/ML IJ SOLN: 40 mg/mL | INTRAMUSCULAR | Qty: 5

## 2020-01-10 MED FILL — PAROXETINE 20 MG TAB: 20 mg | ORAL | Qty: 1

## 2020-01-10 NOTE — Progress Notes (Signed)
[facility] [place] [facility] Patient observed to be breathing 40 times a minute. Patients pulse 137 and rectal temp 100.8. Genoveva Ill PA notified. [facility] protocols started at this time.

## 2020-01-10 NOTE — ED Notes (Addendum)
Gentamicin complete

## 2020-01-10 NOTE — ED Provider Notes (Signed)
[place] Jean Feldmeier is a 74 y.o. female seen on 01/10/2020 in the [facility] EMERGENCY DEPT in room ER08/08.    HPI and review of systems limited secondary to patient's acuity and chronic conditions    Chief Complaint   Patient presents with   ??? Blood infection     HPI: 74 year old chronically ill female presented to the emergency department from the interventional radiology department.  Patient was there today to have her nephrostomy tube exchanged however patient was found to be hypotensive, febrile, tachycardic and less responsive than normal upon her arrival to the hospital today.  Interventional radiology department initiated sepsis protocol.  Patient was given IV antibiotics, IV fluids and lab work was ordered and sent to the lab.  Patient was then transported to the emergency department for evaluation.  Upon my evaluation, patient is minimally interactive which I was told is patient's mental and physical baseline.  Patient unable to answer questions at this time.    Historian: Previous medical records/preop nurse    REVIEW OF SYSTEMS     Review of Systems   Unable to perform ROS: Mental status change       PAST MEDICAL HISTORY     Past Medical History:   Diagnosis Date   ??? Abdominal pain, unspecified site 10/27/2012   ??? Arthritis     generalized   ??? Asthma 10/27/2012   ??? Bladder cancer ([place])    ??? Depression     managed with medication    ??? Dysuria 10/27/2012   ??? Environmental allergies     managed with medication    ??? GERD (gastroesophageal reflux disease) 10/27/2012   ??? History of kidney stones     states surgical removed   ??? Hypothyroidism     managed with medication    ??? Other "heavy-for-dates" infants     ureterectomy and ileal conduit   ??? Unspecified disorder of bladder 10/27/2012   ??? Urethral caruncle 10/27/2012   ??? Urinary frequency 10/27/2012   ??? Urinary tract infection, site not specified 10/27/2012     Past Surgical History:   Procedure Laterality Date   ??? HX BREAST  BIOPSY Left 2000    x 2   ??? HX CESAREAN SECTION      x 1   ??? HX CHOLECYSTECTOMY     ??? HX NEPHROSTOMY  2014   ??? HX TUBAL LIGATION     ??? HX WRIST FRACTURE TX Left    ??? IR CHANGE EXTRNL NEPHROURETERAL CATH SI  05/30/2019   ??? IR CHANGE EXTRNL NEPHROURETERAL CATH SI  08/08/2019   ??? IR CHANGE INTERNAL URETERAL STENT SI  02/15/2018   ??? IR CHANGE INTERNAL URETERAL STENT SI  04/15/2018   ??? IR CHANGE INTERNAL URETERAL STENT SI  06/14/2018   ??? IR CHANGE URET TUBE VIA ILEAL CONDUIT  09/07/2018   ??? IR CHANGE URET TUBE VIA ILEAL CONDUIT  12/20/2018   ??? IR CHANGE URET TUBE VIA ILEAL CONDUIT  03/21/2019   ??? IR CONVERT NEPHRO PERC RT TO NEPHROURETERAL CATH EXISTING SI  11/12/2017   ??? IR EXCHANGE NEPHRO PERC RT SI  10/23/2017   ??? IR EXCHANGE NEPHRO PERC RT SI  01/03/2020   ??? IR INJ NEPHRO/URETEROGRAM RT EXISTING ACCESS SI  11/07/2019   ??? IR NEPHROURETERAL PERC RT PLC CATH NEW ACCESS  SI  03/29/2019   ??? PR CLOSE CYSTOSTOMY      cystoscopy/ turbt x  2     Social History     Socioeconomic History   ??? Marital status: WIDOWED     Spouse name: Not on file   ??? Number of children: Not on file   ??? Years of education: Not on file   ??? Highest education level: Not on file   Tobacco Use   ??? Smoking status: Former Smoker     Packs/day: 0.50     Years: 20.00     Pack years: 10.00   ??? Smokeless tobacco: Never Used   ??? Tobacco comment: stopped at age 54   Substance and Sexual Activity   ??? Alcohol use: No   ??? Drug use: No   ??? Sexual activity: Never     Prior to Admission Medications   Prescriptions Last Dose Informant Patient Reported? Taking?   PARoxetine (PAXIL) 20 mg tablet   No No   Sig: Take 1 Tab by mouth daily.   ascorbic acid, vitamin C, (VITAMIN C) 1,000 mg tablet   Yes No   Sig: Take  by mouth.   calcium carb-vitamin D3-vit K2 600 mg-1,000 unit-90 mcg tab   Yes No   Sig: Take  by mouth.   ferrous sulfate (IRON) 325 mg (65 mg iron) tablet   Yes No   Sig: Take  by mouth Daily (before breakfast).   food supplemt, lactose-reduced (ENSURE ACTIVE CLEAR) liqd    No No   Sig: Take 237 mL by mouth four (4) times daily.   simvastatin (ZOCOR) 20 mg tablet   No No   Sig: TAKE 1 TABLET BY MOUTH EVERY DAY AT NIGHT   traZODone (DESYREL) 50 mg tablet   No No   Sig: Take 1 Tab by mouth nightly.      Facility-Administered Medications: None     No Known Allergies     PHYSICAL EXAM       Vitals:    01/10/20 1059 01/10/20 1109 01/10/20 1119 01/10/20 1130   BP: (!) 99/58 102/62 104/63    Pulse: (!) 111 (!) 109     Resp: 19 30     Temp:       SpO2: 94% 95% 96% 96%    Vital signs were reviewed.     Physical Exam  Vitals signs and nursing note reviewed.   Constitutional:       Appearance: She is ill-appearing and toxic-appearing.   HENT:      Head: Normocephalic and atraumatic.      Nose: Nose normal.      Mouth/Throat:      Mouth: Mucous membranes are dry.   Eyes:      Extraocular Movements: Extraocular movements intact.   Cardiovascular:      Rate and Rhythm: Regular rhythm. Tachycardia present.      Pulses: Normal pulses.      Heart sounds: Normal heart sounds.   Pulmonary:      Effort: Pulmonary effort is normal.      Breath sounds: Normal breath sounds.   Abdominal:      Palpations: Abdomen is soft.      Tenderness: There is no abdominal tenderness. There is no guarding or rebound.      Comments: Nephrostomy tube   Musculoskeletal: Normal range of motion.   Skin:     General: Skin is warm and dry.   Neurological:      Comments: Does not interact verbally.  Does withdrawal to pain.   Psychiatric:  Comments: Decreased responsiveness therefore unable to assess          MEDICAL DECISION MAKING     ED Course:    Recent Results (from the past 8 hour(s))   CBC WITH AUTOMATED DIFF    Collection Time: 01/10/20  9:20 AM   Result Value Ref Range    WBC 38.3 (H) 4.3 - 11.1 K/uL    RBC 5.87 (H) 4.05 - 5.2 M/uL    HGB 18.0 (H) 11.7 - 15.4 g/dL    HCT 54.9 (H) 35.8 - 46.3 %    MCV 93.5 79.6 - 97.8 FL    MCH 30.7 26.1 - 32.9 PG    MCHC 32.8 31.4 - 35.0 g/dL    RDW 15.4 (H) 11.9 - 14.6 %    PLATELET  379 150 - 450 K/uL    MPV 10.1 9.4 - 12.3 FL    ABSOLUTE NRBC 0.02 0.0 - 0.2 K/uL    DF AUTOMATED      NEUTROPHILS 86 (H) 43 - 78 %    LYMPHOCYTES 4 (L) 13 - 44 %    MONOCYTES 7 4.0 - 12.0 %    EOSINOPHILS 0 (L) 0.5 - 7.8 %    BASOPHILS 0 0.0 - 2.0 %    IMMATURE GRANULOCYTES 3 0.0 - 5.0 %    ABS. NEUTROPHILS 32.9 (H) 1.7 - 8.2 K/UL    ABS. LYMPHOCYTES 1.6 0.5 - 4.6 K/UL    ABS. MONOCYTES 2.6 (H) 0.1 - 1.3 K/UL    ABS. EOSINOPHILS 0.0 0.0 - 0.8 K/UL    ABS. BASOPHILS 0.1 0.0 - 0.2 K/UL    ABS. IMM. GRANS. 1.0 (H) 0.0 - 0.5 K/UL   METABOLIC PANEL, COMPREHENSIVE    Collection Time: 01/10/20  9:20 AM   Result Value Ref Range    Sodium 138 136 - 145 mmol/L    Potassium 5.6 (H) 3.5 - 5.1 mmol/L    Chloride 106 98 - 107 mmol/L    CO2 22 21 - 32 mmol/L    Anion gap 10 7 - 16 mmol/L    Glucose 170 (H) 65 - 100 mg/dL    BUN 96 (H) 8 - 23 MG/DL    Creatinine 1.92 (H) 0.6 - 1.0 MG/DL    GFR est AA 33 (L) >60 ml/min/1.35m    GFR est non-AA 27 (L) >60 ml/min/1.747m   Calcium 10.1 8.3 - 10.4 MG/DL    Bilirubin, total 0.5 0.2 - 1.1 MG/DL    ALT (SGPT) 21 12 - 65 U/L    AST (SGOT) 18 15 - 37 U/L    Alk. phosphatase 99 50 - 136 U/L    Protein, total 8.8 (H) 6.3 - 8.2 g/dL    Albumin 3.5 3.2 - 4.6 g/dL    Globulin 5.3 (H) 2.3 - 3.5 g/dL    A-G Ratio 0.7 (L) 1.2 - 3.5     LACTIC ACID    Collection Time: 01/10/20  9:20 AM   Result Value Ref Range    Lactic acid 3.6 (HH) 0.4 - 2.0 MMOL/L   URINE MICROSCOPIC    Collection Time: 01/10/20  9:20 AM   Result Value Ref Range    WBC >100 0 /hpf    RBC 20-50 0 /hpf    Epithelial cells 0-3 0 /hpf    Bacteria 4+ (H) 0 /hpf    Casts 0 0 /lpf    Crystals, urine 0 0 /LPF    Mucus 0 0 /lpf  Other observations RESULTS VERIFIED MANUALLY       Xr Chest Port    Result Date: 01/10/2020  EXAM: XR CHEST PORT INDICATION: fever COMPARISON: Chest radiograph, 05/09/2019 FINDINGS: The cardiomediastinal silhouette is within normal limits. Atherosclerotic calcifications in the aorta. Upper lobe predominant  emphysema. No superimposed consolidation. No pleural effusion. No pneumothorax. No acute osseous abnormality.     Emphysema without acute superimposed process.       ED Course as of Jan 09 1157   Tue Jan 10, 2020   1025 Patient's previous record and labs from today reviewed as they are resulting.  Patient difficulty with Klebsiella in her urine.  We will add Merrem    [JL]   1138 Sepsis reeval: Patient with obvious signs of severe sepsis with elevated lactic acid and initial signs of hypotension with endorgan damage.  Patient has received 31 cc/kg bolus and appropriate antibiotics.  Patient heart rate improved and blood pressure improved with a MAPMAP now of 77.    [JL]      ED Course User Index  [JL] Kayren Eaves, DO       MDM  Number of Diagnoses or Management Options  Acute renal failure, unspecified acute renal failure type ([place])  Complication of urostomy ([place])  Dehydration  Pyelonephritis  Severe sepsis with acute organ dysfunction [facility])  Diagnosis management comments: 74 year old female presented emergency department from interventional radiology.  Patient is here for a nephrostomy tube exchange.  Patient found to be septic upon her arrival to interventional radiology.  Sepsis bundle initiated by interventional radiology.  Patient antibiotics changed here in the emergency department to cover more broadly and according to sepsis protocol.  Patient's blood pressure improved with IV fluids.  Patient will be admitted to hospital for further evaluation.       Amount and/or Complexity of Data Reviewed  Clinical lab tests: reviewed and ordered  Tests in the radiology section of CPT??: reviewed  Decide to obtain previous medical records or to obtain history from someone other than the patient: yes  Review and summarize past medical records: yes  Discuss the patient with other providers: yes          Disposition:  Admitted  Diagnosis:     ICD-10-CM ICD-9-CM   1. Severe sepsis with acute organ dysfunction (HCC)   A41.9 038.9    R65.20 995.92   2. Acute renal failure, unspecified acute renal failure type ([place])  N17.9 584.9   3. Pyelonephritis  N12 590.80   4. Complication of urostomy ([place])  N99.528 997.5   5. Dehydration  E86.0 276.51     ____________________________________________________________________  A portion of this note was generated using voice recognition dictation software. While the note has been reviewed for accuracy, please note certain words and phrases may not be transcribed as intended and some grammatical and/or typographical errors may be present.

## 2020-01-10 NOTE — Progress Notes (Signed)
Chart review complete, CM met with daughter Cathy Jackson at bedside pt is non verbal, per [facility] pt was under [facility] care at facility and she is trying to reach her siblings to decide if they want to return to [facility] to resume hospice of if she wants pt to be admitted. Pt is total care at facility.  Insurance confirmed  Referral placed for [facility] for facility to follow chart, per [facility] pt can return when needed.    Made daughter aware CM will remain aware to assist with dc needs.    Care Management Interventions  PCP Verified by CM: Yes([facility] Th[facility] Ohm MD)  Discharge Durable Medical Equipment: No  Physical Therapy Consult: No  Occupational Therapy Consult: No  Speech Therapy Consult: No  Current Support Network: Nursing Facility([facility])  Confirm Follow Up Transport: Family  Discharge Location  Discharge Placement: Unable to determine at this time

## 2020-01-10 NOTE — Progress Notes (Signed)
Antibiotics started before second blood culture drawn.  PA aware.

## 2020-01-10 NOTE — Progress Notes (Signed)
Patient arrived to the floor in the company of her daughter.   Patient is AOx0, with eyes open with repositioning. Dual skin check done with Cathy Jackson. Urostomy draining amber, cloudy urine. Nephrostomy with dry dressing intact, lopez valve going to a bili type bag with scan output. Sacral wounds with zinc cream intact.   PIV infusing IVF.   Bed is low and call light within reach.

## 2020-01-10 NOTE — ED Triage Notes (Signed)
Pt comes from IR. Pt is from [facility] and here for neph tube procedure. Upon arrival, temp 100.8 rectal, RR 40, HR 135, BP 112/palp. Pt has foul smelling, cloudy urine. Gentamycin is currently running.

## 2020-01-10 NOTE — H&P (Signed)
HOSPITALIST H&P/CONSULT  NAME:  Cathy Jackson   Age:  74 y.o.  DOB:   07/01/1946   MRN:   [id number]  PCP: Wilfred Lacy, NP  Consulting MD:  Treatment Team: Attending Provider: Debbora Dus, DO; Care Manager: Lidia Collum, RN  HPI:     74 yo CF with past history of dementia, GERD, hypothyroidism, and asthma presents from [facility]. Patient was scheduled for IR follow-up to pull nephrostomy tube but due to vital signs she was sent to the ED. Patient is not interactive and patient's daughter Earnest Bailey, [place]) this is her baseline. She has had prior ESBL Klebsiella urinary infections in the past. Per ED, urine looks "like yogurt." Unable to obtain any further history due to clinical condition. Patient initially given gentamicin and then given Merrem. She was also given IV fluid boluses and Tylenol.     ED course: As above, SCr of 1.92 (baseline around 0.6-0.8). K of 5.6. LA of 3.6. WBC count of 28. CXR unrevealing. Hospitalist consulted for admission.     Complete ROS unable to be completed due to clinical condition.   Past Medical History:   Diagnosis Date   ??? Abdominal pain, unspecified site 10/27/2012   ??? Arthritis     generalized   ??? Asthma 10/27/2012   ??? Bladder cancer ([place])    ??? Depression     managed with medication    ??? Dysuria 10/27/2012   ??? Environmental allergies     managed with medication    ??? GERD (gastroesophageal reflux disease) 10/27/2012   ??? History of kidney stones     states surgical removed   ??? Hypothyroidism     managed with medication    ??? Other "heavy-for-dates" infants     ureterectomy and ileal conduit   ??? Unspecified disorder of bladder 10/27/2012   ??? Urethral caruncle 10/27/2012   ??? Urinary frequency 10/27/2012   ??? Urinary tract infection, site not specified 10/27/2012      Past Surgical History:   Procedure Laterality Date   ??? HX BREAST BIOPSY Left 2000    x 2   ??? HX CESAREAN SECTION      x 1   ??? HX CHOLECYSTECTOMY     ??? HX NEPHROSTOMY  2014   ??? HX TUBAL LIGATION     ??? HX WRIST FRACTURE TX  Left    ??? IR CHANGE EXTRNL NEPHROURETERAL CATH SI  05/30/2019   ??? IR CHANGE EXTRNL NEPHROURETERAL CATH SI  08/08/2019   ??? IR CHANGE INTERNAL URETERAL STENT SI  02/15/2018   ??? IR CHANGE INTERNAL URETERAL STENT SI  04/15/2018   ??? IR CHANGE INTERNAL URETERAL STENT SI  06/14/2018   ??? IR CHANGE URET TUBE VIA ILEAL CONDUIT  09/07/2018   ??? IR CHANGE URET TUBE VIA ILEAL CONDUIT  12/20/2018   ??? IR CHANGE URET TUBE VIA ILEAL CONDUIT  03/21/2019   ??? IR CONVERT NEPHRO PERC RT TO NEPHROURETERAL CATH EXISTING SI  11/12/2017   ??? IR EXCHANGE NEPHRO PERC RT SI  10/23/2017   ??? IR EXCHANGE NEPHRO PERC RT SI  01/03/2020   ??? IR INJ NEPHRO/URETEROGRAM RT EXISTING ACCESS SI  11/07/2019   ??? IR NEPHROURETERAL PERC RT PLC CATH NEW ACCESS  SI  03/29/2019   ??? PR CLOSE CYSTOSTOMY      cystoscopy/ turbt x 2      Prior to Admission Medications   Prescriptions Last Dose Informant Patient Reported? Taking?   PARoxetine (  PAXIL) 20 mg tablet   No No   Sig: Take 1 Tab by mouth daily.   ascorbic acid, vitamin C, (VITAMIN C) 1,000 mg tablet   Yes No   Sig: Take  by mouth.   calcium carb-vitamin D3-vit K2 600 mg-1,000 unit-90 mcg tab   Yes No   Sig: Take  by mouth.   ferrous sulfate (IRON) 325 mg (65 mg iron) tablet   Yes No   Sig: Take  by mouth Daily (before breakfast).   food supplemt, lactose-reduced (ENSURE ACTIVE CLEAR) liqd   No No   Sig: Take 237 mL by mouth four (4) times daily.   simvastatin (ZOCOR) 20 mg tablet   No No   Sig: TAKE 1 TABLET BY MOUTH EVERY DAY AT NIGHT   traZODone (DESYREL) 50 mg tablet   No No   Sig: Take 1 Tab by mouth nightly.      Facility-Administered Medications: None     No Known Allergies   Social History     Tobacco Use   ??? Smoking status: Former Smoker     Packs/day: 0.50     Years: 20.00     Pack years: 10.00   ??? Smokeless tobacco: Never Used   ??? Tobacco comment: stopped at age 37   Substance Use Topics   ??? Alcohol use: No      Family History   Problem Relation Age of Onset   ??? Stroke Mother    ??? Heart Disease Mother    ??? Cancer  Father    ??? Asthma Father    ??? Lung Disease Father         COPD/smoker   ??? Cancer Brother    ??? Heart Disease Brother    ??? Alcohol abuse Brother       Objective:     Visit Vitals  BP (!) 103/59   Pulse 88   Temp 98.2 ??F (36.8 ??C)   Resp 19   Ht '5\' 2"'  (1.575 m)   Wt 40.8 kg (90 lb)   LMP  (LMP Unknown)   SpO2 95%   BMI 16.46 kg/m??      Temp (24hrs), Avg:99.5 ??F (37.5 ??C), Min:98.2 ??F (36.8 ??C), Max:100.8 ??F (38.2 ??C)    Oxygen Therapy  O2 Sat (%): 95 % (01/10/20 1544)  Pulse via Oximetry: 88 beats per minute (01/10/20 1544)  O2 Device: None (Room air) (01/10/20 1130)  Physical Exam:  General:    Obtunded, no distress  Head:   Normocephalic, without obvious abnormality, atraumatic.  Nose:  Nares normal. No drainage or sinus tenderness.  Lungs:   Clear to auscultation bilaterally. Nonlabored  Heart:   Tachycardic rate  Abdomen:   Soft, non-tender. Not distended.  Bowel sounds normal.   Extremities: No cyanosis.  No edema. No clubbing  Skin:       Not Jaundiced  Neurologic: Not alert or oriented. No focal deficits   Data Review:   Recent Results (from the past 24 hour(s))   CBC WITH AUTOMATED DIFF    Collection Time: 01/10/20  9:20 AM   Result Value Ref Range    WBC 38.3 (H) 4.3 - 11.1 K/uL    RBC 5.87 (H) 4.05 - 5.2 M/uL    HGB 18.0 (H) 11.7 - 15.4 g/dL    HCT 54.9 (H) 35.8 - 46.3 %    MCV 93.5 79.6 - 97.8 FL    MCH 30.7 26.1 - 32.9 PG    MCHC 32.8  31.4 - 35.0 g/dL    RDW 15.4 (H) 11.9 - 14.6 %    PLATELET 379 150 - 450 K/uL    MPV 10.1 9.4 - 12.3 FL    ABSOLUTE NRBC 0.02 0.0 - 0.2 K/uL    DF AUTOMATED      NEUTROPHILS 86 (H) 43 - 78 %    LYMPHOCYTES 4 (L) 13 - 44 %    MONOCYTES 7 4.0 - 12.0 %    EOSINOPHILS 0 (L) 0.5 - 7.8 %    BASOPHILS 0 0.0 - 2.0 %    IMMATURE GRANULOCYTES 3 0.0 - 5.0 %    ABS. NEUTROPHILS 32.9 (H) 1.7 - 8.2 K/UL    ABS. LYMPHOCYTES 1.6 0.5 - 4.6 K/UL    ABS. MONOCYTES 2.6 (H) 0.1 - 1.3 K/UL    ABS. EOSINOPHILS 0.0 0.0 - 0.8 K/UL    ABS. BASOPHILS 0.1 0.0 - 0.2 K/UL    ABS. IMM. GRANS. 1.0 (H) 0.0 -  0.5 K/UL   METABOLIC PANEL, COMPREHENSIVE    Collection Time: 01/10/20  9:20 AM   Result Value Ref Range    Sodium 138 136 - 145 mmol/L    Potassium 5.6 (H) 3.5 - 5.1 mmol/L    Chloride 106 98 - 107 mmol/L    CO2 22 21 - 32 mmol/L    Anion gap 10 7 - 16 mmol/L    Glucose 170 (H) 65 - 100 mg/dL    BUN 96 (H) 8 - 23 MG/DL    Creatinine 1.92 (H) 0.6 - 1.0 MG/DL    GFR est AA 33 (L) >60 ml/min/1.38m    GFR est non-AA 27 (L) >60 ml/min/1.768m   Calcium 10.1 8.3 - 10.4 MG/DL    Bilirubin, total 0.5 0.2 - 1.1 MG/DL    ALT (SGPT) 21 12 - 65 U/L    AST (SGOT) 18 15 - 37 U/L    Alk. phosphatase 99 50 - 136 U/L    Protein, total 8.8 (H) 6.3 - 8.2 g/dL    Albumin 3.5 3.2 - 4.6 g/dL    Globulin 5.3 (H) 2.3 - 3.5 g/dL    A-G Ratio 0.7 (L) 1.2 - 3.5     LACTIC ACID    Collection Time: 01/10/20  9:20 AM   Result Value Ref Range    Lactic acid 3.6 (HH) 0.4 - 2.0 MMOL/L   URINE MICROSCOPIC    Collection Time: 01/10/20  9:20 AM   Result Value Ref Range    WBC >100 0 /hpf    RBC 20-50 0 /hpf    Epithelial cells 0-3 0 /hpf    Bacteria 4+ (H) 0 /hpf    Casts 0 0 /lpf    Crystals, urine 0 0 /LPF    Mucus 0 0 /lpf    Other observations RESULTS VERIFIED MANUALLY     LACTIC ACID    Collection Time: 01/10/20 11:19 AM   Result Value Ref Range    Lactic acid 2.6 (H) 0.4 - 2.0 MMOL/L   MAGNESIUM    Collection Time: 01/10/20 11:19 AM   Result Value Ref Range    Magnesium 2.3 1.8 - 2.4 mg/dL   LACTIC ACID    Collection Time: 01/10/20  1:44 PM   Result Value Ref Range    Lactic acid 2.3 (H) 0.4 - 2.0 MMOL/L     Imaging /Procedures /Studies     Assessment and Plan:     Active Hospital Problems  Diagnosis Date Noted   ??? Severe sepsis ([place]) 01/10/2020   ??? AKI (acute kidney injury) ([place]) 04/02/2019   ??? Acquired hypothyroidism 08/29/2015   ??? Essential hypertension 08/29/2015   ??? Mixed hyperlipidemia 08/29/2015   ??? Frontotemporal dementia ([place]) 02/01/2015     Last Assessment & Plan:   Disinhibited, agitated, aggressive, violent at times. Will add  depakote to try to calm her behaviors down. Might need to adjust namenda, aricept, and paxil, too but will start with depakote. Provided information about the drug, anticipated effects, and possible side effects. Family will call me next week and let me know how she is doing, we may need to increase the dose, but since she is small, we will need to monitor for fall risk.     Daughter inquired about PACE program. She isnt medicaid eligible, I dont think, but out of pocket might be an option for daytime care. She is also going to look into senior day programs. I will refer her daughter to the alz association for additional resources. Might need to send daughter REACH at next appt, too.          PLAN    # Severe sepsis with likely urinary source  - has history of ESBL Klebsiella   - follow blood/urine cultures  - aggressive IVF resuscitation, switch to LR  - trend LA serially until <2  - continue with Merrem per prior sensitivities   - contact precautions   - will need nephrostomy tube removed by IR once more stable    # AKI, likely due to above and poor po intake  - aggressive IVF resuscitation, switch to LR  - avoid nephrotoxic meds, IV contrast, NSAIDs, morphine, narcotics  - renally dose meds as appropriate   - strict I's/O's  - daily BMPs    # HTN  - hold all antihypertensives    # History of dementia  - at baseline, per daughter    F/E/N: fluids as above, replete electrolytes as needed, NPO for now    Ppx: heparin SQ for VTE    Code Status: DNR/DNI (confirmed with patient's daughter, Earnest Bailey)    Disposition: Admit to Inpatient with plan as above. Discussed with patient's daughter at bedside. All questions answered.     Signed By: Debbora Dus, DO     Jan 10, 2020

## 2020-01-10 NOTE — Progress Notes (Signed)
Patient taken to ED by this RN. Patient placed on cardiac monitor with cycling vitals. Report given to [facility]. RN aware that EKG and chest xray ordered but not complete. RN verbalized understanding. Care transferred at this time.

## 2020-01-10 NOTE — ED Notes (Signed)
TRANSFER - OUT REPORT:    Verbal report given to Annie(name) on [name]  being transferred to 9th(unit) for routine progression of care       Report consisted of patient???s Situation, Background, Assessment and   Recommendations(SBAR).     Information from the following report(s) SBAR was reviewed with the receiving nurse.    Lines:   Peripheral IV 01/10/20 Left Antecubital (Active)        Opportunity for questions and clarification was provided.      Patient transported with:   [employer]

## 2020-01-11 ENCOUNTER — Other Ambulatory Visit: Payer: Self-pay | Admitting: Family Medicine

## 2020-01-11 LAB — METABOLIC PANEL, BASIC
Anion gap: 10 mmol/L (ref 7–16)
BUN: 60 MG/DL — ABNORMAL HIGH (ref 8–23)
CO2: 15 mmol/L — ABNORMAL LOW (ref 21–32)
Calcium: 8.3 MG/DL (ref 8.3–10.4)
Chloride: 123 mmol/L — ABNORMAL HIGH (ref 98–107)
Creatinine: 0.78 MG/DL (ref 0.6–1.0)
GFR est AA: >60 mL/min/{1.73_m2} (ref >60)
GFR est non-AA: >60 mL/min/{1.73_m2} (ref >60)
Glucose: 81 mg/dL (ref 65–100)
Potassium: 4 mmol/L (ref 3.5–5.1)
Sodium: 148 mmol/L — ABNORMAL HIGH (ref 136–145)

## 2020-01-11 LAB — CBC WITH AUTOMATED DIFF
ABS. BASOPHILS: 0 10*3/uL (ref 0.0–0.2)
ABS. EOSINOPHILS: 0.1 10*3/uL (ref 0.0–0.8)
ABS. IMM. GRANS.: 0.2 10*3/uL (ref 0.0–0.5)
ABS. LYMPHOCYTES: 1.6 10*3/uL (ref 0.5–4.6)
ABS. MONOCYTES: 1.4 10*3/uL — ABNORMAL HIGH (ref 0.1–1.3)
ABS. NEUTROPHILS: 19.6 10*3/uL — ABNORMAL HIGH (ref 1.7–8.2)
ABSOLUTE NRBC: 0 10*3/uL (ref 0.0–0.2)
BASOPHILS: 0 % (ref 0.0–2.0)
EOSINOPHILS: 0 % — ABNORMAL LOW (ref 0.5–7.8)
HCT: 40.3 % (ref 35.8–46.3)
HGB: 12.4 g/dL (ref 11.7–15.4)
IMMATURE GRANULOCYTES: 1 % (ref 0.0–5.0)
LYMPHOCYTES: 7 % — ABNORMAL LOW (ref 13–44)
MCH: 30.1 PG (ref 26.1–32.9)
MCHC: 30.8 g/dL — ABNORMAL LOW (ref 31.4–35.0)
MCV: 97.8 FL (ref 79.6–97.8)
MONOCYTES: 6 % (ref 4.0–12.0)
MPV: 9.9 FL (ref 9.4–12.3)
NEUTROPHILS: 85 % — ABNORMAL HIGH (ref 43–78)
PLATELET: 194 10*3/uL (ref 150–450)
RBC: 4.12 M/uL (ref 4.05–5.2)
RDW: 15.1 % — ABNORMAL HIGH (ref 11.9–14.6)
WBC: 23 10*3/uL — ABNORMAL HIGH (ref 4.3–11.1)

## 2020-01-11 MED ORDER — OXYCODONE 5 MG TAB
5 mg | Freq: Four times a day (QID) | ORAL | Status: DC | PRN
Start: 2020-01-11 — End: 2020-01-16

## 2020-01-11 MED FILL — MAPAP (ACETAMINOPHEN) 325 MG TABLET: 325 mg | ORAL | Qty: 2

## 2020-01-11 MED FILL — HEPARIN (PORCINE) 5,000 UNIT/ML IJ SOLN: 5000 unit/mL | INTRAMUSCULAR | Qty: 1

## 2020-01-11 MED FILL — MEROPENEM 500 MG IV SOLR: 500 mg | INTRAVENOUS | Qty: 500

## 2020-01-11 MED FILL — ATORVASTATIN 20 MG TAB: 20 mg | ORAL | Qty: 1

## 2020-01-11 MED FILL — ACETAMINOPHEN 650 MG RECTAL SUPPOSITORY: 650 mg | RECTAL | Qty: 1

## 2020-01-11 MED FILL — PAROXETINE 20 MG TAB: 20 mg | ORAL | Qty: 1

## 2020-01-11 NOTE — Progress Notes (Signed)
[employer] Hospitalist Progress Note    Patient: Cathy Jackson MRN: [id number]  SSN: [id number]    Date of Birth: 1946-07-16  Age: 74 y.o.  Sex: female      Admit Date: 01/10/2020    LOS: 1 day     Subjective:     Chief Complaint:   Reason for Admission: Severe sepsis ([place])    74 yo CF with past history of dementia, chronic nephrostomy tubes, GERD, hypothyroidism, and asthma presents from [place] to IR suite to have her nephrostomy tube exchanged and she was found to be hypotensive, febrile, tachycardic and less responsive than normal. Interventional radiology department initiated sepsis protocol.  Patient was given IV antibiotics, IV fluids and lab work was ordered and sent to the lab.  Patient was then transported to the emergency department for evaluation. She appeared to have a UTI, for which she has a history of ESBL Klebsiella UTIs in past.    5/19 - Per daughter, she is more herself today. More alert and interactive. Non-verbal.    Review of systems negative except stated above.    Assessment/Plan (MDM):     Active Medical Complaints and Diagnoses:    Principal Problem:    Severe sepsis ([place]) (01/10/2020)  - In ER WBC 38.3 + HR 118 + RR 31 + UTI + worsened confusion  - Improving  - Continue Meropenem based on previous cultures  - UA consistent with UTI  - Urine culture pending  - Blood cultures pending  - CXR unremarkable   - Lactic down to 2.3    Active Problems:    Urinary tract infection due to ESBL Klebsiella (04/03/2019)  - UA consistent with UTI  - Continue Meropenem based on previous cultures  - Urine culture pending      AKI (acute kidney injury) ([place]) (04/02/2019)  - Resolved  - Continue IVFs      Lactic acidosis (04/03/2019)  - Due to #1  - Continue IVFs  - Continue Meropenem  - Continue checks until <2.0      Essential hypertension (08/29/2015)  - Stable      Frontotemporal dementia ([place]) (02/01/2015)  - Patient not eating/drinking much  - Discussed with daughter and plan is to return to [facility] with  hospice      Otherwise all chronic medical issues appear stable with no changes.  See assessment at bottom of progress note for complete acute, chronic, and resolved hospital medical issues.    Diet: DIET NPO  VTE ppx: Heparin    Objective:     Visit Vitals  BP (!) 96/52 (BP 1 Location: Right arm, BP Patient Position: At rest)   Pulse (!) 109   Temp 97.7 ??F (36.5 ??C)   Resp 18   Ht 5\' 2"  (1.575 m)   Wt 40.8 kg (90 lb)   LMP  (LMP Unknown)   SpO2 98%   BMI 16.46 kg/m??      Oxygen Therapy  O2 Sat (%): 98 % (01/11/20 1218)  Pulse via Oximetry: 88 beats per minute (01/10/20 1544)  O2 Device: None (Room air) (01/10/20 1130)      Intake and Output:     Intake/Output Summary (Last 24 hours) at 01/11/2020 1256  Last data filed at 01/11/2020 0500  Gross per 24 hour   Intake 50 ml   Output 1350 ml   Net -1300 ml         Physical Exam:   GENERAL: alert, cooperative, no distress, appears stated  age  EYE: conjunctivae/corneas clear. PERRL.  THROAT & NECK: normal and no erythema or exudates noted.   LUNG: clear to auscultation bilaterally  HEART: regular rate and rhythm, S1S2, no murmur, no JVD  ABDOMEN: soft, non-tender, non-distended. Bowel sounds normal.  EXTREMITIES:  No edema, 2+ pedal/radial pulses bilaterally  SKIN: no rash or abnormalities  NEUROLOGIC: Alert, non-verbal. Cranial nerves 2-12 grossly intact.    Lab/Data Review:  Recent Results (from the past 24 hour(s))   LACTIC ACID    Collection Time: 01/10/20  1:44 PM   Result Value Ref Range    Lactic acid 2.3 (H) 0.4 - 2.0 MMOL/L   METABOLIC PANEL, BASIC    Collection Time: 01/11/20  5:41 AM   Result Value Ref Range    Sodium 148 (H) 136 - 145 mmol/L    Potassium 4.0 3.5 - 5.1 mmol/L    Chloride 123 (H) 98 - 107 mmol/L    CO2 15 (L) 21 - 32 mmol/L    Anion gap 10 7 - 16 mmol/L    Glucose 81 65 - 100 mg/dL    BUN 60 (H) 8 - 23 MG/DL    Creatinine 0.78 0.6 - 1.0 MG/DL    GFR est AA >60 >60 ml/min/1.2m2    GFR est non-AA >60 >60 ml/min/1.76m2    Calcium 8.3 8.3 - 10.4  MG/DL   CBC WITH AUTOMATED DIFF    Collection Time: 01/11/20  9:23 AM   Result Value Ref Range    WBC 23.0 (H) 4.3 - 11.1 K/uL    RBC 4.12 4.05 - 5.2 M/uL    HGB 12.4 11.7 - 15.4 g/dL    HCT 40.3 35.8 - 46.3 %    MCV 97.8 79.6 - 97.8 FL    MCH 30.1 26.1 - 32.9 PG    MCHC 30.8 (L) 31.4 - 35.0 g/dL    RDW 15.1 (H) 11.9 - 14.6 %    PLATELET 194 150 - 450 K/uL    MPV 9.9 9.4 - 12.3 FL    ABSOLUTE NRBC 0.00 0.0 - 0.2 K/uL    DF AUTOMATED      NEUTROPHILS 85 (H) 43 - 78 %    LYMPHOCYTES 7 (L) 13 - 44 %    MONOCYTES 6 4.0 - 12.0 %    EOSINOPHILS 0 (L) 0.5 - 7.8 %    BASOPHILS 0 0.0 - 2.0 %    IMMATURE GRANULOCYTES 1 0.0 - 5.0 %    ABS. NEUTROPHILS 19.6 (H) 1.7 - 8.2 K/UL    ABS. LYMPHOCYTES 1.6 0.5 - 4.6 K/UL    ABS. MONOCYTES 1.4 (H) 0.1 - 1.3 K/UL    ABS. EOSINOPHILS 0.1 0.0 - 0.8 K/UL    ABS. BASOPHILS 0.0 0.0 - 0.2 K/UL    ABS. IMM. GRANS. 0.2 0.0 - 0.5 K/UL       SARS-CoV-2 Lab Results  "Novel Coronavirus" Test: No results found for: COV2NT   "Emergent Disease" Test: No results found for: EDPR  "SARS-COV-2" Test: No results found for: XGCOVT  "Precision Labs" Test: No results found for: RSLT  Rapid Test:   Lab Results   Component Value Date/Time    COVR Not detected 05/16/2019 09:52 AM           Imaging:  XR CHEST PORT    Result Date: 01/10/2020  EXAM: XR CHEST PORT INDICATION: fever COMPARISON: Chest radiograph, 05/09/2019 FINDINGS: The cardiomediastinal silhouette is within normal limits. Atherosclerotic calcifications in the aorta. Upper lobe  predominant emphysema. No superimposed consolidation. No pleural effusion. No pneumothorax. No acute osseous abnormality.     Emphysema without acute superimposed process.     IR EXCHANGE NEPHRO PERC RT SI    Result Date: 01/03/2020  Indication: Ureteral obstruction. The patient is bedbound. The patient's family requests that we remove the nephrostomy catheter in place and antegrade ureteral stent. Consent: Informed written and oral consent was obtained from the patient's family.  Procedure:  Maximal sterile barrier technique (including:  cap, mask, sterile gown, sterile gloves, sterile sheet, hand hygiene, and chlorhexidene for cutaneous antisepsis) were used.  With the patient prone the skin around the nephroureteral drain  was prepped and draped in the standard fashion. Lidocaine was administered for local field block. Using fluoroscopic guidance, initial attempts at removing the indwelling nephroureteral catheter is coiled resistance. After numerous catheters and sheaths, the existing nephroureteral catheter was removed. It is heavily encrusted in the distal pigtail. After establishing access back into the existing nephrostomy tract, nephrostogram was performed. The images demonstrate extensive debris and a tortuous ureter. Using a wire and catheter, access was initially drained across the anastomosis, through the ileal conduit and into the ostomy bag. Once through and through access was obtained, a 45 cm 10 French pigtail catheter was advanced in a retrograde fashion. The pigtail was formed in the renal pelvis. Also, a new. 10 French nephrostomy was positioned in the renal pelvis..  Contrast was administered confirming appropriate position. The catheter was secured with a suture. A drainage bag was attached. Complications: None. Radiation Exposure Indices: Reference Air Kerma (Ka,r) = 109 mGy Dose Area Product/Kerma Area Product (DAP/KAP/PKA) = [id number] cGy-cm2 Fluoroscopy Exposure Time = 22 minutes 6 seconds Contrast:  20 milliliters. Medications: None. Findings: Moderate hydronephrosis. Debris in the renal pelvis. A heavily encrusted existing catheter..     1. Extensive difficulty in removing the existing nephroureteral catheter. 2. A new pigtail ureteral stent was placed in a retrograde fashion with the pigtail in the renal pelvis and the catheter in the ostomy bag. 3. A nephrostomy tube was left in position.. Plan: The patient will recover for approximately 1 hour prior to discharge  home. Recommend external drainage for one day. Didn't recommend capping the nephrostomy tube. The patient will return in one week for repeat nephrostogram and hopefully nephrostomy tube removal..     No results found for this visit on 01/10/20.    Cultures:  All Micro Results     Procedure Component Value Units Date/Time    CULTURE, BLOOD [id number]     Order Status: Canceled Specimen: Blood           Assessment:     Primary Diagnosis: Severe sepsis [facility])    Hospital Problems as of 01/11/2020 Date Reviewed: June 19, 2018        Codes Class Noted - Resolved POA    * (Principal) Severe sepsis ([place]) ICD-10-CM: A41.9, R65.20  ICD-9-CM: 038.9, 995.92  01/10/2020 - Present Yes        Urinary tract infection due to ESBL Klebsiella ICD-10-CM: N39.0, B96.89  ICD-9-CM: 599.0, 041.84  04/03/2019 - Present Yes        AKI (acute kidney injury) ([place]) ICD-10-CM: N17.9  ICD-9-CM: 584.9  04/02/2019 - Present Yes        Lactic acidosis ICD-10-CM: E87.2  ICD-9-CM: 276.2  04/03/2019 - Present Yes        Essential hypertension ICD-10-CM: I10  ICD-9-CM: 401.9  08/29/2015 - Present Yes        Frontotemporal  dementia [facility]) ICD-10-CM: G31.09, F02.80  ICD-9-CM: 331.19  02/01/2015 - Present Yes    Overview Signed 10/20/2017 10:25 AM by Wilfred Lacy, NP     Last Assessment & Plan:   Disinhibited, agitated, aggressive, violent at times. Will add depakote to try to calm her behaviors down. Might need to adjust namenda, aricept, and paxil, too but will start with depakote. Provided information about the drug, anticipated effects, and possible side effects. Family will call me next week and let me know how she is doing, we may need to increase the dose, but since she is small, we will need to monitor for fall risk.     Daughter inquired about PACE program. She isnt medicaid eligible, I dont think, but out of pocket might be an option for daytime care. She is also going to look into senior day programs. I will refer her daughter to the alz association for  additional resources. Might need to send daughter REACH at next appt, too.              Hypoalbuminemia ICD-10-CM: E88.09  ICD-9-CM: 273.8  04/03/2019 - Present Yes        Acquired hypothyroidism ICD-10-CM: E03.9  ICD-9-CM: 244.9  08/29/2015 - Present Yes        Mixed hyperlipidemia ICD-10-CM: E78.2  ICD-9-CM: 272.2  08/29/2015 - Present Yes                Discharge Plan:     To [place] with hospice once urine culture resulted    Signed By: Faythe Casa, DO     Jan 11, 2020

## 2020-01-11 NOTE — Progress Notes (Signed)
Chaplain visit patient and family at bedside, offered support, assurance of spiritual care staff's prayers.    Cathy Jackson  Chaplain PRN

## 2020-01-11 NOTE — Progress Notes (Signed)
Problem: Falls - Risk of  Goal: *Absence of Falls  Description: Document Cathy Jackson Fall Risk and appropriate interventions in the flowsheet.  Outcome: Progressing Towards Goal  Note: Fall Risk Interventions:       Mentation Interventions: Adequate sleep, hydration, pain control, Bed/chair exit alarm, Door open when patient unattended, Familiar objects from home, Family/sitter at bedside         Elimination Interventions: Bed/chair exit alarm, Call light in reach, Patient to call for help with toileting needs

## 2020-01-11 NOTE — Progress Notes (Signed)
Chart reviewed. Plan for d/c is pt to return to [place]. CM to continue to follow and monitor for any further needs.

## 2020-01-11 NOTE — Progress Notes (Signed)
0745 - Report received from night shift RN. Patient assessed. No distress at this time. Will continue to monitor.    1000 - Family at bedside updated at this time. Questions asked and answered. Further questions denied.     1200 - Patient has no requests at this time. Call bell, phone in reach.

## 2020-01-12 LAB — CBC WITH AUTOMATED DIFF
ABS. BASOPHILS: 0 10*3/uL (ref 0.0–0.2)
ABS. EOSINOPHILS: 0.1 10*3/uL (ref 0.0–0.8)
ABS. IMM. GRANS.: 0.2 10*3/uL (ref 0.0–0.5)
ABS. LYMPHOCYTES: 1.9 10*3/uL (ref 0.5–4.6)
ABS. MONOCYTES: 1.1 10*3/uL (ref 0.1–1.3)
ABS. NEUTROPHILS: 12.7 10*3/uL — ABNORMAL HIGH (ref 1.7–8.2)
ABSOLUTE NRBC: 0 10*3/uL (ref 0.0–0.2)
BASOPHILS: 0 % (ref 0.0–2.0)
EOSINOPHILS: 1 % (ref 0.5–7.8)
HCT: 38.4 % (ref 35.8–46.3)
HGB: 11.8 g/dL (ref 11.7–15.4)
IMMATURE GRANULOCYTES: 1 % (ref 0.0–5.0)
LYMPHOCYTES: 12 % — ABNORMAL LOW (ref 13–44)
MCH: 30.6 PG (ref 26.1–32.9)
MCHC: 30.7 g/dL — ABNORMAL LOW (ref 31.4–35.0)
MCV: 99.7 FL — ABNORMAL HIGH (ref 79.6–97.8)
MONOCYTES: 7 % (ref 4.0–12.0)
MPV: 9.9 FL (ref 9.4–12.3)
NEUTROPHILS: 79 % — ABNORMAL HIGH (ref 43–78)
PLATELET: 147 10*3/uL — ABNORMAL LOW (ref 150–450)
RBC: 3.85 M/uL — ABNORMAL LOW (ref 4.05–5.2)
RDW: 15.4 % — ABNORMAL HIGH (ref 11.9–14.6)
WBC: 16 10*3/uL — ABNORMAL HIGH (ref 4.3–11.1)

## 2020-01-12 LAB — LACTIC ACID: Lactic acid: 1.3 MMOL/L (ref 0.4–2.0)

## 2020-01-12 LAB — METABOLIC PANEL, BASIC
Anion gap: 5 mmol/L — ABNORMAL LOW (ref 7–16)
Anion gap: 7 mmol/L (ref 7–16)
Anion gap: 8 mmol/L (ref 7–16)
BUN: 24 MG/DL — ABNORMAL HIGH (ref 8–23)
BUN: 35 MG/DL — ABNORMAL HIGH (ref 8–23)
BUN: 41 MG/DL — ABNORMAL HIGH (ref 8–23)
CO2: 22 mmol/L (ref 21–32)
CO2: 24 mmol/L (ref 21–32)
CO2: 25 mmol/L (ref 21–32)
Calcium: 6.6 MG/DL — ABNORMAL LOW (ref 8.3–10.4)
Calcium: 7.8 MG/DL — ABNORMAL LOW (ref 8.3–10.4)
Calcium: 8 MG/DL — ABNORMAL LOW (ref 8.3–10.4)
Chloride: 101 mmol/L (ref 98–107)
Chloride: 122 mmol/L — ABNORMAL HIGH (ref 98–107)
Chloride: 124 mmol/L — ABNORMAL HIGH (ref 98–107)
Creatinine: 0.59 MG/DL — ABNORMAL LOW (ref 0.6–1.0)
Creatinine: 0.61 MG/DL (ref 0.6–1.0)
Creatinine: 0.67 MG/DL (ref 0.6–1.0)
GFR est AA: >60 mL/min/{1.73_m2} (ref >60)
GFR est AA: >60 mL/min/{1.73_m2} (ref >60)
GFR est AA: >60 mL/min/{1.73_m2} (ref >60)
GFR est non-AA: >60 mL/min/{1.73_m2} (ref >60)
GFR est non-AA: >60 mL/min/{1.73_m2} (ref >60)
GFR est non-AA: >60 mL/min/{1.73_m2} (ref >60)
Glucose: 555 mg/dL — CR (ref 65–100)
Glucose: 80 mg/dL (ref 65–100)
Glucose: 87 mg/dL (ref 65–100)
Potassium: 3.1 mmol/L — ABNORMAL LOW (ref 3.5–5.1)
Potassium: 3.4 mmol/L — ABNORMAL LOW (ref 3.5–5.1)
Potassium: 3.5 mmol/L (ref 3.5–5.1)
Sodium: 131 mmol/L — ABNORMAL LOW (ref 136–145)
Sodium: 153 mmol/L — ABNORMAL HIGH (ref 136–145)
Sodium: 154 mmol/L — ABNORMAL HIGH (ref 136–145)

## 2020-01-12 LAB — GLUCOSE, POC: Glucose (POC): 105 mg/dL — ABNORMAL HIGH (ref 65–100)

## 2020-01-12 LAB — PROCALCITONIN: Procalcitonin: 0.11 ng/mL

## 2020-01-12 MED ORDER — DEXTROSE 5% IN WATER (D5W) IV
INTRAVENOUS | Status: DC
Start: 2020-01-12 — End: 2020-01-13
  Administered 2020-01-12 – 2020-01-13 (×2): via INTRAVENOUS

## 2020-01-12 MED FILL — HEPARIN (PORCINE) 5,000 UNIT/ML IJ SOLN: 5000 unit/mL | INTRAMUSCULAR | Qty: 1

## 2020-01-12 MED FILL — MEROPENEM 500 MG IV SOLR: 500 mg | INTRAVENOUS | Qty: 500

## 2020-01-12 MED FILL — PAROXETINE 20 MG TAB: 20 mg | ORAL | Qty: 1

## 2020-01-12 MED FILL — ATORVASTATIN 20 MG TAB: 20 mg | ORAL | Qty: 1

## 2020-01-12 MED FILL — DEXTROSE 5% IN WATER (D5W) IV: INTRAVENOUS | Qty: 1000

## 2020-01-12 NOTE — Progress Notes (Signed)
[employer] Hospitalist Progress Note    Patient: Cathy Jackson MRN: [id number]  SSN: [id number]    Date of Birth: 13-Jul-1946  Age: 74 y.o.  Sex: female      Admit Date: 01/10/2020    LOS: 2 days     Subjective:     Chief Complaint:   Reason for Admission: Severe sepsis ([place])    74 yo CF with past history of dementia, chronic nephrostomy tubes, GERD, hypothyroidism, and asthma presents from [place] to IR suite to have her nephrostomy tube exchanged and she was found to be hypotensive, febrile, tachycardic and less responsive than normal. Interventional radiology department initiated sepsis protocol.  Patient was given IV antibiotics, IV fluids and lab work was ordered and sent to the lab.  Patient was then transported to the emergency department for evaluation. She appeared to have a UTI, for which she has a history of ESBL Klebsiella UTIs in past.    5/19 - Per daughter, she is more herself today. More alert and interactive. Non-verbal.    5/20-patient noncommunicative today and clearly confused and is a poor historian        Assessment/Plan (MDM):     Active Medical Complaints and Diagnoses:    Principal Problem:    Severe sepsis ([place]) (01/10/2020)  - In ER WBC 38.3 + HR 118 + RR 31 + UTI + worsened confusion  - Improving  - Continue Meropenem based on previous cultures  - UA consistent with UTI  - Urine culture pending  - Blood cultures pending  - CXR unremarkable   - Lactic down to 2.3  5/20-clinically improving and leukocytosis is downtrending.  Continue meropenem based on prior cultures and await cultures and sensitivities.  Blood cultures negative to date    Active Problems:    Urinary tract infection due to ESBL Klebsiella (04/03/2019)  - UA consistent with UTI  - Continue Meropenem based on previous cultures  - Urine culture pending  5/20-continue meropenem based on prior cultures and await cultures and sensitivities.      AKI (acute kidney injury) ([place]) (04/02/2019)  - Resolved  - Continue IVFs   5/20-resolved    Hypernatremia  5/20-we will start D5W at 100 cc/h and clinically monitor patient's BMP every 8 hours.      Lactic acidosis (04/03/2019)  - Due to #1  - Continue IVFs  - Continue Meropenem  - Continue checks until <2.0  5/20-resolved      Essential hypertension (08/29/2015)  - Stable      Frontotemporal dementia ([place]) (02/01/2015)  - Patient not eating/drinking much  - Discussed with daughter and plan is to return to [facility] with hosp[facility]    Diet: DIET PUREED  DIET NUTRITIONAL SUPPLEMENTS All Meals; Ensure High Protein ( )  DIET NUTRITIONAL SUPPLEMENTS All Meals; Magic Cups ( )  VTE ppx: Heparin    Objective:     Visit Vitals  BP (!) 92/54   Pulse 91   Temp 98.1 ??F (36.7 ??C)   Resp 18   Ht 5\' 2"  (1.575 m)   Wt 40.8 kg (90 lb)   LMP  (LMP Unknown)   SpO2 98%   BMI 16.46 kg/m??      Oxygen Therapy  O2 Sat (%): 98 % (01/12/20 1219)  Pulse via Oximetry: 88 beats per minute (01/10/20 1544)  O2 Device: None (Room air) (01/12/20 0319)      Intake and Output:     Intake/Output Summary (Last 24 hours) at 01/12/2020  [place] filed at 01/12/2020 [id number]  Gross per 24 hour   Intake 710 ml   Output 1175 ml   Net -465 ml         Physical Exam:   GENERAL: alert, cooperative, no distress, appears stated age  EYE: conjunctivae/corneas clear. PERRL.  THROAT & NECK: normal and no erythema or exudates noted.   LUNG: clear to auscultation bilaterally  HEART: regular rate and rhythm, S1S2, no murmur, no JVD  ABDOMEN: soft, non-tender, non-distended. Bowel sounds normal.  EXTREMITIES:  No edema, 2+ pedal/radial pulses bilaterally  SKIN: no rash or abnormalities  NEUROLOGIC: Alert, non-verbal. Cranial nerves 2-12 grossly intact.    Lab/Data Review:  Recent Results (from the past 24 hour(s))   METABOLIC PANEL, BASIC    Collection Time: 01/12/20  3:35 AM   Result Value Ref Range    Sodium 154 (H) 136 - 145 mmol/L    Potassium 3.5 3.5 - 5.1 mmol/L    Chloride 124 (H) 98 - 107 mmol/L    CO2 25 21 - 32 mmol/L    Anion gap 5 (L) 7 - 16  mmol/L    Glucose 80 65 - 100 mg/dL    BUN 41 (H) 8 - 23 MG/DL    Creatinine 0.67 0.6 - 1.0 MG/DL    GFR est AA >60 >60 ml/min/1.36m2    GFR est non-AA >60 >60 ml/min/1.27m2    Calcium 8.0 (L) 8.3 - 10.4 MG/DL   CBC WITH AUTOMATED DIFF    Collection Time: 01/12/20  3:35 AM   Result Value Ref Range    WBC 16.0 (H) 4.3 - 11.1 K/uL    RBC 3.85 (L) 4.05 - 5.2 M/uL    HGB 11.8 11.7 - 15.4 g/dL    HCT 38.4 35.8 - 46.3 %    MCV 99.7 (H) 79.6 - 97.8 FL    MCH 30.6 26.1 - 32.9 PG    MCHC 30.7 (L) 31.4 - 35.0 g/dL    RDW 15.4 (H) 11.9 - 14.6 %    PLATELET 147 (L) 150 - 450 K/uL    MPV 9.9 9.4 - 12.3 FL    ABSOLUTE NRBC 0.00 0.0 - 0.2 K/uL    DF AUTOMATED      NEUTROPHILS 79 (H) 43 - 78 %    LYMPHOCYTES 12 (L) 13 - 44 %    MONOCYTES 7 4.0 - 12.0 %    EOSINOPHILS 1 0.5 - 7.8 %    BASOPHILS 0 0.0 - 2.0 %    IMMATURE GRANULOCYTES 1 0.0 - 5.0 %    ABS. NEUTROPHILS 12.7 (H) 1.7 - 8.2 K/UL    ABS. LYMPHOCYTES 1.9 0.5 - 4.6 K/UL    ABS. MONOCYTES 1.1 0.1 - 1.3 K/UL    ABS. EOSINOPHILS 0.1 0.0 - 0.8 K/UL    ABS. BASOPHILS 0.0 0.0 - 0.2 K/UL    ABS. IMM. GRANS. 0.2 0.0 - 0.5 K/UL   LACTIC ACID    Collection Time: 01/12/20  3:35 AM   Result Value Ref Range    Lactic acid 1.3 0.4 - 2.0 MMOL/L   PROCALCITONIN    Collection Time: 01/12/20  3:35 AM   Result Value Ref Range    Procalcitonin 123XX123 ng/mL   METABOLIC PANEL, BASIC    Collection Time: 01/12/20 11:10 AM   Result Value Ref Range    Sodium 153 (H) 136 - 145 mmol/L    Potassium 3.4 (L) 3.5 - 5.1 mmol/L  Chloride 122 (H) 98 - 107 mmol/L    CO2 24 21 - 32 mmol/L    Anion gap 7 7 - 16 mmol/L    Glucose 87 65 - 100 mg/dL    BUN 35 (H) 8 - 23 MG/DL    Creatinine 0.59 (L) 0.6 - 1.0 MG/DL    GFR est AA >60 >60 ml/min/1.3m2    GFR est non-AA >60 >60 ml/min/1.52m2    Calcium 7.8 (L) 8.3 - 10.4 MG/DL       SARS-CoV-2 Lab Results  "Novel Coronavirus" Test: No results found for: COV2NT   "Emergent Disease" Test: No results found for: EDPR  "SARS-COV-2" Test: No results found for: XGCOVT   "Precision Labs" Test: No results found for: RSLT  Rapid Test:   Lab Results   Component Value Date/Time    COVR Not detected 05/16/2019 09:52 AM           Imaging:  XR CHEST PORT    Result Date: 01/10/2020  EXAM: XR CHEST PORT INDICATION: fever COMPARISON: Chest radiograph, 05/09/2019 FINDINGS: The cardiomediastinal silhouette is within normal limits. Atherosclerotic calcifications in the aorta. Upper lobe predominant emphysema. No superimposed consolidation. No pleural effusion. No pneumothorax. No acute osseous abnormality.     Emphysema without acute superimposed process.     IR EXCHANGE NEPHRO PERC RT SI    Result Date: 01/03/2020  Indication: Ureteral obstruction. The patient is bedbound. The patient's family requests that we remove the nephrostomy catheter in place and antegrade ureteral stent. Consent: Informed written and oral consent was obtained from the patient's family. Procedure:  Maximal sterile barrier technique (including:  cap, mask, sterile gown, sterile gloves, sterile sheet, hand hygiene, and chlorhexidene for cutaneous antisepsis) were used.  With the patient prone the skin around the nephroureteral drain  was prepped and draped in the standard fashion. Lidocaine was administered for local field block. Using fluoroscopic guidance, initial attempts at removing the indwelling nephroureteral catheter is coiled resistance. After numerous catheters and sheaths, the existing nephroureteral catheter was removed. It is heavily encrusted in the distal pigtail. After establishing access back into the existing nephrostomy tract, nephrostogram was performed. The images demonstrate extensive debris and a tortuous ureter. Using a wire and catheter, access was initially drained across the anastomosis, through the ileal conduit and into the ostomy bag. Once through and through access was obtained, a 45 cm 10 French pigtail catheter was advanced in a retrograde fashion. The pigtail was formed in the renal pelvis.  Also, a new. 10 French nephrostomy was positioned in the renal pelvis..  Contrast was administered confirming appropriate position. The catheter was secured with a suture. A drainage bag was attached. Complications: None. Radiation Exposure Indices: Reference Air Kerma (Ka,r) = 109 mGy Dose Area Product/Kerma Area Product (DAP/KAP/PKA) = [id number] cGy-cm2 Fluoroscopy Exposure Time = 22 minutes 6 seconds Contrast:  20 milliliters. Medications: None. Findings: Moderate hydronephrosis. Debris in the renal pelvis. A heavily encrusted existing catheter..     1. Extensive difficulty in removing the existing nephroureteral catheter. 2. A new pigtail ureteral stent was placed in a retrograde fashion with the pigtail in the renal pelvis and the catheter in the ostomy bag. 3. A nephrostomy tube was left in position.. Plan: The patient will recover for approximately 1 hour prior to discharge home. Recommend external drainage for one day. Didn't recommend capping the nephrostomy tube. The patient will return in one week for repeat nephrostogram and hopefully nephrostomy tube removal..     No results  found for this visit on 01/10/20.    Cultures:  All Micro Results     Procedure Component Value Units Date/Time    CULTURE, URINE [id number] Collected: 01/10/20 0920    Order Status: Completed Specimen: Cath Urine Updated: 01/12/20 1116    CULTURE, BLOOD [id number]     Order Status: Canceled Specimen: Blood           Assessment:     Primary Diagnosis: Severe sepsis [facility])    Hospital Problems as of 01/12/2020 Date Reviewed: Jun 10, 2018        Codes Class Noted - Resolved POA    * (Principal) Severe sepsis ([place]) ICD-10-CM: A41.9, R65.20  ICD-9-CM: 038.9, 995.92  01/10/2020 - Present Yes        Urinary tract infection due to ESBL Klebsiella ICD-10-CM: N39.0, B96.89  ICD-9-CM: 599.0, 041.84  04/03/2019 - Present Yes        Lactic acidosis ICD-10-CM: E87.2  ICD-9-CM: 276.2  04/03/2019 - Present Yes        Hypoalbuminemia ICD-10-CM: E88.09   ICD-9-CM: 273.8  04/03/2019 - Present Yes        AKI (acute kidney injury) ([place]) ICD-10-CM: N17.9  ICD-9-CM: 584.9  04/02/2019 - Present Yes        Acquired hypothyroidism ICD-10-CM: E03.9  ICD-9-CM: 244.9  08/29/2015 - Present Yes        Essential hypertension ICD-10-CM: I10  ICD-9-CM: 401.9  08/29/2015 - Present Yes        Mixed hyperlipidemia ICD-10-CM: E78.2  ICD-9-CM: 272.2  08/29/2015 - Present Yes        Frontotemporal dementia ([place]) ICD-10-CM: G31.09, F02.80  ICD-9-CM: 331.19  02/01/2015 - Present Yes    Overview Signed 10/20/2017 10:25 AM by Wilfred Lacy, NP     Last Assessment & Plan:   Disinhibited, agitated, aggressive, violent at times. Will add depakote to try to calm her behaviors down. Might need to adjust namenda, aricept, and paxil, too but will start with depakote. Provided information about the drug, anticipated effects, and possible side effects. Family will call me next week and let me know how she is doing, we may need to increase the dose, but since she is small, we will need to monitor for fall risk.     Daughter inquired about PACE program. She isnt medicaid eligible, I dont think, but out of pocket might be an option for daytime care. She is also going to look into senior day programs. I will refer her daughter to the alz association for additional resources. Might need to send daughter REACH at next appt, too.                      Discharge Plan:     To [place] with hospice once urine culture resulted    Signed By: Estevan Oaks, DO     Jan 12, 2020

## 2020-01-12 NOTE — Progress Notes (Signed)
Contacted [place] liaison and pt can return when medically ready and will need a COVID swab. Reviewed chart and Hospitalist waiting for urine culture to result. No urine cx ran. Contacted lab and lab added on specimen from 5/18 urine sample. Dr. Florentina Addison aware and this normally takes about 48 hours. Pt sodium elevated. Pt cannot return to SNF over weekend so pt will likely not d/c till Monday. Pt will need COVID swab waiting liaison to make aware when swab should be complete to meet the SNF requirements. CM to continue to follow and monitor for any further needs.

## 2020-01-12 NOTE — Progress Notes (Signed)
Problem: Risk for Spread of Infection  Goal: Prevent transmission of infectious organism to others  Description: Prevent the transmission of infectious organisms to other patients, staff members, and visitors.  Outcome: Progressing Towards Goal     Problem: Patient Education:  Go to Education Activity  Goal: Patient/Family Education  Outcome: Progressing Towards Goal     Problem: Pressure Injury - Risk of  Goal: *Prevention of pressure injury  Description: Document Braden Scale and appropriate interventions in the flowsheet.  Outcome: Progressing Towards Goal  Note: Pressure Injury Interventions:  Sensory Interventions: Assess changes in LOC    Moisture Interventions: Apply protective barrier, creams and emollients    Activity Interventions: Assess need for specialty bed    Mobility Interventions: Assess need for specialty bed, HOB 30 degrees or less, Float heels, Pressure redistribution bed/mattress (bed type), Turn and reposition approx. every two hours(pillow and wedges)    Nutrition Interventions: Offer support with meals,snacks and hydration    Friction and Shear Interventions: Apply protective barrier, creams and emollients                Problem: Patient Education: Go to Patient Education Activity  Goal: Patient/Family Education  Outcome: Progressing Towards Goal     Problem: Falls - Risk of  Goal: *Absence of Falls  Description: Document Schmid Fall Risk and appropriate interventions in the flowsheet.  Outcome: Progressing Towards Goal  Note: Fall Risk Interventions:       Mentation Interventions: Adequate sleep, hydration, pain control         Elimination Interventions: Bed/chair exit alarm              Problem: Patient Education: Go to Patient Education Activity  Goal: Patient/Family Education  Outcome: Progressing Towards Goal     Problem: Patient Education: Go to Patient Education Activity  Goal: Patient/Family Education  Outcome: Progressing Towards Goal     Problem: Patient Education: Go to Patient  Education Activity  Goal: Patient/Family Education  Outcome: Progressing Towards Goal

## 2020-01-13 LAB — CBC WITH AUTOMATED DIFF
ABS. BASOPHILS: 0 10*3/uL (ref 0.0–0.2)
ABS. EOSINOPHILS: 0.1 10*3/uL (ref 0.0–0.8)
ABS. IMM. GRANS.: 0.2 10*3/uL (ref 0.0–0.5)
ABS. LYMPHOCYTES: 2.1 10*3/uL (ref 0.5–4.6)
ABS. MONOCYTES: 0.8 10*3/uL (ref 0.1–1.3)
ABS. NEUTROPHILS: 6.1 10*3/uL (ref 1.7–8.2)
ABSOLUTE NRBC: 0.02 10*3/uL (ref 0.0–0.2)
BASOPHILS: 0 % (ref 0.0–2.0)
EOSINOPHILS: 1 % (ref 0.5–7.8)
HCT: 36.3 % (ref 35.8–46.3)
HGB: 11.1 g/dL — ABNORMAL LOW (ref 11.7–15.4)
IMMATURE GRANULOCYTES: 2 % (ref 0.0–5.0)
LYMPHOCYTES: 22 % (ref 13–44)
MCH: 30.6 PG (ref 26.1–32.9)
MCHC: 30.6 g/dL — ABNORMAL LOW (ref 31.4–35.0)
MCV: 100 FL — ABNORMAL HIGH (ref 79.6–97.8)
MONOCYTES: 9 % (ref 4.0–12.0)
MPV: 9.9 FL (ref 9.4–12.3)
NEUTROPHILS: 65 % (ref 43–78)
PLATELET: 118 10*3/uL — ABNORMAL LOW (ref 150–450)
RBC: 3.63 M/uL — ABNORMAL LOW (ref 4.05–5.2)
RDW: 14.7 % — ABNORMAL HIGH (ref 11.9–14.6)
WBC: 9.4 10*3/uL (ref 4.3–11.1)

## 2020-01-13 LAB — METABOLIC PANEL, BASIC
Anion gap: 5 mmol/L — ABNORMAL LOW (ref 7–16)
Anion gap: 6 mmol/L — ABNORMAL LOW (ref 7–16)
BUN: 25 MG/DL — ABNORMAL HIGH (ref 8–23)
BUN: 22 MG/DL (ref 8–23)
CO2: 26 mmol/L (ref 21–32)
CO2: 26 mmol/L (ref 21–32)
Calcium: 7.4 MG/DL — ABNORMAL LOW (ref 8.3–10.4)
Calcium: 7.4 MG/DL — ABNORMAL LOW (ref 8.3–10.4)
Chloride: 114 mmol/L — ABNORMAL HIGH (ref 98–107)
Chloride: 110 mmol/L — ABNORMAL HIGH (ref 98–107)
Creatinine: 0.44 MG/DL — ABNORMAL LOW (ref 0.6–1.0)
Creatinine: 0.43 MG/DL — ABNORMAL LOW (ref 0.6–1.0)
GFR est AA: >60 mL/min/{1.73_m2} (ref >60)
GFR est AA: >60 mL/min/{1.73_m2} (ref >60)
GFR est non-AA: >60 mL/min/{1.73_m2} (ref >60)
GFR est non-AA: >60 mL/min/{1.73_m2} (ref >60)
Glucose: 70 mg/dL (ref 65–100)
Glucose: 77 mg/dL (ref 65–100)
Potassium: 3.4 mmol/L — ABNORMAL LOW (ref 3.5–5.1)
Potassium: 3.4 mmol/L — ABNORMAL LOW (ref 3.5–5.1)
Sodium: 145 mmol/L (ref 136–145)
Sodium: 142 mmol/L (ref 136–145)

## 2020-01-13 MED ORDER — MEROPENEM 500 MG IV SOLR
500 mg | Freq: Three times a day (TID) | INTRAVENOUS | Status: DC
Start: 2020-01-13 — End: 2020-01-15
  Administered 2020-01-14 – 2020-01-15 (×5): via INTRAVENOUS

## 2020-01-13 MED FILL — MAPAP (ACETAMINOPHEN) 325 MG TABLET: 325 mg | ORAL | Qty: 2

## 2020-01-13 MED FILL — ATORVASTATIN 20 MG TAB: 20 mg | ORAL | Qty: 1

## 2020-01-13 MED FILL — HEPARIN (PORCINE) 5,000 UNIT/ML IJ SOLN: 5000 unit/mL | INTRAMUSCULAR | Qty: 1

## 2020-01-13 MED FILL — MEROPENEM 500 MG IV SOLR: 500 mg | INTRAVENOUS | Qty: 500

## 2020-01-13 MED FILL — PAROXETINE 20 MG TAB: 20 mg | ORAL | Qty: 1

## 2020-01-13 NOTE — Progress Notes (Addendum)
Comprehensive Nutrition Assessment    Type and Reason for Visit: Initial, Wound  Wounds (false wound trigger)    Nutrition Recommendations/Plan:   ??? Continue current diet  ??? Change Ensure High Protein to Ensure Enlive TID  ??? Continue Magic Cup TID with meals  ??? Weigh pt for accuracy  ??? 1:1 feeding assistance  ??? Provided po meds with supplement to increase kcal and protein intake during the day  ??? If within goals of care, consider NGT to meet nutrition needs short term. If TF are pursued, please consult RD for TF management.     Malnutrition Assessment:  Malnutrition Status: At risk for malnutrition (specify) (hx of dementia and poor po intake during admission)    Nutrition Assessment:   Nutrition History: Unable to assess.      Nutrition Background: Pt presents for nephrostomy tube removal; ho wever, was noted to be   Daily Update:  Pt seen laying in bed with lunch tray untouched. Pt doesn't provide information regarding intake, but states "no" when asked if she is hungry. RN states pt is eat bites a meals at best. No family present during visit. Attempted to call daughter regarding pt's intake, but call went straight to voicemail. Noted pt to have poor po intake during previous admissions. Per RD note 04/03/19, daughter acknowledged pt's intake has been declining for months. Pt does not have a wound at this time per RN. RN states wound appears to be healed, but isn't open.    Pertinent Labs:     Lab Results   Component Value Date/Time    NA 142 01/13/2020 05:11 AM    K 3.4 (L) 01/13/2020 05:11 AM    CL 110 (H) 01/13/2020 05:11 AM    CO2 26 01/13/2020 05:11 AM    AGAP 6 (L) 01/13/2020 05:11 AM    GLU 77 01/13/2020 05:11 AM    BUN 22 01/13/2020 05:11 AM    CREA 0.43 (L) 01/13/2020 05:11 AM    GFRAA >60 01/13/2020 05:11 AM    GFRNA >60 01/13/2020 05:11 AM    CA 7.4 (L) 01/13/2020 05:11 AM     Noted decreased potassium 5/12. Hypernatremia resolved 5/20 after receiving D5.    Nutrition Related Findings:   Mild temporal  wasting Wound Type:  (pressure injury documented; no wound present)    Current Nutrition Therapies:  DIET PUREED  DIET NUTRITIONAL SUPPLEMENTS All Meals; Ensure High Protein ( )  DIET NUTRITIONAL SUPPLEMENTS All Meals; Magic Cups ( )    Current Intake:   Average Meal Intake: 0% (bites at meals) Average Supplement Intake: Unable to assess      Anthropometric Measures:  Height: 5\' 2"  (157.5 cm)  Current Body Wt: 40.8 kg (89 lb 15.2 oz) (5/18), Weight source: Not specified  BMI: 16.4, Underweight (BMI less than 22) age over 54  Admission Body Weight: 89 lb 15.2 oz (5/18; not specified)  Ideal Body Weight (lbs) (Calculated): 110 lbs (50 kg), 81.8 %          Edema: No data recorded   Estimated Daily Nutrient Needs:  Energy (kcal/day): 1224-1428 (Kcal/kg (30-35), Weight Used: Admission (40.8kg))  Protein (g/day): 49-57 Weight Used: (Admission (40.8kg))  Fluid (ml/day):   (1 ml/kcal)    Nutrition Diagnosis:   ?? Inadequate oral intake related to cognitive or neurological impairment as evidenced by intake 0-25% (confusion)    Nutrition Interventions:   Food and/or Nutrient Delivery: Continue current diet, Modify oral nutrition supplement     Coordination  of Nutrition Care: Continue to monitor while inpatient  Plan of Care discussed with Claiborne Billings, RN    Goals:      Active Goal: Meet >50% estimated nutrition needs within 7 days    Nutrition Monitoring and Evaluation:      Food/Nutrient Intake Outcomes: Food and nutrient intake, Supplement intake       Discharge Planning:    Too soon to determine    Electronically signed by Donald Siva MS, RDN, LD 01/13/2020 at 5:07 PM  Contact: [phone]

## 2020-01-13 NOTE — Progress Notes (Signed)
Waiting blood and urine cultures at this time. Meropenum increased from every 12 hours to every 8 hours. Pt not medically ready for d/c at this time. Per [place] swab needed Saturday, 5/22. Primary RN aware and order placed. CM to continue to follow and monitor for any further needs.

## 2020-01-13 NOTE — Progress Notes (Signed)
Problem: Risk for Spread of Infection  Goal: Prevent transmission of infectious organism to others  Description: Prevent the transmission of infectious organisms to other patients, staff members, and visitors.  Outcome: Progressing Towards Goal     Problem: Patient Education:  Go to Education Activity  Goal: Patient/Family Education  Outcome: Progressing Towards Goal     Problem: Pressure Injury - Risk of  Goal: *Prevention of pressure injury  Description: Document Braden Scale and appropriate interventions in the flowsheet.  Outcome: Progressing Towards Goal  Note: Pressure Injury Interventions:  Sensory Interventions: Assess changes in LOC, Check visual cues for pain, Float heels, Keep linens dry and wrinkle-free, Maintain/enhance activity level, Minimize linen layers    Moisture Interventions: Absorbent underpads, Apply protective barrier, creams and emollients, Check for incontinence Q2 hours and as needed, Internal/External urinary devices    Activity Interventions: Assess need for specialty bed    Mobility Interventions: Assess need for specialty bed, HOB 30 degrees or less, Pressure redistribution bed/mattress (bed type), Turn and reposition approx. every two hours(pillow and wedges)    Nutrition Interventions: Offer support with meals,snacks and hydration    Friction and Shear Interventions: Apply protective barrier, creams and emollients, HOB 30 degrees or less, Lift sheet, Lift team/patient mobility team                Problem: Patient Education: Go to Patient Education Activity  Goal: Patient/Family Education  Outcome: Progressing Towards Goal     Problem: Falls - Risk of  Goal: *Absence of Falls  Description: Document Patrcia Dolly Fall Risk and appropriate interventions in the flowsheet.  Outcome: Progressing Towards Goal  Note: Fall Risk Interventions:       Mentation Interventions: Adequate sleep, hydration, pain control         Elimination Interventions: Bed/chair exit alarm, Call light in reach, Patient  to call for help with toileting needs, Toileting schedule/hourly rounds              Problem: Patient Education: Go to Patient Education Activity  Goal: Patient/Family Education  Outcome: Progressing Towards Goal

## 2020-01-13 NOTE — Progress Notes (Signed)
[facility] Hospitalist Progress Note    Patient: Cathy Jackson MRN: [id number]  SSN: [id number]    Date of Birth: 1946-01-17  Age: 74 y.o.  Sex: female      Admit Date: 01/10/2020    LOS: 3 days     Subjective:     Chief Complaint:   Reason for Admission: Severe sepsis ([place])    74 yo CF with past history of dementia, chronic nephrostomy tubes, GERD, hypothyroidism, and asthma presents from [place] to IR suite to have her nephrostomy tube exchanged and she was found to be hypotensive, febrile, tachycardic and less responsive than normal. Interventional radiology department initiated sepsis protocol.  Patient was given IV antibiotics, IV fluids and lab work was ordered and sent to the lab.  Patient was then transported to the emergency department for evaluation. She appeared to have a UTI, for which she has a history of ESBL Klebsiella UTIs in past.    5/19 - Per daughter, she is more herself today. More alert and interactive. Non-verbal.    5/20-patient noncommunicative today and clearly confused and is a poor historian    5/21-patient much more alert and oriented today communicative patient denies fever chills nausea vomiting chest pain or shortness of breath discussed patient's need to increase her oral intake for nutrition and free water        Assessment/Plan (MDM):     Active Medical Complaints and Diagnoses:    Principal Problem:    Severe sepsis ([place]) (01/10/2020)  - In ER WBC 38.3 + HR 118 + RR 31 + UTI + worsened confusion  - Improving  - Continue Meropenem based on previous cultures  - UA consistent with UTI  - Urine culture pending  - Blood cultures pending  - CXR unremarkable   - Lactic down to 2.3  5/20-clinically improving and leukocytosis is downtrending.  Continue meropenem based on prior cultures and await cultures and sensitivities.  Blood cultures negative to date  5/21-continues to clinically improve.  Continue broad-spectrum antibiotics and monitor.    Active Problems:    Urinary tract  infection due to ESBL Klebsiella (04/03/2019)  - UA consistent with UTI  - Continue Meropenem based on previous cultures  - Urine culture pending  5/20-continue meropenem based on prior cultures and await cultures and sensitivities.  5/21-cultures and sensitivities pending.  Continue meropenem based on prior cultures.      AKI (acute kidney injury) ([place]) (04/02/2019)  - Resolved  - Continue IVFs  5/20-resolved    Hypernatremia  5/20-we will start D5W at 100 cc/h and clinically monitor patient's BMP every 8 hours.  5/21-resolved will discontinue D5W.  Spoke with patient regarding importance of increasing her oral intake of nutrition and free water as this is likely attributing to the patient's hyponatremia.      Lactic acidosis (04/03/2019)  - Due to #1  - Continue IVFs  - Continue Meropenem  - Continue checks until <2.0  5/20-resolved      Essential hypertension (08/29/2015)  - Stable      Frontotemporal dementia ([place]) (02/01/2015)  - Patient not eating/drinking much  - Discussed with daughter and plan is to return [facility]    Diet: DIET PUREED  DIET NUTRITIONAL SUPPLEMENTS All Meals; Ensure High Protein ( )  DIET NUTRITIONAL SUPPLEMENTS All Meals; Magic Cups ( )  VTE ppx: Heparin    Objective:     Visit Vitals  BP 116/75 (BP 1 Location: Right upper arm, BP Patient  Position: At rest)   Pulse 89   Temp 97.5 ??F (36.4 ??C)   Resp 16   Ht 5\' 2"  (1.575 m)   Wt 40.8 kg (90 lb)   LMP  (LMP Unknown)   SpO2 95%   BMI 16.46 kg/m??      Oxygen Therapy  O2 Sat (%): 95 % (01/13/20 1537)  Pulse via Oximetry: 88 beats per minute (01/10/20 1544)  O2 Device: None (Room air) (01/12/20 2023)      Intake and Output:     Intake/Output Summary (Last 24 hours) at 01/13/2020 1555  Last data filed at 01/13/2020 1537  Gross per 24 hour   Intake 119 ml   Output 2100 ml   Net -1981 ml         Physical Exam:   GENERAL: alert, cooperative, no distress, appears stated age  EYE: conjunctivae/corneas clear. PERRL.  THROAT & NECK: normal and no  erythema or exudates noted.   LUNG: clear to auscultation bilaterally  HEART: regular rate and rhythm, S1S2, no murmur, no JVD  ABDOMEN: soft, non-tender, non-distended. Bowel sounds normal.  EXTREMITIES:  No edema, 2+ pedal/radial pulses bilaterally  SKIN: no rash or abnormalities  NEUROLOGIC: Alert, non-verbal. Cranial nerves 2-12 grossly intact.    Lab/Data Review:  Recent Results (from the past 24 hour(s))   METABOLIC PANEL, BASIC    Collection Time: 01/12/20  6:56 PM   Result Value Ref Range    Sodium 131 (L) 136 - 145 mmol/L    Potassium 3.1 (L) 3.5 - 5.1 mmol/L    Chloride 101 98 - 107 mmol/L    CO2 22 21 - 32 mmol/L    Anion gap 8 7 - 16 mmol/L    Glucose 555 (HH) 65 - 100 mg/dL    BUN 24 (H) 8 - 23 MG/DL    Creatinine 0.61 0.6 - 1.0 MG/DL    GFR est AA >60 >60 ml/min/1.87m2    GFR est non-AA >60 >60 ml/min/1.53m2    Calcium 6.6 (L) 8.3 - 10.4 MG/DL   GLUCOSE, POC    Collection Time: 01/12/20  7:39 PM   Result Value Ref Range    Glucose (POC) 105 (H) 65 - 100 mg/dL    Performed by McGuireKathrynBSN    METABOLIC PANEL, BASIC    Collection Time: 01/12/20 10:48 PM   Result Value Ref Range    Sodium 145 136 - 145 mmol/L    Potassium 3.4 (L) 3.5 - 5.1 mmol/L    Chloride 114 (H) 98 - 107 mmol/L    CO2 26 21 - 32 mmol/L    Anion gap 5 (L) 7 - 16 mmol/L    Glucose 70 65 - 100 mg/dL    BUN 25 (H) 8 - 23 MG/DL    Creatinine 0.44 (L) 0.6 - 1.0 MG/DL    GFR est AA >60 >60 ml/min/1.29m2    GFR est non-AA >60 >60 ml/min/1.42m2    Calcium 7.4 (L) 8.3 - 10.4 MG/DL   CBC WITH AUTOMATED DIFF    Collection Time: 01/13/20  5:11 AM   Result Value Ref Range    WBC 9.4 4.3 - 11.1 K/uL    RBC 3.63 (L) 4.05 - 5.2 M/uL    HGB 11.1 (L) 11.7 - 15.4 g/dL    HCT 36.3 35.8 - 46.3 %    MCV 100.0 (H) 79.6 - 97.8 FL    MCH 30.6 26.1 - 32.9 PG    MCHC 30.6 (L) 31.4 -  35.0 g/dL    RDW 14.7 (H) 11.9 - 14.6 %    PLATELET 118 (L) 150 - 450 K/uL    MPV 9.9 9.4 - 12.3 FL    ABSOLUTE NRBC 0.02 0.0 - 0.2 K/uL    DF AUTOMATED      NEUTROPHILS 65 43 -  78 %    LYMPHOCYTES 22 13 - 44 %    MONOCYTES 9 4.0 - 12.0 %    EOSINOPHILS 1 0.5 - 7.8 %    BASOPHILS 0 0.0 - 2.0 %    IMMATURE GRANULOCYTES 2 0.0 - 5.0 %    ABS. NEUTROPHILS 6.1 1.7 - 8.2 K/UL    ABS. LYMPHOCYTES 2.1 0.5 - 4.6 K/UL    ABS. MONOCYTES 0.8 0.1 - 1.3 K/UL    ABS. EOSINOPHILS 0.1 0.0 - 0.8 K/UL    ABS. BASOPHILS 0.0 0.0 - 0.2 K/UL    ABS. IMM. GRANS. 0.2 0.0 - 0.5 K/UL   METABOLIC PANEL, BASIC    Collection Time: 01/13/20  5:11 AM   Result Value Ref Range    Sodium 142 136 - 145 mmol/L    Potassium 3.4 (L) 3.5 - 5.1 mmol/L    Chloride 110 (H) 98 - 107 mmol/L    CO2 26 21 - 32 mmol/L    Anion gap 6 (L) 7 - 16 mmol/L    Glucose 77 65 - 100 mg/dL    BUN 22 8 - 23 MG/DL    Creatinine 0.43 (L) 0.6 - 1.0 MG/DL    GFR est AA >60 >60 ml/min/1.10m2    GFR est non-AA >60 >60 ml/min/1.32m2    Calcium 7.4 (L) 8.3 - 10.4 MG/DL       SARS-CoV-2 Lab Results  "Novel Coronavirus" Test: No results found for: COV2NT   "Emergent Disease" Test: No results found for: EDPR  "SARS-COV-2" Test: No results found for: XGCOVT  "Precision Labs" Test: No results found for: RSLT  Rapid Test:   Lab Results   Component Value Date/Time    COVR Not detected 05/16/2019 09:52 AM           Imaging:  XR CHEST PORT    Result Date: 01/10/2020  EXAM: XR CHEST PORT INDICATION: fever COMPARISON: Chest radiograph, 05/09/2019 FINDINGS: The cardiomediastinal silhouette is within normal limits. Atherosclerotic calcifications in the aorta. Upper lobe predominant emphysema. No superimposed consolidation. No pleural effusion. No pneumothorax. No acute osseous abnormality.     Emphysema without acute superimposed process.     IR EXCHANGE NEPHRO PERC RT SI    Result Date: 01/03/2020  Indication: Ureteral obstruction. The patient is bedbound. The patient's family requests that we remove the nephrostomy catheter in place and antegrade ureteral stent. Consent: Informed written and oral consent was obtained from the patient's family. Procedure:  Maximal sterile  barrier technique (including:  cap, mask, sterile gown, sterile gloves, sterile sheet, hand hygiene, and chlorhexidene for cutaneous antisepsis) were used.  With the patient prone the skin around the nephroureteral drain  was prepped and draped in the standard fashion. Lidocaine was administered for local field block. Using fluoroscopic guidance, initial attempts at removing the indwelling nephroureteral catheter is coiled resistance. After numerous catheters and sheaths, the existing nephroureteral catheter was removed. It is heavily encrusted in the distal pigtail. After establishing access back into the existing nephrostomy tract, nephrostogram was performed. The images demonstrate extensive debris and a tortuous ureter. Using a wire and catheter, access was initially drained across the anastomosis, through the ileal  conduit and into the ostomy bag. Once through and through access was obtained, a 45 cm 10 French pigtail catheter was advanced in a retrograde fashion. The pigtail was formed in the renal pelvis. Also, a new. 10 French nephrostomy was positioned in the renal pelvis..  Contrast was administered confirming appropriate position. The catheter was secured with a suture. A drainage bag was attached. Complications: None. Radiation Exposure Indices: Reference Air Kerma (Ka,r) = 109 mGy Dose Area Product/Kerma Area Product (DAP/KAP/PKA) = [id number] cGy-cm2 Fluoroscopy Exposure Time = 22 minutes 6 seconds Contrast:  20 milliliters. Medications: None. Findings: Moderate hydronephrosis. Debris in the renal pelvis. A heavily encrusted existing catheter..     1. Extensive difficulty in removing the existing nephroureteral catheter. 2. A new pigtail ureteral stent was placed in a retrograde fashion with the pigtail in the renal pelvis and the catheter in the ostomy bag. 3. A nephrostomy tube was left in position.. Plan: The patient will recover for approximately 1 hour prior to discharge home. Recommend external  drainage for one day. Didn't recommend capping the nephrostomy tube. The patient will return in one week for repeat nephrostogram and hopefully nephrostomy tube removal..     No results found for this visit on 01/10/20.    Cultures:  All Micro Results     Procedure Component Value Units Date/Time    CULTURE, URINE [id number]  (Abnormal) Collected: 01/10/20 0920    Order Status: Completed Specimen: Cath Urine Updated: 01/13/20 0906     Special Requests: NO SPECIAL REQUESTS        Culture result:       >100,000 COLONIES/mL GRAM NEGATIVE RODS SUBCULTURE IN PROGRESS                  CULTURE IN PROGRESS,FURTHER UPDATES TO FOLLOW          CULTURE, BLOOD [id number]     Order Status: Canceled Specimen: Blood           Assessment:     Primary Diagnosis: Severe sepsis [facility])    Hospital Problems as of 01/13/2020 Date Reviewed: June 07, 2018        Codes Class Noted - Resolved POA    * (Principal) Severe sepsis ([place]) ICD-10-CM: A41.9, R65.20  ICD-9-CM: 038.9, 995.92  01/10/2020 - Present Yes        Urinary tract infection due to ESBL Klebsiella ICD-10-CM: N39.0, B96.89  ICD-9-CM: 599.0, 041.84  04/03/2019 - Present Yes        Lactic acidosis ICD-10-CM: E87.2  ICD-9-CM: 276.2  04/03/2019 - Present Yes        Hypoalbuminemia ICD-10-CM: E88.09  ICD-9-CM: 273.8  04/03/2019 - Present Yes        AKI (acute kidney injury) ([place]) ICD-10-CM: N17.9  ICD-9-CM: 584.9  04/02/2019 - Present Yes        Acquired hypothyroidism ICD-10-CM: E03.9  ICD-9-CM: 244.9  08/29/2015 - Present Yes        Essential hypertension ICD-10-CM: I10  ICD-9-CM: 401.9  08/29/2015 - Present Yes        Mixed hyperlipidemia ICD-10-CM: E78.2  ICD-9-CM: 272.2  08/29/2015 - Present Yes        Frontotemporal dementia ([place]) ICD-10-CM: G31.09, F02.80  ICD-9-CM: 331.19  02/01/2015 - Present Yes    Overview Signed 10/20/2017 10:25 AM by Wilfred Lacy, NP     Last Assessment & Plan:   Disinhibited, agitated, aggressive, violent at times. Will add depakote to try to calm her behaviors down. Might  need to adjust  namenda, aricept, and paxil, too but will start with depakote. Provided information about the drug, anticipated effects, and possible side effects. Family will call me next week and let me know how she is doing, we may need to increase the dose, but since she is small, we will need to monitor for fall risk.     Daughter inquired about PACE program. She isnt medicaid eligible, I dont think, but out of pocket might be an option for daytime care. She is also going to look into senior day programs. I will refer her daughter to the [employer] for additional resources. Might need to send daughter REACH at next appt, too.                      Discharge Plan:     To [place] with hospice once urine culture resulted    Signed By: Estevan Oaks, DO     Jan 13, 2020

## 2020-01-14 LAB — METABOLIC PANEL, BASIC
Anion gap: 5 mmol/L — ABNORMAL LOW (ref 7–16)
BUN: 14 MG/DL (ref 8–23)
CO2: 27 mmol/L (ref 21–32)
Calcium: 8 MG/DL — ABNORMAL LOW (ref 8.3–10.4)
Chloride: 107 mmol/L (ref 98–107)
Creatinine: 0.53 MG/DL — ABNORMAL LOW (ref 0.6–1.0)
GFR est AA: >60 mL/min/{1.73_m2} (ref >60)
GFR est non-AA: >60 mL/min/{1.73_m2} (ref >60)
Glucose: 89 mg/dL (ref 65–100)
Potassium: 5 mmol/L (ref 3.5–5.1)
Sodium: 139 mmol/L (ref 136–145)

## 2020-01-14 LAB — SARS-COV-2

## 2020-01-14 LAB — COVID-19 RAPID TEST: COVID-19 rapid test: NOT DETECTED

## 2020-01-14 MED ORDER — SODIUM ZIRCONIUM CYCLOSILICATE 5 GRAM ORAL POWDER PACKET
5 gram | ORAL | Status: AC
Start: 2020-01-14 — End: 2020-01-14
  Administered 2020-01-14: 19:00:00 via ORAL

## 2020-01-14 MED FILL — ATORVASTATIN 20 MG TAB: 20 mg | ORAL | Qty: 1

## 2020-01-14 MED FILL — LOKELMA 5 GRAM ORAL POWDER PACKET: 5 gram | ORAL | Qty: 1

## 2020-01-14 MED FILL — HEPARIN (PORCINE) 5,000 UNIT/ML IJ SOLN: 5000 unit/mL | INTRAMUSCULAR | Qty: 1

## 2020-01-14 MED FILL — MEROPENEM 500 MG IV SOLR: 500 mg | INTRAVENOUS | Qty: 500

## 2020-01-14 MED FILL — SODIUM CHLORIDE 0.9 % IV PIGGY BACK: INTRAVENOUS | Qty: 50

## 2020-01-14 MED FILL — PAROXETINE 20 MG TAB: 20 mg | ORAL | Qty: 1

## 2020-01-14 NOTE — Progress Notes (Signed)
Problem: Risk for Spread of Infection  Goal: Prevent transmission of infectious organism to others  Description: Prevent the transmission of infectious organisms to other patients, staff members, and visitors.  Outcome: Progressing Towards Goal     Problem: Pressure Injury - Risk of  Goal: *Prevention of pressure injury  Description: Document Braden Scale and appropriate interventions in the flowsheet.  Outcome: Progressing Towards Goal  Note: Pressure Injury Interventions:  Sensory Interventions: Assess changes in LOC, Check visual cues for pain, Float heels, Keep linens dry and wrinkle-free, Maintain/enhance activity level, Minimize linen layers    Moisture Interventions: Absorbent underpads, Apply protective barrier, creams and emollients, Check for incontinence Q2 hours and as needed, Internal/External urinary devices    Activity Interventions: Assess need for specialty bed    Mobility Interventions: Assess need for specialty bed, HOB 30 degrees or less, Pressure redistribution bed/mattress (bed type), Turn and reposition approx. every two hours(pillow and wedges)    Nutrition Interventions: Offer support with meals,snacks and hydration    Friction and Shear Interventions: Apply protective barrier, creams and emollients, HOB 30 degrees or less, Lift sheet, Lift team/patient mobility team

## 2020-01-14 NOTE — Progress Notes (Signed)
Problem: Risk for Spread of Infection  Goal: Prevent transmission of infectious organism to others  Description: Prevent the transmission of infectious organisms to other patients, staff members, and visitors.  Outcome: Progressing Towards Goal     Problem: Patient Education:  Go to Education Activity  Goal: Patient/Family Education  Outcome: Progressing Towards Goal     Problem: Pressure Injury - Risk of  Goal: *Prevention of pressure injury  Description: Document Braden Scale and appropriate interventions in the flowsheet.  Outcome: Progressing Towards Goal  Note: Pressure Injury Interventions:  Sensory Interventions: Assess changes in LOC, Check visual cues for pain, Float heels, Keep linens dry and wrinkle-free, Maintain/enhance activity level, Minimize linen layers    Moisture Interventions: Absorbent underpads, Apply protective barrier, creams and emollients, Check for incontinence Q2 hours and as needed, Internal/External urinary devices    Activity Interventions: Assess need for specialty bed    Mobility Interventions: Assess need for specialty bed, HOB 30 degrees or less, Pressure redistribution bed/mattress (bed type), Turn and reposition approx. every two hours(pillow and wedges)    Nutrition Interventions: Offer support with meals,snacks and hydration    Friction and Shear Interventions: Apply protective barrier, creams and emollients, HOB 30 degrees or less, Lift sheet, Lift team/patient mobility team                Problem: Patient Education: Go to Patient Education Activity  Goal: Patient/Family Education  Outcome: Progressing Towards Goal     Problem: Falls - Risk of  Goal: *Absence of Falls  Description: Document Cathy Jackson Fall Risk and appropriate interventions in the flowsheet.  Outcome: Progressing Towards Goal  Note: Fall Risk Interventions:       Mentation Interventions: Adequate sleep, hydration, pain control         Elimination Interventions: Bed/chair exit alarm, Call light in reach               Problem: Patient Education: Go to Patient Education Activity  Goal: Patient/Family Education  Outcome: Progressing Towards Goal

## 2020-01-14 NOTE — Progress Notes (Incomplete)
Hourly rounding completed on pt. Pt denies any needs at this time.  Will give handoff shift report to oncoming nurse.

## 2020-01-14 NOTE — Progress Notes (Signed)
NOTED:    Non verbal    Dementia    DNR    No ACP on file      Brief hello from the door.  Patient looked at chaplain  She is non verbal      Will continue to assess how to best serve this family

## 2020-01-14 NOTE — Progress Notes (Signed)
[facility] Hospitalist Progress Note    Patient: Cathy Jackson MRN: [id number]  SSN: [id number]    Date of Birth: 07-22-1946  Age: 74 y.o.  Sex: female      Admit Date: 01/10/2020    LOS: 4 days     Subjective:     Chief Complaint:   Reason for Admission: Severe sepsis ([place])    74 yo CF with past history of dementia, chronic nephrostomy tubes, GERD, hypothyroidism, and asthma presents from [place] to IR suite to have her nephrostomy tube exchanged and she was found to be hypotensive, febrile, tachycardic and less responsive than normal. Interventional radiology department initiated sepsis protocol.  Patient was given IV antibiotics, IV fluids and lab work was ordered and sent to the lab.  Patient was then transported to the emergency department for evaluation. She appeared to have a UTI, for which she has a history of ESBL Klebsiella UTIs in past.    5/19 - Per daughter, she is more herself today. More alert and interactive. Non-verbal.    5/20-patient noncommunicative today and clearly confused and is a poor historian    5/21-patient much more alert and oriented today communicative patient denies fever chills nausea vomiting chest pain or shortness of breath discussed patient's need to increase her oral intake for nutrition and free water        Assessment/Plan (MDM):     Active Medical Complaints and Diagnoses:    Principal Problem:    Severe sepsis ([place]) (01/10/2020)  - In ER WBC 38.3 + HR 118 + RR 31 + UTI + worsened confusion  - Improving  - Continue Meropenem based on previous cultures  - UA consistent with UTI  - Urine culture pending  - Blood cultures pending  - CXR unremarkable   - Lactic down to 2.3  5/20-clinically improving and leukocytosis is downtrending.  Continue meropenem based on prior cultures and await cultures and sensitivities.  Blood cultures negative to date  5/21-continues to clinically improve.  Continue broad-spectrum antibiotics and monitor.  5/22-resolved.    Active Problems:     Urinary tract infection due to ESBL Klebsiella (04/03/2019)  - UA consistent with UTI  - Continue Meropenem based on previous cultures  - Urine culture pending  5/20-continue meropenem based on prior cultures and await cultures and sensitivities.  5/21-cultures and sensitivities pending.  Continue meropenem based on prior cultures.  5/22-continue meropenem and await urine cultures and sensitivities.      AKI (acute kidney injury) ([place]) (04/02/2019)  - Resolved  - Continue IVFs  5/20-resolved    Hypernatremia  5/20-we will start D5W at 100 cc/h and clinically monitor patient's BMP every 8 hours.  5/21-resolved will discontinue D5W.  Spoke with patient regarding importance of increasing her oral intake of nutrition and free water as this is likely attributing to the patient's hyponatremia.    Severe protein calorie malnutrition  5/22-patient daughter reports patient has had continued decreased oral intake for months.  Patient continues to have an adequate oral intake of both free water and nutrition.  We will consult palliative care.  I have encouraged the patient to increase her oral intake and she has been unable to do so.  Without increase in the patient's nutritional status patient will have a extremely poor outcome.      Lactic acidosis (04/03/2019)  - Due to #1  - Continue IVFs  - Continue Meropenem  - Continue checks until <2.0  5/20-resolved      Essential  hypertension (08/29/2015)  - Stable      Frontotemporal dementia ([place]) (02/01/2015)  - Patient not eating/drinking much  - Discussed with daughter and plan is to return to [facility] with hospice    Diet: DIET PUREED  DIET NUTRITIONAL SUPPLEMENTS All Meals; Magic Cups ( )  DIET NUTRITIONAL SUPPLEMENTS All Meals; Ensure Enlive ( )  VTE ppx: Heparin    Objective:     Visit Vitals  BP (!) 89/45 (BP 1 Location: Right arm, BP Patient Position: At rest)   Pulse (!) 104   Temp 98.1 ??F (36.7 ??C)   Resp 18   Ht 5\' 2"  (1.575 m)   Wt 40.8 kg (90 lb)   LMP  (LMP Unknown)   SpO2 96%    BMI 16.46 kg/m??      Oxygen Therapy  O2 Sat (%): 96 % (01/14/20 1235)  Pulse via Oximetry: 88 beats per minute (01/10/20 1544)  O2 Device: None (Room air) (01/12/20 2023)      Intake and Output:     Intake/Output Summary (Last 24 hours) at 01/14/2020 1339  Last data filed at 01/14/2020 0600  Gross per 24 hour   Intake 30 ml   Output 1075 ml   Net -1045 ml         Physical Exam:   GENERAL: alert, cooperative, no distress, appears stated age  EYE: conjunctivae/corneas clear. PERRL.  THROAT & NECK: normal and no erythema or exudates noted.   LUNG: clear to auscultation bilaterally  HEART: regular rate and rhythm, S1S2, no murmur, no JVD  ABDOMEN: soft, non-tender, non-distended. Bowel sounds normal.  EXTREMITIES:  No edema, 2+ pedal/radial pulses bilaterally  SKIN: no rash or abnormalities  NEUROLOGIC: Alert, non-verbal. Cranial nerves 2-12 grossly intact.    Lab/Data Review:  Recent Results (from the past 24 hour(s))   METABOLIC PANEL, BASIC    Collection Time: 01/14/20  9:22 AM   Result Value Ref Range    Sodium 139 136 - 145 mmol/L    Potassium 5.0 3.5 - 5.1 mmol/L    Chloride 107 98 - 107 mmol/L    CO2 27 21 - 32 mmol/L    Anion gap 5 (L) 7 - 16 mmol/L    Glucose 89 65 - 100 mg/dL    BUN 14 8 - 23 MG/DL    Creatinine 0.53 (L) 0.6 - 1.0 MG/DL    GFR est AA >60 >60 ml/min/1.20m2    GFR est non-AA >60 >60 ml/min/1.57m2    Calcium 8.0 (L) 8.3 - 10.4 MG/DL   SARS-COV-2    Collection Time: 01/14/20 10:24 AM   Result Value Ref Range    SARS-CoV-2 Please find results under separate order     COVID-19 RAPID TEST    Collection Time: 01/14/20 10:24 AM   Result Value Ref Range    Specimen source NASAL      COVID-19 rapid test Not detected NOTD         SARS-CoV-2 Lab Results  "Novel Coronavirus" Test: No results found for: COV2NT   "Emergent Disease" Test: No results found for: EDPR  "SARS-COV-2" Test: No results found for: XGCOVT  "Precision Labs" Test: No results found for: RSLT  Rapid Test:   Lab Results   Component Value  Date/Time    COVR Not detected 01/14/2020 10:24 AM           Imaging:  XR CHEST PORT    Result Date: 01/10/2020  EXAM: XR CHEST PORT INDICATION: fever COMPARISON: Chest  radiograph, 05/09/2019 FINDINGS: The cardiomediastinal silhouette is within normal limits. Atherosclerotic calcifications in the aorta. Upper lobe predominant emphysema. No superimposed consolidation. No pleural effusion. No pneumothorax. No acute osseous abnormality.     Emphysema without acute superimposed process.     IR EXCHANGE NEPHRO PERC RT SI    Result Date: 01/03/2020  Indication: Ureteral obstruction. The patient is bedbound. The patient's family requests that we remove the nephrostomy catheter in place and antegrade ureteral stent. Consent: Informed written and oral consent was obtained from the patient's family. Procedure:  Maximal sterile barrier technique (including:  cap, mask, sterile gown, sterile gloves, sterile sheet, hand hygiene, and chlorhexidene for cutaneous antisepsis) were used.  With the patient prone the skin around the nephroureteral drain  was prepped and draped in the standard fashion. Lidocaine was administered for local field block. Using fluoroscopic guidance, initial attempts at removing the indwelling nephroureteral catheter is coiled resistance. After numerous catheters and sheaths, the existing nephroureteral catheter was removed. It is heavily encrusted in the distal pigtail. After establishing access back into the existing nephrostomy tract, nephrostogram was performed. The images demonstrate extensive debris and a tortuous ureter. Using a wire and catheter, access was initially drained across the anastomosis, through the ileal conduit and into the ostomy bag. Once through and through access was obtained, a 45 cm 10 French pigtail catheter was advanced in a retrograde fashion. The pigtail was formed in the renal pelvis. Also, a new. 10 French nephrostomy was positioned in the renal pelvis..  Contrast was  administered confirming appropriate position. The catheter was secured with a suture. A drainage bag was attached. Complications: None. Radiation Exposure Indices: Reference Air Kerma (Ka,r) = 109 mGy Dose Area Product/Kerma Area Product (DAP/KAP/PKA) = [id number] cGy-cm2 Fluoroscopy Exposure Time = 22 minutes 6 seconds Contrast:  20 milliliters. Medications: None. Findings: Moderate hydronephrosis. Debris in the renal pelvis. A heavily encrusted existing catheter..     1. Extensive difficulty in removing the existing nephroureteral catheter. 2. A new pigtail ureteral stent was placed in a retrograde fashion with the pigtail in the renal pelvis and the catheter in the ostomy bag. 3. A nephrostomy tube was left in position.. Plan: The patient will recover for approximately 1 hour prior to discharge home. Recommend external drainage for one day. Didn't recommend capping the nephrostomy tube. The patient will return in one week for repeat nephrostogram and hopefully nephrostomy tube removal..     No results found for this visit on 01/10/20.    Cultures:  All Micro Results     Procedure Component Value Units Date/Time    SARS-COV-2, PCR [id number] Collected: 01/14/20 1024    Order Status: Completed Updated: 01/14/20 1130    COVID-19 RAPID TEST [id number] Collected: 01/14/20 1024    Order Status: Completed Specimen: Nasopharyngeal Updated: 01/14/20 1128     Specimen source NASAL        COVID-19 rapid test Not detected        Comment:      The specimen is NEGATIVE for SARS-CoV-2, the novel coronavirus associated with COVID-19.  A negative result does not rule out COVID-19.       This test has been authorized by the FDA under an Emergency Use Authorization (EUA) for use by authorized laboratories.        Fact sheet for Healthcare Providers: LittleDVDs.dk  Fact sheet for Patients: SatelliteRebate.it       Methodology: Isothermal Nucleic Acid Amplification         CULTURE, URINE  [  [id number]  (Abnormal) Collected: 01/10/20 0920    Order Status: Completed Specimen: Cath Urine Updated: 01/14/20 1032     Special Requests: NO SPECIAL REQUESTS        Culture result:       >100,000 COLONIES/mL GRAM NEGATIVE RODS IDENTIFICATION AND SUSCEPTIBILITY TO FOLLOW                  CULTURE IN PROGRESS,FURTHER UPDATES TO FOLLOW          CULTURE, BLOOD [id number]     Order Status: Canceled Specimen: Blood           Assessment:     Primary Diagnosis: Severe sepsis [facility])    Hospital Problems as of 01/14/2020 Date Reviewed: 2018/06/12        Codes Class Noted - Resolved POA    * (Principal) Severe sepsis ([place]) ICD-10-CM: A41.9, R65.20  ICD-9-CM: 038.9, 995.92  01/10/2020 - Present Yes        Urinary tract infection due to ESBL Klebsiella ICD-10-CM: N39.0, B96.89  ICD-9-CM: 599.0, 041.84  04/03/2019 - Present Yes        Lactic acidosis ICD-10-CM: E87.2  ICD-9-CM: 276.2  04/03/2019 - Present Yes        Hypoalbuminemia ICD-10-CM: E88.09  ICD-9-CM: 273.8  04/03/2019 - Present Yes        AKI (acute kidney injury) ([place]) ICD-10-CM: N17.9  ICD-9-CM: 584.9  04/02/2019 - Present Yes        Acquired hypothyroidism ICD-10-CM: E03.9  ICD-9-CM: 244.9  08/29/2015 - Present Yes        Essential hypertension ICD-10-CM: I10  ICD-9-CM: 401.9  08/29/2015 - Present Yes        Mixed hyperlipidemia ICD-10-CM: E78.2  ICD-9-CM: 272.2  08/29/2015 - Present Yes        Frontotemporal dementia ([place]) ICD-10-CM: G31.09, F02.80  ICD-9-CM: 331.19  02/01/2015 - Present Yes    Overview Signed 10/20/2017 10:25 AM by Wilfred Lacy, NP     Last Assessment & Plan:   Disinhibited, agitated, aggressive, violent at times. Will add depakote to try to calm her behaviors down. Might need to adjust namenda, aricept, and paxil, too but will start with depakote. Provided information about the drug, anticipated effects, and possible side effects. Family will call me next week and let me know how she is doing, we may need to increase the dose, but since she is small, we will  need to monitor for fall risk.     Daughter inquired about PACE program. She isnt medicaid eligible, I dont think, but out of pocket might be an option for daytime care. She is also going to look into senior day programs. I will refer her daughter to the [employer] for additional resources. Might need to send daughter REACH at next appt, too.                      Discharge Plan:     To [place] with hospice once urine culture resulted    Signed By: Estevan Oaks, DO     Jan 14, 2020

## 2020-01-14 NOTE — Other (Signed)
Bedside and Verbal shift change report received from [place], [place]. Report included the following information SBAR, Kardex, Intake/Output, MAR and Recent Results.

## 2020-01-14 NOTE — Progress Notes (Signed)
Problem: Pressure Injury - Risk of  Goal: *Prevention of pressure injury  Description: Document Braden Scale and appropriate interventions in the flowsheet.  Outcome: Progressing Towards Goal  Note: Pressure Injury Interventions:  Sensory Interventions: Assess changes in LOC, Keep linens dry and wrinkle-free, Maintain/enhance activity level, Minimize linen layers, Float heels    Moisture Interventions: Absorbent underpads, Apply protective barrier, creams and emollients, Internal/External urinary devices, Limit adult briefs, Minimize layers    Activity Interventions: Increase time out of bed, Pressure redistribution bed/mattress(bed type)    Mobility Interventions: HOB 30 degrees or less, Pressure redistribution bed/mattress (bed type)    Nutrition Interventions: Document food/fluid/supplement intake, Offer support with meals,snacks and hydration    Friction and Shear Interventions: Foam dressings/transparent film/skin sealants, HOB 30 degrees or less, Apply protective barrier, creams and emollients, Minimize layers

## 2020-01-15 LAB — CULTURE, BLOOD
Culture result:: NO GROWTH
Culture result:: NO GROWTH

## 2020-01-15 LAB — CULTURE, URINE: Culture result:: >100000 — AB

## 2020-01-15 LAB — SARS-COV-2, PCR: SARS-CoV-2 by PCR: NOT DETECTED

## 2020-01-15 MED ORDER — SODIUM CHLORIDE 0.9 % IV PIGGY BACK
1 gram | INTRAVENOUS | Status: DC
Start: 2020-01-15 — End: 2020-01-16
  Administered 2020-01-15: 18:00:00 via INTRAVENOUS

## 2020-01-15 MED FILL — HEPARIN (PORCINE) 5,000 UNIT/ML IJ SOLN: 5000 unit/mL | INTRAMUSCULAR | Qty: 1

## 2020-01-15 MED FILL — CEFTRIAXONE 1 GRAM SOLUTION FOR INJECTION: 1 gram | INTRAMUSCULAR | Qty: 1

## 2020-01-15 MED FILL — PAROXETINE 20 MG TAB: 20 mg | ORAL | Qty: 1

## 2020-01-15 MED FILL — MEROPENEM 500 MG IV SOLR: 500 mg | INTRAVENOUS | Qty: 500

## 2020-01-15 MED FILL — ATORVASTATIN 20 MG TAB: 20 mg | ORAL | Qty: 1

## 2020-01-15 NOTE — Progress Notes (Signed)
Hospitalist Progress Note     Admit Date:  01/10/2020  9:59 AM   Name:  Cathy Jackson   Age:  74 y.o.  DOB:  29-Aug-1945   MRN:  [id number]   PCP:  Wilfred Lacy, NP  Presenting Complaint: Blood infection    Initial Admission Diagnosis: Severe sepsis ([place]) [A41.9, R65.20]     Assessment and Plan:     Hospital Problems as of 01/15/2020 Date Reviewed: Jun 02, 2018        Codes Class Noted - Resolved POA    * (Principal) Severe sepsis ([place]) ICD-10-CM: A41.9, R65.20  ICD-9-CM: 038.9, 995.92  01/10/2020 - Present Yes        Urinary tract infection due to ESBL Klebsiella ICD-10-CM: N39.0, B96.89  ICD-9-CM: 599.0, 041.84  04/03/2019 - Present Yes        Lactic acidosis ICD-10-CM: E87.2  ICD-9-CM: 276.2  04/03/2019 - Present Yes        Hypoalbuminemia ICD-10-CM: E88.09  ICD-9-CM: 273.8  04/03/2019 - Present Yes        AKI (acute kidney injury) ([place]) ICD-10-CM: N17.9  ICD-9-CM: 584.9  04/02/2019 - Present Yes        Acquired hypothyroidism ICD-10-CM: E03.9  ICD-9-CM: 244.9  08/29/2015 - Present Yes        Essential hypertension ICD-10-CM: I10  ICD-9-CM: 401.9  08/29/2015 - Present Yes        Mixed hyperlipidemia ICD-10-CM: E78.2  ICD-9-CM: 272.2  08/29/2015 - Present Yes        Frontotemporal dementia ([place]) ICD-10-CM: G31.09, F02.80  ICD-9-CM: 331.19  02/01/2015 - Present Yes    Overview Signed 10/20/2017 10:25 AM by Wilfred Lacy, NP     Last Assessment & Plan:   Disinhibited, agitated, aggressive, violent at times. Will add depakote to try to calm her behaviors down. Might need to adjust namenda, aricept, and paxil, too but will start with depakote. Provided information about the drug, anticipated effects, and possible side effects. Family will call me next week and let me know how she is doing, we may need to increase the dose, but since she is small, we will need to monitor for fall risk.     Daughter inquired about PACE program. She isnt medicaid eligible, I dont think, but out of pocket might be an option for daytime care. She is also  going to look into senior day programs. I will refer her daughter to the [employer] for additional resources. Might need to send daughter REACH at next appt, too.                    Plan:  # Severe sepsis 2/2 UTI   - Urine cx with pan-sensitive klebsiella so change meropenem to Rocephin x2 more doses. Blood cxs NTD.    # HypoK   - Resolved.    # HyperNa   - Resolved.    # Severe PCM   - Poor PO intake noted by prior MD and PC consulted.    # Dementia    Other listed chronic conditions stable, continue current management.    Discharge planning: Back to facility.   Diet:  DIET PUREED  DIET NUTRITIONAL SUPPLEMENTS  DIET NUTRITIONAL SUPPLEMENTS  DVT ppx: SCDs    Hospital Course:   Ms. Laurain is a 74 y/o WF with a h/o chronic nephrostomy tube, GERD, dementia, hypothyroidism and NHC who presented to IR on 5/18 for nephrostomy exchange but was febrile and tachycardic. Sent to the ER.  She has a h/o ESBL UTIs. WBCs 28K, LA 5.6, urine with pyuria and bacteriuria. She was admitted and started on empiric meropenem given prior history of ESBL UTIs.    24hr Events/Subjective:   5/23: Urine cx pan-sens kleb pna. She is in bed, says she's "hanging in there". Afebrile. Denies N/V/D, abdo pain, chest pain or SOB. No family present. ROS neg otherwise.     Objective:     Patient Vitals for the past 24 hrs:   Temp Pulse Resp BP SpO2   01/15/20 0818 97.6 ??F (36.4 ??C) 80 18 (!) 108/55 96 %   01/15/20 0511 97.8 ??F (36.6 ??C) 84 20 103/69 94 %   01/14/20 2318 97.7 ??F (36.5 ??C) 87 19 (!) 92/57 95 %   01/14/20 2040 98.4 ??F (36.9 ??C) 95 17 99/61 96 %   01/14/20 1550 98.1 ??F (36.7 ??C) (!) 106 18 (!) 82/53 97 %   01/14/20 1235 98.1 ??F (36.7 ??C) (!) 104 18 (!) 89/45 96 %     Oxygen Therapy  O2 Sat (%): 96 % (01/15/20 0818)  Pulse via Oximetry: 88 beats per minute (01/10/20 1544)  O2 Device: None (Room air) (01/15/20 0438)    Estimated body mass index is 16.46 kg/m?? as calculated from the following:    Height as of this encounter: 5\' 2"   (1.575 m).    Weight as of this encounter: 40.8 kg (90 lb).    Intake/Output Summary (Last 24 hours) at 01/15/2020 1154  Last data filed at 01/15/2020 0536  Gross per 24 hour   Intake 100 ml   Output 310 ml   Net -210 ml       *Note that automatically entered I/Os may not be accurate; dependent on patient compliance with collection and accurate data entry by techs.    General:    Thin, pleasant and cooperative, appears stated age, no overt distress.  CV:   RRR.  No m/r/g.  No edema.  No JVD  Lungs:   CTAB.  No wheezing, rhonchi, or rales.  Unlabored.  Abdomen:   Soft, nontender, nondistended.    Extremities: Warm and dry.  No cyanosis   Skin:     No rashes.  Normal coloration  Neuro:  No gross focal deficits.     I have reviewed all labs, meds, and other studies shown below:  Last 24hr Labs:  No results found for this or any previous visit (from the past 24 hour(s)).    All Micro Results     Procedure Component Value Units Date/Time    CULTURE, URINE [id number]  (Abnormal)  (Susceptibility) Collected: 01/10/20 0920    Order Status: Completed Specimen: Cath Urine Updated: 01/15/20 0841     Special Requests: NO SPECIAL REQUESTS        Culture result:       >100,000 COLONIES/mL KLEBSIELLA PNEUMONIAE                  50,000-100,000 COLONIES/mL MIXED SKIN FLORA ISOLATED          SARS-COV-2, PCR [id number] Collected: 01/14/20 1024    Order Status: Completed Specimen: Nasopharyngeal Updated: 01/15/20 0609     Specimen source Nasopharyngeal        SARS-CoV-2 Not detected        Comment:      The specimen is NEGATIVE for SARS-CoV-2, the novel coronavirus associated with COVID-19.       This test has been authorized by the FDA under an Emergency  Use Authorization (EUA) for use by authorized laboratories.        Fact sheet for Healthcare Providers: LittleDVDs.dk       Fact sheet for Patients: SatelliteRebate.it       Methodology: RT-PCR         COVID-19 RAPID TEST [id number]  Collected: 01/14/20 1024    Order Status: Completed Specimen: Nasopharyngeal Updated: 01/14/20 1128     Specimen source NASAL        COVID-19 rapid test Not detected        Comment:      The specimen is NEGATIVE for SARS-CoV-2, the novel coronavirus associated with COVID-19.  A negative result does not rule out COVID-19.       This test has been authorized by the FDA under an Emergency Use Authorization (EUA) for use by authorized laboratories.        Fact sheet for Healthcare Providers: LittleDVDs.dk  Fact sheet for Patients: SatelliteRebate.it       Methodology: Isothermal Nucleic Acid Amplification         CULTURE, BLOOD [id number]     Order Status: Canceled Specimen: Blood           SARS-CoV-2 Lab Results  "Novel Coronavirus" Test: No results found for: COV2NT   "Emergent Disease" Test: No results found for: EDPR  "SARS-COV-2" Test: No results found for: XGCOVT  Rapid Test:   Lab Results   Component Value Date/Time    COVR Not detected 01/14/2020 10:24 AM            Current Meds:  Current Facility-Administered Medications   Medication Dose Route Frequency   ??? cefTRIAXone (ROCEPHIN) 1 g in 0.9% sodium chloride (MBP/ADV) 50 mL MBP  1 g IntraVENous Q24H   ??? oxyCODONE IR (ROXICODONE) tablet 5 mg  5 mg Oral Q6H PRN   ??? sodium chloride (NS) flush 5-40 mL  5-40 mL IntraVENous Q8H   ??? sodium chloride (NS) flush 5-40 mL  5-40 mL IntraVENous PRN   ??? acetaminophen (TYLENOL) tablet 650 mg  650 mg Oral Q6H PRN    Or   ??? acetaminophen (TYLENOL) suppository 650 mg  650 mg Rectal Q6H PRN   ??? polyethylene glycol (MIRALAX) packet 17 g  17 g Oral DAILY PRN   ??? promethazine (PHENERGAN) tablet 12.5 mg  12.5 mg Oral Q6H PRN    Or   ??? ondansetron (ZOFRAN) injection 4 mg  4 mg IntraVENous Q6H PRN   ??? PARoxetine (PAXIL) tablet 20 mg  20 mg Oral DAILY   ??? atorvastatin (LIPITOR) tablet 10 mg  10 mg Oral QHS       Other Studies:  No results found for this visit on 01/10/20.    No results  found.    Signed:  Kary Kos, MD

## 2020-01-15 NOTE — Progress Notes (Signed)
Received report from [place], Therapist, sports. Patient denies any needs at this time. Call bell in reach, bed low and locked. Will continue to monitor.

## 2020-01-15 NOTE — Progress Notes (Signed)
Problem: Risk for Spread of Infection  Goal: Prevent transmission of infectious organism to others  Description: Prevent the transmission of infectious organisms to other patients, staff members, and visitors.  Outcome: Progressing Towards Goal     Problem: Patient Education:  Go to Education Activity  Goal: Patient/Family Education  Outcome: Progressing Towards Goal     Problem: Pressure Injury - Risk of  Goal: *Prevention of pressure injury  Description: Document Braden Scale and appropriate interventions in the flowsheet.  Outcome: Progressing Towards Goal  Note: Pressure Injury Interventions:  Sensory Interventions: Assess changes in LOC, Assess need for specialty bed, Check visual cues for pain, Keep linens dry and wrinkle-free    Moisture Interventions: Absorbent underpads, Apply protective barrier, creams and emollients    Activity Interventions: Assess need for specialty bed, Pressure redistribution bed/mattress(bed type)    Mobility Interventions: HOB 30 degrees or less    Nutrition Interventions: Document food/fluid/supplement intake    Friction and Shear Interventions: Foam dressings/transparent film/skin sealants                Problem: Patient Education: Go to Patient Education Activity  Goal: Patient/Family Education  Outcome: Progressing Towards Goal     Problem: Falls - Risk of  Goal: *Absence of Falls  Description: Document Schmid Fall Risk and appropriate interventions in the flowsheet.  Outcome: Progressing Towards Goal  Note: Fall Risk Interventions:       Mentation Interventions: Adequate sleep, hydration, pain control         Elimination Interventions: Call light in reach              Problem: Patient Education: Go to Patient Education Activity  Goal: Patient/Family Education  Outcome: Progressing Towards Goal

## 2020-01-15 NOTE — Other (Signed)
Bedside and Verbal shift change report to be given to Kelly, RN (oncoming nurse) by self (offgoing nurse). Report included the following information SBAR, Kardex, Intake/Output, MAR and Recent Results.

## 2020-01-16 MED ORDER — LIDOCAINE HCL 1 % (10 MG/ML) IJ SOLN
10 mg/mL (1 %) | Freq: Once | INTRAMUSCULAR | Status: AC
Start: 2020-01-16 — End: 2020-01-16
  Administered 2020-01-16: 16:00:00 via INTRAMUSCULAR

## 2020-01-16 MED FILL — PAROXETINE 20 MG TAB: 20 mg | ORAL | Qty: 1

## 2020-01-16 MED FILL — ATORVASTATIN 20 MG TAB: 20 mg | ORAL | Qty: 1

## 2020-01-16 MED FILL — CEFTRIAXONE 1 GRAM SOLUTION FOR INJECTION: 1 gram | INTRAMUSCULAR | Qty: 1

## 2020-01-16 NOTE — Discharge Summary (Signed)
Hospitalist Discharge Summary     Admit Date:  01/10/2020  9:59 AM   DC note date: 01/16/2020  Name:  Cathy Jackson   Age:  74 y.o.  DOB:  03/03/1946   MRN:  [id number]   PCP:  Wilfred Lacy, NP  Treatment Team: Attending Provider: Kary Kos, MD; Care Manager: Lidia Collum, RN; Utilization Review: Tyson Dense, RN; Staff Nurse: Ernestene Mention, RN; Consulting Provider: Fredrik Cove, NP  Presenting Complaint: Blood infection    Initial Admission Diagnosis: Severe sepsis ([place]) [A41.9, R65.20]     Problem List for this Hospitalization:  Hospital Problems as of 01/16/2020 Date Reviewed: 14-Jun-2018        Codes Class Noted - Resolved POA    Hypoalbuminemia ICD-10-CM: E88.09  ICD-9-CM: 273.8  04/03/2019 - Present Yes        Acquired hypothyroidism ICD-10-CM: E03.9  ICD-9-CM: 244.9  08/29/2015 - Present Yes        Essential hypertension ICD-10-CM: I10  ICD-9-CM: 401.9  08/29/2015 - Present Yes        Mixed hyperlipidemia ICD-10-CM: E78.2  ICD-9-CM: 272.2  08/29/2015 - Present Yes        Frontotemporal dementia ([place]) ICD-10-CM: G31.09, F02.80  ICD-9-CM: 331.19  02/01/2015 - Present Yes    Overview Signed 10/20/2017 10:25 AM by Wilfred Lacy, NP     Last Assessment & Plan:   Disinhibited, agitated, aggressive, violent at times. Will add depakote to try to calm her behaviors down. Might need to adjust namenda, aricept, and paxil, too but will start with depakote. Provided information about the drug, anticipated effects, and possible side effects. Family will call me next week and let me know how she is doing, we may need to increase the dose, but since she is small, we will need to monitor for fall risk.     Daughter inquired about PACE program. She isnt medicaid eligible, I dont think, but out of pocket might be an option for daytime care. She is also going to look into senior day programs. I will refer her daughter to the alz association for additional resources. Might need to send daughter REACH at next appt,  too.              * (Principal) RESOLVED: Severe sepsis ([place]) ICD-10-CM: A41.9, R65.20  ICD-9-CM: 038.9, 995.92  01/10/2020 - 01/16/2020 Yes        RESOLVED: Urinary tract infection due to ESBL Klebsiella ICD-10-CM: N39.0, B96.89  ICD-9-CM: 599.0, 041.84  04/03/2019 - 01/16/2020 Yes        RESOLVED: Lactic acidosis ICD-10-CM: E87.2  ICD-9-CM: 276.2  04/03/2019 - 01/16/2020 Yes        RESOLVED: AKI (acute kidney injury) [facility]) ICD-10-CM: N17.9  ICD-9-CM: 584.9  04/02/2019 - 01/16/2020 Yes            Hospital Course:  [name] is a 74 y/o WF with a h/o chronic nephrostomy tube, GERD, dementia, hypothyroidism and NHC who presented to IR on 5/18 for nephrostomy exchange but was febrile and tachycardic. So she was sent to the ER. She has a h/o ESBL UTIs. WBCs 28K, LA 5.6, urine with pyuria and bacteriuria. She was admitted and started on empiric meropenem given prior history of ESBL UTIs. Se was hypernatremic which resolved with fluids. Her culture resulted with a pan-sensitive klebsiella pneumoniae so her antibiotic was changed to ceftriaxone for 2 more doses, which she completed on 5/24. She has poor PO intake which is not new, and  will certainly be an obstacle to her recovery moving forwards. She was on Hospice prior admission and the current plan is to resume this at discharge. Her hospital course was otherwise unremarkable and she is medically stable for discharge back to her facility today.     Disposition: Skilled Nursing Facility  Activity: Activity as tolerated  Diet: DIET PUREED  DIET NUTRITIONAL SUPPLEMENTS All Meals; Magic Cups ( )  DIET NUTRITIONAL SUPPLEMENTS All Meals; Ensure Enlive ( )  Code Status: DNR    Follow Up Orders:  No orders of the defined types were placed in this encounter.      Follow-up Information     Follow up With Specialties Details Why Contact Info    [facility]) [place]   [place]  [phone]           Time spent in patient discharge and coordination 35 minutes.    Plan was discussed with patient, RN, CM.  All questions answered.  Patient was stable at time of discharge.  Given instructions to call a physician or return if any concerns.  Discharge summary and encounter summary was sent to PCP electronically via "Comm Mgt" link in [facility], if possible.    Discharge Info:   Current Discharge Medication List      CONTINUE these medications which have NOT CHANGED    Details   PARoxetine (PAXIL) 20 mg tablet Take 1 Tab by mouth daily.  Qty: 90 Tab, Refills: 2      food supplemt, lactose-reduced (ENSURE ACTIVE CLEAR) liqd Take 237 mL by mouth four (4) times daily.  Qty: 30 Can, Refills: 3    Associated Diagnoses: Weight loss      traZODone (DESYREL) 50 mg tablet Take 1 Tab by mouth nightly.  Qty: 3 Tab, Refills: 0      simvastatin (ZOCOR) 20 mg tablet TAKE 1 TABLET BY MOUTH EVERY DAY AT NIGHT  Qty: 90 Tab, Refills: 2      calcium carb-vitamin D3-vit K2 600 mg-1,000 unit-90 mcg tab Take  by mouth.      ascorbic acid, vitamin C, (VITAMIN C) 1,000 mg tablet Take  by mouth.      ferrous sulfate (IRON) 325 mg (65 mg iron) tablet Take  by mouth Daily (before breakfast).             Procedures done this admission:  * No surgery found *    Consults this admission:  IP CONSULT TO PALLIATIVE CARE - PROVIDER    Echocardiogram/EKG results:  No results found for this visit on 01/10/20.    Results for orders placed or performed during the hospital encounter of 05/09/19   EKG, 12 LEAD, INITIAL   Result Value Ref Range    Ventricular Rate 106 BPM    Atrial Rate 106 BPM    P-R Interval 148 ms    QRS Duration 62 ms    Q-T Interval 304 ms    QTC Calculation (Bezet) 403 ms    Calculated P Axis 80 degrees    Calculated R Axis 73 degrees    Calculated T Axis 90 degrees    Diagnosis       !! AGE AND GENDER SPECIFIC ECG ANALYSIS !!  Sinus tachycardia  Right atrial enlargement  Borderline ECG  When compared with ECG of 29-Oct-2017  23:35,  No significant change was found  Confirmed by CEBE  MD (UC), Jenny Reichmann  E [phone]) on 05/10/2019 5:52:56 AM     Results for orders placed or performed in visit on 04/01/18   AMB POC EKG ROUTINE W/ 12 LEADS, INTER & REP    Narrative    SINUS RHYTHM WITHIN NORMAL LIMITS--RATE89 QRS 78       Diagnostic Imaging/Tests:   XR CHEST PORT    Result Date: 01/10/2020  EXAM: XR CHEST PORT INDICATION: fever COMPARISON: Chest radiograph, 05/09/2019 FINDINGS: The cardiomediastinal silhouette is within normal limits. Atherosclerotic calcifications in the aorta. Upper lobe predominant emphysema. No superimposed consolidation. No pleural effusion. No pneumothorax. No acute osseous abnormality.     Emphysema without acute superimposed process.     IR EXCHANGE NEPHRO PERC RT SI    Result Date: 01/03/2020  Indication: Ureteral obstruction. The patient is bedbound. The patient's family requests that we remove the nephrostomy catheter in place and antegrade ureteral stent. Consent: Informed written and oral consent was obtained from the patient's family. Procedure:  Maximal sterile barrier technique (including:  cap, mask, sterile gown, sterile gloves, sterile sheet, hand hygiene, and chlorhexidene for cutaneous antisepsis) were used.  With the patient prone the skin around the nephroureteral drain  was prepped and draped in the standard fashion. Lidocaine was administered for local field block. Using fluoroscopic guidance, initial attempts at removing the indwelling nephroureteral catheter is coiled resistance. After numerous catheters and sheaths, the existing nephroureteral catheter was removed. It is heavily encrusted in the distal pigtail. After establishing access back into the existing nephrostomy tract, nephrostogram was performed. The images demonstrate extensive debris and a tortuous ureter. Using a wire and catheter, access was initially drained across the anastomosis, through the ileal conduit and into the ostomy bag. Once  through and through access was obtained, a 45 cm 10 French pigtail catheter was advanced in a retrograde fashion. The pigtail was formed in the renal pelvis. Also, a new. 10 French nephrostomy was positioned in the renal pelvis..  Contrast was administered confirming appropriate position. The catheter was secured with a suture. A drainage bag was attached. Complications: None. Radiation Exposure Indices: Reference Air Kerma (Ka,r) = 109 mGy Dose Area Product/Kerma Area Product (DAP/KAP/PKA) = [id number] cGy-cm2 Fluoroscopy Exposure Time = 22 minutes 6 seconds Contrast:  20 milliliters. Medications: None. Findings: Moderate hydronephrosis. Debris in the renal pelvis. A heavily encrusted existing catheter..     1. Extensive difficulty in removing the existing nephroureteral catheter. 2. A new pigtail ureteral stent was placed in a retrograde fashion with the pigtail in the renal pelvis and the catheter in the ostomy bag. 3. A nephrostomy tube was left in position.. Plan: The patient will recover for approximately 1 hour prior to discharge home. Recommend external drainage for one day. Didn't recommend capping the nephrostomy tube. The patient will return in one week for repeat nephrostogram and hopefully nephrostomy tube removal..       All Micro Results     Procedure Component Value Units Date/Time    CULTURE, URINE [id number]  (Abnormal)  (Susceptibility) Collected: 01/10/20 0920    Order Status: Completed Specimen: Cath Urine Updated: 01/15/20 0841     Special Requests: NO SPECIAL REQUESTS        Culture result:       >100,000 COLONIES/mL KLEBSIELLA PNEUMONIAE                  50,000-100,000 COLONIES/mL MIXED SKIN FLORA ISOLATED          SARS-COV-2, PCR [id number] Collected: 01/14/20 1024  Order Status: Completed Specimen: Nasopharyngeal Updated: 01/15/20 0609     Specimen source Nasopharyngeal        SARS-CoV-2 Not detected        Comment:      The specimen is NEGATIVE for SARS-CoV-2, the novel coronavirus  associated with COVID-19.       This test has been authorized by the FDA under an Emergency Use Authorization (EUA) for use by authorized laboratories.        Fact sheet for Healthcare Providers: LittleDVDs.dk       Fact sheet for Patients: SatelliteRebate.it       Methodology: RT-PCR         COVID-19 RAPID TEST [id number] Collected: 01/14/20 1024    Order Status: Completed Specimen: Nasopharyngeal Updated: 01/14/20 1128     Specimen source NASAL        COVID-19 rapid test Not detected        Comment:      The specimen is NEGATIVE for SARS-CoV-2, the novel coronavirus associated with COVID-19.  A negative result does not rule out COVID-19.       This test has been authorized by the FDA under an Emergency Use Authorization (EUA) for use by authorized laboratories.        Fact sheet for Healthcare Providers: LittleDVDs.dk  Fact sheet for Patients: SatelliteRebate.it       Methodology: Isothermal Nucleic Acid Amplification         CULTURE, BLOOD [id number]     Order Status: Canceled Specimen: Blood           SARS-CoV-2 Lab Results  "Novel Coronavirus" Test: No results found for: COV2NT   "Emergent Disease" Test: No results found for: EDPR  "SARS-COV-2" Test: No results found for: XGCOVT  Rapid Test:   Lab Results   Component Value Date/Time    COVR Not detected 01/14/2020 10:24 AM            Labs: Results:       BMP, Mg, Phos Recent Labs     01/14/20  0922   NA 139   K 5.0   CL 107   CO2 27   AGAP 5*   BUN 14   CREA 0.53*   CA 8.0*   GLU 89      CBC No results for input(s): WBC, RBC, HGB, HCT, PLT, GRANS, LYMPH, EOS, MONOS, BASOS, IG, ANEU, ABL, ABE, ABM, ABB, AIG, HGBEXT, HCTEXT, PLTEXT in the last 72 hours.   LFT No results for input(s): ALT, TBIL, AP, TP, ALB, GLOB, AGRAT in the last 72 hours.    No lab exists for component: SGOT, GPT   Cardiac Testing No results found for: BNPP, BNP, CPK, RCK1, RCK2, RCK3, RCK4,  CKMB, CKNDX, CKND1, TROPT, TROIQ   Coagulation Tests Lab Results   Component Value Date/Time    Prothrombin time 10.6 10/11/2012 03:40 PM    INR 1.0 10/11/2012 03:40 PM    aPTT 25.7 10/11/2012 03:40 PM      A1c Lab Results   Component Value Date/Time    Hemoglobin A1c 5.7 (H) 04/01/2018 11:15 AM      Lipid Panel Lab Results   Component Value Date/Time    Cholesterol, total 146 03/01/2018 08:57 AM    HDL Cholesterol 51 03/01/2018 08:57 AM    LDL, calculated 67 03/01/2018 08:57 AM    VLDL, calculated 28 03/01/2018 08:57 AM    Triglyceride 138 03/01/2018 08:57 AM      Thyroid Panel Lab  Results   Component Value Date/Time    TSH 1.870 04/02/2019 04:56 PM    TSH 1.950 04/01/2018 11:15 AM    T4, Free 1.10 01/08/2016 04:16 PM        Most Recent UA Lab Results   Component Value Date/Time    Color YELLOW 05/10/2019 12:25 AM    Appearance CLOUDY 05/10/2019 12:25 AM    Specific gravity 1.012 05/10/2019 12:25 AM    pH (UA) 7.0 05/10/2019 12:25 AM    Protein 30 (A) 05/10/2019 12:25 AM    Glucose Negative 05/10/2019 12:25 AM    Ketone Negative 05/10/2019 12:25 AM    Bilirubin Negative 05/10/2019 12:25 AM    Blood TRACE (A) 05/10/2019 12:25 AM    Urobilinogen 1.0 05/10/2019 12:25 AM    Nitrites Positive (A) 05/10/2019 12:25 AM    Leukocyte Esterase LARGE (A) 05/10/2019 12:25 AM    WBC >100 01/10/2020 09:20 AM    RBC 20-50 01/10/2020 09:20 AM    Epithelial cells 0-3 01/10/2020 09:20 AM    Bacteria 4+ (H) 01/10/2020 09:20 AM    Casts 0 01/10/2020 09:20 AM    Crystals, urine 0 01/10/2020 09:20 AM    Mucus 0 01/10/2020 09:20 AM    Other observations RESULTS VERIFIED MANUALLY 01/10/2020 09:20 AM          All Labs from Last 24 Hrs:  No results found for this or any previous visit (from the past 24 hour(s)).    Discharge Exam:  Patient Vitals for the past 24 hrs:   Temp Pulse Resp BP SpO2   01/16/20 0743 97.7 ??F (36.5 ??C) (!) 101 18 (!) 96/59 96 %   01/16/20 0328 98.1 ??F (36.7 ??C) 94 18 (!) 108/58 98 %   01/15/20 2349 97.9 ??F (36.6 ??C)  91 18 116/66 97 %   01/15/20 1936 97.7 ??F (36.5 ??C) 96 18 104/64 97 %   01/15/20 1635 98.1 ??F (36.7 ??C) 97 ??? 106/66 94 %   01/15/20 1155 97.8 ??F (36.6 ??C) (!) 106 18 101/64 96 %     Oxygen Therapy  O2 Sat (%): 96 % (01/16/20 0743)  Pulse via Oximetry: 88 beats per minute (01/10/20 1544)  O2 Device: None (Room air) (01/15/20 0438)    Estimated body mass index is 16.46 kg/m?? as calculated from the following:    Height as of this encounter: 5\' 2"  (1.575 m).    Weight as of this encounter: 40.8 kg (90 lb).    Intake/Output Summary (Last 24 hours) at 01/16/2020 1122  Last data filed at 01/16/2020 0113  Gross per 24 hour   Intake 470 ml   Output 600 ml   Net -130 ml       *Note that automatically entered I/Os may not be accurate; dependent on patient compliance with collection and accurate data entry by assistants.    General:    Thin.  No overt distress  Eyes:   Normal sclerae.  Extraocular movements intact.  ENT:  Normocephalic, atraumatic.  Moist mucous membranes.  CV:   Regular rate and rhythm.  No m/r/g.  No edema.  Lungs:  CTAB.  No wheezing, rhonchi, or rales.  Unlabored  Abdomen: Soft, nontender, nondistended.   Extremities: Warm and dry.  No cyanosis or clubbing  Neurologic: CN II-XII grossly intact. No gross focal deficits.  Alert, mildly confused but pleasant, conversant and cooperative with my examination.  Skin:     No rashes.  No jaundice.    Psych:  Normal mood and affect.    Current Med List in Hospital:   Current Facility-Administered Medications   Medication Dose Route Frequency   ??? cefTRIAXone (ROCEPHIN) 1 g in 0.9% sodium chloride (MBP/ADV) 50 mL MBP  1 g IntraVENous Q24H   ??? oxyCODONE IR (ROXICODONE) tablet 5 mg  5 mg Oral Q6H PRN   ??? sodium chloride (NS) flush 5-40 mL  5-40 mL IntraVENous Q8H   ??? sodium chloride (NS) flush 5-40 mL  5-40 mL IntraVENous PRN   ??? acetaminophen (TYLENOL) tablet 650 mg  650 mg Oral Q6H PRN    Or   ??? acetaminophen (TYLENOL) suppository 650 mg  650 mg Rectal Q6H PRN   ???  polyethylene glycol (MIRALAX) packet 17 g  17 g Oral DAILY PRN   ??? promethazine (PHENERGAN) tablet 12.5 mg  12.5 mg Oral Q6H PRN    Or   ??? ondansetron (ZOFRAN) injection 4 mg  4 mg IntraVENous Q6H PRN   ??? PARoxetine (PAXIL) tablet 20 mg  20 mg Oral DAILY   ??? atorvastatin (LIPITOR) tablet 10 mg  10 mg Oral QHS       No Known Allergies  Immunization History   Administered Date(s) Administered   ??? Influenza Vaccine 02/25/2011   ??? TB Skin Test (PPD) Intradermal 04/02/2019, 05/10/2019       Signed:  Kary Kos, MD

## 2020-01-16 NOTE — Progress Notes (Signed)
Pt to d/c back to LTC at [facility]. Pt to receive last dose of IV ABT today. Education administrator with [employer] for 1400. Report to be called to [phone]. Primary RN aware to call report. Pt will go to room 325A. Contacted daughter, Danelle Berry, and reported d/c plans. Daughter agreeable and reports wanting hospice services resumed at facility, order placed to resume. [place] liaison aware. All needs met. CM available if any new needs arise.     Care Management Interventions  PCP Verified by CM: Yes [facility] MD)  Mode of Transport at Discharge: BLS  Transition of Care Consult (CM Consult): SNF  Partner SNF: No  Reason Why Partner SNF Not Chosen:  (previously admitted from this facility )  Discharge Durable Medical Equipment: No  Physical Therapy Consult: No  Occupational Therapy Consult: No  Speech Therapy Consult: No  Current Support Network: [place] ([place])  Confirm Follow Up Transport: Other (see comment) (SNF facility )  The Plan for Transition of Care is Related to the Following Treatment Goals : Return to baseline   The Patient and/or Patient Representative was Provided with a Choice of Provider and Agrees with the Discharge Plan?: Yes  Freedom of Choice List was Provided with Basic Dialogue that Supports the Patient's Individualized Plan of Care/Goals, Treatment Preferences and Shares the Quality Data Associated with the Providers?: Yes  Veteran Resource Information Provided?: No  Discharge Location  Discharge Placement: [facility] with hospice services )

## 2020-01-16 NOTE — Consults (Signed)
Per Dr Ninfa Linden, [place] is not needed, as family will be returning to hospice care on discharge.

## 2020-01-16 NOTE — Progress Notes (Signed)
Problem: Risk for Spread of Infection  Goal: Prevent transmission of infectious organism to others  Description: Prevent the transmission of infectious organisms to other patients, staff members, and visitors.  Outcome: Progressing Towards Goal     Problem: Patient Education:  Go to Education Activity  Goal: Patient/Family Education  Outcome: Progressing Towards Goal     Problem: Pressure Injury - Risk of  Goal: *Prevention of pressure injury  Description: Document Braden Scale and appropriate interventions in the flowsheet.  Outcome: Progressing Towards Goal  Note: Pressure Injury Interventions:  Sensory Interventions: Assess changes in LOC, Assess need for specialty bed, Avoid rigorous massage over bony prominences, Discuss PT/OT consult with provider, Float heels, Check visual cues for pain, Keep linens dry and wrinkle-free, Minimize linen layers, Monitor skin under medical devices, Turn and reposition approx. every two hours (pillows and wedges if needed)    Moisture Interventions: Absorbent underpads, Apply protective barrier, creams and emollients, Assess need for specialty bed, Check for incontinence Q2 hours and as needed    Activity Interventions: Assess need for specialty bed, Pressure redistribution bed/mattress(bed type), PT/OT evaluation    Mobility Interventions: HOB 30 degrees or less, Float heels, Turn and reposition approx. every two hours(pillow and wedges)    Nutrition Interventions: Document food/fluid/supplement intake, Discuss nutritional consult with provider, Offer support with meals,snacks and hydration    Friction and Shear Interventions: Foam dressings/transparent film/skin sealants, HOB 30 degrees or less, Minimize layers                Problem: Patient Education: Go to Patient Education Activity  Goal: Patient/Family Education  Outcome: Progressing Towards Goal     Problem: Falls - Risk of  Goal: *Absence of Falls  Description: Document Schmid Fall Risk and appropriate interventions in  the flowsheet.  Outcome: Progressing Towards Goal  Note: Fall Risk Interventions:       Mentation Interventions: Door open when patient unattended, More frequent rounding         Elimination Interventions: Call light in reach, Bed/chair exit alarm              Problem: Patient Education: Go to Patient Education Activity  Goal: Patient/Family Education  Outcome: Progressing Towards Goal

## 2020-01-17 NOTE — Progress Notes (Signed)
No TOC call indicated at this time due to patient being discharged to a LTC [facility]). Will forward to SNF RN Coordinator to include in weekly care coordination call.

## 2020-01-31 ENCOUNTER — Inpatient Hospital Stay
Admit: 2020-01-31 | Discharge: 2020-01-31 | Disposition: A | Discharge status: Home / Self Care | Payer: MEDICARE | Attending: Emergency Medicine

## 2020-01-31 ENCOUNTER — Emergency Department: Admit: 2020-01-31 | Payer: MEDICARE | Primary: Community Health

## 2020-01-31 DIAGNOSIS — N135 Crossing vessel and stricture of ureter without hydronephrosis: Secondary | ICD-10-CM

## 2020-01-31 LAB — METABOLIC PANEL, COMPREHENSIVE
A-G Ratio: 0.7 — ABNORMAL LOW (ref 1.2–3.5)
ALT (SGPT): 22 U/L (ref 12–65)
AST (SGOT): 23 U/L (ref 15–37)
Albumin: 2.9 g/dL — ABNORMAL LOW (ref 3.2–4.6)
Alk. phosphatase: 88 U/L (ref 50–136)
Anion gap: 6 mmol/L — ABNORMAL LOW (ref 7–16)
BUN: 12 MG/DL (ref 8–23)
Bilirubin, total: 0.5 MG/DL (ref 0.2–1.1)
CO2: 25 mmol/L (ref 21–32)
Calcium: 9 MG/DL (ref 8.3–10.4)
Chloride: 105 mmol/L (ref 98–107)
Creatinine: 0.7 MG/DL (ref 0.6–1.0)
GFR est AA: >60 mL/min/{1.73_m2} (ref >60)
GFR est non-AA: >60 mL/min/{1.73_m2} (ref >60)
Globulin: 4.2 g/dL — ABNORMAL HIGH (ref 2.3–3.5)
Glucose: 95 mg/dL (ref 65–100)
Potassium: 4.4 mmol/L (ref 3.5–5.1)
Protein, total: 7.1 g/dL (ref 6.3–8.2)
Sodium: 136 mmol/L (ref 136–145)

## 2020-01-31 LAB — CBC W/O DIFF
ABSOLUTE NRBC: 0 10*3/uL (ref 0.0–0.2)
HCT: 45.9 % (ref 35.8–46.3)
HGB: 14.7 g/dL (ref 11.7–15.4)
MCH: 29.8 PG (ref 26.1–32.9)
MCHC: 32 g/dL (ref 31.4–35.0)
MCV: 92.9 FL (ref 79.6–97.8)
MPV: 9.2 FL — ABNORMAL LOW (ref 9.4–12.3)
PLATELET: 309 10*3/uL (ref 150–450)
RBC: 4.94 M/uL (ref 4.05–5.2)
RDW: 14.4 % (ref 11.9–14.6)
WBC: 8.6 10*3/uL (ref 4.3–11.1)

## 2020-01-31 LAB — PROTHROMBIN TIME + INR
INR: 0.9
Prothrombin time: 12.3 s — ABNORMAL LOW (ref 12.5–14.7)

## 2020-01-31 MED ORDER — DIAZEPAM 5 MG TAB
5 mg | Freq: Once | ORAL | Status: DC
Start: 2020-01-31 — End: 2020-01-31

## 2020-01-31 MED ORDER — DIAZEPAM 5 MG TAB
5 mg | ORAL | Status: AC
Start: 2020-01-31 — End: ?

## 2020-01-31 MED ORDER — LIDOCAINE HCL 2 % (20 MG/ML) IJ SOLN
20 mg/mL (2 %) | INTRAMUSCULAR | Status: AC
Start: 2020-01-31 — End: ?

## 2020-01-31 MED ORDER — HYDROCODONE-ACETAMINOPHEN 5 MG-325 MG TAB
5-325 mg | Freq: Once | ORAL | Status: AC
Start: 2020-01-31 — End: 2020-01-31
  Administered 2020-01-31: 14:00:00 via ORAL

## 2020-01-31 MED ORDER — DIAZEPAM 5 MG TAB
5 mg | Freq: Once | ORAL | Status: AC
Start: 2020-01-31 — End: 2020-01-31
  Administered 2020-01-31: 14:00:00 via ORAL

## 2020-01-31 MED ORDER — LIDOCAINE HCL 2 % (20 MG/ML) IJ SOLN
20 mg/mL (2 %) | INTRAMUSCULAR | Status: DC | PRN
Start: 2020-01-31 — End: 2020-01-31
  Administered 2020-01-31: 15:00:00 via INTRADERMAL

## 2020-01-31 MED ORDER — IOPAMIDOL 61 % IV SOLN
300 mg iodine /mL (61 %) | Freq: Once | INTRAVENOUS | Status: AC
Start: 2020-01-31 — End: 2020-01-31
  Administered 2020-01-31: 15:00:00

## 2020-01-31 MED ORDER — HYDROCODONE-ACETAMINOPHEN 5 MG-325 MG TAB
5-325 mg | ORAL | Status: AC
Start: 2020-01-31 — End: ?

## 2020-01-31 MED FILL — DIAZEPAM 5 MG TAB: 5 mg | ORAL | Qty: 1

## 2020-01-31 MED FILL — XYLOCAINE 20 MG/ML (2 %) INJECTION SOLUTION: 20 mg/mL (2 %) | INTRAMUSCULAR | Qty: 20

## 2020-01-31 MED FILL — HYDROCODONE-ACETAMINOPHEN 5 MG-325 MG TAB: 5-325 mg | ORAL | Qty: 1

## 2020-02-04 LAB — CULTURE, BODY FLUID W GRAM STAIN: GRAM STAIN: 0

## 2020-02-09 MED ORDER — PENICILLIN V-K 500 MG TAB
500 mg | ORAL_TABLET | Freq: Four times a day (QID) | ORAL | 0 refills | Status: DC
Start: 2020-02-09 — End: 2020-02-09

## 2020-02-09 MED ORDER — PENICILLIN V-K 500 MG TAB
500 mg | ORAL_TABLET | Freq: Four times a day (QID) | ORAL | 0 refills | Status: AC
Start: 2020-02-09 — End: 2020-02-16

## 2020-02-21 ENCOUNTER — Other Ambulatory Visit: Payer: Self-pay | Admitting: Family Medicine

## 2020-03-12 DIAGNOSIS — H2513 Age-related nuclear cataract, bilateral: Secondary | ICD-10-CM | POA: Diagnosis not present

## 2020-03-30 ENCOUNTER — Ambulatory Visit (INDEPENDENT_AMBULATORY_CARE_PROVIDER_SITE_OTHER): Payer: Medicare PPO | Admitting: Family Medicine

## 2020-03-30 ENCOUNTER — Other Ambulatory Visit: Payer: Self-pay

## 2020-03-30 ENCOUNTER — Encounter: Payer: Self-pay | Admitting: Family Medicine

## 2020-03-30 VITALS — BP 136/74 | HR 58 | Temp 98.6°F | Resp 14 | Ht 67.0 in | Wt 186.0 lb

## 2020-03-30 DIAGNOSIS — R5383 Other fatigue: Secondary | ICD-10-CM | POA: Diagnosis not present

## 2020-03-30 DIAGNOSIS — E7800 Pure hypercholesterolemia, unspecified: Secondary | ICD-10-CM | POA: Diagnosis not present

## 2020-03-30 DIAGNOSIS — R6889 Other general symptoms and signs: Secondary | ICD-10-CM | POA: Diagnosis not present

## 2020-03-30 DIAGNOSIS — I10 Essential (primary) hypertension: Secondary | ICD-10-CM

## 2020-03-30 DIAGNOSIS — Z8673 Personal history of transient ischemic attack (TIA), and cerebral infarction without residual deficits: Secondary | ICD-10-CM | POA: Diagnosis not present

## 2020-03-30 DIAGNOSIS — Z9889 Other specified postprocedural states: Secondary | ICD-10-CM

## 2020-03-30 MED ORDER — HYDROCHLOROTHIAZIDE 12.5 MG PO CAPS: 12.5000 mg | ORAL_CAPSULE | Freq: Every day | ORAL | 2 refills | Status: DC

## 2020-03-30 NOTE — Assessment & Plan Note (Signed)
Continue statin, ASA Blood pressure control

## 2020-03-30 NOTE — Patient Instructions (Addendum)
Take the HCTZ 12.5mg  once a day and atenolol F/U 4 months  Bring your machine for nurse visit to check your blood pressure

## 2020-03-30 NOTE — Progress Notes (Signed)
   Subjective:    Patient ID: Molly Patel, female    DOB: 1945-12-25, 74 y.o.   MRN: [id number]  Patient presents for Follow-up (is not fasting) Pt here to f/u   Chronic medical problems.  Medications reviewed.  She has been walking for exercise early in the morning   HTN- taking Bp meds as prescribed , taking atenolol daily but only occ taking HCTZ   BP has been  130-150/ 70-80, BP goes up and down, when bp is elevated she does not have any symptoms   Hyperlipidemia- taking lipitor 10mg  , last LDL  105  S/p craniomtomy- taking keppra For memory on aricept   Taking ASA 325mg  once a day - history of stroke   Taking AZO for urine to prevent UTI  She has fatigue, wondered if her B12 was low, she started taking centrum silver    Review Of Systems:  GEN- + fatigue, fever, weight loss,weakness, recent illness HEENT- denies eye drainage, change in vision, nasal discharge, CVS- denies chest pain, palpitations RESP- denies SOB, cough, wheeze ABD- denies N/V, change in stools, abd pain GU- denies dysuria, hematuria, dribbling, incontinence MSK- denies joint pain, muscle aches, injury Neuro- denies headache, dizziness, syncope, seizure activity       Objective:    BP 136/74   Pulse (!) 58   Temp 98.6 F (37 C) (Temporal)   Resp 14   Ht 5\' 7"  (1.702 m)   Wt 186 lb (84.4 kg)   SpO2 96%   BMI 29.13 kg/m  GEN- NAD, alert and oriented x3 HEENT- PERRL, EOMI, non injected sclera, pink conjunctiva, MMM, oropharynx clear Neck- Supple, no thyromegaly CVS- RRR, no murmur RESP-CTAB ABD-NABS,soft,NT,ND NEURO-CNII-XII in tact, no new deicits  EXT- No edema Pulses- Radial, DP- 2+        Assessment & Plan:      Problem List Items Addressed This Visit      Unprioritized   History of CVA (cerebrovascular accident)    Continue statin, ASA Blood pressure control      Hyperlipidemia    Continue lipitor       Relevant Medications   hydrochlorothiazide (MICROZIDE)  12.5 MG capsule   Hypertension - Primary    Restart HCTZ daily along with atenolol for bp She will bring in her home meter so we can check as well      Relevant Medications   hydrochlorothiazide (MICROZIDE) 12.5 MG capsule   Other Relevant Orders   CBC with Differential/Platelet (Completed)   Comprehensive metabolic panel (Completed)   TSH (Completed)   Vitamin B12 (Completed)   S/P craniotomy    Doing well, continue keppra for seizure prophylaxis, no headaches Also on Aricept without any SE        Other Visit Diagnoses    Other fatigue       Relevant Orders   Vitamin B12 (Completed)      Note: This dictation was prepared with Dragon dictation along with smaller phrase technology. Any transcriptional errors that result from this process are unintentional.

## 2020-03-31 LAB — CBC WITH DIFFERENTIAL/PLATELET
Absolute Monocytes: 516 cells/uL (ref 200–950)
Basophils Absolute: 48 cells/uL (ref 0–200)
Basophils Relative: 1.2 %
Eosinophils Absolute: 68 cells/uL (ref 15–500)
Eosinophils Relative: 1.7 %
HCT: 41.2 % (ref 35.0–45.0)
Hemoglobin: 13.7 g/dL (ref 11.7–15.5)
Lymphs Abs: 1280 cells/uL (ref 850–3900)
MCH: 30.3 pg (ref 27.0–33.0)
MCHC: 33.3 g/dL (ref 32.0–36.0)
MCV: 91.2 fL (ref 80.0–100.0)
MPV: 11.6 fL (ref 7.5–12.5)
Monocytes Relative: 12.9 %
Neutro Abs: 2088 cells/uL (ref 1500–7800)
Neutrophils Relative %: 52.2 %
Platelets: 152 10*3/uL (ref 140–400)
RBC: 4.52 10*6/uL (ref 3.80–5.10)
RDW: 13.3 % (ref 11.0–15.0)
Total Lymphocyte: 32 %
WBC: 4 10*3/uL (ref 3.8–10.8)

## 2020-03-31 LAB — COMPREHENSIVE METABOLIC PANEL
AG Ratio: 1.6 (calc) (ref 1.0–2.5)
ALT: 16 U/L (ref 6–29)
AST: 19 U/L (ref 10–35)
Albumin: 4.3 g/dL (ref 3.6–5.1)
Alkaline phosphatase (APISO): 70 U/L (ref 37–153)
BUN: 10 mg/dL (ref 7–25)
CO2: 22 mmol/L (ref 20–32)
Calcium: 9.4 mg/dL (ref 8.6–10.4)
Chloride: 102 mmol/L (ref 98–110)
Creat: 0.82 mg/dL (ref 0.60–0.93)
Globulin: 2.7 g/dL (calc) (ref 1.9–3.7)
Glucose, Bld: 69 mg/dL (ref 65–99)
Potassium: 4.2 mmol/L (ref 3.5–5.3)
Sodium: 139 mmol/L (ref 135–146)
Total Bilirubin: 0.8 mg/dL (ref 0.2–1.2)
Total Protein: 7 g/dL (ref 6.1–8.1)

## 2020-03-31 LAB — TSH: TSH: 2.8 mIU/L (ref 0.40–4.50)

## 2020-03-31 LAB — VITAMIN B12: Vitamin B-12: 489 pg/mL (ref 200–1100)

## 2020-04-01 NOTE — Assessment & Plan Note (Signed)
Continue lipitor  ?

## 2020-04-01 NOTE — Assessment & Plan Note (Signed)
Doing well, continue keppra for seizure prophylaxis, no headaches Also on Aricept without any SE

## 2020-04-01 NOTE — Assessment & Plan Note (Signed)
Restart HCTZ daily along with atenolol for bp She will bring in her home meter so we can check as well

## 2020-05-03 ENCOUNTER — Inpatient Hospital Stay: Admit: 2020-05-03 | Payer: MEDICARE | Attending: Diagnostic Radiology | Primary: Community Health

## 2020-05-03 ENCOUNTER — Encounter

## 2020-05-03 DIAGNOSIS — N135 Crossing vessel and stricture of ureter without hydronephrosis: Secondary | ICD-10-CM

## 2020-05-03 MED ORDER — LIDOCAINE HCL 2 % (20 MG/ML) IJ SOLN
20 mg/mL (2 %) | INTRAMUSCULAR | Status: DC | PRN
Start: 2020-05-03 — End: 2020-05-07
  Administered 2020-05-03: 14:00:00 via INTRADERMAL

## 2020-05-03 MED ORDER — HYDROCODONE-ACETAMINOPHEN 5 MG-325 MG TAB
5-325 mg | Freq: Once | ORAL | Status: AC
Start: 2020-05-03 — End: 2020-05-03
  Administered 2020-05-03: 14:00:00 via ORAL

## 2020-05-03 MED ORDER — LIDOCAINE HCL 2 % (20 MG/ML) IJ SOLN
20 mg/mL (2 %) | INTRAMUSCULAR | Status: AC
Start: 2020-05-03 — End: ?

## 2020-05-03 MED ORDER — IOPAMIDOL 61 % IV SOLN
61 % | Freq: Once | INTRAVENOUS | Status: AC
Start: 2020-05-03 — End: 2020-05-03
  Administered 2020-05-03: 15:00:00

## 2020-05-03 MED ORDER — DIAZEPAM 5 MG TAB
5 mg | ORAL | Status: AC
Start: 2020-05-03 — End: ?

## 2020-05-03 MED ORDER — HYDROCODONE-ACETAMINOPHEN 5 MG-325 MG TAB
5-325 mg | ORAL | Status: AC
Start: 2020-05-03 — End: ?

## 2020-05-03 MED ORDER — DIAZEPAM 5 MG TAB
5 mg | Freq: Once | ORAL | Status: AC
Start: 2020-05-03 — End: 2020-05-03
  Administered 2020-05-03: 14:00:00 via ORAL

## 2020-05-03 MED FILL — XYLOCAINE 20 MG/ML (2 %) INJECTION SOLUTION: 20 mg/mL (2 %) | INTRAMUSCULAR | Qty: 20

## 2020-05-03 MED FILL — HYDROCODONE-ACETAMINOPHEN 5 MG-325 MG TAB: 5-325 mg | ORAL | Qty: 1

## 2020-05-03 MED FILL — DIAZEPAM 5 MG TAB: 5 mg | ORAL | Qty: 1

## 2020-07-03 ENCOUNTER — Inpatient Hospital Stay: Payer: MEDICARE | Attending: Diagnostic Radiology | Primary: Community Health

## 2020-07-24 ENCOUNTER — Inpatient Hospital Stay: Payer: MEDICAID | Attending: Diagnostic Radiology | Primary: Community Health

## 2020-07-25 DEATH — deceased

## 2020-07-26 ENCOUNTER — Other Ambulatory Visit: Payer: Self-pay | Admitting: Family Medicine

## 2020-08-06 ENCOUNTER — Other Ambulatory Visit (HOSPITAL_COMMUNITY): Payer: Self-pay | Admitting: Family Medicine

## 2020-08-06 DIAGNOSIS — Z1231 Encounter for screening mammogram for malignant neoplasm of breast: Secondary | ICD-10-CM

## 2020-08-08 ENCOUNTER — Other Ambulatory Visit: Payer: Self-pay | Admitting: Family Medicine

## 2020-08-30 ENCOUNTER — Other Ambulatory Visit: Payer: Self-pay | Admitting: Family Medicine

## 2020-09-10 ENCOUNTER — Ambulatory Visit (HOSPITAL_COMMUNITY): Payer: Medicare PPO

## 2020-09-13 ENCOUNTER — Inpatient Hospital Stay (HOSPITAL_COMMUNITY): Admission: RE | Admit: 2020-09-13 | Payer: Medicare PPO | Source: Ambulatory Visit

## 2020-09-19 ENCOUNTER — Other Ambulatory Visit (HOSPITAL_COMMUNITY): Payer: Self-pay | Admitting: Family Medicine

## 2020-09-19 ENCOUNTER — Other Ambulatory Visit (HOSPITAL_COMMUNITY): Payer: Self-pay | Admitting: Radiology

## 2020-09-19 ENCOUNTER — Other Ambulatory Visit (HOSPITAL_COMMUNITY): Payer: Self-pay | Admitting: Internal Medicine

## 2020-09-24 ENCOUNTER — Ambulatory Visit (HOSPITAL_COMMUNITY): Payer: Medicare PPO

## 2020-09-27 ENCOUNTER — Ambulatory Visit (HOSPITAL_COMMUNITY)
Admission: RE | Admit: 2020-09-27 | Discharge: 2020-09-27 | Disposition: A | Discharge status: Home / Self Care | Payer: Medicare PPO | Source: Ambulatory Visit | Attending: Family Medicine | Admitting: Family Medicine

## 2020-09-27 ENCOUNTER — Other Ambulatory Visit: Payer: Self-pay

## 2020-09-27 DIAGNOSIS — Z1231 Encounter for screening mammogram for malignant neoplasm of breast: Secondary | ICD-10-CM | POA: Diagnosis not present

## 2020-10-01 ENCOUNTER — Encounter: Payer: Self-pay | Admitting: Family Medicine

## 2020-10-01 ENCOUNTER — Other Ambulatory Visit: Payer: Self-pay

## 2020-10-01 ENCOUNTER — Ambulatory Visit (INDEPENDENT_AMBULATORY_CARE_PROVIDER_SITE_OTHER): Payer: Medicare PPO | Admitting: Family Medicine

## 2020-10-01 VITALS — BP 122/68 | HR 82 | Temp 98.3°F | Resp 14 | Ht 67.0 in | Wt 186.0 lb

## 2020-10-01 DIAGNOSIS — H409 Unspecified glaucoma: Secondary | ICD-10-CM

## 2020-10-01 DIAGNOSIS — Z0001 Encounter for general adult medical examination with abnormal findings: Secondary | ICD-10-CM

## 2020-10-01 DIAGNOSIS — Z8673 Personal history of transient ischemic attack (TIA), and cerebral infarction without residual deficits: Secondary | ICD-10-CM | POA: Diagnosis not present

## 2020-10-01 DIAGNOSIS — R519 Headache, unspecified: Secondary | ICD-10-CM | POA: Diagnosis not present

## 2020-10-01 DIAGNOSIS — Z9889 Other specified postprocedural states: Secondary | ICD-10-CM

## 2020-10-01 DIAGNOSIS — Z0000 Encounter for general adult medical examination without abnormal findings: Secondary | ICD-10-CM

## 2020-10-01 DIAGNOSIS — E7800 Pure hypercholesterolemia, unspecified: Secondary | ICD-10-CM

## 2020-10-01 DIAGNOSIS — I10 Essential (primary) hypertension: Secondary | ICD-10-CM

## 2020-10-01 NOTE — Patient Instructions (Signed)
Try Tylenol for pain  We will call with lab results Drink more water

## 2020-10-01 NOTE — Progress Notes (Signed)
Subjective:   Patient presents for Medicare Annual/Subsequent preventive examination.  HTN- taking bp as prescribed, no concerns  Hyperlipidemia- taking lipitor at bedtime    Cognitive decline- taking aricept    s/p craniotomoy- she takes keppra for seizure prophylaxis    she has been stressed since her sister passed away a few weeks ago  she has had mild headache, has taken ibuprofen, and it helps the HA go away , IT IS NOT daily No recent fall. No change in vision , no head cold, no dizzy spells,  sleep is okay   Review Past Medical/Family/Social:per EMR  Risk Factors Current exercise habits:walks Dietary issues discussed:Yes  Cardiac risk factors:Stroke, HTN, Hyperliopidemia  Depression Screen (Note: if answer to either of the following is "Yes", a more complete depression screening is indicated)  Over the past two weeks, have you felt down, depressed or hopeless? No Over the past two weeks, have you felt little interest or pleasure in doing things? No Have you lost interest or pleasure in daily life? No Do you often feel hopeless? No Do you cry easily over simple problems?No   Activities of Daily Living In your present state of health, do you have any difficulty performing the following activities?:  Driving? No  Managing money? No  Feeding yourself? No  Getting from bed to chair? No  Climbing a flight of stairs? No  Preparing food and eating?: No  Bathing or showering? No  Getting dressed: No  Getting to the toilet? No  Using the toilet:No  Moving around from place to place: No  In the past year have you fallen or had a near fall?:No  o   Hearing Difficulties:No  Do you often ask people to speak up or repeat themselves? No  Do you experience ringing or noises in your ears? No Do you have difficulty understanding soft or whispered voices? No  Do you feel that you have a problem with memory? Yes Do you often misplace items? No  Do you feel  safe at home?Yes  Cognitive Testing Alert? Yes Normal Appearance?Yes  Oriented to person? Yes Place? Yes  Time? Yes  Recall of three objects? Yes  Can perform simple calculations? Yes  Displays appropriate judgment?Yes  Can read the correct time from a watch face?Yes  List the Names of Other Physician/Practitioners you currently use: Ophthalmology - [facility], [facility], podiatry  Screening Tests / Date ColonoscopyUTD ZostavaxDeclined MammogramUTD 2021 Influenza VaccineDecline  Tetanus/tdapUTD Pneumonia- UTD COVID UTD, PNA UTD  Normal Bone Density 2014  ROS: GEN- denies fatigue, fever, weight loss,weakness, recent illness HEENT- denies eye drainage, change in vision, nasal discharge, CVS- denies chest pain, palpitations RESP- denies SOB, cough, wheeze ABD- denies N/V, change in stools, abd pain GU- denies dysuria, hematuria, dribbling, incontinence MSK- denies joint pain, muscle aches, injury Neuro- denies headache, dizziness, syncope, seizure activity  PHYSICAL: GEN- NAD, alert and oriented x3,vitals reviewed HEENT- PERRL, EOMI, non injected sclera, pink conjunctiva,  has mild drooping eyelids, TM clear no effusion  Neck- Supple, no thryomegaly, no JVD , no bruit  CVS- RRR, no murmur RESP-CTAB ABD-NABS,soft,NT,ND NEURO-CNII-XII in tact, no focal deficit, neg rhomberg, no nystagmus EXT-pedaledema Pulses- Radial, DP- 2+  Assessment:   Annual wellness medicare exam   Plan:   During the course of the visit the patient was educated and counseled about appropriate screening and preventive services including: Audit C/Depression - negative Increased fall risk   S/P craniotomy- taking keppra   Mild HA , no neurological  deficits ADVISED to use tylenol for any HA, given samples of tylenol 500mg  tabs in office. If HA becomes daily worsens, has other symptoms, would obtain imaging of head with her anuersym  history  Recent passing of sister, she states she has support from her kids/family  HTN- well controlled  Glaucoma- per eye doctor , note she does not want surgical intervention for her drooping eyelids  HLD- - takingstatin drugRecheck lipids and LFT today   History of stroke on  Statin /ASA  with some cognitive decline on aricept  Full Code, but does not want prolonged life saving measures on ventilator    Diet review for nutrition referral? Yes ____ Not Indicated __x__  Patient Instructions (the written plan) was given to the patient.  Medicare Attestation  I have personally reviewed:  The patient's medical and social history  Their use of alcohol, tobacco or illicit drugs  Their current medications and supplements  The patient's functional ability including ADLs,fall risks, home safety risks, cognitive, and hearing and visual impairment  Diet and physical activities  Evidence for depression or mood disorders  The patient's weight, height, BMI, and visual acuity have been recorded in the chart. I have made referrals, counseling, and provided education to the patient based on review of the above and I have provided the patient with a written personalized care plan for preventive services.

## 2020-10-03 ENCOUNTER — Other Ambulatory Visit: Payer: Self-pay

## 2020-10-03 ENCOUNTER — Other Ambulatory Visit: Payer: Medicare PPO

## 2020-10-03 DIAGNOSIS — I10 Essential (primary) hypertension: Secondary | ICD-10-CM | POA: Diagnosis not present

## 2020-10-03 DIAGNOSIS — E7800 Pure hypercholesterolemia, unspecified: Secondary | ICD-10-CM | POA: Diagnosis not present

## 2020-10-03 DIAGNOSIS — Z8673 Personal history of transient ischemic attack (TIA), and cerebral infarction without residual deficits: Secondary | ICD-10-CM | POA: Diagnosis not present

## 2020-10-04 LAB — COMPREHENSIVE METABOLIC PANEL
AG Ratio: 1.7 (calc) (ref 1.0–2.5)
ALT: 17 U/L (ref 6–29)
AST: 21 U/L (ref 10–35)
Albumin: 5 g/dL (ref 3.6–5.1)
Alkaline phosphatase (APISO): 77 U/L (ref 37–153)
BUN: 15 mg/dL (ref 7–25)
CO2: 25 mmol/L (ref 20–32)
Calcium: 9.8 mg/dL (ref 8.6–10.4)
Chloride: 98 mmol/L (ref 98–110)
Creat: 0.9 mg/dL (ref 0.60–0.93)
Globulin: 3 g/dL (calc) (ref 1.9–3.7)
Glucose, Bld: 92 mg/dL (ref 65–99)
Potassium: 3.9 mmol/L (ref 3.5–5.3)
Sodium: 137 mmol/L (ref 135–146)
Total Bilirubin: 0.9 mg/dL (ref 0.2–1.2)
Total Protein: 8 g/dL (ref 6.1–8.1)

## 2020-10-04 LAB — LIPID PANEL
Cholesterol: 190 mg/dL (ref <200)
HDL: 72 mg/dL (ref >=50)
LDL Cholesterol (Calc): 94 mg/dL (calc)
Non-HDL Cholesterol (Calc): 118 mg/dL (calc) (ref <130)
Total CHOL/HDL Ratio: 2.6 (calc) (ref <5.0)
Triglycerides: 138 mg/dL (ref <150)

## 2020-10-04 LAB — CBC WITH DIFFERENTIAL/PLATELET
Absolute Monocytes: 361 cells/uL (ref 200–950)
Basophils Absolute: 32 cells/uL (ref 0–200)
Basophils Relative: 0.9 %
Eosinophils Absolute: 70 cells/uL (ref 15–500)
Eosinophils Relative: 2 %
HCT: 48.4 % — ABNORMAL HIGH (ref 35.0–45.0)
Hemoglobin: 16.2 g/dL — ABNORMAL HIGH (ref 11.7–15.5)
Lymphs Abs: 1176 cells/uL (ref 850–3900)
MCH: 30.6 pg (ref 27.0–33.0)
MCHC: 33.5 g/dL (ref 32.0–36.0)
MCV: 91.3 fL (ref 80.0–100.0)
MPV: 11.1 fL (ref 7.5–12.5)
Monocytes Relative: 10.3 %
Neutro Abs: 1862 cells/uL (ref 1500–7800)
Neutrophils Relative %: 53.2 %
Platelets: 132 10*3/uL — ABNORMAL LOW (ref 140–400)
RBC: 5.3 10*6/uL — ABNORMAL HIGH (ref 3.80–5.10)
RDW: 13.3 % (ref 11.0–15.0)
Total Lymphocyte: 33.6 %
WBC: 3.5 10*3/uL — ABNORMAL LOW (ref 3.8–10.8)

## 2020-10-09 ENCOUNTER — Other Ambulatory Visit: Payer: Self-pay

## 2020-10-09 DIAGNOSIS — D696 Thrombocytopenia, unspecified: Secondary | ICD-10-CM

## 2020-11-06 ENCOUNTER — Other Ambulatory Visit: Payer: Self-pay

## 2020-11-06 ENCOUNTER — Other Ambulatory Visit: Payer: Medicare PPO

## 2020-11-06 DIAGNOSIS — D696 Thrombocytopenia, unspecified: Secondary | ICD-10-CM | POA: Diagnosis not present

## 2020-11-06 LAB — CBC WITH DIFFERENTIAL/PLATELET
Absolute Monocytes: 446 cells/uL (ref 200–950)
Basophils Absolute: 40 cells/uL (ref 0–200)
Basophils Relative: 1.1 %
Eosinophils Absolute: 68 cells/uL (ref 15–500)
Eosinophils Relative: 1.9 %
HCT: 42.8 % (ref 35.0–45.0)
Hemoglobin: 14.4 g/dL (ref 11.7–15.5)
Lymphs Abs: 1177 cells/uL (ref 850–3900)
MCH: 30.5 pg (ref 27.0–33.0)
MCHC: 33.6 g/dL (ref 32.0–36.0)
MCV: 90.7 fL (ref 80.0–100.0)
MPV: 11.1 fL (ref 7.5–12.5)
Monocytes Relative: 12.4 %
Neutro Abs: 1868 cells/uL (ref 1500–7800)
Neutrophils Relative %: 51.9 %
Platelets: 133 10*3/uL — ABNORMAL LOW (ref 140–400)
RBC: 4.72 10*6/uL (ref 3.80–5.10)
RDW: 13.1 % (ref 11.0–15.0)
Total Lymphocyte: 32.7 %
WBC: 3.6 10*3/uL — ABNORMAL LOW (ref 3.8–10.8)

## 2020-11-07 ENCOUNTER — Encounter: Payer: Self-pay | Admitting: *Deleted

## 2020-11-12 ENCOUNTER — Telehealth: Payer: Self-pay

## 2020-11-12 NOTE — Telephone Encounter (Signed)
Patient left voice mail please return call [phone] for lab results.

## 2020-11-12 NOTE — Telephone Encounter (Signed)
Call placed to patient and patient made aware per VM.  

## 2020-11-15 DIAGNOSIS — I10 Essential (primary) hypertension: Secondary | ICD-10-CM | POA: Diagnosis not present

## 2020-11-15 DIAGNOSIS — Z8669 Personal history of other diseases of the nervous system and sense organs: Secondary | ICD-10-CM | POA: Diagnosis not present

## 2020-11-15 DIAGNOSIS — Z6829 Body mass index (BMI) 29.0-29.9, adult: Secondary | ICD-10-CM | POA: Diagnosis not present

## 2020-11-15 DIAGNOSIS — Z8673 Personal history of transient ischemic attack (TIA), and cerebral infarction without residual deficits: Secondary | ICD-10-CM | POA: Diagnosis not present

## 2020-11-15 DIAGNOSIS — G40909 Epilepsy, unspecified, not intractable, without status epilepticus: Secondary | ICD-10-CM | POA: Diagnosis not present

## 2020-11-15 DIAGNOSIS — D72819 Decreased white blood cell count, unspecified: Secondary | ICD-10-CM | POA: Diagnosis not present

## 2020-11-15 DIAGNOSIS — F0390 Unspecified dementia without behavioral disturbance: Secondary | ICD-10-CM | POA: Diagnosis not present

## 2020-11-24 ENCOUNTER — Other Ambulatory Visit: Payer: Self-pay | Admitting: Family Medicine

## 2020-12-19 ENCOUNTER — Other Ambulatory Visit: Payer: Self-pay | Admitting: Family Medicine

## 2021-01-18 ENCOUNTER — Other Ambulatory Visit: Payer: Self-pay | Admitting: Family Medicine

## 2021-07-03 ENCOUNTER — Other Ambulatory Visit: Payer: Self-pay | Admitting: Family Medicine

## 2021-08-03 ENCOUNTER — Other Ambulatory Visit: Payer: Self-pay | Admitting: Family Medicine

## 2021-08-12 DIAGNOSIS — R319 Hematuria, unspecified: Secondary | ICD-10-CM | POA: Diagnosis not present

## 2021-08-12 DIAGNOSIS — R8281 Pyuria: Secondary | ICD-10-CM | POA: Diagnosis not present

## 2021-08-12 DIAGNOSIS — Z6828 Body mass index (BMI) 28.0-28.9, adult: Secondary | ICD-10-CM | POA: Diagnosis not present

## 2021-08-12 DIAGNOSIS — R109 Unspecified abdominal pain: Secondary | ICD-10-CM | POA: Diagnosis not present

## 2021-08-15 DIAGNOSIS — N281 Cyst of kidney, acquired: Secondary | ICD-10-CM | POA: Diagnosis not present

## 2021-08-15 DIAGNOSIS — R8281 Pyuria: Secondary | ICD-10-CM | POA: Diagnosis not present

## 2021-08-15 DIAGNOSIS — R319 Hematuria, unspecified: Secondary | ICD-10-CM | POA: Diagnosis not present

## 2021-08-15 DIAGNOSIS — R109 Unspecified abdominal pain: Secondary | ICD-10-CM | POA: Diagnosis not present

## 2021-08-20 ENCOUNTER — Encounter (HOSPITAL_COMMUNITY): Payer: Self-pay

## 2021-08-20 ENCOUNTER — Emergency Department (HOSPITAL_COMMUNITY)
Admission: EM | Admit: 2021-08-20 | Discharge: 2021-08-20 | Discharge status: Against Medical Advice | Payer: Medicare PPO | Attending: Emergency Medicine | Admitting: Emergency Medicine

## 2021-08-20 ENCOUNTER — Other Ambulatory Visit: Payer: Self-pay

## 2021-08-20 DIAGNOSIS — Z5321 Procedure and treatment not carried out due to patient leaving prior to being seen by health care provider: Secondary | ICD-10-CM | POA: Diagnosis not present

## 2021-08-20 DIAGNOSIS — R109 Unspecified abdominal pain: Secondary | ICD-10-CM | POA: Insufficient documentation

## 2021-08-20 LAB — CBC
HCT: 44.4 % (ref 36.0–46.0)
Hemoglobin: 14.1 g/dL (ref 12.0–15.0)
MCH: 29.3 pg (ref 26.0–34.0)
MCHC: 31.8 g/dL (ref 30.0–36.0)
MCV: 92.1 fL (ref 80.0–100.0)
Platelets: 144 10*3/uL — ABNORMAL LOW (ref 150–400)
RBC: 4.82 MIL/uL (ref 3.87–5.11)
RDW: 14.7 % (ref 11.5–15.5)
WBC: 3.1 10*3/uL — ABNORMAL LOW (ref 4.0–10.5)
nRBC: 0 % (ref 0.0–0.2)

## 2021-08-20 LAB — BASIC METABOLIC PANEL
Anion gap: 7 (ref 5–15)
BUN: 12 mg/dL (ref 8–23)
CO2: 29 mmol/L (ref 22–32)
Calcium: 9.3 mg/dL (ref 8.9–10.3)
Chloride: 101 mmol/L (ref 98–111)
Creatinine, Ser: 0.9 mg/dL (ref 0.44–1.00)
GFR, Estimated: >60 mL/min (ref >60)
Glucose, Bld: 107 mg/dL — ABNORMAL HIGH (ref 70–99)
Potassium: 4.5 mmol/L (ref 3.5–5.1)
Sodium: 137 mmol/L (ref 135–145)

## 2021-08-20 NOTE — ED Triage Notes (Signed)
Complaints of flank and abdominal pain that started 3 weeks ago.

## 2021-08-24 DIAGNOSIS — R350 Frequency of micturition: Secondary | ICD-10-CM | POA: Diagnosis not present

## 2021-08-24 DIAGNOSIS — R3911 Hesitancy of micturition: Secondary | ICD-10-CM | POA: Diagnosis not present

## 2021-08-27 DIAGNOSIS — R350 Frequency of micturition: Secondary | ICD-10-CM | POA: Diagnosis not present

## 2021-08-27 DIAGNOSIS — R8281 Pyuria: Secondary | ICD-10-CM | POA: Diagnosis not present

## 2021-08-27 DIAGNOSIS — N2889 Other specified disorders of kidney and ureter: Secondary | ICD-10-CM | POA: Diagnosis not present

## 2021-09-03 DIAGNOSIS — N3000 Acute cystitis without hematuria: Secondary | ICD-10-CM | POA: Diagnosis not present

## 2021-09-03 DIAGNOSIS — D4102 Neoplasm of uncertain behavior of left kidney: Secondary | ICD-10-CM | POA: Diagnosis not present

## 2021-09-03 DIAGNOSIS — N281 Cyst of kidney, acquired: Secondary | ICD-10-CM | POA: Diagnosis not present

## 2021-09-19 DIAGNOSIS — N2889 Other specified disorders of kidney and ureter: Secondary | ICD-10-CM | POA: Diagnosis not present

## 2021-09-19 DIAGNOSIS — R1011 Right upper quadrant pain: Secondary | ICD-10-CM | POA: Diagnosis not present

## 2021-09-19 DIAGNOSIS — R34 Anuria and oliguria: Secondary | ICD-10-CM | POA: Diagnosis not present

## 2021-09-24 ENCOUNTER — Emergency Department (HOSPITAL_COMMUNITY): Payer: Medicare PPO

## 2021-09-24 ENCOUNTER — Other Ambulatory Visit: Payer: Self-pay

## 2021-09-24 ENCOUNTER — Encounter (HOSPITAL_COMMUNITY): Payer: Self-pay | Admitting: Emergency Medicine

## 2021-09-24 ENCOUNTER — Inpatient Hospital Stay (HOSPITAL_COMMUNITY)
Admission: EM | Admit: 2021-09-24 | Discharge: 2021-09-27 | DRG: 841 | Disposition: A | Discharge status: Home Health | Payer: Medicare PPO | Attending: Internal Medicine | Admitting: Internal Medicine

## 2021-09-24 DIAGNOSIS — Z823 Family history of stroke: Secondary | ICD-10-CM | POA: Diagnosis not present

## 2021-09-24 DIAGNOSIS — K5900 Constipation, unspecified: Secondary | ICD-10-CM | POA: Diagnosis present

## 2021-09-24 DIAGNOSIS — N281 Cyst of kidney, acquired: Secondary | ICD-10-CM | POA: Diagnosis present

## 2021-09-24 DIAGNOSIS — E785 Hyperlipidemia, unspecified: Secondary | ICD-10-CM | POA: Diagnosis present

## 2021-09-24 DIAGNOSIS — R1909 Other intra-abdominal and pelvic swelling, mass and lump: Secondary | ICD-10-CM | POA: Diagnosis present

## 2021-09-24 DIAGNOSIS — R569 Unspecified convulsions: Secondary | ICD-10-CM | POA: Diagnosis present

## 2021-09-24 DIAGNOSIS — I10 Essential (primary) hypertension: Secondary | ICD-10-CM | POA: Diagnosis present

## 2021-09-24 DIAGNOSIS — F03A0 Unspecified dementia, mild, without behavioral disturbance, psychotic disturbance, mood disturbance, and anxiety: Secondary | ICD-10-CM | POA: Diagnosis present

## 2021-09-24 DIAGNOSIS — C8513 Unspecified B-cell lymphoma, intra-abdominal lymph nodes: Secondary | ICD-10-CM | POA: Diagnosis present

## 2021-09-24 DIAGNOSIS — J90 Pleural effusion, not elsewhere classified: Secondary | ICD-10-CM

## 2021-09-24 DIAGNOSIS — E871 Hypo-osmolality and hyponatremia: Secondary | ICD-10-CM | POA: Diagnosis present

## 2021-09-24 DIAGNOSIS — N390 Urinary tract infection, site not specified: Secondary | ICD-10-CM | POA: Diagnosis present

## 2021-09-24 DIAGNOSIS — J910 Malignant pleural effusion: Secondary | ICD-10-CM | POA: Diagnosis present

## 2021-09-24 DIAGNOSIS — R109 Unspecified abdominal pain: Secondary | ICD-10-CM | POA: Diagnosis present

## 2021-09-24 DIAGNOSIS — Z86011 Personal history of benign neoplasm of the brain: Secondary | ICD-10-CM

## 2021-09-24 DIAGNOSIS — D696 Thrombocytopenia, unspecified: Secondary | ICD-10-CM | POA: Diagnosis present

## 2021-09-24 DIAGNOSIS — R079 Chest pain, unspecified: Secondary | ICD-10-CM | POA: Diagnosis not present

## 2021-09-24 DIAGNOSIS — R1900 Intra-abdominal and pelvic swelling, mass and lump, unspecified site: Secondary | ICD-10-CM

## 2021-09-24 DIAGNOSIS — D492 Neoplasm of unspecified behavior of bone, soft tissue, and skin: Secondary | ICD-10-CM | POA: Diagnosis not present

## 2021-09-24 DIAGNOSIS — Z833 Family history of diabetes mellitus: Secondary | ICD-10-CM

## 2021-09-24 DIAGNOSIS — C8590 Non-Hodgkin lymphoma, unspecified, unspecified site: Secondary | ICD-10-CM | POA: Diagnosis not present

## 2021-09-24 DIAGNOSIS — C801 Malignant (primary) neoplasm, unspecified: Secondary | ICD-10-CM | POA: Diagnosis present

## 2021-09-24 DIAGNOSIS — Z825 Family history of asthma and other chronic lower respiratory diseases: Secondary | ICD-10-CM | POA: Diagnosis not present

## 2021-09-24 DIAGNOSIS — Z7982 Long term (current) use of aspirin: Secondary | ICD-10-CM

## 2021-09-24 DIAGNOSIS — N820 Vesicovaginal fistula: Secondary | ICD-10-CM | POA: Diagnosis present

## 2021-09-24 DIAGNOSIS — J948 Other specified pleural conditions: Secondary | ICD-10-CM | POA: Diagnosis not present

## 2021-09-24 DIAGNOSIS — Z9889 Other specified postprocedural states: Secondary | ICD-10-CM | POA: Diagnosis not present

## 2021-09-24 DIAGNOSIS — Z800 Family history of malignant neoplasm of digestive organs: Secondary | ICD-10-CM | POA: Diagnosis not present

## 2021-09-24 DIAGNOSIS — D72819 Decreased white blood cell count, unspecified: Secondary | ICD-10-CM | POA: Diagnosis present

## 2021-09-24 DIAGNOSIS — Z8261 Family history of arthritis: Secondary | ICD-10-CM

## 2021-09-24 DIAGNOSIS — Z66 Do not resuscitate: Secondary | ICD-10-CM | POA: Diagnosis present

## 2021-09-24 DIAGNOSIS — J9811 Atelectasis: Secondary | ICD-10-CM | POA: Diagnosis present

## 2021-09-24 DIAGNOSIS — R5381 Other malaise: Secondary | ICD-10-CM | POA: Diagnosis present

## 2021-09-24 DIAGNOSIS — I700 Atherosclerosis of aorta: Secondary | ICD-10-CM | POA: Diagnosis present

## 2021-09-24 DIAGNOSIS — Z20822 Contact with and (suspected) exposure to covid-19: Secondary | ICD-10-CM | POA: Diagnosis present

## 2021-09-24 DIAGNOSIS — R161 Splenomegaly, not elsewhere classified: Secondary | ICD-10-CM | POA: Diagnosis present

## 2021-09-24 DIAGNOSIS — Z79899 Other long term (current) drug therapy: Secondary | ICD-10-CM

## 2021-09-24 DIAGNOSIS — Z87891 Personal history of nicotine dependence: Secondary | ICD-10-CM

## 2021-09-24 DIAGNOSIS — R0602 Shortness of breath: Secondary | ICD-10-CM | POA: Diagnosis not present

## 2021-09-24 DIAGNOSIS — M5137 Other intervertebral disc degeneration, lumbosacral region: Secondary | ICD-10-CM | POA: Diagnosis not present

## 2021-09-24 DIAGNOSIS — C8510 Unspecified B-cell lymphoma, unspecified site: Secondary | ICD-10-CM | POA: Diagnosis not present

## 2021-09-24 DIAGNOSIS — Z8673 Personal history of transient ischemic attack (TIA), and cerebral infarction without residual deficits: Secondary | ICD-10-CM

## 2021-09-24 LAB — BASIC METABOLIC PANEL
Anion gap: 11 (ref 5–15)
BUN: 12 mg/dL (ref 8–23)
CO2: 24 mmol/L (ref 22–32)
Calcium: 9.2 mg/dL (ref 8.9–10.3)
Chloride: 99 mmol/L (ref 98–111)
Creatinine, Ser: 0.98 mg/dL (ref 0.44–1.00)
GFR, Estimated: 59 mL/min — ABNORMAL LOW (ref >60)
Glucose, Bld: 103 mg/dL — ABNORMAL HIGH (ref 70–99)
Potassium: 4.1 mmol/L (ref 3.5–5.1)
Sodium: 134 mmol/L — ABNORMAL LOW (ref 135–145)

## 2021-09-24 LAB — CBC
HCT: 47.5 % — ABNORMAL HIGH (ref 36.0–46.0)
Hemoglobin: 15.4 g/dL — ABNORMAL HIGH (ref 12.0–15.0)
MCH: 29.5 pg (ref 26.0–34.0)
MCHC: 32.4 g/dL (ref 30.0–36.0)
MCV: 91 fL (ref 80.0–100.0)
Platelets: 148 10*3/uL — ABNORMAL LOW (ref 150–400)
RBC: 5.22 MIL/uL — ABNORMAL HIGH (ref 3.87–5.11)
RDW: 14.6 % (ref 11.5–15.5)
WBC: 3.1 10*3/uL — ABNORMAL LOW (ref 4.0–10.5)
nRBC: 0 % (ref 0.0–0.2)

## 2021-09-24 NOTE — ED Triage Notes (Signed)
Patient reports being sick for a while. States over the past few days has been having chest pain and shortness of breath that has become unbearable. Patient reports pain started on her right side, was evaluated by her PCP and given pain medication and a CT scheduled but could not get one for over a month.

## 2021-09-24 NOTE — ED Notes (Signed)
X1 vitals no response

## 2021-09-24 NOTE — ED Provider Triage Note (Signed)
Emergency Medicine Provider Triage Evaluation Note  Molly Patel , a 76 y.o. female  was evaluated in triage.  Pt complains of chest pain, SOB, and right flank pain. Patient has had flank pain for over a month. She was seen at her PCP who got an ultrasound and the radiologist recommended further imaging with CT renal protocol. Due to scheduling, this has still not been done. Now she is complaining of central chest pain, worse with deep breathing.  Review of Systems  Positive: CP, SOB, right flank pain, decreased urine, constipation Negative: Fever, chills, cough, hematuria  Physical Exam  BP (!) 152/102 (BP Location: Left Arm)    Pulse 100    Temp 97.9 F (36.6 C) (Oral)    Resp 16    Ht 5\' 7"  (1.702 m)    Wt 83.9 kg    SpO2 97%    BMI 28.98 kg/m  Gen:   Awake, no distress   Resp:  Normal effort  MSK:   Moves extremities without difficulty  Other:    Medical Decision Making  Medically screening exam initiated at 3:38 PM.  Appropriate orders placed.  ELIANIE HUBERS was informed that the remainder of the evaluation will be completed by another provider, this initial triage assessment does not replace that evaluation, and the importance of remaining in the ED until their evaluation is complete.     Weylin Plagge T, PA-C 09/24/21 [id number]

## 2021-09-25 ENCOUNTER — Emergency Department (HOSPITAL_COMMUNITY): Payer: Medicare PPO

## 2021-09-25 ENCOUNTER — Observation Stay (HOSPITAL_COMMUNITY): Payer: Medicare PPO

## 2021-09-25 DIAGNOSIS — R109 Unspecified abdominal pain: Secondary | ICD-10-CM

## 2021-09-25 DIAGNOSIS — Z9889 Other specified postprocedural states: Secondary | ICD-10-CM

## 2021-09-25 DIAGNOSIS — J90 Pleural effusion, not elsewhere classified: Secondary | ICD-10-CM | POA: Diagnosis not present

## 2021-09-25 LAB — BODY FLUID CELL COUNT WITH DIFFERENTIAL
Eos, Fluid: 0 %
Lymphs, Fluid: 96 %
Monocyte-Macrophage-Serous Fluid: 0 % — ABNORMAL LOW (ref 50–90)
Neutrophil Count, Fluid: 4 % (ref 0–25)
Total Nucleated Cell Count, Fluid: 2668 cu mm — ABNORMAL HIGH (ref 0–1000)

## 2021-09-25 LAB — URINALYSIS, MICROSCOPIC (REFLEX)

## 2021-09-25 LAB — URINALYSIS, ROUTINE W REFLEX MICROSCOPIC
Bilirubin Urine: NEGATIVE
Glucose, UA: NEGATIVE mg/dL
Ketones, ur: NEGATIVE mg/dL
Nitrite: NEGATIVE
Protein, ur: NEGATIVE mg/dL
Specific Gravity, Urine: <1.005 — ABNORMAL LOW (ref 1.005–1.030)
pH: 6.5 (ref 5.0–8.0)

## 2021-09-25 LAB — RESP PANEL BY RT-PCR (FLU A&B, COVID) ARPGX2
Influenza A by PCR: NEGATIVE
Influenza B by PCR: NEGATIVE
SARS Coronavirus 2 by RT PCR: NEGATIVE

## 2021-09-25 LAB — LACTATE DEHYDROGENASE, PLEURAL OR PERITONEAL FLUID: LD, Fluid: 638 U/L — ABNORMAL HIGH (ref 3–23)

## 2021-09-25 LAB — LACTATE DEHYDROGENASE: LDH: 355 U/L — ABNORMAL HIGH (ref 98–192)

## 2021-09-25 LAB — PROTEIN, PLEURAL OR PERITONEAL FLUID: Total protein, fluid: 4.6 g/dL

## 2021-09-25 LAB — CHOLESTEROL, TOTAL: Cholesterol: 215 mg/dL — ABNORMAL HIGH (ref 0–200)

## 2021-09-25 LAB — PROTEIN, TOTAL: Total Protein: 6 g/dL — ABNORMAL LOW (ref 6.5–8.1)

## 2021-09-25 MED ORDER — TRAMADOL HCL 50 MG PO TABS
50.0000 mg | ORAL_TABLET | Freq: Four times a day (QID) | ORAL | Status: DC | PRN
  Administered 2021-09-26 – 2021-09-27 (×2): 50 mg via ORAL
  Filled 2021-09-25 (×2): qty 1

## 2021-09-25 MED ORDER — ACETAMINOPHEN 325 MG PO TABS: 650.0000 mg | ORAL_TABLET | Freq: Four times a day (QID) | ORAL | Status: DC | PRN

## 2021-09-25 MED ORDER — SENNOSIDES-DOCUSATE SODIUM 8.6-50 MG PO TABS
2.0000 | ORAL_TABLET | Freq: Two times a day (BID) | ORAL | Status: DC
  Administered 2021-09-26 – 2021-09-27 (×3): 2 via ORAL
  Filled 2021-09-25 (×5): qty 2

## 2021-09-25 MED ORDER — IOHEXOL 350 MG/ML SOLN
53.0000 mL | Freq: Once | INTRAVENOUS | Status: AC | PRN
  Administered 2021-09-25: 53 mL via INTRAVENOUS

## 2021-09-25 MED ORDER — ACETAMINOPHEN 650 MG RE SUPP: 650.0000 mg | Freq: Four times a day (QID) | RECTAL | Status: DC | PRN

## 2021-09-25 MED ORDER — POLYETHYLENE GLYCOL 3350 17 G PO PACK
17.0000 g | PACK | Freq: Every day | ORAL | Status: DC
  Administered 2021-09-26 – 2021-09-27 (×2): 17 g via ORAL
  Filled 2021-09-25 (×4): qty 1

## 2021-09-25 MED ORDER — MEMANTINE HCL 10 MG PO TABS
5.0000 mg | ORAL_TABLET | Freq: Every day | ORAL | Status: DC
Start: 2021-09-25 — End: 2021-09-27
  Administered 2021-09-25 – 2021-09-27 (×3): 5 mg via ORAL
  Filled 2021-09-25 (×5): qty 1

## 2021-09-25 MED ORDER — ONDANSETRON HCL 4 MG/2ML IJ SOLN: 4.0000 mg | Freq: Four times a day (QID) | INTRAMUSCULAR | Status: DC | PRN

## 2021-09-25 MED ORDER — ENOXAPARIN SODIUM 40 MG/0.4ML IJ SOSY: 40.0000 mg | PREFILLED_SYRINGE | INTRAMUSCULAR | Status: DC

## 2021-09-25 MED ORDER — ONDANSETRON HCL 4 MG PO TABS: 4.0000 mg | ORAL_TABLET | Freq: Four times a day (QID) | ORAL | Status: DC | PRN

## 2021-09-25 MED ORDER — LACTATED RINGERS IV BOLUS
500.0000 mL | Freq: Once | INTRAVENOUS | Status: AC
  Administered 2021-09-25: 500 mL via INTRAVENOUS

## 2021-09-25 MED ORDER — MORPHINE SULFATE (PF) 4 MG/ML IV SOLN
3.0000 mg | Freq: Once | INTRAVENOUS | Status: DC
  Filled 2021-09-25: qty 1

## 2021-09-25 MED ORDER — LEVETIRACETAM 500 MG PO TABS
500.0000 mg | ORAL_TABLET | Freq: Two times a day (BID) | ORAL | Status: DC
  Administered 2021-09-25 – 2021-09-27 (×5): 500 mg via ORAL
  Filled 2021-09-25 (×5): qty 1

## 2021-09-25 MED ORDER — DONEPEZIL HCL 5 MG PO TABS
5.0000 mg | ORAL_TABLET | Freq: Every day | ORAL | Status: DC
  Administered 2021-09-25 – 2021-09-26 (×2): 5 mg via ORAL
  Filled 2021-09-25 (×2): qty 1

## 2021-09-25 MED ORDER — ONDANSETRON HCL 4 MG/2ML IJ SOLN
4.0000 mg | Freq: Once | INTRAMUSCULAR | Status: DC
  Filled 2021-09-25: qty 2

## 2021-09-25 NOTE — ED Notes (Signed)
Pt reports 0/10 pain scale at this time. Instructed to call RN when pt pain comes back. Will continue to monitor.

## 2021-09-25 NOTE — ED Provider Notes (Signed)
[facility] EMERGENCY DEPARTMENT Provider Note  CSN: [id number] Arrival date & time: 09/24/21 1525  Chief Complaint(s) Chest Pain  HPI LANISHA STEPANIAN is a 76 y.o. female who presents the emergency department for evaluation of flank pain, shortness of breath and chest pain.  Patient states that she has had the symptoms gradually worsening for the last 2 months.  She reportedly received a renal ultrasound that showed a renal cyst for her flank pain and she was instructed to follow-up outpatient.  Patient had a difficult time following up outpatient due to the holiday season and as her symptoms have worsened she returns the emergency department today.  She endorses persistent fatigue and difficulty with urination.  Denies vomiting, headache, fever or other systemic symptoms.   Chest Pain Associated symptoms: abdominal pain    Past Medical History Past Medical History:  Diagnosis Date   Angioedema    Depression    Dizziness    Echocardiogram with ECG monitoring july 2012   mild LVH, normal event monitor    Hyperlipidemia    Hypertension    takes meds daily   Incontinence    Insomnia    Ovarian cyst    Seizures ([place])    2 weeks ago   Stroke [facility]) 2016   Syncope    Vitamin D deficiency    Patient Active Problem List   Diagnosis Date Noted   Glaucoma 09/21/2018   Full code status 09/21/2018   Cognitive changes 09/21/2017   History of CVA (cerebrovascular accident) 05/28/2015   Risk for falls 04/24/2014   Nasal congestion 12/16/2013   Hyperlipidemia 08/15/2013   S/P craniotomy 08/15/2013   Dry eye 09/16/2012   Insomnia 09/16/2012   OAB (overactive bladder) 09/16/2012   Lumbar stenosis 05/16/2012   Knee pain 10/06/2011   Dyspnea 04/24/2011   Hypertension    Home Medication(s) Prior to Admission medications   Medication Sig Start Date End Date Taking? Authorizing Provider  aspirin EC 325 MG tablet Take 1 tablet (325 mg total) by mouth daily. 05/28/15    Alycia Rossetti, MD  atenolol (TENORMIN) 50 MG tablet TAKE 1 TABLET EVERY DAY 08/30/20   Alycia Rossetti, MD  atorvastatin (LIPITOR) 10 MG tablet TAKE 1 TABLET EVERY DAY 12/05/19   Alycia Rossetti, MD  cholecalciferol (VITAMIN D) 25 MCG (1000 UNIT) tablet TAKE 1 TABLET EVERY DAY 10/24/19   Alycia Rossetti, MD  donepezil (ARICEPT) 5 MG tablet TAKE 1 TABLET AT BEDTIME 08/09/20   [place], Modena Nunnery, MD  hydrochlorothiazide (MICROZIDE) 12.5 MG capsule TAKE 1 CAPSULE EVERY DAY 01/18/21   Susy Frizzle, MD  levETIRAcetam (KEPPRA) 500 MG tablet Take 1 tablet (500 mg total) by mouth 2 (two) times daily. Needs to establish with new PCP.  Dr. Buelah Manis is no longer at this office.  Must be seen before more refills. 08/05/21   Susy Frizzle, MD  Past Surgical History Past Surgical History:  Procedure Laterality Date   ABDOMINAL HYSTERECTOMY     COLONOSCOPY N/A 11/02/2014   Procedure: COLONOSCOPY;  Surgeon: Rogene Houston, MD;  Location: [facility];  Service: Endoscopy;  Laterality: N/A;  140 - moved to 12:55 - Ann to notify pt   CRANIOTOMY  06/29/2012   Procedure: CRANIOTOMY TUMOR EXCISION;  Surgeon: Faythe Ghee, MD;  Location: [place] [facility] OR[facility];  Service: Neurosurgery;  Laterality: Left;  Left frontal Craniotomy for Meningioma   ESOPHAGEAL DILATION N/A 11/02/2014   Procedure: ESOPHAGEAL DILATION;  Surgeon: Rogene Houston, MD;  Location: [facility];  Service: Endoscopy;  Laterality: N/A;   ESOPHAGOGASTRODUODENOSCOPY N/A 11/02/2014   Procedure: ESOPHAGOGASTRODUODENOSCOPY (EGD);  Surgeon: Rogene Houston, MD;  Location: [facility];  Service: Endoscopy;  Laterality: N/A;   VESICOVAGINAL FISTULA CLOSURE W/ TAH     Family History Family History  Problem Relation Age of Onset   Stroke Father    Asthma Grandchild    Arthritis Sister    Diabetes Sister    Colon  cancer Brother     Social History Social History   Tobacco Use   Smoking status: Former    Years: 7.00    Types: Cigarettes    Quit date: 08/25/1985    Years since quitting: 36.1   Smokeless tobacco: Never  Substance Use Topics   Alcohol use: No   Drug use: No   Allergies Patient has no known allergies.  Review of Systems Review of Systems  Cardiovascular:  Positive for chest pain.  Gastrointestinal:  Positive for abdominal pain.   Physical Exam Vital Signs  I have reviewed the triage vital signs BP (!) 145/88 (BP Location: Right Arm)    Pulse 87    Temp 98.2 F (36.8 C) (Oral)    Resp 16    Ht 5\' 7"  (1.702 m)    Wt 83.9 kg    SpO2 95%    BMI 28.98 kg/m   Physical Exam Vitals and nursing note reviewed.  Constitutional:      General: She is not in acute distress.    Appearance: She is well-developed.  HENT:     Head: Normocephalic and atraumatic.  Eyes:     Conjunctiva/sclera: Conjunctivae normal.  Cardiovascular:     Rate and Rhythm: Normal rate and regular rhythm.     Heart sounds: No murmur heard. Pulmonary:     Effort: Pulmonary effort is normal. No respiratory distress.     Breath sounds: Examination of the right-upper field reveals decreased breath sounds. Examination of the right-middle field reveals decreased breath sounds. Examination of the right-lower field reveals decreased breath sounds. Decreased breath sounds present.  Abdominal:     General: There is abdominal bruit.     Palpations: Abdomen is soft.     Tenderness: There is no abdominal tenderness.  Musculoskeletal:        General: No swelling.     Cervical back: Neck supple.  Skin:    General: Skin is warm and dry.     Capillary Refill: Capillary refill takes less than 2 seconds.  Neurological:     Mental Status: She is alert.  Psychiatric:        Mood and Affect: Mood normal.    ED Results and Treatments Labs (all labs ordered are listed, but only abnormal results are displayed) Labs  Reviewed  BASIC METABOLIC PANEL - Abnormal; Notable for the following components:  Result Value   Sodium 134 (*)    Glucose, Bld 103 (*)    GFR, Estimated 59 (*)    All other components within normal limits  CBC - Abnormal; Notable for the following components:   WBC 3.1 (*)    RBC 5.22 (*)    Hemoglobin 15.4 (*)    HCT 47.5 (*)    Platelets 148 (*)    All other components within normal limits  URINALYSIS, ROUTINE W REFLEX MICROSCOPIC  TROPONIN I (HIGH SENSITIVITY)                                                                                                                          Radiology DG Chest 2 View  Result Date: 09/24/2021 CLINICAL DATA:  A 76 year old female presents with chest pain and shortness of breath. EXAM: CHEST - 2 VIEW COMPARISON:  June 29, 2012. Also with CT of the abdomen and pelvis of September 24, 2021. FINDINGS: Trachea is midline. Cardiomediastinal contours and hilar structures with obscured RIGHT hilum. Relatively normal appearing LEFT hilum but with widening of the paraspinal stripe in the LEFT chest likely due to paraspinal soft tissue seen on the previous CT of the abdomen and pelvis. Dense opacification of the lower 2/3 of the RIGHT hemithorax. LEFT chest with relatively normal appearance, no opacity or effusion other than some thickening of the paraspinal stripe on the LEFT. No pneumothorax. On limited assessment there is no acute skeletal process. IMPRESSION: 1. Dense opacification of 2/3 of the RIGHT hemithorax, inferior RIGHT chest in this patient with known malignant appearing RIGHT-sided effusion and basilar airspace disease. 2. Mild thickening of the paraspinal stripe on the LEFT likely due to soft tissue along the inferior thoracic spine. CT imaging of the chest may be helpful for further evaluation. Electronically Signed   By: Zetta Bills M.D.   On: 09/24/2021 18:16   CT Angio Chest PE W and/or Wo Contrast  Result Date: 09/25/2021 CLINICAL  DATA:  Chest pain, shortness of breath EXAM: CT ANGIOGRAPHY CHEST WITH CONTRAST TECHNIQUE: Multidetector CT imaging of the chest was performed using the standard protocol during bolus administration of intravenous contrast. Multiplanar CT image reconstructions and MIPs were obtained to evaluate the vascular anatomy. RADIATION DOSE REDUCTION: This exam was performed according to the departmental dose-optimization program which includes automated exposure control, adjustment of the mA and/or kV according to patient size and/or use of iterative reconstruction technique. CONTRAST:  60mL OMNIPAQUE IOHEXOL 350 MG/ML SOLN COMPARISON:  CT abdomen 09/24/2021 FINDINGS: Cardiovascular: Satisfactory opacification of the pulmonary arteries to the segmental level. No evidence of pulmonary embolism. Normal heart size. No pericardial effusion. Mediastinum/Nodes: Mediastinum is shifted towards the left. No enlarged mediastinal, hilar, or axillary lymph nodes. Thyroid gland, trachea, and esophagus demonstrate no significant findings. Large right retroperitoneal soft tissue mass measuring 4.4 x 5.8 x 7 cm which extends superiorly along the right para-aortic region through the diaphragmatic hiatus into the thorax and pleural space. Lungs/Pleura: Large  right pleural effusion with partial collapse of the right lung with only a small area of aerated right upper lobe remaining. Severe soft tissue pleural thickening along the posterior aspect of the right pleural space measuring 2.8 cm in maximal thickness extending into the posterior mediastinum. 4.2 x 3.1 cm right pleural based mass along the anteromedial aspect abutting the pericardium. Upper Abdomen: No acute abnormality. Musculoskeletal: No acute osseous abnormality. No aggressive osseous lesion. Right posterior pleural mass abuts the mid and lower thoracic spine. Review of the MIP images confirms the above findings. IMPRESSION: 1. No acute pulmonary embolus. 2. Large right pleural  effusion with partial collapse of the right lung with only a small area of aerated right upper lobe remaining. 3. Partially visualized, large right retroperitoneal soft tissue mass measuring 4.4 x 5.8 x 7 cm which extends superiorly along the right para-aortic region through the diaphragmatic hiatus into the thorax and pleural space. Severe soft tissue pleural thickening along the posterior aspect of the right pleural space measuring 2.8 cm in maximum thickness. 4.2 x 3.1 cm right pleural based mass along the anteromedial aspect abutting the pericardium. Findings are concerning for malignancy. Electronically Signed   By: Kathreen Devoid M.D.   On: 09/25/2021 06:14   CT Renal Stone Study  Result Date: 09/24/2021 CLINICAL DATA:  Right flank pain. EXAM: CT ABDOMEN AND PELVIS WITHOUT CONTRAST TECHNIQUE: Multidetector CT imaging of the abdomen and pelvis was performed following the standard protocol without IV contrast. RADIATION DOSE REDUCTION: This exam was performed according to the departmental dose-optimization program which includes automated exposure control, adjustment of the mA and/or kV according to patient size and/or use of iterative reconstruction technique. COMPARISON:  None. FINDINGS: Lower chest: There is a moderate right pleural effusion. A soft tissue rind is identified overlying which overlies the posterior and medial right lung with overlying moderate right pleural effusion. Tumor rind extends into the posterior mediastinum, image 16/3. Hepatobiliary: No focal liver abnormality identified. Gallbladder appears normal. No bile duct dilatation. Pancreas: Pancreas is unremarkable. Spleen: Spleen measures 9.8 cm in cranial caudal dimension. No focal splenic lesion. Adrenals/Urinary Tract: Normal appearance of the adrenal glands. No signs of nephrolithiasis or hydronephrosis. Urinary bladder appears normal for degree of distension. Stomach/Bowel: Stomach appears within normal limits. The appendix is not  confidently identified. No bowel wall thickening, inflammation, or distension. Vascular/Lymphatic: Aortic atherosclerosis without aneurysm. Large right retroperitoneal nodal mass measures 4.4 by 5.7 by 7.1 cm, image 31/3 and image 71/3. Within the central small bowel mesentery there is a conglomeration of enlarged lymph nodes/mass which measures 2.1 x 6.3 by 7.1 cm, image 48/3 and image 36/6. Right pelvic sidewall lymph node is borderline enlarged measuring 1 cm, image 70/3. Reproductive: The uterus appears surgically absent. Indeterminate ovoid mass within the left adnexal region measures 3.7 x 2.1 cm, image 69/3. Other: No significant free fluid identified. Musculoskeletal: Degenerative disc disease noted at L5-S1. No aggressive lytic or sclerotic bone lesions. IMPRESSION: 1. Large nodal masses identified within right retroperitoneum and small bowel mesentery. Findings are concerning for either lymphoproliferative disorder or metastatic disease. Additionally, there is a rind of increased soft tissue overlying the posteromedial right lower lobe with extension into the posterior mediastinum. This is also worrisome for malignancy. Further evaluation with PET-CT and tissue sampling is recommended. 2. A moderate right pleural effusion overlies the soft tissue rind within the right hemithorax. Consider further evaluation with diagnostic thoracentesis. 3. Indeterminate ovoid mass within the left adnexal region measures 3.7 x 2.1 cm.  Attention in this area on PET-CT is advised. 4. Borderline splenomegaly. 5. Aortic Atherosclerosis (ICD10-I70.0). Electronically Signed   By: Kerby Moors M.D.   On: 09/24/2021 16:58    Pertinent labs & imaging results that were available during my care of the patient were reviewed by me and considered in my medical decision making (see MDM for details).  Medications Ordered in ED Medications  iohexol (OMNIPAQUE) 350 MG/ML injection 53 mL (53 mLs Intravenous Contrast Given 09/25/21  0543)                                                                                                                                     Procedures Procedures  (including critical care time)  Medical Decision Making / ED Course   This patient presents to the ED for concern of chest pain, abdominal pain, this involves an extensive number of treatment options, and is a complaint that carries with it a high risk of complications and morbidity.  The differential diagnosis includes ACS, pulmonary embolism, carcinoma, metastatic disease, pleural effusions  MDM: Patient seen emergency department for evaluation multiple complaints as described above.  Physical exam reveals an ill-appearing patient with decreased breath sounds on the right, generalized abdominal tenderness.  Laboratory evaluation with mild hyponatremia 134, leukopenia to 3.1, thrombocytopenia to 148.  Chest x-ray with Spectam pleural effusion on the right.  CT stone study with multiple intra-abdominal masses with high concern for metastatic disease.  CT angio PE revealing additional masses and right-sided pleural effusion.  Patient presentation consistent with metastatic cancer of unknown origin and she will be admitted for further work-up.  Patient maintained her oxygen saturations without difficulty but will likely require thoracentesis in the hospital.  Patient admitted.   Additional history obtained: -Additional history obtained from niece -External records from outside source obtained and reviewed including: Chart review including previous notes, labs, imaging, consultation notes   Lab Tests: -I ordered, reviewed, and interpreted labs.   The pertinent results include:   Labs Reviewed  BASIC METABOLIC PANEL - Abnormal; Notable for the following components:      Result Value   Sodium 134 (*)    Glucose, Bld 103 (*)    GFR, Estimated 59 (*)    All other components within normal limits  CBC - Abnormal; Notable for the  following components:   WBC 3.1 (*)    RBC 5.22 (*)    Hemoglobin 15.4 (*)    HCT 47.5 (*)    Platelets 148 (*)    All other components within normal limits  URINALYSIS, ROUTINE W REFLEX MICROSCOPIC  TROPONIN I (HIGH SENSITIVITY)      EKG   EKG Interpretation  Date/Time:    Ventricular Rate:    PR Interval:    QRS Duration:   QT Interval:    QTC Calculation:   R Axis:     Text Interpretation:  Imaging Studies ordered: I ordered imaging studies including chest x-ray, CT stone study, CT PE I independently visualized and interpreted imaging. I agree with the radiologist interpretation   Medicines ordered and prescription drug management: Meds ordered this encounter  Medications   iohexol (OMNIPAQUE) 350 MG/ML injection 53 mL    -I have reviewed the patients home medicines and have made adjustments as needed  Critical interventions none   Cardiac Monitoring: The patient was maintained on a cardiac monitor.  I personally viewed and interpreted the cardiac monitored which showed an underlying rhythm of: NSR   Reevaluation: After the interventions noted above, I reevaluated the patient and found that they have :stayed the same  Co morbidities that complicate the patient evaluation  Past Medical History:  Diagnosis Date   Angioedema    Depression    Dizziness    Echocardiogram with ECG monitoring july 2012   mild LVH, normal event monitor    Hyperlipidemia    Hypertension    takes meds daily   Incontinence    Insomnia    Ovarian cyst    Seizures ([place])    2 weeks ago   Stroke [facility]) 2016   Syncope    Vitamin D deficiency       Dispostion: I considered admission for this patient, and due to a new diagnosis of metastatic disease of unknown origin with pleural effusion she will require hospital admission.     Final Clinical Impression(s) / ED Diagnoses Final diagnoses:  None     @PCDICTATION @    Teressa Lower, MD 09/25/21  1043

## 2021-09-25 NOTE — H&P (Signed)
NAME:  Molly Patel, MRN:  [id number], DOB:  1946-08-16, LOS: 0 ADMISSION DATE:  09/24/2021, Primary: Pcp, No  CHIEF COMPLAINT:  flank pain   Medical Service: Internal Medicine Teaching Service         Attending Physician: Dr. Lottie Mussel, MD    First Contact: Dr. Marlou Sa Pager: [phone]  Second Contact: Dr. Johnney Ou Pager: [phone]       After Hours (After 5p/  First Contact Pager: [phone]  weekends / holidays): Second Contact Pager: [phone]    History of present illness   Molly Patel is a 76 year old female with hypertension, hyperlipidemia, dementia, and history of stroke who presented to [facility] emergency department for right flank pain. Her niece is present at bedside and contributes to portions of the HPI.  Sheliah notes that she developed suprapubic pain and dysuria around second week of December.  She received a course of antibiotics however symptoms persisted and she developed difficulty with bladder emptying.  Her PCP obtained a renal ultrasound which showed a cyst on her kidney.  Her PCP referred her to urology.  She had an appointment with them on 1/10 at which time she was under the impression that a CAT scan would be ordered.  Around 2 weeks ago, she started to develop right flank pain and still had not heard anything about the CAT scan so she called the urology office and was told that it had not been ordered yet but then order for it would be placed.  She is frustrated about this confusion and decided not to return to the urology office.  Right flank pain has slowly progressed over these last 2 weeks, with that now ranging from a 3-9 out of 10.  She reports that she can only urinate a small amount however continues to feel like her bladder is full.  Denies gross hematuria.  She also notes roughly a 2-week history of progressive dyspnea, fatigue, and decreased appetite.  Prior to December, it sounds like she had an excellent functional status, being able to walk a  fair distance without developing shortness of breath.  Denies orthopnea.  No cough.  She reports a 3d history of constipation. She started taking "Perdiem" at home yesterday without relief. She is passing gas. Denies nausea or vomiting.  She has a history of cigarette smoking, although she is not able to quantify the amount.  She notes that she quit over 30 years ago.  Past Medical History  She,  has a past medical history of Angioedema, Depression, Dizziness, Echocardiogram with ECG monitoring (july 2012), Hyperlipidemia, Hypertension, Incontinence, Insomnia, Ovarian cyst, Seizures ([place]), Stroke ([place] (2016), Syncope, and Vitamin D deficiency.   Home Medications     Prior to Admission medications   Medication Sig Start Date End Date Taking? Authorizing Provider  aspirin EC 325 MG tablet Take 1 tablet (325 mg total) by mouth daily. 05/28/15  Yes [place], Modena Nunnery, MD  cholecalciferol (VITAMIN D) 25 MCG (1000 UNIT) tablet TAKE 1 TABLET EVERY DAY Patient taking differently: 1,000 Units daily. 10/24/19  Yes [place], Modena Nunnery, MD  donepezil (ARICEPT) 5 MG tablet TAKE 1 TABLET AT BEDTIME Patient taking differently: Take 5 mg by mouth at bedtime. 08/09/20  Yes [place], Modena Nunnery, MD  hydrochlorothiazide (MICROZIDE) 12.5 MG capsule TAKE 1 CAPSULE EVERY DAY Patient taking differently: Take 12.5 mg by mouth daily. 01/18/21  Yes Susy Frizzle, MD  levETIRAcetam (KEPPRA) 500 MG tablet Take 1 tablet (500 mg total) by  mouth 2 (two) times daily. Needs to establish with new PCP.  Dr. Buelah Manis is no longer at this office.  Must be seen before more refills. 08/05/21  Yes Susy Frizzle, MD  memantine (NAMENDA) 5 MG tablet Take 5 mg by mouth daily. 08/09/21  Yes [provider]  rosuvastatin (CRESTOR) 20 MG tablet Take 20 mg by mouth once a week. Thursday 07/22/21  Yes [provider]  tamsulosin (FLOMAX) 0.4 MG CAPS capsule Take 0.4 mg by mouth daily. 08/15/21  Yes [provider]   traMADol (ULTRAM) 50 MG tablet Take 50 mg by mouth 3 (three) times daily as needed for pain. 09/19/21  Yes [provider]  atenolol (TENORMIN) 50 MG tablet TAKE 1 TABLET EVERY DAY Patient taking differently: Take 50 mg by mouth daily. 08/30/20   Alycia Rossetti, MD    Allergies    Allergies as of 09/24/2021   (No Known Allergies)    Social History   reports that she quit smoking about 36 years ago. Her smoking use included cigarettes. She has never used smokeless tobacco. She reports that she does not drink alcohol and does not use drugs.   Family History   Her family history includes Arthritis in her sister; Asthma in her grandchild; Colon cancer in her brother; Diabetes in her sister; Stroke in her father.   ROS  10 point review of systems negative unless stated in the HPI.  Objective   Blood pressure 125/77, pulse 83, temperature 98.2 F (36.8 C), temperature source Oral, resp. rate (!) 28, height 5\' 7"  (1.702 m), weight 83.9 kg, SpO2 96 %.    General: well appearing, no distress Eyes: no scleral icterus or conjunctival injection HEENT: no rhinorrhea, dry MM Cardiac: RRR, no LE edema Pulm: breathing comfortably on room air. diminished lung sounds on the right.  GI: abdomen soft, non-distended. Tenderness over the right flank with light palpation. Hypoactive bowel sounds.  Skin: no rash or lesion on limited exam MSK: normal muscle bulk and tone.  Neuro: a/o x4. Significant Diagnostic Tests:   CT renal study Large nodal masses identified within the right retroperitoneum and small bowel mesentery.  Findings are concerning for either lymphoproliferative disorder or metastatic disease.  Additionally there is a rind of increased soft tissue overlying the posterior medial right lower lobe with extension into the posterior mediastinum.  This is also worrisome for malignancy. Moderate right pleural effusion overlies the soft tissue within the right hemithorax Indeterminate  ovoid mass within the left adnexal region measuring 3.7 x 2.1 cm Borderline splenomegaly Aortic atherosclerosis  CTA chest Large right pleural effusion with partial collapse of the right lung with only a small area of herniated right upper lobe remaining Partially visualized left right retroperitoneal mass measuring 4.4 x 5.8 x 7 cm which extends superiorly along the right periaortic region through the diaphragmatic hiatus into the thorax and pleural space.  Severe soft tissue pleural thickening along the posterior aspect of the right pleural space measuring 2.8 cm in thickness.  4.2 x 3.1 cm right pleural-based mass along the anteromedial aspect abutting pericardium.  Findings are concerning for malignancy  Labs    CBC Latest Ref Rng & Units 09/24/2021 08/20/2021 11/06/2020  WBC 4.0 - 10.5 K/uL 3.1(L) 3.1(L) 3.6(L)  Hemoglobin 12.0 - 15.0 g/dL 15.4(H) 14.1 14.4  Hematocrit 36.0 - 46.0 % 47.5(H) 44.4 42.8  Platelets 150 - 400 K/uL 148(L) 144(L) 133(L)   BMP Latest Ref Rng & Units 09/24/2021 08/20/2021 10/03/2020  Glucose 70 - 99 mg/dL 103(H) 107(H) 92  BUN 8 - 23 mg/dL 12 12 15   Creatinine 0.44 - 1.00 mg/dL 0.98 0.90 0.90  BUN/Creat Ratio 6 - 22 (calc) - - NOT APPLICABLE  Sodium 655 - 145 mmol/L 134(L) 137 137  Potassium 3.5 - 5.1 mmol/L 4.1 4.5 3.9  Chloride 98 - 111 mmol/L 99 101 98  CO2 22 - 32 mmol/L 24 29 25   Calcium 8.9 - 10.3 mg/dL 9.2 9.3 9.8    Summary  76 yo female presenting with 6 week history of urinary symptoms with progression to right flank pain, dyspnea, and decreased appetite who was found to have suspected metastatic disease with unknown primary on admission.  Assessment & Plan:  Principal Problem:   Flank pain  Suspected metastatic disease with unknown primary Extension of disease to both sides of the diaphragm make advanced disease likely. Would consider GYN malignancy or lymphoma to be most likely.  Right pleural effusion--suspect exudative, thoracentesis lab  results to confirm malignant nature. S/p 1.4L off today. Urinary symptoms Constipation--suspect this could be malignancy related given the mesentery involvement on CT Mild leukopenia, thrombocytopenia.  Plan Will see if IR is able to find a reasonable location for tissue sampling to determine primary--IR consult placed. Planning for biopsy tomorrow Check serum LDH Follow up pleural fluid studies If patient remains admitted by the time of biopsy results, consult oncology to discuss follow up plans Daily labs Strict I/O, bladder scans prn if no void over 8 hours PRN tramadol for pain  Hypertension, hyperlipidemia. Chronic and stable Hold home medications given poor p.o intake  Dementia. Chronic and stable. Continue namenda and aricept Delirium precautions  History of seizures s/p craniectomy (1990s). No recent seizures Continue Keppra  Best practice:  CODE STATUS: DNR DVT for prophylaxis: lovenox Social considerations/Family communication: son and niece updated at bedside. Dispo: Admit patient to Observation with expected length of stay less than 2 midnights.   Mitzi Hansen, MD Internal Medicine Resident PGY-3 [facility] Pager: [phone] 09/25/2021 2:03 PM

## 2021-09-25 NOTE — Procedures (Signed)
Thoracentesis  Procedure Note  SYANNA REMMERT  [id number]  10/06/1945  Date:09/25/21  Time:12:36 PM   Provider Performing:Pete E Kary Kos   Procedure: Thoracentesis with imaging guidance (35597)  Indication(s) Pleural Effusion  Consent Risks of the procedure as well as the alternatives and risks of each were explained to the patient and/or caregiver.  Consent for the procedure was obtained and is signed in the bedside chart  Anesthesia Topical only with 1% lidocaine    Time Out Verified patient identification, verified procedure, site/side was marked, verified correct patient position, special equipment/implants available, medications/allergies/relevant history reviewed, required imaging and test results available.   Sterile Technique Maximal sterile technique including full sterile barrier drape, hand hygiene, sterile gown, sterile gloves, mask, hair covering, sterile ultrasound probe cover (if used).  Procedure Description Ultrasound was used to identify appropriate pleural anatomy for placement and overlying skin marked.  Area of drainage cleaned and draped in sterile fashion. Lidocaine was used to anesthetize the skin and subcutaneous tissue.  1400 cc's of blood tinged appearing fluid was drained from the right pleural space. Catheter then removed and bandaid applied to site.   Complications/Tolerance None; patient tolerated the procedure well. Chest X-ray is ordered to confirm no post-procedural complication.   EBL Minimal   Specimen(s) Pleural fluid  Erick Colace ACNP-BC [place] Pager # [phone] OR # [phone] if no answer

## 2021-09-25 NOTE — ED Notes (Signed)
Bladder scan result is less than 160cc. IVF LR initiated at this time. Purewick in place.

## 2021-09-25 NOTE — Consult Note (Incomplete)
NAME:  Molly Patel, MRN:  [id number], DOB:  Jan 07, 1946, LOS: 0 ADMISSION DATE:  09/24/2021, CONSULTATION DATE:  *** REFERRING MD:  ***, CHIEF COMPLAINT:  ***   History of Present Illness:  76 yo female presents to [facility] for c/o right flank pain, shortness of breath and chest pain. Patient has had flank pain for over a month. Per patient symptoms became gradually worse over the course of the last 2 months. In December 2022 had a renal US that showed a renal cyst in regards to flank pain and was instructed to follow-up outpatient which was not done due to the holiday season. Patient symptoms have worsen and presents to the ED today with persistent fatigue and difficulty urinating. Patient also has an associated symptom of abdominal pain. Denies vomiting, headache, and fever.   Pertinent  Medical History  HTN, HLD, seizures, CVA, syncope, VitD deficiency, insomnia, angioedema, depression, dizziness,   Significant Hospital Events: Including procedures, antibiotic start and stop dates in addition to other pertinent events   PCCM consulted. CTA chest showed Large right pleural effusion with partial collapse of right lung with small area of aerated. Partially visible large right retroperitoneal soft tissue mass measuring 4.4x5.8x7 which extends superiorly along the right para-aortic region through the diaphragmatic hiatus into the thorax and pleural space. Prep and sterile completed thoracentesis with 1400 ml of pinkish red pleural fluid removed for analysis and to guide treatment. Patient tolerated well. No SOB or acute distress noted.   Interim History / Subjective:  ***  Objective   Blood pressure 95/72, pulse 77, temperature 98.2 F (36.8 C), temperature source Oral, resp. rate 18, height 5\' 7"  (1.702 m), weight 83.9 kg, SpO2 99 %.       No intake or output data in the 24 hours ending 09/25/21 1110 Filed Weights   09/24/21 1531  Weight: 83.9 kg    Examination: General: 76 yo female.  Resting on stretcher. No distress noted.  HENT: NCAT. No JVD Lungs: *** Cardiovascular: *** Abdomen: *** Extremities: *** Neuro: *** GU: ***  Resolved Hospital Problem list   ***  Assessment & Plan:  Present on Admission:  Flank pain    Right flank pain and chest pain likely secondary CTA of chest reveals a large right pleural effusion with partial collapse of right lung.  Plan Thoracentesis completed Pleural fluid pending analysis; exudative vs malignancy PRN supplemental oxygen if needed Repeat CXR   Best Practice (right click and "Reselect all SmartList Selections" daily)   Diet/type: {diet type:25684} DVT prophylaxis: {anticoagulation (Optional):25687} GI prophylaxis: {BS:49675} Lines: {Central Venous Access:25771} Foley:  {Central Venous Access:25691} Code Status:  {Code Status:26939} Last date of multidisciplinary goals of care discussion [***]  Labs   CBC: Recent Labs  Lab 09/24/21 1731  WBC 3.1*  HGB 15.4*  HCT 47.5*  MCV 91.0  PLT 148*    Basic Metabolic Panel: Recent Labs  Lab 09/24/21 1731  NA 134*  K 4.1  CL 99  CO2 24  GLUCOSE 103*  BUN 12  CREATININE 0.98  CALCIUM 9.2   GFR: Estimated Creatinine Clearance: 53.5 mL/min (by C-G formula based on SCr of 0.98 mg/dL). Recent Labs  Lab 09/24/21 1731  WBC 3.1*    Liver Function Tests: No results for input(s): AST, ALT, ALKPHOS, BILITOT, PROT, ALBUMIN in the last 168 hours. No results for input(s): LIPASE, AMYLASE in the last 168 hours. No results for input(s): AMMONIA in the last 168 hours.  ABG    Component Value  Date/Time   PHART 7.381 06/29/2012 1545   PCO2ART 17.0 (LL) 06/29/2012 1545   PO2ART 82.5 06/29/2012 1545   HCO3 9.8 (L) 06/29/2012 1545   TCO2 26 03/28/2013 1339   ACIDBASEDEF 14.5 (H) 06/29/2012 1545   O2SAT 98.5 06/29/2012 1545     Coagulation Profile: No results for input(s): INR, PROTIME in the last 168 hours.  Cardiac Enzymes: No results for input(s):  CKTOTAL, CKMB, CKMBINDEX, TROPONINI in the last 168 hours.  HbA1C: No results found for: HGBA1C  CBG: No results for input(s): GLUCAP in the last 168 hours.  Review of Systems:   ***  Past Medical History:  She,  has a past medical history of Angioedema, Depression, Dizziness, Echocardiogram with ECG monitoring (july 2012), Hyperlipidemia, Hypertension, Incontinence, Insomnia, Ovarian cyst, Seizures ([place]), Stroke ([place]) (2016), Syncope, and Vitamin D deficiency.   Surgical History:   Past Surgical History:  Procedure Laterality Date   ABDOMINAL HYSTERECTOMY     COLONOSCOPY N/A 11/02/2014   Procedure: COLONOSCOPY;  Surgeon: Rogene Houston, MD;  [facility];  Service: Endoscopy;  Laterality: N/A;  140 - moved to 12:55 - Ann to notify pt   CRANIOTOMY  06/29/2012   Procedure: CRANIOTOMY TUMOR EXCISION;  Surgeon: Faythe Ghee, MD;  Location: [facility];  Service: Neurosurgery;  Laterality: Left;  Left frontal Craniotomy for Meningioma   ESOPHAGEAL DILATION N/A 11/02/2014   Procedure: ESOPHAGEAL DILATION;  Surgeon: Rogene Houston, MD;  Location: [facility];  Service: Endoscopy;  Laterality: N/A;   ESOPHAGOGASTRODUODENOSCOPY N/A 11/02/2014   Procedure: ESOPHAGOGASTRODUODENOSCOPY (EGD);  Surgeon: Rogene Houston, MD;  Location: [facility];  Service: Endoscopy;  Laterality: N/A;   VESICOVAGINAL FISTULA CLOSURE W/ TAH       Social History:   reports that she quit smoking about 36 years ago. Her smoking use included cigarettes. She has never used smokeless tobacco. She reports that she does not drink alcohol and does not use drugs.   Family History:  Her family history includes Arthritis in her sister; Asthma in her grandchild; Colon cancer in her brother; Diabetes in her sister; Stroke in her father.   Allergies No Known Allergies   Home Medications  Prior to Admission medications   Medication Sig Start Date End Date Taking? Authorizing Provider  aspirin EC 325 MG  tablet Take 1 tablet (325 mg total) by mouth daily. 05/28/15  Yes [place], Modena Nunnery, MD  cholecalciferol (VITAMIN D) 25 MCG (1000 UNIT) tablet TAKE 1 TABLET EVERY DAY Patient taking differently: 1,000 Units daily. 10/24/19  Yes [place], Modena Nunnery, MD  donepezil (ARICEPT) 5 MG tablet TAKE 1 TABLET AT BEDTIME Patient taking differently: Take 5 mg by mouth at bedtime. 08/09/20  Yes , Modena Nunnery, MD  hydrochlorothiazide (MICROZIDE) 12.5 MG capsule TAKE 1 CAPSULE EVERY DAY Patient taking differently: Take 12.5 mg by mouth daily. 01/18/21  Yes Susy Frizzle, MD  levETIRAcetam (KEPPRA) 500 MG tablet Take 1 tablet (500 mg total) by mouth 2 (two) times daily. Needs to establish with new PCP.  Dr. Buelah Manis is no longer at this office.  Must be seen before more refills. 08/05/21  Yes Susy Frizzle, MD  memantine (NAMENDA) 5 MG tablet Take 5 mg by mouth daily. 08/09/21  Yes [provider]  rosuvastatin (CRESTOR) 20 MG tablet Take 20 mg by mouth once a week. Thursday 07/22/21  Yes [provider]  tamsulosin (FLOMAX) 0.4 MG CAPS capsule Take 0.4 mg by mouth daily. 08/15/21  Yes [provider]  traMADol (ULTRAM) 50 MG tablet Take 50 mg by mouth 3 (three) times daily as needed for pain. 09/19/21  Yes [provider]  atenolol (TENORMIN) 50 MG tablet TAKE 1 TABLET EVERY DAY Patient taking differently: Take 50 mg by mouth daily. 08/30/20   Alycia Rossetti, MD     Critical care time: ***

## 2021-09-25 NOTE — Consult Note (Signed)
NAME:  Molly Patel, MRN:  [id number], DOB:  04-09-1946, LOS: 0 ADMISSION DATE:  09/24/2021, CONSULTATION DATE:  2/1 REFERRING MD:  Cain Sieve , CHIEF COMPLAINT:  pleural effusion    History of Present Illness:  76 year old female who initially presented for evaluation back on 12/27 w/ cc flank pain which she first noted about 3 weeks prior. Apparently seen by PCP who ordered renal US which identified renal cyst and was to get further CT imaging. Scheduling was delayed d/t holiday and in the mean time she began to notice in addition to flank pain increased fatigue, increased shortness of breath and (X side) chest pain which is why she presented to ER  In ER CT renal stone: large nodal masses w/in the right retroperitoneum, and small bowel mesentery, CT chest was also obtained to better describe pulmonary involvement showing:  no PE but did have large right sided effusion w/ associated lung collapse. Severe soft tissue pleural thickening alont the posterior aspect of the pleural space and a 4.2 x 3.1 cm pleural based mass along the anterior-medial aspect abutting the pericardium  Given the high concern for malignancy Pulm was asked to evaluate for right thoracentesis in effort to assist w/ further diagnostics   Pertinent  Medical History  Mild LVH, prior angioedema( ?), depression, HTN, HL, incontinence, insomnia, ovarian cyst, prior stroke, seizures, syncope, vit d def  Significant Hospital Events: Including procedures, antibiotic start and stop dates in addition to other pertinent events   2/1 pulm consulted for right pleural effusion and suspected malignancy   Interim History / Subjective:  C/o flank pain right side Objective   Blood pressure 95/72, pulse 77, temperature 98.2 F (36.8 C), temperature source Oral, resp. rate 18, height 5\' 7"  (1.702 m), weight 83.9 kg, SpO2 99 %.       No intake or output data in the 24 hours ending 09/25/21 1109 Filed Weights   09/24/21 1531  Weight: 83.9  kg    Examination: General: no distress  HENT: NCAT no JVD  Lungs: dec bases. R>L  Cardiovascular: RRR Abdomen: soft  Extremities: no edema  Neuro: awake oriented.  GU: voids   Resolved Hospital Problem list     Assessment & Plan:  Dyspnea  Chest pain  Large right pleural effusion w/ associated atelectasis  Large pleural based lung mass anterior medial aspect of mediastinum  Flank pain  Right retroperitoneal mass with mesenteric involvement  H/o HTN H/o stroke Seizure d/o HL Memory deficits   Pulm problem list  Dyspnea and Chest pain due to Large right pleural effusion w/ associated atelectasis c/b  Large pleural based lung mass anterior medial aspect of mediastinum  -concern for malignancy  Plan Diagnostic and therapeutic thoracentesis     Labs   CBC: Recent Labs  Lab 09/24/21 1731  WBC 3.1*  HGB 15.4*  HCT 47.5*  MCV 91.0  PLT 148*    Basic Metabolic Panel: Recent Labs  Lab 09/24/21 1731  NA 134*  K 4.1  CL 99  CO2 24  GLUCOSE 103*  BUN 12  CREATININE 0.98  CALCIUM 9.2   GFR: Estimated Creatinine Clearance: 53.5 mL/min (by C-G formula based on SCr of 0.98 mg/dL). Recent Labs  Lab 09/24/21 1731  WBC 3.1*    Liver Function Tests: No results for input(s): AST, ALT, ALKPHOS, BILITOT, PROT, ALBUMIN in the last 168 hours. No results for input(s): LIPASE, AMYLASE in the last 168 hours. No results for input(s): AMMONIA in the  last 168 hours.  ABG    Component Value Date/Time   PHART 7.381 06/29/2012 1545   PCO2ART 17.0 (LL) 06/29/2012 1545   PO2ART 82.5 06/29/2012 1545   HCO3 9.8 (L) 06/29/2012 1545   TCO2 26 03/28/2013 1339   ACIDBASEDEF 14.5 (H) 06/29/2012 1545   O2SAT 98.5 06/29/2012 1545     Coagulation Profile: No results for input(s): INR, PROTIME in the last 168 hours.  Cardiac Enzymes: No results for input(s): CKTOTAL, CKMB, CKMBINDEX, TROPONINI in the last 168 hours.  HbA1C: No results found for: HGBA1C  CBG: No  results for input(s): GLUCAP in the last 168 hours.  Review of Systems:   Review of Systems  Constitutional: Negative.   HENT: Negative.    Respiratory:  Positive for shortness of breath.   Cardiovascular:  Positive for chest pain.  Gastrointestinal:  Positive for abdominal pain.  Genitourinary:  Positive for dysuria.  Musculoskeletal:  Positive for back pain.  Skin: Negative.   Neurological: Negative.   Endo/Heme/Allergies: Negative.   Psychiatric/Behavioral: Negative.      Past Medical History:  She,  has a past medical history of Angioedema, Depression, Dizziness, Echocardiogram with ECG monitoring (july 2012), Hyperlipidemia, Hypertension, Incontinence, Insomnia, Ovarian cyst, Seizures ([place]), Stroke ([place]) (2016), Syncope, and Vitamin D deficiency.   Surgical History:   Past Surgical History:  Procedure Laterality Date   ABDOMINAL HYSTERECTOMY     COLONOSCOPY N/A 11/02/2014   Procedure: COLONOSCOPY;  Surgeon: Rogene Houston, MD;  [facility];  Service: Endoscopy;  Laterality: N/A;  140 - moved to 12:55 - Ann to notify pt   CRANIOTOMY  06/29/2012   Procedure: CRANIOTOMY TUMOR EXCISION;  Surgeon: Faythe Ghee, MD;  Location: [facility];  Service: Neurosurgery;  Laterality: Left;  Left frontal Craniotomy for Meningioma   ESOPHAGEAL DILATION N/A 11/02/2014   Procedure: ESOPHAGEAL DILATION;  Surgeon: Rogene Houston, MD;  Location: [facility];  Service: Endoscopy;  Laterality: N/A;   ESOPHAGOGASTRODUODENOSCOPY N/A 11/02/2014   Procedure: ESOPHAGOGASTRODUODENOSCOPY (EGD);  Surgeon: Rogene Houston, MD;  Location: [facility];  Service: Endoscopy;  Laterality: N/A;   VESICOVAGINAL FISTULA CLOSURE W/ TAH       Social History:   reports that she quit smoking about 36 years ago. Her smoking use included cigarettes. She has never used smokeless tobacco. She reports that she does not drink alcohol and does not use drugs.   Family History:  Her family history includes  Arthritis in her sister; Asthma in her grandchild; Colon cancer in her brother; Diabetes in her sister; Stroke in her father.   Allergies No Known Allergies   Home Medications  Prior to Admission medications   Medication Sig Start Date End Date Taking? Authorizing Provider  aspirin EC 325 MG tablet Take 1 tablet (325 mg total) by mouth daily. 05/28/15  Yes Lancaster, Modena Nunnery, MD  cholecalciferol (VITAMIN D) 25 MCG (1000 UNIT) tablet TAKE 1 TABLET EVERY DAY Patient taking differently: 1,000 Units daily. 10/24/19  Yes Funk, Modena Nunnery, MD  donepezil (ARICEPT) 5 MG tablet TAKE 1 TABLET AT BEDTIME Patient taking differently: Take 5 mg by mouth at bedtime. 08/09/20  Yes , Modena Nunnery, MD  hydrochlorothiazide (MICROZIDE) 12.5 MG capsule TAKE 1 CAPSULE EVERY DAY Patient taking differently: Take 12.5 mg by mouth daily. 01/18/21  Yes Susy Frizzle, MD  levETIRAcetam (KEPPRA) 500 MG tablet Take 1 tablet (500 mg total) by mouth 2 (two) times daily. Needs to establish with new PCP.  Dr. Buelah Manis is no longer at this office.  Must be seen before more refills. 08/05/21  Yes Susy Frizzle, MD  memantine (NAMENDA) 5 MG tablet Take 5 mg by mouth daily. 08/09/21  Yes [provider]  rosuvastatin (CRESTOR) 20 MG tablet Take 20 mg by mouth once a week. Thursday 07/22/21  Yes [provider]  tamsulosin (FLOMAX) 0.4 MG CAPS capsule Take 0.4 mg by mouth daily. 08/15/21  Yes [provider]  traMADol (ULTRAM) 50 MG tablet Take 50 mg by mouth 3 (three) times daily as needed for pain. 09/19/21  Yes [provider]  atenolol (TENORMIN) 50 MG tablet TAKE 1 TABLET EVERY DAY Patient taking differently: Take 50 mg by mouth daily. 08/30/20   Alycia Rossetti, MD     Critical care time: NA    Erick Colace ACNP-BC [place] Pager # [phone] OR # [phone] if no answer

## 2021-09-26 ENCOUNTER — Observation Stay (HOSPITAL_COMMUNITY): Payer: Medicare PPO

## 2021-09-26 DIAGNOSIS — J9811 Atelectasis: Secondary | ICD-10-CM | POA: Diagnosis present

## 2021-09-26 DIAGNOSIS — K5900 Constipation, unspecified: Secondary | ICD-10-CM | POA: Diagnosis present

## 2021-09-26 DIAGNOSIS — I10 Essential (primary) hypertension: Secondary | ICD-10-CM | POA: Diagnosis present

## 2021-09-26 DIAGNOSIS — Z823 Family history of stroke: Secondary | ICD-10-CM | POA: Diagnosis not present

## 2021-09-26 DIAGNOSIS — N390 Urinary tract infection, site not specified: Secondary | ICD-10-CM | POA: Diagnosis present

## 2021-09-26 DIAGNOSIS — C8513 Unspecified B-cell lymphoma, intra-abdominal lymph nodes: Secondary | ICD-10-CM | POA: Diagnosis present

## 2021-09-26 DIAGNOSIS — F03A0 Unspecified dementia, mild, without behavioral disturbance, psychotic disturbance, mood disturbance, and anxiety: Secondary | ICD-10-CM | POA: Diagnosis present

## 2021-09-26 DIAGNOSIS — R1900 Intra-abdominal and pelvic swelling, mass and lump, unspecified site: Secondary | ICD-10-CM | POA: Diagnosis not present

## 2021-09-26 DIAGNOSIS — E871 Hypo-osmolality and hyponatremia: Secondary | ICD-10-CM | POA: Diagnosis present

## 2021-09-26 DIAGNOSIS — R569 Unspecified convulsions: Secondary | ICD-10-CM | POA: Diagnosis present

## 2021-09-26 DIAGNOSIS — D696 Thrombocytopenia, unspecified: Secondary | ICD-10-CM | POA: Diagnosis present

## 2021-09-26 DIAGNOSIS — I700 Atherosclerosis of aorta: Secondary | ICD-10-CM | POA: Diagnosis present

## 2021-09-26 DIAGNOSIS — J910 Malignant pleural effusion: Secondary | ICD-10-CM | POA: Diagnosis present

## 2021-09-26 DIAGNOSIS — Z800 Family history of malignant neoplasm of digestive organs: Secondary | ICD-10-CM | POA: Diagnosis not present

## 2021-09-26 DIAGNOSIS — J90 Pleural effusion, not elsewhere classified: Secondary | ICD-10-CM | POA: Diagnosis not present

## 2021-09-26 DIAGNOSIS — C801 Malignant (primary) neoplasm, unspecified: Secondary | ICD-10-CM | POA: Diagnosis present

## 2021-09-26 DIAGNOSIS — D72819 Decreased white blood cell count, unspecified: Secondary | ICD-10-CM | POA: Diagnosis present

## 2021-09-26 DIAGNOSIS — E785 Hyperlipidemia, unspecified: Secondary | ICD-10-CM | POA: Diagnosis present

## 2021-09-26 DIAGNOSIS — Z833 Family history of diabetes mellitus: Secondary | ICD-10-CM | POA: Diagnosis not present

## 2021-09-26 DIAGNOSIS — N281 Cyst of kidney, acquired: Secondary | ICD-10-CM | POA: Diagnosis present

## 2021-09-26 DIAGNOSIS — Z825 Family history of asthma and other chronic lower respiratory diseases: Secondary | ICD-10-CM | POA: Diagnosis not present

## 2021-09-26 DIAGNOSIS — Z66 Do not resuscitate: Secondary | ICD-10-CM | POA: Diagnosis present

## 2021-09-26 DIAGNOSIS — R109 Unspecified abdominal pain: Secondary | ICD-10-CM | POA: Diagnosis present

## 2021-09-26 DIAGNOSIS — N820 Vesicovaginal fistula: Secondary | ICD-10-CM | POA: Diagnosis present

## 2021-09-26 DIAGNOSIS — Z8261 Family history of arthritis: Secondary | ICD-10-CM | POA: Diagnosis not present

## 2021-09-26 DIAGNOSIS — Z20822 Contact with and (suspected) exposure to covid-19: Secondary | ICD-10-CM | POA: Diagnosis present

## 2021-09-26 LAB — COMPREHENSIVE METABOLIC PANEL
ALT: 14 U/L (ref 0–44)
AST: 21 U/L (ref 15–41)
Albumin: 3.4 g/dL — ABNORMAL LOW (ref 3.5–5.0)
Alkaline Phosphatase: 56 U/L (ref 38–126)
Anion gap: 9 (ref 5–15)
BUN: 8 mg/dL (ref 8–23)
CO2: 25 mmol/L (ref 22–32)
Calcium: 9 mg/dL (ref 8.9–10.3)
Chloride: 101 mmol/L (ref 98–111)
Creatinine, Ser: 0.94 mg/dL (ref 0.44–1.00)
GFR, Estimated: >60 mL/min (ref >60)
Glucose, Bld: 91 mg/dL (ref 70–99)
Potassium: 3.6 mmol/L (ref 3.5–5.1)
Sodium: 135 mmol/L (ref 135–145)
Total Bilirubin: 1.1 mg/dL (ref 0.3–1.2)
Total Protein: 5.9 g/dL — ABNORMAL LOW (ref 6.5–8.1)

## 2021-09-26 LAB — CBC
HCT: 43.4 % (ref 36.0–46.0)
Hemoglobin: 14.6 g/dL (ref 12.0–15.0)
MCH: 29.9 pg (ref 26.0–34.0)
MCHC: 33.6 g/dL (ref 30.0–36.0)
MCV: 88.8 fL (ref 80.0–100.0)
Platelets: 159 10*3/uL (ref 150–400)
RBC: 4.89 MIL/uL (ref 3.87–5.11)
RDW: 14.5 % (ref 11.5–15.5)
WBC: 3.7 10*3/uL — ABNORMAL LOW (ref 4.0–10.5)
nRBC: 0 % (ref 0.0–0.2)

## 2021-09-26 LAB — PROTIME-INR
INR: 1 (ref 0.8–1.2)
Prothrombin Time: 13.3 seconds (ref 11.4–15.2)

## 2021-09-26 LAB — ACID FAST SMEAR (AFB, MYCOBACTERIA): Acid Fast Smear: NEGATIVE

## 2021-09-26 LAB — GLUCOSE, BODY FLUID OTHER: Glucose, Body Fluid Other: 90 mg/dL

## 2021-09-26 MED ORDER — LIDOCAINE HCL 1 % IJ SOLN
INTRAMUSCULAR | Status: AC
  Filled 2021-09-26: qty 10

## 2021-09-26 MED ORDER — MIDAZOLAM HCL 2 MG/2ML IJ SOLN
INTRAMUSCULAR | Status: AC | PRN
  Administered 2021-09-26: .5 mg via INTRAVENOUS
  Administered 2021-09-26: 1 mg via INTRAVENOUS
  Administered 2021-09-26: .5 mg via INTRAVENOUS

## 2021-09-26 MED ORDER — ROSUVASTATIN CALCIUM 20 MG PO TABS
20.0000 mg | ORAL_TABLET | Freq: Every day | ORAL | Status: DC
  Administered 2021-09-27: 20 mg via ORAL
  Filled 2021-09-26: qty 1

## 2021-09-26 MED ORDER — FENTANYL CITRATE (PF) 100 MCG/2ML IJ SOLN
INTRAMUSCULAR | Status: AC
  Filled 2021-09-26: qty 4

## 2021-09-26 MED ORDER — FENTANYL CITRATE (PF) 100 MCG/2ML IJ SOLN
INTRAMUSCULAR | Status: AC | PRN
  Administered 2021-09-26 (×2): 25 ug via INTRAVENOUS
  Administered 2021-09-26: 50 ug via INTRAVENOUS

## 2021-09-26 MED ORDER — MIDAZOLAM HCL 2 MG/2ML IJ SOLN
INTRAMUSCULAR | Status: AC
  Filled 2021-09-26: qty 4

## 2021-09-26 MED ORDER — ENOXAPARIN SODIUM 40 MG/0.4ML IJ SOSY
40.0000 mg | PREFILLED_SYRINGE | INTRAMUSCULAR | Status: DC
  Administered 2021-09-27: 40 mg via SUBCUTANEOUS
  Filled 2021-09-26: qty 0.4

## 2021-09-26 NOTE — Progress Notes (Signed)
°  Transition of Care [facility]) Screening Note   Patient Details  Name: Molly Patel Date of Birth: 05-06-1946   Transition of Care [facility]) CM/SW Contact:    Cyndi Bender, RN Phone Number: 09/26/2021, 8:49 AM    Transition of Care Department [facility]) has reviewed patient and no TOC needs have been identified at this time. We will continue to monitor patient advancement through interdisciplinary progression rounds. If new patient transition needs arise, please place a TOC consult.

## 2021-09-26 NOTE — Progress Notes (Signed)
° °  Subjective: I seen and evaluated Molly Patel at bedside. Her son was present at bedside. She states she is feeling okay and trying to process everything regarding her health. She is receptive to the current plan and denies any symptoms at this time.  Objective:  Vital signs in last 24 hours: Vitals:   09/26/21 0245 09/26/21 0415 09/26/21 0430 09/26/21 0512  BP: 138/70 140/76 125/70 (!) 136/91  Pulse: 82 84 81 82  Resp: 17 17 17 11   Temp:    98 F (36.7 C)  TempSrc:    Oral  SpO2: 93% 93% 94% 92%  Weight:    77.9 kg  Height:    5\' 7"  (1.702 m)   Physical Exam Constitutional:      General: She is awake. She is not in acute distress. HENT:     Head: Normocephalic and atraumatic.  Cardiovascular:     Rate and Rhythm: Normal rate and regular rhythm.     Heart sounds: Normal heart sounds.  Pulmonary:     Effort: Pulmonary effort is normal.     Breath sounds: Normal breath sounds and air entry.  Abdominal:     Tenderness: There is no abdominal tenderness.  Musculoskeletal:     Right lower leg: No edema.     Left lower leg: No edema.  Skin:    General: Skin is warm and dry.  Neurological:     General: No focal deficit present.     Mental Status: She is alert.  Psychiatric:        Behavior: Behavior normal. Behavior is cooperative.     Assessment/Plan:  Principal Problem:   Flank pain   Suspected metastatic disease with unknown primary Pt found to have numerous masses found in the right peritoneum and small bowel mesentery which extends throughout chest cavity.  Large right pleural effusion also noted.  Status post thoracocentesis, 1.4 L removed.  Pleural fluid meets light criteria, exudative.  Patient exhibiting symptoms from mass-effect and has an extensive smoking history, though not currently smoking. High suspicion for metastatic disease though source of metastatic disease unclear.  Patient will benefit from CT biopsy. --IR consulted; planned for biopsy  today --NPO --Hold lovenox until after procedure  Possible Urinary tract infection Pt presented with urinary symptoms concerning for UTI. Several weeks ago pt was treated for UTI however symptoms persisted. UA significant for many bacteria, few leukocytes and negative nitrates. Renal study significant for mass effect present on right retroperitoneum amongst other areas of the body cavity. Pt is leukopenic, afebrile and denies chills. Low suspicion for pyelonephritis. Pt today states her symptoms has since resolved. Treatment not indicated.    Prior to Admission Living Arrangement: Anticipated Discharge Location: Barriers to Discharge: Dispo: Anticipated discharge in approximately 1-2 day(s).   Timothy Lasso, MD 09/26/2021, 7:31 AM Pager: [phone] After 5pm on weekdays and 1pm on weekends: On Call pager [phone]

## 2021-09-26 NOTE — Discharge Summary (Addendum)
Name: Molly Patel MRN: [id number] DOB: 20-Nov-1945 76 y.o. PCP: Pcp, No  Date of Admission: 09/24/2021  3:25 PM Date of Discharge: 09/27/21 Attending Physician: Lottie Mussel, MD  Discharge Diagnosis: 1. Metastatic disease unknown source  Discharge Medications: Allergies as of 09/27/2021   No Known Allergies      Medication List     STOP taking these medications    tamsulosin 0.4 MG Caps capsule Commonly known as: FLOMAX       TAKE these medications    aspirin EC 325 MG tablet Take 1 tablet (325 mg total) by mouth daily.   atenolol 50 MG tablet Commonly known as: TENORMIN TAKE 1 TABLET EVERY DAY   cholecalciferol 25 MCG (1000 UNIT) tablet Commonly known as: VITAMIN D TAKE 1 TABLET EVERY DAY What changed: how to take this   donepezil 5 MG tablet Commonly known as: ARICEPT TAKE 1 TABLET AT BEDTIME   hydrochlorothiazide 12.5 MG capsule Commonly known as: MICROZIDE TAKE 1 CAPSULE EVERY DAY   levETIRAcetam 500 MG tablet Commonly known as: KEPPRA Take 1 tablet (500 mg total) by mouth 2 (two) times daily. Needs to establish with new PCP.  Dr. Buelah Manis is no longer at this office.  Must be seen before more refills.   memantine 5 MG tablet Commonly known as: NAMENDA Take 5 mg by mouth daily.   rosuvastatin 20 MG tablet Commonly known as: CRESTOR Take 20 mg by mouth once a week. Thursday   traMADol 50 MG tablet Commonly known as: ULTRAM Take 1 tablet (50 mg total) by mouth every 6 (six) hours as needed for up to 3 days for severe pain or moderate pain. What changed:  when to take this reasons to take this        Disposition and follow-up:   Ms.[name] was discharged from [facility] in Stable condition.  At the hospital follow up visit please address:  1.  Follow up with oncology and PCP  2.  Labs / imaging needed at time of follow-up: CT biopsy  3.  Pending labs/ test needing follow-up: Pleural cytology, CT  biopsy   Follow-up Appointments:  Follow-up Information     Molly Nation, MD. Go on 09/30/2021.   Specialty: Internal Medicine Why: Monday at [facility] information: [place] [address] [facility] Course by problem list: 1.  Patient presented complaining of progressive flank pain, difficulty urinating, SOB, fatigue and malaise.  CT of the chest/abdomen revealed a large right pleural effusion, nodal masses in the right retroperitoneum and small bowel mesentery and several pleural thickenings.  Thoracocentesis was done to 09/25/21 with 1.4 L removed.  Pleural fluid found to be exudative, positive lights criteria.  Patient underwent CT-guided biopsy 09/26/2021.  Otherwise patient is clinically stable.  Patient will follow up with oncology and PCP pending biopsy results. Pt was seen by PT and HHPT was recommended. Pt declines [facility] services at this time.  Discharge Exam:   BP 112/60 (BP Location: Left Arm)    Pulse 80    Temp (!) 97.5 F (36.4 C) (Oral)    Resp 19    Ht 5\' 7"  (1.702 m)    Wt 77.9 kg    SpO2 95%    BMI 26.90 kg/m  Discharge exam: Physical Exam Cardiovascular:     Rate and Rhythm: Normal rate and regular rhythm.  Heart sounds: Normal heart sounds.  Pulmonary:     Effort: Pulmonary effort is normal.     Breath sounds: Normal breath sounds and air entry.  Abdominal:     Tenderness: There is no abdominal tenderness.  Musculoskeletal:     Right lower leg: No edema.     Left lower leg: No edema.  Skin:    General: Skin is warm and dry.  Neurological:     General: No focal deficit present.     Mental Status: She is alert.  Psychiatric:        Behavior: Behavior normal. Behavior is cooperative.     Pertinent Labs, Studies, and Procedures:  CBC Latest Ref Rng & Units 09/26/2021 09/24/2021 08/20/2021  WBC 4.0 - 10.5 K/uL 3.7(L) 3.1(L) 3.1(L)  Hemoglobin 12.0 - 15.0 g/dL 14.6 15.4(H) 14.1  Hematocrit 36.0 - 46.0 % 43.4  47.5(H) 44.4  Platelets 150 - 400 K/uL 159 148(L) 144(L)   BMP Latest Ref Rng & Units 09/26/2021 09/24/2021 08/20/2021  Glucose 70 - 99 mg/dL 91 103(H) 107(H)  BUN 8 - 23 mg/dL 8 12 12   Creatinine 0.44 - 1.00 mg/dL 0.94 0.98 0.90  BUN/Creat Ratio 6 - 22 (calc) - - -  Sodium 135 - 145 mmol/L 135 134(L) 137  Potassium 3.5 - 5.1 mmol/L 3.6 4.1 4.5  Chloride 98 - 111 mmol/L 101 99 101  CO2 22 - 32 mmol/L 25 24 29   Calcium 8.9 - 10.3 mg/dL 9.0 9.2 9.3   DG Chest 2 View  Result Date: 09/24/2021 CLINICAL DATA:  A 76 year old female presents with chest pain and shortness of breath. EXAM: CHEST - 2 VIEW COMPARISON:  June 29, 2012. Also with CT of the abdomen and pelvis of September 24, 2021. FINDINGS: Trachea is midline. Cardiomediastinal contours and hilar structures with obscured RIGHT hilum. Relatively normal appearing LEFT hilum but with widening of the paraspinal stripe in the LEFT chest likely due to paraspinal soft tissue seen on the previous CT of the abdomen and pelvis. Dense opacification of the lower 2/3 of the RIGHT hemithorax. LEFT chest with relatively normal appearance, no opacity or effusion other than some thickening of the paraspinal stripe on the LEFT. No pneumothorax. On limited assessment there is no acute skeletal process. IMPRESSION: 1. Dense opacification of 2/3 of the RIGHT hemithorax, inferior RIGHT chest in this patient with known malignant appearing RIGHT-sided effusion and basilar airspace disease. 2. Mild thickening of the paraspinal stripe on the LEFT likely due to soft tissue along the inferior thoracic spine. CT imaging of the chest may be helpful for further evaluation. Electronically Signed   By: Zetta Bills M.D.   On: 09/24/2021 18:16   CT Angio Chest PE W and/or Wo Contrast  Result Date: 09/25/2021 CLINICAL DATA:  Chest pain, shortness of breath EXAM: CT ANGIOGRAPHY CHEST WITH CONTRAST TECHNIQUE: Multidetector CT imaging of the chest was performed using the standard  protocol during bolus administration of intravenous contrast. Multiplanar CT image reconstructions and MIPs were obtained to evaluate the vascular anatomy. RADIATION DOSE REDUCTION: This exam was performed according to the departmental dose-optimization program which includes automated exposure control, adjustment of the mA and/or kV according to patient size and/or use of iterative reconstruction technique. CONTRAST:  31mL OMNIPAQUE IOHEXOL 350 MG/ML SOLN COMPARISON:  CT abdomen 09/24/2021 FINDINGS: Cardiovascular: Satisfactory opacification of the pulmonary arteries to the segmental level. No evidence of pulmonary embolism. Normal heart size. No pericardial effusion. Mediastinum/Nodes: Mediastinum is shifted towards the left. No enlarged mediastinal,  hilar, or axillary lymph nodes. Thyroid gland, trachea, and esophagus demonstrate no significant findings. Large right retroperitoneal soft tissue mass measuring 4.4 x 5.8 x 7 cm which extends superiorly along the right para-aortic region through the diaphragmatic hiatus into the thorax and pleural space. Lungs/Pleura: Large right pleural effusion with partial collapse of the right lung with only a small area of aerated right upper lobe remaining. Severe soft tissue pleural thickening along the posterior aspect of the right pleural space measuring 2.8 cm in maximal thickness extending into the posterior mediastinum. 4.2 x 3.1 cm right pleural based mass along the anteromedial aspect abutting the pericardium. Upper Abdomen: No acute abnormality. Musculoskeletal: No acute osseous abnormality. No aggressive osseous lesion. Right posterior pleural mass abuts the mid and lower thoracic spine. Review of the MIP images confirms the above findings. IMPRESSION: 1. No acute pulmonary embolus. 2. Large right pleural effusion with partial collapse of the right lung with only a small area of aerated right upper lobe remaining. 3. Partially visualized, large right retroperitoneal  soft tissue mass measuring 4.4 x 5.8 x 7 cm which extends superiorly along the right para-aortic region through the diaphragmatic hiatus into the thorax and pleural space. Severe soft tissue pleural thickening along the posterior aspect of the right pleural space measuring 2.8 cm in maximum thickness. 4.2 x 3.1 cm right pleural based mass along the anteromedial aspect abutting the pericardium. Findings are concerning for malignancy. Electronically Signed   By: Kathreen Devoid M.D.   On: 09/25/2021 06:14   CT BIOPSY  Result Date: 09/26/2021 CLINICAL DATA:  Posterior mediastinal and retroperitoneal mass and adenopathy EXAM: CT GUIDED CORE BIOPSY OF RETROPERITONEAL MASS ANESTHESIA/SEDATION: Intravenous Fentanyl 190mcg and Versed 2mg  were administered as conscious sedation during continuous monitoring of the patient's level of consciousness and physiological / cardiorespiratory status by the radiology RN, with a total moderate sedation time of 15 minutes. PROCEDURE: The procedure risks, benefits, and alternatives were explained to the patient. Questions regarding the procedure were encouraged and answered. The patient understands and consents to the procedure. Patient placed prone. Select axial scans through the upper abdomen were obtained. The right posterior pleural/retroperitoneal mass was localized and an appropriate skin entry site was determined and marked. The operative field was prepped with chlorhexidinein a sterile fashion, and a sterile drape was applied covering the operative field. A sterile gown and sterile gloves were used for the procedure. Local anesthesia was provided with 1% Lidocaine. Under CT fluoroscopic guidance, a 17 gauge trocar needle was advanced to the margin of the lesion. Once needle tip position was confirmed, coaxial 18-gauge core biopsy samples were obtained, submitted in saline to surgical pathology. The guide needle was removed. Postprocedure images show no hemorrhage or other apparent  complication. The patient tolerated the procedure well. RADIATION DOSE REDUCTION: This exam was performed according to the departmental dose-optimization program which includes automated exposure control, adjustment of the mA and/or kV according to patient size and/or use of iterative reconstruction technique. COMPLICATIONS: None immediate FINDINGS: Right posterior pleural and contiguous retroperitoneal mass was localized. Representative core biopsy samples obtained under CT guidance. IMPRESSION: 1. Technically successful CT-guided core biopsy, right retroperitoneal mass. Electronically Signed   By: Lucrezia Europe M.D.   On: 09/26/2021 15:54   DG Chest Port 1 View  Result Date: 09/25/2021 CLINICAL DATA:  Status post thoracentesis EXAM: PORTABLE CHEST 1 VIEW COMPARISON:  09/24/2021 FINDINGS: Significant decrease in the right pleural effusion with a small amount of pleural fluid remaining. Right basilar airspace  disease. Persistent right pleural thickening along the paramediastinal aspect. Left lung is clear. No pneumothorax. Stable cardiomediastinal silhouette. No acute osseous abnormality. IMPRESSION: 1. Significant decrease in the right pleural effusion with a small amount of pleural fluid remaining. No pneumothorax. 2. Persistent right basilar airspace disease. Electronically Signed   By: Kathreen Devoid M.D.   On: 09/25/2021 12:52   CT Renal Stone Study  Result Date: 09/24/2021 CLINICAL DATA:  Right flank pain. EXAM: CT ABDOMEN AND PELVIS WITHOUT CONTRAST TECHNIQUE: Multidetector CT imaging of the abdomen and pelvis was performed following the standard protocol without IV contrast. RADIATION DOSE REDUCTION: This exam was performed according to the departmental dose-optimization program which includes automated exposure control, adjustment of the mA and/or kV according to patient size and/or use of iterative reconstruction technique. COMPARISON:  None. FINDINGS: Lower chest: There is a moderate right pleural  effusion. A soft tissue rind is identified overlying which overlies the posterior and medial right lung with overlying moderate right pleural effusion. Tumor rind extends into the posterior mediastinum, image 16/3. Hepatobiliary: No focal liver abnormality identified. Gallbladder appears normal. No bile duct dilatation. Pancreas: Pancreas is unremarkable. Spleen: Spleen measures 9.8 cm in cranial caudal dimension. No focal splenic lesion. Adrenals/Urinary Tract: Normal appearance of the adrenal glands. No signs of nephrolithiasis or hydronephrosis. Urinary bladder appears normal for degree of distension. Stomach/Bowel: Stomach appears within normal limits. The appendix is not confidently identified. No bowel wall thickening, inflammation, or distension. Vascular/Lymphatic: Aortic atherosclerosis without aneurysm. Large right retroperitoneal nodal mass measures 4.4 by 5.7 by 7.1 cm, image 31/3 and image 71/3. Within the central small bowel mesentery there is a conglomeration of enlarged lymph nodes/mass which measures 2.1 x 6.3 by 7.1 cm, image 48/3 and image 36/6. Right pelvic sidewall lymph node is borderline enlarged measuring 1 cm, image 70/3. Reproductive: The uterus appears surgically absent. Indeterminate ovoid mass within the left adnexal region measures 3.7 x 2.1 cm, image 69/3. Other: No significant free fluid identified. Musculoskeletal: Degenerative disc disease noted at L5-S1. No aggressive lytic or sclerotic bone lesions. IMPRESSION: 1. Large nodal masses identified within right retroperitoneum and small bowel mesentery. Findings are concerning for either lymphoproliferative disorder or metastatic disease. Additionally, there is a rind of increased soft tissue overlying the posteromedial right lower lobe with extension into the posterior mediastinum. This is also worrisome for malignancy. Further evaluation with PET-CT and tissue sampling is recommended. 2. A moderate right pleural effusion overlies the  soft tissue rind within the right hemithorax. Consider further evaluation with diagnostic thoracentesis. 3. Indeterminate ovoid mass within the left adnexal region measures 3.7 x 2.1 cm. Attention in this area on PET-CT is advised. 4. Borderline splenomegaly. 5. Aortic Atherosclerosis (ICD10-I70.0). Electronically Signed   By: Kerby Moors M.D.   On: 09/24/2021 16:58     Discharge Instructions: Discharge Instructions     Ambulatory referral to Hematology / Oncology   Complete by: As directed    Call MD for:  difficulty breathing, headache or visual disturbances   Complete by: As directed    Call MD for:  extreme fatigue   Complete by: As directed    Call MD for:  hives   Complete by: As directed    Call MD for:  persistant dizziness or light-headedness   Complete by: As directed    Call MD for:  persistant nausea and vomiting   Complete by: As directed    Call MD for:  redness, tenderness, or signs of infection (pain, swelling, redness,  odor or green/yellow discharge around incision site)   Complete by: As directed    Call MD for:  severe uncontrolled pain   Complete by: As directed    Call MD for:  temperature >100.4   Complete by: As directed    Diet - low sodium heart healthy   Complete by: As directed    Increase activity slowly   Complete by: As directed        Signed: Timothy Lasso, MD 09/27/2021, 9:36 AM   Pager: [phone]

## 2021-09-26 NOTE — Care Management Obs Status (Signed)
[place] NOTIFICATION   Patient Details  Name: NYLIAH NIERENBERG MRN: [id number] Date of Birth: 25-Mar-1946   Medicare Observation Status Notification Given:  Yes    Cyndi Bender, RN 09/26/2021, 1:44 PM

## 2021-09-26 NOTE — Consult Note (Signed)
Chief Complaint: Patient was seen in consultation today for CT guided right pleural based mass/thickening biopsy  Chief Complaint  Patient presents with   Chest Pain     Referring Physician(s): [place]  Supervising Physician: Arne Cleveland  Patient Status: [facility] - In-pt  History of Present Illness: Molly Patel is a 76 y.o. female , ex smoker, with PMH sig for depression, mild dementia, HLD, HTN, seizures, stroke, vit D def, incontinence, ovarian cyst, benign brain tumor resection. She has been feeling well but began to develop right flank pain, evolving into right chest wall pain, fatigue/malaise and associated dyspnea in early December 2022.  Initial evaluation included plans for chest and abdomen imaging but this had not yet been done.  The symptoms progressed and she came to the [facility] emergency department on 1/31. CT A/P revealed:  1. Large nodal masses identified within right retroperitoneum and small bowel mesentery. Findings are concerning for either lymphoproliferative disorder or metastatic disease. Additionally, there is a rind of increased soft tissue overlying the posteromedial right lower lobe with extension into the posterior mediastinum. This is also worrisome for malignancy. Further evaluation with PET-CT and tissue sampling is recommended. 2. A moderate right pleural effusion overlies the soft tissue rind within the right hemithorax. Consider further evaluation with diagnostic thoracentesis. 3. Indeterminate ovoid mass within the left adnexal region measures 3.7 x 2.1 cm. Attention in this area on PET-CT is advised. 4. Borderline splenomegaly. 5. Aortic Atherosclerosis   CT angio chest revealed:   1. No acute pulmonary embolus. 2. Large right pleural effusion with partial collapse of the right lung with only a small area of aerated right upper lobe remaining. 3. Partially visualized, large right retroperitoneal soft tissue mass measuring 4.4 x 5.8 x  7 cm which extends superiorly along the right para-aortic region through the diaphragmatic hiatus into the thorax and pleural space. Severe soft tissue pleural thickening along the posterior aspect of the right pleural space measuring 2.8 cm in maximum thickness. 4.2 x 3.1 cm right pleural based mass along the anteromedial aspect abutting the pericardium. Findings are concerning for malignancy  She underwent rt thoracentesis by CCM on 2/1 yielding 1.4 liters fluid; cytology pending; per primary team's d/w oncology they would like bx of most accessible site performed even before pleural cytology is finalized.    Past Medical History:  Diagnosis Date   Angioedema    Depression    Dizziness    Echocardiogram with ECG monitoring july 2012   mild LVH, normal event monitor    Hyperlipidemia    Hypertension    takes meds daily   Incontinence    Insomnia    Ovarian cyst    Seizures ([place])    2 weeks ago   Stroke [facility]) 2016   Syncope    Vitamin D deficiency     Past Surgical History:  Procedure Laterality Date   ABDOMINAL HYSTERECTOMY     COLONOSCOPY N/A 11/02/2014   Procedure: COLONOSCOPY;  Surgeon: Rogene Houston, MD;  Location: [facility];  Service: Endoscopy;  Laterality: N/A;  140 - moved to 12:55 - Ann to notify pt   CRANIOTOMY  06/29/2012   Procedure: CRANIOTOMY TUMOR EXCISION;  Surgeon: Faythe Ghee, MD;  Location: [place] NEURO ORS;  Service: Neurosurgery;  Laterality: Left;  Left frontal Craniotomy for Meningioma   ESOPHAGEAL DILATION N/A 11/02/2014   Procedure: ESOPHAGEAL DILATION;  Surgeon: Rogene Houston, MD;  Location: [facility];  Service: Endoscopy;  Laterality: N/A;  ESOPHAGOGASTRODUODENOSCOPY N/A 11/02/2014   Procedure: ESOPHAGOGASTRODUODENOSCOPY (EGD);  Surgeon: Rogene Houston, MD;  Location: [facility];  Service: Endoscopy;  Laterality: N/A;   VESICOVAGINAL FISTULA CLOSURE W/ TAH      Allergies: Patient has no known allergies.  Medications: Prior to  Admission medications   Medication Sig Start Date End Date Taking? Authorizing Provider  aspirin EC 325 MG tablet Take 1 tablet (325 mg total) by mouth daily. 05/28/15  Yes [place], Modena Nunnery, MD  cholecalciferol (VITAMIN D) 25 MCG (1000 UNIT) tablet TAKE 1 TABLET EVERY DAY Patient taking differently: 1,000 Units daily. 10/24/19  Yes [place], Modena Nunnery, MD  donepezil (ARICEPT) 5 MG tablet TAKE 1 TABLET AT BEDTIME Patient taking differently: Take 5 mg by mouth at bedtime. 08/09/20  Yes [place], Modena Nunnery, MD  hydrochlorothiazide (MICROZIDE) 12.5 MG capsule TAKE 1 CAPSULE EVERY DAY Patient taking differently: Take 12.5 mg by mouth daily. 01/18/21  Yes Susy Frizzle, MD  levETIRAcetam (KEPPRA) 500 MG tablet Take 1 tablet (500 mg total) by mouth 2 (two) times daily. Needs to establish with new PCP.  Dr. Buelah Manis is no longer at this office.  Must be seen before more refills. 08/05/21  Yes Susy Frizzle, MD  memantine (NAMENDA) 5 MG tablet Take 5 mg by mouth daily. 08/09/21  Yes [provider]  rosuvastatin (CRESTOR) 20 MG tablet Take 20 mg by mouth once a week. Thursday 07/22/21  Yes [provider]  tamsulosin (FLOMAX) 0.4 MG CAPS capsule Take 0.4 mg by mouth daily. 08/15/21  Yes [provider]  traMADol (ULTRAM) 50 MG tablet Take 50 mg by mouth 3 (three) times daily as needed for pain. 09/19/21  Yes [provider]  atenolol (TENORMIN) 50 MG tablet TAKE 1 TABLET EVERY DAY Patient taking differently: Take 50 mg by mouth daily. 08/30/20   Alycia Rossetti, MD     Family History  Problem Relation Age of Onset   Stroke Father    Asthma Grandchild    Arthritis Sister    Diabetes Sister    Colon cancer Brother     Social History   Socioeconomic History   Marital status: Divorced    Spouse name: Not on file   Number of children: 2   Years of education: Not on file   Highest education level: Not on file  Occupational History   Occupation: Retired     Comment: Tree surgeon  Tobacco Use   Smoking status: Former    Years: 7.00    Types: Cigarettes    Quit date: 08/25/1985    Years since quitting: 36.1   Smokeless tobacco: Never  Substance and Sexual Activity   Alcohol use: No   Drug use: No   Sexual activity: Not on file  Other Topics Concern   Not on file  Social History Narrative   Not on file   Social Determinants of Health   Financial Resource Strain: Not on file  Food Insecurity: Not on file  Transportation Needs: Not on file  Physical Activity: Not on file  Stress: Not on file  Social Connections: Not on file      Review of Systems denies fever,HA,CP,dyspnea, cough, abd/back pain,N/V or bleeding  Vital Signs: BP 128/76 (BP Location: Left Arm)    Pulse 84    Temp 98.2 F (36.8 C) (Oral)    Resp 20    Ht 5\' 7"  (1.702 m)    Wt 171 lb 11.8 oz (77.9 kg)  SpO2 93%    BMI 26.90 kg/m   Physical Exam awake, answers questions ok; son in room; chest- sl dim BS rt base, left clear; heart- RRR; abd- soft,+BS,NT; no LE edema  Imaging: DG Chest 2 View  Result Date: 09/24/2021 CLINICAL DATA:  A 76 year old female presents with chest pain and shortness of breath. EXAM: CHEST - 2 VIEW COMPARISON:  June 29, 2012. Also with CT of the abdomen and pelvis of September 24, 2021. FINDINGS: Trachea is midline. Cardiomediastinal contours and hilar structures with obscured RIGHT hilum. Relatively normal appearing LEFT hilum but with widening of the paraspinal stripe in the LEFT chest likely due to paraspinal soft tissue seen on the previous CT of the abdomen and pelvis. Dense opacification of the lower 2/3 of the RIGHT hemithorax. LEFT chest with relatively normal appearance, no opacity or effusion other than some thickening of the paraspinal stripe on the LEFT. No pneumothorax. On limited assessment there is no acute skeletal process. IMPRESSION: 1. Dense opacification of 2/3 of the RIGHT hemithorax, inferior RIGHT chest in this patient  with known malignant appearing RIGHT-sided effusion and basilar airspace disease. 2. Mild thickening of the paraspinal stripe on the LEFT likely due to soft tissue along the inferior thoracic spine. CT imaging of the chest may be helpful for further evaluation. Electronically Signed   By: Zetta Bills M.D.   On: 09/24/2021 18:16   CT Angio Chest PE W and/or Wo Contrast  Result Date: 09/25/2021 CLINICAL DATA:  Chest pain, shortness of breath EXAM: CT ANGIOGRAPHY CHEST WITH CONTRAST TECHNIQUE: Multidetector CT imaging of the chest was performed using the standard protocol during bolus administration of intravenous contrast. Multiplanar CT image reconstructions and MIPs were obtained to evaluate the vascular anatomy. RADIATION DOSE REDUCTION: This exam was performed according to the departmental dose-optimization program which includes automated exposure control, adjustment of the mA and/or kV according to patient size and/or use of iterative reconstruction technique. CONTRAST:  30mL OMNIPAQUE IOHEXOL 350 MG/ML SOLN COMPARISON:  CT abdomen 09/24/2021 FINDINGS: Cardiovascular: Satisfactory opacification of the pulmonary arteries to the segmental level. No evidence of pulmonary embolism. Normal heart size. No pericardial effusion. Mediastinum/Nodes: Mediastinum is shifted towards the left. No enlarged mediastinal, hilar, or axillary lymph nodes. Thyroid gland, trachea, and esophagus demonstrate no significant findings. Large right retroperitoneal soft tissue mass measuring 4.4 x 5.8 x 7 cm which extends superiorly along the right para-aortic region through the diaphragmatic hiatus into the thorax and pleural space. Lungs/Pleura: Large right pleural effusion with partial collapse of the right lung with only a small area of aerated right upper lobe remaining. Severe soft tissue pleural thickening along the posterior aspect of the right pleural space measuring 2.8 cm in maximal thickness extending into the posterior  mediastinum. 4.2 x 3.1 cm right pleural based mass along the anteromedial aspect abutting the pericardium. Upper Abdomen: No acute abnormality. Musculoskeletal: No acute osseous abnormality. No aggressive osseous lesion. Right posterior pleural mass abuts the mid and lower thoracic spine. Review of the MIP images confirms the above findings. IMPRESSION: 1. No acute pulmonary embolus. 2. Large right pleural effusion with partial collapse of the right lung with only a small area of aerated right upper lobe remaining. 3. Partially visualized, large right retroperitoneal soft tissue mass measuring 4.4 x 5.8 x 7 cm which extends superiorly along the right para-aortic region through the diaphragmatic hiatus into the thorax and pleural space. Severe soft tissue pleural thickening along the posterior aspect of the right pleural space measuring  2.8 cm in maximum thickness. 4.2 x 3.1 cm right pleural based mass along the anteromedial aspect abutting the pericardium. Findings are concerning for malignancy. Electronically Signed   By: Kathreen Devoid M.D.   On: 09/25/2021 06:14   DG Chest Port 1 View  Result Date: 09/25/2021 CLINICAL DATA:  Status post thoracentesis EXAM: PORTABLE CHEST 1 VIEW COMPARISON:  09/24/2021 FINDINGS: Significant decrease in the right pleural effusion with a small amount of pleural fluid remaining. Right basilar airspace disease. Persistent right pleural thickening along the paramediastinal aspect. Left lung is clear. No pneumothorax. Stable cardiomediastinal silhouette. No acute osseous abnormality. IMPRESSION: 1. Significant decrease in the right pleural effusion with a small amount of pleural fluid remaining. No pneumothorax. 2. Persistent right basilar airspace disease. Electronically Signed   By: Kathreen Devoid M.D.   On: 09/25/2021 12:52   CT Renal Stone Study  Result Date: 09/24/2021 CLINICAL DATA:  Right flank pain. EXAM: CT ABDOMEN AND PELVIS WITHOUT CONTRAST TECHNIQUE: Multidetector CT  imaging of the abdomen and pelvis was performed following the standard protocol without IV contrast. RADIATION DOSE REDUCTION: This exam was performed according to the departmental dose-optimization program which includes automated exposure control, adjustment of the mA and/or kV according to patient size and/or use of iterative reconstruction technique. COMPARISON:  None. FINDINGS: Lower chest: There is a moderate right pleural effusion. A soft tissue rind is identified overlying which overlies the posterior and medial right lung with overlying moderate right pleural effusion. Tumor rind extends into the posterior mediastinum, image 16/3. Hepatobiliary: No focal liver abnormality identified. Gallbladder appears normal. No bile duct dilatation. Pancreas: Pancreas is unremarkable. Spleen: Spleen measures 9.8 cm in cranial caudal dimension. No focal splenic lesion. Adrenals/Urinary Tract: Normal appearance of the adrenal glands. No signs of nephrolithiasis or hydronephrosis. Urinary bladder appears normal for degree of distension. Stomach/Bowel: Stomach appears within normal limits. The appendix is not confidently identified. No bowel wall thickening, inflammation, or distension. Vascular/Lymphatic: Aortic atherosclerosis without aneurysm. Large right retroperitoneal nodal mass measures 4.4 by 5.7 by 7.1 cm, image 31/3 and image 71/3. Within the central small bowel mesentery there is a conglomeration of enlarged lymph nodes/mass which measures 2.1 x 6.3 by 7.1 cm, image 48/3 and image 36/6. Right pelvic sidewall lymph node is borderline enlarged measuring 1 cm, image 70/3. Reproductive: The uterus appears surgically absent. Indeterminate ovoid mass within the left adnexal region measures 3.7 x 2.1 cm, image 69/3. Other: No significant free fluid identified. Musculoskeletal: Degenerative disc disease noted at L5-S1. No aggressive lytic or sclerotic bone lesions. IMPRESSION: 1. Large nodal masses identified within right  retroperitoneum and small bowel mesentery. Findings are concerning for either lymphoproliferative disorder or metastatic disease. Additionally, there is a rind of increased soft tissue overlying the posteromedial right lower lobe with extension into the posterior mediastinum. This is also worrisome for malignancy. Further evaluation with PET-CT and tissue sampling is recommended. 2. A moderate right pleural effusion overlies the soft tissue rind within the right hemithorax. Consider further evaluation with diagnostic thoracentesis. 3. Indeterminate ovoid mass within the left adnexal region measures 3.7 x 2.1 cm. Attention in this area on PET-CT is advised. 4. Borderline splenomegaly. 5. Aortic Atherosclerosis (ICD10-I70.0). Electronically Signed   By: Kerby Moors M.D.   On: 09/24/2021 16:58    Labs:  CBC: Recent Labs    11/06/20 1011 08/20/21 1252 09/24/21 1731 09/26/21 0552  WBC 3.6* 3.1* 3.1* 3.7*  HGB 14.4 14.1 15.4* 14.6  HCT 42.8 44.4 47.5* 43.4  PLT  133* 144* 148* 159    COAGS: Recent Labs    09/26/21 0552  INR 1.0    BMP: Recent Labs    10/03/20 0852 08/20/21 1252 09/24/21 1731 09/26/21 0552  NA 137 137 134* 135  K 3.9 4.5 4.1 3.6  CL 98 101 99 101  CO2 25 29 24 25   GLUCOSE 92 107* 103* 91  BUN 15 12 12 8   CALCIUM 9.8 9.3 9.2 9.0  CREATININE 0.90 0.90 0.98 0.94  GFRNONAA  --  >60 59* >60    LIVER FUNCTION TESTS: Recent Labs    10/03/20 0852 09/25/21 1525 09/26/21 0552  BILITOT 0.9  --  1.1  AST 21  --  21  ALT 17  --  14  ALKPHOS  --   --  56  PROT 8.0 6.0* 5.9*  ALBUMIN  --   --  3.4*    TUMOR MARKERS: No results for input(s): AFPTM, CEA, CA199, CHROMGRNA in the last 8760 hours.  Assessment and Plan: 76 y.o. female , ex smoker, with PMH sig for depression, mild dementia, HLD, HTN, seizures, stroke, vit D def, incontinence, ovarian cyst, benign brain tumor resection. She has been feeling well but began to develop right flank pain, evolving into  right chest wall pain, fatigue/malaise and associated dyspnea in early December 2022.  Initial evaluation included plans for chest and abdomen imaging but this had not yet been done.  The symptoms progressed and she came to the [facility] emergency department on 1/31. CT A/P revealed:  1. Large nodal masses identified within right retroperitoneum and small bowel mesentery. Findings are concerning for either lymphoproliferative disorder or metastatic disease. Additionally, there is a rind of increased soft tissue overlying the posteromedial right lower lobe with extension into the posterior mediastinum. This is also worrisome for malignancy. Further evaluation with PET-CT and tissue sampling is recommended. 2. A moderate right pleural effusion overlies the soft tissue rind within the right hemithorax. Consider further evaluation with diagnostic thoracentesis. 3. Indeterminate ovoid mass within the left adnexal region measures 3.7 x 2.1 cm. Attention in this area on PET-CT is advised. 4. Borderline splenomegaly. 5. Aortic Atherosclerosis   CT angio chest revealed:   1. No acute pulmonary embolus. 2. Large right pleural effusion with partial collapse of the right lung with only a small area of aerated right upper lobe remaining. 3. Partially visualized, large right retroperitoneal soft tissue mass measuring 4.4 x 5.8 x 7 cm which extends superiorly along the right para-aortic region through the diaphragmatic hiatus into the thorax and pleural space. Severe soft tissue pleural thickening along the posterior aspect of the right pleural space measuring 2.8 cm in maximum thickness. 4.2 x 3.1 cm right pleural based mass along the anteromedial aspect abutting the pericardium. Findings are concerning for malignancy  She underwent rt thoracentesis by CCM on 2/1 yielding 1.4 liters fluid; cytology pending; per primary team's d/w oncology they would like bx of most accessible site performed even before  pleural cytology is finalized.Imaging studies have been reviewed by Dr. Vernard Gambles. Risks and benefits of procedure was discussed with the patient /son [facility]) including, but not limited to bleeding, infection, damage to adjacent structures , pneumothorax with possible chest tube placement or low yield requiring additional tests.  All of the questions were answered and there is agreement to proceed.  Consent signed and in chart. Procedure tent scheduled for today    Thank you for this interesting consult.  I greatly enjoyed meeting Molly Patel  and look forward to participating in their care.  A copy of this report was sent to the requesting provider on this date.  Electronically Signed: D. Rowe Robert, PA-C 09/26/2021, 9:40 AM   I spent a total of   25 minutes  in face to face in clinical consultation, greater than 50% of which was counseling/coordinating care for CT guided right pleural based mass biopsy

## 2021-09-26 NOTE — Progress Notes (Addendum)
PCCM Progress Note  We will continued to follow the chart peripherally until cytology resulted. Please call if needed in the interim.   Whitney D. Kenton Kingfisher, NP-C [facility] Personal contact information can be found on Amion  09/26/2021, 9:29 AM   Attending Addendum  Cytology on her pleural fluid still pending, suspect will be malignant.  She underwent a core needle biopsy of a retroperitoneal mass today 2/2.  Will follow clinically and by chest x-ray for reaccumulation.  We will also review cytology with her when available.  Please call if we can assist sooner.  Baltazar Apo, MD, PhD 09/26/2021, 3:42 PM [facility] [phone] or if no answer before 7:00PM call [phone] For any issues after 7:00PM please call eLink [phone]

## 2021-09-26 NOTE — Evaluation (Signed)
Physical Therapy Evaluation Patient Details Name: Molly Patel MRN: [id number] DOB: June 28, 1946 Today's Date: 09/26/2021  History of Present Illness  76 y/o female presented to ED on 09/24/21 for evaluation of flank pain, SOB, and chest pain which gradually worsening x 2 months. CTA chest showed large R pleural effusion with partial collapse R lung and large R retroperitoneal soft tissue mass measuring 4.4x5.8x7 cm and 4.2 x 3.1cm R pleural based mass along anteromedial aspect of pericardium concerning for malignancy. CT renal showed multiple intra-abdominal massess with high concern for metastatic disease. PMH: HTN, seizures, stroke, benign brain tumor resection, dementia  Clinical Impression  Patient admitted with above diagnosis. Patient presents with generalized weakness, impaired balance, and decreased activity tolerance. Patient functioning at supervision level for ambulation with no AD but demonstrates drifting L/R with no overt LOB. Educated and encouraged patient to mobilize with nursing staff and ambulate to/from bathroom with assistance. Patient will benefit from skilled PT services during acute stay to address listed deficits. Recommend HHPT at discharge to maximize functional independence and safety.        Recommendations for follow up therapy are one component of a multi-disciplinary discharge planning process, led by the attending physician.  Recommendations may be updated based on patient status, additional functional criteria and insurance authorization.  Follow Up Recommendations Home health PT    Assistance Recommended at Discharge Intermittent Supervision/Assistance  Patient can return home with the following  Assistance with cooking/housework;Direct supervision/assist for financial management;Direct supervision/assist for medications management;Assist for transportation;Help with stairs or ramp for entrance    Equipment Recommendations None recommended by PT   Recommendations for Other Services       Functional Status Assessment Patient has had a recent decline in their functional status and demonstrates the ability to make significant improvements in function in a reasonable and predictable amount of time.     Precautions / Restrictions Precautions Precautions: Fall Restrictions Weight Bearing Restrictions: No      Mobility  Bed Mobility Overal bed mobility: Needs Assistance Bed Mobility: Supine to Sit, Sit to Supine     Supine to sit: Min assist Sit to supine: Supervision   General bed mobility comments: minA for trunk elevation    Transfers Overall transfer level: Needs assistance Equipment used: None Transfers: Sit to/from Stand Sit to Stand: Supervision                Ambulation/Gait Ambulation/Gait assistance: Supervision Gait Distance (Feet): 200 Feet Assistive device: None Gait Pattern/deviations: Step-through pattern, Decreased stride length Gait velocity: decreased     General Gait Details: supervision for safety. Patient states at baseline but drifting L/R throughout and no LOB noted  Stairs            Wheelchair Mobility    Modified Rankin (Stroke Patients Only)       Balance Overall balance assessment: Mild deficits observed, not formally tested                                           Pertinent Vitals/Pain Pain Assessment Pain Assessment: Faces Faces Pain Scale: Hurts little more Pain Location: generalized with movement Pain Descriptors / Indicators: Grimacing Pain Intervention(s): Monitored during session    Home Living Family/patient expects to be discharged to:: Private residence Living Arrangements: Alone Available Help at Discharge: Family;Available PRN/intermittently Type of Home: House Home Access: Stairs to enter Entrance Stairs-Rails: None  Entrance Stairs-Number of Steps: 2   Home Layout: One level Home Equipment: Shower seat      Prior  Function Prior Level of Function : Independent/Modified Independent                     Hand Dominance        Extremity/Trunk Assessment   Upper Extremity Assessment Upper Extremity Assessment: Generalized weakness    Lower Extremity Assessment Lower Extremity Assessment: Generalized weakness    Cervical / Trunk Assessment Cervical / Trunk Assessment: Kyphotic  Communication   Communication: No difficulties  Cognition Arousal/Alertness: Awake/alert Behavior During Therapy: WFL for tasks assessed/performed Overall Cognitive Status: History of cognitive impairments - at baseline                                 General Comments: STM deficits noted with repetitive questions        General Comments      Exercises     Assessment/Plan    PT Assessment Patient needs continued PT services  PT Problem List Decreased activity tolerance;Decreased strength;Decreased mobility;Decreased balance       PT Treatment Interventions DME instruction;Gait training;Functional mobility training;Therapeutic activities;Balance training;Therapeutic exercise;Patient/family education    PT Goals (Current goals can be found in the Care Plan section)  Acute Rehab PT Goals Patient Stated Goal: to find out what's going on and go home PT Goal Formulation: With patient/family Time For Goal Achievement: 10/10/21 Potential to Achieve Goals: Fair    Frequency Min 2X/week     Co-evaluation               AM-PAC PT "6 Clicks" Mobility  Outcome Measure Help needed turning from your back to your side while in a flat bed without using bedrails?: A Little Help needed moving from lying on your back to sitting on the side of a flat bed without using bedrails?: A Little Help needed moving to and from a bed to a chair (including a wheelchair)?: A Little Help needed standing up from a chair using your arms (e.g., wheelchair or bedside chair)?: A Little Help needed to walk in  hospital room?: A Little Help needed climbing 3-5 steps with a railing? : A Little 6 Click Score: 18    End of Session   Activity Tolerance: Patient tolerated treatment well Patient left: in bed;with call bell/phone within reach;with family/visitor present Nurse Communication: Mobility status PT Visit Diagnosis: Unsteadiness on feet (R26.81);Muscle weakness (generalized) (M62.81)    Time: 7209-4709 PT Time Calculation (min) (ACUTE ONLY): 18 min   Charges:   PT Evaluation $PT Eval Low Complexity: 1 Low          Lynsey Ange A. Gilford Rile PT, DPT Acute Rehabilitation Services Pager [phone] Office [phone]   Linna Hoff 09/26/2021, 5:01 PM

## 2021-09-26 NOTE — Procedures (Signed)
°  Procedure: CT core biopsy R retroperitoneal mass   EBL:   minimal Complications:  none immediate  See full dictation in [employer].  Dillard Cannon MD Main # [phone] Pager  [phone] Mobile [phone]

## 2021-09-27 MED ORDER — TRAMADOL HCL 50 MG PO TABS
50.0000 mg | ORAL_TABLET | Freq: Four times a day (QID) | ORAL | 0 refills | Status: AC | PRN
Start: 2021-09-27 — End: 2021-09-30

## 2021-09-27 NOTE — TOC Progression Note (Signed)
Transition of Care [facility]) - Progression Note    Patient Details  Name: Molly Patel MRN: [id number] Date of Birth: 06-Nov-1945  Transition of Care [facility]) CM/SW Contact  Loletha Grayer Beverely Pace, RN Phone Number: 09/27/2021, 12:08 PM  Clinical Narrative:   Case manager spoke with patient and her grandson,Tyler, concerning recommendation for Home Health therapy. Patient states she is going to stay with her sister- Irean Hong, in [place] and she doesn't want Home Health services. She states she has support and [facility] and has plenty of places to walk. No further needs identified.    Expected Discharge Plan: [place] Barriers to Discharge: No Barriers Identified  Expected Discharge Plan and Services Expected Discharge Plan: [place]         Expected Discharge Date: 09/27/21                         HH Arranged: Patient Refused [place]           Social Determinants of Health (SDOH) Interventions    Readmission Risk Interventions No flowsheet data found.

## 2021-09-27 NOTE — Progress Notes (Signed)
Discharge paperwork reviewed with pt and pt's son. Both verbalized understanding. Pt's son will transport pt home. Son has to go pick up pt a pair of pants to wear out of the hospital. Pt should be leaving within the hour.

## 2021-09-27 NOTE — Progress Notes (Signed)
Pt alert and oriented x4 in no acute distress upon discharge. Pt has taken her belongings with her. Pt's son has transported pt home via private vehicle.

## 2021-09-27 NOTE — Discharge Instructions (Signed)
Please go to

## 2021-09-28 ENCOUNTER — Other Ambulatory Visit: Payer: Self-pay | Admitting: Family Medicine

## 2021-09-28 LAB — BODY FLUID CULTURE W GRAM STAIN
Culture: NO GROWTH
Gram Stain: NONE SEEN

## 2021-09-28 LAB — CHOLESTEROL, BODY FLUID: Cholesterol, Fluid: 111 mg/dL

## 2021-09-30 DIAGNOSIS — R591 Generalized enlarged lymph nodes: Secondary | ICD-10-CM | POA: Diagnosis not present

## 2021-09-30 DIAGNOSIS — R1900 Intra-abdominal and pelvic swelling, mass and lump, unspecified site: Secondary | ICD-10-CM | POA: Diagnosis not present

## 2021-09-30 DIAGNOSIS — Z8709 Personal history of other diseases of the respiratory system: Secondary | ICD-10-CM | POA: Diagnosis not present

## 2021-09-30 DIAGNOSIS — Z6827 Body mass index (BMI) 27.0-27.9, adult: Secondary | ICD-10-CM | POA: Diagnosis not present

## 2021-10-01 ENCOUNTER — Telehealth: Payer: Self-pay | Admitting: Internal Medicine

## 2021-10-01 DIAGNOSIS — Z8709 Personal history of other diseases of the respiratory system: Secondary | ICD-10-CM | POA: Diagnosis not present

## 2021-10-01 LAB — SURGICAL PATHOLOGY

## 2021-10-01 LAB — CYTOLOGY - NON PAP

## 2021-10-01 NOTE — Telephone Encounter (Signed)
-   Called patient to review recent cytology results - Cytology consistent with B- cell lymphoma - I told patient & her daughter (who was with her) that she has lymphoma - She is aware of her appointment on 2/14 with Oncology - I asked her to write down any questions that come up and bring to that appointment. They don't have any additional questions right now - She is on her way to get CXR to assess pleural fluid re-accumulation, and her PCP will follow up that study

## 2021-10-02 ENCOUNTER — Emergency Department (HOSPITAL_COMMUNITY)
Admission: EM | Admit: 2021-10-02 | Discharge: 2021-10-02 | Disposition: A | Discharge status: Home / Self Care | Payer: Medicare PPO | Attending: Emergency Medicine | Admitting: Emergency Medicine

## 2021-10-02 ENCOUNTER — Emergency Department (HOSPITAL_COMMUNITY): Payer: Medicare PPO

## 2021-10-02 ENCOUNTER — Encounter (HOSPITAL_COMMUNITY): Payer: Self-pay | Admitting: Emergency Medicine

## 2021-10-02 ENCOUNTER — Other Ambulatory Visit: Payer: Self-pay

## 2021-10-02 DIAGNOSIS — Z7982 Long term (current) use of aspirin: Secondary | ICD-10-CM | POA: Insufficient documentation

## 2021-10-02 DIAGNOSIS — R109 Unspecified abdominal pain: Secondary | ICD-10-CM | POA: Diagnosis present

## 2021-10-02 DIAGNOSIS — C8513 Unspecified B-cell lymphoma, intra-abdominal lymph nodes: Secondary | ICD-10-CM | POA: Insufficient documentation

## 2021-10-02 DIAGNOSIS — J90 Pleural effusion, not elsewhere classified: Secondary | ICD-10-CM | POA: Insufficient documentation

## 2021-10-02 DIAGNOSIS — D72819 Decreased white blood cell count, unspecified: Secondary | ICD-10-CM | POA: Insufficient documentation

## 2021-10-02 DIAGNOSIS — Z79899 Other long term (current) drug therapy: Secondary | ICD-10-CM | POA: Diagnosis not present

## 2021-10-02 DIAGNOSIS — I10 Essential (primary) hypertension: Secondary | ICD-10-CM | POA: Insufficient documentation

## 2021-10-02 DIAGNOSIS — Z8673 Personal history of transient ischemic attack (TIA), and cerebral infarction without residual deficits: Secondary | ICD-10-CM | POA: Diagnosis not present

## 2021-10-02 DIAGNOSIS — R0602 Shortness of breath: Secondary | ICD-10-CM | POA: Diagnosis not present

## 2021-10-02 HISTORY — DX: Unspecified B-cell lymphoma, unspecified site: C85.10

## 2021-10-02 LAB — COMPREHENSIVE METABOLIC PANEL
ALT: 15 U/L (ref 0–44)
AST: 24 U/L (ref 15–41)
Albumin: 3.4 g/dL — ABNORMAL LOW (ref 3.5–5.0)
Alkaline Phosphatase: 62 U/L (ref 38–126)
Anion gap: 12 (ref 5–15)
BUN: 6 mg/dL — ABNORMAL LOW (ref 8–23)
CO2: 26 mmol/L (ref 22–32)
Calcium: 9.2 mg/dL (ref 8.9–10.3)
Chloride: 93 mmol/L — ABNORMAL LOW (ref 98–111)
Creatinine, Ser: 1.03 mg/dL — ABNORMAL HIGH (ref 0.44–1.00)
GFR, Estimated: 56 mL/min — ABNORMAL LOW (ref >60)
Glucose, Bld: 108 mg/dL — ABNORMAL HIGH (ref 70–99)
Potassium: 3.6 mmol/L (ref 3.5–5.1)
Sodium: 131 mmol/L — ABNORMAL LOW (ref 135–145)
Total Bilirubin: 0.9 mg/dL (ref 0.3–1.2)
Total Protein: 6.2 g/dL — ABNORMAL LOW (ref 6.5–8.1)

## 2021-10-02 LAB — CBC
HCT: 45.6 % (ref 36.0–46.0)
Hemoglobin: 15 g/dL (ref 12.0–15.0)
MCH: 29.3 pg (ref 26.0–34.0)
MCHC: 32.9 g/dL (ref 30.0–36.0)
MCV: 89.1 fL (ref 80.0–100.0)
Platelets: 178 10*3/uL (ref 150–400)
RBC: 5.12 MIL/uL — ABNORMAL HIGH (ref 3.87–5.11)
RDW: 14 % (ref 11.5–15.5)
WBC: 3.9 10*3/uL — ABNORMAL LOW (ref 4.0–10.5)
nRBC: 0 % (ref 0.0–0.2)

## 2021-10-02 LAB — TYPE AND SCREEN
ABO/RH(D): B POS
Antibody Screen: NEGATIVE

## 2021-10-02 LAB — SURGICAL PATHOLOGY

## 2021-10-02 NOTE — ED Triage Notes (Addendum)
Patient states she was told to go to the ED because "her blood was low", history of lymphoma. Patient unsure of details regarding why she was told to come to ED. Patient alert and in no apparent distress at this time.  Patient adds that she thinks she may have been sent for a blood transfusion.

## 2021-10-02 NOTE — ED Provider Notes (Signed)
[facility] EMERGENCY DEPARTMENT Provider Note   CSN: [id number] Arrival date & time: 10/02/21  0731     History  Chief Complaint  Patient presents with   Abnormal Lab    Molly Patel is a 76 y.o. female.  Patient referred in for reaccumulation of a large right pleural effusion.  Patient was admitted to the internal medicine service January 31 through February 3.  Patient had pleural fluid drained which gave confirmation of the B-cell lymphoma.  Patient was admitted at that time really for flank pain abdominal pain and CT scan showed multiple lymph nodes.  Showed the pleural effusion.  And patient was admitted for work-up.  According to family most likely that fluid was there for a while.  Patient had interaction with primary care provider yesterday who did a chest x-ray and showed that the fluid on the right side had accumulated back to the large level.  Patient with some shortness of breath with exertion but very comfortable at rest.  Patient has follow-up with hematology oncology on February 14 for the first time.  Patient also had CT angio of her chest during the last hospitalization.  No signs of pulmonary embolus.  Oxygen saturations here prior to me seeing her were in the range of 95% to 97%.  Put a pulse ox on her here and were getting around 94%.  Past medical history significant for hyperlipidemia hypertension stroke in 2016 and the new diagnosis of the B-cell lymphoma.    Patient is respiratory rate is also in the 16 range.  Patient in no significant distress.  Patient able to talk in full sentences.  No leg swelling.      Home Medications Prior to Admission medications   Medication Sig Start Date End Date Taking? Authorizing Provider  aspirin EC 325 MG tablet Take 1 tablet (325 mg total) by mouth daily. 05/28/15   Girard, Modena Nunnery, MD  atenolol (TENORMIN) 50 MG tablet TAKE 1 TABLET EVERY DAY Patient taking differently: Take 50 mg by mouth daily. 08/30/20    Alycia Rossetti, MD  cholecalciferol (VITAMIN D) 25 MCG (1000 UNIT) tablet TAKE 1 TABLET EVERY DAY Patient taking differently: 1,000 Units daily. 10/24/19   Esto, Modena Nunnery, MD  donepezil (ARICEPT) 5 MG tablet TAKE 1 TABLET AT BEDTIME Patient taking differently: Take 5 mg by mouth at bedtime. 08/09/20   [place], Modena Nunnery, MD  hydrochlorothiazide (MICROZIDE) 12.5 MG capsule TAKE 1 CAPSULE EVERY DAY Patient taking differently: Take 12.5 mg by mouth daily. 01/18/21   Susy Frizzle, MD  levETIRAcetam (KEPPRA) 500 MG tablet Take 1 tablet (500 mg total) by mouth 2 (two) times daily. Needs to establish with new PCP.  Dr. Buelah Manis is no longer at this office.  Must be seen before more refills. 08/05/21   Susy Frizzle, MD  memantine (NAMENDA) 5 MG tablet Take 5 mg by mouth daily. 08/09/21   [provider]  rosuvastatin (CRESTOR) 20 MG tablet Take 20 mg by mouth once a week. Thursday 07/22/21   [provider]      Allergies    Patient has no known allergies.    Review of Systems   Review of Systems  Constitutional:  Negative for chills and fever.  HENT:  Negative for ear pain and sore throat.   Eyes:  Negative for pain and visual disturbance.  Respiratory:  Positive for shortness of breath. Negative for cough.   Cardiovascular:  Negative for chest pain and  palpitations.  Gastrointestinal:  Negative for abdominal pain and vomiting.  Genitourinary:  Negative for dysuria and hematuria.  Musculoskeletal:  Negative for arthralgias and back pain.  Skin:  Negative for color change and rash.  Neurological:  Negative for seizures and syncope.  All other systems reviewed and are negative.  Physical Exam Updated Vital Signs BP (!) 171/88    Pulse 64    Temp 98.9 F (37.2 C) (Oral)    Resp 20    SpO2 93%  Physical Exam Vitals and nursing note reviewed.  Constitutional:      General: She is not in acute distress.    Appearance: Normal appearance. She is well-developed.   HENT:     Head: Normocephalic and atraumatic.  Eyes:     Conjunctiva/sclera: Conjunctivae normal.     Pupils: Pupils are equal, round, and reactive to light.  Cardiovascular:     Rate and Rhythm: Normal rate and regular rhythm.     Heart sounds: No murmur heard. Pulmonary:     Effort: Pulmonary effort is normal. No respiratory distress.     Breath sounds: Normal breath sounds. No stridor. No wheezing, rhonchi or rales.  Chest:     Chest wall: No tenderness.  Abdominal:     Palpations: Abdomen is soft.     Tenderness: There is no abdominal tenderness.  Musculoskeletal:        General: No swelling.     Cervical back: Normal range of motion and neck supple.     Right lower leg: No edema.     Left lower leg: No edema.  Skin:    General: Skin is warm and dry.     Capillary Refill: Capillary refill takes less than 2 seconds.  Neurological:     General: No focal deficit present.     Mental Status: She is alert and oriented to person, place, and time.     Cranial Nerves: No cranial nerve deficit.     Sensory: No sensory deficit.     Motor: No weakness.  Psychiatric:        Mood and Affect: Mood normal.    ED Results / Procedures / Treatments   Labs (all labs ordered are listed, but only abnormal results are displayed) Labs Reviewed  COMPREHENSIVE METABOLIC PANEL - Abnormal; Notable for the following components:      Result Value   Sodium 131 (*)    Chloride 93 (*)    Glucose, Bld 108 (*)    BUN 6 (*)    Creatinine, Ser 1.03 (*)    Total Protein 6.2 (*)    Albumin 3.4 (*)    GFR, Estimated 56 (*)    All other components within normal limits  CBC - Abnormal; Notable for the following components:   WBC 3.9 (*)    RBC 5.12 (*)    All other components within normal limits  POC OCCULT BLOOD, ED  TYPE AND SCREEN    EKG None  Radiology DG Chest 2 View  Result Date: 10/02/2021 CLINICAL DATA:  shortness of breath EXAM: CHEST - 2 VIEW COMPARISON:  Chest radiographs from  October 01, 2021 and September 25, 2021. CT of the chest September 25, 2021. FINDINGS: Large right pleural effusion, similar to yesterday and increased since February 1. Mild overlying opacities, likely atelectasis. Left lung is clear. No visible pneumothorax. Cardiac silhouette is partly obscured. Polyarticular degenerative change. Known paraspinal mass better characterized on prior CT. IMPRESSION: 1. Large right pleural effusion, similar to  yesterday and increased since February 1. 2. Probable overlying atelectasis. 3. Known paraspinal mass better characterized on recent CT. Electronically Signed   By: Margaretha Sheffield M.D.   On: 10/02/2021 11:49    Procedures Procedures    Medications Ordered in ED Medications - No data to display  ED Course/ Medical Decision Making/ A&P                           Medical Decision Making Amount and/or Complexity of Data Reviewed Labs: ordered. Radiology: ordered.   Patient not tachypneic not hypoxic no distress at rest.  Chest x-ray today shows that the right pleural effusion is large again there is nothing on the left side.  It is back to the level it was at prior to being drained during the hospitalization here in early February.  Patient's labs without significant abnormalities.  No fevers.  White blood cell count 3.9.  Platelets are 178,000.  Patient has follow-up next week with oncology for the first time.  Patient seems to be tolerating this largely right-sided pleural effusion well.  Patient may end up needing a permanent catheter to drain it on a regular basis.  But do not feel it needs to be done today on emergent basis.  Patient will return for pulse ox below 90% at rest.  Or any increase or worsening breathing difficulties at rest.  Patient doing quite well currently.  And also to return for any fevers.  They will keep their appointment to follow-up with oncology on February 14. Final Clinical Impression(s) / ED Diagnoses Final diagnoses:  B-cell  lymphoma of intra-abdominal lymph nodes, unspecified B-cell lymphoma type [facility])    Rx / DC Orders ED Discharge Orders     None         Fredia Sorrow, MD 10/02/21 1340

## 2021-10-02 NOTE — Discharge Instructions (Addendum)
Keep your appointment to follow-up with oncology as scheduled at [facility] on February 14.  As we discussed return for any worsening difficulty breathing or struggling to breathe while at rest.  Also can use your pulse ox measurement at home.  If she appears in distress you can check it.  Oxygen saturations consistently below 90% are abnormal.

## 2021-10-08 ENCOUNTER — Encounter (HOSPITAL_COMMUNITY): Payer: Self-pay | Admitting: Hematology

## 2021-10-08 ENCOUNTER — Inpatient Hospital Stay (HOSPITAL_COMMUNITY): Payer: Medicare PPO

## 2021-10-08 ENCOUNTER — Other Ambulatory Visit: Payer: Self-pay

## 2021-10-08 ENCOUNTER — Inpatient Hospital Stay (HOSPITAL_COMMUNITY): Payer: Medicare PPO | Attending: Hematology | Admitting: Hematology

## 2021-10-08 VITALS — BP 136/74 | HR 77 | Temp 96.8°F | Resp 17 | Ht 66.5 in | Wt 173.0 lb

## 2021-10-08 DIAGNOSIS — C8330 Diffuse large B-cell lymphoma, unspecified site: Secondary | ICD-10-CM | POA: Insufficient documentation

## 2021-10-08 DIAGNOSIS — Z87891 Personal history of nicotine dependence: Secondary | ICD-10-CM | POA: Insufficient documentation

## 2021-10-08 DIAGNOSIS — Z7982 Long term (current) use of aspirin: Secondary | ICD-10-CM | POA: Diagnosis not present

## 2021-10-08 DIAGNOSIS — J90 Pleural effusion, not elsewhere classified: Secondary | ICD-10-CM | POA: Diagnosis not present

## 2021-10-08 DIAGNOSIS — R0789 Other chest pain: Secondary | ICD-10-CM | POA: Insufficient documentation

## 2021-10-08 DIAGNOSIS — Z79899 Other long term (current) drug therapy: Secondary | ICD-10-CM | POA: Insufficient documentation

## 2021-10-08 DIAGNOSIS — Z86011 Personal history of benign neoplasm of the brain: Secondary | ICD-10-CM | POA: Diagnosis not present

## 2021-10-08 DIAGNOSIS — C8510 Unspecified B-cell lymphoma, unspecified site: Secondary | ICD-10-CM

## 2021-10-08 LAB — CBC WITH DIFFERENTIAL/PLATELET
Abs Immature Granulocytes: 0.01 10*3/uL (ref 0.00–0.07)
Basophils Absolute: 0 10*3/uL (ref 0.0–0.1)
Basophils Relative: 1 %
Eosinophils Absolute: 0.1 10*3/uL (ref 0.0–0.5)
Eosinophils Relative: 3 %
HCT: 45.8 % (ref 36.0–46.0)
Hemoglobin: 15.2 g/dL — ABNORMAL HIGH (ref 12.0–15.0)
Immature Granulocytes: 0 %
Lymphocytes Relative: 13 %
Lymphs Abs: 0.5 10*3/uL — ABNORMAL LOW (ref 0.7–4.0)
MCH: 29.8 pg (ref 26.0–34.0)
MCHC: 33.2 g/dL (ref 30.0–36.0)
MCV: 89.8 fL (ref 80.0–100.0)
Monocytes Absolute: 0.4 10*3/uL (ref 0.1–1.0)
Monocytes Relative: 11 %
Neutro Abs: 2.5 10*3/uL (ref 1.7–7.7)
Neutrophils Relative %: 72 %
Platelets: 160 10*3/uL (ref 150–400)
RBC: 5.1 MIL/uL (ref 3.87–5.11)
RDW: 14 % (ref 11.5–15.5)
WBC: 3.5 10*3/uL — ABNORMAL LOW (ref 4.0–10.5)
nRBC: 0 % (ref 0.0–0.2)

## 2021-10-08 LAB — COMPREHENSIVE METABOLIC PANEL
ALT: 15 U/L (ref 0–44)
AST: 23 U/L (ref 15–41)
Albumin: 3.8 g/dL (ref 3.5–5.0)
Alkaline Phosphatase: 83 U/L (ref 38–126)
Anion gap: 15 (ref 5–15)
BUN: 16 mg/dL (ref 8–23)
CO2: 24 mmol/L (ref 22–32)
Calcium: 9.2 mg/dL (ref 8.9–10.3)
Chloride: 89 mmol/L — ABNORMAL LOW (ref 98–111)
Creatinine, Ser: 1 mg/dL (ref 0.44–1.00)
GFR, Estimated: 58 mL/min — ABNORMAL LOW (ref >60)
Glucose, Bld: 88 mg/dL (ref 70–99)
Potassium: 3.7 mmol/L (ref 3.5–5.1)
Sodium: 128 mmol/L — ABNORMAL LOW (ref 135–145)
Total Bilirubin: 0.9 mg/dL (ref 0.3–1.2)
Total Protein: 6.8 g/dL (ref 6.5–8.1)

## 2021-10-08 LAB — HEPATITIS B SURFACE ANTIGEN: Hepatitis B Surface Ag: NONREACTIVE

## 2021-10-08 LAB — HEPATITIS C ANTIBODY: HCV Ab: NONREACTIVE

## 2021-10-08 LAB — HEPATITIS B SURFACE ANTIBODY,QUALITATIVE: Hep B S Ab: NONREACTIVE

## 2021-10-08 LAB — LACTATE DEHYDROGENASE: LDH: 556 U/L — ABNORMAL HIGH (ref 98–192)

## 2021-10-08 LAB — HEPATITIS B CORE ANTIBODY, TOTAL: Hep B Core Total Ab: NONREACTIVE

## 2021-10-08 LAB — URIC ACID: Uric Acid, Serum: 6.8 mg/dL (ref 2.5–7.1)

## 2021-10-08 NOTE — Patient Instructions (Addendum)
[place] at [facility] Discharge Instructions  You were seen and examined today by Dr. Delton Coombes. Dr. Delton Coombes is a medical oncologist, meaning that he specializes in the treatment of cancer diagnoses. Dr. Delton Coombes discussed your past medical history, family history of cancers, and the events that led to you being here today.  You have been diagnosed with Large B-cell Lymphoma. This is a fast growing type of cancer arising within the lymph nodes typically treated with chemotherapy. Because it is fast growing, it responds well to chemotherapy and is most often curable.  As of right now, it is not clear what the current stage of the cancer is. Dr. Delton Coombes has recommended additional testing.   Dr. Delton Coombes has recommended additional lab work today. As well as an echocardiogram, this is an ultrasound of the heart to get a baseline cardiac function prior to chemotherapy administration. Dr. Delton Coombes has also recommended a PET scan. A PET scan is a specialized CT scan that illuminates where there is cancer present in the body. This will identify the stage of the cancer.  Prior to beginning any type of chemotherapy, you must have a Port-A-Cath. A Port-A-Cath is the safest and most effective way to administer chemotherapy.   Follow-up with Dr. Delton Coombes as scheduled following scans.   Thank you for choosing [place] at [facility] to provide your oncology and hematology care.  To afford each patient quality time with our provider, please arrive at least 15 minutes before your scheduled appointment time.   If you have a lab appointment with the [facility] please come in thru the Main Entrance and check in at the main information desk.  You need to re-schedule your appointment should you arrive 10 or more minutes late.  We strive to give you quality time with our providers, and arriving late affects you and other patients whose appointments  are after yours.  Also, if you no show three or more times for appointments you may be dismissed from the clinic at the providers discretion.     Again, thank you for choosing [facility].  Our hope is that these requests will decrease the amount of time that you wait before being seen by our physicians.       _____________________________________________________________  Should you have questions after your visit to [facility], please contact our office at [phone] and follow the prompts.  Our office hours are 8:00 a.m. and 4:30 p.m. Monday - Friday.  Please note that voicemails left after 4:00 p.m. may not be returned until the following business day.  We are closed weekends and major holidays.  You do have access to a nurse 24-7, just call the main number to the clinic [phone] and do not press any options, hold on the line and a nurse will answer the phone.    For prescription refill requests, have your pharmacy contact our office and allow 72 hours.    Due to Covid, you will need to wear a mask upon entering the hospital. If you do not have a mask, a mask will be given to you at the Main Entrance upon arrival. For doctor visits, patients may have 1 support person age 78 or older with them. For treatment visits, patients can not have anyone with them due to social distancing guidelines and our immunocompromised population.

## 2021-10-08 NOTE — Progress Notes (Signed)
[facility] [address], [place] [address]   CLINIC:  Medical Oncology/Hematology  CONSULT NOTE  Patient Care Team: [place] Nation, MD as PCP - General (Internal Medicine) Elsie Stain, MD (Pulmonary Disease) Derek Jack, MD as Medical Oncologist (Medical Oncology) Brien Mates, RN as Oncology Nurse Navigator (Medical Oncology)  CHIEF COMPLAINTS/PURPOSE OF CONSULTATION:  Newly diagnosed large B-cell lymphoma  HISTORY OF PRESENTING ILLNESS:  [name] 76 y.o. female is here because of evaluation of retroperitoneal mass, at the request of Inpatient.  Today she reports feeling well, and she is accompanied by her son and her niece who also acts as her caretaker. In December 2022 she was diagnosed with a UTI; an ultrasound was done and a mass was found on her kidney. She has had RUQ pain and SOB which has been worsening since December for which she is currently taking Hydrocodone. Her niece reports she has constipation and difficulty urinating. Her niece reports she as a history of seizures. Her niece reports her O2 is 89-90 while sitting up and 81 when laying down, so they have been trying to keep her sitting upright as much as possible. Her constipation has improved which has improved the RUQ pain somewhat. At home she is currently inactive and cannot walk without assistance since December. Prior to December she living independently, and she was active and exercising. She denies headaches and vision changes. She has a benign brain tumor removed about 10 years ago following which her niece reports she has had difficulty with short term memory. Her son reports she had a hysterectomy 30 years ago. She reports pain in her on the right side of her lower back upon deep inspiration. She reports a previous possible CVA in 2020 at which time she experienced left arm weakness and a syncopal episode. She denies fevers and night sweats. She has lost 12  lbs since 09/24/2021. Her niece reports she is not eating well. She denies tingling/numbness.   She currently lives with her niece and sister. 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 She denies smoking history. Her brother had throat cancer, and another brother had liver cancer. She denies excessive or unusual chemical exposure.   MEDICAL HISTORY:  Past Medical History:  Diagnosis Date   Angioedema    B-cell lymphoma ([place])    Depression    Dizziness    Echocardiogram with ECG monitoring 02/23/2011   mild LVH, normal event monitor    Hyperlipidemia    Hypertension    takes meds daily   Incontinence    Insomnia    Ovarian cyst    Seizures ([place])    2 weeks ago   Stroke ([place]) 08/25/2014   Syncope    Vitamin D deficiency     SURGICAL HISTORY: Past Surgical History:  Procedure Laterality Date   ABDOMINAL HYSTERECTOMY     COLONOSCOPY N/A 11/02/2014   Procedure: COLONOSCOPY;  Surgeon: Rogene Houston, MD;  [facility];  Service: Endoscopy;  Laterality: N/A;  140 - moved to 12:55 - Ann to notify pt   CRANIOTOMY  06/29/2012   Procedure: CRANIOTOMY TUMOR EXCISION;  Surgeon: Faythe Ghee, MD;  Location: [facility];  Service: Neurosurgery;  Laterality: Left;  Left frontal Craniotomy for Meningioma   ESOPHAGEAL DILATION N/A 11/02/2014   Procedure: ESOPHAGEAL DILATION;  Surgeon: Rogene Houston, MD;  Location: [facility];  Service: Endoscopy;  Laterality: N/A;   ESOPHAGOGASTRODUODENOSCOPY N/A 11/02/2014   Procedure: ESOPHAGOGASTRODUODENOSCOPY (EGD);  Surgeon: Rogene Houston, MD;  Location[facility];  Service: Endoscopy;  Laterality: N/A;   VESICOVAGINAL FISTULA CLOSURE W/ TAH      SOCIAL HISTORY: Social History   Socioeconomic History   Marital status: Divorced    Spouse name: Not on file   Number of children: 2   Years of education: Not on file   Highest education level: Not on file  Occupational History    Occupation: Retired    Comment: Tree surgeon  Tobacco Use   Smoking status: Former    Years: 7.00    Types: Cigarettes    Quit date: 08/25/1985    Years since quitting: 36.1   Smokeless tobacco: Never  Substance and Sexual Activity   Alcohol use: No   Drug use: No   Sexual activity: Not on file  Other Topics Concern   Not on file  Social History Narrative   Not on file   Social Determinants of Health   Financial Resource Strain: Not on file  Food Insecurity: Not on file  Transportation Needs: Not on file  Physical Activity: Not on file  Stress: Not on file  Social Connections: Not on file  Intimate Partner Violence: Not on file    FAMILY HISTORY: Family History  Problem Relation Age of Onset   Stroke Father    Asthma Grandchild    Arthritis Sister    Diabetes Sister    Colon cancer Brother     ALLERGIES:  has No Known Allergies.  MEDICATIONS:  Current Outpatient Medications  Medication Sig Dispense Refill   aspirin EC 325 MG tablet Take 1 tablet (325 mg total) by mouth daily. 30 tablet 0   atenolol (TENORMIN) 50 MG tablet TAKE 1 TABLET EVERY DAY (Patient taking differently: Take 50 mg by mouth daily.) 90 tablet 2   cholecalciferol (VITAMIN D) 25 MCG (1000 UNIT) tablet TAKE 1 TABLET EVERY DAY (Patient taking differently: 1,000 Units daily.) 90 tablet 3   donepezil (ARICEPT) 5 MG tablet TAKE 1 TABLET AT BEDTIME (Patient taking differently: Take 5 mg by mouth at bedtime.) 90 tablet 1   hydrochlorothiazide (MICROZIDE) 12.5 MG capsule TAKE 1 CAPSULE EVERY DAY (Patient taking differently: Take 12.5 mg by mouth daily.) 90 capsule 0   HYDROcodone-acetaminophen (NORCO/VICODIN) 5-325 MG tablet Take 1 tablet by mouth.     levETIRAcetam (KEPPRA) 500 MG tablet Take 1 tablet (500 mg total) by mouth 2 (two) times daily. Needs to establish with new PCP.  Dr. Buelah Manis is no longer at this office.  Must be seen before more refills. 60 tablet 0   memantine (NAMENDA) 5 MG tablet Take  5 mg by mouth daily.     rosuvastatin (CRESTOR) 20 MG tablet Take 20 mg by mouth once a week. Thursday     No current facility-administered medications for this visit.    REVIEW OF SYSTEMS:   Review of Systems  Constitutional:  Positive for appetite change, fatigue and unexpected weight change (-12 lbs). Negative for fever.  Respiratory:  Positive for cough and shortness of breath.   Gastrointestinal:  Positive for abdominal pain (RUQ) and constipation.  Endocrine: Negative for hot flashes.  Genitourinary:  Positive for difficulty urinating.   Neurological:  Negative for numbness.  All other systems reviewed and are negative.   PHYSICAL EXAMINATION: ECOG PERFORMANCE STATUS: 1 - Symptomatic but completely ambulatory  Vitals:   10/08/21 0838  BP: 136/74  Pulse: 77  Resp: 17  Temp: (!) 96.8 F (36 C)  SpO2: 93%   Filed Weights   10/08/21 0838  Weight: 173 lb (78.5 kg)   Physical Exam Vitals reviewed.  Constitutional:      Appearance: Normal appearance.     Comments: In wheelchair  Cardiovascular:     Rate and Rhythm: Normal rate and regular rhythm.     Pulses: Normal pulses.     Heart sounds: Normal heart sounds.  Pulmonary:     Effort: Pulmonary effort is normal.     Breath sounds: Examination of the right-upper field reveals decreased breath sounds. Examination of the right-middle field reveals decreased breath sounds. Decreased breath sounds present.  Abdominal:     Palpations: Abdomen is soft. There is no hepatomegaly, splenomegaly or mass.  Musculoskeletal:     Right lower leg: No edema.     Left lower leg: No edema.  Lymphadenopathy:     Cervical: No cervical adenopathy.     Right cervical: No superficial cervical adenopathy.    Left cervical: No superficial cervical adenopathy.     Upper Body:     Right upper body: No supraclavicular adenopathy.     Left upper body: No supraclavicular adenopathy.     Lower Body: No right inguinal adenopathy. No left  inguinal adenopathy.  Neurological:     General: No focal deficit present.     Mental Status: She is alert and oriented to person, place, and time.  Psychiatric:        Mood and Affect: Mood normal.        Behavior: Behavior normal.     LABORATORY DATA:  I have reviewed the data as listed CBC Latest Ref Rng & Units 10/02/2021 09/26/2021 09/24/2021  WBC 4.0 - 10.5 K/uL 3.9(L) 3.7(L) 3.1(L)  Hemoglobin 12.0 - 15.0 g/dL 15.0 14.6 15.4(H)  Hematocrit 36.0 - 46.0 % 45.6 43.4 47.5(H)  Platelets 150 - 400 K/uL 178 159 148(L)   CMP Latest Ref Rng & Units 10/02/2021 09/26/2021 09/25/2021  Glucose 70 - 99 mg/dL 108(H) 91 -  BUN 8 - 23 mg/dL 6(L) 8 -  Creatinine 0.44 - 1.00 mg/dL 1.03(H) 0.94 -  Sodium 135 - 145 mmol/L 131(L) 135 -  Potassium 3.5 - 5.1 mmol/L 3.6 3.6 -  Chloride 98 - 111 mmol/L 93(L) 101 -  CO2 22 - 32 mmol/L 26 25 -  Calcium 8.9 - 10.3 mg/dL 9.2 9.0 -  Total Protein 6.5 - 8.1 g/dL 6.2(L) 5.9(L) 6.0(L)  Total Bilirubin 0.3 - 1.2 mg/dL 0.9 1.1 -  Alkaline Phos 38 - 126 U/L 62 56 -  AST 15 - 41 U/L 24 21 -  ALT 0 - 44 U/L 15 14 -    RADIOGRAPHIC STUDIES: I have personally reviewed the radiological images as listed and agreed with the findings in the report. DG Chest 2 View  Result Date: 10/02/2021 CLINICAL DATA:  shortness of breath EXAM: CHEST - 2 VIEW COMPARISON:  Chest radiographs from October 01, 2021 and September 25, 2021. CT of the chest September 25, 2021. FINDINGS: Large right pleural effusion, similar to yesterday and increased since February 1. Mild overlying opacities, likely atelectasis. Left lung is clear. No visible pneumothorax. Cardiac silhouette is partly obscured. Polyarticular degenerative change. Known paraspinal mass better characterized on prior CT. IMPRESSION: 1. Large right pleural effusion, similar to yesterday and increased since February 1. 2. Probable overlying atelectasis. 3. Known paraspinal mass better characterized on recent CT. Electronically Signed  By:  Margaretha Sheffield M.D.   On: 10/02/2021 11:49   DG Chest 2 View  Result Date: 09/24/2021 CLINICAL DATA:  A 76 year old female presents with chest pain and shortness of breath. EXAM: CHEST - 2 VIEW COMPARISON:  June 29, 2012. Also with CT of the abdomen and pelvis of September 24, 2021. FINDINGS: Trachea is midline. Cardiomediastinal contours and hilar structures with obscured RIGHT hilum. Relatively normal appearing LEFT hilum but with widening of the paraspinal stripe in the LEFT chest likely due to paraspinal soft tissue seen on the previous CT of the abdomen and pelvis. Dense opacification of the lower 2/3 of the RIGHT hemithorax. LEFT chest with relatively normal appearance, no opacity or effusion other than some thickening of the paraspinal stripe on the LEFT. No pneumothorax. On limited assessment there is no acute skeletal process. IMPRESSION: 1. Dense opacification of 2/3 of the RIGHT hemithorax, inferior RIGHT chest in this patient with known malignant appearing RIGHT-sided effusion and basilar airspace disease. 2. Mild thickening of the paraspinal stripe on the LEFT likely due to soft tissue along the inferior thoracic spine. CT imaging of the chest may be helpful for further evaluation. Electronically Signed   By: Zetta Bills M.D.   On: 09/24/2021 18:16   CT Angio Chest PE W and/or Wo Contrast  Result Date: 09/25/2021 CLINICAL DATA:  Chest pain, shortness of breath EXAM: CT ANGIOGRAPHY CHEST WITH CONTRAST TECHNIQUE: Multidetector CT imaging of the chest was performed using the standard protocol during bolus administration of intravenous contrast. Multiplanar CT image reconstructions and MIPs were obtained to evaluate the vascular anatomy. RADIATION DOSE REDUCTION: This exam was performed according to the departmental dose-optimization program which includes automated exposure control, adjustment of the mA and/or kV according to patient size and/or use of iterative reconstruction technique.  CONTRAST:  23m OMNIPAQUE IOHEXOL 350 MG/ML SOLN COMPARISON:  CT abdomen 09/24/2021 FINDINGS: Cardiovascular: Satisfactory opacification of the pulmonary arteries to the segmental level. No evidence of pulmonary embolism. Normal heart size. No pericardial effusion. Mediastinum/Nodes: Mediastinum is shifted towards the left. No enlarged mediastinal, hilar, or axillary lymph nodes. Thyroid gland, trachea, and esophagus demonstrate no significant findings. Large right retroperitoneal soft tissue mass measuring 4.4 x 5.8 x 7 cm which extends superiorly along the right para-aortic region through the diaphragmatic hiatus into the thorax and pleural space. Lungs/Pleura: Large right pleural effusion with partial collapse of the right lung with only a small area of aerated right upper lobe remaining. Severe soft tissue pleural thickening along the posterior aspect of the right pleural space measuring 2.8 cm in maximal thickness extending into the posterior mediastinum. 4.2 x 3.1 cm right pleural based mass along the anteromedial aspect abutting the pericardium. Upper Abdomen: No acute abnormality. Musculoskeletal: No acute osseous abnormality. No aggressive osseous lesion. Right posterior pleural mass abuts the mid and lower thoracic spine. Review of the MIP images confirms the above findings. IMPRESSION: 1. No acute pulmonary embolus. 2. Large right pleural effusion with partial collapse of the right lung with only a small area of aerated right upper lobe remaining. 3. Partially visualized, large right retroperitoneal soft tissue mass measuring 4.4 x 5.8 x 7 cm which extends superiorly along the right para-aortic region through the diaphragmatic hiatus into the thorax and pleural space. Severe soft tissue pleural thickening along the posterior aspect of the right pleural space measuring 2.8 cm in maximum thickness. 4.2 x 3.1 cm right pleural based mass along the anteromedial aspect abutting the pericardium. Findings are  concerning  for malignancy. Electronically Signed   By: Kathreen Devoid M.D.   On: 09/25/2021 06:14   CT BIOPSY  Result Date: 09/26/2021 CLINICAL DATA:  Posterior mediastinal and retroperitoneal mass and adenopathy EXAM: CT GUIDED CORE BIOPSY OF RETROPERITONEAL MASS ANESTHESIA/SEDATION: Intravenous Fentanyl 152mg and Versed 217mwere administered as conscious sedation during continuous monitoring of the patient's level of consciousness and physiological / cardiorespiratory status by the radiology RN, with a total moderate sedation time of 15 minutes. PROCEDURE: The procedure risks, benefits, and alternatives were explained to the patient. Questions regarding the procedure were encouraged and answered. The patient understands and consents to the procedure. Patient placed prone. Select axial scans through the upper abdomen were obtained. The right posterior pleural/retroperitoneal mass was localized and an appropriate skin entry site was determined and marked. The operative field was prepped with chlorhexidinein a sterile fashion, and a sterile drape was applied covering the operative field. A sterile gown and sterile gloves were used for the procedure. Local anesthesia was provided with 1% Lidocaine. Under CT fluoroscopic guidance, a 17 gauge trocar needle was advanced to the margin of the lesion. Once needle tip position was confirmed, coaxial 18-gauge core biopsy samples were obtained, submitted in saline to surgical pathology. The guide needle was removed. Postprocedure images show no hemorrhage or other apparent complication. The patient tolerated the procedure well. RADIATION DOSE REDUCTION: This exam was performed according to the departmental dose-optimization program which includes automated exposure control, adjustment of the mA and/or kV according to patient size and/or use of iterative reconstruction technique. COMPLICATIONS: None immediate FINDINGS: Right posterior pleural and contiguous retroperitoneal  mass was localized. Representative core biopsy samples obtained under CT guidance. IMPRESSION: 1. Technically successful CT-guided core biopsy, right retroperitoneal mass. Electronically Signed   By: D Lucrezia Europe.D.   On: 09/26/2021 15:54   DG Chest Port 1 View  Result Date: 09/25/2021 CLINICAL DATA:  Status post thoracentesis EXAM: PORTABLE CHEST 1 VIEW COMPARISON:  09/24/2021 FINDINGS: Significant decrease in the right pleural effusion with a small amount of pleural fluid remaining. Right basilar airspace disease. Persistent right pleural thickening along the paramediastinal aspect. Left lung is clear. No pneumothorax. Stable cardiomediastinal silhouette. No acute osseous abnormality. IMPRESSION: 1. Significant decrease in the right pleural effusion with a small amount of pleural fluid remaining. No pneumothorax. 2. Persistent right basilar airspace disease. Electronically Signed   By: HeKathreen Devoid.D.   On: 09/25/2021 12:52   CT Renal Stone Study  Result Date: 09/24/2021 CLINICAL DATA:  Right flank pain. EXAM: CT ABDOMEN AND PELVIS WITHOUT CONTRAST TECHNIQUE: Multidetector CT imaging of the abdomen and pelvis was performed following the standard protocol without IV contrast. RADIATION DOSE REDUCTION: This exam was performed according to the departmental dose-optimization program which includes automated exposure control, adjustment of the mA and/or kV according to patient size and/or use of iterative reconstruction technique. COMPARISON:  None. FINDINGS: Lower chest: There is a moderate right pleural effusion. A soft tissue rind is identified overlying which overlies the posterior and medial right lung with overlying moderate right pleural effusion. Tumor rind extends into the posterior mediastinum, image 16/3. Hepatobiliary: No focal liver abnormality identified. Gallbladder appears normal. No bile duct dilatation. Pancreas: Pancreas is unremarkable. Spleen: Spleen measures 9.8 cm in cranial caudal  dimension. No focal splenic lesion. Adrenals/Urinary Tract: Normal appearance of the adrenal glands. No signs of nephrolithiasis or hydronephrosis. Urinary bladder appears normal for degree of distension. Stomach/Bowel: Stomach appears within normal limits. The appendix is not confidently identified.  No bowel wall thickening, inflammation, or distension. Vascular/Lymphatic: Aortic atherosclerosis without aneurysm. Large right retroperitoneal nodal mass measures 4.4 by 5.7 by 7.1 cm, image 31/3 and image 71/3. Within the central small bowel mesentery there is a conglomeration of enlarged lymph nodes/mass which measures 2.1 x 6.3 by 7.1 cm, image 48/3 and image 36/6. Right pelvic sidewall lymph node is borderline enlarged measuring 1 cm, image 70/3. Reproductive: The uterus appears surgically absent. Indeterminate ovoid mass within the left adnexal region measures 3.7 x 2.1 cm, image 69/3. Other: No significant free fluid identified. Musculoskeletal: Degenerative disc disease noted at L5-S1. No aggressive lytic or sclerotic bone lesions. IMPRESSION: 1. Large nodal masses identified within right retroperitoneum and small bowel mesentery. Findings are concerning for either lymphoproliferative disorder or metastatic disease. Additionally, there is a rind of increased soft tissue overlying the posteromedial right lower lobe with extension into the posterior mediastinum. This is also worrisome for malignancy. Further evaluation with PET-CT and tissue sampling is recommended. 2. A moderate right pleural effusion overlies the soft tissue rind within the right hemithorax. Consider further evaluation with diagnostic thoracentesis. 3. Indeterminate ovoid mass within the left adnexal region measures 3.7 x 2.1 cm. Attention in this area on PET-CT is advised. 4. Borderline splenomegaly. 5. Aortic Atherosclerosis (ICD10-I70.0). Electronically Signed   By: Kerby Moors M.D.   On: 09/24/2021 16:58    ASSESSMENT:  Stage IIIb  DLBCL: - Weakness, 12 pound weight loss over the last 2 months - CT renal study on 09/24/2021: Right retroperitoneal nodal mass measuring 4.4 x 5.7 x 7.1 cm.  Enlarged lymph node mass in the central small bowel mesentery measuring 2.1 x 6.3 x 7.1 cm.  Right pelvic sidewall lymph node borderline enlarged measuring 1 cm.  Indeterminate ovoid mass in the left adnexal region measuring 3.7 x 2.1 cm. - CT chest angiogram on 09/25/2021 negative for PE.  Large right pleural effusion with partial collapse of the right lung.  Partially seen large right retroperitoneal soft tissue mass measuring 4.4 x 5.8 x 7 cm extends superiorly along the right para-aortic region through the diaphragmatic hiatus into the thorax and pleural space.  Severe soft tissue pleural thickening along the posterior aspect of the right pleural space measuring 2.8 cm in maximal thickness.  4.2 x 3.1 cm right pleural-based mass along the anteromedial aspect abutting the pericardium. - Right pleural fluid flow cytometry-monoclonal B-cell population with coexpression of CD10 comprising 69% of all lymphocytes. - Right retroperitoneal mass needle biopsy on 09/26/2021 consistent with large B-cell type (GCB subtype) is favored.  Large lymphoid cells are positive for CD79a, CD20, CD10, BCL6 and Bcl-2.  Ki-67 up to 50% in some areas.   Social/family history: - She [facility] of [facility]. - She is currently living with her sister and niece.  Niece takes care of her.  She is a retired Occupational psychologist.  No exposure to chemicals.  Non-smoker. - Brother had throat cancer.  Another brother had liver cancer.   PLAN:  Diffuse large B-cell lymphoma: - We have reviewed images with the patient, her son and her niece. - We have also discussed pathology report in detail. - Recommend scans for further work-up including PET CT scan and a brain MRI with and without contrast as she has high  risk feature for CNS disease.  If there is any abnormalities on the MRI, will consider lumbar puncture and flow cytometry. -  Also recommend LDH, uric acid, hepatitis B and C serology. - Recommend echocardiogram and port placement. - Discussed treatment with R-CHOP based regimen briefly. - RTC after scans to discuss further plan.  2.  Right pleural effusion: - I could not hear breath sounds in two thirds of her lung.  Her saturations are 93% on room air.  She reports shortness of breath when lying down.  She can breathe better when sitting upright. - Recommend therapeutic thoracentesis.  3.  Right lateral chest wall pain: - She had right lateral chest wall pain requiring hydrocodone.  Now she is only requiring it at nighttime.   All questions were answered. The patient knows to call the clinic with any problems, questions or concerns.   Derek Jack, MD, 10/08/21 9:48 AM  [place] [phone]   I, Thana Ates, am acting as a scribe for Dr. Derek Jack.  I, Derek Jack MD, have reviewed the above documentation for accuracy and completeness, and I agree with the above.

## 2021-10-09 ENCOUNTER — Ambulatory Visit (HOSPITAL_COMMUNITY): Admission: RE | Admit: 2021-10-09 | Payer: Medicare PPO | Source: Ambulatory Visit

## 2021-10-10 ENCOUNTER — Other Ambulatory Visit: Payer: Self-pay

## 2021-10-10 ENCOUNTER — Ambulatory Visit (HOSPITAL_COMMUNITY)
Admission: RE | Admit: 2021-10-10 | Discharge: 2021-10-10 | Disposition: A | Discharge status: Home / Self Care | Payer: Medicare PPO | Source: Ambulatory Visit | Attending: Hematology | Admitting: Hematology

## 2021-10-10 ENCOUNTER — Encounter (HOSPITAL_COMMUNITY): Payer: Self-pay

## 2021-10-10 ENCOUNTER — Ambulatory Visit (HOSPITAL_COMMUNITY)
Admission: RE | Admit: 2021-10-10 | Discharge: 2021-10-10 | Disposition: A | Discharge status: Home / Self Care | Payer: Medicare PPO | Source: Ambulatory Visit | Attending: Interventional Radiology | Admitting: Interventional Radiology

## 2021-10-10 DIAGNOSIS — J910 Malignant pleural effusion: Secondary | ICD-10-CM | POA: Diagnosis not present

## 2021-10-10 DIAGNOSIS — Z9889 Other specified postprocedural states: Secondary | ICD-10-CM

## 2021-10-10 DIAGNOSIS — J90 Pleural effusion, not elsewhere classified: Secondary | ICD-10-CM | POA: Diagnosis not present

## 2021-10-10 DIAGNOSIS — C8510 Unspecified B-cell lymphoma, unspecified site: Secondary | ICD-10-CM

## 2021-10-10 DIAGNOSIS — J948 Other specified pleural conditions: Secondary | ICD-10-CM | POA: Diagnosis not present

## 2021-10-10 NOTE — Progress Notes (Signed)
PT tolerated right sided thoracentesis procedure well today and 1.5 Liters of clear amber colored fluid removed with labs collected and sent for processing. PT verbalized understanding of discharge instructions and went for post chest xray at this time via wheelchair with no acute distress noted.

## 2021-10-10 NOTE — Procedures (Signed)
Vascular and Interventional Radiology Procedure Note  Patient: Molly Patel DOB: 1946/04/24 Medical Record Number: [id number] Note Date/Time: 10/10/21 11:32 AM   Performing Physician: Michaelle Birks, MD Assistant(s): None  Diagnosis: Pleural effusion  Procedure:  RIGHT THORACENTESIS, DIAGNOSTIC and THERAPEUTIC  Anesthesia: Local Anesthetic Complications: None Estimated Blood Loss:  0 mL Specimens:  Sent Cytology  Findings:  The Right chest was accessed with a 52F Pigtail catheter 1.5 L of SS pleural fluid was obtained.  See detailed procedure note with images in PACS. The patient tolerated the procedure well without incident or complication and was returned to Recovery in stable condition.    Michaelle Birks, MD [facility] Radiology   Pager. [place]

## 2021-10-11 LAB — CYTOLOGY - NON PAP

## 2021-10-14 ENCOUNTER — Other Ambulatory Visit: Payer: Self-pay

## 2021-10-14 ENCOUNTER — Ambulatory Visit (HOSPITAL_COMMUNITY): Payer: Medicare PPO | Attending: Cardiovascular Disease

## 2021-10-14 DIAGNOSIS — Z87891 Personal history of nicotine dependence: Secondary | ICD-10-CM | POA: Insufficient documentation

## 2021-10-14 DIAGNOSIS — E785 Hyperlipidemia, unspecified: Secondary | ICD-10-CM | POA: Insufficient documentation

## 2021-10-14 DIAGNOSIS — I340 Nonrheumatic mitral (valve) insufficiency: Secondary | ICD-10-CM | POA: Insufficient documentation

## 2021-10-14 DIAGNOSIS — Z0189 Encounter for other specified special examinations: Secondary | ICD-10-CM

## 2021-10-14 DIAGNOSIS — C8510 Unspecified B-cell lymphoma, unspecified site: Secondary | ICD-10-CM | POA: Insufficient documentation

## 2021-10-14 DIAGNOSIS — I3139 Other pericardial effusion (noninflammatory): Secondary | ICD-10-CM | POA: Insufficient documentation

## 2021-10-14 DIAGNOSIS — I119 Hypertensive heart disease without heart failure: Secondary | ICD-10-CM | POA: Diagnosis not present

## 2021-10-14 DIAGNOSIS — Z01818 Encounter for other preprocedural examination: Secondary | ICD-10-CM | POA: Insufficient documentation

## 2021-10-14 LAB — ECHOCARDIOGRAM COMPLETE
Area-P 1/2: 2.21 cm2
S' Lateral: 3.2 cm

## 2021-10-15 ENCOUNTER — Encounter: Payer: Self-pay | Admitting: General Surgery

## 2021-10-15 ENCOUNTER — Ambulatory Visit: Payer: Medicare PPO | Admitting: General Surgery

## 2021-10-15 VITALS — BP 132/80 | HR 56 | Temp 98.4°F | Resp 18 | Ht 66.5 in | Wt 164.0 lb

## 2021-10-15 DIAGNOSIS — C8510 Unspecified B-cell lymphoma, unspecified site: Secondary | ICD-10-CM | POA: Diagnosis not present

## 2021-10-15 NOTE — H&P (Signed)
Molly Patel; [id number]; 08/01/1946   HPI Patient is a 76 year old black female who was referred to my care by Dr. Delton Coombes of oncology for Port-A-Cath insertion.  She has B-cell lymphoma is about to undergo chemotherapy. Past Medical History:  Diagnosis Date   Angioedema    B-cell lymphoma ([place])    Depression    Dizziness    Echocardiogram with ECG monitoring 02/23/2011   mild LVH, normal event monitor    Hyperlipidemia    Hypertension    takes meds daily   Incontinence    Insomnia    Ovarian cyst    Seizures ([place])    2 weeks ago   Stroke ([place]) 08/25/2014   Syncope    Vitamin D deficiency     Past Surgical History:  Procedure Laterality Date   ABDOMINAL HYSTERECTOMY     COLONOSCOPY N/A 11/02/2014   Procedure: COLONOSCOPY;  Surgeon: Rogene Houston, MD;  [facility];  Service: Endoscopy;  Laterality: N/A;  140 - moved to 12:55 - Ann to notify pt   CRANIOTOMY  06/29/2012   Procedure: CRANIOTOMY TUMOR EXCISION;  Surgeon: Faythe Ghee, MD;  Location: [facility];  Service: Neurosurgery;  Laterality: Left;  Left frontal Craniotomy for Meningioma   ESOPHAGEAL DILATION N/A 11/02/2014   Procedure: ESOPHAGEAL DILATION;  Surgeon: Rogene Houston, MD;  [facility];  Service: Endoscopy;  Laterality: N/A;   ESOPHAGOGASTRODUODENOSCOPY N/A 11/02/2014   Procedure: ESOPHAGOGASTRODUODENOSCOPY (EGD);  Surgeon: Rogene Houston, MD;  [facility];  Service: Endoscopy;  Laterality: N/A;   VESICOVAGINAL FISTULA CLOSURE W/ TAH      Family History  Problem Relation Age of Onset   Stroke Father    Asthma Grandchild    Arthritis Sister    Diabetes Sister    Colon cancer Brother     Current Outpatient Medications on File Prior to Visit  Medication Sig Dispense Refill   aspirin EC 325 MG tablet Take 1 tablet (325 mg total) by mouth daily. 30 tablet 0   atenolol (TENORMIN) 50 MG tablet TAKE 1 TABLET EVERY DAY (Patient taking differently: Take 50 mg by  mouth daily.) 90 tablet 2   cholecalciferol (VITAMIN D) 25 MCG (1000 UNIT) tablet TAKE 1 TABLET EVERY DAY (Patient taking differently: 1,000 Units daily.) 90 tablet 3   donepezil (ARICEPT) 5 MG tablet TAKE 1 TABLET AT BEDTIME (Patient taking differently: Take 5 mg by mouth at bedtime.) 90 tablet 1   hydrochlorothiazide (MICROZIDE) 12.5 MG capsule TAKE 1 CAPSULE EVERY DAY (Patient taking differently: Take 12.5 mg by mouth daily.) 90 capsule 0   HYDROcodone-acetaminophen (NORCO/VICODIN) 5-325 MG tablet Take 1 tablet by mouth.     levETIRAcetam (KEPPRA) 500 MG tablet Take 1 tablet (500 mg total) by mouth 2 (two) times daily. Needs to establish with new PCP.  Dr. Buelah Manis is no longer at this office.  Must be seen before more refills. 60 tablet 0   memantine (NAMENDA) 5 MG tablet Take 5 mg by mouth daily.     rosuvastatin (CRESTOR) 20 MG tablet Take 20 mg by mouth once a week. Thursday     No current facility-administered medications on file prior to visit.    No Known Allergies  Social History   Substance and Sexual Activity  Alcohol Use No    Social History   Tobacco Use  Smoking Status Former   Years: 7.00   Types: Cigarettes   Quit date: 08/25/1985   Years since  quitting: 36.1  Smokeless Tobacco Never    Review of Systems  Constitutional:  Positive for malaise/fatigue.  HENT: Negative.    Eyes: Negative.   Respiratory:  Positive for shortness of breath.   Cardiovascular: Negative.   Gastrointestinal: Negative.   Genitourinary: Negative.   Musculoskeletal: Negative.   Skin: Negative.   Neurological: Negative.   Endo/Heme/Allergies: Negative.   Psychiatric/Behavioral: Negative.     Objective   Vitals:   10/15/21 1326  BP: 132/80  Pulse: (!) 56  Resp: 18  Temp: 98.4 F (36.9 C)  SpO2: 91%    Physical Exam Vitals reviewed.  Constitutional:      Appearance: Normal appearance. She is normal weight. She is not ill-appearing.  HENT:     Head: Normocephalic and  atraumatic.  Cardiovascular:     Rate and Rhythm: Normal rate and regular rhythm.     Heart sounds: Normal heart sounds. No murmur heard.   No friction rub. No gallop.  Pulmonary:     Effort: Pulmonary effort is normal. No respiratory distress.     Breath sounds: Normal breath sounds. No stridor. No wheezing, rhonchi or rales.  Skin:    General: Skin is warm and dry.  Neurological:     Mental Status: She is alert and oriented to person, place, and time.   Dr. Tomie China notes reviewed Assessment  B-cell lymphoma, need for central venous access Plan  Patient is scheduled for Port-A-Cath insertion on 10/18/2021.  The risks and benefits of the procedure including bleeding, infection, pneumothorax were fully explained to the patient, who gave informed consent.  Patient was instructed to take her beta-blocker and her Keppra the morning of surgery.

## 2021-10-15 NOTE — Progress Notes (Signed)
Molly Patel; [id number]; Nov 13, 1945   HPI Patient is a 76 year old black female who was referred to my care by Dr. Delton Coombes of oncology for Port-A-Cath insertion.  She has B-cell lymphoma is about to undergo chemotherapy. Past Medical History:  Diagnosis Date   Angioedema    B-cell lymphoma ([place])    Depression    Dizziness    Echocardiogram with ECG monitoring 02/23/2011   mild LVH, normal event monitor    Hyperlipidemia    Hypertension    takes meds daily   Incontinence    Insomnia    Ovarian cyst    Seizures ([place])    2 weeks ago   Stroke ([place]) 08/25/2014   Syncope    Vitamin D deficiency     Past Surgical History:  Procedure Laterality Date   ABDOMINAL HYSTERECTOMY     COLONOSCOPY N/A 11/02/2014   Procedure: COLONOSCOPY;  Surgeon: Rogene Houston, MD;  [facility];  Service: Endoscopy;  Laterality: N/A;  140 - moved to 12:55 - Ann to notify pt   CRANIOTOMY  06/29/2012   Procedure: CRANIOTOMY TUMOR EXCISION;  Surgeon: Faythe Ghee, MD;  Location: [facility];  Service: Neurosurgery;  Laterality: Left;  Left frontal Craniotomy for Meningioma   ESOPHAGEAL DILATION N/A 11/02/2014   Procedure: ESOPHAGEAL DILATION;  Surgeon: Rogene Houston, MD;  [facility];  Service: Endoscopy;  Laterality: N/A;   ESOPHAGOGASTRODUODENOSCOPY N/A 11/02/2014   Procedure: ESOPHAGOGASTRODUODENOSCOPY (EGD);  Surgeon: Rogene Houston, MD;  [facility];  Service: Endoscopy;  Laterality: N/A;   VESICOVAGINAL FISTULA CLOSURE W/ TAH      Family History  Problem Relation Age of Onset   Stroke Father    Asthma Grandchild    Arthritis Sister    Diabetes Sister    Colon cancer Brother     Current Outpatient Medications on File Prior to Visit  Medication Sig Dispense Refill   aspirin EC 325 MG tablet Take 1 tablet (325 mg total) by mouth daily. 30 tablet 0   atenolol (TENORMIN) 50 MG tablet TAKE 1 TABLET EVERY DAY (Patient taking differently: Take 50 mg by  mouth daily.) 90 tablet 2   cholecalciferol (VITAMIN D) 25 MCG (1000 UNIT) tablet TAKE 1 TABLET EVERY DAY (Patient taking differently: 1,000 Units daily.) 90 tablet 3   donepezil (ARICEPT) 5 MG tablet TAKE 1 TABLET AT BEDTIME (Patient taking differently: Take 5 mg by mouth at bedtime.) 90 tablet 1   hydrochlorothiazide (MICROZIDE) 12.5 MG capsule TAKE 1 CAPSULE EVERY DAY (Patient taking differently: Take 12.5 mg by mouth daily.) 90 capsule 0   HYDROcodone-acetaminophen (NORCO/VICODIN) 5-325 MG tablet Take 1 tablet by mouth.     levETIRAcetam (KEPPRA) 500 MG tablet Take 1 tablet (500 mg total) by mouth 2 (two) times daily. Needs to establish with new PCP.  Dr. Buelah Manis is no longer at this office.  Must be seen before more refills. 60 tablet 0   memantine (NAMENDA) 5 MG tablet Take 5 mg by mouth daily.     rosuvastatin (CRESTOR) 20 MG tablet Take 20 mg by mouth once a week. Thursday     No current facility-administered medications on file prior to visit.    No Known Allergies  Social History   Substance and Sexual Activity  Alcohol Use No    Social History   Tobacco Use  Smoking Status Former   Years: 7.00   Types: Cigarettes   Quit date: 08/25/1985   Years since  quitting: 36.1  Smokeless Tobacco Never    Review of Systems  Constitutional:  Positive for malaise/fatigue.  HENT: Negative.    Eyes: Negative.   Respiratory:  Positive for shortness of breath.   Cardiovascular: Negative.   Gastrointestinal: Negative.   Genitourinary: Negative.   Musculoskeletal: Negative.   Skin: Negative.   Neurological: Negative.   Endo/Heme/Allergies: Negative.   Psychiatric/Behavioral: Negative.     Objective   Vitals:   10/15/21 1326  BP: 132/80  Pulse: (!) 56  Resp: 18  Temp: 98.4 F (36.9 C)  SpO2: 91%    Physical Exam Vitals reviewed.  Constitutional:      Appearance: Normal appearance. She is normal weight. She is not ill-appearing.  HENT:     Head: Normocephalic and  atraumatic.  Cardiovascular:     Rate and Rhythm: Normal rate and regular rhythm.     Heart sounds: Normal heart sounds. No murmur heard.   No friction rub. No gallop.  Pulmonary:     Effort: Pulmonary effort is normal. No respiratory distress.     Breath sounds: Normal breath sounds. No stridor. No wheezing, rhonchi or rales.  Skin:    General: Skin is warm and dry.  Neurological:     Mental Status: She is alert and oriented to person, place, and time.   Dr. Tomie China notes reviewed Assessment  B-cell lymphoma, need for central venous access Plan  Patient is scheduled for Port-A-Cath insertion on 10/18/2021.  The risks and benefits of the procedure including bleeding, infection, pneumothorax were fully explained to the patient, who gave informed consent.  Patient was instructed to take her beta-blocker and her Keppra the morning of surgery.

## 2021-10-15 NOTE — Patient Instructions (Signed)
Molly Patel  10/15/2021     @PREFPERIOPPHARMACY @   Your procedure is scheduled on  10/18/2021.   Report to [facility] at  1025 A.M.   Call this number if you have problems the morning of surgery:  [phone]   Remember:  Do not eat or drink after midnight.      Take these medicines the morning of surgery with A SIP OF WATER         atenolol, hydrocodone(if needed), keppra, namenda.     Do not wear jewelry, make-up or nail polish.  Do not wear lotions, powders, or perfumes, or deodorant.  Do not shave 48 hours prior to surgery.  Men may shave face and neck.  Do not bring valuables to the hospital.  [facility] is not responsible for any belongings or valuables.  Contacts, dentures or bridgework may not be worn into surgery.  Leave your suitcase in the car.  After surgery it may be brought to your room.  For patients admitted to the hospital, discharge time will be determined by your treatment team.  Patients discharged the day of surgery will not be allowed to drive home and must have someone with them for 24 hours.    Special instructions:   DO NOT smoke tobacco or vape fore 24 hours before your procedure.  Please read over the following fact sheets that you were given. Anesthesia Post-op Instructions and Care and Recovery After Surgery      Implanted [facility] Guide An implanted port is a device that is placed under the skin. It is usually placed in the chest. The device may vary based on the need. Implanted ports can be used to give IV medicine, to take blood, or to give fluids. You may have an implanted port if: You need IV medicine that would be irritating to the small veins in your hands or arms. You need IV medicines, such as chemotherapy, for a long period of time. You need IV nutrition for a long period of time. You may have fewer limitations when using a port than you would if you used other types of long-term IVs. You will also likely be  able to return to normal activities after your incision heals. An implanted port has two main parts: Reservoir. The reservoir is the part where a needle is inserted to give medicines or draw blood. The reservoir is round. After the port is placed, it appears as a small, raised area under your skin. Catheter. The catheter is a small, thin tube that connects the reservoir to a vein. Medicine that is inserted into the reservoir goes into the catheter and then into the vein. How is my port accessed? To access your port: A numbing cream may be placed on the skin over the port site. Your health care provider will put on a mask and sterile gloves. The skin over your port will be cleaned carefully with a germ-killing soap and allowed to dry. Your health care provider will gently pinch the port and insert a needle into it. Your health care provider will check for a blood return to make sure the port is in the vein and is still working (patent). If your port needs to remain accessed to get medicine continuously (constant infusion), your health care provider will place a clear bandage (dressing) over the needle site. The dressing and needle will need to be changed every week, or as told by your  health care provider. What is flushing? Flushing helps keep the port working. Follow instructions from your health care provider about how and when to flush the port. Ports are usually flushed with saline solution or a medicine called heparin. The need for flushing will depend on how the port is used: If the port is only used from time to time to give medicines or draw blood, the port may need to be flushed: Before and after medicines have been given. Before and after blood has been drawn. As part of routine maintenance. Flushing may be recommended every 4-6 weeks. If a constant infusion is running, the port may not need to be flushed. Throw away any syringes in a disposal container that is meant for sharp items  (sharps container). You can buy a sharps container from a pharmacy, or you can make one by using an empty hard plastic bottle with a cover. How long will my port stay implanted? The port can stay in for as long as your health care provider thinks it is needed. When it is time for the port to come out, a surgery will be done to remove it. The surgery will be similar to the procedure that was done to put the port in. Follow these instructions at home: Caring for your port and port site Flush your port as told by your health care provider. If you need an infusion over several days, follow instructions from your health care provider about how to take care of your port site. Make sure you: Change your dressing as told by your health care provider. Wash your hands with soap and water for at least 20 seconds before and after you change your dressing. If soap and water are not available, use alcohol-based hand sanitizer. Place any used dressings or infusion bags into a plastic bag. Throw that bag in the trash. Keep the dressing that covers the needle clean and dry. Do not get it wet. Do not use scissors or sharp objects near the infusion tubing. Keep any external tubes clamped, unless they are being used. Check your port site every day for signs of infection. Check for: Redness, swelling, or pain. Fluid or blood. Warmth. Pus or a bad smell. Protect the skin around the port site. Avoid wearing bra straps that rub or irritate the site. Protect the skin around your port from seat belts. Place a soft pad over your chest if needed. Bathe or shower as told by your health care provider. The site may get wet as long as you are not actively receiving an infusion. General instructions  Return to your normal activities as told by your health care provider. Ask your health care provider what activities are safe for you. Carry a medical alert card or wear a medical alert bracelet at all times. This will let  health care providers know that you have an implanted port in case of an emergency. Where to find more information American Cancer Society: www.cancer.[place] [employer] On[employer]: www.cancer.net Contact a health care provider if: You have a fever or chills. You have redness, swelling, or pain at the port site. You have fluid or blood coming from your port site. Your incision feels warm to the touch. You have pus or a bad smell coming from the port site. Summary Implanted ports are usually placed in the chest for long-term IV access. Follow instructions from your health care provider about flushing the port and changing bandages (dressings). Take care of the area around your  port by avoiding clothing that puts pressure on the area, and by watching for signs of infection. Protect the skin around your port from seat belts. Place a soft pad over your chest if needed. Contact a health care provider if you have a fever or you have redness, swelling, pain, fluid, or a bad smell at the port site. This information is not intended to replace advice given to you by your health care provider. Make sure you discuss any questions you have with your health care provider. Document Revised: 02/12/2021 Document Reviewed: 02/12/2021 Elsevier Patient Education  2022 [place] Insertion, Care After The following information offers guidance on how to care for yourself after your procedure. Your health care provider may also give you more specific instructions. If you have problems or questions, contact your health care provider. What can I expect after the procedure? After the procedure, it is common to have: Discomfort at the port insertion site. Bruising on the skin over the port. This should improve over 3-4 days. Follow these instructions at home: [facility] care After your port is placed, you will get a manufacturer's information card. The card has information about your port.  Keep this card with you at all times. Take care of the port as told by your health care provider. Ask your health care provider if you or a family member can get training for taking care of the port at home. A home health care nurse will be be available to help care for the port. Make sure to remember what type of port you have. Incision care   Follow instructions from your health care provider about how to take care of your port insertion site. Make sure you: Wash your hands with soap and water for at least 20 seconds before and after you change your bandage (dressing). If soap and water are not available, use hand sanitizer. Change your dressing as told by your health care provider. Leave stitches (sutures), skin glue, or adhesive strips in place. These skin closures may need to stay in place for 2 weeks or longer. If adhesive strip edges start to loosen and curl up, you may trim the loose edges. Do not remove adhesive strips completely unless your health care provider tells you to do that. Check your port insertion site every day for signs of infection. Check for: Redness, swelling, or pain. Fluid or blood. Warmth. Pus or a bad smell. Activity Return to your normal activities as told by your health care provider. Ask your health care provider what activities are safe for you. You may have to avoid lifting. Ask your health care provider how much you can safely lift. General instructions Take over-the-counter and prescription medicines only as told by your health care provider. Do not take baths, swim, or use a hot tub until your health care provider approves. Ask your health care provider if you may take showers. You may only be allowed to take sponge baths. If you were given a sedative during the procedure, it can affect you for several hours. Do not drive or operate machinery until your health care provider says that it is safe. Wear a medical alert bracelet in case of an emergency. This  will tell any health care providers that you have a port. Keep all follow-up visits. This is important. Contact a health care provider if: You cannot flush your port with saline as directed, or you cannot draw blood from the port. You have a fever or chills. You  have redness, swelling, or pain around your port insertion site. You have fluid or blood coming from your port insertion site. Your port insertion site feels warm to the touch. You have pus or a bad smell coming from the port insertion site. Get help right away if: You have chest pain or shortness of breath. You have bleeding from your port that you cannot control. These symptoms may be an emergency. Get help right away. Call 911. Do not wait to see if the symptoms will go away. Do not drive yourself to the hospital. Summary Take care of the port as told by your health care provider. Keep the manufacturer's information card with you at all times. Change your dressing as told by your health care provider. Contact a health care provider if you have a fever or chills or if you have redness, swelling, or pain around your port insertion site. Keep all follow-up visits. This information is not intended to replace advice given to you by your health care provider. Make sure you discuss any questions you have with your health care provider. Document Revised: 02/12/2021 Document Reviewed: 02/12/2021 Elsevier Patient Education  [place] After This sheet gives you information about how to care for yourself after your procedure. Your health care provider may also give you more specific instructions. If you have problems or questions, contact your health care provider. What can I expect after the procedure? After the procedure, it is common to have: Tiredness. Forgetfulness about what happened after the procedure. Impaired judgment for important decisions. Nausea or vomiting. Some difficulty with  balance. Follow these instructions at home: For the time period you were told by your health care provider:   Rest as needed. Do not participate in activities where you could fall or become injured. Do not drive or use machinery. Do not drink alcohol. Do not take sleeping pills or medicines that cause drowsiness. Do not make important decisions or sign legal documents. Do not take care of children on your own. Eating and drinking Follow the diet that is recommended by your health care provider. Drink enough fluid to keep your urine pale yellow. If you vomit: Drink water, juice, or soup when you can drink without vomiting. Make sure you have little or no nausea before eating solid foods. General instructions Have a responsible adult stay with you for the time you are told. It is important to have someone help care for you until you are awake and alert. Take over-the-counter and prescription medicines only as told by your health care provider. If you have sleep apnea, surgery and certain medicines can increase your risk for breathing problems. Follow instructions from your health care provider about wearing your sleep device: Anytime you are sleeping, including during daytime naps. While taking prescription pain medicines, sleeping medicines, or medicines that make you drowsy. Avoid smoking. Keep all follow-up visits as told by your health care provider. This is important. Contact a health care provider if: You keep feeling nauseous or you keep vomiting. You feel light-headed. You are still sleepy or having trouble with balance after 24 hours. You develop a rash. You have a fever. You have redness or swelling around the IV site. Get help right away if: You have trouble breathing. You have new-onset confusion at home. Summary For several hours after your procedure, you may feel tired. You may also be forgetful and have poor judgment. Have a responsible adult stay with you for the  time you  are told. It is important to have someone help care for you until you are awake and alert. Rest as told. Do not drive or operate machinery. Do not drink alcohol or take sleeping pills. Get help right away if you have trouble breathing, or if you suddenly become confused. This information is not intended to replace advice given to you by your health care provider. Make sure you discuss any questions you have with your health care provider. Document Revised: 04/26/2020 Document Reviewed: 07/14/2019 Elsevier Patient Education  2022 [place]. How to Use Chlorhexidine for Bathing Chlorhexidine gluconate (CHG) is a germ-killing (antiseptic) solution that is used to clean the skin. It can get rid of the bacteria that normally live on the skin and can keep them away for about 24 hours. To clean your skin with CHG, you may be given: A CHG solution to use in the shower or as part of a sponge bath. A prepackaged cloth that contains CHG. Cleaning your skin with CHG may help lower the risk for infection: While you are staying in the intensive care unit of the hospital. If you have a vascular access, such as a central line, to provide short-term or long-term access to your veins. If you have a catheter to drain urine from your bladder. If you are on a ventilator. A ventilator is a machine that helps you breathe by moving air in and out of your lungs. After surgery. What are the risks? Risks of using CHG include: A skin reaction. Hearing loss, if CHG gets in your ears and you have a perforated eardrum. Eye injury, if CHG gets in your eyes and is not rinsed out. The CHG product catching fire. Make sure that you avoid smoking and flames after applying CHG to your skin. Do not use CHG: If you have a chlorhexidine allergy or have previously reacted to chlorhexidine. On babies younger than 16 months of age. How to use CHG solution Use CHG only as told by your health care provider, and follow the  instructions on the label. Use the full amount of CHG as directed. Usually, this is one bottle. During a shower Follow these steps when using CHG solution during a shower (unless your health care provider gives you different instructions): Start the shower. Use your normal soap and shampoo to wash your face and hair. Turn off the shower or move out of the shower stream. Pour the CHG onto a clean washcloth. Do not use any type of brush or rough-edged sponge. Starting at your neck, lather your body down to your toes. Make sure you follow these instructions: If you will be having surgery, pay special attention to the part of your body where you will be having surgery. Scrub this area for at least 1 minute. Do not use CHG on your head or face. If the solution gets into your ears or eyes, rinse them well with water. Avoid your genital area. Avoid any areas of skin that have broken skin, cuts, or scrapes. Scrub your back and under your arms. Make sure to wash skin folds. Let the lather sit on your skin for 1-2 minutes or as long as told by your health care provider. Thoroughly rinse your entire body in the shower. Make sure that all body creases and crevices are rinsed well. Dry off with a clean towel. Do not put any substances on your body afterward--such as powder, lotion, or perfume--unless you are told to do so by your health care provider. Only use  lotions that are recommended by the manufacturer. Put on clean clothes or pajamas. If it is the night before your surgery, sleep in clean sheets.  During a sponge bath Follow these steps when using CHG solution during a sponge bath (unless your health care provider gives you different instructions): Use your normal soap and shampoo to wash your face and hair. Pour the CHG onto a clean washcloth. Starting at your neck, lather your body down to your toes. Make sure you follow these instructions: If you will be having surgery, pay special attention to  the part of your body where you will be having surgery. Scrub this area for at least 1 minute. Do not use CHG on your head or face. If the solution gets into your ears or eyes, rinse them well with water. Avoid your genital area. Avoid any areas of skin that have broken skin, cuts, or scrapes. Scrub your back and under your arms. Make sure to wash skin folds. Let the lather sit on your skin for 1-2 minutes or as long as told by your health care provider. Using a different clean, wet washcloth, thoroughly rinse your entire body. Make sure that all body creases and crevices are rinsed well. Dry off with a clean towel. Do not put any substances on your body afterward--such as powder, lotion, or perfume--unless you are told to do so by your health care provider. Only use lotions that are recommended by the manufacturer. Put on clean clothes or pajamas. If it is the night before your surgery, sleep in clean sheets. How to use CHG prepackaged cloths Only use CHG cloths as told by your health care provider, and follow the instructions on the label. Use the CHG cloth on clean, dry skin. Do not use the CHG cloth on your head or face unless your health care provider tells you to. When washing with the CHG cloth: Avoid your genital area. Avoid any areas of skin that have broken skin, cuts, or scrapes. Before surgery Follow these steps when using a CHG cloth to clean before surgery (unless your health care provider gives you different instructions): Using the CHG cloth, vigorously scrub the part of your body where you will be having surgery. Scrub using a back-and-forth motion for 3 minutes. The area on your body should be completely wet with CHG when you are done scrubbing. Do not rinse. Discard the cloth and let the area air-dry. Do not put any substances on the area afterward, such as powder, lotion, or perfume. Put on clean clothes or pajamas. If it is the night before your surgery, sleep in clean  sheets.  For general bathing Follow these steps when using CHG cloths for general bathing (unless your health care provider gives you different instructions). Use a separate CHG cloth for each area of your body. Make sure you wash between any folds of skin and between your fingers and toes. Wash your body in the following order, switching to a new cloth after each step: The front of your neck, shoulders, and chest. Both of your arms, under your arms, and your hands. Your stomach and groin area, avoiding the genitals. Your right leg and foot. Your left leg and foot. The back of your neck, your back, and your buttocks. Do not rinse. Discard the cloth and let the area air-dry. Do not put any substances on your body afterward--such as powder, lotion, or perfume--unless you are told to do so by your health care provider. Only use lotions that  are recommended by the manufacturer. Put on clean clothes or pajamas. Contact a health care provider if: Your skin gets irritated after scrubbing. You have questions about using your solution or cloth. You swallow any chlorhexidine. Call your local poison control center (1-(330)445-0309 in the [place] Get help right away if: Your eyes itch badly, or they become very red or swollen. Your skin itches badly and is red or swollen. Your hearing changes. You have trouble seeing. You have swelling or tingling in your mouth or throat. You have trouble breathing. These symptoms may represent a serious problem that is an emergency. Do not wait to see if the symptoms will go away. Get medical help right away. Call your local emergency services (911 in the [place] Do not drive yourself to the hospital. Summary Chlorhexidine gluconate (CHG) is a germ-killing (antiseptic) solution that is used to clean the skin. Cleaning your skin with CHG may help to lower your risk for infection. You may be given CHG to use for bathing. It may be in a bottle or in a prepackaged cloth to  use on your skin. Carefully follow your health care provider's instructions and the instructions on the product label. Do not use CHG if you have a chlorhexidine allergy. Contact your health care provider if your skin gets irritated after scrubbing. This information is not intended to replace advice given to you by your health care provider. Make sure you discuss any questions you have with your health care provider. Document Revised: 10/22/2020 Document Reviewed: 10/22/2020 Elsevier Patient Education  2022 [employer].

## 2021-10-16 ENCOUNTER — Encounter (HOSPITAL_COMMUNITY)
Admission: RE | Admit: 2021-10-16 | Discharge: 2021-10-16 | Disposition: A | Discharge status: Home / Self Care | Payer: Medicare PPO | Source: Ambulatory Visit | Attending: General Surgery | Admitting: General Surgery

## 2021-10-16 ENCOUNTER — Ambulatory Visit (HOSPITAL_COMMUNITY)
Admission: RE | Admit: 2021-10-16 | Discharge: 2021-10-16 | Disposition: A | Discharge status: Home / Self Care | Payer: Medicare PPO | Source: Ambulatory Visit | Attending: Hematology | Admitting: Hematology

## 2021-10-16 ENCOUNTER — Other Ambulatory Visit: Payer: Self-pay

## 2021-10-16 DIAGNOSIS — G9389 Other specified disorders of brain: Secondary | ICD-10-CM | POA: Diagnosis not present

## 2021-10-16 DIAGNOSIS — C8510 Unspecified B-cell lymphoma, unspecified site: Secondary | ICD-10-CM | POA: Insufficient documentation

## 2021-10-16 MED ORDER — GADOBUTROL 1 MMOL/ML IV SOLN
7.5000 mL | Freq: Once | INTRAVENOUS | Status: AC | PRN
  Administered 2021-10-16: 7.5 mL via INTRAVENOUS

## 2021-10-17 ENCOUNTER — Other Ambulatory Visit: Payer: Self-pay

## 2021-10-17 ENCOUNTER — Ambulatory Visit (HOSPITAL_COMMUNITY)
Admission: RE | Admit: 2021-10-17 | Discharge: 2021-10-17 | Disposition: A | Discharge status: Home / Self Care | Payer: Medicare PPO | Source: Ambulatory Visit | Attending: Hematology | Admitting: Hematology

## 2021-10-17 ENCOUNTER — Encounter (HOSPITAL_COMMUNITY): Payer: Self-pay

## 2021-10-17 DIAGNOSIS — C8594 Non-Hodgkin lymphoma, unspecified, lymph nodes of axilla and upper limb: Secondary | ICD-10-CM | POA: Insufficient documentation

## 2021-10-17 DIAGNOSIS — C8510 Unspecified B-cell lymphoma, unspecified site: Secondary | ICD-10-CM | POA: Insufficient documentation

## 2021-10-17 DIAGNOSIS — C8595 Non-Hodgkin lymphoma, unspecified, lymph nodes of inguinal region and lower limb: Secondary | ICD-10-CM | POA: Insufficient documentation

## 2021-10-17 DIAGNOSIS — C8582 Other specified types of non-Hodgkin lymphoma, intrathoracic lymph nodes: Secondary | ICD-10-CM | POA: Insufficient documentation

## 2021-10-17 DIAGNOSIS — C8593 Non-Hodgkin lymphoma, unspecified, intra-abdominal lymph nodes: Secondary | ICD-10-CM | POA: Insufficient documentation

## 2021-10-17 DIAGNOSIS — J90 Pleural effusion, not elsewhere classified: Secondary | ICD-10-CM | POA: Insufficient documentation

## 2021-10-17 DIAGNOSIS — C8517 Unspecified B-cell lymphoma, spleen: Secondary | ICD-10-CM | POA: Diagnosis not present

## 2021-10-17 MED ORDER — FLUDEOXYGLUCOSE F - 18 (FDG) INJECTION
8.7190 | Freq: Once | INTRAVENOUS | Status: AC | PRN
  Administered 2021-10-17: 8.719 via INTRAVENOUS

## 2021-10-18 ENCOUNTER — Ambulatory Visit (HOSPITAL_COMMUNITY): Payer: Medicare PPO

## 2021-10-18 ENCOUNTER — Ambulatory Visit (HOSPITAL_COMMUNITY): Payer: Medicare PPO | Admitting: Anesthesiology

## 2021-10-18 ENCOUNTER — Other Ambulatory Visit: Payer: Self-pay

## 2021-10-18 ENCOUNTER — Encounter (HOSPITAL_COMMUNITY): Payer: Self-pay | Admitting: General Surgery

## 2021-10-18 ENCOUNTER — Ambulatory Visit (HOSPITAL_BASED_OUTPATIENT_CLINIC_OR_DEPARTMENT_OTHER): Payer: Medicare PPO | Admitting: Anesthesiology

## 2021-10-18 ENCOUNTER — Ambulatory Visit: Payer: Medicare PPO | Admitting: Nurse Practitioner

## 2021-10-18 ENCOUNTER — Encounter (HOSPITAL_COMMUNITY)
Admission: RE | Disposition: A | Discharge status: Home / Self Care | Payer: Self-pay | Source: Home / Self Care | Attending: General Surgery

## 2021-10-18 ENCOUNTER — Ambulatory Visit (HOSPITAL_COMMUNITY)
Admission: RE | Admit: 2021-10-18 | Discharge: 2021-10-18 | Disposition: A | Discharge status: Home / Self Care | Payer: Medicare PPO | Attending: General Surgery | Admitting: General Surgery

## 2021-10-18 DIAGNOSIS — Z87891 Personal history of nicotine dependence: Secondary | ICD-10-CM | POA: Diagnosis not present

## 2021-10-18 DIAGNOSIS — Z8673 Personal history of transient ischemic attack (TIA), and cerebral infarction without residual deficits: Secondary | ICD-10-CM | POA: Diagnosis not present

## 2021-10-18 DIAGNOSIS — G40909 Epilepsy, unspecified, not intractable, without status epilepticus: Secondary | ICD-10-CM | POA: Diagnosis not present

## 2021-10-18 DIAGNOSIS — C8590 Non-Hodgkin lymphoma, unspecified, unspecified site: Secondary | ICD-10-CM | POA: Diagnosis not present

## 2021-10-18 DIAGNOSIS — C8330 Diffuse large B-cell lymphoma, unspecified site: Secondary | ICD-10-CM | POA: Diagnosis not present

## 2021-10-18 DIAGNOSIS — Z95828 Presence of other vascular implants and grafts: Secondary | ICD-10-CM

## 2021-10-18 DIAGNOSIS — C8510 Unspecified B-cell lymphoma, unspecified site: Secondary | ICD-10-CM | POA: Diagnosis not present

## 2021-10-18 DIAGNOSIS — F32A Depression, unspecified: Secondary | ICD-10-CM | POA: Insufficient documentation

## 2021-10-18 DIAGNOSIS — I10 Essential (primary) hypertension: Secondary | ICD-10-CM

## 2021-10-18 DIAGNOSIS — J9811 Atelectasis: Secondary | ICD-10-CM | POA: Diagnosis not present

## 2021-10-18 DIAGNOSIS — J90 Pleural effusion, not elsewhere classified: Secondary | ICD-10-CM | POA: Diagnosis not present

## 2021-10-18 HISTORY — PX: PORTACATH PLACEMENT: SHX2246

## 2021-10-18 SURGERY — INSERTION, TUNNELED CENTRAL VENOUS DEVICE, WITH PORT
Anesthesia: General | Site: Chest | Laterality: Left

## 2021-10-18 MED ORDER — HEPARIN SOD (PORK) LOCK FLUSH 100 UNIT/ML IV SOLN
INTRAVENOUS | Status: AC
  Filled 2021-10-18: qty 5

## 2021-10-18 MED ORDER — CEFAZOLIN SODIUM-DEXTROSE 2-4 GM/100ML-% IV SOLN
INTRAVENOUS | Status: AC
  Filled 2021-10-18: qty 100

## 2021-10-18 MED ORDER — PROPOFOL 10 MG/ML IV BOLUS
INTRAVENOUS | Status: DC | PRN
  Administered 2021-10-18: 50 mg via INTRAVENOUS

## 2021-10-18 MED ORDER — SODIUM CHLORIDE (PF) 0.9 % IJ SOLN
INTRAMUSCULAR | Status: AC
  Filled 2021-10-18: qty 10

## 2021-10-18 MED ORDER — CEFAZOLIN SODIUM-DEXTROSE 2-4 GM/100ML-% IV SOLN: 2.0000 g | INTRAVENOUS | Status: DC

## 2021-10-18 MED ORDER — PHENYLEPHRINE HCL (PRESSORS) 10 MG/ML IV SOLN
INTRAVENOUS | Status: DC | PRN
  Administered 2021-10-18 (×2): 100 ug via INTRAVENOUS
  Administered 2021-10-18: 200 ug via INTRAVENOUS
  Administered 2021-10-18 (×2): 100 ug via INTRAVENOUS

## 2021-10-18 MED ORDER — SODIUM CHLORIDE (PF) 0.9 % IJ SOLN
INTRAMUSCULAR | Status: DC | PRN
  Administered 2021-10-18: 500 mL

## 2021-10-18 MED ORDER — KETOROLAC TROMETHAMINE 30 MG/ML IJ SOLN
15.0000 mg | Freq: Once | INTRAMUSCULAR | Status: AC
  Administered 2021-10-18: 15 mg via INTRAVENOUS

## 2021-10-18 MED ORDER — HEPARIN SOD (PORK) LOCK FLUSH 100 UNIT/ML IV SOLN
INTRAVENOUS | Status: DC | PRN
  Administered 2021-10-18: 500 [IU] via INTRAVENOUS

## 2021-10-18 MED ORDER — PROPOFOL 10 MG/ML IV BOLUS
INTRAVENOUS | Status: AC
  Filled 2021-10-18: qty 20

## 2021-10-18 MED ORDER — FENTANYL CITRATE (PF) 100 MCG/2ML IJ SOLN
INTRAMUSCULAR | Status: AC
  Filled 2021-10-18: qty 2

## 2021-10-18 MED ORDER — LIDOCAINE HCL (CARDIAC) PF 100 MG/5ML IV SOSY
PREFILLED_SYRINGE | INTRAVENOUS | Status: DC | PRN
  Administered 2021-10-18: 50 mg via INTRAVENOUS

## 2021-10-18 MED ORDER — CHLORHEXIDINE GLUCONATE CLOTH 2 % EX PADS: 6.0000 | MEDICATED_PAD | Freq: Once | CUTANEOUS | Status: DC

## 2021-10-18 MED ORDER — ONDANSETRON HCL 4 MG/2ML IJ SOLN: 4.0000 mg | Freq: Once | INTRAMUSCULAR | Status: DC | PRN

## 2021-10-18 MED ORDER — CHLORHEXIDINE GLUCONATE 0.12 % MT SOLN
15.0000 mL | Freq: Once | OROMUCOSAL | Status: AC
  Administered 2021-10-18: 15 mL via OROMUCOSAL

## 2021-10-18 MED ORDER — PROPOFOL 500 MG/50ML IV EMUL
INTRAVENOUS | Status: DC | PRN
  Administered 2021-10-18: 100 ug/kg/min via INTRAVENOUS

## 2021-10-18 MED ORDER — HYDROCODONE-ACETAMINOPHEN 5-325 MG PO TABS: 1.0000 | ORAL_TABLET | Freq: Four times a day (QID) | ORAL | 0 refills | Status: DC | PRN

## 2021-10-18 MED ORDER — LACTATED RINGERS IV SOLN: INTRAVENOUS | Status: DC

## 2021-10-18 MED ORDER — ORAL CARE MOUTH RINSE: 15.0000 mL | Freq: Once | OROMUCOSAL | Status: AC

## 2021-10-18 MED ORDER — LIDOCAINE HCL (PF) 1 % IJ SOLN
INTRAMUSCULAR | Status: AC
  Filled 2021-10-18: qty 30

## 2021-10-18 MED ORDER — LIDOCAINE HCL (PF) 1 % IJ SOLN
INTRAMUSCULAR | Status: DC | PRN
  Administered 2021-10-18: 8 mL

## 2021-10-18 MED ORDER — FENTANYL CITRATE PF 50 MCG/ML IJ SOSY: 25.0000 ug | PREFILLED_SYRINGE | INTRAMUSCULAR | Status: DC | PRN

## 2021-10-18 SURGICAL SUPPLY — 29 items
ADH SKN CLS APL DERMABOND .7 (GAUZE/BANDAGES/DRESSINGS) ×1
APL PRP STRL LF ISPRP CHG 10.5 (MISCELLANEOUS) ×1
APPLICATOR CHLORAPREP 10.5 ORG (MISCELLANEOUS) ×2 IMPLANT
BAG DECANTER FOR FLEXI CONT (MISCELLANEOUS) ×2 IMPLANT
CLOTH BEACON ORANGE TIMEOUT ST (SAFETY) ×2 IMPLANT
COVER LIGHT HANDLE STERIS (MISCELLANEOUS) ×4 IMPLANT
DECANTER SPIKE VIAL GLASS SM (MISCELLANEOUS) ×2 IMPLANT
DERMABOND ADVANCED (GAUZE/BANDAGES/DRESSINGS) ×1
DERMABOND ADVANCED .7 DNX12 (GAUZE/BANDAGES/DRESSINGS) ×1 IMPLANT
DRAPE C-ARM FOLDED MOBILE STRL (DRAPES) ×2 IMPLANT
ELECT REM PT RETURN 9FT ADLT (ELECTROSURGICAL) ×2
ELECTRODE REM PT RTRN 9FT ADLT (ELECTROSURGICAL) ×1 IMPLANT
GLOVE SURG POLYISO LF SZ7.5 (GLOVE) ×2 IMPLANT
GLOVE SURG UNDER POLY LF SZ7 (GLOVE) ×4 IMPLANT
GOWN STRL REUS W/TWL LRG LVL3 (GOWN DISPOSABLE) ×4 IMPLANT
IV NS 500ML (IV SOLUTION) ×2
IV NS 500ML BAXH (IV SOLUTION) ×1 IMPLANT
KIT PORT POWER 8FR ISP MRI (Port) ×2 IMPLANT
KIT TURNOVER KIT A (KITS) ×2 IMPLANT
NDL HYPO 25X1 1.5 SAFETY (NEEDLE) ×1 IMPLANT
NEEDLE HYPO 25X1 1.5 SAFETY (NEEDLE) ×2 IMPLANT
PACK MINOR (CUSTOM PROCEDURE TRAY) ×2 IMPLANT
PAD ARMBOARD 7.5X6 YLW CONV (MISCELLANEOUS) ×2 IMPLANT
SET BASIN LINEN APH (SET/KITS/TRAYS/PACK) ×2 IMPLANT
SUT MNCRL AB 4-0 PS2 18 (SUTURE) ×2 IMPLANT
SUT VIC AB 3-0 SH 27 (SUTURE) ×2
SUT VIC AB 3-0 SH 27X BRD (SUTURE) ×1 IMPLANT
SYR 5ML LL (SYRINGE) ×2 IMPLANT
SYR CONTROL 10ML LL (SYRINGE) ×2 IMPLANT

## 2021-10-18 NOTE — Op Note (Signed)
Patient:  Molly Patel  DOB:  [birth year]  MRN:  [id number]   Preop Diagnosis: B-cell lymphoma, need for central venous access  Postop Diagnosis: Same  Procedure: Port-A-Cath insertion  Surgeon: Aviva Signs, MD  Anes: MAC  Indications: Patient is a 76 year old black female who presents for Port-A-Cath as she needs central venous access for treatment of lymphoma.  The risks and benefits of the procedure including bleeding, infection, pneumothorax were fully explained to the patient, who gave informed consent.  Procedure note: The patient was placed in the supine position.  After monitored anesthesia was given, the left upper chest was prepped and draped using the usual sterile technique with ChloraPrep.  Surgical site confirmation was performed.  1% Xylocaine was used for local anesthesia.  An incision was made below the left clavicle.  A subcutaneous pocket was formed.  A needle was advanced into the left subclavian vein using the Seldinger technique without difficulty.  The guidewire was then advanced into the right atrium under fluoroscopic guidance.  It was noted that the wire was somewhat displaced due to the patient having a large right pleural effusion.  An introducer and peel-away sheath was placed over the guidewire.  The catheter was inserted through the peel-away sheath and the peel-away sheath was removed.  The catheter was then attached to the port and the port placed in subcutaneous pocket.  Adequate positioning was confirmed by fluoroscopy.  Good backflow of venous blood was noted on aspiration of the port.  The port was flushed with heparin flush.  The subcutaneous layer was reapproximated using a 3-0 Vicryl interrupted suture.  The skin was closed using a 4-0 Monocryl subcuticular suture.  Dermabond was applied.  All tape and needle counts were correct at the end of the procedure.  The patient was awakened and transferred to PACU in stable condition.  A chest x-ray will be  performed at that time.  Complications: None  EBL: Minimal  Specimen: None

## 2021-10-18 NOTE — Anesthesia Postprocedure Evaluation (Signed)
Anesthesia Post Note  Patient: Molly Patel  Procedure(s) Performed: INSERTION PORT-A-CATH (Left: Chest)  Patient location during evaluation: PACU Anesthesia Type: General Level of consciousness: awake and alert and oriented Pain management: pain level controlled Vital Signs Assessment: post-procedure vital signs reviewed and stable Respiratory status: spontaneous breathing, nonlabored ventilation and respiratory function stable Cardiovascular status: blood pressure returned to baseline and stable Postop Assessment: no apparent nausea or vomiting Anesthetic complications: no   No notable events documented.   Last Vitals:  Vitals:   10/18/21 1230 10/18/21 1309  BP:  134/67  Pulse:  62  Resp:  18  Temp: 36.7 C   SpO2:  98%    Last Pain:  Vitals:   10/18/21 1309  TempSrc: Oral  PainSc: 0-No pain                 Raymona Boss C Shauntia Levengood

## 2021-10-18 NOTE — Anesthesia Preprocedure Evaluation (Signed)
Anesthesia Evaluation  Patient identified by MRN, date of birth, ID band Patient awake    Reviewed: Allergy & Precautions, NPO status , Patient's Chart, lab work & pertinent test results  History of Anesthesia Complications Negative for: history of anesthetic complications  Airway Mallampati: II  TM Distance: >3 FB Neck ROM: Full    Dental  (+) Dental Advisory Given, Chipped,    Pulmonary shortness of breath and with exertion, former smoker,    Pulmonary exam normal breath sounds clear to auscultation       Cardiovascular Exercise Tolerance: Poor hypertension, Pt. on medications  Rhythm:Irregular Rate:Normal     Neuro/Psych Seizures -,  PSYCHIATRIC DISORDERS Depression CVA    GI/Hepatic negative GI ROS, Neg liver ROS,   Endo/Other  negative endocrine ROS  Renal/GU negative Renal ROS  negative genitourinary   Musculoskeletal negative musculoskeletal ROS (+)   Abdominal   Peds negative pediatric ROS (+)  Hematology  (+) Blood dyscrasia (B Cell lymphoma), ,   Anesthesia Other Findings   Reproductive/Obstetrics negative OB ROS                           Anesthesia Physical Anesthesia Plan  ASA: 4  Anesthesia Plan: General   Post-op Pain Management: Minimal or no pain anticipated   Induction: Intravenous  PONV Risk Score and Plan: 3 and TIVA  Airway Management Planned: Nasal Cannula and Natural Airway  Additional Equipment:   Intra-op Plan:   Post-operative Plan:   Informed Consent: I have reviewed the patients History and Physical, chart, labs and discussed the procedure including the risks, benefits and alternatives for the proposed anesthesia with the patient or authorized representative who has indicated his/her understanding and acceptance.     Dental advisory given  Plan Discussed with: CRNA and Surgeon  Anesthesia Plan Comments:         Anesthesia Quick  Evaluation

## 2021-10-18 NOTE — Transfer of Care (Signed)
Immediate Anesthesia Transfer of Care Note  Patient: Molly Patel  Procedure(s) Performed: INSERTION PORT-A-CATH (Left: Chest)  Patient Location: PACU  Anesthesia Type:General  Level of Consciousness: awake, alert , oriented and patient cooperative  Airway & Oxygen Therapy: Patient Spontanous Breathing and Patient connected to nasal cannula oxygen  Post-op Assessment: Report given to RN, Post -op Vital signs reviewed and stable and Patient moving all extremities X 4  Post vital signs: Reviewed and stable  Last Vitals:  Vitals Value Taken Time  BP 134/67 10/18/21 1309  Temp 36.7 C 10/18/21 1230  Pulse 62 10/18/21 1309  Resp 18 10/18/21 1309  SpO2 98 % 10/18/21 1309    Last Pain:  Vitals:   10/18/21 1309  TempSrc: Oral  PainSc: 0-No pain      Patients Stated Pain Goal: 6 (36/46/80 3212)  Complications: No notable events documented.

## 2021-10-18 NOTE — Interval H&P Note (Signed)
History and Physical Interval Note:  10/18/2021 11:13 AM  Molly Patel  has presented today for surgery, with the diagnosis of B-Cell Lymphoma.  The various methods of treatment have been discussed with the patient and family. After consideration of risks, benefits and other options for treatment, the patient has consented to  Procedure(s): INSERTION PORT-A-CATH (Left) as a surgical intervention.  The patient's history has been reviewed, patient examined, no change in status, stable for surgery.  I have reviewed the patient's chart and labs.  Questions were answered to the patient's satisfaction.     Aviva Signs

## 2021-10-22 ENCOUNTER — Ambulatory Visit: Payer: Medicare PPO | Admitting: Nurse Practitioner

## 2021-10-22 ENCOUNTER — Inpatient Hospital Stay (HOSPITAL_BASED_OUTPATIENT_CLINIC_OR_DEPARTMENT_OTHER): Payer: Medicare PPO | Admitting: Hematology

## 2021-10-22 ENCOUNTER — Other Ambulatory Visit: Payer: Self-pay

## 2021-10-22 ENCOUNTER — Encounter (HOSPITAL_COMMUNITY): Payer: Self-pay | Admitting: General Surgery

## 2021-10-22 VITALS — HR 61 | Temp 96.9°F | Resp 22 | Ht 66.5 in | Wt 173.9 lb

## 2021-10-22 DIAGNOSIS — J90 Pleural effusion, not elsewhere classified: Secondary | ICD-10-CM | POA: Diagnosis not present

## 2021-10-22 DIAGNOSIS — R0789 Other chest pain: Secondary | ICD-10-CM | POA: Diagnosis not present

## 2021-10-22 DIAGNOSIS — C8510 Unspecified B-cell lymphoma, unspecified site: Secondary | ICD-10-CM | POA: Diagnosis not present

## 2021-10-22 DIAGNOSIS — C8330 Diffuse large B-cell lymphoma, unspecified site: Secondary | ICD-10-CM | POA: Diagnosis not present

## 2021-10-22 DIAGNOSIS — Z79899 Other long term (current) drug therapy: Secondary | ICD-10-CM | POA: Diagnosis not present

## 2021-10-22 DIAGNOSIS — Z87891 Personal history of nicotine dependence: Secondary | ICD-10-CM | POA: Diagnosis not present

## 2021-10-22 DIAGNOSIS — Z86011 Personal history of benign neoplasm of the brain: Secondary | ICD-10-CM | POA: Diagnosis not present

## 2021-10-22 DIAGNOSIS — Z7982 Long term (current) use of aspirin: Secondary | ICD-10-CM | POA: Diagnosis not present

## 2021-10-22 MED ORDER — ALLOPURINOL 300 MG PO TABS: 300.0000 mg | ORAL_TABLET | Freq: Every day | ORAL | 2 refills | Status: DC

## 2021-10-22 NOTE — Patient Instructions (Signed)
[place] at [facility] Discharge Instructions   You were seen and examined today by Dr. Delton Coombes.  He reviewed the results of your PET scan.  You have diffuse large B cell lymphoma, mostly involved in your lungs.  We will start you on treatment soon with a regimen called R-CHOP.  This will help alleviate your symptoms the cancer is causing.  In the meantime, we will arrange for you to have the fluid pulled off your lungs.   Return as scheduled for lab work, office visit, and treatment.    Thank you for choosing [place] at [facility] to provide your oncology and hematology care.  To afford each patient quality time with our provider, please arrive at least 15 minutes before your scheduled appointment time.   If you have a lab appointment with the [facility] please come in thru the Main Entrance and check in at the main information desk.  You need to re-schedule your appointment should you arrive 10 or more minutes late.  We strive to give you quality time with our providers, and arriving late affects you and other patients whose appointments are after yours.  Also, if you no show three or more times for appointments you may be dismissed from the clinic at the providers discretion.     Again, thank you for choosing [facility].  Our hope is that these requests will decrease the amount of time that you wait before being seen by our physicians.       _____________________________________________________________  Should you have questions after your visit to [facility], please contact our office at [phone] and follow the prompts.  Our office hours are 8:00 a.m. and 4:30 p.m. Monday - Friday.  Please note that voicemails left after 4:00 p.m. may not be returned until the following business day.  We are closed weekends and major holidays.  You do have access to a nurse 24-7, just call the main number to the  clinic [phone] and do not press any options, hold on the line and a nurse will answer the phone.    For prescription refill requests, have your pharmacy contact our office and allow 72 hours.    Due to Covid, you will need to wear a mask upon entering the hospital. If you do not have a mask, a mask will be given to you at the Main Entrance upon arrival. For doctor visits, patients may have 1 support person age 51 or older with them. For treatment visits, patients can not have anyone with them due to social distancing guidelines and our immunocompromised population.

## 2021-10-22 NOTE — Progress Notes (Signed)
[place], [place] [address]   CLINIC:  Medical Oncology/Hematology  PCP:  [place] Nation, MD [address] / [place] [address] [phone]   REASON FOR VISIT:  Follow-up for large B-cell lymphoma  PRIOR THERAPY: none  NGS Results: not done  CURRENT THERAPY: under work-up  BRIEF ONCOLOGIC HISTORY:  Oncology History   No history exists.    CANCER STAGING: Cancer Staging  No matching staging information was found for the patient.  INTERVAL HISTORY:  Molly Patel, a 76 y.o. female, returns for routine follow-up of her large B-cell lymphoma. [name] was last seen on 10/08/2021.   Today she reports feeling fair. She reports intermittent chest pain around her port site which is present with movement. Vicodin is only providing pain relief for about 2.5 hours. She reports SOB, and she is only able to walk a few steps. She denies weakness in her legs. Her eating is poor.   REVIEW OF SYSTEMS:  Review of Systems  Constitutional:  Positive for appetite change and fatigue.  Respiratory:  Positive for shortness of breath.   Cardiovascular:  Positive for chest pain (7/10).  Neurological:  Negative for extremity weakness.  Psychiatric/Behavioral:  Positive for depression. The patient is nervous/anxious.   All other systems reviewed and are negative.  PAST MEDICAL/SURGICAL HISTORY:  Past Medical History:  Diagnosis Date   Angioedema    B-cell lymphoma ([place])    Depression    Dizziness    Echocardiogram with ECG monitoring 02/23/2011   mild LVH, normal event monitor    Hyperlipidemia    Hypertension    takes meds daily   Incontinence    Insomnia    Ovarian cyst    Seizures ([place])    2 weeks ago   Stroke ([place]) 08/25/2014   Syncope    Vitamin D deficiency    Past Surgical History:  Procedure Laterality Date   ABDOMINAL HYSTERECTOMY     COLONOSCOPY N/A 11/02/2014   Procedure: COLONOSCOPY;  Surgeon: Rogene Houston, MD;   [facility];  Service: Endoscopy;  Laterality: N/A;  140 - moved to 12:55 - Ann to notify pt   CRANIOTOMY  06/29/2012   Procedure: CRANIOTOMY TUMOR EXCISION;  Surgeon: Faythe Ghee, MD;  Location: [facility];  Service: Neurosurgery;  Laterality: Left;  Left frontal Craniotomy for Meningioma   ESOPHAGEAL DILATION N/A 11/02/2014   Procedure: ESOPHAGEAL DILATION;  Surgeon: Rogene Houston, MD;  [facility]: [facility];  Service: Endoscopy;  Laterality: N/A;   ESOPHAGOGASTRODUODENOSCOPY N/A 11/02/2014   Procedure: ESOPHAGOGASTRODUODENOSCOPY (EGD);  Surgeon: Rogene Houston, MD;  [facility];  Service: Endoscopy;  Laterality: N/A;   VESICOVAGINAL FISTULA CLOSURE W/ TAH      SOCIAL HISTORY:  Social History   Socioeconomic History   Marital status: Divorced    Spouse name: Not on file   Number of children: 2   Years of education: Not on file   Highest education level: Not on file  Occupational History   Occupation: Retired    Comment: Tree surgeon  Tobacco Use   Smoking status: Former    Years: 7.00    Types: Cigarettes    Quit date: 08/25/1985    Years since quitting: 36.1   Smokeless tobacco: Never  Vaping Use   Vaping Use: Never used  Substance and Sexual Activity   Alcohol use: No   Drug use: No  Sexual activity: Not on file  Other Topics Concern   Not on file  Social History Narrative   Not on file   Social Determinants of Health   Financial Resource Strain: Not on file  Food Insecurity: Not on file  Transportation Needs: Not on file  Physical Activity: Not on file  Stress: Not on file  Social Connections: Not on file  Intimate Partner Violence: Not on file    FAMILY HISTORY:  Family History  Problem Relation Age of Onset   Stroke Father    Asthma Grandchild    Arthritis Sister    Diabetes Sister    Colon cancer Brother     CURRENT MEDICATIONS:  Current Outpatient Medications  Medication Sig Dispense Refill   aspirin EC 325 MG  tablet Take 1 tablet (325 mg total) by mouth daily. 30 tablet 0   atenolol (TENORMIN) 50 MG tablet TAKE 1 TABLET EVERY DAY (Patient taking differently: Take 50 mg by mouth daily.) 90 tablet 2   cholecalciferol (VITAMIN D) 25 MCG (1000 UNIT) tablet TAKE 1 TABLET EVERY DAY (Patient taking differently: 1,000 Units daily.) 90 tablet 3   donepezil (ARICEPT) 5 MG tablet TAKE 1 TABLET AT BEDTIME (Patient taking differently: Take 5 mg by mouth at bedtime.) 90 tablet 1   hydrochlorothiazide (MICROZIDE) 12.5 MG capsule TAKE 1 CAPSULE EVERY DAY (Patient taking differently: Take 12.5 mg by mouth daily.) 90 capsule 0   HYDROcodone-acetaminophen (NORCO/VICODIN) 5-325 MG tablet Take 1 tablet by mouth every 6 (six) hours as needed for moderate pain or severe pain. 20 tablet 0   levETIRAcetam (KEPPRA) 500 MG tablet Take 1 tablet (500 mg total) by mouth 2 (two) times daily. Needs to establish with new PCP.  Dr. Buelah Manis is no longer at this office.  Must be seen before more refills. 60 tablet 0   memantine (NAMENDA) 5 MG tablet Take 5 mg by mouth daily.     rosuvastatin (CRESTOR) 20 MG tablet Take 20 mg by mouth once a week. Thursday     No current facility-administered medications for this visit.    ALLERGIES:  No Known Allergies  PHYSICAL EXAM:  Performance status (ECOG): 2 - Symptomatic, <50% confined to bed  There were no vitals filed for this visit. Wt Readings from Last 3 Encounters:  10/18/21 163 lb 12.8 oz (74.3 kg)  10/17/21 164 lb (74.4 kg)  10/15/21 164 lb (74.4 kg)   Physical Exam Vitals reviewed.  Constitutional:      Appearance: Normal appearance.     Comments: In wheelchair  Cardiovascular:     Rate and Rhythm: Normal rate and regular rhythm.     Pulses: Normal pulses.     Heart sounds: Normal heart sounds.  Pulmonary:     Effort: Pulmonary effort is normal.     Breath sounds: Normal breath sounds.  Neurological:     General: No focal deficit present.     Mental Status: She is alert  and oriented to person, place, and time.  Psychiatric:        Mood and Affect: Mood normal.        Behavior: Behavior normal.     LABORATORY DATA:  I have reviewed the labs as listed.  CBC Latest Ref Rng & Units 10/08/2021 10/02/2021 09/26/2021  WBC 4.0 - 10.5 K/uL 3.5(L) 3.9(L) 3.7(L)  Hemoglobin 12.0 - 15.0 g/dL 15.2(H) 15.0 14.6  Hematocrit 36.0 - 46.0 % 45.8 45.6 43.4  Platelets 150 - 400 K/uL 160 178 159  CMP Latest Ref Rng & Units 10/08/2021 10/02/2021 09/26/2021  Glucose 70 - 99 mg/dL 88 108(H) 91  BUN 8 - 23 mg/dL 16 6(L) 8  Creatinine 0.44 - 1.00 mg/dL 1.00 1.03(H) 0.94  Sodium 135 - 145 mmol/L 128(L) 131(L) 135  Potassium 3.5 - 5.1 mmol/L 3.7 3.6 3.6  Chloride 98 - 111 mmol/L 89(L) 93(L) 101  CO2 22 - 32 mmol/L _0 Calcium 8.9 - 10.3 mg/dL 9.2 9.2 9.0  Total Protein 6.5 - 8.1 g/dL 6.8 6.2(L) 5.9(L)  Total Bilirubin 0.3 - 1.2 mg/dL 0.9 0.9 1.1  Alkaline Phos 38 - 126 U/L 83 62 56  AST 15 - 41 U/L _1 ALT 0 - 44 U/L _2 DIAGNOSTIC IMAGING:  I have independently reviewed the scans and discussed with the patient. DG Chest 1 View  Result Date: 10/10/2021 CLINICAL DATA:  Post thoracentesis. EXAM: CHEST  1 VIEW COMPARISON:  Multiple chest XRs, most recently 10/02/2021. CT chest, 09/25/2021. FINDINGS: Cardiomediastinal silhouette is within normal limits. The LEFT lung is well inflated. Interval drainage of large RIGHT pleural effusion, with small volume of residual. RIGHT lower lobe opacification, favor atelectasis. No pneumothorax. No acute osseous abnormality. IMPRESSION: 1. Drainage of large RIGHT pleural effusion, with small volume of residual. 2. No postprocedure pneumothorax. Electronically Signed   By: Michaelle Birks M.D.   On: 10/10/2021 10:18   DG Chest 2 View  Result Date: 10/02/2021 CLINICAL DATA:  shortness of breath EXAM: CHEST - 2 VIEW COMPARISON:  Chest radiographs from October 01, 2021 and September 25, 2021. CT of the chest September 25, 2021. FINDINGS:  Large right pleural effusion, similar to yesterday and increased since February 1. Mild overlying opacities, likely atelectasis. Left lung is clear. No visible pneumothorax. Cardiac silhouette is partly obscured. Polyarticular degenerative change. Known paraspinal mass better characterized on prior CT. IMPRESSION: 1. Large right pleural effusion, similar to yesterday and increased since February 1. 2. Probable overlying atelectasis. 3. Known paraspinal mass better characterized on recent CT. Electronically Signed   By: Margaretha Sheffield M.D.   On: 10/02/2021 11:49   DG Chest 2 View  Result Date: 09/24/2021 CLINICAL DATA:  A 76 year old female presents with chest pain and shortness of breath. EXAM: CHEST - 2 VIEW COMPARISON:  June 29, 2012. Also with CT of the abdomen and pelvis of September 24, 2021. FINDINGS: Trachea is midline. Cardiomediastinal contours and hilar structures with obscured RIGHT hilum. Relatively normal appearing LEFT hilum but with widening of the paraspinal stripe in the LEFT chest likely due to paraspinal soft tissue seen on the previous CT of the abdomen and pelvis. Dense opacification of the lower 2/3 of the RIGHT hemithorax. LEFT chest with relatively normal appearance, no opacity or effusion other than some thickening of the paraspinal stripe on the LEFT. No pneumothorax. On limited assessment there is no acute skeletal process. IMPRESSION: 1. Dense opacification of 2/3 of the RIGHT hemithorax, inferior RIGHT chest in this patient with known malignant appearing RIGHT-sided effusion and basilar airspace disease. 2. Mild thickening of the paraspinal stripe on the LEFT likely due to soft tissue along the inferior thoracic spine. CT imaging of the chest may be helpful for further evaluation. Electronically Signed   By: Zetta Bills M.D.   On: 09/24/2021 18:16   CT Angio Chest PE W and/or Wo Contrast  Result Date: 09/25/2021 CLINICAL DATA:  Chest pain, shortness of breath EXAM: CT  ANGIOGRAPHY CHEST WITH CONTRAST TECHNIQUE:  Multidetector CT imaging of the chest was performed using the standard protocol during bolus administration of intravenous contrast. Multiplanar CT image reconstructions and MIPs were obtained to evaluate the vascular anatomy. RADIATION DOSE REDUCTION: This exam was performed according to the departmental dose-optimization program which includes automated exposure control, adjustment of the mA and/or kV according to patient size and/or use of iterative reconstruction technique. CONTRAST:  8m OMNIPAQUE IOHEXOL 350 MG/ML SOLN COMPARISON:  CT abdomen 09/24/2021 FINDINGS: Cardiovascular: Satisfactory opacification of the pulmonary arteries to the segmental level. No evidence of pulmonary embolism. Normal heart size. No pericardial effusion. Mediastinum/Nodes: Mediastinum is shifted towards the left. No enlarged mediastinal, hilar, or axillary lymph nodes. Thyroid gland, trachea, and esophagus demonstrate no significant findings. Large right retroperitoneal soft tissue mass measuring 4.4 x 5.8 x 7 cm which extends superiorly along the right para-aortic region through the diaphragmatic hiatus into the thorax and pleural space. Lungs/Pleura: Large right pleural effusion with partial collapse of the right lung with only a small area of aerated right upper lobe remaining. Severe soft tissue pleural thickening along the posterior aspect of the right pleural space measuring 2.8 cm in maximal thickness extending into the posterior mediastinum. 4.2 x 3.1 cm right pleural based mass along the anteromedial aspect abutting the pericardium. Upper Abdomen: No acute abnormality. Musculoskeletal: No acute osseous abnormality. No aggressive osseous lesion. Right posterior pleural mass abuts the mid and lower thoracic spine. Review of the MIP images confirms the above findings. IMPRESSION: 1. No acute pulmonary embolus. 2. Large right pleural effusion with partial collapse of the right lung  with only a small area of aerated right upper lobe remaining. 3. Partially visualized, large right retroperitoneal soft tissue mass measuring 4.4 x 5.8 x 7 cm which extends superiorly along the right para-aortic region through the diaphragmatic hiatus into the thorax and pleural space. Severe soft tissue pleural thickening along the posterior aspect of the right pleural space measuring 2.8 cm in maximum thickness. 4.2 x 3.1 cm right pleural based mass along the anteromedial aspect abutting the pericardium. Findings are concerning for malignancy. Electronically Signed   By: HKathreen DevoidM.D.   On: 09/25/2021 06:14   MR Brain W Wo Contrast  Result Date: 10/16/2021 CLINICAL DATA:  Hematologic malignancy.  B-cell lymphoma. EXAM: MRI HEAD WITHOUT AND WITH CONTRAST TECHNIQUE: Multiplanar, multiecho pulse sequences of the brain and surrounding structures were obtained without and with intravenous contrast. CONTRAST:  7.54mGADAVIST GADOBUTROL 1 MMOL/ML IV SOLN COMPARISON:  MRI of the brain May 30, 2019. FINDINGS: Brain: Postsurgical changes from left craniotomy and subjacent area of encephalomalacia and gliosis in the left frontal lobe. No focal area of abnormal contrast enhancement to suggest residual/recurrent tumor. Scattered and confluent foci of T2 hyperintensity are seen within the white matter of the cerebral hemispheres and within the pons, nonspecific, progressed since prior MRI. Innumerable foci of susceptibility artifact in the bilateral cerebral and cerebellar sulci. Although this is more prominent than on prior MRI, this could be at least in part related to technique. Ectatic lateral ventricles likely related to central parenchymal volume loss. No parenchymal focus of abnormal contrast enhancement. Enlarge, partial empty sella, unchanged. Vascular: Normal flow voids. Skull and upper cervical spine: Postsurgical changes from left frontal craniotomy. Sinuses/Orbits: Mild scattered mucosal thickening  throughout the paranasal sinuses. The orbits are maintained. Other: None. IMPRESSION: 1. Postsurgical changes from resection of left frontal extra-axial lesion without evidence of residual/recurrent tumor. 2. Mild interval progression of chronic white matter disease. 3. Innumerable  foci of susceptibility artifact along the cerebral and cerebellar sulci, consistent with prior microhemorrhages, suspicious for underlying amyloid angiopathy. Although this appear more prominent than on prior MRI, this could be at least in part related to technique. Electronically Signed   By: Pedro Earls M.D.   On: 10/16/2021 17:27   NM PET Image Initial (PI) Skull Base To Thigh (F-18 FDG)  Result Date: 10/20/2021 CLINICAL DATA:  Initial treatment strategy for high-grade B-cell lymphoma. EXAM: NUCLEAR MEDICINE PET SKULL BASE TO THIGH TECHNIQUE: 8.719 mCi F-18 FDG was injected intravenously. Full-ring PET imaging was performed from the skull base to thigh after the radiotracer. CT data was obtained and used for attenuation correction and anatomic localization. Fasting blood glucose: 90 mg/dl COMPARISON:  Chest CT 09/25/2021 and CT abdomen 09/24/2021 FINDINGS: Mediastinal blood pool activity: SUV max 1.88 Liver activity: SUV max 2.22 NECK: Single 7 mm left-sided level 3 lymph node is hypermetabolic with SUV max of 6.75. No neck mass. Left sided supraclavicular node adjacent to the thyroid gland on image 63/3 measures 10.5 mm and SUV max is 10.54. Incidental CT findings: none CHEST: Extensive hypermetabolic bilateral axillary and subpectoral adenopathy. 11 mm right axillary node has an SUV max of 7.20 and 8 mm left axillary node has an SUV max of 9.45 Large masslike area of adenopathy in the region of the right internal mammary nodes measures 4.4 x 1.3 cm and SUV max is 15.83 I suspect there is adjacent involvement of the sternum. Extensive right-sided pleural disease with SUV max of 18.30 and associated large right  pleural effusion. No pulmonary nodules. Incidental CT findings: Scattered aortic and coronary artery calcifications. ABDOMEN/PELVIS: Large nodal mass of retroperitoneal adenopathy the measuring approximately 10.5 x 5.8 cm has an SUV max of 18.63. This extends into the right pararenal space and appears to also involve the right ribs, right paravertebral musculature and also the right-side of the spine most notably at T11 and T12. Intraspinal lymphoma is suspected. MRI thoracic spine without and with contrast may be helpful for further evaluation of the spinal canal and spinal cord. Hypermetabolic pelvic nodes and inguinal nodes consistent with lymphoma. Right obturator node measures 8 mm and SUV max is 8.65 Incidental CT findings: Scattered aortic calcifications. SKELETON: Diffuse osseous involvement of lymphoma is noted with involvement of the spine, ribs, sternum, bilateral humeri, pelvis and bilateral femurs. Left femoral lesion at and below the level of the lesser trochanter has an SUV max of 10.21. Right humeral shaft lesion has an SUV max of 4.31. Incidental CT findings: none IMPRESSION: 1. Widespread/extensive hypermetabolic lymphoma ([place] [facility]) as detailed above. Extensive involvement of the right hemithorax with significant pleural tumor and associated pleural effusion. 2. Extensive involvement of the retroperitoneum and widespread osseous disease. 3. Suspect spinal canal involvement in the lower thoracic area. MRI thoracic spine without and with contrast may be helpful for further evaluation. 4. Splenic lymphoma. Electronically Signed   By: Marijo Sanes M.D.   On: 10/20/2021 15:17   CT BIOPSY  Result Date: 09/26/2021 CLINICAL DATA:  Posterior mediastinal and retroperitoneal mass and adenopathy EXAM: CT GUIDED CORE BIOPSY OF RETROPERITONEAL MASS ANESTHESIA/SEDATION: Intravenous Fentanyl 161mg and Versed 225mwere administered as conscious sedation during continuous monitoring of the patient's level of  consciousness and physiological / cardiorespiratory status by the radiology RN, with a total moderate sedation time of 15 minutes. PROCEDURE: The procedure risks, benefits, and alternatives were explained to the patient. Questions regarding the procedure were encouraged and answered. The  patient understands and consents to the procedure. Patient placed prone. Select axial scans through the upper abdomen were obtained. The right posterior pleural/retroperitoneal mass was localized and an appropriate skin entry site was determined and marked. The operative field was prepped with chlorhexidinein a sterile fashion, and a sterile drape was applied covering the operative field. A sterile gown and sterile gloves were used for the procedure. Local anesthesia was provided with 1% Lidocaine. Under CT fluoroscopic guidance, a 17 gauge trocar needle was advanced to the margin of the lesion. Once needle tip position was confirmed, coaxial 18-gauge core biopsy samples were obtained, submitted in saline to surgical pathology. The guide needle was removed. Postprocedure images show no hemorrhage or other apparent complication. The patient tolerated the procedure well. RADIATION DOSE REDUCTION: This exam was performed according to the departmental dose-optimization program which includes automated exposure control, adjustment of the mA and/or kV according to patient size and/or use of iterative reconstruction technique. COMPLICATIONS: None immediate FINDINGS: Right posterior pleural and contiguous retroperitoneal mass was localized. Representative core biopsy samples obtained under CT guidance. IMPRESSION: 1. Technically successful CT-guided core biopsy, right retroperitoneal mass. Electronically Signed   By: Lucrezia Europe M.D.   On: 09/26/2021 15:54   DG Chest Port 1 View  Result Date: 10/18/2021 CLINICAL DATA:  B-cell lymphoma, post Port-A-Cath placement EXAM: PORTABLE CHEST 1 VIEW COMPARISON:  Portable exam 1237 hours compared  to 10/10/2021 FINDINGS: New LEFT subclavian Port-A-Cath with tip projecting over SVC. Normal heart size and mediastinal contours. Interval increase in RIGHT pleural effusion and basilar atelectasis since prior study. No acute infiltrate or pneumothorax. Bones unremarkable. IMPRESSION: No pneumothorax following Port-A-Cath placement. Increased RIGHT pleural effusion and basilar atelectasis. Electronically Signed   By: Lavonia Dana M.D.   On: 10/18/2021 13:14   DG Chest Port 1 View  Result Date: 09/25/2021 CLINICAL DATA:  Status post thoracentesis EXAM: PORTABLE CHEST 1 VIEW COMPARISON:  09/24/2021 FINDINGS: Significant decrease in the right pleural effusion with a small amount of pleural fluid remaining. Right basilar airspace disease. Persistent right pleural thickening along the paramediastinal aspect. Left lung is clear. No pneumothorax. Stable cardiomediastinal silhouette. No acute osseous abnormality. IMPRESSION: 1. Significant decrease in the right pleural effusion with a small amount of pleural fluid remaining. No pneumothorax. 2. Persistent right basilar airspace disease. Electronically Signed   By: Kathreen Devoid M.D.   On: 09/25/2021 12:52   DG C-Arm 1-60 Min-No Report  Result Date: 10/18/2021 Fluoroscopy was utilized by the requesting physician.  No radiographic interpretation.   ECHOCARDIOGRAM COMPLETE  Result Date: 10/14/2021    ECHOCARDIOGRAM REPORT   Patient Name:   SHAQUELLA STAMANT Date of Exam: 10/14/2021 Medical Rec #:  [id number]        Height:       66.5 in Accession #:    [id number]       Weight:       173.0 lb Date of Birth:  1946/08/19         BSA:          1.891 m Patient Age:    26 years         BP:           136/74 mmHg Patient Gender: F                HR:           65 bpm. Exam Location:  [address] Procedure: 2D Echo, Cardiac Doppler and Color Doppler Indications:  C85.10 Lymphoma  History:        Patient has prior history of Echocardiogram examinations, most                  recent 03/03/2011. Stroke, Lymphoma; Risk Factors:Hypertension,                 Dyslipidemia and Former Smoker.  Sonographer:    Coralyn Helling RDCS Referring Phys: [place]  1. Left ventricular ejection fraction, by estimation, is 50 to 55%. The left ventricle has low normal function. The left ventricle has no regional wall motion abnormalities. There is mild concentric left ventricular hypertrophy. Left ventricular diastolic parameters were normal.  2. Right ventricular systolic function is normal. The right ventricular size is normal.  3. A small pericardial effusion is present. The pericardial effusion is localized near the right atrium. There is no evidence of cardiac tamponade.  4. The mitral valve is grossly normal. Mild to moderate mitral valve regurgitation. No evidence of mitral stenosis.  5. The aortic valve is tricuspid. Aortic valve regurgitation is not visualized. No aortic stenosis is present. FINDINGS  Left Ventricle: Left ventricular ejection fraction, by estimation, is 50 to 55%. The left ventricle has low normal function. The left ventricle has no regional wall motion abnormalities. The left ventricular internal cavity size was normal in size. There is mild concentric left ventricular hypertrophy. Left ventricular diastolic parameters were normal. Right Ventricle: The right ventricular size is normal. No increase in right ventricular wall thickness. Right ventricular systolic function is normal. Left Atrium: Left atrial size was normal in size. Right Atrium: Right atrial size was normal in size. Pericardium: A small pericardial effusion is present. The pericardial effusion is localized near the right atrium. There is no evidence of cardiac tamponade. Mitral Valve: The mitral valve is grossly normal. Mild to moderate mitral valve regurgitation. No evidence of mitral valve stenosis. Tricuspid Valve: The tricuspid valve is grossly normal. Tricuspid valve regurgitation is  mild . No evidence of tricuspid stenosis. Aortic Valve: The aortic valve is tricuspid. Aortic valve regurgitation is not visualized. No aortic stenosis is present. Pulmonic Valve: The pulmonic valve was grossly normal. Pulmonic valve regurgitation is trivial. No evidence of pulmonic stenosis. Aorta: The aortic root and ascending aorta are structurally normal, with no evidence of dilitation. Venous: The inferior vena cava was not well visualized. IAS/Shunts: The atrial septum is grossly normal.  LEFT VENTRICLE PLAX 2D LVIDd:         4.50 cm   Diastology LVIDs:         3.20 cm   LV e' medial:    7.80 cm/s LV PW:         1.40 cm   LV E/e' medial:  6.5 LV IVS:        1.20 cm   LV e' lateral:   5.62 cm/s LVOT diam:     2.00 cm   LV E/e' lateral: 9.0 LV SV:         39 LV SV Index:   21 LVOT Area:     3.14 cm  RIGHT VENTRICLE RV S prime:     17.10 cm/s TAPSE (M-mode): 1.4 cm RVSP:           34.4 mmHg LEFT ATRIUM             Index        RIGHT ATRIUM           Index LA diam:  3.70 cm 1.96 cm/m   RA Pressure: 3.00 mmHg LA Vol (A2C):   40.8 ml 21.58 ml/m  RA Area:     7.25 cm LA Vol (A4C):   42.2 ml 22.32 ml/m  RA Volume:   12.70 ml  6.72 ml/m LA Biplane Vol: 42.2 ml 22.32 ml/m  AORTIC VALVE LVOT Vmax:   62.30 cm/s LVOT Vmean:  41.900 cm/s LVOT VTI:    0.124 m  AORTA Ao Root diam: 3.70 cm Ao Asc diam:  3.00 cm MITRAL VALVE               TRICUSPID VALVE MV Area (PHT): 2.21 cm    TR Peak grad:   31.4 mmHg MV Decel Time: 343 msec    TR Vmax:        280.00 cm/s MV E velocity: 50.84 cm/s  Estimated RAP:  3.00 mmHg MV A velocity: 74.00 cm/s  RVSP:           34.4 mmHg MV E/A ratio:  0.69                            SHUNTS                            Systemic VTI:  0.12 m                            Systemic Diam: 2.00 cm Eleonore Chiquito MD Electronically signed by Eleonore Chiquito MD Signature Date/Time: 10/14/2021/5:49:47 PM    Final    CT Renal Stone Study  Result Date: 09/24/2021 CLINICAL DATA:  Right flank pain. EXAM:  CT ABDOMEN AND PELVIS WITHOUT CONTRAST TECHNIQUE: Multidetector CT imaging of the abdomen and pelvis was performed following the standard protocol without IV contrast. RADIATION DOSE REDUCTION: This exam was performed according to the departmental dose-optimization program which includes automated exposure control, adjustment of the mA and/or kV according to patient size and/or use of iterative reconstruction technique. COMPARISON:  None. FINDINGS: Lower chest: There is a moderate right pleural effusion. A soft tissue rind is identified overlying which overlies the posterior and medial right lung with overlying moderate right pleural effusion. Tumor rind extends into the posterior mediastinum, image 16/3. Hepatobiliary: No focal liver abnormality identified. Gallbladder appears normal. No bile duct dilatation. Pancreas: Pancreas is unremarkable. Spleen: Spleen measures 9.8 cm in cranial caudal dimension. No focal splenic lesion. Adrenals/Urinary Tract: Normal appearance of the adrenal glands. No signs of nephrolithiasis or hydronephrosis. Urinary bladder appears normal for degree of distension. Stomach/Bowel: Stomach appears within normal limits. The appendix is not confidently identified. No bowel wall thickening, inflammation, or distension. Vascular/Lymphatic: Aortic atherosclerosis without aneurysm. Large right retroperitoneal nodal mass measures 4.4 by 5.7 by 7.1 cm, image 31/3 and image 71/3. Within the central small bowel mesentery there is a conglomeration of enlarged lymph nodes/mass which measures 2.1 x 6.3 by 7.1 cm, image 48/3 and image 36/6. Right pelvic sidewall lymph node is borderline enlarged measuring 1 cm, image 70/3. Reproductive: The uterus appears surgically absent. Indeterminate ovoid mass within the left adnexal region measures 3.7 x 2.1 cm, image 69/3. Other: No significant free fluid identified. Musculoskeletal: Degenerative disc disease noted at L5-S1. No aggressive lytic or sclerotic bone  lesions. IMPRESSION: 1. Large nodal masses identified within right retroperitoneum and small bowel mesentery. Findings are concerning for either lymphoproliferative disorder or metastatic  disease. Additionally, there is a rind of increased soft tissue overlying the posteromedial right lower lobe with extension into the posterior mediastinum. This is also worrisome for malignancy. Further evaluation with PET-CT and tissue sampling is recommended. 2. A moderate right pleural effusion overlies the soft tissue rind within the right hemithorax. Consider further evaluation with diagnostic thoracentesis. 3. Indeterminate ovoid mass within the left adnexal region measures 3.7 x 2.1 cm. Attention in this area on PET-CT is advised. 4. Borderline splenomegaly. 5. Aortic Atherosclerosis (ICD10-I70.0). Electronically Signed   By: Kerby Moors M.D.   On: 09/24/2021 16:58   US THORACENTESIS ASP PLEURAL SPACE W/IMG GUIDE  Result Date: 10/10/2021 INDICATION: Symptomatic RIGHT sided pleural effusion EXAM: US THORACENTESIS ASP PLEURAL SPACE W/IMG GUIDE COMPARISON:  Chest XR, most recently 10/02/2021 MEDICATIONS: None. COMPLICATIONS: None immediate. TECHNIQUE: Informed written consent was obtained from the patient after a discussion of the risks, benefits and alternatives to treatment. A timeout was performed prior to the initiation of the procedure. Initial ultrasound scanning demonstrates a large volume RIGHT pleural effusion. The lower chest was prepped and draped in the usual sterile fashion. 1% lidocaine was used for local anesthesia. An ultrasound image was saved for documentation purposes. An 8 Fr Safe-T-Centesis catheter was introduced. The thoracentesis was performed. The catheter was removed and a dressing was applied. The patient tolerated the procedure well without immediate post procedural complication. The patient was escorted to have an upright chest radiograph. FINDINGS: A total of approximately 1.5 L of serous  fluid was removed. Requested samples were sent to the laboratory. IMPRESSION: Successful ultrasound-guided RIGHT-sided diagnostic and therapeutic thoracentesis, yielding 1.5 L of pleural fluid. Michaelle Birks, MD [facility] Electronically Signed   By: Michaelle Birks M.D.   On: 10/10/2021 11:44     ASSESSMENT:  Stage IIIb DLBCL: - Weakness, 12 pound weight loss over the last 2 months - CT renal study on 09/24/2021: Right retroperitoneal nodal mass measuring 4.4 x 5.7 x 7.1 cm.  Enlarged lymph node mass in the central small bowel mesentery measuring 2.1 x 6.3 x 7.1 cm.  Right pelvic sidewall lymph node borderline enlarged measuring 1 cm.  Indeterminate ovoid mass in the left adnexal region measuring 3.7 x 2.1 cm. - CT chest angiogram on 09/25/2021 negative for PE.  Large right pleural effusion with partial collapse of the right lung.  Partially seen large right retroperitoneal soft tissue mass measuring 4.4 x 5.8 x 7 cm extends superiorly along the right para-aortic region through the diaphragmatic hiatus into the thorax and pleural space.  Severe soft tissue pleural thickening along the posterior aspect of the right pleural space measuring 2.8 cm in maximal thickness.  4.2 x 3.1 cm right pleural-based mass along the anteromedial aspect abutting the pericardium. - Right pleural fluid flow cytometry-monoclonal B-cell population with coexpression of CD10 comprising 69% of all lymphocytes. - Right retroperitoneal mass needle biopsy on 09/26/2021 consistent with large B-cell type (GCB subtype) is favored.  Large lymphoid cells are positive for CD79a, CD20, CD10, BCL6 and Bcl-2.  Ki-67 up to 50% in some areas.    Social/family history: - She [facility] of [facility]. - She is currently living with her sister and niece.  Niece takes care of her.  She is a retired Occupational psychologist.  No  exposure to chemicals.  Non-smoker. - Brother had throat cancer.  Another brother had liver  cancer.   PLAN:  Stage IVb diffuse large B-cell lymphoma: - We have reviewed PET scan images with the patient and her daughter. - PET scan from 10/17/2021 showed widespread/extensive lymphoma on both sides of the diaphragm as well as involvement of retroperitoneum and widespread osseous disease.  Extensive involvement of the right hemithorax with significant pleural tumor and associated pleural effusion.  Spleen involved. - Question spinal canal involvement in the lower thoracic area. - We have reviewed results of the MRI of the brain which showed postsurgical changes in the left frontal area.  No evidence of lymphomatous involvement. - I have recommended MRI of thoracic spine with and without contrast. - We discussed treatment with R-CHOP based regimen. - We discussed side effects in detail. - We will start out with R mini CHOP with dose reduction. - I will also ask our pathology to do high risk lymphoma panel.  2.  Right pleural effusion: - Last thoracentesis on 10/10/2021 with 1.5 L removed. - We will arrange for another thoracentesis.  3.  Right lateral chest wall pain: - She reports some pain at the port site.  Port site looks fine. - She will continue hydrocodone as needed.  4.  TLS prophylaxis: - Due to the bulk of disease, I will address per case on day 1 of treatment. - We will also start her on allopurinol 300 mg daily.  We have sent a prescription.  5.  High risk disease monitoring: - We have reviewed 2D echo from 10/15/2021 with EF 50-55%.   Orders placed this encounter:  No orders of the defined types were placed in this encounter.    Derek Jack, MD [place] [phone]   I, Thana Ates, am acting as a scribe for Dr. Derek Jack.  I, Derek Jack MD, have reviewed the above documentation for accuracy and completeness, and I agree  with the above.

## 2021-10-22 NOTE — Progress Notes (Signed)
High risk lymphoma panel sent on [id number] per Dr. Delton Coombes

## 2021-10-22 NOTE — Progress Notes (Signed)

## 2021-10-23 NOTE — Progress Notes (Signed)
Pharmacist Chemotherapy Monitoring - Initial Assessment   ? ?Anticipated start date: 10/30/21   ? ?The following has been reviewed per standard work regarding the patient's treatment regimen: ?The patient's diagnosis, treatment plan and drug doses, and organ/hematologic function ?Lab orders and baseline tests specific to treatment regimen  ?The treatment plan start date, drug sequencing, and pre-medications ?Prior authorization status  ?Patient's documented medication list, including drug-drug interaction screen and prescriptions for anti-emetics and supportive care specific to the treatment regimen ?The drug concentrations, fluid compatibility, administration routes, and timing of the medications to be used ?The patient's access for treatment and lifetime cumulative dose history, if applicable  ?The patient's medication allergies and previous infusion related reactions, if applicable  ? ?Changes made to treatment plan:  ?treatment plan date ? ?Follow up needed:  ?Pending authorization for treatment  ? ? ?Molly Patel [place], [place], ?10/23/2021  3:00 PM  ?

## 2021-10-24 ENCOUNTER — Ambulatory Visit (HOSPITAL_COMMUNITY)
Admission: RE | Admit: 2021-10-24 | Discharge: 2021-10-24 | Disposition: A | Discharge status: Home / Self Care | Payer: Medicare PPO | Source: Ambulatory Visit | Attending: Hematology | Admitting: Hematology

## 2021-10-24 ENCOUNTER — Other Ambulatory Visit: Payer: Self-pay

## 2021-10-24 ENCOUNTER — Ambulatory Visit (HOSPITAL_COMMUNITY)
Admission: RE | Admit: 2021-10-24 | Discharge: 2021-10-24 | Disposition: A | Discharge status: Home / Self Care | Payer: Medicare PPO | Source: Ambulatory Visit | Attending: Radiology | Admitting: Radiology

## 2021-10-24 ENCOUNTER — Encounter (HOSPITAL_COMMUNITY): Payer: Self-pay

## 2021-10-24 DIAGNOSIS — C8590 Non-Hodgkin lymphoma, unspecified, unspecified site: Secondary | ICD-10-CM | POA: Diagnosis not present

## 2021-10-24 DIAGNOSIS — Z9889 Other specified postprocedural states: Secondary | ICD-10-CM | POA: Insufficient documentation

## 2021-10-24 DIAGNOSIS — C8510 Unspecified B-cell lymphoma, unspecified site: Secondary | ICD-10-CM | POA: Diagnosis not present

## 2021-10-24 DIAGNOSIS — Z48813 Encounter for surgical aftercare following surgery on the respiratory system: Secondary | ICD-10-CM | POA: Diagnosis not present

## 2021-10-24 DIAGNOSIS — I517 Cardiomegaly: Secondary | ICD-10-CM | POA: Diagnosis not present

## 2021-10-24 DIAGNOSIS — J90 Pleural effusion, not elsewhere classified: Secondary | ICD-10-CM | POA: Insufficient documentation

## 2021-10-24 DIAGNOSIS — J9811 Atelectasis: Secondary | ICD-10-CM | POA: Diagnosis not present

## 2021-10-24 NOTE — Progress Notes (Signed)
PT tolerated right sided thoracentesis procedure well today and 1.4 Liters of amber colored fluid removed. Chest xray completed post procedure and read by radiologist prior to patient being discharged. PT verbalized understanding of discharge instructions and pushed to waiting area via wheelchair to two family members waiting with no acute distress noted.  ?

## 2021-10-24 NOTE — Procedures (Signed)
? ?  Lymphoma ?Recurrent right pleural effusion ? ?US guided Rt thoracentesis ? ?1.4 L amber fluid ?Tolerated well ?No labs per MD ? ?Post CXR:  NO PTX per Dr Thornton Papas ?

## 2021-10-25 ENCOUNTER — Encounter: Payer: Self-pay | Admitting: Nurse Practitioner

## 2021-10-25 ENCOUNTER — Ambulatory Visit (INDEPENDENT_AMBULATORY_CARE_PROVIDER_SITE_OTHER): Payer: Medicare PPO | Admitting: Nurse Practitioner

## 2021-10-25 VITALS — BP 115/72 | HR 62 | Ht 66.0 in | Wt 174.0 lb

## 2021-10-25 DIAGNOSIS — I10 Essential (primary) hypertension: Secondary | ICD-10-CM

## 2021-10-25 DIAGNOSIS — G4700 Insomnia, unspecified: Secondary | ICD-10-CM

## 2021-10-25 DIAGNOSIS — R1011 Right upper quadrant pain: Secondary | ICD-10-CM

## 2021-10-25 DIAGNOSIS — R4189 Other symptoms and signs involving cognitive functions and awareness: Secondary | ICD-10-CM | POA: Diagnosis not present

## 2021-10-25 DIAGNOSIS — C8338 Diffuse large B-cell lymphoma, lymph nodes of multiple sites: Secondary | ICD-10-CM

## 2021-10-25 DIAGNOSIS — E7800 Pure hypercholesterolemia, unspecified: Secondary | ICD-10-CM | POA: Diagnosis not present

## 2021-10-25 MED ORDER — TRAZODONE HCL 50 MG PO TABS: 25.0000 mg | ORAL_TABLET | Freq: Every evening | ORAL | 0 refills | Status: DC | PRN

## 2021-10-25 MED ORDER — ROSUVASTATIN CALCIUM 20 MG PO TABS
20.0000 mg | ORAL_TABLET | ORAL | 1 refills | Status: DC
Start: 2021-10-25 — End: 2022-01-28

## 2021-10-25 NOTE — Patient Instructions (Signed)
trazodone  Take 0.5-1 tablets (25-50 mg total) by mouth at bedtime as needed for sleep ? ? ? ?Thanks for choosing [facility], we consider it a privelige to serve you. ? ? ?

## 2021-10-25 NOTE — Progress Notes (Signed)
New Patient Office Visit  Subjective:  Patient ID: JOZALYN BAGLIO, female    DOB: Apr 10, 1946  Age: 76 y.o. MRN: [id number]  CC:  Chief Complaint  Patient presents with   New Patient (Initial Visit)    NP   Insomnia    Almost every night she is unable to sleep    Abdominal Pain    Had stomach pain this morning has taken pain medicine and it has helped not in pain at the moment    HPI Donalynn Furlong past medical history of hypertension overactive bladder hyperlipidemia CVA diffuse large B cell lymphoma pleural effusion presents to establish care for high chronic medical conditions. Previous PCP was Dr Grandville Silos of [facility]. Last visit to previous PCP was in Dec., 2022.  Patient is accompanied to today's visit by her niece, patient states that she is currently living with her sister.   Stage III Bb DLBCL .pt newly diagnosed with lymphoma, starts chemotherapy next week , followed by Dr Delton Coombes.  Patient states that she has had thoracentesis for right pleural effusion.  Takes allopurinol 300 mg daily for TLS prophylaxis.  Takes hydrocodone acetaminophen 5-325 mg tablet every 6 hours as needed for moderate to severe pain.   HTN.  Takes atenolol 50 mg daily, hydrochlorothiazide 12.5 mg daily.   HLD, CVA Takes Crestor 20 mg once a week.  Has history of CVA status post craniectomy takes aspirin 325 mg daily  Seizures takes Keppra 500 mg twice daily patient denies recent seizures,   Cognitive changes.  Takes Aricept 5 mg tablet daily Namenda 5 mg daily.  Patient alert and oriented at today's visit no sign of confusion noted.   Pt c/o insomnia, she has been unable to sleep every night, she has fluids pulled off her lungs last week.  Patient is currently not taking any medication for insomnia.   Patient is due for COVID booster, flu vaccine, shingles vaccine.  Need for all vaccines discussed with patient and her niece they verbalized understanding.   Past Medical History:  Diagnosis  Date   Angioedema    B-cell lymphoma ([place])    Depression    Dizziness    Echocardiogram with ECG monitoring 02/23/2011   mild LVH, normal event monitor    Hyperlipidemia    Hypertension    takes meds daily   Incontinence    Insomnia    Ovarian cyst    Seizures ([place])    2 weeks ago   Stroke ([place]) 08/25/2014   Syncope    Vitamin D deficiency     Past Surgical History:  Procedure Laterality Date   ABDOMINAL HYSTERECTOMY     COLONOSCOPY N/A 11/02/2014   Procedure: COLONOSCOPY;  Surgeon: Rogene Houston, MD;  [facility];  Service: Endoscopy;  Laterality: N/A;  140 - moved to 12:55 - Ann to notify pt   CRANIOTOMY  06/29/2012   Procedure: CRANIOTOMY TUMOR EXCISION;  Surgeon: Faythe Ghee, MD;  Location: [facility];  Service: Neurosurgery;  Laterality: Left;  Left frontal Craniotomy for Meningioma   ESOPHAGEAL DILATION N/A 11/02/2014   Procedure: ESOPHAGEAL DILATION;  Surgeon: Rogene Houston, MD;  Location: [facility];  Service: Endoscopy;  Laterality: N/A;   ESOPHAGOGASTRODUODENOSCOPY N/A 11/02/2014   Procedure: ESOPHAGOGASTRODUODENOSCOPY (EGD);  Surgeon: Rogene Houston, MD;  Location: [facility];  Service: Endoscopy;  Laterality: N/A;   PORTACATH PLACEMENT Left 10/18/2021   Procedure: INSERTION PORT-A-CATH;  Surgeon: Aviva Signs, MD;  Location:  [facility];  Service: General;  Laterality: Left;   VESICOVAGINAL FISTULA CLOSURE W/ TAH      Family History  Problem Relation Age of Onset   Stroke Father    Asthma Grandchild    Arthritis Sister    Diabetes Sister    Colon cancer Brother     Social History   Socioeconomic History   Marital status: Divorced    Spouse name: Not on file   Number of children: 2   Years of education: Not on file   Highest education level: Not on file  Occupational History   Occupation: Retired    Comment: Tree surgeon  Tobacco Use   Smoking status: Former    Years: 7.00    Types: Cigarettes    Quit date: 08/25/1985     Years since quitting: 36.1   Smokeless tobacco: Never  Vaping Use   Vaping Use: Never used  Substance and Sexual Activity   Alcohol use: No   Drug use: No   Sexual activity: Not on file  Other Topics Concern   Not on file  Social History Narrative   Not on file   Social Determinants of Health   Financial Resource Strain: Not on file  Food Insecurity: Not on file  Transportation Needs: Not on file  Physical Activity: Not on file  Stress: Not on file  Social Connections: Not on file  Intimate Partner Violence: Not on file    ROS Review of Systems  Constitutional:  Positive for fatigue.  Respiratory: Negative.    Cardiovascular: Negative.   Gastrointestinal:  Positive for abdominal pain.  Neurological:  Positive for weakness.  Psychiatric/Behavioral:  Positive for sleep disturbance. Negative for agitation, behavioral problems, confusion, dysphoric mood and hallucinations.    Objective:   Today's Vitals: BP 115/72 (BP Location: Left Arm, Patient Position: Sitting, Cuff Size: Normal)    Pulse 62    Ht 5\' 6"  (1.676 m)    Wt 174 lb (78.9 kg)    SpO2 98%    BMI 28.08 kg/m   Physical Exam Constitutional:      Appearance: She is ill-appearing.  Cardiovascular:     Rate and Rhythm: Normal rate and regular rhythm.     Heart sounds: No murmur heard.   No friction rub. No gallop.  Pulmonary:     Effort: Pulmonary effort is normal. No respiratory distress.     Breath sounds: Normal breath sounds. No stridor. No wheezing, rhonchi or rales.  Chest:     Chest wall: No tenderness.  Abdominal:     Palpations: Abdomen is soft.     Tenderness: There is abdominal tenderness in the right upper quadrant and right lower quadrant.  Musculoskeletal:     Comments: Sitting in a wheelchair, states that she gets up and walk at home  Neurological:     Mental Status: She is alert and oriented to person, place, and time.  Psychiatric:        Mood and Affect: Mood normal.        Behavior:  Behavior normal.    Assessment & Plan:   Problem List Items Addressed This Visit       Cardiovascular and Mediastinum   Hypertension    BP Readings from Last 3 Encounters:  10/25/21 115/72  10/24/21 111/84  10/18/21 134/67  Condition well-controlled, continue hydrochlorothiazide 12.5 mg daily, atenolol 50 mg daily No labs today      Relevant Medications   rosuvastatin (CRESTOR) 20 MG tablet  Other   Insomnia - Primary    Chronic condition Start trazodone 25 mg to 50 mg as needed at bedtime for insomnia Takes Vicodin 5-325 mg 1 tablet every 6 hours as needed for pain.  I told the patient that Vicodin can patient drowsy and sleepy take trazodone only when needed to avoid drowsiness.  He verbalized understanding.      Relevant Medications   traZODone (DESYREL) 50 MG tablet   Hyperlipidemia    History of CVA status post craniectomy takes aspirin 325 mg tablet daily Continue Crestor 20 mg daily.  Check lipid panel at next visit Lab Results  Component Value Date   CHOL 215 (H) 09/25/2021   HDL 72 10/03/2020   LDLCALC 94 10/03/2020   TRIG 138 10/03/2020   CHOLHDL 2.6 10/03/2020        Relevant Medications   rosuvastatin (CRESTOR) 20 MG tablet   Cognitive changes    Chronic condition well-controlled patient alert and oriented during today's visit  on Aricept 5 mg daily, Namenda 5 mg daily      Diffuse large B-cell lymphoma ([place])    pt newly diagnosed with lymphoma, starts chemotherapy next week , followed by Dr Delton Coombes.  Patient states that she has had thoracentesis for right pleural effusion.  Takes allopurinol 300 mg daily for TLS prophylaxis.  Takes hydrocodone acetaminophen 5-325 mg tablet every 6 hours as needed for moderate to severe pain.  Patient encouraged to maintain close follow-up with oncology she verbalized understanding      Abdominal pain    Most likely due to lymphoma Take Vicodin 5 325 mg tablet every 6 hours as needed for pain Patient denies  nausea vomiting constipation.  Patient told to take MiraLAX as needed if she develops constipation she  verbalized understanding       Outpatient Encounter Medications as of 10/25/2021  Medication Sig   allopurinol (ZYLOPRIM) 300 MG tablet Take 1 tablet (300 mg total) by mouth daily.   aspirin EC 325 MG tablet Take 1 tablet (325 mg total) by mouth daily.   atenolol (TENORMIN) 50 MG tablet TAKE 1 TABLET EVERY DAY (Patient taking differently: Take 50 mg by mouth daily.)   cholecalciferol (VITAMIN D) 25 MCG (1000 UNIT) tablet TAKE 1 TABLET EVERY DAY (Patient taking differently: 1,000 Units daily.)   donepezil (ARICEPT) 5 MG tablet TAKE 1 TABLET AT BEDTIME (Patient taking differently: Take 5 mg by mouth at bedtime.)   hydrochlorothiazide (MICROZIDE) 12.5 MG capsule TAKE 1 CAPSULE EVERY DAY (Patient taking differently: Take 12.5 mg by mouth daily.)   HYDROcodone-acetaminophen (NORCO/VICODIN) 5-325 MG tablet Take 1 tablet by mouth every 6 (six) hours as needed for moderate pain or severe pain.   levETIRAcetam (KEPPRA) 500 MG tablet Take 1 tablet (500 mg total) by mouth 2 (two) times daily. Needs to establish with new PCP.  Dr. Buelah Manis is no longer at this office.  Must be seen before more refills.   memantine (NAMENDA) 5 MG tablet Take 5 mg by mouth daily.   traZODone (DESYREL) 50 MG tablet Take 0.5-1 tablets (25-50 mg total) by mouth at bedtime as needed for sleep.   [DISCONTINUED] rosuvastatin (CRESTOR) 20 MG tablet Take 20 mg by mouth once a week. Thursday   rosuvastatin (CRESTOR) 20 MG tablet Take 1 tablet (20 mg total) by mouth once a week. Thursday   No facility-administered encounter medications on file as of 10/25/2021.    Follow-up: Return in about 3 months (around 01/25/2022).   Dewaine Conger  Cabe Lashley, FNP

## 2021-10-26 ENCOUNTER — Encounter: Payer: Self-pay | Admitting: Nurse Practitioner

## 2021-10-26 DIAGNOSIS — R109 Unspecified abdominal pain: Secondary | ICD-10-CM | POA: Insufficient documentation

## 2021-10-26 NOTE — Assessment & Plan Note (Addendum)
History of CVA status post craniectomy takes aspirin 325 mg tablet daily ?Continue Crestor 20 mg daily.  Check lipid panel at next visit ?Lab Results  ?Component Value Date  ? CHOL 215 (H) 09/25/2021  ? HDL 72 10/03/2020  ? [place] 94 10/03/2020  ? TRIG 138 10/03/2020  ? CHOLHDL 2.6 10/03/2020  ? ?

## 2021-10-26 NOTE — Assessment & Plan Note (Signed)
Most likely due to lymphoma ?Take Vicodin 5 325 mg tablet every 6 hours as needed for pain ?Patient denies nausea vomiting constipation.  Patient told to take MiraLAX as needed if she develops constipation she  verbalized understanding ?

## 2021-10-26 NOTE — Assessment & Plan Note (Addendum)
Chronic condition well-controlled patient alert and oriented during today's visit ? on Aricept 5 mg daily, Namenda 5 mg daily ?

## 2021-10-26 NOTE — Assessment & Plan Note (Signed)
Chronic condition ?Start trazodone 25 mg to 50 mg as needed at bedtime for insomnia ?Takes Vicodin 5-325 mg 1 tablet every 6 hours as needed for pain.  I told the patient that Vicodin can patient drowsy and sleepy take trazodone only when needed to avoid drowsiness.  He verbalized understanding. ?

## 2021-10-26 NOTE — Assessment & Plan Note (Addendum)
BP Readings from Last 3 Encounters:  ?10/25/21 115/72  ?10/24/21 111/84  ?10/18/21 134/67  ?Condition well-controlled, continue hydrochlorothiazide 12.5 mg daily, atenolol 50 mg daily ?No labs today ? ?Lab Results  ?Component Value Date  ? NA 128 (L) 10/08/2021  ? K 3.7 10/08/2021  ? CO2 24 10/08/2021  ? GLUCOSE 88 10/08/2021  ? BUN 16 10/08/2021  ? CREATININE 1.00 10/08/2021  ? CALCIUM 9.2 10/08/2021  ? GFRNONAA 58 (L) 10/08/2021  ? ?

## 2021-10-26 NOTE — Assessment & Plan Note (Signed)
pt newly diagnosed with lymphoma, starts chemotherapy next week , followed by Dr Delton Coombes.  Patient states that she has had thoracentesis for right pleural effusion.  Takes allopurinol 300 mg daily for TLS prophylaxis.  Takes hydrocodone acetaminophen 5-325 mg tablet every 6 hours as needed for moderate to severe pain.  Patient encouraged to maintain close follow-up with oncology she verbalized understanding ?

## 2021-10-29 ENCOUNTER — Encounter (HOSPITAL_COMMUNITY): Payer: Self-pay | Admitting: Hematology

## 2021-10-29 ENCOUNTER — Inpatient Hospital Stay (HOSPITAL_COMMUNITY): Payer: Medicare PPO | Attending: Hematology

## 2021-10-29 ENCOUNTER — Encounter (HOSPITAL_COMMUNITY): Payer: Self-pay

## 2021-10-29 ENCOUNTER — Other Ambulatory Visit: Payer: Self-pay

## 2021-10-29 DIAGNOSIS — C8338 Diffuse large B-cell lymphoma, lymph nodes of multiple sites: Secondary | ICD-10-CM

## 2021-10-29 DIAGNOSIS — R61 Generalized hyperhidrosis: Secondary | ICD-10-CM | POA: Insufficient documentation

## 2021-10-29 DIAGNOSIS — R350 Frequency of micturition: Secondary | ICD-10-CM | POA: Insufficient documentation

## 2021-10-29 DIAGNOSIS — K5900 Constipation, unspecified: Secondary | ICD-10-CM | POA: Insufficient documentation

## 2021-10-29 DIAGNOSIS — E860 Dehydration: Secondary | ICD-10-CM | POA: Insufficient documentation

## 2021-10-29 DIAGNOSIS — Z79899 Other long term (current) drug therapy: Secondary | ICD-10-CM | POA: Insufficient documentation

## 2021-10-29 DIAGNOSIS — Z5111 Encounter for antineoplastic chemotherapy: Secondary | ICD-10-CM | POA: Insufficient documentation

## 2021-10-29 DIAGNOSIS — Z95828 Presence of other vascular implants and grafts: Secondary | ICD-10-CM | POA: Insufficient documentation

## 2021-10-29 DIAGNOSIS — Z5189 Encounter for other specified aftercare: Secondary | ICD-10-CM | POA: Insufficient documentation

## 2021-10-29 DIAGNOSIS — Z5112 Encounter for antineoplastic immunotherapy: Secondary | ICD-10-CM | POA: Insufficient documentation

## 2021-10-29 DIAGNOSIS — C8330 Diffuse large B-cell lymphoma, unspecified site: Secondary | ICD-10-CM | POA: Insufficient documentation

## 2021-10-29 HISTORY — DX: Presence of other vascular implants and grafts: Z95.828

## 2021-10-29 MED ORDER — PREDNISONE 20 MG PO TABS: ORAL_TABLET | ORAL | 0 refills | Status: DC

## 2021-10-29 MED ORDER — LIDOCAINE-PRILOCAINE 2.5-2.5 % EX CREA: TOPICAL_CREAM | CUTANEOUS | 3 refills | Status: DC

## 2021-10-29 MED ORDER — PROCHLORPERAZINE MALEATE 10 MG PO TABS: 10.0000 mg | ORAL_TABLET | Freq: Four times a day (QID) | ORAL | 6 refills | Status: DC | PRN

## 2021-10-29 NOTE — Progress Notes (Signed)

## 2021-10-29 NOTE — Patient Instructions (Addendum)
[facility] Chemotherapy Teaching   You are diagnosed with Stage IV diffuse large B-cell lymphoma.  You will be treated in the clinic every 3 weeks (for 6 cycles) with a combination of chemotherapy drugs.  The intent of treatment is cure.  You will see the doctor regularly throughout treatment.  We will obtain blood work from you prior to every treatment and monitor your results to make sure it is safe to give your treatment. The doctor monitors your response to treatment by the way you are feeling, your blood work, and by obtaining scans periodically.  There will be wait times while you are here for treatment.  It will take about 30 minutes to 1 hour for your lab work to result.  Then there will be wait times while pharmacy mixes your medications.    R-CHOP is the acronym for the combination of chemotherapy drugs and other drugs you will receive for your treatment.  The drugs this regimen includes are:  Rituxan (rituximab); Cytoxan (cyclophosphamide); Oncovin (vincristine); Adriamycin (doxorubicin HCl); and prednisone (not a chemo drug).  Medications you will receive in the clinic prior to your chemotherapy medications:  Aloxi:  ALOXI is used in adults to help prevent nausea and vomiting that happens with certain chemotherapy drugs.  Aloxi is a long acting medication, and will remain in your system for about two days.   Emend:  This is an anti-nausea medication that is used with Aloxi to help prevent nausea and vomiting caused by chemotherapy.  Dexamethasone:  This is a steroid given prior to chemotherapy to help prevent allergic reactions; it may also help prevent and control nausea and diarrhea.   Tylenol:  Given to prevent infusion reactions to rituximab.  Benadryl:  Antihistamine given to prevent allergic/infusion reactions to rituximab.    Doxorubicin (Generic Name) Other Names: Adriamycin, hydroxyl daunorubicin  About This Drug  Doxorubicin is a drug used to treat  cancer. This drug is given in the vein (IV).  This is an IV push that will take about 5-10 minutes.  Possible Side Effects (More Common)   Bone marrow depression. This is a decrease in the number of white blood cells, red blood cells, and platelets. This may raise your risk of infection, make you tired and weak (fatigue), and raise your risk of bleeding.   Hair loss: Hair loss is often complete scalp hair loss and can involve loss of eyebrows, eyelashes, and pubic hair. You may notice this a few days or weeks after treatment has started. Most often hair loss is temporary; your hair should grow back when treatment is done.   Nausea and throwing up (vomiting). These symptoms may happen within a few hours after your treatment and may last up to 24 hours. Medicines are available to stop or lessen these side effects.   Soreness of the mouth and throat. You may have red areas, white patches, or sores that hurt.   Change in the color of your urine to pink or red. This color change will go away in one to two days.   Effects on the heart: This drug can weaken the heart and lower heart function. Your heart function will be checked as needed. You may have trouble catching your breath, mainly during activities.  You may also have trouble breathing while lying down, and have swelling in your ankles.   Sensitivity to light (photosensitivity). Photosensitivity means that you may become more sensitive to the effects of the sun, sun lamps, and tanning  beds. Your eyes may water more, mostly in bright light.   Metallic taste in the mouth: This may change the taste of food and drinks   Decreased appetite (decreased hunger)   Darkening of the skin or nails   Weakness that interferes with your daily activities   Possible Side Effects (Less Common)   Skin and tissue irritation may involve redness, pain, warmth, or swelling at the IV site. This happens if the drug leaks out of the vein and into nearby  tissue.   Changes in your liver function. Your doctor will check your liver function as needed.   This drug may cause an increased risk of developing a second cancer  Allergic Reaction  Serious allergic reactions, including anaphylaxis are rare. While you are getting this drug in your vein (IV), tell your nurse right away if you have any of these symptoms of an allergic reaction:   Trouble catching your breath   Feeling like your tongue or throat are swelling   Feeling your heart beat quickly or in a not normal way (palpitations)   Feeling dizzy or lightheaded   Flushing, itching, rash, and/or hives   Treating Side Effects   Drink 6-8 cups of fluids every day unless your doctor has told you to limit your fluid intake due to some other health problem. A cup is 8 ounces of fluid. If you vomit or have diarrhea, you should drink more fluids so that you do not become dehydrated (lack water in the body due to losing too much fluid).   Ask your doctor or nurse about medicine that is available to help stop or lessen nausea, throwing up, and/or loose bowel movements   Wear dark sunglasses and use sunscreen with SPF 30 or higher when you are outdoors even for a short time. Cover up when you are out in the sun. Wear wide-brimmed hats, long-sleeved shirts, and pants. Keep your neck, chest, and back covered.   Mouth care is very important. Your mouth care should consist of routine, gentle cleaning of your teeth or dentures and rinsing your mouth with a mixture of 1/2 teaspoon of salt in 8 ounces of water or  teaspoon of baking soda in 8 ounces of water. This should be done at least after each meal and at bedtime.   If you have mouth sores, avoid mouthwash that has alcohol. Avoid alcohol and smoking because they can bother your mouth and throat.   Talk with your nurse about getting a wig before you lose your hair. Also, call the [place] at 800-ACS-2345 to find out information  about the Look Good, Feel Better program close to where you live. It is a free program where women getting chemotherapy can learn about wigs, turbans and scarves as well as makeup techniques and skin and nail care.   While you are getting this drug, please tell your nurse right away if you have any pain, redness, or swelling at the site of the IV infusion.   Food and Drug Interactions  There are no known interactions of doxorubicin with food. This drug may interact with other medicines. Tell your doctor and pharmacist about all the medicines and dietary supplements (vitamins, minerals, herbs and others) that you are taking at this time. The safety and use of dietary supplements and alternative diets are often not known. Using these might affect your cancer or interfere with your treatment. Until more is known, you should not use dietary supplements or alternative diets without  your cancer doctor's help.   When to Call the Doctor  Call your doctor or nurse right away if you have any of these symptoms:   Fever of 100.4 F (38 C) or above   Chills   Easy bruising or bleeding   Wheezing or trouble breathing   Rash or itching   Feeling dizzy or lightheaded   Feeling that your heart is beating in a fast or not normal way (palpitations)   Loose bowel movements (diarrhea) more than 4 times a day or diarrhea with weakness or feeling lightheaded   Nausea that stops you from eating or drinking   Throwing up/vomiting   Signs of liver problems: dark urine, pale bowel movements, bad stomach pain, feeling very tired and weak, unusual itching, or yellowing of the eyes or skin,   During the IV infusion, if you have pain, redness, or swelling at the site of the IV infusion, please tell your nurse right away   Call your doctor or nurse as soon as possible if any of these symptoms happen:   Decreased urine   Pain in your mouth or throat that makes it hard to eat or drink   Nausea and  throwing up that is not relieved by prescribed medicines   Rash that is not relieved by prescribed medicines   Swelling of legs, ankles, or feet   Weight gain of 5 pounds in one week (fluid retention)   Lasting loss of appetite or rapid weight loss of five pounds in a week   Fatigue that interferes with your daily activities   Extreme weakness that interferes with normal activities   Sexual Problems and Reproduction Concerns   Infertility warning: Sexual problems and reproduction concerns may happen. In both men and women, this drug may affect your ability to have children. This cannot be determined before your treatment. Talk with your doctor or nurse if you plan to have children. Ask for information on sperm or egg banking.   In men, this drug may interfere with your ability to make sperm, but it should not change your ability to have sexual relations.   In women, menstrual bleeding may become irregular or stop while you are getting this drug. Do not assume that you cannot become pregnant if you do not have a menstrual period.   Women may go through signs of menopause (change of life) like vaginal dryness or itching. Vaginal lubricants can be used to lessen vaginal dryness, itching, and pain during sexual relations.   Genetic counseling is available for you to talk about the effects of this drug therapy on future pregnancies. Also, a genetic counselor can look at the possible risk of problems in the unborn baby due to this medicine if an exposure happens during pregnancy.   Pregnancy warning: This drug may have harmful effects on the unborn baby, so effective methods of birth control should be used by you and your partner during your cancer treatment and for at least 6 months after treatment   Breast feeding warning: Women should not breast feed during treatment because this drug could enter the breast milk and badly harm a breast feeding baby.   Vincristine sulfate (Generic  Name) Other Names: Oncovin, Vincasar, VCR  About This Drug  Vincristine is a drug used to treat cancer. It is given in the vein (IV).  This drug will take 10 minutes to infuse.   Possible Side Effects (More Common)   Constipation (not able to move bowels)  Hair loss. You may notice hair getting thin. Some patients lose their hair. Your hair often grows back when treatment is done. Most often hair loss is temporary; your hair should grow back when treatment is done.   Effects on the nerves are called peripheral neuropathy. You may feel numbness, tingling, or pain in your hands and feet. It may be hard for you to button your clothes, open jars, or walk as usual. The effect on the nerves may get worse with more doses of the drug. These effects get better in some people after the drug is stopped but it does not get better in all people.   Possible Side Effects (Less Common)   Soreness of the mouth and throat. You may have red areas, white patches, or sores that hurt.   Swelling of your legs, ankles, and/or feet   Skin and tissue irritation may involve redness, pain, warmth, or swelling at the IV site. This happens if the drug leaks out of the vein and into nearby tissue.   Blood pressure changes. Some patients have low blood pressure and some patients have high blood pressure.   Jaw pain   Hoarseness   Blurred or double vision or other changes in eyesight may happen but are a rare side effect.   Feeling dizzy or lightheaded   Drooping eyelids are a rare side effect.   Depression   Rash   Bone marrow depression. This is a decrease in the number of white blood cells, red blood cells, and platelets. This may raise your risk of infection, make you tired and weak (fatigue), and raise your risk of bleeding.   Chest pain or symptoms of a heart attack. Most heart attacks involve pain in the center of the chest that lasts more than a few minutes. The pain may go away and come back  or it can be constant. It can feel like pressure, squeezing, fullness, or pain. Sometimes pain is felt in one or both arms, the back, neck, jaw, or stomach. If any of these symptoms last 2 minutes, call 911.   Trouble sleeping   Lack of appetite; weight loss   Allergic Reactions  Serious allergic reactions including anaphylaxis are rare. While you are getting this drug in your vein (IV), tell your nurse right away if you have any of these symptoms of an allergic reaction:   Trouble catching your breath   Feeling like your tongue or throat are swelling   Feeling your heart beat quickly or in a not normal way (palpitations)   Feeling dizzy or lightheaded   Flushing, itching, rash, and/or hives   Treating Side Effects   While you are getting this drug in your IV, please tell your nurse right away if you have any pain, redness, or swelling at the site of the IV infusion.   Ask your doctor or nurse how to prevent or lessen constipation. Constipation can become a serious side effect. If you are constipated, check with your doctor or nurse before you use enemas, laxatives, or suppositories.   Drink 6-8 cups of fluids each day unless your doctor has told you to limit your fluid intake due to some other health problem. A cup is 8 ounces of fluid. If you throw up or have loose bowel movements, you should drink more fluids so that you do not become dehydrated (lack water in the body from losing too much fluid).   Mouth care is very important. Your mouth care should consist of  gently cleaning your teeth or dentures and rinsing your mouth with a mixture of  teaspoon salt in 8 ounces of water or  teaspoon sodium bicarbonate (baking soda) in 8 ounces of water. This should be done at least after each meal and at bedtime.   If you have mouth sores, avoid mouthwash that has alcohol. Also avoid alcohol and smoking because they can bother your mouth and throat.   If you get a rash do not put anything  on it unless your doctor or nurse says you may. Keep the area around the rash clean and dry. Ask your doctor for medicine if your rash bothers you.   If you have numbness and tingling in your hands and feet, be careful when cooking, walking, and handling sharp objects and hot liquids.   Talk with your nurse about getting a wig before you lose your hair. Also, call the [place] at 800-ACS-2345 to find out information about the Look Good, Feel Better program close to where you live. It is a free program where women getting chemotherapy can learn about wigs, turbans and scarves as well as makeup techniques and skin and nail care.   Food and Drug Interactions   There are known interactions of Vincristine with grapefruit. Do not eat grapefruit or drink grapefruit juice while taking this drug.   Check with your doctor or pharmacist about all other prescription medicines and dietary supplements you are taking before starting this medicine as there are a lot of known drug interactions with Vincristine. Also, check with your doctor or pharmacist before starting any new prescription, over-the-counter medicines, or dietary supplement to make sure that there are no interactions.   When to Call the Doctor  Call your doctor or nurse right away if you have any of these symptoms:   Redness, pain, warmth, or swelling at the IV site while you are getting this drug   No bowel movement in 3 days or when you feel uncomfortable.   Abdominal pain   Fever of 100.4 F (38 C) or higher   Chills   Feeling short of breath   Easy bleeding or bruising   Jaw pain   Hoarseness   Drooping eyelids   Blurred vision or other changes in eyesight   Feeling confused, restless, or irritable   Seizures (Common symptoms of a seizure can include confusion, blacking out, passing out, loss of hearing or vision, blurred vision, unusual smells or tastes (such as burning rubber), trouble talking, tremors  or shaking in parts or all of the body, repeated body movements, tense muscles that do not relax, and loss of control of urine and bowels. There are other less common symptoms of seizures. If you or your family member suspects you are having a seizure, call 911 right away).   Hallucinations (seeing, hearing, or feeling things that are not really there)   Feeling   Rash   Chest pain or symptoms of a heart attack. Most heart attacks involve pain in the center of the chest that lasts more than a few minutes. The pain may go away and come back. It can feel like pressure, squeezing, fullness, or pain. Sometimes pain is felt in one or both arms, the back, neck, jaw, or stomach. If any of these symptoms last 2 minutes, call 911.  Call your doctor or nurse as soon as possible if you have any of these symptoms:   Pain in fingers, toes, joints, or testicles   Numbness, tingling,  or decreased feeling or weakness in fingers, toes, arms, or legs   Trouble walking or changes in the way you walk   Swelling of your legs, ankles, and/or feet   Headache   Pain in your mouth or throat that makes it hard to eat or drink   Loss of appetite or weight loss   Hearing changes   Depression   Trouble sleeping   Reproduction Concerns   Women may go through signs of menopause (change of life) like vaginal dryness or itching. Vaginal lubricants can be used to lessen vaginal dryness, itching, and pain during sexual relations.   Pregnancy warning: This drug may have harmful effects on the unborn child, so effective methods of birth control should be used during your cancer treatment.   Breast feeding warning: It is not known if this drug passes into breast milk. For this reason, women should talk to their doctor about the risks and benefits of breast feeding during treatment with this drug because this drug may enter the breast milk and badly harm a breast feeding baby.   Cyclophosphamide (Generic  Name) Other Names: Cytoxan, Neosar  About This Drug  Cyclophosphamide is a drug used to treat cancer. It is given in the vein (IV) or by mouth. This drug takes 30 minutes to infuse.  Possible Side Effects (More Common)   Nausea and throwing up (vomiting). These symptoms may happen within a few hours after your treatment and may last up to 72 hours. Medicines are available to stop or lessen these side effects.   Bone marrow depression. This is a decrease in the number of white blood cells, red blood cells, and platelets. This may raise your risk of infection, make you tired and weak (fatigue), and raise your risk of bleeding.   Hair loss: You may notice hair getting thin. Some patients lose their hair. Hair loss is often complete scalp hair loss and can involve loss of eyebrows, eyelashes, and pubic hair. You may notice this a few days or weeks after treatment has started. Most often hair loss is temporary; your hair should grow back when treatment is done.   Decreased appetite (decreased hunger)   Blurred vision   Soreness of the mouth and throat. You may have red areas, white patches, or sores that hurt.   Effects on the bladder. This drug may cause irritation and bleeding in the bladder. You may have blood in your urine. To help stop this, you will get extra fluids to help you pass more urine. You may get a drug called mesna, which helps to decrease irritation and bleeding. You may also get a medicine to help you pass more urine. You may have a catheter (tube) placed in your bladder so that your bladder will be washed with this drug.  Possible Side Effects (Less Common)   Darkening of the skin or nails   Metallic taste in the mouth   Changes in lung tissue may happen with large amounts of this drug. These changes may not last forever, and your lung tissue may go back to normal. Sometimes these changes may not be seen for many years. You may get a cough or have trouble catching your  breath.   Allergic Reactions  Serious allergic reactions including anaphylaxis are rare. While you are getting this drug in your vein (IV), tell your nurse right away if you have any of these symptoms of an allergic reaction:   Trouble catching your breath   Feeling like  your tongue or throat are swelling   Feeling your heart beat quickly or in a not normal way (palpitations)   Feeling dizzy or lightheaded   Flushing, itching, rash, and/or hives   Treating Side Effects   Drink 6-8 cups of fluids each day unless your doctor has told you to limit your fluid intake due to some other health problem. A cup is 8 ounces of fluid. If you throw up or have loose bowel movements you should drink more fluids so that you do not become dehydrated (lack water in the body due to losing too much fluid).   Ask your doctor or nurse about medicine that is available to help stop or lessen nausea or throwing up.   Mouth care is very important. Your mouth care should consist of routine, gentle cleaning of your teeth or dentures and rinsing your mouth with a mixture of 1/2 teaspoon of salt in 8 ounces of water or  teaspoon of baking soda in 8 ounces of water. This should be done at least after each meal and at bedtime.   If you have mouth sores, avoid mouthwash that has alcohol. Also avoid alcohol and smoking because they can bother your mouth and throat.   Talk with your nurse about getting a wig before you lose your hair. Also, call the [place] at 800-ACS-2345 to find out information about the  [employer] program close to where you live. It is a free program where women undergoing chemotherapy learn about wigs, turbans and scarves as well as makeup techniques and skin and nail care.  Important Information   Whenever you tell a doctor or nurse your health history, always tell them that you have received cyclophosphamide in the past.   If you take this drug by mouth swallow  the medicine whole. Do not chew, break or crush it.   You can take the medicine with or without food. If you have nausea, take it with food. Do not take the pills at bedtime.  Food and Drug Interactions  There are no known interactions of cyclophosphamide with food. This drug may interact with other medicines. Tell your doctor and pharmacist about all the medicines and dietary supplements (vitamins, minerals, herbs and others) that you are taking at this time. The safety and use of dietary supplements and alternative diets are often not known. Using these might affect your cancer or interfere with your treatment. Until more is known, you should not use dietary supplements or alternative diets without your cancer doctor's help.   When to Call the Doctor  Call your doctor or nurse right away if you have any of these symptoms:   Fever of 100.4 F (38 C) or higher   Chills   Bleeding or bruising that is not normal   Blurred vision or other changes in eyesight   Pain when passing urine; blood in urine   Pain in your lower back or side   Wheezing or trouble breathing   Swelling of legs, ankles, or feet   Feeling dizzy or lightheaded   Feeling confused or agitated   Signs of liver problems: dark urine, pale bowel movements, bad stomach pain, feeling very tired and weak, unusual itching, or yellowing of the eyes or skin   Unusual thirst or passing urine often   Nausea that stops you from eating or drinking   Throwing up/vomiting  Call your doctor or nurse as soon as possible if any of these symptoms happen:  Pain in your mouth or throat that makes it hard to eat or drink   Nausea not relieved by prescribed medicines   Sexual Problems and Reproductive Concerns   Infertility warning: Sexual problems and reproduction concerns may happen. In both men and women, this drug may affect your ability to have children. This cannot be determined before your treatment. Talk with your  doctor or nurse if you plan to have children. Ask for information on sperm or egg banking.   In men, this drug may interfere with your ability to make sperm, but it should not change your ability to have sexual relations.   In women, menstrual bleeding may become irregular or stop while you are getting this drug. Do not assume that you cannot become pregnant if you do not have a menstrual period.   Women may go through signs of menopause (change of life) like vaginal dryness or itching. Vaginal lubricants can be used to lessen vaginal dryness, itching, and pain during sexual relations.   Genetic counseling is available for you to talk about the effects of this drug therapy on future pregnancies. Also, a genetic counselor can look at the possible risk of problems in the unborn baby due to this medicine if an exposure happens during pregnancy.   Pregnancy warning: This drug may have harmful effects on the unborn child, so effective methods of birth control should be used during your cancer treatment.   Breast feeding warning: Women should not breast feed during treatment because this drug could enter the breast milk and badly harm a breast feeding baby.   Rituximab (Generic Name) Other Name: Rituxan  About This Drug  Rituximab is a monoclonal antibody used to treat cancer. This drug is given in the vein (IV).  This first time this is given it will be infused slower to monitor for infusion reactions.    Possible Side Effects (More Common)   Bone marrow depression. This is a decrease in the number of white blood cells, red blood cells, and platelets. This  may raise your risk of infection, make you tired and weak (fatigue), and raise your risk of bleeding.   Rash-skin irritation, redness or itching (dermatitis)   Flu-like symptoms: fever, headache, muscle and joint aches, and fatigue (low energy, feeling weak)   Infusion-related reactions   Hepatitis B - if you have ever had hepatitis  B, the virus may come back during treatment with this drug. Your doctor will test to see if you have ever had hepatitis B prior to your treatment.   Changes in your central nervous system can happen. The central nervous system is made up of your brain and spinal cord. You could feel: extreme tiredness, agitation, confusion, or have: hallucinations (see or hear things that are not there), trouble understanding or speaking, loss of control of your bowels or bladder, eyesight changes, numbness or lack of strength to your arms, legs, face, or body, seizures or coma. If you start to have any of these symptoms let your doctor know right away.   Tumor lysis: This drug may act on the cancer cells very quickly. This may affect how your kidneys work. Your doctor will monitor your kidney function.   Changes in your liver function. Your doctor will check your liver function as needed.   Nausea and throwing up (vomiting): these symptoms may happen within a few hours after your treatment and may last up to 24 hours. Medicines are available to stop or lessen these side effects.  Loose bowel movements (diarrhea) that may last for a few days   Abdominal pain   Infections   Cough, runny nose   Swelling of your legs, ankles and/or feet or hands   High blood pressure. Your doctor will check your blood pressure as needed.   Abnormal heart beat  Possible Side Effects (Less Common)   Shortness of breath   Soreness of the mouth and throat. You may have red areas, white patches, or sores that hurt.  Infusion Reactions  Infusion Reactions are the most common side effect linked to use of this drug and can be quite severe. Medicines will be given before you get the drug to lower the severity of this side effect. The infusion reactions are the worse with the first dose of the drug and become less severe with more doses of the drug. While you are getting this drug in your vein (IV), tell your nurse right away if  you have any of these symptoms of an allergic reaction:   Trouble catching your breath   Feeling like your tongue or throat are swelling   Feeling your heart beat quickly or in a not normal way (palpitations)   Feeling dizzy or lightheaded   Flushing, itching, rash, and/or hives   Treating Side Effects   Ask your doctor or nurse about medicine to stop or lessen headache, loose bowel movements (diarrhea), constipation, nausea, throwing up (vomiting), or pain.   If you get a rash do not put anything it unless your doctor or nurse says you may. Keep the area around the rash clean and dry. Ask your doctor for medicine if the rash bothers you.   Drink 6-8 cups of fluids each day unless your doctor has told you to limit your fluid intake due to some other health problem. A cup is 8 ounces of fluid. If you throw up or have loose bowel movements, you should drink more fluids so that you do not become dehydrated (lack of water in the body from losing too much fluid).   If you are not able to move your bowels, check with your doctor or nurse before you use enemas, laxatives, or suppositories   If you have mouth sores, avoid mouthwash that has alcohol. Also avoid alcohol and smoking because they can bother your mouth and throat.   If you have a nose bleed, sit with your head tipped slightly forward. Apply pressure by lightly pinching the bridge of your nose between your thumb and forefinger. Call your doctor if you feel dizzy or faint or if the bleeding doesn't stop after 10 to 15 minutes   Important Information   After treatment with this drug, vaccination with live viruses should be delayed until the immune system recovers.   Symptoms of abnormal bleeding may be: coughing up blood, throwing up blood (may look like coffee grounds), red or black, tarry bowel movements, blood in urine, abnormally heavy menstrual flow, nosebleeds, or any unusual bleeding.   Symptoms of high blood pressure may  be: headache, blurred vision, confusion, chest pain, or a feeling that your heart is beat differently.   Urinary tract infection. Symptoms may include:   Pain or burning when you pass urine   Feeling like you have to pass urine often, but not much comes out when you do.   Tender or heavy feeling in your lower abdomen   Cloudy urine and/or urine that smells bad.   Pain on one side of your back under your ribs.  This is where your kidneys are.   Fever, chills, nausea and/or throwing up   Food and Drug Interactions  There are no known interactions of rituximab and any food. This drug may interact with other medicines. Tell your doctor and pharmacist about all the medicines and dietary supplements (vitamins, minerals, herbs and others) that you are taking at this time. The safety and use of dietary supplements and alternative diets are often not known. Using these might affect your cancer or interfere with your treatment. Until more is known, you should not use dietary supplements or alternative diets without your cancer doctor's help.   When to Call the Doctor  Call your doctor or nurse right away if you have any of these symptoms:   Fever of 100.4 F (38 C) higher   Chills   Trouble breathing   Rash with or without itching   Blistering or peeling of skin   Chest pain or symptoms of a heart attack. Most heart attacks involve pain in the center of the chest that lasts more than a few minutes. The pain may go away and come back or it can be constant. It can feel like pressure, squeezing, fullness, or pain. Sometimes pain is felt in one or both arms, the back, neck, jaw, or stomach. If any of these symptoms last 2 minutes, call 911   Easy bleeding or bruising   Blood in urine or bowel movements   Feeling that your heart is beating in a fast or not normal way (palpitations)   Nausea that stops you from eating or drinking   Throwing up/vomiting   Abdominal pain   Loose bowel  movements (diarrhea) 4 times in one day or diarrhea with weakness or lightheadedness   No bowel movement in 3 days or if you feel uncomfortable   Feeling dizzy or lightheaded   Changes in your speech or vision   Feeling confused   Weakness of your arms and legs or poor coordination (feeling clumsy)   Signs of liver problems: dark urine, pale bowel movements, bad stomach pain, feeling very tired and weak, unusual itching, or yellowing of skin or eyes   Symptoms of a urinary tract infection (see important information)   Call your doctor or nurse as soon as possible if you have any of these symptoms:   Swelling of your legs, ankles and/or feet   Fatigue and /or weakness that interferes with your daily activities   Joint and muscle pain or muscle spasms that are not relieved by prescribed medicines   Cough that lasts longer than normal   Reproduction Concerns   Pregnancy warning: This drug is known to cross the placenta. This drug may have harmful effects on an unborn baby. Effective methods of birth control should be used during treatment with this drug and for 12 months after the last treatment. If exposure occurs to an unborn baby, the baby's immune system may be affected, which could last for months after birth. Until the immune system recovers, live vaccines should not be administered to the baby. Be sure to talk with your doctor if you are pregnant or planning to become pregnant while getting this drug.   Breast feeding warning: It is not known if rituximab is passed into human breast milk. In animal studies, this drug was detected in in breast milk. For this reason, women should talk to their doctor about the risks and benefits of breast feeding during treatment with this drug because this drug may enter  the breast milk and badly harm a breast feeding baby.   Prednisone  About This Drug  Prednisone is a steroid that may be used to treat cancer. It is given orally (by mouth).   You will take this medication (100 mg daily with food) on Days 1-5.  Day 1 is each day you come to the clinic to get chemotherapy treatment.  You will start the prednisone on that day, and continue to take daily for the four days after, to total 5 days.  You will do this with each cycle of chemotherapy.  You do not take this medication on the weeks you are not getting treatment.   Possible Side Effects   Abnormal heart beat   Headache   High blood pressure   Increased sweating   Blurred vision or other changes in eyesight   Tiredness and weakness   Changes in mood, which may include depression or a feeling of extreme well-being   Trouble sleeping   Feeling restless (unable to relax)   Skin changes such as rash, dryness, or redness   Increased appetite (increased hunger)   Weight gain   Electrolyte changes   Increased risk of infections   Aggravation of stomach ulcers   Pain in your abdomen   Nausea   Blood sugar levels may change   Swelling of your legs, ankles and/or feet   Muscle loss and/or weakness (lack of muscle strength)   Increased risk of developing osteoporosis- your bones may become weak and brittle   Increased risk for cataracts, glaucoma or infections of the eye   Changes in your liver function  Note: Not all possible side effects are included above.  Warnings and Precautions   High blood pressure and changes in electrolytes, which can cause fluid build-up around your heart, lungs or elsewhere.   Severe infections, which can be life-threatening.   Allergic reactions, including anaphylaxis are rare but may happen in some patients. Signs of allergic reaction to this drug may be swelling of the face, feeling like your tongue or throat are swelling, trouble breathing, rash, itching, fever, chills, feeling dizzy, and/or feeling that your heart is beating in a fast or not normal way. If this happens, do not take another dose of this drug. You should  get urgent medical treatment.   Increased risk of developing a hole in your stomach, small, and/or large intestine if you have ulcers in the lining of your stomach and/or intestine, or have diverticulitis, ulcerative colitis and/or other diseases that affect the gastrointestinal tract.   Blurred vision or other changes in eyesight.   Severe depression and other psychiatric disorders such as mood changes.   Effects on the endocrine glands including pituitary, adrenals and thyroid during or after use of this medication.  Note: Some of the side effects above are very rare. If you have concerns and/or questions, please discuss them with your medical team.  Important Information   Talk to your doctor or your nurse before stopping this medication, it should be stopped gradually. Depending on the dose and length of treatment, you could experience serious side effects if stopped abruptly (suddenly).   Talk to your doctor before receiving any vaccinations during your treatment. Some vaccinations are not recommended while receiving prednisone.  How to Take Your Medication   For Oral (by mouth): You can take the medicine with or without food, but it is preferable to be taken with food, especially If you have nausea or upset stomach.  Missed dose: If you miss or vomit a dose, contact your physician. Do not take 2 doses at the same time and do not double up on the next dose.   Handling: Wash your hands after handling your medicine, your caretakers should not handle your medicine with bare hands and should wear latex gloves.   Storage: Store this medicine in the original container at room temperature, protect from moisture. Discuss with your nurse or your doctor how to dispose of unused medicine.   Treating Side Effects   Drink plenty of fluids (a minimum of eight glasses per day is recommended).   To help with nausea and vomiting, eat small, frequent meals instead of three large meals a day.  Choose foods and drinks that are at room temperature. Ask your nurse or doctor about other helpful tips and medicine that is available to help stop or lessen these symptoms.   If you throw up, you should drink more fluids so that you do not become dehydrated (lack of water in the body from losing too much fluid).   Manage tiredness by pacing your activities for the day. Be sure to include periods of rest between energy-draining activities.   To help with muscle weakness, get regular exercise. If you feel too tired to exercise vigorously, try taking a short walk.   If you are having trouble sleeping, talk to your nurse or doctor on tips to help you sleep better.   If you are feeling depressed, talk to your nurse or doctor about it.   Keeping your pain under control is important to your well-being. Please tell your doctor or nurse if you are experiencing pain.   If you have diabetes, keep good control of your blood sugar level. Tell your nurse or your doctor if your glucose levels are higher or lower than normal.   To decrease the risk of infection, wash your hands regularly.   Avoid close contact with people who have a cold, the flu, or other infections.   Take your temperature as your doctor or nurse tells you, and whenever you feel like you may have a fever.   If you get a rash do not put anything on it unless your doctor or nurse says you may. Keep the area around the rash clean and dry. Ask your doctor for medicine if your rash bothers you.   Moisturize your skin several times a day   Avoid sun exposure and apply sunscreen routinely when outdoors.   Food and Drug Interactions   This drug may interact with grapefruit and grapefruit juice. Talk to your doctor as this could make side effects worse.   Check with your doctor or pharmacist about all other prescription medicines and over-the-counter medicines and dietary supplements (vitamins, minerals, herbs and others) you are taking  before starting this medicine as there are known drug interactions with prednisone. Also, check with your doctor or pharmacist before starting any new prescription or over-the-counter medicines, or dietary supplements to make sure that there are no interactions.   There are known interactions of prednisone with other medicines and products like aspirin, and other nonsteroidal anti-inflammatory agents. Ask your doctor what over-the-counter (OTC) are safe to take while taking this medication.    There are known interactions of prednisone with blood thinning medicine such as warfarin. Ask your doctor what precautions you should take.   Avoid the use of Yaurel while taking prednisone as this may lower the levels of the drug in  your body, which can make it less effective.   When to Call the Doctor  Call your doctor or nurse if you have any of these symptoms and/or any new or unusual symptoms:   Fever of 100.4 F (38 C) or higher   Chills   A headache that does not go away   Blurry vision or other changes in eyesight   Feel irritable, nervous or restless   Trouble sleeping   Tiredness or weakness that interferes with your daily activities   Trouble breathing   Feeling that your heart is beating in a fast or not normal way (palpitations)   Chest pain or symptoms of a heart attack. Most heart attacks involve pain in the center of the chest that lasts more than a few minutes. The pain may go away and come back or it can be constant. It can feel like pressure, squeezing, fullness, or pain. Sometimes pain is felt in one or both arms, the back, neck, jaw, or stomach. If any of these symptoms last 2 minutes, call 911.   Nausea that stops you from eating or drinking and/or is not relieved by prescribed medicines   Throwing up   Severe abdominal pain that does not go away   Heartburn or indigestion   Abnormal blood sugar   Severe mood changes such as depression or unusual  thoughts and/or behaviors   Thoughts of hurting yourself or others, and suicide   Feeling hopeless on most days   Unusual thirst, passing urine often, headache, sweating, shakiness, irritability   Swelling of legs, ankles, or feet   Weight gain of 5 pounds in one week (fluid retention)   A new rash or a rash that is not relieved by prescribed medicines   Signs of possible liver problems: dark urine, pale bowel movements, bad stomach pain, feeling very tired and weak, unusual itching, or yellowing of the eyes or skin   Signs of allergic reaction: swelling of the face, feeling like your tongue or throat are swelling, trouble breathing, rash, itching, fever, chills, feeling dizzy, and/or feeling that your heart is beating in a fast or not normal way. If this happens, call 911 for emergency care.   If you think you may be pregnant  Reproduction Warnings   Pregnancy warning: It is not known if this drug may harm an unborn child. For this reason, be sure to talk with your doctor if you are pregnant or planning to become pregnant while receiving this drug. Let your doctor know right away if you think you may be pregnant.   Breastfeeding warning: It is not known if this drug passes into breast milk. For this reason, women should talk to their doctor about the risks and benefits of breastfeeding during treatment with this drug because this drug may enter the breast milk and cause harm to a breastfeeding baby.   Fertility warning: Human fertility studies have not been done with this drug. Talk with your doctor or nurse if you plan to have children. Ask for information on sperm or egg banking.  You will get Neulasta (or similar) after every treatment of chemo:  Neulasta (or similar) - this medication is not chemotherapy but is being given because you have had chemo. It is usually given 24-48 hours after the completion of chemotherapy. This medication works by boosting your bone marrow's supply of  white blood cells. White blood cells are what protect our bodies against infection. The medication is given in the form of  a subcutaneous injection. It is given in the fatty tissue of your abdomen, or in the skin on the back of your arm . It is a short needle. The major side effect of this medication is bone or muscle pain. The drug of choice to relieve or lessen the pain is Aleve or Ibuprofen. If a physician has ever told you not to take Aleve or Ibuprofen - then don't take it. You should then take Tylenol/acetaminophen. Take either medication as the bottle directs you to.  The level of pain you experience as a result of this injection can range from none, to mild or moderate, or severe. Please let us know if you develop moderate or severe bone pain.   You can take Claritin 10 mg over the counter for a few days after receiving Neulasta to help with the bone aches and pains.      SELF CARE ACTIVITIES WHILE RECEIVING CHEMOTHERAPY:  Hydration Increase your fluid intake 48 hours prior to treatment and drink at least 8 to 12 cups (64 ounces) of water/decaffeinated beverages per day after treatment. You can still have your cup of coffee or soda but these beverages do not count as part of your 8 to 12 cups that you need to drink daily. No alcohol intake.  Medications Continue taking your normal prescription medication as prescribed.  If you start any new herbal or new supplements please let us know first to make sure it is safe.  Mouth Care Have teeth cleaned professionally before starting treatment. Keep dentures and partial plates clean. Use soft toothbrush and do not use mouthwashes that contain alcohol. Biotene is a good mouthwash that is available at most pharmacies or may be ordered by calling [phone]. Use warm salt water gargles (1 teaspoon salt per 1 quart warm water) before and after meals and at bedtime. If you need dental work, please let the doctor know before you go for your appointment so  that we can coordinate the best possible time for you in regards to your chemo regimen. You need to also let your dentist know that you are actively taking chemo. We may need to do labs prior to your dental appointment.  Skin Care Always use sunscreen that has not expired and with SPF (Sun Protection Factor) of 50 or higher. Wear hats to protect your head from the sun. Remember to use sunscreen on your hands, ears, face, & feet.  Use good moisturizing lotions such as udder cream, eucerin, or even Vaseline. Some chemotherapies can cause dry skin, color changes in your skin and nails.    Avoid long, hot showers or baths. Use gentle, fragrance-free soaps and laundry detergent. Use moisturizers, preferably creams or ointments rather than lotions because the thicker consistency is better at preventing skin dehydration. Apply the cream or ointment within 15 minutes of showering. Reapply moisturizer at night, and moisturize your hands every time after you wash them.  Hair Loss (if your doctor says your hair will fall out)  If your doctor says that your hair is likely to fall out, decide before you begin chemo whether you want to wear a wig. You may want to shop before treatment to match your hair color. Hats, turbans, and scarves can also camouflage hair loss, although some people prefer to leave their heads uncovered. If you go bare-headed outdoors, be sure to use sunscreen on your scalp. Cut your hair short. It eases the inconvenience of shedding lots of hair, but it also can reduce the  emotional impact of watching your hair fall out. Don't perm or color your hair during chemotherapy. Those chemical treatments are already damaging to hair and can enhance hair loss. Once your chemo treatments are done and your hair has grown back, it's OK to resume dyeing or perming hair.  With chemotherapy, hair loss is almost always temporary. But when it grows back, it may be a different color or texture. In older  adults who still had hair color before chemotherapy, the new growth may be completely gray.  Often, new hair is very fine and soft.  Infection Prevention Please wash your hands for at least 30 seconds using warm soapy water. Handwashing is the #1 way to prevent the spread of germs. Stay away from sick people or people who are getting over a cold. If you develop respiratory systems such as green/yellow mucus production or productive cough or persistent cough let us know and we will see if you need an antibiotic. It is a good idea to keep a pair of gloves on when going into grocery stores/[employer] to decrease your risk of coming into contact with germs on the carts, etc. Carry alcohol hand gel with you at all times and use it frequently if out in public. If your temperature reaches 100.4 or higher please call the clinic and let us know.  If it is after hours or on the weekend please go to the ER if your temperature is over 100.4.  Please have your own personal thermometer at home to use.    Sex and bodily fluids If you are going to have sex, a condom must be used to protect the person that isn't taking chemotherapy. Chemo can decrease your libido (sex drive). For a few days after chemotherapy, chemotherapy can be excreted through your bodily fluids.  When using the toilet please close the lid and flush the toilet twice.  Do this for a few day after you have had chemotherapy.   Effects of chemotherapy on your sex life Some changes are simple and won't last long. They won't affect your sex life permanently.  Sometimes you may feel: too tired not strong enough to be very active sick or sore  not in the mood anxious or low  Your anxiety might not seem related to sex. For example, you may be worried about the cancer and how your treatment is going. Or you may be worried about money, or about how you family are coping with your illness. These things can cause stress, which can affect your interest in sex.  It's important to talk to your partner about how you feel. Remember - the changes to your sex life don't usually last long. There's usually no medical reason to stop having sex during chemo. The drugs won't have any long term physical effects on your performance or enjoyment of sex. Cancer can't be passed on to your partner during sex  Contraception It's important to use reliable contraception during treatment. Avoid getting pregnant while you or your partner are having chemotherapy. This is because the drugs may harm the baby. Sometimes chemotherapy drugs can leave a man or woman infertile.  This means you would not be able to have children in the future. You might want to talk to someone about permanent infertility. It can be very difficult to learn that you may no longer be able to have children. Some people find counselling helpful. There might be ways to preserve your fertility, although this is easier for men than for  women. You may want to speak to a fertility expert. You can talk about sperm banking or harvesting your eggs. You can also ask about other fertility options, such as donor eggs. If you have or have had breast cancer, your doctor might advise you not to take the contraceptive pill. This is because the hormones in it might affect the cancer.  It is not known for sure whether or not chemotherapy drugs can be passed on through semen or secretions from the vagina. Because of this some doctors advise people to use a barrier method if you have sex during treatment. This applies to vaginal, anal or oral sex. Generally, doctors advise a barrier method only for the time you are actually having the treatment and for about a week after your treatment. Advice like this can be worrying, but this does not mean that you have to avoid being intimate with your partner. You can still have close contact with your partner and continue to enjoy sex.  Animals If you have cats or birds we just ask that you  not change the litter or change the cage.  Please have someone else do this for you while you are on chemotherapy.   Food Safety During and After Cancer Treatment Food safety is important for people both during and after cancer treatment. Cancer and cancer treatments, such as chemotherapy, radiation therapy, and stem cell/bone marrow transplantation, often weaken the immune system. This makes it harder for your body to protect itself from foodborne illness, also called food poisoning. Foodborne illness is caused by eating food that contains harmful bacteria, parasites, or viruses.  Foods to avoid Some foods have a higher risk of becoming tainted with bacteria. These include: Unwashed fresh fruit and vegetables, especially leafy vegetables that can hide dirt and other contaminants Raw sprouts, such as alfalfa sprouts Raw or undercooked beef, especially ground beef, or other raw or undercooked meat and poultry Fatty, fried, or spicy foods immediately before or after treatment.  These can sit heavy on your stomach and make you feel nauseous. Raw or undercooked shellfish, such as oysters. Sushi and sashimi, which often contain raw fish.  Unpasteurized beverages, such as unpasteurized fruit juices, raw milk, raw yogurt, or cider Undercooked eggs, such as soft boiled, over easy, and poached; raw, unpasteurized eggs; or foods made with raw egg, such as homemade raw cookie dough and homemade mayonnaise  Simple steps for food safety  Shop smart. Do not buy food stored or displayed in an unclean area. Do not buy bruised or damaged fruits or vegetables. Do not buy cans that have cracks, dents, or bulges. Pick up foods that can spoil at the end of your shopping trip and store them in a cooler on the way home.  Prepare and clean up foods carefully. Rinse all fresh fruits and vegetables under running water, and dry them with a clean towel or paper towel. Clean the top of cans before opening them. After  preparing food, wash your hands for 20 seconds with hot water and soap. Pay special attention to areas between fingers and under nails. Clean your utensils and dishes with hot water and soap. Disinfect your kitchen and cutting boards using 1 teaspoon of liquid, unscented bleach mixed into 1 quart of water.    Dispose of old food. Eat canned and packaged food before its expiration date (the use by or best before date). Consume refrigerated leftovers within 3 to 4 days. After that time, throw out the food. Even if  the food does not smell or look spoiled, it still may be unsafe. Some bacteria, such as Listeria, can grow even on foods stored in the refrigerator if they are kept for too long.  Take precautions when eating out. At restaurants, avoid buffets and salad bars where food sits out for a long time and comes in contact with many people. Food can become contaminated when someone with a virus, often a norovirus, or another bug handles it. Put any leftover food in a to-go container yourself, rather than having the server do it. And, refrigerate leftovers as soon as you get home. Choose restaurants that are clean and that are willing to prepare your food as you order it cooked.   AT HOME MEDICATIONS:                                                                                                                                                                Compazine/Prochlorperazine '10mg'$  tablet. Take 1 tablet every 6 hours as needed for nausea/vomiting. (This can make you sleepy)   EMLA cream. Apply a quarter size amount to port site 1 hour prior to chemo. Do not rub in. Cover with plastic wrap.    Diarrhea Sheet   If you are having loose stools/diarrhea, please purchase Imodium and begin taking as outlined:  At the first sign of poorly formed or loose stools you should begin taking Imodium (loperamide) 2 mg capsules.  Take two tablets ('4mg'$ ) followed by one tablet ('2mg'$ ) every 2 hours  - DO NOT EXCEED 8 tablets in 24 hours.  If it is bedtime and you are having loose stools, take 2 tablets at bedtime, then 2 tablets every 4 hours until morning.   Always call the [facility] if you are having loose stools/diarrhea that you can't get under control.  Loose stools/diarrhea leads to dehydration (loss of water) in your body.  We have other options of trying to get the loose stools/diarrhea to stop but you must let us know!  Constipation Sheet  Colace - 100 mg capsules - take 2 capsules daily.  If this doesn't help then you can increase to 2 capsules twice daily.  Please call if the above does not work for you. Do not go more than 2 days without a bowel movement.  It is very important that you do not become constipated.  It will make you feel sick to your stomach (nausea) and can cause abdominal pain and vomiting.  Nausea Sheet   Compazine/Prochlorperazine '10mg'$  tablet. Take 1 tablet every 6 hours as needed for nausea/vomiting (This can make you drowsy).  If you are having persistent nausea (nausea that does not stop) please call the [facility] and let us know the amount of nausea that you are experiencing.  If you begin to vomit, you need to call the [facility] and if it is the weekend and you have vomited more than one time and can't get it to stop-go to the Emergency Room.  Persistent nausea/vomiting can lead to dehydration (loss of fluid in your body) and will make you feel very weak and unwell. Ice chips, sips of clear liquids, foods that are at room temperature, crackers, and toast tend to be better tolerated.    SYMPTOMS TO REPORT AS SOON AS POSSIBLE AFTER TREATMENT:  FEVER GREATER THAN 100.4 F  CHILLS WITH OR WITHOUT FEVER  NAUSEA AND VOMITING THAT IS NOT CONTROLLED WITH YOUR NAUSEA MEDICATION  UNUSUAL SHORTNESS OF BREATH  UNUSUAL BRUISING OR BLEEDING  TENDERNESS IN MOUTH AND THROAT WITH OR WITHOUT   PRESENCE OF ULCERS  URINARY PROBLEMS  BOWEL  PROBLEMS  UNUSUAL RASH    Wear comfortable clothing and clothing appropriate for easy access to any Portacath or PICC line. Let us know if there is anything that we can do to make your therapy better!   What to do if you need assistance after hours or on the weekends: CALL [phone].  HOLD on the line, do not hang up.  You will hear multiple messages but at the end you will be connected with a nurse triage line.  They will contact the doctor if necessary.  Most of the time they will be able to assist you.  Do not call the hospital operator.     I have been informed and understand all of the instructions given to me and have received a copy. I have been instructed to call the clinic [phone] or my family physician as soon as possible for continued medical care, if indicated. I do not have any more questions at this time but understand that I may call the [facility] or the Patient Navigator at [phone] during office hours should I have questions or need assistance in obtaining follow-up care.

## 2021-10-30 ENCOUNTER — Inpatient Hospital Stay (HOSPITAL_COMMUNITY): Payer: Medicare PPO

## 2021-10-30 VITALS — BP 105/58 | HR 60 | Temp 97.4°F | Resp 16

## 2021-10-30 DIAGNOSIS — Z5189 Encounter for other specified aftercare: Secondary | ICD-10-CM | POA: Diagnosis not present

## 2021-10-30 DIAGNOSIS — R61 Generalized hyperhidrosis: Secondary | ICD-10-CM | POA: Diagnosis not present

## 2021-10-30 DIAGNOSIS — Z79899 Other long term (current) drug therapy: Secondary | ICD-10-CM | POA: Diagnosis not present

## 2021-10-30 DIAGNOSIS — R350 Frequency of micturition: Secondary | ICD-10-CM | POA: Diagnosis not present

## 2021-10-30 DIAGNOSIS — Z95828 Presence of other vascular implants and grafts: Secondary | ICD-10-CM

## 2021-10-30 DIAGNOSIS — Z5112 Encounter for antineoplastic immunotherapy: Secondary | ICD-10-CM | POA: Diagnosis not present

## 2021-10-30 DIAGNOSIS — Z5111 Encounter for antineoplastic chemotherapy: Secondary | ICD-10-CM | POA: Diagnosis not present

## 2021-10-30 DIAGNOSIS — K5900 Constipation, unspecified: Secondary | ICD-10-CM | POA: Diagnosis not present

## 2021-10-30 DIAGNOSIS — C8330 Diffuse large B-cell lymphoma, unspecified site: Secondary | ICD-10-CM | POA: Diagnosis not present

## 2021-10-30 DIAGNOSIS — E860 Dehydration: Secondary | ICD-10-CM | POA: Diagnosis not present

## 2021-10-30 DIAGNOSIS — C8338 Diffuse large B-cell lymphoma, lymph nodes of multiple sites: Secondary | ICD-10-CM

## 2021-10-30 DIAGNOSIS — C8510 Unspecified B-cell lymphoma, unspecified site: Secondary | ICD-10-CM

## 2021-10-30 LAB — PHOSPHORUS: Phosphorus: 3.5 mg/dL (ref 2.5–4.6)

## 2021-10-30 LAB — LACTATE DEHYDROGENASE: LDH: 659 U/L — ABNORMAL HIGH (ref 98–192)

## 2021-10-30 LAB — CBC WITH DIFFERENTIAL/PLATELET
Abs Immature Granulocytes: 0.02 10*3/uL (ref 0.00–0.07)
Basophils Absolute: 0 10*3/uL (ref 0.0–0.1)
Basophils Relative: 1 %
Eosinophils Absolute: 0.1 10*3/uL (ref 0.0–0.5)
Eosinophils Relative: 2 %
HCT: 40 % (ref 36.0–46.0)
Hemoglobin: 13.2 g/dL (ref 12.0–15.0)
Immature Granulocytes: 1 %
Lymphocytes Relative: 6 %
Lymphs Abs: 0.3 10*3/uL — ABNORMAL LOW (ref 0.7–4.0)
MCH: 29.7 pg (ref 26.0–34.0)
MCHC: 33 g/dL (ref 30.0–36.0)
MCV: 90.1 fL (ref 80.0–100.0)
Monocytes Absolute: 0.4 10*3/uL (ref 0.1–1.0)
Monocytes Relative: 9 %
Neutro Abs: 3.5 10*3/uL (ref 1.7–7.7)
Neutrophils Relative %: 81 %
Platelets: 191 10*3/uL (ref 150–400)
RBC: 4.44 MIL/uL (ref 3.87–5.11)
RDW: 14.2 % (ref 11.5–15.5)
WBC: 4.3 10*3/uL (ref 4.0–10.5)
nRBC: 0 % (ref 0.0–0.2)

## 2021-10-30 LAB — COMPREHENSIVE METABOLIC PANEL
ALT: 15 U/L (ref 0–44)
AST: 23 U/L (ref 15–41)
Albumin: 3 g/dL — ABNORMAL LOW (ref 3.5–5.0)
Alkaline Phosphatase: 66 U/L (ref 38–126)
Anion gap: 10 (ref 5–15)
BUN: 23 mg/dL (ref 8–23)
CO2: 25 mmol/L (ref 22–32)
Calcium: 8.8 mg/dL — ABNORMAL LOW (ref 8.9–10.3)
Chloride: 93 mmol/L — ABNORMAL LOW (ref 98–111)
Creatinine, Ser: 0.83 mg/dL (ref 0.44–1.00)
GFR, Estimated: >60 mL/min (ref >60)
Glucose, Bld: 102 mg/dL — ABNORMAL HIGH (ref 70–99)
Potassium: 3.8 mmol/L (ref 3.5–5.1)
Sodium: 128 mmol/L — ABNORMAL LOW (ref 135–145)
Total Bilirubin: 0.7 mg/dL (ref 0.3–1.2)
Total Protein: 5.9 g/dL — ABNORMAL LOW (ref 6.5–8.1)

## 2021-10-30 LAB — URIC ACID: Uric Acid, Serum: 2.3 mg/dL — ABNORMAL LOW (ref 2.5–7.1)

## 2021-10-30 LAB — MAGNESIUM: Magnesium: 1.6 mg/dL — ABNORMAL LOW (ref 1.7–2.4)

## 2021-10-30 MED ORDER — HEPARIN SOD (PORK) LOCK FLUSH 100 UNIT/ML IV SOLN
500.0000 [IU] | Freq: Once | INTRAVENOUS | Status: AC | PRN
  Administered 2021-10-30: 500 [IU]

## 2021-10-30 MED ORDER — SODIUM CHLORIDE 0.9 % IV SOLN
6.0000 mg | Freq: Once | INTRAVENOUS | Status: AC
  Administered 2021-10-30: 6 mg via INTRAVENOUS
  Filled 2021-10-30: qty 4

## 2021-10-30 MED ORDER — FAMOTIDINE IN NACL 20-0.9 MG/50ML-% IV SOLN
20.0000 mg | Freq: Once | INTRAVENOUS | Status: AC
  Administered 2021-10-30: 20 mg via INTRAVENOUS
  Filled 2021-10-30: qty 50

## 2021-10-30 MED ORDER — LORAZEPAM 2 MG/ML IJ SOLN
0.5000 mg | Freq: Once | INTRAMUSCULAR | Status: AC
  Administered 2021-10-30: 0.5 mg via INTRAVENOUS
  Filled 2021-10-30: qty 1

## 2021-10-30 MED ORDER — ACETAMINOPHEN 325 MG PO TABS
650.0000 mg | ORAL_TABLET | Freq: Once | ORAL | Status: AC
  Administered 2021-10-30: 650 mg via ORAL
  Filled 2021-10-30: qty 2

## 2021-10-30 MED ORDER — SODIUM CHLORIDE 0.9 % IV SOLN
375.0000 mg/m2 | Freq: Once | INTRAVENOUS | Status: AC
  Administered 2021-10-30: 700 mg via INTRAVENOUS
  Filled 2021-10-30: qty 50

## 2021-10-30 MED ORDER — DIPHENHYDRAMINE HCL 25 MG PO CAPS
50.0000 mg | ORAL_CAPSULE | Freq: Once | ORAL | Status: AC
  Administered 2021-10-30: 50 mg via ORAL
  Filled 2021-10-30: qty 2

## 2021-10-30 MED ORDER — MAGNESIUM SULFATE 2 GM/50ML IV SOLN
2.0000 g | Freq: Once | INTRAVENOUS | Status: AC
  Administered 2021-10-30: 2 g via INTRAVENOUS
  Filled 2021-10-30: qty 50

## 2021-10-30 MED ORDER — SODIUM CHLORIDE 0.9 % IV SOLN: Freq: Once | INTRAVENOUS | Status: AC

## 2021-10-30 MED ORDER — SODIUM CHLORIDE 0.9% FLUSH
10.0000 mL | INTRAVENOUS | Status: DC | PRN
  Administered 2021-10-30: 10 mL

## 2021-10-30 NOTE — Patient Instructions (Signed)
[facility]  Discharge Instructions: Thank you for choosing [facility] to provide your oncology and hematology care.  If you have a lab appointment with the Cancer Center, please come in thru the Main Entrance and check in at the main information desk.  Wear comfortable clothing and clothing appropriate for easy access to any Portacath or PICC line.   We strive to give you quality time with your provider. You may need to reschedule your appointment if you arrive late (15 or more minutes).  Arriving late affects you and other patients whose appointments are after yours.  Also, if you miss three or more appointments without notifying the office, you may be dismissed from the clinic at the provider's discretion.      For prescription refill requests, have your pharmacy contact our office and allow 72 hours for refills to be completed.        To help prevent nausea and vomiting after your treatment, we encourage you to take your nausea medication as directed.  BELOW ARE SYMPTOMS THAT SHOULD BE REPORTED IMMEDIATELY: *FEVER GREATER THAN 100.4 F (38 C) OR HIGHER *CHILLS OR SWEATING *NAUSEA AND VOMITING THAT IS NOT CONTROLLED WITH YOUR NAUSEA MEDICATION *UNUSUAL SHORTNESS OF BREATH *UNUSUAL BRUISING OR BLEEDING *URINARY PROBLEMS (pain or burning when urinating, or frequent urination) *BOWEL PROBLEMS (unusual diarrhea, constipation, pain near the anus) TENDERNESS IN MOUTH AND THROAT WITH OR WITHOUT PRESENCE OF ULCERS (sore throat, sores in mouth, or a toothache) UNUSUAL RASH, SWELLING OR PAIN  UNUSUAL VAGINAL DISCHARGE OR ITCHING   Items with * indicate a potential emergency and should be followed up as soon as possible or go to the Emergency Department if any problems should occur.  Please show the CHEMOTHERAPY ALERT CARD or IMMUNOTHERAPY ALERT CARD at check-in to the Emergency Department and triage nurse.  Should you have questions after your visit or need to cancel  or reschedule your appointment, please contact [facility] [phone]  and follow the prompts.  Office hours are 8:00 a.m. to 4:30 p.m. Monday - Friday. Please note that voicemails left after 4:00 p.m. may not be returned until the following business day.  We are closed weekends and major holidays. You have access to a nurse at all times for urgent questions. Please call the main number to the clinic [phone] and follow the prompts.  For any non-urgent questions, you may also contact your provider using MyChart. We now offer e-Visits for anyone 18 and older to request care online for non-urgent symptoms. For details visit [email or web address].   Also download the MyChart app! Go to the app store, search "MyChart", open the app, select [facility], and log in with your MyChart username and password.  Due to Covid, a mask is required upon entering the hospital/clinic. If you do not have a mask, one will be given to you upon arrival. For doctor visits, patients may have 1 support person aged 18 or older with them. For treatment visits, patients cannot have anyone with them due to current Covid guidelines and our immunocompromised population.  

## 2021-10-30 NOTE — Progress Notes (Signed)
Patient presents today for chemotherapy infusion.  Patient c/o nausea this morning, but she believes this is secondary to the Boost she had to drink.  Otherwise, patient is in satisfactory condition.  Port placement verified by chest x-ray on 10/24/2021.  Labs results are pending. ? ?Patient c/o pain and nausea.  Dr. Delton Coombes notified and he advised to give patient Ativan 0.5 mg IV x one dose.   ? ?Labs reviewed.  All labs are within treatment parameters.  Magnesium today is 1.6.  We will give Magnesium 2 gram IV x one dose today per Dr. Delton Coombes.  We will proceed with treatment per MD orders.  ? ?Patient tolerated treatment well with no complaints voiced.  Patient left via wheelchair with son in stable condition.  Vital signs stable at discharge.  Follow up as scheduled.    ?

## 2021-10-31 ENCOUNTER — Inpatient Hospital Stay (HOSPITAL_COMMUNITY): Payer: Medicare PPO

## 2021-10-31 ENCOUNTER — Other Ambulatory Visit: Payer: Self-pay

## 2021-10-31 VITALS — BP 151/76 | HR 69 | Temp 97.8°F | Resp 18

## 2021-10-31 DIAGNOSIS — Z5189 Encounter for other specified aftercare: Secondary | ICD-10-CM | POA: Diagnosis not present

## 2021-10-31 DIAGNOSIS — Z79899 Other long term (current) drug therapy: Secondary | ICD-10-CM | POA: Diagnosis not present

## 2021-10-31 DIAGNOSIS — Z5112 Encounter for antineoplastic immunotherapy: Secondary | ICD-10-CM | POA: Diagnosis not present

## 2021-10-31 DIAGNOSIS — C8338 Diffuse large B-cell lymphoma, lymph nodes of multiple sites: Secondary | ICD-10-CM

## 2021-10-31 DIAGNOSIS — R61 Generalized hyperhidrosis: Secondary | ICD-10-CM | POA: Diagnosis not present

## 2021-10-31 DIAGNOSIS — Z95828 Presence of other vascular implants and grafts: Secondary | ICD-10-CM

## 2021-10-31 DIAGNOSIS — E860 Dehydration: Secondary | ICD-10-CM | POA: Diagnosis not present

## 2021-10-31 DIAGNOSIS — C8330 Diffuse large B-cell lymphoma, unspecified site: Secondary | ICD-10-CM | POA: Diagnosis not present

## 2021-10-31 DIAGNOSIS — K5900 Constipation, unspecified: Secondary | ICD-10-CM | POA: Diagnosis not present

## 2021-10-31 DIAGNOSIS — Z5111 Encounter for antineoplastic chemotherapy: Secondary | ICD-10-CM | POA: Diagnosis not present

## 2021-10-31 DIAGNOSIS — R350 Frequency of micturition: Secondary | ICD-10-CM | POA: Diagnosis not present

## 2021-10-31 MED ORDER — VINCRISTINE SULFATE CHEMO INJECTION 1 MG/ML
1.0000 mg | Freq: Once | INTRAVENOUS | Status: AC
  Administered 2021-10-31: 11:00:00 1 mg via INTRAVENOUS
  Filled 2021-10-31: qty 1

## 2021-10-31 MED ORDER — SODIUM CHLORIDE 0.9 % IV SOLN
10.0000 mg | Freq: Once | INTRAVENOUS | Status: AC
  Administered 2021-10-31: 10:00:00 10 mg via INTRAVENOUS
  Filled 2021-10-31: qty 10

## 2021-10-31 MED ORDER — SODIUM CHLORIDE 0.9 % IV SOLN
150.0000 mg | Freq: Once | INTRAVENOUS | Status: AC
  Administered 2021-10-31: 09:00:00 150 mg via INTRAVENOUS
  Filled 2021-10-31: qty 150

## 2021-10-31 MED ORDER — PALONOSETRON HCL INJECTION 0.25 MG/5ML
0.2500 mg | Freq: Once | INTRAVENOUS | Status: AC
  Administered 2021-10-31: 09:00:00 0.25 mg via INTRAVENOUS
  Filled 2021-10-31: qty 5

## 2021-10-31 MED ORDER — DOXORUBICIN HCL CHEMO IV INJECTION 2 MG/ML
25.0000 mg/m2 | Freq: Once | INTRAVENOUS | Status: AC
  Administered 2021-10-31: 11:00:00 48 mg via INTRAVENOUS
  Filled 2021-10-31: qty 24

## 2021-10-31 MED ORDER — SODIUM CHLORIDE 0.9% FLUSH
10.0000 mL | INTRAVENOUS | Status: DC | PRN
  Administered 2021-10-31: 12:00:00 10 mL

## 2021-10-31 MED ORDER — SODIUM CHLORIDE 0.9 % IV SOLN
600.0000 mg/m2 | Freq: Once | INTRAVENOUS | Status: AC
  Administered 2021-10-31: 11:00:00 1160 mg via INTRAVENOUS
  Filled 2021-10-31: qty 58

## 2021-10-31 MED ORDER — HEPARIN SOD (PORK) LOCK FLUSH 100 UNIT/ML IV SOLN
500.0000 [IU] | Freq: Once | INTRAVENOUS | Status: AC | PRN
  Administered 2021-10-31: 12:00:00 500 [IU]

## 2021-10-31 MED ORDER — SODIUM CHLORIDE 0.9 % IV SOLN: Freq: Once | INTRAVENOUS | Status: AC

## 2021-10-31 NOTE — Patient Instructions (Signed)
[facility]  Discharge Instructions: Thank you for choosing [facility] to provide your oncology and hematology care.  If you have a lab appointment with the Cancer Center, please come in thru the Main Entrance and check in at the main information desk.  Wear comfortable clothing and clothing appropriate for easy access to any Portacath or PICC line.   We strive to give you quality time with your provider. You may need to reschedule your appointment if you arrive late (15 or more minutes).  Arriving late affects you and other patients whose appointments are after yours.  Also, if you miss three or more appointments without notifying the office, you may be dismissed from the clinic at the provider's discretion.      For prescription refill requests, have your pharmacy contact our office and allow 72 hours for refills to be completed.        To help prevent nausea and vomiting after your treatment, we encourage you to take your nausea medication as directed.  BELOW ARE SYMPTOMS THAT SHOULD BE REPORTED IMMEDIATELY: *FEVER GREATER THAN 100.4 F (38 C) OR HIGHER *CHILLS OR SWEATING *NAUSEA AND VOMITING THAT IS NOT CONTROLLED WITH YOUR NAUSEA MEDICATION *UNUSUAL SHORTNESS OF BREATH *UNUSUAL BRUISING OR BLEEDING *URINARY PROBLEMS (pain or burning when urinating, or frequent urination) *BOWEL PROBLEMS (unusual diarrhea, constipation, pain near the anus) TENDERNESS IN MOUTH AND THROAT WITH OR WITHOUT PRESENCE OF ULCERS (sore throat, sores in mouth, or a toothache) UNUSUAL RASH, SWELLING OR PAIN  UNUSUAL VAGINAL DISCHARGE OR ITCHING   Items with * indicate a potential emergency and should be followed up as soon as possible or go to the Emergency Department if any problems should occur.  Please show the CHEMOTHERAPY ALERT CARD or IMMUNOTHERAPY ALERT CARD at check-in to the Emergency Department and triage nurse.  Should you have questions after your visit or need to cancel  or reschedule your appointment, please contact [facility] [phone]  and follow the prompts.  Office hours are 8:00 a.m. to 4:30 p.m. Monday - Friday. Please note that voicemails left after 4:00 p.m. may not be returned until the following business day.  We are closed weekends and major holidays. You have access to a nurse at all times for urgent questions. Please call the main number to the clinic [phone] and follow the prompts.  For any non-urgent questions, you may also contact your provider using MyChart. We now offer e-Visits for anyone 18 and older to request care online for non-urgent symptoms. For details visit [email or web address].   Also download the MyChart app! Go to the app store, search "MyChart", open the app, select , and log in with your MyChart username and password.  Due to Covid, a mask is required upon entering the hospital/clinic. If you do not have a mask, one will be given to you upon arrival. For doctor visits, patients may have 1 support person aged 18 or older with them. For treatment visits, patients cannot have anyone with them due to current Covid guidelines and our immunocompromised population.  

## 2021-10-31 NOTE — Progress Notes (Signed)
Patient presents today for chemotherapy infusion.  Patient is in satisfactory condition with no complaints voiced.  Pain has improved today per patient.  Vital signs are stable.  Labs were done on 10/30/2021 and all are within treatment parameters.  We will proceed with treatment per MD orders.  ? ?Patient tolerated treatment well with no complaints voiced.  Patient left via wheelchair with son in stable condition.  Vital signs stable at discharge.  Follow up as scheduled.    ?

## 2021-11-01 ENCOUNTER — Encounter (HOSPITAL_COMMUNITY): Payer: Self-pay | Admitting: Hematology

## 2021-11-01 ENCOUNTER — Inpatient Hospital Stay (HOSPITAL_COMMUNITY): Payer: Medicare PPO

## 2021-11-01 VITALS — BP 137/79 | HR 66 | Temp 97.5°F | Resp 18

## 2021-11-01 DIAGNOSIS — R61 Generalized hyperhidrosis: Secondary | ICD-10-CM | POA: Diagnosis not present

## 2021-11-01 DIAGNOSIS — Z5112 Encounter for antineoplastic immunotherapy: Secondary | ICD-10-CM | POA: Diagnosis not present

## 2021-11-01 DIAGNOSIS — R350 Frequency of micturition: Secondary | ICD-10-CM | POA: Diagnosis not present

## 2021-11-01 DIAGNOSIS — E860 Dehydration: Secondary | ICD-10-CM | POA: Diagnosis not present

## 2021-11-01 DIAGNOSIS — Z79899 Other long term (current) drug therapy: Secondary | ICD-10-CM | POA: Diagnosis not present

## 2021-11-01 DIAGNOSIS — C8338 Diffuse large B-cell lymphoma, lymph nodes of multiple sites: Secondary | ICD-10-CM

## 2021-11-01 DIAGNOSIS — Z5189 Encounter for other specified aftercare: Secondary | ICD-10-CM | POA: Diagnosis not present

## 2021-11-01 DIAGNOSIS — Z5111 Encounter for antineoplastic chemotherapy: Secondary | ICD-10-CM | POA: Diagnosis not present

## 2021-11-01 DIAGNOSIS — C8330 Diffuse large B-cell lymphoma, unspecified site: Secondary | ICD-10-CM | POA: Diagnosis not present

## 2021-11-01 DIAGNOSIS — K5900 Constipation, unspecified: Secondary | ICD-10-CM | POA: Diagnosis not present

## 2021-11-01 DIAGNOSIS — Z95828 Presence of other vascular implants and grafts: Secondary | ICD-10-CM

## 2021-11-01 MED ORDER — PEGFILGRASTIM-CBQV 6 MG/0.6ML ~~LOC~~ SOSY
6.0000 mg | PREFILLED_SYRINGE | Freq: Once | SUBCUTANEOUS | Status: AC
  Administered 2021-11-01: 6 mg via SUBCUTANEOUS
  Filled 2021-11-01: qty 0.6

## 2021-11-01 NOTE — Progress Notes (Signed)
Molly Patel presents today for Udenyca injection per the provider's orders.  Stable during administration without incident; injection site WNL; see MAR for injection details.  Patient tolerated procedure well and without incident.  No questions or complaints noted at this time. Discharge from clinic via wheelchair in stable condition.  Alert and oriented X 3.  Follow up with [facility] as scheduled.  ?

## 2021-11-01 NOTE — Progress Notes (Signed)
11/01/21 24 hour call back.  Patient feels good, no nausea, vomiting, or diarrhea.  Patient says she feels tired but that's all. ?

## 2021-11-01 NOTE — Patient Instructions (Signed)
[facility]  Discharge Instructions: Thank you for choosing [facility] to provide your oncology and hematology care.  If you have a lab appointment with the Cancer Center, please come in thru the Main Entrance and check in at the main information desk.  Wear comfortable clothing and clothing appropriate for easy access to any Portacath or PICC line.   We strive to give you quality time with your provider. You may need to reschedule your appointment if you arrive late (15 or more minutes).  Arriving late affects you and other patients whose appointments are after yours.  Also, if you miss three or more appointments without notifying the office, you may be dismissed from the clinic at the provider's discretion.      For prescription refill requests, have your pharmacy contact our office and allow 72 hours for refills to be completed.    Today you received the following chemotherapy and/or immunotherapy agents Udenyca      To help prevent nausea and vomiting after your treatment, we encourage you to take your nausea medication as directed.  BELOW ARE SYMPTOMS THAT SHOULD BE REPORTED IMMEDIATELY: *FEVER GREATER THAN 100.4 F (38 C) OR HIGHER *CHILLS OR SWEATING *NAUSEA AND VOMITING THAT IS NOT CONTROLLED WITH YOUR NAUSEA MEDICATION *UNUSUAL SHORTNESS OF BREATH *UNUSUAL BRUISING OR BLEEDING *URINARY PROBLEMS (pain or burning when urinating, or frequent urination) *BOWEL PROBLEMS (unusual diarrhea, constipation, pain near the anus) TENDERNESS IN MOUTH AND THROAT WITH OR WITHOUT PRESENCE OF ULCERS (sore throat, sores in mouth, or a toothache) UNUSUAL RASH, SWELLING OR PAIN  UNUSUAL VAGINAL DISCHARGE OR ITCHING   Items with * indicate a potential emergency and should be followed up as soon as possible or go to the Emergency Department if any problems should occur.  Please show the CHEMOTHERAPY ALERT CARD or IMMUNOTHERAPY ALERT CARD at check-in to the Emergency  Department and triage nurse.  Should you have questions after your visit or need to cancel or reschedule your appointment, please contact [facility] [phone]  and follow the prompts.  Office hours are 8:00 a.m. to 4:30 p.m. Monday - Friday. Please note that voicemails left after 4:00 p.m. may not be returned until the following business day.  We are closed weekends and major holidays. You have access to a nurse at all times for urgent questions. Please call the main number to the clinic [phone] and follow the prompts.  For any non-urgent questions, you may also contact your provider using MyChart. We now offer e-Visits for anyone 18 and older to request care online for non-urgent symptoms. For details visit [email or web address].   Also download the MyChart app! Go to the app store, search "MyChart", open the app, select [facility], and log in with your MyChart username and password.  Due to Covid, a mask is required upon entering the hospital/clinic. If you do not have a mask, one will be given to you upon arrival. For doctor visits, patients may have 1 support person aged 18 or older with them. For treatment visits, patients cannot have anyone with them due to current Covid guidelines and our immunocompromised population.  

## 2021-11-05 LAB — SURGICAL PATHOLOGY

## 2021-11-05 NOTE — Progress Notes (Signed)
[facility] ?[address] ?[place], [place] [address] ?Phone: [phone] ?Fax: [phone] ? ?SYMPTOMS MANAGEMENT CLINIC PROGRESS NOTE  ? ?Molly Patel ?[id number] ?1946-06-01 ?76 y.o. ? ?Molly Patel is managed by Dr. Delton Coombes for stage IVb diffuse large B-cell lymphoma ? ?Actively treated with chemotherapy/immunotherapy/hormonal therapy: YES ? ?Current therapy: R-CHOP every 21 days ? ?Last treated: Received cycle #1 on 10/30/2021 and 10/31/2021 with Udenyca on 11/01/2021 ? ?Next scheduled appointment with provider: 11/20/2021 ? ?Subjective:  ?Chief Complaint: Symptom management ? ?Molly Patel is a 76 year old female who is managed by Dr. Delton Coombes for stage IVb diffuse large B-cell lymphoma.  She is receiving R-CHOP every 21 days, received cycle #1 on 10/30/2021 and 10/31/2021 with Udenyca on 11/01/2021.  She is accompanied today by her sister and her niece. ? ?She  reports that she has had extreme fatigue following her chemo. ?She has poor appetite, is eating about 25% of her usual diet, but is also drinking 2 high-calorie Boosts each day.  She estimates that she drinks about 32 ounces of water each day. ?Weight today is 168 pounds, which is down 6 pounds in the past 12 days. ? ?She has some chronic constipation, which has been particularly worse in the past 3 days. ?She has been taking 1 Colace and 1 dose of MiraLAX daily without improvement. ?Her last bowel movement was yesterday, but it was described as small and hard. ? ?She reports drenching night sweats since starting chemotherapy.  She denies any fever or chills. ?She has noticed onset of bilateral ankle edema over the past week. ?Her breathing has improved after her most recent thoracentesis.  She denies any current shortness of breath or dyspnea.  She is not having any chest pain. ?She is having increased urinary frequency, especially at night.  She also reports urinary urgency and new onset of incontinence, but she denies  any burning or pain with urination. ? ?She denies any nausea, vomiting, diarrhea. ?She does not have any mouth sores. ?She denies any rash or skin changes. ?She denies any post chemo neuropathy. ?She has not noticed any new bruising or bleeding. ?She denies any new pain and reports that her previous reported right-sided chest pain has resolved. ? ? ?Review of Systems:  ?Review of Systems  ?Constitutional:  Positive for activity change, appetite change, diaphoresis, fatigue and unexpected weight change. Negative for chills and fever.  ?HENT:  Negative for mouth sores, nosebleeds, sore throat and trouble swallowing.   ?Respiratory:  Negative for cough and shortness of breath.   ?Cardiovascular:  Positive for leg swelling. Negative for chest pain and palpitations.  ?Gastrointestinal:  Positive for constipation. Negative for abdominal pain, blood in stool, diarrhea, nausea and vomiting.  ?Genitourinary:  Positive for frequency and urgency. Negative for dysuria and hematuria.  ?Neurological:  Negative for dizziness, light-headedness, numbness and headaches.  ?Psychiatric/Behavioral:  Negative for dysphoric mood and sleep disturbance. The patient is not nervous/anxious.   ? ? ?Past Medical History, Surgical history, Social history, and Family history were reviewed as documented elsewhere in chart, and were updated as appropriate.  ? ?Assessment & Plan:    ?1.  Stage IVb diffuse large B-cell lymphoma: ?- Primary oncologist is Dr. Delton Coombes ?- She has widespread/extensive lymphoma on both sides of the diaphragm as well as involvement of retroperitoneum and widespread osseous disease.  Extensive involvement of the right hemithorax with significant pleural tumor and associated pleural effusion.  Spleen involved. Question spinal canal involvement in the lower  thoracic area.  (PET scan 10/17/2021) ?- MRI thoracic spine ordered by Dr. Delton Coombes was performed today, with results pending.   ?- Patient is receiving R-CHOP every 21  days, received cycle #1 on 10/30/2021 and 10/31/2021 with Udenyca on 11/01/2021. ?- Labs today (11/06/2021): CBC with normal WBC/hemoglobin, mild thrombocytopenia with platelets 120, lymphopenia with lymphocyte 0.3. ?- CMP with mild hyponatremia 132, elevated BUN 25 but normal creatinine 0.73, hypoalbuminemia 2.9 ?- Potassium normal, magnesium normal, LDH elevated at 360, but trending down from before.  Uric acid slightly low at 2.2.  Phosphorus normal at 3.1. ?- PLAN: Scheduled for next set of labs, MD visit, and cycle #2 of treatment on 11/20/2021. ?- Continue allopurinol for TLS prophylaxis ? ?2.  Right pleural effusion: ?- CT chest angiogram (09/25/2021) was negative for PE, but showed large right pleural effusion with partial collapse of right lung.  There was partially seen large right retroperitoneal soft tissue mass measuring 4.4 x 5.8 x 7 cm extending superiorly along the right para-aortic region through the diaphragmatic hiatus into the thorax and pleural space.  There was severe soft tissue pleural thickening along the posterior aspect of the right pleural space measuring 2.8 cm in maximal thickness.  There was 4.2 x 3.1 cm right pleural-based mass along the anterior medial aspect abutting the pericardium. ?- Pleural fluid flow cytometry confirmed malignant effusion ?- Thoracentesis on 10/10/2021 with 1.5 L pleural fluid removed ?- Thoracentesis 10/24/2021 with 1.4 L pleural fluid ?- Reports that dyspnea is improved, but she has very quiet lung sounds throughout all right-sided lung fields.  Normal respiratory rate.  Oxygen 94% on room air. ?- PLAN: We will schedule her for another thoracentesis this Friday. ? ?3.  Nutrition and hydration ?- She has poor appetite, is eating about 25% of her usual diet, but is also drinking 2 high-calorie Boosts each day. ?- She estimates that she drinks about 32 ounces of water each day. ?- Weight today is 168 pounds, which is down 6 pounds in the past 12 days. ?- She appears  dehydrated with marginal blood pressure and elevated BUN ?- PLAN: Referral to dietitian. ?- Encouraged to eat frequent small meals throughout the day, drink high-calorie Boost x3, drink 64 ounce of water daily ?- Scheduled for IV fluids this Friday ? ?4.  Constipation ?- She has been taking 1 Colace and 1 dose of MiraLAX daily without improvement. ?- Her last bowel movement was yesterday, but it was described as small and hard. ?- PLAN: Instructed to take Colace 2 capsules twice daily.  Prescription for lactulose sent with instructions to take 30 mL every 3 hours if she has not had a bowel movement in 2 days (maximum of 3 doses in 1 day). ? ?5.  Urinary symptoms ?- Reports  increased urinary frequency, especially at night.  She also reports urinary urgency and new onset of incontinence, but she denies any burning or pain with urination. ?- PLAN: Increased frequency at night may be secondary to redistribution of peripheral edema while supine. ?- She feels that she is unable to provide urine sample today, we will check urinalysis during her IV fluid appointment on Friday. ? ?6.  Peripheral edema ?- She has onset of bilateral ankle edema over the past week ?- PLAN: Acute on chronic edema worsened from baseline after chemotherapy, possibly worsened due to hypoalbuminemia. ?- We will  hold off on any diuretics for the time being, since this would further dehydrate patient without effectively redistributing fluid. ?- Recommend keeping  legs elevated and using compression stockings. ? ?7.  Night sweats ?- She reports drenching night sweats since starting chemotherapy.  She denies any fever or chills. ?- PLAN: Recommend wearing loosefitting clothing, and sleeping with a fan or window open. ? ? ?Objective:  ? ?Physical Exam:  ?There were no vitals taken for this visit. ?ECOG: 3 ? ?Physical Exam ?Constitutional:   ?   Appearance: Normal appearance.  ?   Comments: Extremely weak and fatigued.  ?HENT:  ?   Head: Normocephalic  and atraumatic.  ?   Mouth/Throat:  ?   Mouth: Mucous membranes are moist.  ?Eyes:  ?   Extraocular Movements: Extraocular movements intact.  ?   Pupils: Pupils are equal, round, and reactive to light.  ?Musician

## 2021-11-06 ENCOUNTER — Ambulatory Visit (HOSPITAL_COMMUNITY)
Admission: RE | Admit: 2021-11-06 | Discharge: 2021-11-06 | Disposition: A | Discharge status: Home / Self Care | Payer: Medicare PPO | Source: Ambulatory Visit | Attending: Hematology | Admitting: Hematology

## 2021-11-06 ENCOUNTER — Inpatient Hospital Stay (HOSPITAL_COMMUNITY): Payer: Medicare PPO

## 2021-11-06 ENCOUNTER — Other Ambulatory Visit: Payer: Self-pay

## 2021-11-06 ENCOUNTER — Inpatient Hospital Stay (HOSPITAL_BASED_OUTPATIENT_CLINIC_OR_DEPARTMENT_OTHER): Payer: Medicare PPO | Admitting: Physician Assistant

## 2021-11-06 DIAGNOSIS — C8510 Unspecified B-cell lymphoma, unspecified site: Secondary | ICD-10-CM | POA: Insufficient documentation

## 2021-11-06 DIAGNOSIS — R350 Frequency of micturition: Secondary | ICD-10-CM

## 2021-11-06 DIAGNOSIS — C8338 Diffuse large B-cell lymphoma, lymph nodes of multiple sites: Secondary | ICD-10-CM

## 2021-11-06 DIAGNOSIS — C8330 Diffuse large B-cell lymphoma, unspecified site: Secondary | ICD-10-CM | POA: Diagnosis not present

## 2021-11-06 DIAGNOSIS — Z09 Encounter for follow-up examination after completed treatment for conditions other than malignant neoplasm: Secondary | ICD-10-CM

## 2021-11-06 DIAGNOSIS — Z79899 Other long term (current) drug therapy: Secondary | ICD-10-CM | POA: Diagnosis not present

## 2021-11-06 DIAGNOSIS — Z5111 Encounter for antineoplastic chemotherapy: Secondary | ICD-10-CM | POA: Diagnosis not present

## 2021-11-06 DIAGNOSIS — R61 Generalized hyperhidrosis: Secondary | ICD-10-CM | POA: Diagnosis not present

## 2021-11-06 DIAGNOSIS — Z5189 Encounter for other specified aftercare: Secondary | ICD-10-CM | POA: Diagnosis not present

## 2021-11-06 DIAGNOSIS — E860 Dehydration: Secondary | ICD-10-CM | POA: Diagnosis not present

## 2021-11-06 DIAGNOSIS — M4804 Spinal stenosis, thoracic region: Secondary | ICD-10-CM | POA: Diagnosis not present

## 2021-11-06 DIAGNOSIS — M47815 Spondylosis without myelopathy or radiculopathy, thoracolumbar region: Secondary | ICD-10-CM | POA: Diagnosis not present

## 2021-11-06 DIAGNOSIS — K5909 Other constipation: Secondary | ICD-10-CM | POA: Diagnosis not present

## 2021-11-06 DIAGNOSIS — Z5112 Encounter for antineoplastic immunotherapy: Secondary | ICD-10-CM | POA: Diagnosis not present

## 2021-11-06 DIAGNOSIS — K5900 Constipation, unspecified: Secondary | ICD-10-CM | POA: Diagnosis not present

## 2021-11-06 LAB — CBC WITH DIFFERENTIAL/PLATELET
Band Neutrophils: 4 %
Basophils Absolute: 0 10*3/uL (ref 0.0–0.1)
Basophils Relative: 0 %
Eosinophils Absolute: 0.2 10*3/uL (ref 0.0–0.5)
Eosinophils Relative: 3 %
HCT: 38.1 % (ref 36.0–46.0)
Hemoglobin: 12.1 g/dL (ref 12.0–15.0)
Lymphocytes Relative: 6 %
Lymphs Abs: 0.3 10*3/uL — ABNORMAL LOW (ref 0.7–4.0)
MCH: 28.9 pg (ref 26.0–34.0)
MCHC: 31.8 g/dL (ref 30.0–36.0)
MCV: 91.1 fL (ref 80.0–100.0)
Monocytes Absolute: 0.1 10*3/uL (ref 0.1–1.0)
Monocytes Relative: 2 %
Neutro Abs: 4.7 10*3/uL (ref 1.7–7.7)
Neutrophils Relative %: 85 %
Platelets: 120 10*3/uL — ABNORMAL LOW (ref 150–400)
RBC: 4.18 MIL/uL (ref 3.87–5.11)
RDW: 14.5 % (ref 11.5–15.5)
WBC: 5.3 10*3/uL (ref 4.0–10.5)
nRBC: 0 % (ref 0.0–0.2)

## 2021-11-06 LAB — COMPREHENSIVE METABOLIC PANEL
ALT: 13 U/L (ref 0–44)
AST: 11 U/L — ABNORMAL LOW (ref 15–41)
Albumin: 2.9 g/dL — ABNORMAL LOW (ref 3.5–5.0)
Alkaline Phosphatase: 74 U/L (ref 38–126)
Anion gap: 8 (ref 5–15)
BUN: 25 mg/dL — ABNORMAL HIGH (ref 8–23)
CO2: 31 mmol/L (ref 22–32)
Calcium: 8.3 mg/dL — ABNORMAL LOW (ref 8.9–10.3)
Chloride: 93 mmol/L — ABNORMAL LOW (ref 98–111)
Creatinine, Ser: 0.73 mg/dL (ref 0.44–1.00)
GFR, Estimated: >60 mL/min (ref >60)
Glucose, Bld: 127 mg/dL — ABNORMAL HIGH (ref 70–99)
Potassium: 3.8 mmol/L (ref 3.5–5.1)
Sodium: 132 mmol/L — ABNORMAL LOW (ref 135–145)
Total Bilirubin: 0.9 mg/dL (ref 0.3–1.2)
Total Protein: 5.3 g/dL — ABNORMAL LOW (ref 6.5–8.1)

## 2021-11-06 LAB — URIC ACID: Uric Acid, Serum: 2.2 mg/dL — ABNORMAL LOW (ref 2.5–7.1)

## 2021-11-06 LAB — PHOSPHORUS: Phosphorus: 3.1 mg/dL (ref 2.5–4.6)

## 2021-11-06 LAB — MAGNESIUM: Magnesium: 1.9 mg/dL (ref 1.7–2.4)

## 2021-11-06 LAB — LACTATE DEHYDROGENASE: LDH: 360 U/L — ABNORMAL HIGH (ref 98–192)

## 2021-11-06 MED ORDER — LACTULOSE 20 GM/30ML PO SOLN: 30.0000 mL | Freq: Three times a day (TID) | ORAL | 0 refills | Status: DC | PRN

## 2021-11-06 MED ORDER — GADOBUTROL 1 MMOL/ML IV SOLN
7.5000 mL | Freq: Once | INTRAVENOUS | Status: AC | PRN
  Administered 2021-11-06: 7.5 mL via INTRAVENOUS

## 2021-11-06 MED ORDER — SODIUM CHLORIDE 0.9% FLUSH
10.0000 mL | Freq: Once | INTRAVENOUS | Status: AC
  Administered 2021-11-06: 10 mL via INTRAVENOUS

## 2021-11-06 MED ORDER — HEPARIN SOD (PORK) LOCK FLUSH 100 UNIT/ML IV SOLN
500.0000 [IU] | Freq: Once | INTRAVENOUS | Status: AC
  Administered 2021-11-06: 500 [IU] via INTRAVENOUS

## 2021-11-06 NOTE — Patient Instructions (Signed)
[place] at [facility] ?Discharge Instructions ? ?You were seen today by Tarri Abernethy PA-C for symptom management.   ? ?CONSTIPATION: ?- Colace 100 mg capsule is available over-the-counter.  Take 1 capsule of Colace each morning and 1 capsule Colace each night. ?- If that does not work, you can increase to 2 capsules Colace each morning and 2 capsules Colace each night. ?- You should be having a bowel movement at least every other day. ?- If you have not had a bowel movement in more than 2 days, take lactulose 30 mL every 3 hours until you have had a bowel movement.  (Do NOT take any more than 3 doses in 1 day.) ?- If you are still having significant constipation, please call the [facility]. ? ?NUTRITION & HYDRATION: ?- Eat frequent small meals throughout the day. ?- Drink high-calorie Boost x3 throughout the day. ?- Drink 64 ounces of water daily ?- We will refer you to our cancer center nutritionist ?-**We will schedule you for IV FLUIDS on FRIDAY, 11/08/2021 ? ?URINARY FREQUENCY: ?- This could be related to the swelling in your legs.  Keep your legs propped up throughout the day to decrease the swelling. ?- We will check you for possible urinary tract infection when you come back on Friday. ? ?PLEURAL EFFUSION: ?- We will schedule you for thoracentesis (fluid removal) of your lung on FRIDAY 11/08/2021 ?-**Seek IMMEDIATE MEDICAL ATTENTION if you have any severe shortness of breath, cough, or difficulty breathing. ? ?FOLLOW-UP APPOINTMENT: You will have repeat labs, visit with Dr. Delton Coombes, and your next cycle of treatment on 11/20/2021 ? ? ?Thank you for choosing [facility] at [facility] to provide your oncology and hematology care.  To afford each patient quality time with our provider, please arrive at least 15 minutes before your scheduled appointment time.  ? ?If you have a lab appointment with the [facility] please come in thru the Main Entrance and  check in at the main information desk. ? ?You need to re-schedule your appointment should you arrive 10 or more minutes late.  We strive to give you quality time with our providers, and arriving late affects you and other patients whose appointments are after yours.  Also, if you no show three or more times for appointments you may be dismissed from the clinic at the providers discretion.     ?Again, thank you for choosing [facility].  Our hope is that these requests will decrease the amount of time that you wait before being seen by our physicians.       ?_____________________________________________________________ ? ?Should you have questions after your visit to [facility], please contact our office at [phone] and follow the prompts.  Our office hours are 8:00 a.m. and 4:30 p.m. Monday - Friday.  Please note that voicemails left after 4:00 p.m. may not be returned until the following business day.  We are closed weekends and major holidays.  You do have access to a nurse 24-7, just call the main number to the clinic [phone] and do not press any options, hold on the line and a nurse will answer the phone.   ? ?For prescription refill requests, have your pharmacy contact our office and allow 72 hours.   ? ?Due to Covid, you will need to wear a mask upon entering the hospital. If you do not have a mask, a mask will be given to you at  the Main Entrance upon arrival. For doctor visits, patients may have 1 support person age 67 or older with them. For treatment visits, patients can not have anyone with them due to social distancing guidelines and our immunocompromised population.  ? ? ? ?

## 2021-11-07 ENCOUNTER — Encounter (HOSPITAL_COMMUNITY): Payer: Medicare PPO | Admitting: Dietician

## 2021-11-07 ENCOUNTER — Telehealth (HOSPITAL_COMMUNITY): Payer: Self-pay | Admitting: Dietician

## 2021-11-07 NOTE — Telephone Encounter (Signed)
Nutrition Assessment ? ? ?Reason for Assessment: Provider referred (poor appetite, wt loss) ? ? ?ASSESSMENT: 76 year old female with stage IV diffuse large B-cell lymphoma. She is receiving R-CHOP q21d. Patient is under the care of Dr. Delton Coombes.  ? ?Past medical history includes angioedema, HLD, HTN, stroke, seizures s/p craniotomy (1990's), dementia, vit D deficiency ? ?Patient is reported resting this morning at time of telephone visit. Spoke with niece who has recently moved to town to help her mother with care giving of patient. Niece reports patient with ongoing decreased appetite in the last few months. She is not unable to eat much at meal times, endorses early satiety. Patient is drinking 2 Boost Original (260 kcal, 10 grams protein). Niece reports patient is "sort of vegetarian" eats salmon and some chicken, but does not eat other meats. Occasionally she will eat eggs. Patient usually will eat cereal for breakfast. She has not been enjoying oatmeal recently. She likes to snack on walnuts. Niece is asking about beans and if these are okay to have. Niece reports that patient goes to stay with her son, who is vegan, on the weekends. She is unsure what patient is eating when staying with him. Niece reports patient had a bowel movement after returning home from MD visit and did not need to use lactulose.  ? ?Nutrition Focused Physical Exam: unable to complete  ? ? ?Medications: colace, miralax, vit D, aricept, namenda, keppra, prednisone, microzide, lactulose  ? ? ?Labs: 3/15 - Na 132, Glucose 127, BUN 25 ? ? ?Anthropometrics: Weights have decreased ~10% (18 lbs) from usual weight in 13 months ? ?Height: 5' 5.75" ?Weight: 168 lb 6.9 oz (3/15) ?UBW: 186 lb (10/01/20 per chart) ?BMI: 27.39 ? ?Expect some wt fluctuation given patient with recurrent pleural effusion requiring thoracentesis, next scheduled for 3/17 ? ?1/31 - 1.4 L ?2/16 - 1.5 L ?3/02 - 1.4 L ? ? ?NUTRITION DIAGNOSIS: Inadequate oral intake related to  non-hodgkins lymphoma and associated chemotherapy side effects as evidenced by reported poor appetite, chronic constipation, recurrent pleural effusion, fatigue, unintentional 10% weight loss from usual weight in the past year.  ? ? ? ?INTERVENTION:  ?Educated on small frequent meals and snacks vs usual 3 meals  ?Suggested soft moist easy to chew and swallow foods for ease of intake - handout to be provided ?Discussed ways to add calories and protein to foods - shake/smoothie recipes to be provided ?Recommend increasing Boost to 3x/day and suggested switching to Boost Plus/equivalent for added calories/protein - coupons and samples Ensure as well as CIB to be provided ?Contact information provided  ?Handouts and samples left at registration desk for pick up on 3/17 (niece is aware) ? ? ? ?MONITORING, EVALUATION, GOAL: Patient will tolerate increased calories and protein to minimize weight loss ? ? ?Next Visit: will follow-up via phone ~2 weeks ? ? ? ? ? ? ?

## 2021-11-08 ENCOUNTER — Other Ambulatory Visit: Payer: Self-pay

## 2021-11-08 ENCOUNTER — Encounter (HOSPITAL_COMMUNITY): Payer: Self-pay

## 2021-11-08 ENCOUNTER — Ambulatory Visit (HOSPITAL_COMMUNITY)
Admission: RE | Admit: 2021-11-08 | Discharge: 2021-11-08 | Disposition: A | Discharge status: Home / Self Care | Payer: Medicare PPO | Source: Ambulatory Visit | Attending: Diagnostic Radiology | Admitting: Diagnostic Radiology

## 2021-11-08 ENCOUNTER — Inpatient Hospital Stay (HOSPITAL_COMMUNITY): Payer: Medicare PPO

## 2021-11-08 ENCOUNTER — Ambulatory Visit (HOSPITAL_COMMUNITY)
Admission: RE | Admit: 2021-11-08 | Discharge: 2021-11-08 | Disposition: A | Discharge status: Home / Self Care | Payer: Medicare PPO | Source: Ambulatory Visit | Attending: Hematology | Admitting: Hematology

## 2021-11-08 DIAGNOSIS — K5900 Constipation, unspecified: Secondary | ICD-10-CM | POA: Diagnosis not present

## 2021-11-08 DIAGNOSIS — C8510 Unspecified B-cell lymphoma, unspecified site: Secondary | ICD-10-CM | POA: Diagnosis not present

## 2021-11-08 DIAGNOSIS — R61 Generalized hyperhidrosis: Secondary | ICD-10-CM | POA: Diagnosis not present

## 2021-11-08 DIAGNOSIS — Z5189 Encounter for other specified aftercare: Secondary | ICD-10-CM | POA: Diagnosis not present

## 2021-11-08 DIAGNOSIS — R350 Frequency of micturition: Secondary | ICD-10-CM

## 2021-11-08 DIAGNOSIS — J90 Pleural effusion, not elsewhere classified: Secondary | ICD-10-CM | POA: Diagnosis not present

## 2021-11-08 DIAGNOSIS — Z5112 Encounter for antineoplastic immunotherapy: Secondary | ICD-10-CM | POA: Diagnosis not present

## 2021-11-08 DIAGNOSIS — Z5111 Encounter for antineoplastic chemotherapy: Secondary | ICD-10-CM | POA: Diagnosis not present

## 2021-11-08 DIAGNOSIS — E860 Dehydration: Secondary | ICD-10-CM | POA: Diagnosis not present

## 2021-11-08 DIAGNOSIS — Z9889 Other specified postprocedural states: Secondary | ICD-10-CM | POA: Insufficient documentation

## 2021-11-08 DIAGNOSIS — C8330 Diffuse large B-cell lymphoma, unspecified site: Secondary | ICD-10-CM | POA: Diagnosis not present

## 2021-11-08 DIAGNOSIS — Z79899 Other long term (current) drug therapy: Secondary | ICD-10-CM | POA: Diagnosis not present

## 2021-11-08 LAB — URINALYSIS, ROUTINE W REFLEX MICROSCOPIC
Bilirubin Urine: NEGATIVE
Glucose, UA: NEGATIVE mg/dL
Hgb urine dipstick: NEGATIVE
Ketones, ur: NEGATIVE mg/dL
Leukocytes,Ua: NEGATIVE
Nitrite: NEGATIVE
Protein, ur: NEGATIVE mg/dL
Specific Gravity, Urine: 1.016 (ref 1.005–1.030)
pH: 7 (ref 5.0–8.0)

## 2021-11-08 MED ORDER — SODIUM CHLORIDE 0.9% FLUSH
10.0000 mL | Freq: Once | INTRAVENOUS | Status: AC
  Administered 2021-11-08: 10 mL via INTRAVENOUS

## 2021-11-08 MED ORDER — HEPARIN SOD (PORK) LOCK FLUSH 100 UNIT/ML IV SOLN
500.0000 [IU] | Freq: Once | INTRAVENOUS | Status: AC
  Administered 2021-11-08: 500 [IU] via INTRAVENOUS

## 2021-11-08 MED ORDER — LIDOCAINE HCL (PF) 2 % IJ SOLN
10.0000 mL | Freq: Once | INTRAMUSCULAR | Status: AC
  Administered 2021-11-08: 10 mL

## 2021-11-08 MED ORDER — SODIUM CHLORIDE 0.9 % IV SOLN: Freq: Once | INTRAVENOUS | Status: AC

## 2021-11-08 NOTE — Patient Instructions (Signed)
[facility]  Discharge Instructions: ?Thank you for choosing [facility] to provide your oncology and hematology care.  ?If you have a lab appointment with the [facility], please come in thru the Main Entrance and check in at the main information desk. ? ?Wear comfortable clothing and clothing appropriate for easy access to any Portacath or PICC line.  ? ?We strive to give you quality time with your provider. You may need to reschedule your appointment if you arrive late (15 or more minutes).  Arriving late affects you and other patients whose appointments are after yours.  Also, if you miss three or more appointments without notifying the office, you may be dismissed from the clinic at the provider?s discretion.    ?  ?For prescription refill requests, have your pharmacy contact our office and allow 72 hours for refills to be completed.   ? ?Today you received the following: 1 liter of Normal Saline.     ?  ?To help prevent nausea and vomiting after your treatment, we encourage you to take your nausea medication as directed. ? ?BELOW ARE SYMPTOMS THAT SHOULD BE REPORTED IMMEDIATELY: ?*FEVER GREATER THAN 100.4 F (38 ?C) OR HIGHER ?*CHILLS OR SWEATING ?*NAUSEA AND VOMITING THAT IS NOT CONTROLLED WITH YOUR NAUSEA MEDICATION ?*UNUSUAL SHORTNESS OF BREATH ?*UNUSUAL BRUISING OR BLEEDING ?*URINARY PROBLEMS (pain or burning when urinating, or frequent urination) ?*BOWEL PROBLEMS (unusual diarrhea, constipation, pain near the anus) ?TENDERNESS IN MOUTH AND THROAT WITH OR WITHOUT PRESENCE OF ULCERS (sore throat, sores in mouth, or a toothache) ?UNUSUAL RASH, SWELLING OR PAIN  ?UNUSUAL VAGINAL DISCHARGE OR ITCHING  ? ?Items with * indicate a potential emergency and should be followed up as soon as possible or go to the Emergency Department if any problems should occur. ? ?Please show the CHEMOTHERAPY ALERT CARD or IMMUNOTHERAPY ALERT CARD at check-in to the Emergency Department and triage  nurse. ? ?Should you have questions after your visit or need to cancel or reschedule your appointment, please contact [facility] [phone]  and follow the prompts.  Office hours are 8:00 a.m. to 4:30 p.m. Monday - Friday. Please note that voicemails left after 4:00 p.m. may not be returned until the following business day.  We are closed weekends and major holidays. You have access to a nurse at all times for urgent questions. Please call the main number to the clinic [phone] and follow the prompts. ? ?For any non-urgent questions, you may also contact your provider using MyChart. We now offer e-Visits for anyone 58 and older to request care online for non-urgent symptoms. For details visit [email or web address]. ?  ?Also download the MyChart app! Go to the app store, search "MyChart", open the app, select [facility], and log in with your MyChart username and password. ? ?Due to Covid, a mask is required upon entering the hospital/clinic. If you do not have a mask, one will be given to you upon arrival. For doctor visits, patients may have 1 support person aged 60 or older with them. For treatment visits, patients cannot have anyone with them due to current Covid guidelines and our immunocompromised population.  ?

## 2021-11-08 NOTE — Progress Notes (Signed)
Patient presents today for 1 liter of normal saline over 2 hours and a urine sample. Patient has no complaints at this time. ? ?1 liter of normal saline given today per MD orders. Tolerated infusion without adverse affects. Vital signs stable. No complaints at this time. Discharged from clinic by wheel chair in stable condition. Alert and oriented x 3. F/U with [facility] as scheduled.    ?

## 2021-11-08 NOTE — Procedures (Signed)
PreOperative Dx: Malignant RIGHT pleural effusion ?Postoperative Dx: Malignant RIGHT pleural effusion ?Procedure:   US guided RIGHT thoracentesis ?Radiologist:  Thornton Papas ?Anesthesia:  10 ml of 1% lidocaine ?Specimen:  1.65 L of dark yellow colored fluid ?EBL:   < 1 ml ?Complications: None   ?

## 2021-11-08 NOTE — Progress Notes (Signed)
PT tolerated right sided thoracentesis procedure well today and 1650 ML of dark yellow cloudy fluid removed. Chest xray completed post procedure and read by radiologist. PT pushed to waiting area at this time via wheelchair to family that was waiting with no acute distress noted. PT verbalized understanding of discharge instructions.  ?

## 2021-11-10 LAB — ACID FAST CULTURE WITH REFLEXED SENSITIVITIES (MYCOBACTERIA): Acid Fast Culture: NEGATIVE

## 2021-11-13 ENCOUNTER — Other Ambulatory Visit (HOSPITAL_COMMUNITY): Payer: Medicare PPO

## 2021-11-13 ENCOUNTER — Ambulatory Visit (HOSPITAL_COMMUNITY): Payer: Medicare PPO | Admitting: Hematology

## 2021-11-13 ENCOUNTER — Ambulatory Visit (HOSPITAL_COMMUNITY): Payer: Medicare PPO

## 2021-11-14 ENCOUNTER — Telehealth (HOSPITAL_COMMUNITY): Payer: Self-pay | Admitting: Hematology

## 2021-11-14 NOTE — Telephone Encounter (Signed)
Spoke with pts son regarding the [employer]. Pt will bring in fin doc on her next visit. ?

## 2021-11-15 ENCOUNTER — Ambulatory Visit (HOSPITAL_COMMUNITY): Payer: Medicare PPO

## 2021-11-20 ENCOUNTER — Inpatient Hospital Stay (HOSPITAL_BASED_OUTPATIENT_CLINIC_OR_DEPARTMENT_OTHER): Payer: Medicare PPO | Admitting: Hematology

## 2021-11-20 ENCOUNTER — Inpatient Hospital Stay (HOSPITAL_COMMUNITY): Payer: Medicare PPO

## 2021-11-20 ENCOUNTER — Other Ambulatory Visit: Payer: Self-pay

## 2021-11-20 ENCOUNTER — Encounter: Payer: Self-pay | Admitting: Dietician

## 2021-11-20 VITALS — BP 131/64 | HR 71 | Temp 97.3°F | Resp 18

## 2021-11-20 VITALS — BP 156/74 | HR 63 | Temp 96.2°F | Resp 18 | Ht 66.14 in | Wt 164.6 lb

## 2021-11-20 DIAGNOSIS — C8510 Unspecified B-cell lymphoma, unspecified site: Secondary | ICD-10-CM

## 2021-11-20 DIAGNOSIS — Z5112 Encounter for antineoplastic immunotherapy: Secondary | ICD-10-CM | POA: Diagnosis not present

## 2021-11-20 DIAGNOSIS — Z5189 Encounter for other specified aftercare: Secondary | ICD-10-CM | POA: Diagnosis not present

## 2021-11-20 DIAGNOSIS — R350 Frequency of micturition: Secondary | ICD-10-CM | POA: Diagnosis not present

## 2021-11-20 DIAGNOSIS — R61 Generalized hyperhidrosis: Secondary | ICD-10-CM | POA: Diagnosis not present

## 2021-11-20 DIAGNOSIS — C8330 Diffuse large B-cell lymphoma, unspecified site: Secondary | ICD-10-CM | POA: Diagnosis not present

## 2021-11-20 DIAGNOSIS — Z95828 Presence of other vascular implants and grafts: Secondary | ICD-10-CM

## 2021-11-20 DIAGNOSIS — K5900 Constipation, unspecified: Secondary | ICD-10-CM | POA: Diagnosis not present

## 2021-11-20 DIAGNOSIS — C8338 Diffuse large B-cell lymphoma, lymph nodes of multiple sites: Secondary | ICD-10-CM

## 2021-11-20 DIAGNOSIS — Z79899 Other long term (current) drug therapy: Secondary | ICD-10-CM | POA: Diagnosis not present

## 2021-11-20 DIAGNOSIS — Z5111 Encounter for antineoplastic chemotherapy: Secondary | ICD-10-CM | POA: Diagnosis not present

## 2021-11-20 DIAGNOSIS — E860 Dehydration: Secondary | ICD-10-CM | POA: Diagnosis not present

## 2021-11-20 LAB — COMPREHENSIVE METABOLIC PANEL
ALT: 24 U/L (ref 0–44)
AST: 23 U/L (ref 15–41)
Albumin: 3.4 g/dL — ABNORMAL LOW (ref 3.5–5.0)
Alkaline Phosphatase: 72 U/L (ref 38–126)
Anion gap: 3 — ABNORMAL LOW (ref 5–15)
BUN: 8 mg/dL (ref 8–23)
CO2: 29 mmol/L (ref 22–32)
Calcium: 8.5 mg/dL — ABNORMAL LOW (ref 8.9–10.3)
Chloride: 100 mmol/L (ref 98–111)
Creatinine, Ser: 0.57 mg/dL (ref 0.44–1.00)
GFR, Estimated: >60 mL/min (ref >60)
Glucose, Bld: 87 mg/dL (ref 70–99)
Potassium: 3.6 mmol/L (ref 3.5–5.1)
Sodium: 132 mmol/L — ABNORMAL LOW (ref 135–145)
Total Bilirubin: 0.5 mg/dL (ref 0.3–1.2)
Total Protein: 6 g/dL — ABNORMAL LOW (ref 6.5–8.1)

## 2021-11-20 LAB — LACTATE DEHYDROGENASE: LDH: 232 U/L — ABNORMAL HIGH (ref 98–192)

## 2021-11-20 LAB — CBC WITH DIFFERENTIAL/PLATELET
Abs Immature Granulocytes: 0.29 10*3/uL — ABNORMAL HIGH (ref 0.00–0.07)
Basophils Absolute: 0.1 10*3/uL (ref 0.0–0.1)
Basophils Relative: 2 %
Eosinophils Absolute: 0 10*3/uL (ref 0.0–0.5)
Eosinophils Relative: 0 %
HCT: 38.8 % (ref 36.0–46.0)
Hemoglobin: 12.6 g/dL (ref 12.0–15.0)
Immature Granulocytes: 5 %
Lymphocytes Relative: 15 %
Lymphs Abs: 0.9 10*3/uL (ref 0.7–4.0)
MCH: 29.9 pg (ref 26.0–34.0)
MCHC: 32.5 g/dL (ref 30.0–36.0)
MCV: 92.2 fL (ref 80.0–100.0)
Monocytes Absolute: 0.9 10*3/uL (ref 0.1–1.0)
Monocytes Relative: 15 %
Neutro Abs: 3.8 10*3/uL (ref 1.7–7.7)
Neutrophils Relative %: 63 %
Platelets: 150 10*3/uL (ref 150–400)
RBC: 4.21 MIL/uL (ref 3.87–5.11)
RDW: 15.6 % — ABNORMAL HIGH (ref 11.5–15.5)
WBC: 6 10*3/uL (ref 4.0–10.5)
nRBC: 0 % (ref 0.0–0.2)

## 2021-11-20 LAB — PHOSPHORUS: Phosphorus: 3 mg/dL (ref 2.5–4.6)

## 2021-11-20 LAB — URIC ACID: Uric Acid, Serum: 2 mg/dL — ABNORMAL LOW (ref 2.5–7.1)

## 2021-11-20 LAB — MAGNESIUM: Magnesium: 1.9 mg/dL (ref 1.7–2.4)

## 2021-11-20 MED ORDER — ACETAMINOPHEN 325 MG PO TABS
650.0000 mg | ORAL_TABLET | Freq: Once | ORAL | Status: AC
  Administered 2021-11-20: 650 mg via ORAL
  Filled 2021-11-20: qty 2

## 2021-11-20 MED ORDER — SODIUM CHLORIDE 0.9 % IV SOLN
10.0000 mg | Freq: Once | INTRAVENOUS | Status: AC
  Administered 2021-11-20: 10 mg via INTRAVENOUS
  Filled 2021-11-20: qty 10

## 2021-11-20 MED ORDER — SODIUM CHLORIDE 0.9% FLUSH
10.0000 mL | INTRAVENOUS | Status: DC | PRN
  Administered 2021-11-20: 10 mL

## 2021-11-20 MED ORDER — DOXORUBICIN HCL CHEMO IV INJECTION 2 MG/ML
25.0000 mg/m2 | Freq: Once | INTRAVENOUS | Status: AC
  Administered 2021-11-20: 48 mg via INTRAVENOUS
  Filled 2021-11-20: qty 24

## 2021-11-20 MED ORDER — SODIUM CHLORIDE 0.9 % IV SOLN
375.0000 mg/m2 | Freq: Once | INTRAVENOUS | Status: AC
  Administered 2021-11-20: 700 mg via INTRAVENOUS
  Filled 2021-11-20: qty 20

## 2021-11-20 MED ORDER — DIPHENHYDRAMINE HCL 25 MG PO CAPS
50.0000 mg | ORAL_CAPSULE | Freq: Once | ORAL | Status: AC
  Administered 2021-11-20: 50 mg via ORAL
  Filled 2021-11-20: qty 2

## 2021-11-20 MED ORDER — HEPARIN SOD (PORK) LOCK FLUSH 100 UNIT/ML IV SOLN
500.0000 [IU] | Freq: Once | INTRAVENOUS | Status: AC | PRN
  Administered 2021-11-20: 500 [IU]

## 2021-11-20 MED ORDER — FAMOTIDINE IN NACL 20-0.9 MG/50ML-% IV SOLN
20.0000 mg | Freq: Once | INTRAVENOUS | Status: AC
  Administered 2021-11-20: 20 mg via INTRAVENOUS
  Filled 2021-11-20: qty 50

## 2021-11-20 MED ORDER — VINCRISTINE SULFATE CHEMO INJECTION 1 MG/ML
1.0000 mg | Freq: Once | INTRAVENOUS | Status: AC
  Administered 2021-11-20: 1 mg via INTRAVENOUS
  Filled 2021-11-20: qty 1

## 2021-11-20 MED ORDER — SODIUM CHLORIDE 0.9 % IV SOLN
750.0000 mg/m2 | Freq: Once | INTRAVENOUS | Status: AC
  Administered 2021-11-20: 1440 mg via INTRAVENOUS
  Filled 2021-11-20: qty 72

## 2021-11-20 MED ORDER — SODIUM CHLORIDE 0.9 % IV SOLN
150.0000 mg | Freq: Once | INTRAVENOUS | Status: AC
  Administered 2021-11-20: 150 mg via INTRAVENOUS
  Filled 2021-11-20: qty 150

## 2021-11-20 MED ORDER — SODIUM CHLORIDE 0.9 % IV SOLN: Freq: Once | INTRAVENOUS | Status: AC

## 2021-11-20 MED ORDER — PALONOSETRON HCL INJECTION 0.25 MG/5ML
0.2500 mg | Freq: Once | INTRAVENOUS | Status: AC
  Administered 2021-11-20: 0.25 mg via INTRAVENOUS
  Filled 2021-11-20: qty 5

## 2021-11-20 NOTE — Progress Notes (Signed)
One complimentary case of Ensure Plus High Protein provided. ?

## 2021-11-20 NOTE — Progress Notes (Signed)
Patient presents today for RCHOP infusion per providers order.  Vital signs and labs within parameters for treatment.   ? ?Message received from [facility] patient is okay for treatment.  ? ?RCHOP infusion given today per MD orders.  Stable during infusion without adverse affects.  Vital signs stable.  No complaints at this time.  Discharge from clinic ambulatory in stable condition.  Alert and oriented X 3.  Follow up with [facility] as scheduled.  ?

## 2021-11-20 NOTE — Progress Notes (Signed)
Patients port flushed without difficulty.  Good blood return noted with no bruising or swelling noted at site.  Stable during access and blood draw.  Patient to remain accessed for treatment. 

## 2021-11-20 NOTE — Patient Instructions (Signed)
[facility] at [facility] ?Discharge Instructions ? ? ?You were seen and examined today by Dr. Delton Coombes. ? ?He reviewed the results of your lab work which are normal/stable. Your lab work indicative of how the lymphoma is doing have greatly improved. ? ?We will proceed with your second cycle of treatment today. ? ?Return as scheduled.  ? ? ?Thank you for choosing [facility] at [facility] to provide your oncology and hematology care.  To afford each patient quality time with our provider, please arrive at least 15 minutes before your scheduled appointment time.  ? ?If you have a lab appointment with the [facility] please come in thru the Main Entrance and check in at the main information desk. ? ?You need to re-schedule your appointment should you arrive 10 or more minutes late.  We strive to give you quality time with our providers, and arriving late affects you and other patients whose appointments are after yours.  Also, if you no show three or more times for appointments you may be dismissed from the clinic at the providers discretion.     ?Again, thank you for choosing [facility].  Our hope is that these requests will decrease the amount of time that you wait before being seen by our physicians.       ?_____________________________________________________________ ? ?Should you have questions after your visit to [facility], please contact our office at [phone] and follow the prompts.  Our office hours are 8:00 a.m. and 4:30 p.m. Monday - Friday.  Please note that voicemails left after 4:00 p.m. may not be returned until the following business day.  We are closed weekends and major holidays.  You do have access to a nurse 24-7, just call the main number to the clinic [phone] and do not press any options, hold on the line and a nurse will answer the phone.   ? ?For prescription refill requests, have your pharmacy contact our  office and allow 72 hours.   ? ?Due to Covid, you will need to wear a mask upon entering the hospital. If you do not have a mask, a mask will be given to you at the Main Entrance upon arrival. For doctor visits, patients may have 1 support person age 69 or older with them. For treatment visits, patients can not have anyone with them due to social distancing guidelines and our immunocompromised population.  ? ?   ?

## 2021-11-20 NOTE — Patient Instructions (Signed)
[facility]  Discharge Instructions: ?Thank you for choosing [facility] to provide your oncology and hematology care.  ?If you have a lab appointment with the [facility], please come in thru the Main Entrance and check in at the main information desk. ? ?Wear comfortable clothing and clothing appropriate for easy access to any Portacath or PICC line.  ? ?We strive to give you quality time with your provider. You may need to reschedule your appointment if you arrive late (15 or more minutes).  Arriving late affects you and other patients whose appointments are after yours.  Also, if you miss three or more appointments without notifying the office, you may be dismissed from the clinic at the provider?s discretion.    ?  ?For prescription refill requests, have your pharmacy contact our office and allow 72 hours for refills to be completed.   ? ?Today you received the following chemotherapy and/or immunotherapy agents RCHOP    ?  ?To help prevent nausea and vomiting after your treatment, we encourage you to take your nausea medication as directed. ? ?BELOW ARE SYMPTOMS THAT SHOULD BE REPORTED IMMEDIATELY: ?*FEVER GREATER THAN 100.4 F (38 ?C) OR HIGHER ?*CHILLS OR SWEATING ?*NAUSEA AND VOMITING THAT IS NOT CONTROLLED WITH YOUR NAUSEA MEDICATION ?*UNUSUAL SHORTNESS OF BREATH ?*UNUSUAL BRUISING OR BLEEDING ?*URINARY PROBLEMS (pain or burning when urinating, or frequent urination) ?*BOWEL PROBLEMS (unusual diarrhea, constipation, pain near the anus) ?TENDERNESS IN MOUTH AND THROAT WITH OR WITHOUT PRESENCE OF ULCERS (sore throat, sores in mouth, or a toothache) ?UNUSUAL RASH, SWELLING OR PAIN  ?UNUSUAL VAGINAL DISCHARGE OR ITCHING  ? ?Items with * indicate a potential emergency and should be followed up as soon as possible or go to the Emergency Department if any problems should occur. ? ?Please show the CHEMOTHERAPY ALERT CARD or IMMUNOTHERAPY ALERT CARD at check-in to the Emergency Department  and triage nurse. ? ?Should you have questions after your visit or need to cancel or reschedule your appointment, please contact [facility] [phone]  and follow the prompts.  Office hours are 8:00 a.m. to 4:30 p.m. Monday - Friday. Please note that voicemails left after 4:00 p.m. may not be returned until the following business day.  We are closed weekends and major holidays. You have access to a nurse at all times for urgent questions. Please call the main number to the clinic [phone] and follow the prompts. ? ?For any non-urgent questions, you may also contact your provider using MyChart. We now offer e-Visits for anyone 23 and older to request care online for non-urgent symptoms. For details visit [email or web address]. ?  ?Also download the MyChart app! Go to the app store, search "MyChart", open the app, select [facility], and log in with your MyChart username and password. ? ?Due to Covid, a mask is required upon entering the hospital/clinic. If you do not have a mask, one will be given to you upon arrival. For doctor visits, patients may have 1 support person aged 43 or older with them. For treatment visits, patients cannot have anyone with them due to current Covid guidelines and our immunocompromised population.  ?

## 2021-11-20 NOTE — Progress Notes (Signed)
Patient has been examined by Dr. Katragadda, and vital signs and labs have been reviewed. ANC, Creatinine, LFTs, hemoglobin, and platelets are within treatment parameters per M.D. - pt may proceed with treatment.    °

## 2021-11-20 NOTE — Progress Notes (Signed)
? ?[facility] ?[address] ?[place], [place] [address] ? ? ?CLINIC:  ?Medical Oncology/Hematology ? ?PCP:  ?Molly Rival, FNP ?[address] / [place] [address] ?[phone] ? ? ?REASON FOR VISIT:  ?Follow-up for large B-cell lymphoma ? ?PRIOR THERAPY: none ? ?NGS Results: not done ? ?CURRENT THERAPY: R-CHOP q21d ? ?BRIEF ONCOLOGIC HISTORY:  ?Oncology History  ?Diffuse large B-cell lymphoma ([place])  ?10/18/2021 Initial Diagnosis  ? Diffuse large B-cell lymphoma ([facility]) ?  ?10/30/2021 -  Chemotherapy  ? Patient is on Treatment Plan : NON-HODGKINS LYMPHOMA R-CHOP q21d  ?   ? ? ?CANCER STAGING: ? Cancer Staging  ?Diffuse large B-cell lymphoma ([place]) ?Staging form: Hodgkin and Non-Hodgkin Lymphoma, AJCC 8th Edition ?- Clinical stage from 10/22/2021: Stage IV (Diffuse large B-cell lymphoma) - Unsigned ? ? ?INTERVAL HISTORY:  ?[name], a 76 y.o. female, returns for routine follow-up and consideration for next cycle of chemotherapy. Molly Patel was last seen on 10/22/2021. ? ?Due for cycle #2 of R-CHOP today.  ? ?Overall, she tells me she has been feeling pretty well. She reports she is sleeping well, and she has not required trazodone. She is eating well. Her weight is stable. She denies right sided CP, and she has not required hydrocodone in 3 weeks. She denies tingling/numbness. She denies cardiac issues, CVA, and MI. She denies increased sweating during her last treatment. She reports constipation for which she is taking Colace and Miralax. She denies incontinence, but reports urinary frequency at night.  ? ?Overall, she feels ready for next cycle of chemo today.  ? ?REVIEW OF SYSTEMS:  ?Review of Systems  ?Constitutional:  Negative for appetite change, fatigue and unexpected weight change.  ?Cardiovascular:  Negative for chest pain.  ?Gastrointestinal:  Positive for constipation.  ?Genitourinary:  Positive for frequency. Negative for bladder incontinence.   ?Neurological:   Negative for numbness.  ?All other systems reviewed and are negative. ? ?PAST MEDICAL/SURGICAL HISTORY:  ?Past Medical History:  ?Diagnosis Date  ? Angioedema   ? B-cell lymphoma ([place])   ? Depression   ? Dizziness   ? Echocardiogram with ECG monitoring 02/23/2011  ? mild LVH, normal event monitor   ? Hyperlipidemia   ? Hypertension   ? takes meds daily  ? Incontinence   ? Insomnia   ? Ovarian cyst   ? Port-A-Cath in place 10/29/2021  ? Seizures ([place])   ? 2 weeks ago  ? Stroke ([place]) 08/25/2014  ? Syncope   ? Vitamin D deficiency   ? ?Past Surgical History:  ?Procedure Laterality Date  ? ABDOMINAL HYSTERECTOMY    ? COLONOSCOPY N/A 11/02/2014  ? Procedure: COLONOSCOPY;  Surgeon: Rogene Houston, MD;  [facility];  Service: Endoscopy;  Laterality: N/A;  140 - moved to 12:55 - Ann to notify pt  ? CRANIOTOMY  06/29/2012  ? Procedure: CRANIOTOMY TUMOR EXCISION;  Surgeon: Faythe Ghee, MD;  Location: [facility];  Service: Neurosurgery;  Laterality: Left;  Left frontal Craniotomy for Meningioma  ? ESOPHAGEAL DILATION N/A 11/02/2014  ? Procedure: ESOPHAGEAL DILATION;  Surgeon: Rogene Houston, MD;  [facility];  Service: Endoscopy;  Laterality: N/A;  ? ESOPHAGOGASTRODUODENOSCOPY N/A 11/02/2014  ? Procedure: ESOPHAGOGASTRODUODENOSCOPY (EGD);  Surgeon: Rogene Houston, MD;  [facility];  Service: Endoscopy;  Laterality: N/A;  ? PORTACATH PLACEMENT Left 10/18/2021  ? Procedure: INSERTION PORT-A-CATH;  Surgeon: Aviva Signs, MD;  Location: [facility]S;  Service:  General;  Laterality: Left;  ? VESICOVAGINAL FISTULA CLOSURE W/ TAH    ? ? ?SOCIAL HISTORY:  ?Social History  ? ?Socioeconomic History  ? Marital status: Divorced  ?  Spouse name: Not on file  ? Number of children: 2  ? Years of education: Not on file  ? Highest education level: Not on file  ?Occupational History  ? Occupation: Retired  ?  Comment: [employer]  ?Tobacco Use  ? Smoking status: Former  ?  Years: 7.00  ?  Types: Cigarettes   ?  Quit date: 08/25/1985  ?  Years since quitting: 36.2  ? Smokeless tobacco: Never  ?Vaping Use  ? Vaping Use: Never used  ?Substance and Sexual Activity  ? Alcohol use: No  ? Drug use: No  ? Sexual activity: Not on file  ?Other Topics Concern  ? Not on file  ?Social History Narrative  ? Not on file  ? ?Social Determinants of Health  ? ?Financial Resource Strain: Not on file  ?Food Insecurity: Not on file  ?Transportation Needs: Not on file  ?Physical Activity: Not on file  ?Stress: Not on file  ?Social Connections: Not on file  ?Intimate Partner Violence: Not on file  ? ? ?FAMILY HISTORY:  ?Family History  ?Problem Relation Age of Onset  ? Stroke Father   ? Asthma Grandchild   ? Arthritis Sister   ? Diabetes Sister   ? Colon cancer Brother   ? ? ?CURRENT MEDICATIONS:  ?Current Outpatient Medications  ?Medication Sig Dispense Refill  ? allopurinol (ZYLOPRIM) 300 MG tablet Take 1 tablet (300 mg total) by mouth daily. 30 tablet 2  ? aspirin EC 325 MG tablet Take 1 tablet (325 mg total) by mouth daily. 30 tablet 0  ? atenolol (TENORMIN) 50 MG tablet TAKE 1 TABLET EVERY DAY (Patient taking differently: Take 50 mg by mouth daily.) 90 tablet 2  ? cholecalciferol (VITAMIN D) 25 MCG (1000 UNIT) tablet TAKE 1 TABLET EVERY DAY (Patient taking differently: 1,000 Units daily.) 90 tablet 3  ? CYCLOPHOSPHAMIDE IV Inject into the vein every 21 ( twenty-one) days.    ? donepezil (ARICEPT) 5 MG tablet TAKE 1 TABLET AT BEDTIME (Patient taking differently: Take 5 mg by mouth at bedtime.) 90 tablet 1  ? DOXOrubicin HCl (ADRIAMYCIN IV) Inject into the vein every 21 ( twenty-one) days.    ? hydrochlorothiazide (MICROZIDE) 12.5 MG capsule TAKE 1 CAPSULE EVERY DAY (Patient taking differently: Take 12.5 mg by mouth daily.) 90 capsule 0  ? HYDROcodone-acetaminophen (NORCO/VICODIN) 5-325 MG tablet Take 1 tablet by mouth every 6 (six) hours as needed for moderate pain or severe pain. 20 tablet 0  ? Lactulose 20 GM/30ML SOLN Take 30 mLs (20 g  total) by mouth 3 (three) times daily as needed. 450 mL 0  ? levETIRAcetam (KEPPRA) 500 MG tablet Take 1 tablet (500 mg total) by mouth 2 (two) times daily. Needs to establish with new PCP.  Dr. Buelah Manis is no longer at this office.  Must be seen before more refills. 60 tablet 0  ? lidocaine-prilocaine (EMLA) cream Apply a small amount to port a cath site and cover with plastic wrap 1 hour prior to infusion appointments 30 g 3  ? memantine (NAMENDA) 5 MG tablet Take 5 mg by mouth daily.    ? predniSONE (DELTASONE) 20 MG tablet Take 5 tablets (100 mg) by mouth daily for 5 days starting on the first day of each treatment.  DO NOT take on  the weeks you are not receiving chemotherapy treatment. 30 tablet 0  ? prochlorperazine (COMPAZINE) 10 MG tablet Take 1 tablet (10 mg total) by mouth every 6 (six) hours as needed (Nausea or vomiting). 30 tablet 6  ? riTUXimab (RITUXAN IV) Inject into the vein every 21 ( twenty-one) days.    ? rosuvastatin (CRESTOR) 20 MG tablet Take 1 tablet (20 mg total) by mouth once a week. Thursday 30 tablet 1  ? traZODone (DESYREL) 50 MG tablet Take 0.5-1 tablets (25-50 mg total) by mouth at bedtime as needed for sleep. 30 tablet 0  ? VINCRISTINE SULFATE IV Inject into the vein every 21 ( twenty-one) days.    ? ?No current facility-administered medications for this visit.  ? ? ?ALLERGIES:  ?No Known Allergies ? ?PHYSICAL EXAM:  ?Performance status (ECOG): 2 - Symptomatic, <50% confined to bed ? ?Vitals:  ? 11/20/21 0951  ?BP: (!) 156/74  ?Pulse: 63  ?Resp: 18  ?Temp: (!) 96.2 ?F (35.7 ?C)  ?SpO2: 100%  ? ?Wt Readings from Last 3 Encounters:  ?11/20/21 164 lb 9.6 oz (74.7 kg)  ?11/06/21 168 lb 6.9 oz (76.4 kg)  ?10/25/21 174 lb (78.9 kg)  ? ?Physical Exam ?Vitals reviewed.  ?Constitutional:   ?   Appearance: Normal appearance.  ?Cardiovascular:  ?   Rate and Rhythm: Normal rate and regular rhythm.  ?   Pulses: Normal pulses.  ?   Heart sounds: Normal heart sounds.  ?Pulmonary:  ?   Effort:  Pulmonary effort is normal.  ?   Breath sounds: Normal breath sounds.  ?Musculoskeletal:  ?   Right lower leg: No edema.  ?   Left lower leg: No edema.  ?Neurological:  ?   General: No focal deficit present.  ?

## 2021-11-22 ENCOUNTER — Inpatient Hospital Stay (HOSPITAL_COMMUNITY): Payer: Medicare PPO

## 2021-11-22 VITALS — BP 158/77 | HR 62 | Temp 97.3°F | Resp 20

## 2021-11-22 DIAGNOSIS — C8338 Diffuse large B-cell lymphoma, lymph nodes of multiple sites: Secondary | ICD-10-CM

## 2021-11-22 DIAGNOSIS — Z5111 Encounter for antineoplastic chemotherapy: Secondary | ICD-10-CM | POA: Diagnosis not present

## 2021-11-22 DIAGNOSIS — Z5189 Encounter for other specified aftercare: Secondary | ICD-10-CM | POA: Diagnosis not present

## 2021-11-22 DIAGNOSIS — K5900 Constipation, unspecified: Secondary | ICD-10-CM | POA: Diagnosis not present

## 2021-11-22 DIAGNOSIS — Z5112 Encounter for antineoplastic immunotherapy: Secondary | ICD-10-CM | POA: Diagnosis not present

## 2021-11-22 DIAGNOSIS — E860 Dehydration: Secondary | ICD-10-CM | POA: Diagnosis not present

## 2021-11-22 DIAGNOSIS — C8330 Diffuse large B-cell lymphoma, unspecified site: Secondary | ICD-10-CM | POA: Diagnosis not present

## 2021-11-22 DIAGNOSIS — Z95828 Presence of other vascular implants and grafts: Secondary | ICD-10-CM

## 2021-11-22 DIAGNOSIS — R61 Generalized hyperhidrosis: Secondary | ICD-10-CM | POA: Diagnosis not present

## 2021-11-22 DIAGNOSIS — R350 Frequency of micturition: Secondary | ICD-10-CM | POA: Diagnosis not present

## 2021-11-22 DIAGNOSIS — Z79899 Other long term (current) drug therapy: Secondary | ICD-10-CM | POA: Diagnosis not present

## 2021-11-22 MED ORDER — PEGFILGRASTIM-CBQV 6 MG/0.6ML ~~LOC~~ SOSY
6.0000 mg | PREFILLED_SYRINGE | Freq: Once | SUBCUTANEOUS | Status: AC
  Administered 2021-11-22: 6 mg via SUBCUTANEOUS
  Filled 2021-11-22: qty 0.6

## 2021-11-22 NOTE — Patient Instructions (Signed)
[facility]  Discharge Instructions: Thank you for choosing [facility] to provide your oncology and hematology care.  If you have a lab appointment with the Cancer Center, please come in thru the Main Entrance and check in at the main information desk.  Wear comfortable clothing and clothing appropriate for easy access to any Portacath or PICC line.   We strive to give you quality time with your provider. You may need to reschedule your appointment if you arrive late (15 or more minutes).  Arriving late affects you and other patients whose appointments are after yours.  Also, if you miss three or more appointments without notifying the office, you may be dismissed from the clinic at the provider's discretion.      For prescription refill requests, have your pharmacy contact our office and allow 72 hours for refills to be completed.        To help prevent nausea and vomiting after your treatment, we encourage you to take your nausea medication as directed.  BELOW ARE SYMPTOMS THAT SHOULD BE REPORTED IMMEDIATELY: *FEVER GREATER THAN 100.4 F (38 C) OR HIGHER *CHILLS OR SWEATING *NAUSEA AND VOMITING THAT IS NOT CONTROLLED WITH YOUR NAUSEA MEDICATION *UNUSUAL SHORTNESS OF BREATH *UNUSUAL BRUISING OR BLEEDING *URINARY PROBLEMS (pain or burning when urinating, or frequent urination) *BOWEL PROBLEMS (unusual diarrhea, constipation, pain near the anus) TENDERNESS IN MOUTH AND THROAT WITH OR WITHOUT PRESENCE OF ULCERS (sore throat, sores in mouth, or a toothache) UNUSUAL RASH, SWELLING OR PAIN  UNUSUAL VAGINAL DISCHARGE OR ITCHING   Items with * indicate a potential emergency and should be followed up as soon as possible or go to the Emergency Department if any problems should occur.  Please show the CHEMOTHERAPY ALERT CARD or IMMUNOTHERAPY ALERT CARD at check-in to the Emergency Department and triage nurse.  Should you have questions after your visit or need to cancel  or reschedule your appointment, please contact [facility] [phone]  and follow the prompts.  Office hours are 8:00 a.m. to 4:30 p.m. Monday - Friday. Please note that voicemails left after 4:00 p.m. may not be returned until the following business day.  We are closed weekends and major holidays. You have access to a nurse at all times for urgent questions. Please call the main number to the clinic [phone] and follow the prompts.  For any non-urgent questions, you may also contact your provider using MyChart. We now offer e-Visits for anyone 18 and older to request care online for non-urgent symptoms. For details visit [email or web address].   Also download the MyChart app! Go to the app store, search "MyChart", open the app, select [facility], and log in with your MyChart username and password.  Due to Covid, a mask is required upon entering the hospital/clinic. If you do not have a mask, one will be given to you upon arrival. For doctor visits, patients may have 1 support person aged 18 or older with them. For treatment visits, patients cannot have anyone with them due to current Covid guidelines and our immunocompromised population.  

## 2021-11-22 NOTE — Progress Notes (Signed)
Patient presents today for Udenyca injection.  Patient is in satisfactory condition with no complaints voiced.  Vital signs are stable.  We will proceed with injection per MD orders.  ? ?Patient tolerated injection well with no complaints voiced.  Patient left ambulatory in stable condition.  Vital signs stable at discharge.  Follow up as scheduled.    ?

## 2021-11-28 ENCOUNTER — Telehealth (HOSPITAL_COMMUNITY): Payer: Self-pay | Admitting: Dietician

## 2021-11-28 NOTE — Telephone Encounter (Signed)
Unable to contact patient after two attempts today. Patient VM is full, unable to leave message.  ?

## 2021-12-09 ENCOUNTER — Other Ambulatory Visit: Payer: Self-pay | Admitting: Family Medicine

## 2021-12-11 ENCOUNTER — Inpatient Hospital Stay (HOSPITAL_COMMUNITY): Payer: Medicare PPO | Admitting: Hematology

## 2021-12-11 ENCOUNTER — Inpatient Hospital Stay (HOSPITAL_COMMUNITY): Payer: Medicare PPO | Attending: Hematology

## 2021-12-11 ENCOUNTER — Inpatient Hospital Stay (HOSPITAL_COMMUNITY): Payer: Medicare PPO

## 2021-12-11 ENCOUNTER — Encounter (HOSPITAL_COMMUNITY): Payer: Self-pay | Admitting: Hematology

## 2021-12-11 VITALS — BP 125/70 | HR 67 | Temp 97.5°F | Resp 18

## 2021-12-11 DIAGNOSIS — C8330 Diffuse large B-cell lymphoma, unspecified site: Secondary | ICD-10-CM | POA: Diagnosis not present

## 2021-12-11 DIAGNOSIS — Z5189 Encounter for other specified aftercare: Secondary | ICD-10-CM | POA: Insufficient documentation

## 2021-12-11 DIAGNOSIS — C8338 Diffuse large B-cell lymphoma, lymph nodes of multiple sites: Secondary | ICD-10-CM

## 2021-12-11 DIAGNOSIS — J90 Pleural effusion, not elsewhere classified: Secondary | ICD-10-CM | POA: Insufficient documentation

## 2021-12-11 DIAGNOSIS — Z87891 Personal history of nicotine dependence: Secondary | ICD-10-CM | POA: Diagnosis not present

## 2021-12-11 DIAGNOSIS — Z801 Family history of malignant neoplasm of trachea, bronchus and lung: Secondary | ICD-10-CM | POA: Insufficient documentation

## 2021-12-11 DIAGNOSIS — Z800 Family history of malignant neoplasm of digestive organs: Secondary | ICD-10-CM | POA: Diagnosis not present

## 2021-12-11 DIAGNOSIS — C8510 Unspecified B-cell lymphoma, unspecified site: Secondary | ICD-10-CM

## 2021-12-11 DIAGNOSIS — Z5112 Encounter for antineoplastic immunotherapy: Secondary | ICD-10-CM | POA: Insufficient documentation

## 2021-12-11 DIAGNOSIS — Z5111 Encounter for antineoplastic chemotherapy: Secondary | ICD-10-CM | POA: Diagnosis not present

## 2021-12-11 DIAGNOSIS — Z79899 Other long term (current) drug therapy: Secondary | ICD-10-CM | POA: Insufficient documentation

## 2021-12-11 DIAGNOSIS — Z95828 Presence of other vascular implants and grafts: Secondary | ICD-10-CM

## 2021-12-11 DIAGNOSIS — Z7982 Long term (current) use of aspirin: Secondary | ICD-10-CM | POA: Insufficient documentation

## 2021-12-11 LAB — CBC WITH DIFFERENTIAL/PLATELET
Abs Immature Granulocytes: 0.31 10*3/uL — ABNORMAL HIGH (ref 0.00–0.07)
Basophils Absolute: 0.2 10*3/uL — ABNORMAL HIGH (ref 0.0–0.1)
Basophils Relative: 3 %
Eosinophils Absolute: 0.1 10*3/uL (ref 0.0–0.5)
Eosinophils Relative: 1 %
HCT: 37.8 % (ref 36.0–46.0)
Hemoglobin: 12.1 g/dL (ref 12.0–15.0)
Immature Granulocytes: 4 %
Lymphocytes Relative: 11 %
Lymphs Abs: 0.8 10*3/uL (ref 0.7–4.0)
MCH: 30.2 pg (ref 26.0–34.0)
MCHC: 32 g/dL (ref 30.0–36.0)
MCV: 94.3 fL (ref 80.0–100.0)
Monocytes Absolute: 1.2 10*3/uL — ABNORMAL HIGH (ref 0.1–1.0)
Monocytes Relative: 16 %
Neutro Abs: 4.9 10*3/uL (ref 1.7–7.7)
Neutrophils Relative %: 65 %
Platelets: 141 10*3/uL — ABNORMAL LOW (ref 150–400)
RBC: 4.01 MIL/uL (ref 3.87–5.11)
RDW: 19.4 % — ABNORMAL HIGH (ref 11.5–15.5)
WBC: 7.4 10*3/uL (ref 4.0–10.5)
nRBC: 0.5 % — ABNORMAL HIGH (ref 0.0–0.2)

## 2021-12-11 LAB — COMPREHENSIVE METABOLIC PANEL
ALT: 12 U/L (ref 0–44)
AST: 15 U/L (ref 15–41)
Albumin: 3.6 g/dL (ref 3.5–5.0)
Alkaline Phosphatase: 65 U/L (ref 38–126)
Anion gap: 7 (ref 5–15)
BUN: 14 mg/dL (ref 8–23)
CO2: 29 mmol/L (ref 22–32)
Calcium: 9 mg/dL (ref 8.9–10.3)
Chloride: 103 mmol/L (ref 98–111)
Creatinine, Ser: 0.7 mg/dL (ref 0.44–1.00)
GFR, Estimated: >60 mL/min (ref >60)
Glucose, Bld: 98 mg/dL (ref 70–99)
Potassium: 3.3 mmol/L — ABNORMAL LOW (ref 3.5–5.1)
Sodium: 139 mmol/L (ref 135–145)
Total Bilirubin: 0.4 mg/dL (ref 0.3–1.2)
Total Protein: 6.1 g/dL — ABNORMAL LOW (ref 6.5–8.1)

## 2021-12-11 LAB — URIC ACID: Uric Acid, Serum: 2.8 mg/dL (ref 2.5–7.1)

## 2021-12-11 LAB — PHOSPHORUS: Phosphorus: 3.2 mg/dL (ref 2.5–4.6)

## 2021-12-11 LAB — LACTATE DEHYDROGENASE: LDH: 209 U/L — ABNORMAL HIGH (ref 98–192)

## 2021-12-11 LAB — MAGNESIUM: Magnesium: 2.1 mg/dL (ref 1.7–2.4)

## 2021-12-11 MED ORDER — DIPHENHYDRAMINE HCL 25 MG PO CAPS
25.0000 mg | ORAL_CAPSULE | Freq: Once | ORAL | Status: AC
  Administered 2021-12-11: 25 mg via ORAL
  Filled 2021-12-11: qty 1

## 2021-12-11 MED ORDER — SODIUM CHLORIDE 0.9 % IV SOLN
150.0000 mg | Freq: Once | INTRAVENOUS | Status: AC
  Administered 2021-12-11: 150 mg via INTRAVENOUS
  Filled 2021-12-11: qty 5

## 2021-12-11 MED ORDER — DOXORUBICIN HCL CHEMO IV INJECTION 2 MG/ML
25.0000 mg/m2 | Freq: Once | INTRAVENOUS | Status: AC
  Administered 2021-12-11: 48 mg via INTRAVENOUS
  Filled 2021-12-11: qty 24

## 2021-12-11 MED ORDER — FAMOTIDINE IN NACL 20-0.9 MG/50ML-% IV SOLN
20.0000 mg | Freq: Once | INTRAVENOUS | Status: AC
  Administered 2021-12-11: 20 mg via INTRAVENOUS
  Filled 2021-12-11: qty 50

## 2021-12-11 MED ORDER — HEPARIN SOD (PORK) LOCK FLUSH 100 UNIT/ML IV SOLN
500.0000 [IU] | Freq: Once | INTRAVENOUS | Status: AC | PRN
  Administered 2021-12-11: 500 [IU]

## 2021-12-11 MED ORDER — SODIUM CHLORIDE 0.9 % IV SOLN
750.0000 mg/m2 | Freq: Once | INTRAVENOUS | Status: AC
  Administered 2021-12-11: 1440 mg via INTRAVENOUS
  Filled 2021-12-11: qty 72

## 2021-12-11 MED ORDER — VINCRISTINE SULFATE CHEMO INJECTION 1 MG/ML
1.0000 mg | Freq: Once | INTRAVENOUS | Status: AC
  Administered 2021-12-11: 1 mg via INTRAVENOUS
  Filled 2021-12-11: qty 1

## 2021-12-11 MED ORDER — ACETAMINOPHEN 325 MG PO TABS
650.0000 mg | ORAL_TABLET | Freq: Once | ORAL | Status: AC
  Administered 2021-12-11: 650 mg via ORAL
  Filled 2021-12-11: qty 2

## 2021-12-11 MED ORDER — SODIUM CHLORIDE 0.9 % IV SOLN
375.0000 mg/m2 | Freq: Once | INTRAVENOUS | Status: AC
  Administered 2021-12-11: 700 mg via INTRAVENOUS
  Filled 2021-12-11: qty 50

## 2021-12-11 MED ORDER — SODIUM CHLORIDE 0.9 % IV SOLN: Freq: Once | INTRAVENOUS | Status: AC

## 2021-12-11 MED ORDER — DIPHENHYDRAMINE HCL 25 MG PO CAPS: 50.0000 mg | ORAL_CAPSULE | Freq: Once | ORAL | Status: DC

## 2021-12-11 MED ORDER — SODIUM CHLORIDE 0.9 % IV SOLN
10.0000 mg | Freq: Once | INTRAVENOUS | Status: AC
  Administered 2021-12-11: 10 mg via INTRAVENOUS
  Filled 2021-12-11: qty 1

## 2021-12-11 MED ORDER — PALONOSETRON HCL INJECTION 0.25 MG/5ML
0.2500 mg | Freq: Once | INTRAVENOUS | Status: AC
  Administered 2021-12-11: 0.25 mg via INTRAVENOUS
  Filled 2021-12-11: qty 5

## 2021-12-11 MED ORDER — SODIUM CHLORIDE 0.9% FLUSH
10.0000 mL | INTRAVENOUS | Status: DC | PRN
  Administered 2021-12-11: 10 mL

## 2021-12-11 NOTE — Progress Notes (Signed)
? ?Molly Patel ?[address] ?[place], [place] [address] ? ? ?CLINIC:  ?Medical Oncology/Hematology ? ?PCP:  ?Molly Rival, FNP ?[address] / [place] [address] ?[phone] ? ? ?REASON FOR VISIT:  ?Follow-up for large B-cell lymphoma ? ?PRIOR THERAPY: none ? ?NGS Results: not done ? ?CURRENT THERAPY: R-CHOP q21d ? ?BRIEF ONCOLOGIC HISTORY:  ?Oncology History  ?Diffuse large B-cell lymphoma ([place])  ?10/18/2021 Initial Diagnosis  ? Diffuse large B-cell lymphoma ([place]) ? ?  ?10/30/2021 -  Chemotherapy  ? Patient is on Treatment Plan : NON-HODGKINS LYMPHOMA R-CHOP q21d  ? ?  ?  ? ? ?CANCER STAGING: ? Cancer Staging  ?Diffuse large B-cell lymphoma ([place]) ?Staging form: Hodgkin and Non-Hodgkin Lymphoma, AJCC 8th Edition ?- Clinical stage from 10/22/2021: Stage IV (Diffuse large B-cell lymphoma) - Unsigned ? ? ?INTERVAL HISTORY:  ?Molly Patel, a 76 y.o. female, returns for routine follow-up and consideration for next cycle of chemotherapy. Molly Patel was last seen on 11/20/2021. ? ?Due for cycle #3 of R-CHOP today.  ? ?Overall, she tells me she has been feeling pretty well. She denies current pain. Her sleep is still poor. She denies fatigue and n/v/d. She denies SOB and tingling/numbness. Her breathing has improved. She reports her appetite is low. She drinks Boost. She gained 4 lbs since her last visit. She denies mouth sores, falls, leg weakness, incontinence. ? ?Overall, she feels ready for next cycle of chemo today.  ? ?REVIEW OF SYSTEMS:  ?Review of Systems  ?Constitutional:  Positive for appetite change. Negative for fatigue and unexpected weight change (+4 lbs).  ?HENT:   Negative for mouth sores.   ?Respiratory:  Negative for shortness of breath.   ?Gastrointestinal:  Positive for constipation. Negative for diarrhea, nausea and vomiting.  ?Genitourinary:  Negative for bladder incontinence.   ?Neurological:  Negative for extremity weakness and numbness.   ?Psychiatric/Behavioral:  Positive for sleep disturbance.   ?All other systems reviewed and are negative. ? ?PAST MEDICAL/SURGICAL HISTORY:  ?Past Medical History:  ?Diagnosis Date  ? Angioedema   ? B-cell lymphoma ([place])   ? Depression   ? Dizziness   ? Echocardiogram with ECG monitoring 02/23/2011  ? mild LVH, normal event monitor   ? Hyperlipidemia   ? Hypertension   ? takes meds daily  ? Incontinence   ? Insomnia   ? Ovarian cyst   ? Port-A-Cath in place 10/29/2021  ? Seizures ([place])   ? 2 weeks ago  ? Stroke ([place]) 08/25/2014  ? Syncope   ? Vitamin D deficiency   ? ?Past Surgical History:  ?Procedure Laterality Date  ? ABDOMINAL HYSTERECTOMY    ? COLONOSCOPY N/A 11/02/2014  ? Procedure: COLONOSCOPY;  Surgeon: Rogene Houston, MD;  [facility];  Service: Endoscopy;  Laterality: N/A;  140 - moved to 12:55 - Ann to notify pt  ? CRANIOTOMY  06/29/2012  ? Procedure: CRANIOTOMY TUMOR EXCISION;  Surgeon: Faythe Ghee, MD;  Location: [facility];  Service: Neurosurgery;  Laterality: Left;  Left frontal Craniotomy for Meningioma  ? ESOPHAGEAL DILATION N/A 11/02/2014  ? Procedure: ESOPHAGEAL DILATION;  Surgeon: Rogene Houston, MD;  [facility];  Service: Endoscopy;  Laterality: N/A;  ? ESOPHAGOGASTRODUODENOSCOPY N/A 11/02/2014  ? Procedure: ESOPHAGOGASTRODUODENOSCOPY (EGD);  Surgeon: Rogene Houston, MD;  [facility];  Service: Endoscopy;  Laterality: N/A;  ? PORTACATH PLACEMENT Left 10/18/2021  ? Procedure: INSERTION PORT-A-CATH;  Surgeon: Aviva Signs,  MD;  Location[facility];  Service: General;  Laterality: Left;  ? VESICOVAGINAL FISTULA CLOSURE W/ TAH    ? ? ?SOCIAL HISTORY:  ?Social History  ? ?Socioeconomic History  ? Marital status: Divorced  ?  Spouse name: Not on file  ? Number of children: 2  ? Years of education: Not on file  ? Highest education level: Not on file  ?Occupational History  ? Occupation: Retired  ?  Comment: [employer]  ?Tobacco Use  ? Smoking status: Former   ?  Years: 7.00  ?  Types: Cigarettes  ?  Quit date: 08/25/1985  ?  Years since quitting: 36.3  ? Smokeless tobacco: Never  ?Vaping Use  ? Vaping Use: Never used  ?Substance and Sexual Activity  ? Alcohol use: No  ? Drug use: No  ? Sexual activity: Not on file  ?Other Topics Concern  ? Not on file  ?Social History Narrative  ? Not on file  ? ?Social Determinants of Health  ? ?Financial Resource Strain: Not on file  ?Food Insecurity: Not on file  ?Transportation Needs: Not on file  ?Physical Activity: Not on file  ?Stress: Not on file  ?Social Connections: Not on file  ?Intimate Partner Violence: Not on file  ? ? ?FAMILY HISTORY:  ?Family History  ?Problem Relation Age of Onset  ? Stroke Father   ? Asthma Grandchild   ? Arthritis Sister   ? Diabetes Sister   ? Colon cancer Brother   ? ? ?CURRENT MEDICATIONS:  ?Current Outpatient Medications  ?Medication Sig Dispense Refill  ? allopurinol (ZYLOPRIM) 300 MG tablet Take 1 tablet (300 mg total) by mouth daily. 30 tablet 2  ? aspirin EC 325 MG tablet Take 1 tablet (325 mg total) by mouth daily. 30 tablet 0  ? atenolol (TENORMIN) 50 MG tablet TAKE 1 TABLET EVERY DAY (Patient taking differently: Take 50 mg by mouth daily.) 90 tablet 2  ? cholecalciferol (VITAMIN D) 25 MCG (1000 UNIT) tablet TAKE 1 TABLET EVERY DAY (Patient taking differently: 1,000 Units daily.) 90 tablet 3  ? CYCLOPHOSPHAMIDE IV Inject into the vein every 21 ( twenty-one) days.    ? donepezil (ARICEPT) 5 MG tablet TAKE 1 TABLET AT BEDTIME (Patient taking differently: Take 5 mg by mouth at bedtime.) 90 tablet 1  ? DOXOrubicin HCl (ADRIAMYCIN IV) Inject into the vein every 21 ( twenty-one) days.    ? hydrochlorothiazide (MICROZIDE) 12.5 MG capsule TAKE 1 CAPSULE EVERY DAY (Patient taking differently: Take 12.5 mg by mouth daily.) 90 capsule 0  ? HYDROcodone-acetaminophen (NORCO/VICODIN) 5-325 MG tablet Take 1 tablet by mouth every 6 (six) hours as needed for moderate pain or severe pain. 20 tablet 0  ?  Lactulose 20 GM/30ML SOLN Take 30 mLs (20 g total) by mouth 3 (three) times daily as needed. 450 mL 0  ? levETIRAcetam (KEPPRA) 500 MG tablet Take 1 tablet (500 mg total) by mouth 2 (two) times daily. Needs to establish with new PCP.  Dr. Buelah Manis is no longer at this office.  Must be seen before more refills. 60 tablet 0  ? lidocaine-prilocaine (EMLA) cream Apply a small amount to port a cath site and cover with plastic wrap 1 hour prior to infusion appointments 30 g 3  ? memantine (NAMENDA) 5 MG tablet Take 5 mg by mouth daily.    ? predniSONE (DELTASONE) 20 MG tablet Take 5 tablets (100 mg) by mouth daily for 5 days starting on the first day of  each treatment.  DO NOT take on the weeks you are not receiving chemotherapy treatment. 30 tablet 0  ? prochlorperazine (COMPAZINE) 10 MG tablet Take 1 tablet (10 mg total) by mouth every 6 (six) hours as needed (Nausea or vomiting). 30 tablet 6  ? riTUXimab (RITUXAN IV) Inject into the vein every 21 ( twenty-one) days.    ? rosuvastatin (CRESTOR) 20 MG tablet Take 1 tablet (20 mg total) by mouth once a week. Thursday 30 tablet 1  ? traZODone (DESYREL) 50 MG tablet Take 0.5-1 tablets (25-50 mg total) by mouth at bedtime as needed for sleep. 30 tablet 0  ? VINCRISTINE SULFATE IV Inject into the vein every 21 ( twenty-one) days.    ? ?No current facility-administered medications for this visit.  ? ? ?ALLERGIES:  ?No Known Allergies ? ?PHYSICAL EXAM:  ?Performance status (ECOG): 2 - Symptomatic, <50% confined to bed ? ?There were no vitals filed for this visit. ?Wt Readings from Last 3 Encounters:  ?12/11/21 168 lb 4.8 oz (76.3 kg)  ?11/20/21 164 lb 9.6 oz (74.7 kg)  ?11/06/21 168 lb 6.9 oz (76.4 kg)  ? ?Physical Exam ?Vitals reviewed.  ?Constitutional:   ?   Appearance: Normal appearance.  ?Cardiovascular:  ?   Rate and Rhythm: Normal rate and regular rhythm.  ?   Pulses: Normal pulses.  ?   Heart sounds: Normal heart sounds.  ?Pulmonary:  ?   Effort: Pulmonary effort is  normal.  ?   Breath sounds: Examination of the right-lower field reveals decreased breath sounds. Decreased breath sounds present.  ?Musculoskeletal:  ?   Right lower leg: No edema.  ?   Left lower leg: No edema.  ?Neurological:  ?

## 2021-12-11 NOTE — Progress Notes (Signed)
Patient presents today for RCHOP infusion per providers order.  Vital signs within parameters for treatment.  Labs pending.  Patient has no new complaints at this time. ? ?Labs within parameters for treatment.  Message received from Anastasio Champion RN/Dr. Delton Coombes patient okay for treatment. ? ?RCHOP infusion given today per MD orders.  Stable during infusion without adverse affects.  Vital signs stable.  No complaints at this time.  Discharge from clinic ambulatory in stable condition.  Alert and oriented X 3.  [facility].  ?

## 2021-12-11 NOTE — Patient Instructions (Signed)
[place]  Discharge Instructions: ?Thank you for choosing [facility] to provide your oncology and hematology care.  ?If you have a lab appointment with the [facility], please come in thru the Main Entrance and check in at the main information desk. ? ?Wear comfortable clothing and clothing appropriate for easy access to any Portacath or PICC line.  ? ?We strive to give you quality time with your provider. You may need to reschedule your appointment if you arrive late (15 or more minutes).  Arriving late affects you and other patients whose appointments are after yours.  Also, if you miss three or more appointments without notifying the office, you may be dismissed from the clinic at the provider?s discretion.    ?  ?For prescription refill requests, have your pharmacy contact our office and allow 72 hours for refills to be completed.   ? ?Today you received the following chemotherapy and/or immunotherapy agents RCHOP    ?  ?To help prevent nausea and vomiting after your treatment, we encourage you to take your nausea medication as directed. ? ?BELOW ARE SYMPTOMS THAT SHOULD BE REPORTED IMMEDIATELY: ?*FEVER GREATER THAN 100.4 F (38 ?C) OR HIGHER ?*CHILLS OR SWEATING ?*NAUSEA AND VOMITING THAT IS NOT CONTROLLED WITH YOUR NAUSEA MEDICATION ?*UNUSUAL SHORTNESS OF BREATH ?*UNUSUAL BRUISING OR BLEEDING ?*URINARY PROBLEMS (pain or burning when urinating, or frequent urination) ?*BOWEL PROBLEMS (unusual diarrhea, constipation, pain near the anus) ?TENDERNESS IN MOUTH AND THROAT WITH OR WITHOUT PRESENCE OF ULCERS (sore throat, sores in mouth, or a toothache) ?UNUSUAL RASH, SWELLING OR PAIN  ?UNUSUAL VAGINAL DISCHARGE OR ITCHING  ? ?Items with * indicate a potential emergency and should be followed up as soon as possible or go to the Emergency Department if any problems should occur. ? ?Please show the CHEMOTHERAPY ALERT CARD or IMMUNOTHERAPY ALERT CARD at check-in to the Emergency Department  and triage nurse. ? ?Should you have questions after your visit or need to cancel or reschedule your appointment, please contact [facility] [phone]  and follow the prompts.  Office hours are 8:00 a.m. to 4:30 p.m. Monday - Friday. Please note that voicemails left after 4:00 p.m. may not be returned until the following business day.  We are closed weekends and major holidays. You have access to a nurse at all times for urgent questions. Please call the main number to the clinic [phone] and follow the prompts. ? ?For any non-urgent questions, you may also contact your provider using MyChart. We now offer e-Visits for anyone 6 and older to request care online for non-urgent symptoms. For details visit [email or web address]. ?  ?Also download the MyChart app! Go to the app store, search "MyChart", open the app, select [facility], and log in with your MyChart username and password. ? ?Due to Covid, a mask is required upon entering the hospital/clinic. If you do not have a mask, one will be given to you upon arrival. For doctor visits, patients may have 1 support person aged 18 or older with them. For treatment visits, patients cannot have anyone with them due to current Covid guidelines and our immunocompromised population.  ?

## 2021-12-11 NOTE — Progress Notes (Signed)
Patient has been examined by Dr. Katragadda, and vital signs and labs have been reviewed. ANC, Creatinine, LFTs, hemoglobin, and platelets are within treatment parameters per M.D. - pt may proceed with treatment.    °

## 2021-12-11 NOTE — Patient Instructions (Signed)
[place] at [facility] ?Discharge Instructions ? ? ?You were seen and examined today by Dr. Delton Coombes. ? ?He reviewed the results of your lab work which is normal/stable.  ? ?We will proceed with your treatment today.  ? ?We will repeat scans prior to your next treatment.  ? ?Return as scheduled.  ? ? ?Thank you for choosing [place] at [facility] to provide your oncology and hematology care.  To afford each patient quality time with our provider, please arrive at least 15 minutes before your scheduled appointment time.  ? ?If you have a lab appointment with the [facility] please come in thru the Main Entrance and check in at the main information desk. ? ?You need to re-schedule your appointment should you arrive 10 or more minutes late.  We strive to give you quality time with our providers, and arriving late affects you and other patients whose appointments are after yours.  Also, if you no show three or more times for appointments you may be dismissed from the clinic at the providers discretion.     ?Again, thank you for choosing [facility].  Our hope is that these requests will decrease the amount of time that you wait before being seen by our physicians.       ?_____________________________________________________________ ? ?Should you have questions after your visit to [facility] please contact our office at [phone] and follow the prompts.  Our office hours are 8:00 a.m. and 4:30 p.m. Monday - Friday.  Please note that voicemails left after 4:00 p.m. may not be returned until the following business day.  We are closed weekends and major holidays.  You do have access to a nurse 24-7, just call the main number to the clinic [phone] and do not press any options, hold on the line and a nurse will answer the phone.   ? ?For prescription refill requests, have your pharmacy contact our office and allow 72 hours.   ? ?Due  to Covid, you will need to wear a mask upon entering the hospital. If you do not have a mask, a mask will be given to you at the Main Entrance upon arrival. For doctor visits, patients may have 1 support person age 78 or older with them. For treatment visits, patients can not have anyone with them due to social distancing guidelines and our immunocompromised population.  ? ?   ?

## 2021-12-13 ENCOUNTER — Inpatient Hospital Stay (HOSPITAL_COMMUNITY): Payer: Medicare PPO

## 2021-12-13 ENCOUNTER — Encounter (HOSPITAL_COMMUNITY): Payer: Self-pay

## 2021-12-13 VITALS — BP 147/91 | HR 58 | Temp 97.8°F | Resp 18

## 2021-12-13 DIAGNOSIS — J90 Pleural effusion, not elsewhere classified: Secondary | ICD-10-CM | POA: Diagnosis not present

## 2021-12-13 DIAGNOSIS — Z5189 Encounter for other specified aftercare: Secondary | ICD-10-CM | POA: Diagnosis not present

## 2021-12-13 DIAGNOSIS — Z7982 Long term (current) use of aspirin: Secondary | ICD-10-CM | POA: Diagnosis not present

## 2021-12-13 DIAGNOSIS — Z95828 Presence of other vascular implants and grafts: Secondary | ICD-10-CM

## 2021-12-13 DIAGNOSIS — Z87891 Personal history of nicotine dependence: Secondary | ICD-10-CM | POA: Diagnosis not present

## 2021-12-13 DIAGNOSIS — Z5111 Encounter for antineoplastic chemotherapy: Secondary | ICD-10-CM | POA: Diagnosis not present

## 2021-12-13 DIAGNOSIS — Z79899 Other long term (current) drug therapy: Secondary | ICD-10-CM | POA: Diagnosis not present

## 2021-12-13 DIAGNOSIS — Z800 Family history of malignant neoplasm of digestive organs: Secondary | ICD-10-CM | POA: Diagnosis not present

## 2021-12-13 DIAGNOSIS — Z5112 Encounter for antineoplastic immunotherapy: Secondary | ICD-10-CM | POA: Diagnosis not present

## 2021-12-13 DIAGNOSIS — C8338 Diffuse large B-cell lymphoma, lymph nodes of multiple sites: Secondary | ICD-10-CM

## 2021-12-13 DIAGNOSIS — C8330 Diffuse large B-cell lymphoma, unspecified site: Secondary | ICD-10-CM | POA: Diagnosis not present

## 2021-12-13 MED ORDER — PEGFILGRASTIM-CBQV 6 MG/0.6ML ~~LOC~~ SOSY
6.0000 mg | PREFILLED_SYRINGE | Freq: Once | SUBCUTANEOUS | Status: AC
  Administered 2021-12-13: 6 mg via SUBCUTANEOUS
  Filled 2021-12-13: qty 0.6

## 2021-12-13 NOTE — Patient Instructions (Signed)
[facility]  Discharge Instructions: Thank you for choosing [facility] to provide your oncology and hematology care.  If you have a lab appointment with the Cancer Center, please come in thru the Main Entrance and check in at the main information desk.  Wear comfortable clothing and clothing appropriate for easy access to any Portacath or PICC line.   We strive to give you quality time with your provider. You may need to reschedule your appointment if you arrive late (15 or more minutes).  Arriving late affects you and other patients whose appointments are after yours.  Also, if you miss three or more appointments without notifying the office, you may be dismissed from the clinic at the provider's discretion.      For prescription refill requests, have your pharmacy contact our office and allow 72 hours for refills to be completed.    Today you received the following:  Udenyca injection      To help prevent nausea and vomiting after your treatment, we encourage you to take your nausea medication as directed.  BELOW ARE SYMPTOMS THAT SHOULD BE REPORTED IMMEDIATELY: *FEVER GREATER THAN 100.4 F (38 C) OR HIGHER *CHILLS OR SWEATING *NAUSEA AND VOMITING THAT IS NOT CONTROLLED WITH YOUR NAUSEA MEDICATION *UNUSUAL SHORTNESS OF BREATH *UNUSUAL BRUISING OR BLEEDING *URINARY PROBLEMS (pain or burning when urinating, or frequent urination) *BOWEL PROBLEMS (unusual diarrhea, constipation, pain near the anus) TENDERNESS IN MOUTH AND THROAT WITH OR WITHOUT PRESENCE OF ULCERS (sore throat, sores in mouth, or a toothache) UNUSUAL RASH, SWELLING OR PAIN  UNUSUAL VAGINAL DISCHARGE OR ITCHING   Items with * indicate a potential emergency and should be followed up as soon as possible or go to the Emergency Department if any problems should occur.  Please show the CHEMOTHERAPY ALERT CARD or IMMUNOTHERAPY ALERT CARD at check-in to the Emergency Department and triage  nurse.  Should you have questions after your visit or need to cancel or reschedule your appointment, please contact [facility] [phone]  and follow the prompts.  Office hours are 8:00 a.m. to 4:30 p.m. Monday - Friday. Please note that voicemails left after 4:00 p.m. may not be returned until the following business day.  We are closed weekends and major holidays. You have access to a nurse at all times for urgent questions. Please call the main number to the clinic [phone] and follow the prompts.  For any non-urgent questions, you may also contact your provider using MyChart. We now offer e-Visits for anyone 18 and older to request care online for non-urgent symptoms. For details visit [email or web address].   Also download the MyChart app! Go to the app store, search "MyChart", open the app, select [facility], and log in with your MyChart username and password.  Due to Covid, a mask is required upon entering the hospital/clinic. If you do not have a mask, one will be given to you upon arrival. For doctor visits, patients may have 1 support person aged 18 or older with them. For treatment visits, patients cannot have anyone with them due to current Covid guidelines and our immunocompromised population.  

## 2021-12-13 NOTE — Progress Notes (Signed)
Molly Patel presents today for injection per the provider's orders.  Udenyca 6 mg [place] administration without incident; injection site WNL; see MAR for injection details.  Patient tolerated procedure well and without incident.  No questions or complaints noted at this time. Discharged from clinic ambulatory in stable condition. Alert and oriented x 3. F/U with [facility] as scheduled.   ?

## 2021-12-26 ENCOUNTER — Ambulatory Visit (HOSPITAL_COMMUNITY)
Admission: RE | Admit: 2021-12-26 | Discharge: 2021-12-26 | Disposition: A | Discharge status: Home / Self Care | Payer: Medicare PPO | Source: Ambulatory Visit | Attending: Hematology | Admitting: Hematology

## 2021-12-26 DIAGNOSIS — C8510 Unspecified B-cell lymphoma, unspecified site: Secondary | ICD-10-CM | POA: Diagnosis not present

## 2021-12-26 DIAGNOSIS — C8338 Diffuse large B-cell lymphoma, lymph nodes of multiple sites: Secondary | ICD-10-CM | POA: Diagnosis not present

## 2021-12-26 MED ORDER — GADOBUTROL 1 MMOL/ML IV SOLN
7.5000 mL | Freq: Once | INTRAVENOUS | Status: AC | PRN
  Administered 2021-12-26: 7.5 mL via INTRAVENOUS

## 2022-01-02 ENCOUNTER — Ambulatory Visit (HOSPITAL_COMMUNITY)
Admission: RE | Admit: 2022-01-02 | Discharge: 2022-01-02 | Disposition: A | Discharge status: Home / Self Care | Payer: Medicare PPO | Source: Ambulatory Visit | Attending: Hematology | Admitting: Hematology

## 2022-01-02 DIAGNOSIS — C8338 Diffuse large B-cell lymphoma, lymph nodes of multiple sites: Secondary | ICD-10-CM | POA: Insufficient documentation

## 2022-01-02 MED ORDER — FLUDEOXYGLUCOSE F - 18 (FDG) INJECTION
9.6200 | Freq: Once | INTRAVENOUS | Status: AC | PRN
  Administered 2022-01-02: 9.62 via INTRAVENOUS

## 2022-01-06 ENCOUNTER — Other Ambulatory Visit (HOSPITAL_COMMUNITY): Payer: Self-pay | Admitting: Hematology

## 2022-01-07 ENCOUNTER — Inpatient Hospital Stay (HOSPITAL_COMMUNITY): Payer: Medicare PPO

## 2022-01-07 ENCOUNTER — Inpatient Hospital Stay (HOSPITAL_COMMUNITY): Payer: Medicare PPO | Attending: Hematology | Admitting: Hematology

## 2022-01-07 VITALS — BP 134/69 | HR 68 | Temp 97.5°F | Resp 18

## 2022-01-07 VITALS — BP 114/66 | HR 65 | Temp 97.4°F | Resp 20 | Ht 66.73 in | Wt 169.0 lb

## 2022-01-07 DIAGNOSIS — Z95828 Presence of other vascular implants and grafts: Secondary | ICD-10-CM

## 2022-01-07 DIAGNOSIS — C8338 Diffuse large B-cell lymphoma, lymph nodes of multiple sites: Secondary | ICD-10-CM | POA: Diagnosis not present

## 2022-01-07 DIAGNOSIS — C8330 Diffuse large B-cell lymphoma, unspecified site: Secondary | ICD-10-CM | POA: Diagnosis not present

## 2022-01-07 DIAGNOSIS — Z5112 Encounter for antineoplastic immunotherapy: Secondary | ICD-10-CM | POA: Diagnosis not present

## 2022-01-07 DIAGNOSIS — E876 Hypokalemia: Secondary | ICD-10-CM

## 2022-01-07 DIAGNOSIS — Z7982 Long term (current) use of aspirin: Secondary | ICD-10-CM | POA: Diagnosis not present

## 2022-01-07 DIAGNOSIS — Z79899 Other long term (current) drug therapy: Secondary | ICD-10-CM | POA: Insufficient documentation

## 2022-01-07 DIAGNOSIS — Z5111 Encounter for antineoplastic chemotherapy: Secondary | ICD-10-CM | POA: Diagnosis not present

## 2022-01-07 DIAGNOSIS — Z87891 Personal history of nicotine dependence: Secondary | ICD-10-CM | POA: Diagnosis not present

## 2022-01-07 DIAGNOSIS — C8510 Unspecified B-cell lymphoma, unspecified site: Secondary | ICD-10-CM

## 2022-01-07 LAB — CBC WITH DIFFERENTIAL/PLATELET
Abs Immature Granulocytes: 0.04 10*3/uL (ref 0.00–0.07)
Basophils Absolute: 0.1 10*3/uL (ref 0.0–0.1)
Basophils Relative: 3 %
Eosinophils Absolute: 0.1 10*3/uL (ref 0.0–0.5)
Eosinophils Relative: 4 %
HCT: 36.2 % (ref 36.0–46.0)
Hemoglobin: 11.7 g/dL — ABNORMAL LOW (ref 12.0–15.0)
Immature Granulocytes: 1 %
Lymphocytes Relative: 13 %
Lymphs Abs: 0.5 10*3/uL — ABNORMAL LOW (ref 0.7–4.0)
MCH: 31.2 pg (ref 26.0–34.0)
MCHC: 32.3 g/dL (ref 30.0–36.0)
MCV: 96.5 fL (ref 80.0–100.0)
Monocytes Absolute: 0.7 10*3/uL (ref 0.1–1.0)
Monocytes Relative: 20 %
Neutro Abs: 2.3 10*3/uL (ref 1.7–7.7)
Neutrophils Relative %: 59 %
Platelets: 155 10*3/uL (ref 150–400)
RBC: 3.75 MIL/uL — ABNORMAL LOW (ref 3.87–5.11)
RDW: 21.7 % — ABNORMAL HIGH (ref 11.5–15.5)
WBC: 3.7 10*3/uL — ABNORMAL LOW (ref 4.0–10.5)
nRBC: 0 % (ref 0.0–0.2)

## 2022-01-07 LAB — COMPREHENSIVE METABOLIC PANEL
ALT: 12 U/L (ref 0–44)
AST: 17 U/L (ref 15–41)
Albumin: 4.1 g/dL (ref 3.5–5.0)
Alkaline Phosphatase: 56 U/L (ref 38–126)
Anion gap: 5 (ref 5–15)
BUN: 17 mg/dL (ref 8–23)
CO2: 27 mmol/L (ref 22–32)
Calcium: 9 mg/dL (ref 8.9–10.3)
Chloride: 106 mmol/L (ref 98–111)
Creatinine, Ser: 0.78 mg/dL (ref 0.44–1.00)
GFR, Estimated: >60 mL/min (ref >60)
Glucose, Bld: 130 mg/dL — ABNORMAL HIGH (ref 70–99)
Potassium: 3.3 mmol/L — ABNORMAL LOW (ref 3.5–5.1)
Sodium: 138 mmol/L (ref 135–145)
Total Bilirubin: 0.5 mg/dL (ref 0.3–1.2)
Total Protein: 6.4 g/dL — ABNORMAL LOW (ref 6.5–8.1)

## 2022-01-07 LAB — LACTATE DEHYDROGENASE: LDH: 179 U/L (ref 98–192)

## 2022-01-07 LAB — URIC ACID: Uric Acid, Serum: 2.7 mg/dL (ref 2.5–7.1)

## 2022-01-07 LAB — MAGNESIUM: Magnesium: 2.1 mg/dL (ref 1.7–2.4)

## 2022-01-07 LAB — PHOSPHORUS: Phosphorus: 3.3 mg/dL (ref 2.5–4.6)

## 2022-01-07 MED ORDER — SODIUM CHLORIDE 0.9 % IV SOLN: Freq: Once | INTRAVENOUS | Status: AC

## 2022-01-07 MED ORDER — POTASSIUM CHLORIDE CRYS ER 20 MEQ PO TBCR
40.0000 meq | EXTENDED_RELEASE_TABLET | Freq: Once | ORAL | Status: AC
  Administered 2022-01-07: 40 meq via ORAL
  Filled 2022-01-07: qty 2

## 2022-01-07 MED ORDER — DIPHENHYDRAMINE HCL 25 MG PO CAPS
50.0000 mg | ORAL_CAPSULE | Freq: Once | ORAL | Status: AC
  Administered 2022-01-07: 50 mg via ORAL
  Filled 2022-01-07: qty 2

## 2022-01-07 MED ORDER — VINCRISTINE SULFATE CHEMO INJECTION 1 MG/ML
1.0000 mg | Freq: Once | INTRAVENOUS | Status: AC
  Administered 2022-01-07: 1 mg via INTRAVENOUS
  Filled 2022-01-07: qty 1

## 2022-01-07 MED ORDER — DOXORUBICIN HCL CHEMO IV INJECTION 2 MG/ML
25.0000 mg/m2 | Freq: Once | INTRAVENOUS | Status: AC
  Administered 2022-01-07: 48 mg via INTRAVENOUS
  Filled 2022-01-07: qty 24

## 2022-01-07 MED ORDER — HEPARIN SOD (PORK) LOCK FLUSH 100 UNIT/ML IV SOLN
500.0000 [IU] | Freq: Once | INTRAVENOUS | Status: AC | PRN
  Administered 2022-01-07: 500 [IU]

## 2022-01-07 MED ORDER — SODIUM CHLORIDE 0.9 % IV SOLN
150.0000 mg | Freq: Once | INTRAVENOUS | Status: AC
  Administered 2022-01-07: 150 mg via INTRAVENOUS
  Filled 2022-01-07: qty 150

## 2022-01-07 MED ORDER — ACETAMINOPHEN 325 MG PO TABS
650.0000 mg | ORAL_TABLET | Freq: Once | ORAL | Status: AC
  Administered 2022-01-07: 650 mg via ORAL
  Filled 2022-01-07: qty 2

## 2022-01-07 MED ORDER — FAMOTIDINE IN NACL 20-0.9 MG/50ML-% IV SOLN
20.0000 mg | Freq: Once | INTRAVENOUS | Status: AC
  Administered 2022-01-07: 20 mg via INTRAVENOUS
  Filled 2022-01-07: qty 50

## 2022-01-07 MED ORDER — SODIUM CHLORIDE 0.9 % IV SOLN
10.0000 mg | Freq: Once | INTRAVENOUS | Status: AC
  Administered 2022-01-07: 10 mg via INTRAVENOUS
  Filled 2022-01-07: qty 10

## 2022-01-07 MED ORDER — SODIUM CHLORIDE 0.9 % IV SOLN
375.0000 mg/m2 | Freq: Once | INTRAVENOUS | Status: AC
  Administered 2022-01-07: 700 mg via INTRAVENOUS
  Filled 2022-01-07: qty 50

## 2022-01-07 MED ORDER — SODIUM CHLORIDE 0.9 % IV SOLN
600.0000 mg/m2 | Freq: Once | INTRAVENOUS | Status: AC
  Administered 2022-01-07: 1160 mg via INTRAVENOUS
  Filled 2022-01-07: qty 58

## 2022-01-07 MED ORDER — SODIUM CHLORIDE 0.9% FLUSH
10.0000 mL | INTRAVENOUS | Status: DC | PRN
  Administered 2022-01-07 (×2): 10 mL

## 2022-01-07 MED ORDER — PALONOSETRON HCL INJECTION 0.25 MG/5ML
0.2500 mg | Freq: Once | INTRAVENOUS | Status: AC
  Administered 2022-01-07: 0.25 mg via INTRAVENOUS
  Filled 2022-01-07: qty 5

## 2022-01-07 NOTE — Progress Notes (Signed)
Patient tolerated chemotherapy with no complaints voiced.  Side effects with management reviewed with understanding verbalized.  Port site clean and dry with no bruising or swelling noted at site.  Good blood return noted before and after administration of chemotherapy.  Band aid applied.  Patient left in satisfactory condition with VSS and no s/s of distress noted.   

## 2022-01-07 NOTE — Progress Notes (Signed)
? ?[facility] ?[address] ?[place], [place] [address] ? ? ?CLINIC:  ?Medical Oncology/Hematology ? ?PCP:  ?Molly Rival, FNP ?[address] / [place] [address] ?[phone] ? ? ?REASON FOR VISIT:  ?Follow-up for large B-cell lymphoma ? ?PRIOR THERAPY: none ? ?NGS Results: not done ? ?CURRENT THERAPY: R-CHOP q21d ? ?BRIEF ONCOLOGIC HISTORY:  ?Oncology History  ?Diffuse large B-cell lymphoma ([place])  ?10/18/2021 Initial Diagnosis  ? Diffuse large B-cell lymphoma ([place]) ? ?  ?10/30/2021 -  Chemotherapy  ? Patient is on Treatment Plan : NON-HODGKINS LYMPHOMA R-CHOP q21d  ? ?   ? ? ?CANCER STAGING: ? Cancer Staging  ?Diffuse large B-cell lymphoma ([place]) ?Staging form: Hodgkin and Non-Hodgkin Lymphoma, AJCC 8th Edition ?- Clinical stage from 10/22/2021: Stage IV (Diffuse large B-cell lymphoma) - Unsigned ? ? ?INTERVAL HISTORY:  ?Molly Patel, a 76 y.o. female, returns for routine follow-up and consideration for next cycle of chemotherapy. Molly Patel was last seen on 12/11/2021. ? ?Due for cycle #4 of R-CHOP today.  ? ?Overall, she tells me she has been feeling pretty well. She reports severe fatigue as well as episodes of dizziness and lightheadedness which have caused falls following standing upright for 5 days following her last treatment. She does not take potassium. She reports mild ankle swellings.   ? ?Overall, she feels ready for next cycle of chemo today.  ? ?REVIEW OF SYSTEMS:  ?Review of Systems  ?Constitutional:  Positive for fatigue. Negative for appetite change.  ?HENT:   Positive for trouble swallowing.   ?Cardiovascular:  Positive for leg swelling (mild).  ?Genitourinary:  Positive for frequency.   ?Neurological:  Positive for dizziness and light-headedness.  ?Psychiatric/Behavioral:  Positive for depression and sleep disturbance. The patient is nervous/anxious.   ?All other systems reviewed and are negative. ? ?PAST MEDICAL/SURGICAL HISTORY:  ?Past Medical History:   ?Diagnosis Date  ? Angioedema   ? B-cell lymphoma ([place])   ? Depression   ? Dizziness   ? Echocardiogram with ECG monitoring 02/23/2011  ? mild LVH, normal event monitor   ? Hyperlipidemia   ? Hypertension   ? takes meds daily  ? Incontinence   ? Insomnia   ? Ovarian cyst   ? Port-A-Cath in place 10/29/2021  ? Seizures ([place])   ? 2 weeks ago  ? Stroke ([place]) 08/25/2014  ? Syncope   ? Vitamin D deficiency   ? ?Past Surgical History:  ?Procedure Laterality Date  ? ABDOMINAL HYSTERECTOMY    ? COLONOSCOPY N/A 11/02/2014  ? Procedure: COLONOSCOPY;  Surgeon: Rogene Houston, MD;  [facility];  Service: Endoscopy;  Laterality: N/A;  140 - moved to 12:55 - Ann to notify pt  ? CRANIOTOMY  06/29/2012  ? Procedure: CRANIOTOMY TUMOR EXCISION;  Surgeon: Faythe Ghee, MD;  Location: [facility];  Service: Neurosurgery;  Laterality: Left;  Left frontal Craniotomy for Meningioma  ? ESOPHAGEAL DILATION N/A 11/02/2014  ? Procedure: ESOPHAGEAL DILATION;  Surgeon: Rogene Houston, MD;  [facility]: [facility];  Service: Endoscopy;  Laterality: N/A;  ? ESOPHAGOGASTRODUODENOSCOPY N/A 11/02/2014  ? Procedure: ESOPHAGOGASTRODUODENOSCOPY (EGD);  Surgeon: Rogene Houston, MD;  [facility]: [facility];  Service: Endoscopy;  Laterality: N/A;  ? PORTACATH PLACEMENT Left 10/18/2021  ? Procedure: INSERTION PORT-A-CATH;  Surgeon: Aviva Signs, MD;  Location: [facility];  Service: General;  Laterality: Left;  ? VESICOVAGINAL FISTULA CLOSURE W/ TAH    ? ? ?SOCIAL HISTORY:  ?  Social History  ? ?Socioeconomic History  ? Marital status: Divorced  ?  Spouse name: Not on file  ? Number of children: 2  ? Years of education: Not on file  ? Highest education level: Not on file  ?Occupational History  ? Occupation: Retired  ?  Comment: [employer]  ?Tobacco Use  ? Smoking status: Former  ?  Years: 7.00  ?  Types: Cigarettes  ?  Quit date: 08/25/1985  ?  Years since quitting: 36.3  ? Smokeless tobacco: Never  ?Vaping Use  ? Vaping Use: Never used   ?Substance and Sexual Activity  ? Alcohol use: No  ? Drug use: No  ? Sexual activity: Not on file  ?Other Topics Concern  ? Not on file  ?Social History Narrative  ? Not on file  ? ?Social Determinants of Health  ? ?Financial Resource Strain: Not on file  ?Food Insecurity: Not on file  ?Transportation Needs: Not on file  ?Physical Activity: Not on file  ?Stress: Not on file  ?Social Connections: Not on file  ?Intimate Partner Violence: Not on file  ? ? ?FAMILY HISTORY:  ?Family History  ?Problem Relation Age of Onset  ? Stroke Father   ? Asthma Grandchild   ? Arthritis Sister   ? Diabetes Sister   ? Colon cancer Brother   ? ? ?CURRENT MEDICATIONS:  ?Current Outpatient Medications  ?Medication Sig Dispense Refill  ? allopurinol (ZYLOPRIM) 300 MG tablet TAKE 1 TABLET BY MOUTH EVERY DAY 30 tablet 2  ? aspirin EC 325 MG tablet Take 1 tablet (325 mg total) by mouth daily. 30 tablet 0  ? atenolol (TENORMIN) 50 MG tablet TAKE 1 TABLET EVERY DAY (Patient taking differently: Take 50 mg by mouth daily.) 90 tablet 2  ? cholecalciferol (VITAMIN D) 25 MCG (1000 UNIT) tablet TAKE 1 TABLET EVERY DAY (Patient taking differently: 1,000 Units daily.) 90 tablet 3  ? CYCLOPHOSPHAMIDE IV Inject into the vein every 21 ( twenty-one) days.    ? donepezil (ARICEPT) 5 MG tablet TAKE 1 TABLET AT BEDTIME (Patient taking differently: Take 5 mg by mouth at bedtime.) 90 tablet 1  ? DOXOrubicin HCl (ADRIAMYCIN IV) Inject into the vein every 21 ( twenty-one) days.    ? hydrochlorothiazide (MICROZIDE) 12.5 MG capsule TAKE 1 CAPSULE EVERY DAY (Patient taking differently: Take 12.5 mg by mouth daily.) 90 capsule 0  ? HYDROcodone-acetaminophen (NORCO/VICODIN) 5-325 MG tablet Take 1 tablet by mouth every 6 (six) hours as needed for moderate pain or severe pain. 20 tablet 0  ? Lactulose 20 GM/30ML SOLN Take 30 mLs (20 g total) by mouth 3 (three) times daily as needed. 450 mL 0  ? levETIRAcetam (KEPPRA) 500 MG tablet Take 1 tablet (500 mg total) by mouth  2 (two) times daily. Needs to establish with new PCP.  Dr. Buelah Manis is no longer at this office.  Must be seen before more refills. 60 tablet 0  ? lidocaine-prilocaine (EMLA) cream Apply a small amount to port a cath site and cover with plastic wrap 1 hour prior to infusion appointments 30 g 3  ? memantine (NAMENDA) 5 MG tablet Take 5 mg by mouth daily.    ? predniSONE (DELTASONE) 20 MG tablet Take 5 tablets (100 mg) by mouth daily for 5 days starting on the first day of each treatment.  DO NOT take on the weeks you are not receiving chemotherapy treatment. 30 tablet 0  ? prochlorperazine (COMPAZINE) 10 MG tablet Take 1 tablet (  10 mg total) by mouth every 6 (six) hours as needed (Nausea or vomiting). 30 tablet 6  ? riTUXimab (RITUXAN IV) Inject into the vein every 21 ( twenty-one) days.    ? rosuvastatin (CRESTOR) 20 MG tablet Take 1 tablet (20 mg total) by mouth once a week. Thursday 30 tablet 1  ? traZODone (DESYREL) 50 MG tablet Take 0.5-1 tablets (25-50 mg total) by mouth at bedtime as needed for sleep. 30 tablet 0  ? VINCRISTINE SULFATE IV Inject into the vein every 21 ( twenty-one) days.    ? ?No current facility-administered medications for this visit.  ? ? ?ALLERGIES:  ?No Known Allergies ? ?PHYSICAL EXAM:  ?Performance status (ECOG): 2 - Symptomatic, <50% confined to bed ? ?There were no vitals filed for this visit. ?Wt Readings from Last 3 Encounters:  ?12/11/21 168 lb 4.8 oz (76.3 kg)  ?11/20/21 164 lb 9.6 oz (74.7 kg)  ?11/06/21 168 lb 6.9 oz (76.4 kg)  ? ?Physical Exam ?Vitals reviewed.  ?Constitutional:   ?   Appearance: Normal appearance.  ?Cardiovascular:  ?   Rate and Rhythm: Normal rate and regular rhythm.  ?   Pulses: Normal pulses.  ?   Heart sounds: Normal heart sounds.  ?Pulmonary:  ?   Effort: Pulmonary effort is normal.  ?   Breath sounds: Examination of the right-lower field reveals decreased breath sounds. Decreased breath sounds present.  ?Musculoskeletal:  ?   Right lower leg: No edema.  ?    Left lower leg: No edema.  ?Neurological:  ?   General: No focal deficit present.  ?   Mental Status: She is alert and oriented to person, place, and time.  ?Psychiatric:     ?   Mood and Affect: Mood n

## 2022-01-07 NOTE — Patient Instructions (Signed)
[facility]  Discharge Instructions: ?Thank you for choosing [facility] to provide your oncology and hematology care.  ?If you have a lab appointment with the [facility], please come in thru the Main Entrance and check in at the main information desk. ? ?Wear comfortable clothing and clothing appropriate for easy access to any Portacath or PICC line.  ? ?We strive to give you quality time with your provider. You may need to reschedule your appointment if you arrive late (15 or more minutes).  Arriving late affects you and other patients whose appointments are after yours.  Also, if you miss three or more appointments without notifying the office, you may be dismissed from the clinic at the provider?s discretion.    ?  ?For prescription refill requests, have your pharmacy contact our office and allow 72 hours for refills to be completed.   ? ?Today you received the following chemotherapy and/or immunotherapy agents rituxan, adriamycin, vincrisitn, cytosan. ?Rituximab Injection ?What is this medication? ?RITUXIMAB (ri TUX i mab) is a monoclonal antibody. It is used to treat certain types of cancer like non-Hodgkin lymphoma and chronic lymphocytic leukemia. It is also used to treat rheumatoid arthritis, granulomatosis with polyangiitis, microscopic polyangiitis, and pemphigus vulgaris. ?This medicine may be used for other purposes; ask your health care provider or pharmacist if you have questions. ?COMMON BRAND NAME(S): RIABNI, Rituxan, RUXIENCE, truxima ?What should I tell my care team before I take this medication? ?They need to know if you have any of these conditions: ?chest pain ?heart disease ?infection especially a viral infection such as chickenpox, cold sores, hepatitis B, or herpes ?immune system problems ?irregular heartbeat or rhythm ?kidney disease ?low blood counts (white cells, platelets, or red cells) ?lung disease ?recent or upcoming vaccine ?an unusual or allergic  reaction to rituximab, other medicines, foods, dyes, or preservatives ?pregnant or trying to get pregnant ?breast-feeding ?How should I use this medication? ?This medicine is injected into a vein. It is given by a health care provider in a hospital or clinic setting. ?A special MedGuide will be given to you before each treatment. Be sure to read this information carefully each time. ?Talk to your health care provider about the use of this medicine in children. While this drug may be prescribed for children as young as 6 months for selected conditions, precautions do apply. ?Overdosage: If you think you have taken too much of this medicine contact a poison control center or emergency room at once. ?NOTE: This medicine is only for you. Do not share this medicine with others. ?What if I miss a dose? ?Keep appointments for follow-up doses. It is important not to miss your dose. Call your health care provider if you are unable to keep an appointment. ?What may interact with this medication? ?Do not take this medicine with any of the following medicines: ?live vaccines ?This medicine may also interact with the following medicines: ?cisplatin ?This list may not describe all possible interactions. Give your health care provider a list of all the medicines, herbs, non-prescription drugs, or dietary supplements you use. Also tell them if you smoke, drink alcohol, or use illegal drugs. Some items may interact with your medicine. ?What should I watch for while using this medication? ?Your condition will be monitored carefully while you are receiving this medicine. You may need blood work done while you are taking this medicine. ?This medicine can cause serious infusion reactions. To reduce the risk your health care provider may give  you other medicines to take before receiving this one. Be sure to follow the directions from your health care provider. ?This medicine may increase your risk of getting an infection. Call your  health care provider for advice if you get a fever, chills, sore throat, or other symptoms of a cold or flu. Do not treat yourself. Try to avoid being around people who are sick. ?Call your health care provider if you are around anyone with measles, chickenpox, or if you develop sores or blisters that do not heal properly. ?Avoid taking medicines that contain aspirin, acetaminophen, ibuprofen, naproxen, or ketoprofen unless instructed by your health care provider. These medicines may hide a fever. ?This medicine may cause serious skin reactions. They can happen weeks to months after starting the medicine. Contact your health care provider right away if you notice fevers or flu-like symptoms with a rash. The rash may be red or purple and then turn into blisters or peeling of the skin. Or, you might notice a red rash with swelling of the face, lips or lymph nodes in your neck or under your arms. ?In some patients, this medicine may cause a serious brain infection that may cause death. If you have any problems seeing, thinking, speaking, walking, or standing, tell your healthcare professional right away. If you cannot reach your healthcare professional, urgently seek other source of medical care. ?Do not become pregnant while taking this medicine or for at least 12 months after stopping it. Women should inform their health care provider if they wish to become pregnant or think they might be pregnant. There is potential for serious harm to an unborn child. Talk to your health care provider for more information. Women should use a reliable form of birth control while taking this medicine and for 12 months after stopping it. Do not breast-feed while taking this medicine or for at least 6 months after stopping it. ?What side effects may I notice from receiving this medication? ?Side effects that you should report to your health care provider as soon as possible: ?allergic reactions (skin rash, itching or hives; swelling of  the face, lips, or tongue) ?diarrhea ?edema (sudden weight gain; swelling of the ankles, feet, hands or other unusual swelling; trouble breathing) ?fast, irregular heartbeat ?heart attack (trouble breathing; pain or tightness in the chest, neck, back or arms; unusually weak or tired) ?infection (fever, chills, cough, sore throat, pain or trouble passing urine) ?kidney injury (trouble passing urine or change in the amount of urine) ?liver injury (dark yellow or brown urine; general ill feeling or flu-like symptoms; loss of appetite, right upper belly pain; unusually weak or tired, yellowing of the eyes or skin) ?low blood pressure (dizziness; feeling faint or lightheaded, falls; unusually weak or tired) ?low red blood cell counts (trouble breathing; feeling faint; lightheaded, falls; unusually weak or tired) ?mouth sores ?redness, blistering, peeling, or loosening of the skin, including inside the mouth ?stomach pain ?unusual bruising or bleeding ?wheezing (trouble breathing with loud or whistling sounds) ?vomiting ?Side effects that usually do not require medical attention (report to your health care provider if they continue or are bothersome): ?headache ?joint pain ?muscle cramps, pain ?nausea ?This list may not describe all possible side effects. Call your doctor for medical advice about side effects. You may report side effects to FDA at 1-800-FDA-1088. ?Where should I keep my medication? ?This medicine is given in a hospital or clinic. It will not be stored at home. ?NOTE: This sheet is a summary. It may  not cover all possible information. If you have questions about this medicine, talk to your doctor, pharmacist, or health care provider. ?? 2023 Elsevier/Gold Standard (2020-08-13 00:00:00) ?    ?  ?To help prevent nausea and vomiting after your treatment, we encourage you to take your nausea medication as directed. ? ?BELOW ARE SYMPTOMS THAT SHOULD BE REPORTED IMMEDIATELY: ?*FEVER GREATER THAN 100.4 F (38 ?C)  OR HIGHER ?*CHILLS OR SWEATING ?*NAUSEA AND VOMITING THAT IS NOT CONTROLLED WITH YOUR NAUSEA MEDICATION ?*UNUSUAL SHORTNESS OF BREATH ?*UNUSUAL BRUISING OR BLEEDING ?*URINARY PROBLEMS (pain or burning when urin

## 2022-01-07 NOTE — Patient Instructions (Signed)
[facility] ?Discharge Instructions ? ? ?You were seen and examined today by Dr. Delton Coombes. ? ?He reviewed your lab work which was normal/stable. Your potassium was slightly low. We will give you potassium pills in the clinic today. He also reviewed the results of your PET scan which is excellent. The lymphoma has responded well to treatment.  ? ?STOP taking hydrochlorothiazide and take your time getting up for the several days after receiving your shot.  ? ?We will proceed with treatment today. ? ?Return as scheduled in 3 weeks.  ? ? ?Thank you for choosing [facility] at [facility] to provide your oncology and hematology care.  To afford each patient quality time with our provider, please arrive at least 15 minutes before your scheduled appointment time.  ? ?If you have a lab appointment with the [facility] please come in thru the Main Entrance and check in at the main information desk. ? ?You need to re-schedule your appointment should you arrive 10 or more minutes late.  We strive to give you quality time with our providers, and arriving late affects you and other patients whose appointments are after yours.  Also, if you no show three or more times for appointments you may be dismissed from the clinic at the providers discretion.     ?Again, thank you for choosing [facility].  Our hope is that these requests will decrease the amount of time that you wait before being seen by our physicians.       ?_____________________________________________________________ ? ?Should you have questions after your visit to [facility], please contact our office at [phone] and follow the prompts.  Our office hours are 8:00 a.m. and 4:30 p.m. Monday - Friday.  Please note that voicemails left after 4:00 p.m. may not be returned until the following business day.  We are closed weekends and major holidays.  You do have access to a  nurse 24-7, just call the main number to the clinic [phone] and do not press any options, hold on the line and a nurse will answer the phone.   ? ?For prescription refill requests, have your pharmacy contact our office and allow 72 hours.   ? ?Due to Covid, you will need to wear a mask upon entering the hospital. If you do not have a mask, a mask will be given to you at the Main Entrance upon arrival. For doctor visits, patients may have 1 support person age 10 or older with them. For treatment visits, patients can not have anyone with them due to social distancing guidelines and our immunocompromised population.  ? ?   ?

## 2022-01-07 NOTE — Progress Notes (Signed)
Patient has been examined by Dr. Katragadda, and vital signs and labs have been reviewed. ANC, Creatinine, LFTs, hemoglobin, and platelets are within treatment parameters per M.D. - pt may proceed with treatment.    °

## 2022-01-09 ENCOUNTER — Inpatient Hospital Stay (HOSPITAL_COMMUNITY): Payer: Medicare PPO

## 2022-01-09 VITALS — BP 164/81 | HR 65 | Temp 96.2°F | Resp 20

## 2022-01-09 DIAGNOSIS — Z95828 Presence of other vascular implants and grafts: Secondary | ICD-10-CM

## 2022-01-09 DIAGNOSIS — Z5111 Encounter for antineoplastic chemotherapy: Secondary | ICD-10-CM | POA: Diagnosis not present

## 2022-01-09 DIAGNOSIS — Z87891 Personal history of nicotine dependence: Secondary | ICD-10-CM | POA: Diagnosis not present

## 2022-01-09 DIAGNOSIS — Z7982 Long term (current) use of aspirin: Secondary | ICD-10-CM | POA: Diagnosis not present

## 2022-01-09 DIAGNOSIS — Z5112 Encounter for antineoplastic immunotherapy: Secondary | ICD-10-CM | POA: Diagnosis not present

## 2022-01-09 DIAGNOSIS — Z79899 Other long term (current) drug therapy: Secondary | ICD-10-CM | POA: Diagnosis not present

## 2022-01-09 DIAGNOSIS — C8330 Diffuse large B-cell lymphoma, unspecified site: Secondary | ICD-10-CM | POA: Diagnosis not present

## 2022-01-09 DIAGNOSIS — C8338 Diffuse large B-cell lymphoma, lymph nodes of multiple sites: Secondary | ICD-10-CM

## 2022-01-09 MED ORDER — PEGFILGRASTIM-CBQV 6 MG/0.6ML ~~LOC~~ SOSY
6.0000 mg | PREFILLED_SYRINGE | Freq: Once | SUBCUTANEOUS | Status: AC
  Administered 2022-01-09: 6 mg via SUBCUTANEOUS
  Filled 2022-01-09: qty 0.6

## 2022-01-09 NOTE — Patient Instructions (Signed)
[facility]  Discharge Instructions: °Thank you for choosing [facility] to provide your oncology and hematology care.  °If you have a lab appointment with the Cancer Center, please come in thru the Main Entrance and check in at the main information desk. ° °Wear comfortable clothing and clothing appropriate for easy access to any Portacath or PICC line.  ° °We strive to give you quality time with your provider. You may need to reschedule your appointment if you arrive late (15 or more minutes).  Arriving late affects you and other patients whose appointments are after yours.  Also, if you miss three or more appointments without notifying the office, you may be dismissed from the clinic at the provider’s discretion.    °  °For prescription refill requests, have your pharmacy contact our office and allow 72 hours for refills to be completed.   ° °Today you received Udenyca ° ° °BELOW ARE SYMPTOMS THAT SHOULD BE REPORTED IMMEDIATELY: °*FEVER GREATER THAN 100.4 F (38 °C) OR HIGHER °*CHILLS OR SWEATING °*NAUSEA AND VOMITING THAT IS NOT CONTROLLED WITH YOUR NAUSEA MEDICATION °*UNUSUAL SHORTNESS OF BREATH °*UNUSUAL BRUISING OR BLEEDING °*URINARY PROBLEMS (pain or burning when urinating, or frequent urination) °*BOWEL PROBLEMS (unusual diarrhea, constipation, pain near the anus) °TENDERNESS IN MOUTH AND THROAT WITH OR WITHOUT PRESENCE OF ULCERS (sore throat, sores in mouth, or a toothache) °UNUSUAL RASH, SWELLING OR PAIN  °UNUSUAL VAGINAL DISCHARGE OR ITCHING  ° °Items with * indicate a potential emergency and should be followed up as soon as possible or go to the Emergency Department if any problems should occur. ° °Please show the CHEMOTHERAPY ALERT CARD or IMMUNOTHERAPY ALERT CARD at check-in to the Emergency Department and triage nurse. ° °Should you have questions after your visit or need to cancel or reschedule your appointment, please contact [facility] [phone]  and  follow the prompts.  Office hours are 8:00 a.m. to 4:30 p.m. Monday - Friday. Please note that voicemails left after 4:00 p.m. may not be returned until the following business day.  We are closed weekends and major holidays. You have access to a nurse at all times for urgent questions. Please call the main number to the clinic [phone] and follow the prompts. ° °For any non-urgent questions, you may also contact your provider using MyChart. We now offer e-Visits for anyone 18 and older to request care online for non-urgent symptoms. For details visit [email or web address]. °  °Also download the MyChart app! Go to the app store, search "MyChart", open the app, select [facility], and log in with your MyChart username and password. ° °Due to Covid, a mask is required upon entering the hospital/clinic. If you do not have a mask, one will be given to you upon arrival. For doctor visits, patients may have 1 support person aged 18 or older with them. For treatment visits, patients cannot have anyone with them due to current Covid guidelines and our immunocompromised population.  °

## 2022-01-09 NOTE — Progress Notes (Signed)
Molly Patel presents today for injection per the provider's orders.  Udenyca administration without incident; injection site WNL; see MAR for injection details.  Patient tolerated procedure well and without incident.  No questions or complaints noted at this time.   Discharged from clinic ambulatory in stable condition. Alert and oriented x 3. F/U with [facility] as scheduled.

## 2022-01-28 ENCOUNTER — Ambulatory Visit (INDEPENDENT_AMBULATORY_CARE_PROVIDER_SITE_OTHER): Payer: Medicare PPO | Admitting: Nurse Practitioner

## 2022-01-28 ENCOUNTER — Encounter: Payer: Self-pay | Admitting: Nurse Practitioner

## 2022-01-28 VITALS — BP 132/70 | HR 75 | Ht 66.0 in | Wt 167.0 lb

## 2022-01-28 DIAGNOSIS — C8330 Diffuse large B-cell lymphoma, unspecified site: Secondary | ICD-10-CM | POA: Diagnosis not present

## 2022-01-28 DIAGNOSIS — E663 Overweight: Secondary | ICD-10-CM

## 2022-01-28 DIAGNOSIS — G4700 Insomnia, unspecified: Secondary | ICD-10-CM

## 2022-01-28 DIAGNOSIS — I10 Essential (primary) hypertension: Secondary | ICD-10-CM

## 2022-01-28 DIAGNOSIS — R4189 Other symptoms and signs involving cognitive functions and awareness: Secondary | ICD-10-CM

## 2022-01-28 DIAGNOSIS — E7800 Pure hypercholesterolemia, unspecified: Secondary | ICD-10-CM | POA: Diagnosis not present

## 2022-01-28 MED ORDER — DONEPEZIL HCL 5 MG PO TABS: 5.0000 mg | ORAL_TABLET | Freq: Every day | ORAL | 1 refills | Status: DC

## 2022-01-28 MED ORDER — ROSUVASTATIN CALCIUM 20 MG PO TABS: 20.0000 mg | ORAL_TABLET | ORAL | 1 refills | Status: DC

## 2022-01-28 MED ORDER — PREDNISONE 20 MG PO TABS: ORAL_TABLET | ORAL | 0 refills | Status: DC

## 2022-01-28 NOTE — Assessment & Plan Note (Signed)
Chronic condition well-controlled Continue trazodone 25 to 50 mg as needed at bedtime

## 2022-01-28 NOTE — Progress Notes (Signed)
   Molly Patel     MRN: [id number]      DOB: 04-15-1946   HPI Molly Patel with past medical history of insomnia, hypertension, hyperlipidemia, cognitive changes, diffuse large B cell lymphoma is here for follow up and re-evaluation of chronic medical conditions  Patient stated that she is doing much better since she started taking chemotherapy for her lymphoma, patient currently denies pain, she has been following up with hematology as planned.  Stated that she has not needed trazodone in a while for insomnia.       ROS Denies recent fever or chills. Denies sinus pressure, nasal congestion, ear pain or sore throat. Denies chest congestion, productive cough or wheezing. Denies chest pains, palpitations and leg swelling Denies abdominal pain, nausea, vomiting,diarrhea or constipation.   Denies dysuria, frequency, hesitancy or incontinence. Denies joint pain, swelling and limitation in mobility. Denies headaches, seizures, numbness, or tingling. Denies depression, anxiety or insomnia.    PE  BP 132/70 (BP Location: Left Arm, Cuff Size: Normal)   Pulse 75   Ht '5\' 6"'$  (1.676 m)   Wt 167 lb (75.8 kg)   SpO2 94%   BMI 26.95 kg/m   Patient alert and oriented and in no cardiopulmonary distress. Chest: Clear to auscultation bilaterally.  CVS: S1, S2 no murmurs, no S3.Regular rate.  ABD: Soft non tender.   Ext: No edema  MS: Adequate ROM spine, shoulders, hips and knees.  Psych: Good eye contact, normal affect. Memory intact not anxious or depressed appearing.  CNS: CN 2-12 intact, power,  normal throughout.no focal deficits noted.   Assessment & Plan  Hypertension BP Readings from Last 3 Encounters:  01/28/22 132/70  01/09/22 (!) 164/81  01/07/22 134/69  Chronic condition well-controlled on atenolol 50 mg daily, currently not taking hydrochlorothiazide states that hematologist told her to hold off on medication. Continue atenolol 50 mg daily DASH diet advised  patient encouraged to exercise at least 150 minutes weekly as tolerated Follow-up in 5 months  Insomnia Chronic condition well-controlled Continue trazodone 25 to 50 mg as needed at bedtime  Hyperlipidemia Chronic condition on rosuvastatin 20 mg daily Fasting lipid ordered Avoid fatty fried foods  Cognitive changes Patient alert and oriented x4 Currently taking Namenda 5 mg daily, Aricept 5 mg daily Continue current medications Aricept refilled  Diffuse large B-cell lymphoma ([place]) Chronic condition Reports feeling much better since she started chemotherapy Patient encouraged to maintain close follow-up with hematologist Appreciate collaboration with hematology Prednisone 20 mg tablet reordered, told to take medication as prescribed by hematology.  Overweight (BMI 25.0-29.9) Wt Readings from Last 3 Encounters:  01/28/22 167 lb (75.8 kg)  01/07/22 169 lb (76.7 kg)  12/11/21 168 lb 4.8 oz (76.3 kg)  Patient counseled on carb modified diet, engage in exercises at least 150 minutes weekly as tolerated

## 2022-01-28 NOTE — Assessment & Plan Note (Signed)
Patient alert and oriented x4 Currently taking Namenda 5 mg daily, Aricept 5 mg daily Continue current medications Aricept refilled

## 2022-01-28 NOTE — Patient Instructions (Signed)

## 2022-01-28 NOTE — Assessment & Plan Note (Signed)
Chronic condition on rosuvastatin 20 mg daily Fasting lipid ordered Avoid fatty fried foods

## 2022-01-28 NOTE — Assessment & Plan Note (Signed)
BP Readings from Last 3 Encounters:  01/28/22 132/70  01/09/22 (!) 164/81  01/07/22 134/69  Chronic condition well-controlled on atenolol 50 mg daily, currently not taking hydrochlorothiazide states that hematologist told her to hold off on medication. Continue atenolol 50 mg daily DASH diet advised patient encouraged to exercise at least 150 minutes weekly as tolerated Follow-up in 5 months

## 2022-01-28 NOTE — Assessment & Plan Note (Signed)
Chronic condition Reports feeling much better since she started chemotherapy Patient encouraged to maintain close follow-up with hematologist Appreciate collaboration with hematology Prednisone 20 mg tablet reordered, told to take medication as prescribed by hematology.

## 2022-01-28 NOTE — Assessment & Plan Note (Signed)
Wt Readings from Last 3 Encounters:  01/28/22 167 lb (75.8 kg)  01/07/22 169 lb (76.7 kg)  12/11/21 168 lb 4.8 oz (76.3 kg)  Patient counseled on carb modified diet, engage in exercises at least 150 minutes weekly as tolerated

## 2022-01-29 ENCOUNTER — Inpatient Hospital Stay (HOSPITAL_BASED_OUTPATIENT_CLINIC_OR_DEPARTMENT_OTHER): Payer: Medicare PPO | Admitting: Hematology

## 2022-01-29 ENCOUNTER — Inpatient Hospital Stay (HOSPITAL_COMMUNITY): Payer: Medicare PPO

## 2022-01-29 ENCOUNTER — Inpatient Hospital Stay (HOSPITAL_COMMUNITY): Payer: Medicare PPO | Attending: Hematology

## 2022-01-29 VITALS — BP 136/78 | HR 80 | Temp 97.6°F | Resp 18

## 2022-01-29 VITALS — BP 144/71 | HR 76 | Temp 97.4°F | Resp 18

## 2022-01-29 DIAGNOSIS — Z87891 Personal history of nicotine dependence: Secondary | ICD-10-CM | POA: Diagnosis not present

## 2022-01-29 DIAGNOSIS — Z79899 Other long term (current) drug therapy: Secondary | ICD-10-CM | POA: Diagnosis not present

## 2022-01-29 DIAGNOSIS — Z7982 Long term (current) use of aspirin: Secondary | ICD-10-CM | POA: Diagnosis not present

## 2022-01-29 DIAGNOSIS — Z5189 Encounter for other specified aftercare: Secondary | ICD-10-CM | POA: Insufficient documentation

## 2022-01-29 DIAGNOSIS — I10 Essential (primary) hypertension: Secondary | ICD-10-CM | POA: Diagnosis not present

## 2022-01-29 DIAGNOSIS — Z800 Family history of malignant neoplasm of digestive organs: Secondary | ICD-10-CM | POA: Diagnosis not present

## 2022-01-29 DIAGNOSIS — C8338 Diffuse large B-cell lymphoma, lymph nodes of multiple sites: Secondary | ICD-10-CM

## 2022-01-29 DIAGNOSIS — Z95828 Presence of other vascular implants and grafts: Secondary | ICD-10-CM

## 2022-01-29 DIAGNOSIS — Z5112 Encounter for antineoplastic immunotherapy: Secondary | ICD-10-CM | POA: Insufficient documentation

## 2022-01-29 DIAGNOSIS — C8330 Diffuse large B-cell lymphoma, unspecified site: Secondary | ICD-10-CM | POA: Insufficient documentation

## 2022-01-29 DIAGNOSIS — E785 Hyperlipidemia, unspecified: Secondary | ICD-10-CM

## 2022-01-29 DIAGNOSIS — Z5111 Encounter for antineoplastic chemotherapy: Secondary | ICD-10-CM | POA: Diagnosis not present

## 2022-01-29 DIAGNOSIS — C8510 Unspecified B-cell lymphoma, unspecified site: Secondary | ICD-10-CM

## 2022-01-29 DIAGNOSIS — Z801 Family history of malignant neoplasm of trachea, bronchus and lung: Secondary | ICD-10-CM | POA: Diagnosis not present

## 2022-01-29 DIAGNOSIS — Z7952 Long term (current) use of systemic steroids: Secondary | ICD-10-CM | POA: Insufficient documentation

## 2022-01-29 LAB — CBC WITH DIFFERENTIAL/PLATELET
Abs Immature Granulocytes: 0.03 10*3/uL (ref 0.00–0.07)
Basophils Absolute: 0.1 10*3/uL (ref 0.0–0.1)
Basophils Relative: 2 %
Eosinophils Absolute: 0.1 10*3/uL (ref 0.0–0.5)
Eosinophils Relative: 2 %
HCT: 36.7 % (ref 36.0–46.0)
Hemoglobin: 12 g/dL (ref 12.0–15.0)
Immature Granulocytes: 1 %
Lymphocytes Relative: 15 %
Lymphs Abs: 0.5 10*3/uL — ABNORMAL LOW (ref 0.7–4.0)
MCH: 31.9 pg (ref 26.0–34.0)
MCHC: 32.7 g/dL (ref 30.0–36.0)
MCV: 97.6 fL (ref 80.0–100.0)
Monocytes Absolute: 0.7 10*3/uL (ref 0.1–1.0)
Monocytes Relative: 20 %
Neutro Abs: 2.2 10*3/uL (ref 1.7–7.7)
Neutrophils Relative %: 60 %
Platelets: 169 10*3/uL (ref 150–400)
RBC: 3.76 MIL/uL — ABNORMAL LOW (ref 3.87–5.11)
RDW: 19 % — ABNORMAL HIGH (ref 11.5–15.5)
WBC: 3.6 10*3/uL — ABNORMAL LOW (ref 4.0–10.5)
nRBC: 0 % (ref 0.0–0.2)

## 2022-01-29 LAB — LIPID PANEL
Cholesterol: 219 mg/dL — ABNORMAL HIGH (ref 0–200)
HDL: 66 mg/dL (ref >40)
LDL Cholesterol: 126 mg/dL — ABNORMAL HIGH (ref 0–99)
Total CHOL/HDL Ratio: 3.3 RATIO
Triglycerides: 136 mg/dL (ref <150)
VLDL: 27 mg/dL (ref 0–40)

## 2022-01-29 LAB — COMPREHENSIVE METABOLIC PANEL
ALT: 14 U/L (ref 0–44)
AST: 14 U/L — ABNORMAL LOW (ref 15–41)
Albumin: 4 g/dL (ref 3.5–5.0)
Alkaline Phosphatase: 55 U/L (ref 38–126)
Anion gap: 4 — ABNORMAL LOW (ref 5–15)
BUN: 16 mg/dL (ref 8–23)
CO2: 28 mmol/L (ref 22–32)
Calcium: 8.9 mg/dL (ref 8.9–10.3)
Chloride: 106 mmol/L (ref 98–111)
Creatinine, Ser: 0.68 mg/dL (ref 0.44–1.00)
GFR, Estimated: >60 mL/min (ref >60)
Glucose, Bld: 80 mg/dL (ref 70–99)
Potassium: 3.7 mmol/L (ref 3.5–5.1)
Sodium: 138 mmol/L (ref 135–145)
Total Bilirubin: 0.7 mg/dL (ref 0.3–1.2)
Total Protein: 6.2 g/dL — ABNORMAL LOW (ref 6.5–8.1)

## 2022-01-29 LAB — LACTATE DEHYDROGENASE: LDH: 167 U/L (ref 98–192)

## 2022-01-29 LAB — PHOSPHORUS: Phosphorus: 3.4 mg/dL (ref 2.5–4.6)

## 2022-01-29 LAB — URIC ACID: Uric Acid, Serum: 2.6 mg/dL (ref 2.5–7.1)

## 2022-01-29 LAB — MAGNESIUM: Magnesium: 2.1 mg/dL (ref 1.7–2.4)

## 2022-01-29 MED ORDER — FAMOTIDINE IN NACL 20-0.9 MG/50ML-% IV SOLN
20.0000 mg | Freq: Once | INTRAVENOUS | Status: AC
  Administered 2022-01-29: 20 mg via INTRAVENOUS
  Filled 2022-01-29: qty 50

## 2022-01-29 MED ORDER — SODIUM CHLORIDE 0.9 % IV SOLN
10.0000 mg | Freq: Once | INTRAVENOUS | Status: AC
  Administered 2022-01-29: 10 mg via INTRAVENOUS
  Filled 2022-01-29: qty 10

## 2022-01-29 MED ORDER — PALONOSETRON HCL INJECTION 0.25 MG/5ML
0.2500 mg | Freq: Once | INTRAVENOUS | Status: AC
  Administered 2022-01-29: 0.25 mg via INTRAVENOUS
  Filled 2022-01-29: qty 5

## 2022-01-29 MED ORDER — SODIUM CHLORIDE 0.9 % IV SOLN
750.0000 mg/m2 | Freq: Once | INTRAVENOUS | Status: AC
  Administered 2022-01-29: 1440 mg via INTRAVENOUS
  Filled 2022-01-29: qty 72

## 2022-01-29 MED ORDER — DIPHENHYDRAMINE HCL 25 MG PO CAPS
50.0000 mg | ORAL_CAPSULE | Freq: Once | ORAL | Status: AC
  Administered 2022-01-29: 50 mg via ORAL
  Filled 2022-01-29: qty 2

## 2022-01-29 MED ORDER — SODIUM CHLORIDE 0.9 % IV SOLN: Freq: Once | INTRAVENOUS | Status: AC

## 2022-01-29 MED ORDER — VINCRISTINE SULFATE CHEMO INJECTION 1 MG/ML
1.0000 mg | Freq: Once | INTRAVENOUS | Status: AC
  Administered 2022-01-29: 1 mg via INTRAVENOUS
  Filled 2022-01-29: qty 1

## 2022-01-29 MED ORDER — SODIUM CHLORIDE 0.9% FLUSH
10.0000 mL | INTRAVENOUS | Status: DC | PRN
  Administered 2022-01-29: 10 mL

## 2022-01-29 MED ORDER — DOXORUBICIN HCL CHEMO IV INJECTION 2 MG/ML
25.0000 mg/m2 | Freq: Once | INTRAVENOUS | Status: AC
  Administered 2022-01-29: 48 mg via INTRAVENOUS
  Filled 2022-01-29: qty 24

## 2022-01-29 MED ORDER — ACETAMINOPHEN 325 MG PO TABS
650.0000 mg | ORAL_TABLET | Freq: Once | ORAL | Status: AC
  Administered 2022-01-29: 650 mg via ORAL
  Filled 2022-01-29: qty 2

## 2022-01-29 MED ORDER — SODIUM CHLORIDE 0.9 % IV SOLN
150.0000 mg | Freq: Once | INTRAVENOUS | Status: AC
  Administered 2022-01-29: 150 mg via INTRAVENOUS
  Filled 2022-01-29: qty 150

## 2022-01-29 MED ORDER — SODIUM CHLORIDE 0.9 % IV SOLN
375.0000 mg/m2 | Freq: Once | INTRAVENOUS | Status: AC
  Administered 2022-01-29: 700 mg via INTRAVENOUS
  Filled 2022-01-29: qty 50

## 2022-01-29 MED ORDER — HEPARIN SOD (PORK) LOCK FLUSH 100 UNIT/ML IV SOLN
500.0000 [IU] | Freq: Once | INTRAVENOUS | Status: AC | PRN
  Administered 2022-01-29: 500 [IU]

## 2022-01-29 NOTE — Patient Instructions (Signed)
[facility]  Discharge Instructions: Thank you for choosing [facility] to provide your oncology and hematology care.  If you have a lab appointment with the [facility], please come in thru the Main Entrance and check in at the main information desk.  Wear comfortable clothing and clothing appropriate for easy access to any Portacath or PICC line.   We strive to give you quality time with your provider. You may need to reschedule your appointment if you arrive late (15 or more minutes).  Arriving late affects you and other patients whose appointments are after yours.  Also, if you miss three or more appointments without notifying the office, you may be dismissed from the clinic at the provider's discretion.      For prescription refill requests, have your pharmacy contact our office and allow 72 hours for refills to be completed.    Today you received the following chemotherapy and/or immunotherapy agents R-CHOP   To help prevent nausea and vomiting after your treatment, we encourage you to take your nausea medication as directed.  BELOW ARE SYMPTOMS THAT SHOULD BE REPORTED IMMEDIATELY: *FEVER GREATER THAN 100.4 F (38 C) OR HIGHER *CHILLS OR SWEATING *NAUSEA AND VOMITING THAT IS NOT CONTROLLED WITH YOUR NAUSEA MEDICATION *UNUSUAL SHORTNESS OF BREATH *UNUSUAL BRUISING OR BLEEDING *URINARY PROBLEMS (pain or burning when urinating, or frequent urination) *BOWEL PROBLEMS (unusual diarrhea, constipation, pain near the anus) TENDERNESS IN MOUTH AND THROAT WITH OR WITHOUT PRESENCE OF ULCERS (sore throat, sores in mouth, or a toothache) UNUSUAL RASH, SWELLING OR PAIN  UNUSUAL VAGINAL DISCHARGE OR ITCHING   Items with * indicate a potential emergency and should be followed up as soon as possible or go to the Emergency Department if any problems should occur.  Please show the CHEMOTHERAPY ALERT CARD or IMMUNOTHERAPY ALERT CARD at check-in to the Emergency Department  and triage nurse.  Should you have questions after your visit or need to cancel or reschedule your appointment, please contact [facility] [phone]  and follow the prompts.  Office hours are 8:00 a.m. to 4:30 p.m. Monday - Friday. Please note that voicemails left after 4:00 p.m. may not be returned until the following business day.  We are closed weekends and major holidays. You have access to a nurse at all times for urgent questions. Please call the main number to the clinic [phone] and follow the prompts.  For any non-urgent questions, you may also contact your provider using MyChart. We now offer e-Visits for anyone 78 and older to request care online for non-urgent symptoms. For details visit [email or web address].   Also download the MyChart app! Go to the app store, search "MyChart", open the app, select [facility], and log in with your MyChart username and password.  Due to Covid, a mask is required upon entering the hospital/clinic. If you do not have a mask, one will be given to you upon arrival. For doctor visits, patients may have 1 support person aged 78 or older with them. For treatment visits, patients cannot have anyone with them due to current Covid guidelines and our immunocompromised population.

## 2022-01-29 NOTE — Progress Notes (Signed)
[name], Molly Patel   CLINIC:  Medical Oncology/Hematology  PCP:  Renee Rival, FNP [address] [place] / [place] [address] [phone]   REASON FOR VISIT:  Follow-up for large B-cell lymphoma  PRIOR THERAPY: none  NGS Results: not  done  CURRENT THERAPY: R-CHOP q21d  BRIEF ONCOLOGIC HISTORY:  Oncology History  Diffuse large B-cell lymphoma ([place])  10/18/2021 Initial Diagnosis   Diffuse large B-cell lymphoma ([place])    10/30/2021 -  Chemotherapy   Patient is on Treatment Plan : NON-HODGKINS LYMPHOMA R-CHOP q21d        CANCER STAGING:  Cancer Staging  Diffuse large B-cell lymphoma ([place]) Staging form: Hodgkin and Non-Hodgkin Lymphoma, AJCC 8th Edition - Clinical stage from 10/22/2021: Stage IV (Diffuse large B-cell lymphoma) - Unsigned   INTERVAL HISTORY:  Molly Patel, a 76 y.o. female, returns for routine follow-up and consideration for next cycle of chemotherapy. Molly Patel was last seen on 01/07/2022.  Due for cycle #5 of R-CHOP today.   Overall, Molly Patel tells me Molly Patel has been feeling pretty well. Molly Patel denies nausea, vomiting, and falls. Molly Patel reports occasional trouble swallowing. Molly Patel denies dysuria. Molly Patel is eating well.   Overall, Molly Patel feels ready for next cycle of chemo today.    REVIEW OF SYSTEMS:  Review of Systems  Constitutional:  Negative for appetite change and fatigue.  HENT:   Positive for trouble swallowing.   Gastrointestinal:  Negative for nausea and vomiting.  Genitourinary:  Positive for frequency. Negative for dysuria.   Neurological:  Negative for numbness.  Psychiatric/Behavioral:  Positive for sleep disturbance.   All other systems reviewed and are negative.  PAST MEDICAL/SURGICAL HISTORY:  Past Medical History:  Diagnosis Date   Angioedema    B-cell lymphoma ([place])    Depression    Dizziness    Echocardiogram with ECG monitoring 02/23/2011   mild LVH, normal event  monitor    Hyperlipidemia    Hypertension    takes meds daily   Incontinence    Insomnia    Ovarian cyst    Port-A-Cath in place 10/29/2021   Seizures ([place])    2 weeks ago   Stroke ([place]) 08/25/2014   Syncope    Vitamin D deficiency    Past Surgical History:  Procedure Laterality Date   ABDOMINAL HYSTERECTOMY     COLONOSCOPY N/A 11/02/2014   Procedure: COLONOSCOPY;  Surgeon: Rogene Houston, MD;  [facility];  Service: Endoscopy;  Laterality: N/A;  140 - moved to 12:55 - Ann to notify pt   CRANIOTOMY  06/29/2012   Procedure: CRANIOTOMY TUMOR EXCISION;  Surgeon: Faythe Ghee, MD;  Location: [facility];  Service: Neurosurgery;  Laterality: Left;  Left frontal Craniotomy for Meningioma   ESOPHAGEAL DILATION N/A 11/02/2014   Procedure: ESOPHAGEAL DILATION;  Surgeon: Rogene Houston, MD;  Location: [facility];  Service: Endoscopy;  Laterality: N/A;   ESOPHAGOGASTRODUODENOSCOPY N/A 11/02/2014   Procedure: ESOPHAGOGASTRODUODENOSCOPY (EGD);  Surgeon: Rogene Houston, MD;  Location[facility];  Service: Endoscopy;  Laterality: N/A;   PORTACATH PLACEMENT Left 10/18/2021   Procedure: INSERTION PORT-A-CATH;  Surgeon: Aviva Signs, MD;  Location: [facility];  Service: General;  Laterality: Left;   VESICOVAGINAL FISTULA CLOSURE W/ TAH      SOCIAL HISTORY:  Social History   Socioeconomic History   Marital status: Divorced    Spouse name: Not on file   Number of  children: 2   Years of education: Not on file   Highest education level: Not on file  Occupational History   Occupation: Retired    Comment: Tree surgeon  Tobacco Use   Smoking status: Former    Years: 7.00    Types: Cigarettes    Quit date: 08/25/1985    Years since quitting: 36.4   Smokeless tobacco: Never  Vaping Use   Vaping Use: Never used  Substance and Sexual Activity   Alcohol use: No   Drug use: No   Sexual activity: Not on file  Other Topics Concern   Not on file  Social History Narrative    Not on file   Social Determinants of Health   Financial Resource Strain: Not on file  Food Insecurity: Not on file  Transportation Needs: Not on file  Physical Activity: Not on file  Stress: Not on file  Social Connections: Not on file  Intimate Partner Violence: Not on file    FAMILY HISTORY:  Family History  Problem Relation Age of Onset   Stroke Father    Asthma Grandchild    Arthritis Sister    Diabetes Sister    Colon cancer Brother     CURRENT MEDICATIONS:  Current Outpatient Medications  Medication Sig Dispense Refill   allopurinol (ZYLOPRIM) 300 MG tablet TAKE 1 TABLET BY MOUTH EVERY DAY 30 tablet 2   aspirin EC 325 MG tablet Take 1 tablet (325 mg total) by mouth daily. 30 tablet 0   atenolol (TENORMIN) 50 MG tablet TAKE 1 TABLET EVERY DAY (Patient taking differently: Take 50 mg by mouth daily.) 90 tablet 2   cholecalciferol (VITAMIN D) 25 MCG (1000 UNIT) tablet TAKE 1 TABLET EVERY DAY (Patient taking differently: 1,000 Units daily.) 90 tablet 3   CYCLOPHOSPHAMIDE IV Inject into the vein every 21 ( twenty-one) days.     donepezil (ARICEPT) 5 MG tablet Take 1 tablet (5 mg total) by mouth at bedtime. 90 tablet 1   DOXOrubicin HCl (ADRIAMYCIN IV) Inject into the vein every 21 ( twenty-one) days.     hydrochlorothiazide (MICROZIDE) 12.5 MG capsule TAKE 1 CAPSULE EVERY DAY (Patient not taking: Reported on 01/28/2022) 90 capsule 0   HYDROcodone-acetaminophen (NORCO/VICODIN) 5-325 MG tablet Take 1 tablet by mouth every 6 (six) hours as needed for moderate pain or severe pain. 20 tablet 0   Lactulose 20 GM/30ML SOLN Take 30 mLs (20 g total) by mouth 3 (three) times daily as needed. (Patient not taking: Reported on 01/28/2022) 450 mL 0   levETIRAcetam (KEPPRA) 500 MG tablet Take 1 tablet (500 mg total) by mouth 2 (two) times daily. Needs to establish with new PCP.  Dr. Buelah Manis is no longer at this office.  Must be seen before more refills. 60 tablet 0   lidocaine-prilocaine (EMLA)  cream Apply a small amount to port a cath site and cover with plastic wrap 1 hour prior to infusion appointments 30 g 3   memantine (NAMENDA) 5 MG tablet Take 5 mg by mouth daily.     predniSONE (DELTASONE) 20 MG tablet Take 5 tablets (100 mg) by mouth daily for 5 days starting on the first day of each treatment.  DO NOT take on the weeks you are not receiving chemotherapy treatment. 30 tablet 0   prochlorperazine (COMPAZINE) 10 MG tablet Take 1 tablet (10 mg total) by mouth every 6 (six) hours as needed (Nausea or vomiting). (Patient not taking: Reported on 01/28/2022) 30 tablet 6  riTUXimab (RITUXAN IV) Inject into the vein every 21 ( twenty-one) days.     rosuvastatin (CRESTOR) 20 MG tablet Take 1 tablet (20 mg total) by mouth once a week. Thursday 90 tablet 1   traZODone (DESYREL) 50 MG tablet Take 0.5-1 tablets (25-50 mg total) by mouth at bedtime as needed for sleep. 30 tablet 0   VINCRISTINE SULFATE IV Inject into the vein every 21 ( twenty-one) days.     No current facility-administered medications for this visit.    ALLERGIES:  No Known Allergies  PHYSICAL EXAM:  Performance status (ECOG): 2 - Symptomatic, <50% confined to bed  There were no vitals filed for this visit. Wt Readings from Last 3 Encounters:  01/28/22 167 lb (75.8 kg)  01/07/22 169 lb (76.7 kg)  12/11/21 168 lb 4.8 oz (76.3 kg)   Physical Exam Vitals reviewed.  Constitutional:      Appearance: Normal appearance.  Cardiovascular:     Rate and Rhythm: Normal rate and regular rhythm.     Pulses: Normal pulses.     Heart sounds: Normal heart sounds.  Pulmonary:     Effort: Pulmonary effort is normal.     Breath sounds: Normal breath sounds.  Neurological:     General: No focal deficit present.     Mental Status: Molly Patel is alert and oriented to person, place, and time.  Psychiatric:        Mood and Affect: Mood normal.        Behavior: Behavior normal.    LABORATORY DATA:  I have reviewed the labs as listed.      Latest Ref Rng & Units 01/07/2022    8:08 AM 12/11/2021    8:48 AM 11/20/2021    9:38 AM  CBC  WBC 4.0 - 10.5 K/uL 3.7   7.4   6.0    Hemoglobin 12.0 - 15.0 g/dL 11.7   12.1   12.6    Hematocrit 36.0 - 46.0 % 36.2   37.8   38.8    Platelets 150 - 400 K/uL 155   141   150        Latest Ref Rng & Units 01/07/2022    8:08 AM 12/11/2021    8:48 AM 11/20/2021    9:38 AM  CMP  Glucose 70 - 99 mg/dL 130   98   87    BUN 8 - 23 mg/dL 17   14   8     Creatinine 0.44 - 1.00 mg/dL 0.78   0.70   0.57    Sodium 135 - 145 mmol/L 138   139   132    Potassium 3.5 - 5.1 mmol/L 3.3   3.3   3.6    Chloride 98 - 111 mmol/L 106   103   100    CO2 22 - 32 mmol/L 27   29   29     Calcium 8.9 - 10.3 mg/dL 9.0   9.0   8.5    Total Protein 6.5 - 8.1 g/dL 6.4   6.1   6.0    Total Bilirubin 0.3 - 1.2 mg/dL 0.5   0.4   0.5    Alkaline Phos 38 - 126 U/L 56   65   72    AST 15 - 41 U/L 17   15   23     ALT 0 - 44 U/L 12   12   24       DIAGNOSTIC IMAGING:  I have independently reviewed  the scans and discussed with the patient. NM PET Image Restag (PS) Skull Base To Thigh  Result Date: 01/05/2022 CLINICAL DATA:  Subsequent treatment strategy for hematologic malignancy, B-cell lymphoma. EXAM: NUCLEAR MEDICINE PET SKULL BASE TO THIGH TECHNIQUE: 9.62 mCi F-18 FDG was injected intravenously. Full-ring PET imaging was performed from the skull base to thigh after the radiotracer. CT data was obtained and used for attenuation correction and anatomic localization. Fasting blood glucose: 90 mg/dl COMPARISON:  10/17/2021. FINDINGS: Mediastinal blood pool activity: SUV max 2.38 Liver activity: SUV max 3.16 NECK: No hypermetabolic lymph nodes in the neck. Incidental CT findings: Hypoattenuation in LEFT frontal lobe similar to previous imaging, not well evaluated and noted on previous brain MRI compatible with prior infarct. CHEST: No hypermetabolic mediastinal or hilar nodes. No suspicious pulmonary nodules on the CT scan. RIGHT  retrocrural thickening and RIGHT pleural thickening is markedly diminished. Mild increased FDG uptake around the periphery of the posterior RIGHT chest with marked interval decrease since previous imaging. Maximal thickness (image 116/3) approximally 1.6 cm, previously as much as 3.1 cm in this location with a maximum SUV of 2.28, previously as much as 20.74. Minimal residual soft tissue along the RIGHT internal mammary vessels (image 81/3) profound thickening of the chest wall and extrapleural nodal tissue (image 97/3 on the previous study currently approximately 6 mm as compared to 16 mm thickness on the prior exam without substantial increased metabolic activity. RIGHT cardiophrenic soft tissue measuring approximately 1.1 cm greatest thickness, previously 3.5 cm with a maximum SUV on today's exam of approximately 2.01, previously markedly increased and similar to RIGHT retrocrural uptake on FDG. Incidental CT findings: LEFT-sided Port-A-Cath terminates at the caval to atrial junction. Heart size top normal, no pericardial effusion. Normal caliber of the thoracic aorta. Normal caliber of central pulmonary vessels. No axillary adenopathy or thoracic inlet adenopathy. Volume loss on the RIGHT associated with moderately large pleural effusion in the RIGHT chest that is larger than in February 2023 but with less associated soft tissue as outlined above. Basilar atelectasis on the LEFT. ABDOMEN/PELVIS: Marked interval improvement with respect to soft tissue and large nodal conglomerate in the upper abdomen. No discrete measurable lymph node. RIGHT juxta caval soft tissue shows near complete resolution with minimal loss of the fat plane between the RIGHT diaphragmatic crus and the cava currently 8 mm greatest thickness where there was previously confluent soft tissue measuring up to 5.5 cm in greatest axial dimension. Metabolic activity is similar to that of adjacent blood pool difficult to measure given small size of  this area on the current study. Hazy mesenteric changes at the site of soft tissue and adenopathy in the mesentery of the small bowel (image 158/3) 9 mm as compared to 18 mm greatest thickness this shows a maximum SUV of 1.91. Moderate to marked increased short segment activity in the RIGHT hemicolon (image 164/3) this is just above the ileocecal valve and shows no discrete lesion and luminal collapse. Mild pericolonic stranding is suggested. No focal increased metabolic activity in the solid organs of the abdomen. Incidental CT findings: No acute findings relative to liver, gallbladder, pancreas, spleen, adrenal glands or kidneys. Urinary bladder without signs of gross thickening or adjacent inflammation. Gastrointestinal tract otherwise unremarkable aside from findings discussed above. Aortic atherosclerosis. No adnexal masses. SKELETON: Areas of focal skeletal activity seen on previous imaging have resolved. There is mild generalized increased marrow space activity with some mild heterogeneity but without discrete focal abnormality. Activity over the RIGHT hand  likely reflects small amount of extravasation related to radiotracer injection. Focal area in the L5 vertebral level previously showed a maximum SUV of 7.09 now at 4.26 which is similar to many other levels of the spine again with generalized heterogeneity and some areas of slightly less metabolic activity. Bilateral proximal femora without focal activity, symmetric activity in these areas on the current exam maximum SUV on the LEFT with focal lesion in this area previously 10.21 as compared to 3.98 currently. Incidental CT findings: none IMPRESSION: Marked interval response to therapy. Disease associated with soft tissues with Deauville criteria 2. Resolution of hypermetabolic changes in the spleen, splenic activity is currently visibly less than liver. Still with moderate FDG uptake throughout skeletal structures. Activity is heterogeneous but nonfocal.  Findings may reflect persistent diffuse lymphomatous involvement versus sequela of marrow stimulation on a background of response to therapy, attention on follow-up is suggested. Short segment increased metabolic activity in the RIGHT colon is of uncertain significance. Correlate with any abdominal symptoms and consider correlation with direct visualization. Electronically Signed   By: Zetta Bills M.D.   On: 01/05/2022 19:51     ASSESSMENT:  Stage IIIb DLBCL: - Weakness, 12 pound weight loss over the last 2 months - CT renal study on 09/24/2021: Right retroperitoneal nodal mass measuring 4.4 x 5.7 x 7.1 cm.  Enlarged lymph node mass in the central small bowel mesentery measuring 2.1 x 6.3 x 7.1 cm.  Right pelvic sidewall lymph node borderline enlarged measuring 1 cm.  Indeterminate ovoid mass in the left adnexal region measuring 3.7 x 2.1 cm. - CT chest angiogram on 09/25/2021 negative for PE.  Large right pleural effusion with partial collapse of the right lung.  Partially seen large right retroperitoneal soft tissue mass measuring 4.4 x 5.8 x 7 cm extends superiorly along the right para-aortic region through the diaphragmatic hiatus into the thorax and pleural space.  Severe soft tissue pleural thickening along the posterior aspect of the right pleural space measuring 2.8 cm in maximal thickness.  4.2 x 3.1 cm right pleural-based mass along the anteromedial aspect abutting the pericardium. - Right pleural fluid flow cytometry-monoclonal B-cell population with coexpression of CD10 comprising 69% of all lymphocytes. - Right retroperitoneal mass needle biopsy on 09/26/2021 consistent with large B-cell type (GCB subtype) is favored.  Large lymphoid cells are positive for CD79a, CD20, CD10, BCL6 and Bcl-2.  Ki-67 up to 50% in some areas. - R mini CHOP cycle 1 on 10/31/2021.    Social/family history: - Molly Patel [facility] of [facility]. - Molly Patel is currently  living with her sister and niece.  Niece takes care of her.  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  No exposure to chemicals.  Non-smoker. - Brother had throat cancer.  Another brother had liver cancer.   PLAN:  Stage IVb diffuse large B-cell lymphoma: -PET scan (01/02/2022) showed marked interval response to therapy, Deauville 2. - Tolerated cycle 4 reasonably well.  Molly Patel is continuing to live independently.  No neuropathy symptoms or GI symptoms. - Reviewed labs today which showed normal LFTs.  LDH was normal.  Hemoglobin normalized. - Recommend proceeding with cycle 5 today.  Cyclophosphamide will be regular dose.  RTC 3 weeks for follow-up with repeat labs.  2.  Right pleural effusion: -Last thoracentesis on 11/08/2021.  No worsening shortness of breath reported.  3.  Right epidural tumor at T9-10: -MRI T-spine on  12/27/2021: Persistent but decreased right paraspinal enhancing tissue compared to 11/06/2021.  4.  TLS prophylaxis: -Continue allopurinol.  Uric acid normal.  5.  Hypertension: -Hold HCTZ.  Blood pressure today is 136/78.   Orders placed this encounter:  No orders of the defined types were placed in this encounter.    Derek Jack, MD [facility] [phone]   I, Thana Ates, am acting as a scribe for Dr. Derek Jack.  I, Derek Jack MD, have reviewed the above documentation for accuracy and completeness, and I agree with the above.

## 2022-01-29 NOTE — Progress Notes (Signed)
LDL not at goal of less than 55 due to past history of CVA.  I have called patient and spoken to her.  Patient currently taking rosuvastatin 20 mg once weekly.  I told patient to start taking rosuvastatin 20 mg daily, patient will follow-up in 2 months.  Please schedule a 55-monthfollow-up for hyperlipidemia.  Thank you

## 2022-01-29 NOTE — Patient Instructions (Addendum)
[place] at [facility] Discharge Instructions   You were seen and examined today by Dr. Delton Coombes.  He reviewed your lab work which is normal/stable.   We will proceed with your treatment today.  Return as scheduled for office visit and treatment.    Thank you for choosing [place] at [facility] to provide your oncology and hematology care.  To afford each patient quality time with our provider, please arrive at least 15 minutes before your scheduled appointment time.   If you have a lab appointment with the [facility] please come in thru the Main Entrance and check in at the main information desk.  You need to re-schedule your appointment should you arrive 10 or more minutes late.  We strive to give you quality time with our providers, and arriving late affects you and other patients whose appointments are after yours.  Also, if you no show three or more times for appointments you may be dismissed from the clinic at the providers discretion.     Again, thank you for choosing [facility].  Our hope is that these requests will decrease the amount of time that you wait before being seen by our physicians.       _____________________________________________________________  Should you have questions after your visit to [facility], please contact our office at [phone] and follow the prompts.  Our office hours are 8:00 a.m. and 4:30 p.m. Monday - Friday.  Please note that voicemails left after 4:00 p.m. may not be returned until the following business day.  We are closed weekends and major holidays.  You do have access to a nurse 24-7, just call the main number to the clinic [phone] and do not press any options, hold on the line and a nurse will answer the phone.    For prescription refill requests, have your pharmacy contact our office and allow 72 hours.    Due to Covid, you will need to wear a mask  upon entering the hospital. If you do not have a mask, a mask will be given to you at the Main Entrance upon arrival. For doctor visits, patients may have 1 support person age 78 or older with them. For treatment visits, patients can not have anyone with them due to social distancing guidelines and our immunocompromised population.

## 2022-01-29 NOTE — Progress Notes (Addendum)
Pt presents today for RCHOP per provider's order. Labs and vital signs WNL for treatment. Okay to proceed with treatment today per Dr.K.  RCHOP given today per MD orders. Tolerated infusion without adverse affects. Vital signs stable. No complaints at this time. Discharged from clinic ambulatory in stable condition. Alert and oriented x 3. F/U with [facility] as scheduled.

## 2022-01-29 NOTE — Progress Notes (Signed)
Dr Delton Coombes notified pharmacy of increase to standard dosing for cyclophosphamide today.  Cyclophosphamide 750 mg/m2 IVPB   Henreitta Leber, PharmD 01/29/22 @ 1000

## 2022-01-29 NOTE — Progress Notes (Signed)
Patients port flushed without difficulty.  Good blood return noted with no bruising or swelling noted at site.  Patient remains accessed for chemotherapy treatment.  

## 2022-01-31 ENCOUNTER — Inpatient Hospital Stay (HOSPITAL_COMMUNITY): Payer: Medicare PPO

## 2022-01-31 VITALS — BP 120/67 | HR 76 | Temp 97.2°F | Resp 18

## 2022-01-31 DIAGNOSIS — Z5111 Encounter for antineoplastic chemotherapy: Secondary | ICD-10-CM | POA: Diagnosis not present

## 2022-01-31 DIAGNOSIS — Z5189 Encounter for other specified aftercare: Secondary | ICD-10-CM | POA: Diagnosis not present

## 2022-01-31 DIAGNOSIS — Z95828 Presence of other vascular implants and grafts: Secondary | ICD-10-CM

## 2022-01-31 DIAGNOSIS — Z79899 Other long term (current) drug therapy: Secondary | ICD-10-CM | POA: Diagnosis not present

## 2022-01-31 DIAGNOSIS — C8338 Diffuse large B-cell lymphoma, lymph nodes of multiple sites: Secondary | ICD-10-CM

## 2022-01-31 DIAGNOSIS — Z7952 Long term (current) use of systemic steroids: Secondary | ICD-10-CM | POA: Diagnosis not present

## 2022-01-31 DIAGNOSIS — I10 Essential (primary) hypertension: Secondary | ICD-10-CM | POA: Diagnosis not present

## 2022-01-31 DIAGNOSIS — Z7982 Long term (current) use of aspirin: Secondary | ICD-10-CM | POA: Diagnosis not present

## 2022-01-31 DIAGNOSIS — Z87891 Personal history of nicotine dependence: Secondary | ICD-10-CM | POA: Diagnosis not present

## 2022-01-31 DIAGNOSIS — Z5112 Encounter for antineoplastic immunotherapy: Secondary | ICD-10-CM | POA: Diagnosis not present

## 2022-01-31 DIAGNOSIS — C8330 Diffuse large B-cell lymphoma, unspecified site: Secondary | ICD-10-CM | POA: Diagnosis not present

## 2022-01-31 MED ORDER — PEGFILGRASTIM-CBQV 6 MG/0.6ML ~~LOC~~ SOSY
6.0000 mg | PREFILLED_SYRINGE | Freq: Once | SUBCUTANEOUS | Status: AC
  Administered 2022-01-31: 6 mg via SUBCUTANEOUS
  Filled 2022-01-31: qty 0.6

## 2022-01-31 NOTE — Progress Notes (Signed)
Molly Patel presents today for injection per the provider's orders.  Udenyca administration without incident; injection site WNL; see MAR for injection details.  Patient tolerated procedure well and without incident.  No questions or complaints noted at this time.   Discharged from clinic ambulatory in stable condition. Alert and oriented x 3. F/U with [facility] as scheduled.

## 2022-01-31 NOTE — Patient Instructions (Signed)
[facility]  Discharge Instructions: Thank you for choosing [facility] to provide your oncology and hematology care.  If you have a lab appointment with the [facility], please come in thru the Main Entrance and check in at the main information desk.  Wear comfortable clothing and clothing appropriate for easy access to any Portacath or PICC line.   We strive to give you quality time with your provider. You may need to reschedule your appointment if you arrive late (15 or more minutes).  Arriving late affects you and other patients whose appointments are after yours.  Also, if you miss three or more appointments without notifying the office, you may be dismissed from the clinic at the provider's discretion.      For prescription refill requests, have your pharmacy contact our office and allow 72 hours for refills to be completed.    Today you received the following chemotherapy and/or immunotherapy agents Udenyca injection.   BELOW ARE SYMPTOMS THAT SHOULD BE REPORTED IMMEDIATELY: *FEVER GREATER THAN 100.4 F (38 C) OR HIGHER *CHILLS OR SWEATING *NAUSEA AND VOMITING THAT IS NOT CONTROLLED WITH YOUR NAUSEA MEDICATION *UNUSUAL SHORTNESS OF BREATH *UNUSUAL BRUISING OR BLEEDING *URINARY PROBLEMS (pain or burning when urinating, or frequent urination) *BOWEL PROBLEMS (unusual diarrhea, constipation, pain near the anus) TENDERNESS IN MOUTH AND THROAT WITH OR WITHOUT PRESENCE OF ULCERS (sore throat, sores in mouth, or a toothache) UNUSUAL RASH, SWELLING OR PAIN  UNUSUAL VAGINAL DISCHARGE OR ITCHING   Items with * indicate a potential emergency and should be followed up as soon as possible or go to the Emergency Department if any problems should occur.  Please show the CHEMOTHERAPY ALERT CARD or IMMUNOTHERAPY ALERT CARD at check-in to the Emergency Department and triage nurse.  Should you have questions after your visit or need to cancel or reschedule your  appointment, please contact [facility] [phone]  and follow the prompts.  Office hours are 8:00 a.m. to 4:30 p.m. Monday - Friday. Please note that voicemails left after 4:00 p.m. may not be returned until the following business day.  We are closed weekends and major holidays. You have access to a nurse at all times for urgent questions. Please call the main number to the clinic [phone] and follow the prompts.  For any non-urgent questions, you may also contact your provider using MyChart. We now offer e-Visits for anyone 77 and older to request care online for non-urgent symptoms. For details visit [email or web address].   Also download the MyChart app! Go to the app store, search "MyChart", open the app, select [facility], and log in with your MyChart username and password.  Due to Covid, a mask is required upon entering the hospital/clinic. If you do not have a mask, one will be given to you upon arrival. For doctor visits, patients may have 1 support person aged 76 or older with them. For treatment visits, patients cannot have anyone with them due to current Covid guidelines and our immunocompromised population.

## 2022-02-17 ENCOUNTER — Ambulatory Visit (INDEPENDENT_AMBULATORY_CARE_PROVIDER_SITE_OTHER): Payer: Medicare PPO

## 2022-02-17 DIAGNOSIS — Z0000 Encounter for general adult medical examination without abnormal findings: Secondary | ICD-10-CM | POA: Diagnosis not present

## 2022-02-17 MED ORDER — ROSUVASTATIN CALCIUM 20 MG PO TABS: 20.0000 mg | ORAL_TABLET | Freq: Every day | ORAL | 1 refills | Status: DC

## 2022-02-18 ENCOUNTER — Other Ambulatory Visit: Payer: Self-pay | Admitting: Nurse Practitioner

## 2022-02-18 ENCOUNTER — Other Ambulatory Visit: Payer: Self-pay

## 2022-02-18 ENCOUNTER — Telehealth: Payer: Self-pay

## 2022-02-18 MED ORDER — ROSUVASTATIN CALCIUM 20 MG PO TABS: ORAL_TABLET | ORAL | 1 refills | Status: DC

## 2022-02-18 NOTE — Telephone Encounter (Signed)
Contacted pharmacy pt's rx states to take 1 tablet on Thursdays is she suppose to have this once a week or daily? Please advise

## 2022-02-18 NOTE — Telephone Encounter (Signed)
Patient called said [facility] has not received this medicine.  When patient called [facility] was  told her she has already had this medicine, but she has not.  Asked if nurse will contact pharmacy.  rosuvastatin (CRESTOR) 20 MG tablet  Pharmacy: [facility]

## 2022-02-19 ENCOUNTER — Inpatient Hospital Stay (HOSPITAL_BASED_OUTPATIENT_CLINIC_OR_DEPARTMENT_OTHER): Payer: Medicare PPO | Admitting: Hematology

## 2022-02-19 ENCOUNTER — Inpatient Hospital Stay (HOSPITAL_COMMUNITY): Payer: Medicare PPO

## 2022-02-19 VITALS — BP 149/72 | HR 64 | Temp 98.0°F | Resp 18 | Wt 169.2 lb

## 2022-02-19 VITALS — BP 141/71 | HR 65 | Temp 97.3°F | Resp 18 | Ht 66.54 in

## 2022-02-19 DIAGNOSIS — Z7952 Long term (current) use of systemic steroids: Secondary | ICD-10-CM | POA: Diagnosis not present

## 2022-02-19 DIAGNOSIS — Z79899 Other long term (current) drug therapy: Secondary | ICD-10-CM | POA: Diagnosis not present

## 2022-02-19 DIAGNOSIS — Z5189 Encounter for other specified aftercare: Secondary | ICD-10-CM | POA: Diagnosis not present

## 2022-02-19 DIAGNOSIS — I10 Essential (primary) hypertension: Secondary | ICD-10-CM | POA: Diagnosis not present

## 2022-02-19 DIAGNOSIS — Z5112 Encounter for antineoplastic immunotherapy: Secondary | ICD-10-CM | POA: Diagnosis not present

## 2022-02-19 DIAGNOSIS — C8330 Diffuse large B-cell lymphoma, unspecified site: Secondary | ICD-10-CM | POA: Diagnosis not present

## 2022-02-19 DIAGNOSIS — C8338 Diffuse large B-cell lymphoma, lymph nodes of multiple sites: Secondary | ICD-10-CM

## 2022-02-19 DIAGNOSIS — Z5111 Encounter for antineoplastic chemotherapy: Secondary | ICD-10-CM | POA: Diagnosis not present

## 2022-02-19 DIAGNOSIS — C8510 Unspecified B-cell lymphoma, unspecified site: Secondary | ICD-10-CM

## 2022-02-19 DIAGNOSIS — Z87891 Personal history of nicotine dependence: Secondary | ICD-10-CM | POA: Diagnosis not present

## 2022-02-19 DIAGNOSIS — Z7982 Long term (current) use of aspirin: Secondary | ICD-10-CM | POA: Diagnosis not present

## 2022-02-19 DIAGNOSIS — Z95828 Presence of other vascular implants and grafts: Secondary | ICD-10-CM

## 2022-02-19 LAB — COMPREHENSIVE METABOLIC PANEL
ALT: 16 U/L (ref 0–44)
AST: 19 U/L (ref 15–41)
Albumin: 3.7 g/dL (ref 3.5–5.0)
Alkaline Phosphatase: 57 U/L (ref 38–126)
Anion gap: 9 (ref 5–15)
BUN: 11 mg/dL (ref 8–23)
CO2: 26 mmol/L (ref 22–32)
Calcium: 8.5 mg/dL — ABNORMAL LOW (ref 8.9–10.3)
Chloride: 102 mmol/L (ref 98–111)
Creatinine, Ser: 0.76 mg/dL (ref 0.44–1.00)
GFR, Estimated: >60 mL/min (ref >60)
Glucose, Bld: 111 mg/dL — ABNORMAL HIGH (ref 70–99)
Potassium: 3.5 mmol/L (ref 3.5–5.1)
Sodium: 137 mmol/L (ref 135–145)
Total Bilirubin: 0.7 mg/dL (ref 0.3–1.2)
Total Protein: 6.1 g/dL — ABNORMAL LOW (ref 6.5–8.1)

## 2022-02-19 LAB — CBC WITH DIFFERENTIAL/PLATELET
Abs Immature Granulocytes: 0.2 10*3/uL — ABNORMAL HIGH (ref 0.00–0.07)
Band Neutrophils: 2 %
Basophils Absolute: 0 10*3/uL (ref 0.0–0.1)
Basophils Relative: 0 %
Eosinophils Absolute: 0 10*3/uL (ref 0.0–0.5)
Eosinophils Relative: 1 %
HCT: 35.4 % — ABNORMAL LOW (ref 36.0–46.0)
Hemoglobin: 11.9 g/dL — ABNORMAL LOW (ref 12.0–15.0)
Lymphocytes Relative: 7 %
Lymphs Abs: 0.3 10*3/uL — ABNORMAL LOW (ref 0.7–4.0)
MCH: 32.9 pg (ref 26.0–34.0)
MCHC: 33.6 g/dL (ref 30.0–36.0)
MCV: 97.8 fL (ref 80.0–100.0)
Metamyelocytes Relative: 3 %
Monocytes Absolute: 0.4 10*3/uL (ref 0.1–1.0)
Monocytes Relative: 12 %
Myelocytes: 2 %
Neutro Abs: 2.8 10*3/uL (ref 1.7–7.7)
Neutrophils Relative %: 73 %
Platelets: 181 10*3/uL (ref 150–400)
RBC: 3.62 MIL/uL — ABNORMAL LOW (ref 3.87–5.11)
RDW: 16.3 % — ABNORMAL HIGH (ref 11.5–15.5)
WBC: 3.7 10*3/uL — ABNORMAL LOW (ref 4.0–10.5)
nRBC: 0 % (ref 0.0–0.2)

## 2022-02-19 LAB — MAGNESIUM: Magnesium: 1.9 mg/dL (ref 1.7–2.4)

## 2022-02-19 LAB — URIC ACID: Uric Acid, Serum: 5 mg/dL (ref 2.5–7.1)

## 2022-02-19 LAB — LACTATE DEHYDROGENASE: LDH: 246 U/L — ABNORMAL HIGH (ref 98–192)

## 2022-02-19 LAB — PHOSPHORUS: Phosphorus: 2.8 mg/dL (ref 2.5–4.6)

## 2022-02-19 MED ORDER — PALONOSETRON HCL INJECTION 0.25 MG/5ML
0.2500 mg | Freq: Once | INTRAVENOUS | Status: AC
  Administered 2022-02-19: 0.25 mg via INTRAVENOUS
  Filled 2022-02-19: qty 5

## 2022-02-19 MED ORDER — SODIUM CHLORIDE 0.9 % IV SOLN
150.0000 mg | Freq: Once | INTRAVENOUS | Status: AC
  Administered 2022-02-19: 150 mg via INTRAVENOUS
  Filled 2022-02-19: qty 5

## 2022-02-19 MED ORDER — SODIUM CHLORIDE 0.9 % IV SOLN
375.0000 mg/m2 | Freq: Once | INTRAVENOUS | Status: AC
  Administered 2022-02-19: 700 mg via INTRAVENOUS
  Filled 2022-02-19: qty 50

## 2022-02-19 MED ORDER — SODIUM CHLORIDE 0.9 % IV SOLN: Freq: Once | INTRAVENOUS | Status: AC

## 2022-02-19 MED ORDER — SODIUM CHLORIDE 0.9 % IV SOLN
750.0000 mg/m2 | Freq: Once | INTRAVENOUS | Status: AC
  Administered 2022-02-19: 1440 mg via INTRAVENOUS
  Filled 2022-02-19: qty 72

## 2022-02-19 MED ORDER — VINCRISTINE SULFATE CHEMO INJECTION 1 MG/ML
1.0000 mg | Freq: Once | INTRAVENOUS | Status: AC
  Administered 2022-02-19: 1 mg via INTRAVENOUS
  Filled 2022-02-19: qty 1

## 2022-02-19 MED ORDER — FAMOTIDINE IN NACL 20-0.9 MG/50ML-% IV SOLN
20.0000 mg | Freq: Once | INTRAVENOUS | Status: AC
  Administered 2022-02-19: 20 mg via INTRAVENOUS
  Filled 2022-02-19: qty 50

## 2022-02-19 MED ORDER — DIPHENHYDRAMINE HCL 25 MG PO CAPS
50.0000 mg | ORAL_CAPSULE | Freq: Once | ORAL | Status: AC
  Administered 2022-02-19: 50 mg via ORAL
  Filled 2022-02-19: qty 2

## 2022-02-19 MED ORDER — DOXORUBICIN HCL CHEMO IV INJECTION 2 MG/ML
25.0000 mg/m2 | Freq: Once | INTRAVENOUS | Status: AC
  Administered 2022-02-19: 48 mg via INTRAVENOUS
  Filled 2022-02-19: qty 24

## 2022-02-19 MED ORDER — SODIUM CHLORIDE 0.9% FLUSH
10.0000 mL | INTRAVENOUS | Status: DC | PRN
  Administered 2022-02-19: 10 mL

## 2022-02-19 MED ORDER — SODIUM CHLORIDE 0.9 % IV SOLN
10.0000 mg | Freq: Once | INTRAVENOUS | Status: AC
  Administered 2022-02-19: 10 mg via INTRAVENOUS
  Filled 2022-02-19: qty 1

## 2022-02-19 MED ORDER — HEPARIN SOD (PORK) LOCK FLUSH 100 UNIT/ML IV SOLN
500.0000 [IU] | Freq: Once | INTRAVENOUS | Status: AC | PRN
  Administered 2022-02-19: 500 [IU]

## 2022-02-19 MED ORDER — ACETAMINOPHEN 325 MG PO TABS
650.0000 mg | ORAL_TABLET | Freq: Once | ORAL | Status: AC
  Administered 2022-02-19: 650 mg via ORAL
  Filled 2022-02-19: qty 2

## 2022-02-19 NOTE — Patient Instructions (Signed)
[facility] at [facility] Discharge Instructions   You were seen and examined today by Dr. Katragadda.  He reviewed the results of your lab work which are normal/stable.   We will proceed with your last treatment today.   We will repeat a PET scan prior to your next office visit.   Return as scheduled.    Thank you for choosing [facility] at [facility] to provide your oncology and hematology care.  To afford each patient quality time with our provider, please arrive at least 15 minutes before your scheduled appointment time.   If you have a lab appointment with the Cancer Center please come in thru the Main Entrance and check in at the main information desk.  You need to re-schedule your appointment should you arrive 10 or more minutes late.  We strive to give you quality time with our providers, and arriving late affects you and other patients whose appointments are after yours.  Also, if you no show three or more times for appointments you may be dismissed from the clinic at the providers discretion.     Again, thank you for choosing [facility].  Our hope is that these requests will decrease the amount of time that you wait before being seen by our physicians.       _____________________________________________________________  Should you have questions after your visit to [facility], please contact our office at [phone] and follow the prompts.  Our office hours are 8:00 a.m. and 4:30 p.m. Monday - Friday.  Please note that voicemails left after 4:00 p.m. may not be returned until the following business day.  We are closed weekends and major holidays.  You do have access to a nurse 24-7, just call the main number to the clinic [phone] and do not press any options, hold on the line and a nurse will answer the phone.    For prescription refill requests, have your pharmacy contact our office and allow 72  hours.    Due to Covid, you will need to wear a mask upon entering the hospital. If you do not have a mask, a mask will be given to you at the Main Entrance upon arrival. For doctor visits, patients may have 1 support person age 18 or older with them. For treatment visits, patients can not have anyone with them due to social distancing guidelines and our immunocompromised population.      

## 2022-02-19 NOTE — Progress Notes (Signed)
Molly Patel, [place] [id number]   CLINIC:  Medical Oncology/Hematology  PCP:  Renee Rival, FNP [address] [place] / [place] [address] [phone]   REASON FOR VISIT:  Follow-up for large B-cell lymphoma  PRIOR THERAPY: none  NGS Results: not done  CURRENT THERAPY: R-CHOP q21d  BRIEF ONCOLOGIC HISTORY:  Oncology History  Diffuse large B-cell lymphoma ([place])  10/18/2021 Initial Diagnosis   Diffuse large B-cell lymphoma ([place])   10/30/2021 -  Chemotherapy   Patient is on Treatment Plan : NON-HODGKINS LYMPHOMA R-CHOP q21d       CANCER STAGING:  Cancer Staging  Diffuse large B-cell lymphoma ([place]) Staging form: Hodgkin and Non-Hodgkin Lymphoma, AJCC 8th Edition - Clinical stage from 10/22/2021: Stage IV (Diffuse large B-cell lymphoma) - Unsigned   INTERVAL HISTORY:  [name], a 76 y.o. female, returns for routine follow-up and consideration for next cycle of chemotherapy. Molly Patel was last seen on 01/29/2022.  Due for cycle #6 of R-CHOP today.   Overall, she tells me she has been feeling pretty well. Her appetite is good. She denies numbness/tingling, infections, fevers, and n/v/d/c. She reports stable nocturia, and she denies dysuria.   Overall, she feels ready for next cycle of chemo today.    REVIEW OF SYSTEMS:  Review of Systems  Constitutional:  Negative for appetite change, fatigue and fever.  Gastrointestinal:  Negative for constipation, diarrhea, nausea and vomiting.  Genitourinary:  Positive for nocturia. Negative for dysuria.   Neurological:  Negative for numbness.  All other systems reviewed and are negative.   PAST MEDICAL/SURGICAL HISTORY:  Past Medical History:  Diagnosis Date   Angioedema    B-cell lymphoma ([place])    Depression    Dizziness    Echocardiogram with ECG monitoring 02/23/2011   mild LVH, normal event monitor    Hyperlipidemia    Hypertension    takes meds daily    Incontinence    Insomnia    Ovarian cyst    Port-A-Cath in place 10/29/2021   Seizures ([place])    2 weeks ago   Stroke ([place]) 08/25/2014   Syncope    Vitamin D deficiency    Past Surgical History:  Procedure Laterality Date   ABDOMINAL HYSTERECTOMY     COLONOSCOPY N/A 11/02/2014   Procedure: COLONOSCOPY;  Surgeon: Rogene Houston, MD;  [facility];  Service: Endoscopy;  Laterality: N/A;  140 - moved to 12:55 - Ann to notify pt   CRANIOTOMY  06/29/2012   Procedure: CRANIOTOMY TUMOR EXCISION;  Surgeon: Faythe Ghee, MD;  Location: [facility];  Service: Neurosurgery;  Laterality: Left;  Left frontal Craniotomy for Meningioma   ESOPHAGEAL DILATION N/A 11/02/2014   Procedure: ESOPHAGEAL DILATION;  Surgeon: Rogene Houston, MD;  Location: [facility];  Service: Endoscopy;  Laterality: N/A;   ESOPHAGOGASTRODUODENOSCOPY N/A 11/02/2014   Procedure: ESOPHAGOGASTRODUODENOSCOPY (EGD);  Surgeon: Rogene Houston, MD;  Location: [facility];  Service: Endoscopy;  Laterality: N/A;   PORTACATH PLACEMENT Left 10/18/2021   Procedure: INSERTION PORT-A-CATH;  Surgeon: Aviva Signs, MD;  Location: [facility] ORS;  Service: General;  Laterality: Left;   VESICOVAGINAL FISTULA CLOSURE W/ TAH      SOCIAL HISTORY:  Social History   Socioeconomic History   Marital status: Divorced    Spouse name: Not on file   Number of children: 2   Years of education: Not on file   Highest education level:  Not on file  Occupational History   Occupation: Retired    Comment: Tree surgeon  Tobacco Use   Smoking status: Former    Years: 7.00    Types: Cigarettes    Quit date: 08/25/1985    Years since quitting: 36.5   Smokeless tobacco: Never  Vaping Use   Vaping Use: Never used  Substance and Sexual Activity   Alcohol use: No   Drug use: No   Sexual activity: Not on file  Other Topics Concern   Not on file  Social History Narrative   Not on file   Social Determinants of Health   Financial  Resource Strain: Low Risk  (02/17/2022)   Overall Financial Resource Strain (CARDIA)    Difficulty of Paying Living Expenses: Not very hard  Food Insecurity: No Food Insecurity (02/17/2022)   Hunger Vital Sign    Worried About Running Out of Food in the Last Year: Never true    Ran Out of Food in the Last Year: Never true  Transportation Needs: No Transportation Needs (02/17/2022)   PRAPARE - Hydrologist (Medical): No    Lack of Transportation (Non-Medical): No  Physical Activity: Sufficiently Active (02/17/2022)   Exercise Vital Sign    Days of Exercise per Week: 3 days    Minutes of Exercise per Session: 50 min  Stress: Not on file  Social Connections: Not on file  Intimate Partner Violence: Not on file    FAMILY HISTORY:  Family History  Problem Relation Age of Onset   Stroke Father    Asthma Grandchild    Arthritis Sister    Diabetes Sister    Colon cancer Brother     CURRENT MEDICATIONS:  Current Outpatient Medications  Medication Sig Dispense Refill   allopurinol (ZYLOPRIM) 300 MG tablet TAKE 1 TABLET BY MOUTH EVERY DAY 30 tablet 2   aspirin EC 325 MG tablet Take 1 tablet (325 mg total) by mouth daily. 30 tablet 0   atenolol (TENORMIN) 50 MG tablet TAKE 1 TABLET EVERY DAY (Patient taking differently: Take 50 mg by mouth daily.) 90 tablet 2   cholecalciferol (VITAMIN D) 25 MCG (1000 UNIT) tablet TAKE 1 TABLET EVERY DAY (Patient taking differently: 1,000 Units daily.) 90 tablet 3   CYCLOPHOSPHAMIDE IV Inject into the vein every 21 ( twenty-one) days.     donepezil (ARICEPT) 5 MG tablet Take 1 tablet (5 mg total) by mouth at bedtime. 90 tablet 1   DOXOrubicin HCl (ADRIAMYCIN IV) Inject into the vein every 21 ( twenty-one) days.     hydrochlorothiazide (MICROZIDE) 12.5 MG capsule TAKE 1 CAPSULE EVERY DAY 90 capsule 0   HYDROcodone-acetaminophen (NORCO/VICODIN) 5-325 MG tablet Take 1 tablet by mouth every 6 (six) hours as needed for moderate pain or  severe pain. 20 tablet 0   Lactulose 20 GM/30ML SOLN Take 30 mLs (20 g total) by mouth 3 (three) times daily as needed. 450 mL 0   levETIRAcetam (KEPPRA) 500 MG tablet Take 1 tablet (500 mg total) by mouth 2 (two) times daily. Needs to establish with new PCP.  Dr. Buelah Manis is no longer at this office.  Must be seen before more refills. 60 tablet 0   lidocaine-prilocaine (EMLA) cream Apply a small amount to port a cath site and cover with plastic wrap 1 hour prior to infusion appointments 30 g 3   memantine (NAMENDA) 5 MG tablet Take 5 mg by mouth daily.     predniSONE (DELTASONE)  20 MG tablet Take 5 tablets (100 mg) by mouth daily for 5 days starting on the first day of each treatment.  DO NOT take on the weeks you are not receiving chemotherapy treatment. 30 tablet 0   prochlorperazine (COMPAZINE) 10 MG tablet Take 1 tablet (10 mg total) by mouth every 6 (six) hours as needed (Nausea or vomiting). 30 tablet 6   riTUXimab (RITUXAN IV) Inject into the vein every 21 ( twenty-one) days.     rosuvastatin (CRESTOR) 20 MG tablet Take 40m daily 90 tablet 1   traZODone (DESYREL) 50 MG tablet Take 0.5-1 tablets (25-50 mg total) by mouth at bedtime as needed for sleep. 30 tablet 0   VINCRISTINE SULFATE IV Inject into the vein every 21 ( twenty-one) days.     No current facility-administered medications for this visit.    ALLERGIES:  No Known Allergies  PHYSICAL EXAM:  Performance status (ECOG): 2 - Symptomatic, <50% confined to bed  There were no vitals filed for this visit. Wt Readings from Last 3 Encounters:  01/28/22 167 lb (75.8 kg)  01/07/22 169 lb (76.7 kg)  12/11/21 168 lb 4.8 oz (76.3 kg)   Physical Exam Vitals reviewed.  Constitutional:      Appearance: Normal appearance.  Cardiovascular:     Rate and Rhythm: Normal rate and regular rhythm.     Pulses: Normal pulses.     Heart sounds: Normal heart sounds.  Pulmonary:     Effort: Pulmonary effort is normal.     Breath sounds:  Examination of the right-lower field reveals decreased breath sounds. Decreased breath sounds present.  Neurological:     General: No focal deficit present.     Mental Status: She is alert and oriented to person, place, and time.  Psychiatric:        Mood and Affect: Mood normal.        Behavior: Behavior normal.     LABORATORY DATA:  I have reviewed the labs as listed.     Latest Ref Rng & Units 01/29/2022    8:41 AM 01/07/2022    8:08 AM 12/11/2021    8:48 AM  CBC  WBC 4.0 - 10.5 K/uL 3.6  3.7  7.4   Hemoglobin 12.0 - 15.0 g/dL 12.0  11.7  12.1   Hematocrit 36.0 - 46.0 % 36.7  36.2  37.8   Platelets 150 - 400 K/uL 169  155  141       Latest Ref Rng & Units 01/29/2022    8:41 AM 01/07/2022    8:08 AM 12/11/2021    8:48 AM  CMP  Glucose 70 - 99 mg/dL 80  130  98   BUN 8 - 23 mg/dL 16  17  14    Creatinine 0.44 - 1.00 mg/dL 0.68  0.78  0.70   Sodium 135 - 145 mmol/L 138  138  139   Potassium 3.5 - 5.1 mmol/L 3.7  3.3  3.3   Chloride 98 - 111 mmol/L 106  106  103   CO2 22 - 32 mmol/L 28  27  29    Calcium 8.9 - 10.3 mg/dL 8.9  9.0  9.0   Total Protein 6.5 - 8.1 g/dL 6.2  6.4  6.1   Total Bilirubin 0.3 - 1.2 mg/dL 0.7  0.5  0.4   Alkaline Phos 38 - 126 U/L 55  56  65   AST 15 - 41 U/L 14  17  15    ALT 0 -  44 U/L 14  12  12      DIAGNOSTIC IMAGING:  I have independently reviewed the scans and discussed with the patient. No results found.   ASSESSMENT:  Stage IIIb DLBCL: - Weakness, 12 pound weight loss over the last 2 months - CT renal study on 09/24/2021: Right retroperitoneal nodal mass measuring 4.4 x 5.7 x 7.1 cm.  Enlarged lymph node mass in the central small bowel mesentery measuring 2.1 x 6.3 x 7.1 cm.  Right pelvic sidewall lymph node borderline enlarged measuring 1 cm.  Indeterminate ovoid mass in the left adnexal region measuring 3.7 x 2.1 cm. - CT chest angiogram on 09/25/2021 negative for PE.  Large right pleural effusion with partial collapse of the right lung.   Partially seen large right retroperitoneal soft tissue mass measuring 4.4 x 5.8 x 7 cm extends superiorly along the right para-aortic region through the diaphragmatic hiatus into the thorax and pleural space.  Severe soft tissue pleural thickening along the posterior aspect of the right pleural space measuring 2.8 cm in maximal thickness.  4.2 x 3.1 cm right pleural-based mass along the anteromedial aspect abutting the pericardium. - Right pleural fluid flow cytometry-monoclonal B-cell population with coexpression of CD10 comprising 69% of all lymphocytes. - Right retroperitoneal mass needle biopsy on 09/26/2021 consistent with large B-cell type (GCB subtype) is favored.  Large lymphoid cells are positive for CD79a, CD20, CD10, BCL6 and Bcl-2.  Ki-67 up to 50% in some areas. - R mini CHOP cycle 1 on 10/31/2021.    Social/family history: - She [facility]. - She is currently living with her sister and niece.  Niece takes care of her.  She is a retired Occupational psychologist.  No exposure to chemicals.  Non-smoker. - Brother had throat cancer.  Another brother had liver cancer.   PLAN:  Stage IVb diffuse large B-cell lymphoma: - PET scan after cycle 3 showed marked interval response to therapy, [place] 2. - Cycle 5 chemotherapy was fairly well-tolerated. - Reviewed labs today which showed normal LFTs.  CBC shows leukopenia with ANC normal for treatment. - Recommend proceeding with cycle 6 today.  I plan to see her back in 1 month with repeat PET scan and labs. - I will also consider repeating MRI of the thoracic spine in the next few months.  2.  Right pleural effusion: - Last thoracentesis on 11/08/2021.  No worsening shortness of breath reported.  3.  Right epidural tumor at T9-10: - MRI T-spine on 12/27/2021: Persistent but decreased right paraspinal enhancing tissue compared to 11/06/2021.  4.  TLS prophylaxis: -  Continue allopurinol daily.  Will check uric acid at next time and discontinue it.  5.  Hypertension: - Continue atenolol 50 mg daily.  Continue to hold HCTZ.  Blood pressure today is 140/70.   Orders placed this encounter:  No orders of the defined types were placed in this encounter.    Derek Jack, MD [place] [phone]   I, Thana Ates, am acting as a scribe for Dr. Derek Jack.  I, Derek Jack MD, have reviewed the above documentation for accuracy and completeness, and I agree with the above.

## 2022-02-19 NOTE — Patient Instructions (Signed)
Molly Patel  Discharge Instructions: Thank you for choosing [facility] to provide your oncology and hematology care.  If you have a lab appointment with the [facility], please come in thru the Main Entrance and check in at the main information desk.  Wear comfortable clothing and clothing appropriate for easy access to any Portacath or PICC line.   We strive to give you quality time with your provider. You may need to reschedule your appointment if you arrive late (15 or more minutes).  Arriving late affects you and other patients whose appointments are after yours.  Also, if you miss three or more appointments without notifying the office, you may be dismissed from the clinic at the provider's discretion.      For prescription refill requests, have your pharmacy contact our office and allow 72 hours for refills to be completed.    Today you received the following chemotherapy and/or immunotherapy agents RCHOP.  Rituximab Injection What is this medication? RITUXIMAB (ri TUX i mab) is a monoclonal antibody. It is used to treat certain types of cancer like non-Hodgkin lymphoma and chronic lymphocytic leukemia. It is also used to treat rheumatoid arthritis, granulomatosis with polyangiitis, microscopic polyangiitis, and pemphigus vulgaris. This medicine may be used for other purposes; ask your health care provider or pharmacist if you have questions. COMMON BRAND NAME(S): RIABNI, Rituxan, RUXIENCE, truxima What should I tell my care team before I take this medication? They need to know if you have any of these conditions: chest pain heart disease infection especially a viral infection such as chickenpox, cold sores, hepatitis B, or herpes immune system problems irregular heartbeat or rhythm kidney disease low blood counts (white cells, platelets, or red cells) lung disease recent or upcoming vaccine an unusual or allergic reaction to rituximab, other medicines,  foods, dyes, or preservatives pregnant or trying to get pregnant breast-feeding How should I use this medication? This medicine is injected into a vein. It is given by a health care provider in a hospital or clinic setting. A special MedGuide will be given to you before each treatment. Be sure to read this information carefully each time. Talk to your health care provider about the use of this medicine in children. While this drug may be prescribed for children as young as 6 months for selected conditions, precautions do apply. Overdosage: If you think you have taken too much of this medicine contact a poison control center or emergency room at once. NOTE: This medicine is only for you. Do not share this medicine with others. What if I miss a dose? Keep appointments for follow-up doses. It is important not to miss your dose. Call your health care provider if you are unable to keep an appointment. What may interact with this medication? Do not take this medicine with any of the following medicines: live vaccines This medicine may also interact with the following medicines: cisplatin This list may not describe all possible interactions. Give your health care provider a list of all the medicines, herbs, non-prescription drugs, or dietary supplements you use. Also tell them if you smoke, drink alcohol, or use illegal drugs. Some items may interact with your medicine. What should I watch for while using this medication? Your condition will be monitored carefully while you are receiving this medicine. You may need blood work done while you are taking this medicine. This medicine can cause serious infusion reactions. To reduce the risk your health care provider may give you other  medicines to take before receiving this one. Be sure to follow the directions from your health care provider. This medicine may increase your risk of getting an infection. Call your health care provider for advice if you get a  fever, chills, sore throat, or other symptoms of a cold or flu. Do not treat yourself. Try to avoid being around people who are sick. Call your health care provider if you are around anyone with measles, chickenpox, or if you develop sores or blisters that do not heal properly. Avoid taking medicines that contain aspirin, acetaminophen, ibuprofen, naproxen, or ketoprofen unless instructed by your health care provider. These medicines may hide a fever. This medicine may cause serious skin reactions. They can happen weeks to months after starting the medicine. Contact your health care provider right away if you notice fevers or flu-like symptoms with a rash. The rash may be red or purple and then turn into blisters or peeling of the skin. Or, you might notice a red rash with swelling of the face, lips or lymph nodes in your neck or under your arms. In some patients, this medicine may cause a serious brain infection that may cause death. If you have any problems seeing, thinking, speaking, walking, or standing, tell your healthcare professional right away. If you cannot reach your healthcare professional, urgently seek other source of medical care. Do not become pregnant while taking this medicine or for at least 12 months after stopping it. Women should inform their health care provider if they wish to become pregnant or think they might be pregnant. There is potential for serious harm to an unborn child. Talk to your health care provider for more information. Women should use a reliable form of birth control while taking this medicine and for 12 months after stopping it. Do not breast-feed while taking this medicine or for at least 6 months after stopping it. What side effects may I notice from receiving this medication? Side effects that you should report to your health care provider as soon as possible: allergic reactions (skin rash, itching or hives; swelling of the face, lips, or tongue) diarrhea edema  (sudden weight gain; swelling of the ankles, feet, hands or other unusual swelling; trouble breathing) fast, irregular heartbeat heart attack (trouble breathing; pain or tightness in the chest, neck, back or arms; unusually weak or tired) infection (fever, chills, cough, sore throat, pain or trouble passing urine) kidney injury (trouble passing urine or change in the amount of urine) liver injury (dark yellow or brown urine; general ill feeling or flu-like symptoms; loss of appetite, right upper belly pain; unusually weak or tired, yellowing of the eyes or skin) low blood pressure (dizziness; feeling faint or lightheaded, falls; unusually weak or tired) low red blood cell counts (trouble breathing; feeling faint; lightheaded, falls; unusually weak or tired) mouth sores redness, blistering, peeling, or loosening of the skin, including inside the mouth stomach pain unusual bruising or bleeding wheezing (trouble breathing with loud or whistling sounds) vomiting Side effects that usually do not require medical attention (report to your health care provider if they continue or are bothersome): headache joint pain muscle cramps, pain nausea This list may not describe all possible side effects. Call your doctor for medical advice about side effects. You may report side effects to FDA at 1-800-FDA-1088. Where should I keep my medication? This medicine is given in a hospital or clinic. It will not be stored at home. NOTE: This sheet is a summary. It may not cover  all possible information. If you have questions about this medicine, talk to your doctor, pharmacist, or health care provider.  2023 Elsevier/Gold Standard (2020-08-13 00:00:00) Cyclophosphamide Injection What is this medication? CYCLOPHOSPHAMIDE (sye kloe FOSS fa mide) is a chemotherapy drug. It slows the growth of cancer cells. This medicine is used to treat many types of cancer like lymphoma, myeloma, leukemia, breast cancer, and  ovarian cancer, to name a few. This medicine may be used for other purposes; ask your health care provider or pharmacist if you have questions. COMMON BRAND NAME(S): Cyclophosphamide, Cytoxan, Neosar What should I tell my care team before I take this medication? They need to know if you have any of these conditions: heart disease history of irregular heartbeat infection kidney disease liver disease low blood counts, like white cells, platelets, or red blood cells on hemodialysis recent or ongoing radiation therapy scarring or thickening of the lungs trouble passing urine an unusual or allergic reaction to cyclophosphamide, other medicines, foods, dyes, or preservatives pregnant or trying to get pregnant breast-feeding How should I use this medication? This drug is usually given as an injection into a vein or muscle or by infusion into a vein. It is administered in a hospital or clinic by a specially trained health care professional. Talk to your pediatrician regarding the use of this medicine in children. Special care may be needed. Overdosage: If you think you have taken too much of this medicine contact a poison control center or emergency room at once. NOTE: This medicine is only for you. Do not share this medicine with others. What if I miss a dose? It is important not to miss your dose. Call your doctor or health care professional if you are unable to keep an appointment. What may interact with this medication? amphotericin B azathioprine certain antivirals for HIV or hepatitis certain medicines for blood pressure, heart disease, irregular heart beat certain medicines that treat or prevent blood clots like warfarin certain other medicines for cancer cyclosporine etanercept indomethacin medicines that relax muscles for surgery medicines to increase blood counts metronidazole This list may not describe all possible interactions. Give your health care provider a list of all  the medicines, herbs, non-prescription drugs, or dietary supplements you use. Also tell them if you smoke, drink alcohol, or use illegal drugs. Some items may interact with your medicine. What should I watch for while using this medication? Your condition will be monitored carefully while you are receiving this medicine. You may need blood work done while you are taking this medicine. Drink water or other fluids as directed. Urinate often, even at night. Some products may contain alcohol. Ask your health care professional if this medicine contains alcohol. Be sure to tell all health care professionals you are taking this medicine. Certain medicines, like metronidazole and disulfiram, can cause an unpleasant reaction when taken with alcohol. The reaction includes flushing, headache, nausea, vomiting, sweating, and increased thirst. The reaction can last from 30 minutes to several hours. Do not become pregnant while taking this medicine or for 1 year after stopping it. Women should inform their health care professional if they wish to become pregnant or think they might be pregnant. Men should not father a child while taking this medicine and for 4 months after stopping it. There is potential for serious side effects to an unborn child. Talk to your health care professional for more information. Do not breast-feed an infant while taking this medicine or for 1 week after stopping it. This medicine  has caused ovarian failure in some women. This medicine may make it more difficult to get pregnant. Talk to your health care professional if you are concerned about your fertility. This medicine has caused decreased sperm counts in some men. This may make it more difficult to father a child. Talk to your health care professional if you are concerned about your fertility. Call your health care professional for advice if you get a fever, chills, or sore throat, or other symptoms of a cold or flu. Do not treat  yourself. This medicine decreases your body's ability to fight infections. Try to avoid being around people who are sick. Avoid taking medicines that contain aspirin, acetaminophen, ibuprofen, naproxen, or ketoprofen unless instructed by your health care professional. These medicines may hide a fever. Talk to your health care professional about your risk of cancer. You may be more at risk for certain types of cancer if you take this medicine. If you are going to need surgery or other procedure, tell your health care professional that you are using this medicine. Be careful brushing or flossing your teeth or using a toothpick because you may get an infection or bleed more easily. If you have any dental work done, tell your dentist you are receiving this medicine. What side effects may I notice from receiving this medication? Side effects that you should report to your doctor or health care professional as soon as possible: allergic reactions like skin rash, itching or hives, swelling of the face, lips, or tongue breathing problems nausea, vomiting signs and symptoms of bleeding such as bloody or black, tarry stools; red or dark brown urine; spitting up blood or brown material that looks like coffee grounds; red spots on the skin; unusual bruising or bleeding from the eyes, gums, or nose signs and symptoms of heart failure like fast, irregular heartbeat, sudden weight gain; swelling of the ankles, feet, hands signs and symptoms of infection like fever; chills; cough; sore throat; pain or trouble passing urine signs and symptoms of kidney injury like trouble passing urine or change in the amount of urine signs and symptoms of liver injury like dark yellow or brown urine; general ill feeling or flu-like symptoms; light-colored stools; loss of appetite; nausea; right upper belly pain; unusually weak or tired; yellowing of the eyes or skin Side effects that usually do not require medical attention (report  to your doctor or health care professional if they continue or are bothersome): confusion decreased hearing diarrhea facial flushing hair loss headache loss of appetite missed menstrual periods signs and symptoms of low red blood cells or anemia such as unusually weak or tired; feeling faint or lightheaded; falls skin discoloration This list may not describe all possible side effects. Call your doctor for medical advice about side effects. You may report side effects to FDA at 1-800-FDA-1088. Where should I keep my medication? This drug is given in a hospital or clinic and will not be stored at home. NOTE: This sheet is a summary. It may not cover all possible information. If you have questions about this medicine, talk to your doctor, pharmacist, or health care provider.  2023 Elsevier/Gold Standard (2021-07-12 00:00:00) Vincristine injection What is this medication? VINCRISTINE (vin KRIS teen) is a chemotherapy drug. It slows the growth of cancer cells. This medicine is used to treat many types of cancer like Hodgkin's disease, leukemia, non-Hodgkin's lymphoma, neuroblastoma (brain cancer), rhabdomyosarcoma, and Wilms' tumor. This medicine may be used for other purposes; ask your health care provider  or pharmacist if you have questions. COMMON BRAND NAME(S): Oncovin, Vincasar PFS What should I tell my care team before I take this medication? They need to know if you have any of these conditions: blood disorders gout infection (especially chickenpox, cold sores, or herpes) kidney disease liver disease lung disease nervous system disease like Charcot-Marie-Tooth (CMT) recent or ongoing radiation therapy an unusual or allergic reaction to vincristine, other chemotherapy agents, other medicines, foods, dyes, or preservatives pregnant or trying to get pregnant breast-feeding How should I use this medication? This drug is given as an infusion into a vein. It is administered in a  hospital or clinic by a specially trained health care professional. If you have pain, swelling, burning, or any unusual feeling around the site of your injection, tell your health care professional right away. Talk to your pediatrician regarding the use of this medicine in children. While this drug may be prescribed for selected conditions, precautions do apply. Overdosage: If you think you have taken too much of this medicine contact a poison control center or emergency room at once. NOTE: This medicine is only for you. Do not share this medicine with others. What if I miss a dose? It is important not to miss your dose. Call your doctor or health care professional if you are unable to keep an appointment. What may interact with this medication? certain medicines for fungal infections like itraconazole, ketoconazole, posaconazole, voriconazole certain medicines for seizures like phenytoin This list may not describe all possible interactions. Give your health care provider a list of all the medicines, herbs, non-prescription drugs, or dietary supplements you use. Also tell them if you smoke, drink alcohol, or use illegal drugs. Some items may interact with your medicine. What should I watch for while using this medication? This drug may make you feel generally unwell. This is not uncommon, as chemotherapy can affect healthy cells as well as cancer cells. Report any side effects. Continue your course of treatment even though you feel ill unless your doctor tells you to stop. You may need blood work done while you are taking this medicine. This medicine will cause constipation. Try to have a bowel movement at least every 2 to 3 days. If you do not have a bowel movement for 3 days, call your doctor or health care professional. In some cases, you may be given additional medicines to help with side effects. Follow all directions for their use. Do not become pregnant while taking this medicine. Women should  inform their doctor if they wish to become pregnant or think they might be pregnant. There is a potential for serious side effects to an unborn child. Talk to your health care professional or pharmacist for more information. Do not breast-feed an infant while taking this medicine. This medicine may make it more difficult to get pregnant or to father a child. Talk to your healthcare professional if you are concerned about your fertility. What side effects may I notice from receiving this medication? Side effects that you should report to your doctor or health care professional as soon as possible: allergic reactions like skin rash, itching or hives, swelling of the face, lips, or tongue breathing problems confusion or changes in emotions or moods constipation cough mouth sores muscle weakness nausea and vomiting pain, swelling, redness or irritation at the injection site pain, tingling, numbness in the hands or feet problems with balance, talking, walking seizures stomach pain trouble passing urine or change in the amount of urine Side effects  that usually do not require medical attention (report to your doctor or health care professional if they continue or are bothersome): diarrhea hair loss jaw pain loss of appetite This list may not describe all possible side effects. Call your doctor for medical advice about side effects. You may report side effects to FDA at 1-800-FDA-1088. Where should I keep my medication? This drug is given in a hospital or clinic and will not be stored at home. NOTE: This sheet is a summary. It may not cover all possible information. If you have questions about this medicine, talk to your doctor, pharmacist, or health care provider.  2023 Elsevier/Gold Standard (2021-07-12 00:00:00)          To help prevent nausea and vomiting after your treatment, we encourage you to take your nausea medication as directed.  BELOW ARE SYMPTOMS THAT SHOULD BE REPORTED  IMMEDIATELY: *FEVER GREATER THAN 100.4 F (38 C) OR HIGHER *CHILLS OR SWEATING *NAUSEA AND VOMITING THAT IS NOT CONTROLLED WITH YOUR NAUSEA MEDICATION *UNUSUAL SHORTNESS OF BREATH *UNUSUAL BRUISING OR BLEEDING *URINARY PROBLEMS (pain or burning when urinating, or frequent urination) *BOWEL PROBLEMS (unusual diarrhea, constipation, pain near the anus) TENDERNESS IN MOUTH AND THROAT WITH OR WITHOUT PRESENCE OF ULCERS (sore throat, sores in mouth, or a toothache) UNUSUAL RASH, SWELLING OR PAIN  UNUSUAL VAGINAL DISCHARGE OR ITCHING   Items with * indicate a potential emergency and should be followed up as soon as possible or go to the Emergency Department if any problems should occur.  Please show the CHEMOTHERAPY ALERT CARD or IMMUNOTHERAPY ALERT CARD at check-in to the Emergency Department and triage nurse.  Should you have questions after your visit or need to cancel or reschedule your appointment, please contact [facility] [phone]  and follow the prompts.  Office hours are 8:00 a.m. to 4:30 p.m. Monday - Friday. Please note that voicemails left after 4:00 p.m. may not be returned until the following business day.  We are closed weekends and major holidays. You have access to a nurse at all times for urgent questions. Please call the main number to the clinic [phone] and follow the prompts.  For any non-urgent questions, you may also contact your provider using MyChart. We now offer e-Visits for anyone 47 and older to request care online for non-urgent symptoms. For details visit [email or web address].   Also download the MyChart app! Go to the app store, search "MyChart", open the app, select [facility], and log in with your MyChart username and password.  Masks are optional in the cancer centers. If you would like for your care team to wear a mask while they are taking care of you, please let them know. For doctor visits, patients may have with them one support person  who is at least 78 years old. At this time, visitors are not allowed in the infusion area.

## 2022-02-19 NOTE — Progress Notes (Signed)
Patient presents today for treatment and follow up visit with Dr. Delton Coombes. Labs within parameters for treatment. Vital signs within parameters for treatment.   Message received from A. Ouida Sills RN / Dr. Delton Coombes to proceed with treatment.   Treatment given today per MD orders. Tolerated infusion without adverse affects. Vital signs stable. No complaints at this time. Discharged from clinic ambulatory in stable condition. Alert and oriented x 3. F/U with [facility] as scheduled.

## 2022-02-21 ENCOUNTER — Inpatient Hospital Stay (HOSPITAL_COMMUNITY): Payer: Medicare PPO

## 2022-02-21 ENCOUNTER — Encounter (HOSPITAL_COMMUNITY): Payer: Self-pay

## 2022-02-21 VITALS — BP 176/74 | HR 55 | Temp 97.2°F | Resp 18

## 2022-02-21 DIAGNOSIS — Z87891 Personal history of nicotine dependence: Secondary | ICD-10-CM | POA: Diagnosis not present

## 2022-02-21 DIAGNOSIS — Z7982 Long term (current) use of aspirin: Secondary | ICD-10-CM | POA: Diagnosis not present

## 2022-02-21 DIAGNOSIS — Z5111 Encounter for antineoplastic chemotherapy: Secondary | ICD-10-CM | POA: Diagnosis not present

## 2022-02-21 DIAGNOSIS — Z5189 Encounter for other specified aftercare: Secondary | ICD-10-CM | POA: Diagnosis not present

## 2022-02-21 DIAGNOSIS — Z5112 Encounter for antineoplastic immunotherapy: Secondary | ICD-10-CM | POA: Diagnosis not present

## 2022-02-21 DIAGNOSIS — C8330 Diffuse large B-cell lymphoma, unspecified site: Secondary | ICD-10-CM | POA: Diagnosis not present

## 2022-02-21 DIAGNOSIS — I10 Essential (primary) hypertension: Secondary | ICD-10-CM | POA: Diagnosis not present

## 2022-02-21 DIAGNOSIS — Z79899 Other long term (current) drug therapy: Secondary | ICD-10-CM | POA: Diagnosis not present

## 2022-02-21 DIAGNOSIS — Z95828 Presence of other vascular implants and grafts: Secondary | ICD-10-CM

## 2022-02-21 DIAGNOSIS — C8338 Diffuse large B-cell lymphoma, lymph nodes of multiple sites: Secondary | ICD-10-CM

## 2022-02-21 DIAGNOSIS — Z7952 Long term (current) use of systemic steroids: Secondary | ICD-10-CM | POA: Diagnosis not present

## 2022-02-21 MED ORDER — PEGFILGRASTIM-CBQV 6 MG/0.6ML ~~LOC~~ SOSY
6.0000 mg | PREFILLED_SYRINGE | Freq: Once | SUBCUTANEOUS | Status: AC
  Administered 2022-02-21: 6 mg via SUBCUTANEOUS
  Filled 2022-02-21: qty 0.6

## 2022-02-21 NOTE — Progress Notes (Signed)
Patient tolerated injection with no complaints voiced.  Site clean and dry with no bruising or swelling noted at site.  See MAR for details.  Band aid applied.  Patient stable during and after injection.  Vss with discharge and left in satisfactory condition with no s/s of distress noted.  

## 2022-02-21 NOTE — Patient Instructions (Signed)
[facility]  Discharge Instructions: Thank you for choosing [facility] to provide your oncology and hematology care.  If you have a lab appointment with the Cancer Center, please come in thru the Main Entrance and check in at the main information desk.  Wear comfortable clothing and clothing appropriate for easy access to any Portacath or PICC line.   We strive to give you quality time with your provider. You may need to reschedule your appointment if you arrive late (15 or more minutes).  Arriving late affects you and other patients whose appointments are after yours.  Also, if you miss three or more appointments without notifying the office, you may be dismissed from the clinic at the provider's discretion.      For prescription refill requests, have your pharmacy contact our office and allow 72 hours for refills to be completed.    Today you received the following chemotherapy and/or immunotherapy agents udenyca.  Pegfilgrastim Injection What is this medication? PEGFILGRASTIM (PEG fil gra stim) lowers the risk of infection in people who are receiving chemotherapy. It works by helping your body make more white blood cells, which protects your body from infection. It may also be used to help people who have been exposed to high doses of radiation. This medicine may be used for other purposes; ask your health care provider or pharmacist if you have questions. COMMON BRAND NAME(S): Fulphila, Fylnetra, Neulasta, Nyvepria, Stimufend, UDENYCA, Ziextenzo What should I tell my care team before I take this medication? They need to know if you have any of these conditions: Kidney disease Latex allergy Ongoing radiation therapy Sickle cell disease Skin reactions to acrylic adhesives (On-Body Injector only) An unusual or allergic reaction to pegfilgrastim, filgrastim, other medications, foods, dyes, or preservatives Pregnant or trying to get pregnant Breast-feeding How  should I use this medication? This medication is for injection under the skin. If you get this medication at home, you will be taught how to prepare and give the pre-filled syringe or how to use the On-body Injector. Refer to the patient Instructions for Use for detailed instructions. Use exactly as directed. Tell your care team immediately if you suspect that the On-body Injector may not have performed as intended or if you suspect the use of the On-body Injector resulted in a missed or partial dose. It is important that you put your used needles and syringes in a special sharps container. Do not put them in a trash can. If you do not have a sharps container, call your pharmacist or care team to get one. Talk to your care team about the use of this medication in children. While this medication may be prescribed for selected conditions, precautions do apply. Overdosage: If you think you have taken too much of this medicine contact a poison control center or emergency room at once. NOTE: This medicine is only for you. Do not share this medicine with others. What if I miss a dose? It is important not to miss your dose. Call your care team if you miss your dose. If you miss a dose due to an On-body Injector failure or leakage, a new dose should be administered as soon as possible using a single prefilled syringe for manual use. What may interact with this medication? Interactions have not been studied. This list may not describe all possible interactions. Give your health care provider a list of all the medicines, herbs, non-prescription drugs, or dietary supplements you use. Also tell them   if you smoke, drink alcohol, or use illegal drugs. Some items may interact with your medicine. What should I watch for while using this medication? Your condition will be monitored carefully while you are receiving this medication. You may need blood work done while you are taking this medication. Talk to your care  team about your risk of cancer. You may be more at risk for certain types of cancer if you take this medication. If you are going to need a MRI, CT scan, or other procedure, tell your care team that you are using this medication (On-Body Injector only). What side effects may I notice from receiving this medication? Side effects that you should report to your care team as soon as possible: Allergic reactions--skin rash, itching, hives, swelling of the face, lips, tongue, or throat Capillary leak syndrome--stomach or muscle pain, unusual weakness or fatigue, feeling faint or lightheaded, decrease in the amount of urine, swelling of the ankles, hands, or feet, trouble breathing High white blood cell level--fever, fatigue, trouble breathing, night sweats, change in vision, weight loss Inflammation of the aorta--fever, fatigue, back, chest, or stomach pain, severe headache Kidney injury (glomerulonephritis)--decrease in the amount of urine, red or dark brown urine, foamy or bubbly urine, swelling of the ankles, hands, or feet Shortness of breath or trouble breathing Spleen injury--pain in upper left stomach or shoulder Unusual bruising or bleeding Side effects that usually do not require medical attention (report to your care team if they continue or are bothersome): Bone pain Pain in the hands or feet This list may not describe all possible side effects. Call your doctor for medical advice about side effects. You may report side effects to FDA at 1-800-FDA-1088. Where should I keep my medication? Keep out of the reach of children. If you are using this medication at home, you will be instructed on how to store it. Throw away any unused medication after the expiration date on the label. NOTE: This sheet is a summary. It may not cover all possible information. If you have questions about this medicine, talk to your doctor, pharmacist, or health care provider.  2023 Elsevier/Gold Standard (2021-07-12  00:00:00)       To help prevent nausea and vomiting after your treatment, we encourage you to take your nausea medication as directed.  BELOW ARE SYMPTOMS THAT SHOULD BE REPORTED IMMEDIATELY: *FEVER GREATER THAN 100.4 F (38 C) OR HIGHER *CHILLS OR SWEATING *NAUSEA AND VOMITING THAT IS NOT CONTROLLED WITH YOUR NAUSEA MEDICATION *UNUSUAL SHORTNESS OF BREATH *UNUSUAL BRUISING OR BLEEDING *URINARY PROBLEMS (pain or burning when urinating, or frequent urination) *BOWEL PROBLEMS (unusual diarrhea, constipation, pain near the anus) TENDERNESS IN MOUTH AND THROAT WITH OR WITHOUT PRESENCE OF ULCERS (sore throat, sores in mouth, or a toothache) UNUSUAL RASH, SWELLING OR PAIN  UNUSUAL VAGINAL DISCHARGE OR ITCHING   Items with * indicate a potential emergency and should be followed up as soon as possible or go to the Emergency Department if any problems should occur.  Please show the CHEMOTHERAPY ALERT CARD or IMMUNOTHERAPY ALERT CARD at check-in to the Emergency Department and triage nurse.  Should you have questions after your visit or need to cancel or reschedule your appointment, please contact [facility] [phone]  and follow the prompts.  Office hours are 8:00 a.m. to 4:30 p.m. Monday - Friday. Please note that voicemails left after 4:00 p.m. may not be returned until the following business day.  We are closed weekends and major holidays. You have   access to a nurse at all times for urgent questions. Please call the main number to the clinic [phone] and follow the prompts.  For any non-urgent questions, you may also contact your provider using MyChart. We now offer e-Visits for anyone 18 and older to request care online for non-urgent symptoms. For details visit [email or web address].   Also download the MyChart app! Go to the app store, search "MyChart", open the app, select [facility], and log in with your MyChart username and password.  Masks are optional in the cancer  centers. If you would like for your care team to wear a mask while they are taking care of you, please let them know. For doctor visits, patients may have with them one support person who is at least 78 years old. At this time, visitors are not allowed in the infusion area.  

## 2022-03-05 ENCOUNTER — Telehealth (HOSPITAL_COMMUNITY): Payer: Self-pay | Admitting: *Deleted

## 2022-03-05 NOTE — Telephone Encounter (Signed)
Phoned pt to inquire as to why she canceled her PET scan. She reports that, she "just doesn't like it" and does not want to do it. She reports that she just feels like she is "dragging" and feels she has missed out on a lot of the summer due to feeling fatigued. She states there is no need for her to get the PET scan done because no matter what it shows, she would not be pursuing any further treatment for her lymphoma. She is adamant that she will not get any chemotherapy in the future. Pt is agreeable to keep her appt with Dr. Delton Coombes as scheduled on 03/26/22. Instructed pt to call me if she should have any questions or concerns prior to that time. She agrees she will if needed.

## 2022-03-17 ENCOUNTER — Other Ambulatory Visit: Payer: Self-pay

## 2022-03-20 ENCOUNTER — Other Ambulatory Visit (HOSPITAL_COMMUNITY): Payer: Medicare PPO

## 2022-03-26 ENCOUNTER — Ambulatory Visit (HOSPITAL_COMMUNITY): Payer: Medicare PPO | Admitting: Hematology

## 2022-03-27 NOTE — Telephone Encounter (Signed)
Automated system called to remind of appt

## 2022-03-27 NOTE — Telephone Encounter (Signed)
Patient returning office again.

## 2022-04-01 ENCOUNTER — Ambulatory Visit (INDEPENDENT_AMBULATORY_CARE_PROVIDER_SITE_OTHER): Payer: Self-pay | Admitting: Nurse Practitioner

## 2022-04-01 ENCOUNTER — Encounter: Payer: Self-pay | Admitting: Nurse Practitioner

## 2022-04-01 VITALS — BP 160/78 | HR 61 | Ht 66.0 in | Wt 169.0 lb

## 2022-04-01 DIAGNOSIS — I10 Essential (primary) hypertension: Secondary | ICD-10-CM | POA: Diagnosis not present

## 2022-04-01 DIAGNOSIS — C8338 Diffuse large B-cell lymphoma, lymph nodes of multiple sites: Secondary | ICD-10-CM

## 2022-04-01 DIAGNOSIS — E7800 Pure hypercholesterolemia, unspecified: Secondary | ICD-10-CM | POA: Diagnosis not present

## 2022-04-01 MED ORDER — HYDROCHLOROTHIAZIDE 12.5 MG PO CAPS: 12.5000 mg | ORAL_CAPSULE | Freq: Every day | ORAL | 0 refills | Status: DC

## 2022-04-01 MED ORDER — HYDROCHLOROTHIAZIDE 12.5 MG PO CAPS
12.5000 mg | ORAL_CAPSULE | Freq: Every day | ORAL | 0 refills | Status: DC
Start: 2022-04-01 — End: 2022-04-01

## 2022-04-01 NOTE — Assessment & Plan Note (Signed)
BP Readings from Last 3 Encounters:  04/01/22 (!) 160/78  02/21/22 (!) 176/74  02/19/22 (!) 149/72  uncontrolled condition Currently on atenolol50 mg daily  Start hydrochlorothiazide 12.'5mg'$  daily , continue atenolol '50mg'$  daily DASH diet advised  Engage in regular daily exercises at least 150 minutes weekly as tolerated  BMP in 2 weeks  follow up in 4 weeks

## 2022-04-01 NOTE — Patient Instructions (Addendum)
Please start taking hydrochlorothiazide 12.'5mg'$  daily , monitor your blood pressure daily at home , keep a log and bring to next visit in 4 weeks , blood pressure goal is less than 140/90.   Please consider getting your shingles vaccine at the pharmacy.   It is important that you exercise regularly at least 30 minutes 5 times a week as tolerated  Think about what you will eat, plan ahead. Choose " clean, green, fresh or frozen" over canned, processed or packaged foods which are more sugary, salty and fatty. 70 to 75% of food eaten should be vegetables and fruit. Three meals at set times with snacks allowed between meals, but they must be fruit or vegetables. Aim to eat over a 12 hour period , example 7 am to 7 pm, and STOP after  your last meal of the day. Drink water,generally about 64 ounces per day, no other drink is as healthy. Fruit juice is best enjoyed in a healthy way, by EATING the fruit.  Thanks for choosing [facility], we consider it a privelige to serve you.

## 2022-04-01 NOTE — Assessment & Plan Note (Signed)
Stated that she has completed chemotherapy , does not want to do PET scan , not willing to do treatment again because it was hard on her body . Patient was encouraged to discuss her concerns with hematology, states that her children are aware  of her decision

## 2022-04-01 NOTE — Assessment & Plan Note (Signed)
Lab Results  Component Value Date   CHOL 219 (H) 01/29/2022   HDL 66 01/29/2022   LDLCALC 126 (H) 01/29/2022   TRIG 136 01/29/2022   CHOLHDL 3.3 01/29/2022  on crestor '20mg'$  daily  LDL goal is less than 100 Check lipid panel today  Avoid fried fatty foods

## 2022-04-01 NOTE — Progress Notes (Signed)
   Molly Patel     MRN: [id number]      DOB: 05/03/1946   HPI Molly Patel with past medical history of hypertension, CVA, lymphoma, cognitive changes is here for follow up for hyperlipidemia.  Taking rosuvastatin 20 mg daily,denies muscle aches.  States that she has completed treatment for lymphoma, she was advised to do a PET scan but she refused saying that she would not want to do further treatment and with if her PET scan comes back abnormal, states that she feels very much better but still has weakness from her chemotherapy.   The PT denies any adverse reactions to current medications since the last visit.   Due for shingles vaccine, pt encouraged to get the vaccine the vaccine at her pharmacy     ROS Denies recent fever or chills. Denies sinus pressure, nasal congestion, ear pain or sore throat. Denies chest congestion, productive cough or wheezing. Denies chest pains, palpitations and leg swelling Denies abdominal pain, nausea, vomiting,diarrhea or constipation.   Denies joint pain, swelling and limitation in mobility. Denies depression, anxiety or insomnia. Denies skin break down or rash.   PE  BP (!) 158/90 (BP Location: Left Arm, Cuff Size: Normal)   Pulse 61   Ht '5\' 6"'$  (1.676 m)   Wt 169 lb (76.7 kg)   SpO2 94%   BMI 27.28 kg/m   Patient alert and oriented and in no cardiopulmonary distress.  Chest: Clear to auscultation bilaterally.  CVS: S1, S2 no murmurs, no S3.Regular rate.  ABD: Soft non tender.   Ext: No edema  MS: Adequate ROM spine, shoulders, hips and knees.  Psych: Good eye contact, normal affect. Memory intact not anxious or depressed appearing.  CNS: CN 2-12 intact, power,  normal throughout.no focal deficits noted.   Assessment & Plan  Hypertension BP Readings from Last 3 Encounters:  04/01/22 (!) 160/78  02/21/22 (!) 176/74  02/19/22 (!) 149/72  uncontrolled condition Currently on atenolol50 mg daily  Start hydrochlorothiazide  12.'5mg'$  daily , continue atenolol '50mg'$  daily DASH diet advised  Engage in regular daily exercises at least 150 minutes weekly as tolerated  BMP in 2 weeks  follow up in 4 weeks   Hyperlipidemia Lab Results  Component Value Date   CHOL 219 (H) 01/29/2022   HDL 66 01/29/2022   LDLCALC 126 (H) 01/29/2022   TRIG 136 01/29/2022   CHOLHDL 3.3 01/29/2022  on crestor '20mg'$  daily  LDL goal is less than 100 Check lipid panel today  Avoid fried fatty foods  Diffuse large B-cell lymphoma ([place]) Stated that she has completed chemotherapy , does not want to do PET scan , not willing to do treatment again because it was hard on her body . Patient was encouraged to discuss her concerns with hematology, states that her children are aware  of her decision

## 2022-04-02 ENCOUNTER — Other Ambulatory Visit: Payer: Self-pay | Admitting: Nurse Practitioner

## 2022-04-02 LAB — LIPID PANEL
Chol/HDL Ratio: 2.3 ratio (ref 0.0–4.4)
Cholesterol, Total: 178 mg/dL (ref 100–199)
HDL: 78 mg/dL (ref >39)
LDL Chol Calc (NIH): 65 mg/dL (ref 0–99)
Triglycerides: 219 mg/dL — ABNORMAL HIGH (ref 0–149)
VLDL Cholesterol Cal: 35 mg/dL (ref 5–40)

## 2022-04-02 MED ORDER — ROSUVASTATIN CALCIUM 40 MG PO TABS: 40.0000 mg | ORAL_TABLET | Freq: Every day | ORAL | 1 refills | Status: DC

## 2022-04-02 NOTE — Progress Notes (Signed)
LDL is much improved but not at goal of less than 55, triglycerides elevated, patient should take OTC fish oil 1000 mg twice daily, start rosuvastatin 40 mg daily

## 2022-05-02 DIAGNOSIS — H35033 Hypertensive retinopathy, bilateral: Secondary | ICD-10-CM | POA: Diagnosis not present

## 2022-05-07 ENCOUNTER — Ambulatory Visit (INDEPENDENT_AMBULATORY_CARE_PROVIDER_SITE_OTHER): Payer: Medicare PPO | Admitting: Nurse Practitioner

## 2022-05-07 ENCOUNTER — Encounter: Payer: Self-pay | Admitting: Hematology

## 2022-05-07 ENCOUNTER — Encounter: Payer: Self-pay | Admitting: Nurse Practitioner

## 2022-05-07 VITALS — BP 128/78 | HR 56 | Ht 66.5 in | Wt 169.1 lb

## 2022-05-07 DIAGNOSIS — E7800 Pure hypercholesterolemia, unspecified: Secondary | ICD-10-CM | POA: Diagnosis not present

## 2022-05-07 DIAGNOSIS — I10 Essential (primary) hypertension: Secondary | ICD-10-CM | POA: Diagnosis not present

## 2022-05-07 DIAGNOSIS — Z2821 Immunization not carried out because of patient refusal: Secondary | ICD-10-CM

## 2022-05-07 MED ORDER — ATENOLOL 50 MG PO TABS: 50.0000 mg | ORAL_TABLET | Freq: Every day | ORAL | 2 refills | Status: DC

## 2022-05-07 MED ORDER — HYDROCHLOROTHIAZIDE 12.5 MG PO CAPS: 12.5000 mg | ORAL_CAPSULE | Freq: Every day | ORAL | 0 refills | Status: DC

## 2022-05-07 NOTE — Patient Instructions (Signed)
Please continue your current medications Fasting labs next week as dicussed.     It is important that you exercise regularly at least 30 minutes 5 times a week.  Think about what you will eat, plan ahead. Choose " clean, green, fresh or frozen" over canned, processed or packaged foods which are more sugary, salty and fatty. 70 to 75% of food eaten should be vegetables and fruit. Three meals at set times with snacks allowed between meals, but they must be fruit or vegetables. Aim to eat over a 12 hour period , example 7 am to 7 pm, and STOP after  your last meal of the day. Drink water,generally about 64 ounces per day, no other drink is as healthy. Fruit juice is best enjoyed in a healthy way, by EATING the fruit.  Thanks for choosing [facility], we consider it a privelige to serve you.

## 2022-05-07 NOTE — Assessment & Plan Note (Addendum)
Lab Results  Component Value Date   CHOL 178 04/01/2022   HDL 78 04/01/2022   LDLCALC 65 04/01/2022   TRIG 219 (H) 04/01/2022   CHOLHDL 2.3 04/01/2022  The ASCVD Risk score (Arnett DK, et al., 2019) failed to calculate for the following reasons:   The patient has a prior MI or stroke diagnosis. LDL goal is less than 55 due to her previous history of CVA Currently on rosuvastatin 40 mg daily Check fasting lipid panel Avoid fatty fried foods

## 2022-05-07 NOTE — Progress Notes (Signed)
Established Patient Office Visit  Subjective   Patient ID: Molly Patel, female    DOB: [birth year]  Age: 76 y.o. MRN: [id number]  Chief Complaint  Patient presents with   Follow-up    4 week f/u,    Molly Patel with past medical history of hypertension, hyperlipidemia, CVA, insomnia, cognitive changes presents for follow-up for hypertension  Hypertension.  Currently on atenolol 50 mg daily, hydrochlorothiazide 12.5 mg daily patient denies dizziness, chest pain, edema.  Hyperlipidemia.  Currently on rosuvastatin 40 mg daily.  Patient denies any adverse reactions to her current medications.  Due for flu vaccine patient declined the  vaccines today.  Encouraged to get her shingles vaccine at the pharmacy    HPI    Review of Systems  Constitutional: Negative.  Negative for chills, fever and weight loss.  Respiratory: Negative.  Negative for cough, hemoptysis and sputum production.   Cardiovascular: Negative.  Negative for chest pain, palpitations and orthopnea.  Musculoskeletal:  Negative for back pain, myalgias and neck pain.  Neurological:  Negative for tingling, tremors and headaches.  Psychiatric/Behavioral: Negative.  Negative for depression, hallucinations, substance abuse and suicidal ideas.       Objective:     BP 128/78 (BP Location: Left Arm)   Pulse (!) 56   Ht 5' 6.5" (1.689 m)   Wt 169 lb 1.9 oz (76.7 kg)   SpO2 93%   BMI 26.89 kg/m    Physical Exam Constitutional:      General: She is not in acute distress.    Appearance: She is not ill-appearing, toxic-appearing or diaphoretic.  Cardiovascular:     Rate and Rhythm: Normal rate and regular rhythm.     Pulses: Normal pulses.     Heart sounds: Normal heart sounds. No murmur heard.    No friction rub. No gallop.  Pulmonary:     Effort: Pulmonary effort is normal. No respiratory distress.     Breath sounds: Normal breath sounds. No stridor. No wheezing, rhonchi or rales.  Chest:     Chest wall:  No tenderness.  Musculoskeletal:        General: No swelling, tenderness, deformity or signs of injury.     Right lower leg: No edema.     Left lower leg: No edema.  Skin:    General: Skin is warm and dry.     Capillary Refill: Capillary refill takes less than 2 seconds.     Coloration: Skin is not jaundiced or pale.     Findings: No bruising, erythema, lesion or rash.  Neurological:     Mental Status: She is alert and oriented to person, place, and time.     Gait: Gait normal.  Psychiatric:        Mood and Affect: Mood normal.        Behavior: Behavior normal.        Judgment: Judgment normal.      No results found for any visits on 05/07/22.    The ASCVD Risk score (Arnett DK, et al., 2019) failed to calculate for the following reasons:   The patient has a prior MI or stroke diagnosis    Assessment & Plan:   Problem List Items Addressed This Visit       Cardiovascular and Mediastinum   Hypertension    BP Readings from Last 3 Encounters:  05/07/22 128/78  04/01/22 (!) 160/78  02/21/22 (!) 176/74  Chronic medical condition well-controlled on atenolol 50 mg daily, hydrochlorothiazide 12.5 mg  daily Continue current medications DASH diet advised engage in regular daily walking exercises at least 150 minutes weekly as tolerated Check BMP      Relevant Medications   hydrochlorothiazide (MICROZIDE) 12.5 MG capsule   atenolol (TENORMIN) 50 MG tablet   Other Relevant Orders   Basic Metabolic Panel (BMET)     Other   Hyperlipidemia    Lab Results  Component Value Date   CHOL 178 04/01/2022   HDL 78 04/01/2022   LDLCALC 65 04/01/2022   TRIG 219 (H) 04/01/2022   CHOLHDL 2.3 04/01/2022  The ASCVD Risk score (Arnett DK, et al., 2019) failed to calculate for the following reasons:   The patient has a prior MI or stroke diagnosis. LDL goal is less than 55 due to her previous history of CVA Currently on rosuvastatin 40 mg daily Check fasting lipid panel Avoid fatty  fried foods      Relevant Medications   hydrochlorothiazide (MICROZIDE) 12.5 MG capsule   atenolol (TENORMIN) 50 MG tablet   Other Relevant Orders   Lipid Profile   Other Visit Diagnoses     Influenza vaccine refused    -  Primary       Return in about 6 months (around 11/05/2022) for CPE.    Renee Rival, FNP

## 2022-05-07 NOTE — Assessment & Plan Note (Signed)
BP Readings from Last 3 Encounters:  05/07/22 128/78  04/01/22 (!) 160/78  02/21/22 (!) 176/74  Chronic medical condition well-controlled on atenolol 50 mg daily, hydrochlorothiazide 12.5 mg daily Continue current medications DASH diet advised engage in regular daily walking exercises at least 150 minutes weekly as tolerated Check BMP

## 2022-05-08 ENCOUNTER — Telehealth: Payer: Self-pay | Admitting: *Deleted

## 2022-05-08 NOTE — Telephone Encounter (Signed)
Received call from patient [phone]~ telephone.   Patient requested follow up appointment with Dr. Arnoldo Morale to discuss removal of Port. States that she has decided to forego any further treatment for lymphoma.   Appointment scheduled.

## 2022-05-15 ENCOUNTER — Encounter: Payer: Self-pay | Admitting: Hematology

## 2022-05-15 LAB — BASIC METABOLIC PANEL
BUN/Creatinine Ratio: 16 (ref 12–28)
BUN: 14 mg/dL (ref 8–27)
CO2: 24 mmol/L (ref 20–29)
Calcium: 9.8 mg/dL (ref 8.7–10.3)
Chloride: 98 mmol/L (ref 96–106)
Creatinine, Ser: 0.9 mg/dL (ref 0.57–1.00)
Glucose: 81 mg/dL (ref 70–99)
Potassium: 4.4 mmol/L (ref 3.5–5.2)
Sodium: 141 mmol/L (ref 134–144)
eGFR: 65 mL/min/{1.73_m2} (ref >59)

## 2022-05-15 LAB — LIPID PANEL
Chol/HDL Ratio: 1.6 ratio (ref 0.0–4.4)
Cholesterol, Total: 143 mg/dL (ref 100–199)
HDL: 87 mg/dL (ref >39)
LDL Chol Calc (NIH): 40 mg/dL (ref 0–99)
Triglycerides: 82 mg/dL (ref 0–149)
VLDL Cholesterol Cal: 16 mg/dL (ref 5–40)

## 2022-05-15 NOTE — Progress Notes (Signed)
Labs are normal , continue current medications

## 2022-05-16 ENCOUNTER — Other Ambulatory Visit: Payer: Self-pay

## 2022-05-16 MED ORDER — ROSUVASTATIN CALCIUM 40 MG PO TABS: 40.0000 mg | ORAL_TABLET | Freq: Every day | ORAL | 1 refills | Status: DC

## 2022-05-27 ENCOUNTER — Ambulatory Visit (INDEPENDENT_AMBULATORY_CARE_PROVIDER_SITE_OTHER): Payer: Medicare PPO | Admitting: General Surgery

## 2022-05-27 ENCOUNTER — Encounter: Payer: Self-pay | Admitting: General Surgery

## 2022-05-27 VITALS — BP 156/79 | HR 50 | Temp 97.5°F | Resp 12 | Ht 66.5 in | Wt 168.0 lb

## 2022-05-27 DIAGNOSIS — C8330 Diffuse large B-cell lymphoma, unspecified site: Secondary | ICD-10-CM

## 2022-05-28 NOTE — H&P (Signed)
Molly Patel; [id number]; [birth year]   HPI Patient referred herself back to my care for Port-A-Cath removal.  She states she is not undergoing any further chemotherapy and would like the Port-A-Cath removed. Past Medical History:  Diagnosis Date   Angioedema    B-cell lymphoma ([place])    Depression    Dizziness    Echocardiogram with ECG monitoring 02/23/2011   mild LVH, normal event monitor    Hyperlipidemia    Hypertension    takes meds daily   Incontinence    Insomnia    Ovarian cyst    Port-A-Cath in place 10/29/2021   Seizures ([place])    2 weeks ago   Stroke [facility]) 08/25/2014   Syncope    Vitamin D deficiency     Past Surgical History:  Procedure Laterality Date   ABDOMINAL HYSTERECTOMY     COLONOSCOPY N/A 11/02/2014   Procedure: COLONOSCOPY;  Surgeon: Rogene Houston, MD;  [facility];  Service: Endoscopy;  Laterality: N/A;  140 - moved to 12:55 - Ann to notify pt   CRANIOTOMY  06/29/2012   Procedure: CRANIOTOMY TUMOR EXCISION;  Surgeon: Faythe Ghee, MD;  Location: [place] [facility]URO ORS;  Service: Neurosurgery;  Laterality: Left;  Left frontal Craniotomy for Meningioma   ESOPHAGEAL DILATION N/A 11/02/2014   Procedure: ESOPHAGEAL DILATION;  Surgeon: Rogene Houston, MD;  [facility];  Service: Endoscopy;  Laterality: N/A;   ESOPHAGOGASTRODUODENOSCOPY N/A 11/02/2014   Procedure: ESOPHAGOGASTRODUODENOSCOPY (EGD);  Surgeon: Rogene Houston, MD;  [facility];  Service: Endoscopy;  Laterality: N/A;   PORTACATH PLACEMENT Left 10/18/2021   Procedure: INSERTION PORT-A-CATH;  Surgeon: Aviva Signs, MD;  Location: [facility];  Service: General;  Laterality: Left;   VESICOVAGINAL FISTULA CLOSURE W/ TAH      Family History  Problem Relation Age of Onset   Stroke Father    Asthma Grandchild    Arthritis Sister    Diabetes Sister    Colon cancer Brother     Current Outpatient Medications on File Prior to Visit  Medication Sig Dispense Refill   allopurinol  (ZYLOPRIM) 300 MG tablet TAKE 1 TABLET BY MOUTH EVERY DAY 30 tablet 2   aspirin EC 325 MG tablet Take 1 tablet (325 mg total) by mouth daily. 30 tablet 0   atenolol (TENORMIN) 50 MG tablet Take 1 tablet (50 mg total) by mouth daily. 90 tablet 2   cholecalciferol (VITAMIN D) 25 MCG (1000 UNIT) tablet TAKE 1 TABLET EVERY DAY (Patient taking differently: 1,000 Units daily.) 90 tablet 3   CYCLOPHOSPHAMIDE IV Inject into the vein every 21 ( twenty-one) days.     donepezil (ARICEPT) 5 MG tablet Take 1 tablet (5 mg total) by mouth at bedtime. 90 tablet 1   DOXOrubicin HCl (ADRIAMYCIN IV) Inject into the vein every 21 ( twenty-one) days.     hydrochlorothiazide (MICROZIDE) 12.5 MG capsule Take 1 capsule (12.5 mg total) by mouth daily. 90 capsule 0   HYDROcodone-acetaminophen (NORCO/VICODIN) 5-325 MG tablet Take 1 tablet by mouth every 6 (six) hours as needed for moderate pain or severe pain. 20 tablet 0   Lactulose 20 GM/30ML SOLN Take 30 mLs (20 g total) by mouth 3 (three) times daily as needed. 450 mL 0   levETIRAcetam (KEPPRA) 500 MG tablet Take 1 tablet (500 mg total) by mouth 2 (two) times daily. Needs to establish with new PCP.  Dr. Buelah Manis is no longer at this office.  Must be seen before  more refills. 60 tablet 0   lidocaine-prilocaine (EMLA) cream Apply a small amount to port a cath site and cover with plastic wrap 1 hour prior to infusion appointments 30 g 3   memantine (NAMENDA) 5 MG tablet Take 5 mg by mouth daily.     predniSONE (DELTASONE) 20 MG tablet Take 5 tablets (100 mg) by mouth daily for 5 days starting on the first day of each treatment.  DO NOT take on the weeks you are not receiving chemotherapy treatment. 30 tablet 0   prochlorperazine (COMPAZINE) 10 MG tablet Take 1 tablet (10 mg total) by mouth every 6 (six) hours as needed (Nausea or vomiting). 30 tablet 6   riTUXimab (RITUXAN IV) Inject into the vein every 21 ( twenty-one) days.     rosuvastatin (CRESTOR) 40 MG tablet Take 1  tablet (40 mg total) by mouth daily. 90 tablet 1   traZODone (DESYREL) 50 MG tablet Take 0.5-1 tablets (25-50 mg total) by mouth at bedtime as needed for sleep. 30 tablet 0   VINCRISTINE SULFATE IV Inject into the vein every 21 ( twenty-one) days.     No current facility-administered medications on file prior to visit.    No Known Allergies  Social History   Substance and Sexual Activity  Alcohol Use No    Social History   Tobacco Use  Smoking Status Former   Years: 7.00   Types: Cigarettes   Quit date: 08/25/1985   Years since quitting: 36.7  Smokeless Tobacco Never    Review of Systems  Constitutional: Negative.   HENT: Negative.    Eyes: Negative.   Respiratory: Negative.    Cardiovascular: Negative.   Gastrointestinal: Negative.   Genitourinary: Negative.   Musculoskeletal: Negative.   Skin: Negative.   Neurological: Negative.   Endo/Heme/Allergies: Negative.   Psychiatric/Behavioral: Negative.      Objective   Vitals:   05/27/22 1356  BP: (!) 156/79  Pulse: (!) 50  Resp: 12  Temp: (!) 97.5 F (36.4 C)  SpO2: 98%    Physical Exam Vitals reviewed.  Constitutional:      Appearance: Normal appearance. She is normal weight. She is not ill-appearing.  HENT:     Head: Normocephalic and atraumatic.  Cardiovascular:     Rate and Rhythm: Normal rate and regular rhythm.     Heart sounds: Normal heart sounds. No murmur heard.    No friction rub. No gallop.  Pulmonary:     Effort: Pulmonary effort is normal. No respiratory distress.     Breath sounds: Normal breath sounds. No stridor. No wheezing, rhonchi or rales.     Comments: Port-A-Cath cath in place left upper chest Skin:    General: Skin is warm and dry.  Neurological:     Mental Status: She is alert and oriented to person, place, and time.   Previous operative note reviewed  Assessment  Diffuse large B-cell lymphoma, patient discontinuing further chemotherapy Plan  Patient is scheduled for  Port-A-Cath removal in the minor procedure room on 06/12/2022.  The risks and benefits of the procedure were fully explained to the patient, who gave informed consent.

## 2022-05-28 NOTE — Progress Notes (Signed)
Molly Patel; [id number]; 1946/07/01   HPI Patient referred herself back to my care for Port-A-Cath removal.  She states she is not undergoing any further chemotherapy and would like the Port-A-Cath removed. Past Medical History:  Diagnosis Date   Angioedema    B-cell lymphoma ([place])    Depression    Dizziness    Echocardiogram with ECG monitoring 02/23/2011   mild LVH, normal event monitor    Hyperlipidemia    Hypertension    takes meds daily   Incontinence    Insomnia    Ovarian cyst    Port-A-Cath in place 10/29/2021   Seizures ([place])    2 weeks ago   Stroke [facility]) 08/25/2014   Syncope    Vitamin D deficiency     Past Surgical History:  Procedure Laterality Date   ABDOMINAL HYSTERECTOMY     COLONOSCOPY N/A 11/02/2014   Procedure: COLONOSCOPY;  Surgeon: Rogene Houston, MD;  [facility];  Service: Endoscopy;  Laterality: N/A;  140 - moved to 12:55 - Ann to notify pt   CRANIOTOMY  06/29/2012   Procedure: CRANIOTOMY TUMOR EXCISION;  Surgeon: Faythe Ghee, MD;  Location: [place] NEURO ORS;  Service: Neurosurgery;  Laterality: Left;  Left frontal Craniotomy for Meningioma   ESOPHAGEAL DILATION N/A 11/02/2014   Procedure: ESOPHAGEAL DILATION;  Surgeon: Rogene Houston, MD;  [facility];  Service: Endoscopy;  Laterality: N/A;   ESOPHAGOGASTRODUODENOSCOPY N/A 11/02/2014   Procedure: ESOPHAGOGASTRODUODENOSCOPY (EGD);  Surgeon: Rogene Houston, MD;  [facility];  Service: Endoscopy;  Laterality: N/A;   PORTACATH PLACEMENT Left 10/18/2021   Procedure: INSERTION PORT-A-CATH;  Surgeon: Aviva Signs, MD;  Location[facility];  Service: General;  Laterality: Left;   VESICOVAGINAL FISTULA CLOSURE W/ TAH      Family History  Problem Relation Age of Onset   Stroke Father    Asthma Grandchild    Arthritis Sister    Diabetes Sister    Colon cancer Brother     Current Outpatient Medications on File Prior to Visit  Medication Sig Dispense Refill   allopurinol  (ZYLOPRIM) 300 MG tablet TAKE 1 TABLET BY MOUTH EVERY DAY 30 tablet 2   aspirin EC 325 MG tablet Take 1 tablet (325 mg total) by mouth daily. 30 tablet 0   atenolol (TENORMIN) 50 MG tablet Take 1 tablet (50 mg total) by mouth daily. 90 tablet 2   cholecalciferol (VITAMIN D) 25 MCG (1000 UNIT) tablet TAKE 1 TABLET EVERY DAY (Patient taking differently: 1,000 Units daily.) 90 tablet 3   CYCLOPHOSPHAMIDE IV Inject into the vein every 21 ( twenty-one) days.     donepezil (ARICEPT) 5 MG tablet Take 1 tablet (5 mg total) by mouth at bedtime. 90 tablet 1   DOXOrubicin HCl (ADRIAMYCIN IV) Inject into the vein every 21 ( twenty-one) days.     hydrochlorothiazide (MICROZIDE) 12.5 MG capsule Take 1 capsule (12.5 mg total) by mouth daily. 90 capsule 0   HYDROcodone-acetaminophen (NORCO/VICODIN) 5-325 MG tablet Take 1 tablet by mouth every 6 (six) hours as needed for moderate pain or severe pain. 20 tablet 0   Lactulose 20 GM/30ML SOLN Take 30 mLs (20 g total) by mouth 3 (three) times daily as needed. 450 mL 0   levETIRAcetam (KEPPRA) 500 MG tablet Take 1 tablet (500 mg total) by mouth 2 (two) times daily. Needs to establish with new PCP.  Dr. Buelah Manis is no longer at this office.  Must be seen before  more refills. 60 tablet 0   lidocaine-prilocaine (EMLA) cream Apply a small amount to port a cath site and cover with plastic wrap 1 hour prior to infusion appointments 30 g 3   memantine (NAMENDA) 5 MG tablet Take 5 mg by mouth daily.     predniSONE (DELTASONE) 20 MG tablet Take 5 tablets (100 mg) by mouth daily for 5 days starting on the first day of each treatment.  DO NOT take on the weeks you are not receiving chemotherapy treatment. 30 tablet 0   prochlorperazine (COMPAZINE) 10 MG tablet Take 1 tablet (10 mg total) by mouth every 6 (six) hours as needed (Nausea or vomiting). 30 tablet 6   riTUXimab (RITUXAN IV) Inject into the vein every 21 ( twenty-one) days.     rosuvastatin (CRESTOR) 40 MG tablet Take 1  tablet (40 mg total) by mouth daily. 90 tablet 1   traZODone (DESYREL) 50 MG tablet Take 0.5-1 tablets (25-50 mg total) by mouth at bedtime as needed for sleep. 30 tablet 0   VINCRISTINE SULFATE IV Inject into the vein every 21 ( twenty-one) days.     No current facility-administered medications on file prior to visit.    No Known Allergies  Social History   Substance and Sexual Activity  Alcohol Use No    Social History   Tobacco Use  Smoking Status Former   Years: 7.00   Types: Cigarettes   Quit date: 08/25/1985   Years since quitting: 36.7  Smokeless Tobacco Never    Review of Systems  Constitutional: Negative.   HENT: Negative.    Eyes: Negative.   Respiratory: Negative.    Cardiovascular: Negative.   Gastrointestinal: Negative.   Genitourinary: Negative.   Musculoskeletal: Negative.   Skin: Negative.   Neurological: Negative.   Endo/Heme/Allergies: Negative.   Psychiatric/Behavioral: Negative.      Objective   Vitals:   05/27/22 1356  BP: (!) 156/79  Pulse: (!) 50  Resp: 12  Temp: (!) 97.5 F (36.4 C)  SpO2: 98%    Physical Exam Vitals reviewed.  Constitutional:      Appearance: Normal appearance. She is normal weight. She is not ill-appearing.  HENT:     Head: Normocephalic and atraumatic.  Cardiovascular:     Rate and Rhythm: Normal rate and regular rhythm.     Heart sounds: Normal heart sounds. No murmur heard.    No friction rub. No gallop.  Pulmonary:     Effort: Pulmonary effort is normal. No respiratory distress.     Breath sounds: Normal breath sounds. No stridor. No wheezing, rhonchi or rales.     Comments: Port-A-Cath cath in place left upper chest Skin:    General: Skin is warm and dry.  Neurological:     Mental Status: She is alert and oriented to person, place, and time.   Previous operative note reviewed  Assessment  Diffuse large B-cell lymphoma, patient discontinuing further chemotherapy Plan  Patient is scheduled for  Port-A-Cath removal on 06/12/2022.  The risks and benefits of the procedure were fully explained to the patient, who gave informed consent.

## 2022-06-12 ENCOUNTER — Encounter (HOSPITAL_COMMUNITY): Payer: Self-pay | Admitting: General Surgery

## 2022-06-12 ENCOUNTER — Ambulatory Visit (HOSPITAL_COMMUNITY)
Admission: RE | Admit: 2022-06-12 | Discharge: 2022-06-12 | Disposition: A | Discharge status: Home / Self Care | Payer: Medicare PPO | Source: Ambulatory Visit | Attending: General Surgery | Admitting: General Surgery

## 2022-06-12 ENCOUNTER — Encounter (HOSPITAL_COMMUNITY)
Admission: RE | Disposition: A | Discharge status: Home / Self Care | Payer: Self-pay | Source: Ambulatory Visit | Attending: General Surgery

## 2022-06-12 DIAGNOSIS — C8330 Diffuse large B-cell lymphoma, unspecified site: Secondary | ICD-10-CM | POA: Diagnosis present

## 2022-06-12 DIAGNOSIS — C8510 Unspecified B-cell lymphoma, unspecified site: Secondary | ICD-10-CM | POA: Diagnosis not present

## 2022-06-12 DIAGNOSIS — Z9221 Personal history of antineoplastic chemotherapy: Secondary | ICD-10-CM | POA: Insufficient documentation

## 2022-06-12 HISTORY — PX: PORT-A-CATH REMOVAL: SHX5289

## 2022-06-12 SURGERY — MINOR REMOVAL PORT-A-CATH
Anesthesia: LOCAL

## 2022-06-12 MED ORDER — LIDOCAINE HCL (PF) 1 % IJ SOLN
INTRAMUSCULAR | Status: AC
  Filled 2022-06-12: qty 30

## 2022-06-12 SURGICAL SUPPLY — 19 items
APPLICATOR CHLORAPREP 10.5 ORG (MISCELLANEOUS) ×1 IMPLANT
CLOTH BEACON ORANGE TIMEOUT ST (SAFETY) ×1 IMPLANT
DECANTER SPIKE VIAL GLASS SM (MISCELLANEOUS) ×1 IMPLANT
DERMABOND ADVANCED .7 DNX12 (GAUZE/BANDAGES/DRESSINGS) ×1 IMPLANT
DRAPE HALF SHEET 40X57 (DRAPES) IMPLANT
ELECT REM PT RETURN 9FT ADLT (ELECTROSURGICAL) ×1
ELECTRODE REM PT RTRN 9FT ADLT (ELECTROSURGICAL) ×1 IMPLANT
GLOVE BIOGEL PI IND STRL 7.0 (GLOVE) ×2 IMPLANT
GLOVE SURG SS PI 7.5 STRL IVOR (GLOVE) ×1 IMPLANT
GOWN STRL REUS W/TWL LRG LVL3 (GOWN DISPOSABLE) IMPLANT
NDL HYPO 25X1 1.5 SAFETY (NEEDLE) ×1 IMPLANT
NEEDLE HYPO 25X1 1.5 SAFETY (NEEDLE) ×1 IMPLANT
PENCIL SMOKE EVACUATOR COATED (MISCELLANEOUS) IMPLANT
SPONGE GAUZE 2X2 8PLY STRL LF (GAUZE/BANDAGES/DRESSINGS) ×1 IMPLANT
SUT MNCRL AB 4-0 PS2 18 (SUTURE) ×1 IMPLANT
SUT VIC AB 3-0 SH 27 (SUTURE) ×1
SUT VIC AB 3-0 SH 27X BRD (SUTURE) ×1 IMPLANT
SYR CONTROL 10ML LL (SYRINGE) ×1 IMPLANT
TOWEL OR 17X26 4PK STRL BLUE (TOWEL DISPOSABLE) ×1 IMPLANT

## 2022-06-12 NOTE — Op Note (Signed)
Patient:  Molly Patel  DOB:  [birth year]  MRN:  [id number]   Preop Diagnosis:  B-cell lymphoma, finished with chemotherapy  Postop Diagnosis: Same  Procedure: Port-A-Cath removal  Surgeon: Aviva Signs, MD  Anes: Local  Indications: Patient is a 75 year old black female who states she is done with chemotherapy and would like the Port-A-Cath removed.  The risks and benefits of the procedure including bleeding and infection were fully explained to the patient, who gave informed consent.  Procedure note: The patient was placed in supine position.  The procedure was performed in the minor procedure room.  The left upper chest was prepped and draped using usual sterile technique with ChloraPrep.  Surgical site confirmation was performed.  1% Xylocaine was used for local anesthesia.  Incision was made through the previous surgical incision site.  This was taken down to the Port-A-Cath.  The Port-A-Cath was removed in total without difficulty.  Any bleeding was controlled using 3-0 Vicryl interrupted sutures.  The skin was closed using a 4-0 Monocryl subcuticular suture.  Dermabond was applied.  All tape and needle counts were correct at the end of the procedure.  The patient was discharged from the minor procedure room in good and stable condition.  Complications: None  EBL: Minimal  Specimen: None

## 2022-06-12 NOTE — Interval H&P Note (Signed)
History and Physical Interval Note:  06/12/2022 9:46 AM  Molly Patel  has presented today for surgery, with the diagnosis of PORT A CATH IN PLACE LYMPHOMA.  The various methods of treatment have been discussed with the patient and family. After consideration of risks, benefits and other options for treatment, the patient has consented to  Procedure(s): MINOR REMOVAL PORT-A-CATH (N/A) as a surgical intervention.  The patient's history has been reviewed, patient examined, no change in status, stable for surgery.  I have reviewed the patient's chart and labs.  Questions were answered to the patient's satisfaction.     Aviva Signs

## 2022-06-16 ENCOUNTER — Encounter (HOSPITAL_COMMUNITY): Payer: Self-pay | Admitting: General Surgery

## 2022-06-23 ENCOUNTER — Telehealth: Payer: Self-pay | Admitting: *Deleted

## 2022-06-23 NOTE — Patient Outreach (Signed)
  Care Coordination [facility] Note Transition Care Management Unsuccessful Follow-up Telephone Call  Date of discharge and from where: [facility] on 06/21/22  Attempts:  1st Attempt  Reason for unsuccessful TCM follow-up call:  No answer/busy  Chong Sicilian, BSN, RN-BC RN Care Coordinator [place]: [phone] Main #: [phone]

## 2022-06-24 ENCOUNTER — Telehealth: Payer: Self-pay | Admitting: *Deleted

## 2022-06-24 ENCOUNTER — Encounter: Payer: Self-pay | Admitting: *Deleted

## 2022-06-24 NOTE — Patient Outreach (Signed)
  Care Coordination [facility] Note Transition Care Management Follow-up Telephone Call Date of discharge and from where: [facility] on 06/21/22 How have you been since you were released from the hospital? "I'm doing better. Still having some diarrhea." Any questions or concerns? No  Items Reviewed: Did the pt receive and understand the discharge instructions provided? Yes  Medications obtained and verified? Yes  Other? Yes Reviewed diarrhea management with diet, fluids, and imodium. Reviewed when to seek medical attention.  Any new allergies since your discharge? No  Dietary orders reviewed? Yes Do you have support at home? Yes   Home Care and Equipment/Supplies: Were home health services ordered? no If so, what is the name of the agency? N/a  Has the agency set up a time to come to the patient's home? not applicable Were any new equipment or medical supplies ordered?  No What is the name of the medical supply agency? N/a Were you able to get the supplies/equipment? not applicable Do you have any questions related to the use of the equipment or supplies? No  Functional Questionnaire: (I = Independent and D = Dependent) ADLs: I  Bathing/Dressing- I  Meal Prep- I  Eating- I  Maintaining continence- I  Transferring/Ambulation- I  Managing Meds- I  Follow up appointments reviewed:  PCP Hospital f/u appt confirmed? Yes  Scheduled to see Alvira Monday, FNP on 07/01/22 at [facility] [place] f/u appt confirmed? No Not indicated Are transportation arrangements needed? No  If their condition worsens, is the pt aware to call PCP or go to the Emergency Dept.? Yes Was the patient provided with contact information for the PCP's office or ED? Yes Was to pt encouraged to call back with questions or concerns? Yes  SDOH assessments and interventions completed:   Yes  Care Coordination Interventions Activated:  No   Care Coordination Interventions:  No Care Coordination  interventions needed at this time.   Encounter Outcome:  Pt. Visit Completed    Chong Sicilian, BSN, RN-BC RN Care Coordinator [place] Direct Dial: [phone] Main #: [phone]

## 2022-07-01 ENCOUNTER — Ambulatory Visit (INDEPENDENT_AMBULATORY_CARE_PROVIDER_SITE_OTHER): Payer: Medicare PPO | Admitting: Family Medicine

## 2022-07-01 ENCOUNTER — Encounter: Payer: Medicare PPO | Admitting: Nurse Practitioner

## 2022-07-01 ENCOUNTER — Encounter: Payer: Self-pay | Admitting: Family Medicine

## 2022-07-01 VITALS — BP 116/80 | HR 57 | Ht 66.5 in | Wt 165.0 lb

## 2022-07-01 DIAGNOSIS — R159 Full incontinence of feces: Secondary | ICD-10-CM

## 2022-07-01 DIAGNOSIS — R569 Unspecified convulsions: Secondary | ICD-10-CM

## 2022-07-01 DIAGNOSIS — R197 Diarrhea, unspecified: Secondary | ICD-10-CM | POA: Diagnosis not present

## 2022-07-01 DIAGNOSIS — Z0001 Encounter for general adult medical examination with abnormal findings: Secondary | ICD-10-CM

## 2022-07-01 DIAGNOSIS — G40909 Epilepsy, unspecified, not intractable, without status epilepticus: Secondary | ICD-10-CM

## 2022-07-01 NOTE — Patient Instructions (Addendum)
I appreciate the opportunity to provide care to you today!    Follow up:  1 week fecal  Labs: next visit in 1 week  Please do not drive until you see the neurologist  Referral: neurology                Anal Sphincter Exercises  Sit comfortably with your knees slightly apart. Now imagine that you are trying to stop  yourself passing wind from the bowel. To do this you must squeeze the muscle around  the back passage. Try squeezing and lifting that muscle as tightly as you can, as if you  are really worried that you are about to leak. You should be able to feel the muscle  move. Your buttocks, tummy and legs should not move at all. You should be aware of  the skin around the back passage tightening and being pulled up and away from your  chair. Really try to feel this. You are now exercising your anal sphincter. You should  not need to hold your breath when you tighten the muscles!    Now imagine that the sphincter muscle is a lift. When you squeeze as tightly as you  can your lift goes up to the 4th floor. But you cannot hold it there for very long, and it  will not get you safely to the toilet as it will get tired very quickly. So now squeeze more  gently, take your lift only up to the 2nd floor. Feel how much longer you can hold it than  at the maximum squeeze.      Practising your exercises    1. Sit, stand or lie with your knees slightly apart. Tighten and pull up the sphincter  muscles as tightly as you can. Hold tightened for at least five seconds, then relax for  about 4 seconds.    Repeat five times. This will work on the strength of your muscles.    2. Next, pull the muscles up to about half of their maximum squeeze. See how long  you can hold this for. Then relax for at least 10 seconds.  Repeat twice. This will work on the endurance or staying power of your muscles.    3. Pull up the muscles as quickly and tightly as you can and then relax and then pull up  again,  and see how many times you can do this before you get tired. Try for five quick  pull-ups.    4. Do these exercises - five as hard as you can, two as long as you can and five quick  pull-ups - four to six times every day.  5. As the muscles get stronger, you will find that you can hold for longer than five  seconds, and that you can do more pull-ups each time without the muscle getting tired.    6. It takes time for exercise to make muscle stronger. You may need to exercise  regularly for several months before the muscles gain their full strength.     Please continue to a heart-healthy diet and increase your physical activities. Try to exercise for 14mns at least three times a week.      It was a pleasure to see you and I look forward to continuing to work together on your health and well-being. Please do not hesitate to call the office if you need care or have questions about your care.   Have a wonderful day and week. With  Gratitude, Alvira Monday MSN, FNP-BC

## 2022-07-01 NOTE — Progress Notes (Unsigned)
Complete physical exam  Patient: Molly Patel   DOB: 1946-08-17   76 y.o. Female  MRN: [id number]  Subjective:    Chief Complaint  Patient presents with   Annual Exam    Cpe today, states she was in the hospital due to seizure, while in there she tested positive for covid, has lingering diarrhea from all of this. Would like to discuss home health aide due to seizures.     Molly Patel is a 76 y.o. female who presents today for a complete physical exam. She reports consuming a general diet. The patient does not participate in regular exercise at present. She generally feels well. She reports sleeping fairly well. She does have additional problems to discuss today.    Seizure: She reports having a seizure on 06/19/2022.  She was admitted to the hospital on 06/20/2019 for altered mental status.  The patient noted that she had a seizure in the parking lot of her car,and she was postictal at the time of her EMS assessment.  She reports being afraid to drive due to frequent seizure episodes.  She takes Keppra 500 mg twice daily and reports compliance with treatment regimen.  Fecal incontinence: She reports frequent episode of diarrhea.  She has been taking loperamide with minimal relief of her symptoms.  She reports a diet that is high in fiber and staying hydrated.  She denies mucus or blood in her stool noting that she has 3-4 loose stools daily.  Most recent fall risk assessment:    07/01/2022   10:41 AM  [place] in the past year? 0  Number falls in past yr: 1  Injury with Fall? 0  Risk for fall due to : History of fall(s);Other (Comment)  Follow up Falls evaluation completed     Most recent depression screenings:    07/01/2022   10:41 AM 05/07/2022   11:19 AM  PHQ 2/9 Scores  PHQ - 2 Score 2 0  PHQ- 9 Score 6     Dental: No current dental problems and Last dental visit: may, 2023  Patient Active Problem List   Diagnosis Date Noted   Seizure disorder ([place])  07/02/2022   Fecal incontinence 07/02/2022   Encounter for annual general medical examination with abnormal findings in adult 07/02/2022   Overweight (BMI 25.0-29.9) 01/28/2022   Port-A-Cath in place 10/29/2021   Abdominal pain 10/26/2021   B-cell lymphoma ([place])    Retroperitoneal mass    Flank pain 09/25/2021   Pleural effusion    S/P thoracentesis    Glaucoma 09/21/2018   Full code status 09/21/2018   Cognitive changes 09/21/2017   History of CVA (cerebrovascular accident) 05/28/2015   Risk for falls 04/24/2014   Nasal congestion 12/16/2013   Hyperlipidemia 08/15/2013   S/P craniotomy 08/15/2013   Dry eye 09/16/2012   Insomnia 09/16/2012   OAB (overactive bladder) 09/16/2012   Lumbar stenosis 05/16/2012   Knee pain 10/06/2011   Dyspnea 04/24/2011   Hypertension    Past Medical History:  Diagnosis Date   Angioedema    B-cell lymphoma ([place])    Depression    Dizziness    Echocardiogram with ECG monitoring 02/23/2011   mild LVH, normal event monitor    Hyperlipidemia    Hypertension    takes meds daily   Incontinence    Insomnia    Ovarian cyst    Port-A-Cath in place 10/29/2021   Seizures ([place])    2 weeks ago  Stroke ([place]) 08/25/2014   Syncope    Vitamin D deficiency    Past Surgical History:  Procedure Laterality Date   ABDOMINAL HYSTERECTOMY     COLONOSCOPY N/A 11/02/2014   Procedure: COLONOSCOPY;  Surgeon: Rogene Houston, MD;  [facility];  Service: Endoscopy;  Laterality: N/A;  140 - moved to 12:55 - Ann to notify pt   CRANIOTOMY  06/29/2012   Procedure: CRANIOTOMY TUMOR EXCISION;  Surgeon: Faythe Ghee, MD;  Location: [facility];  Service: Neurosurgery;  Laterality: Left;  Left frontal Craniotomy for Meningioma   ESOPHAGEAL DILATION N/A 11/02/2014   Procedure: ESOPHAGEAL DILATION;  Surgeon: Rogene Houston, MD;  [facility];  Service: Endoscopy;  Laterality: N/A;   ESOPHAGOGASTRODUODENOSCOPY N/A 11/02/2014   Procedure:  ESOPHAGOGASTRODUODENOSCOPY (EGD);  Surgeon: Rogene Houston, MD;  [facility];  Service: Endoscopy;  Laterality: N/A;   PORT-A-CATH REMOVAL N/A 06/12/2022   Procedure: MINOR REMOVAL PORT-A-CATH;  Surgeon: Aviva Signs, MD;  Location: [facility];  Service: General;  Laterality: N/A;   PORTACATH PLACEMENT Left 10/18/2021   Procedure: INSERTION PORT-A-CATH;  Surgeon: Aviva Signs, MD;  Location: [facility];  Service: General;  Laterality: Left;   VESICOVAGINAL FISTULA CLOSURE W/ TAH     Social History   Tobacco Use   Smoking status: Former    Years: 7.00    Types: Cigarettes    Quit date: 08/25/1985    Years since quitting: 36.8   Smokeless tobacco: Never  Vaping Use   Vaping Use: Never used  Substance Use Topics   Alcohol use: No   Drug use: No   Social History   Socioeconomic History   Marital status: Divorced    Spouse name: Not on file   Number of children: 2   Years of education: Not on file   Highest education level: Not on file  Occupational History   Occupation: Retired    Comment: Tree surgeon  Tobacco Use   Smoking status: Former    Years: 7.00    Types: Cigarettes    Quit date: 08/25/1985    Years since quitting: 36.8   Smokeless tobacco: Never  Vaping Use   Vaping Use: Never used  Substance and Sexual Activity   Alcohol use: No   Drug use: No   Sexual activity: Not on file  Other Topics Concern   Not on file  Social History Narrative   Not on file   Social Determinants of Health   Financial Resource Strain: Low Risk  (06/24/2022)   Overall Financial Resource Strain (CARDIA)    Difficulty of Paying Living Expenses: Not very hard  Food Insecurity: No Food Insecurity (02/17/2022)   Hunger Vital Sign    Worried About Running Out of Food in the Last Year: Never true    Ran Out of Food in the Last Year: Never true  Transportation Needs: No Transportation Needs (06/24/2022)   PRAPARE - Hydrologist (Medical): No    Lack  of Transportation (Non-Medical): No  Physical Activity: Sufficiently Active (02/17/2022)   Exercise Vital Sign    Days of Exercise per Week: 3 days    Minutes of Exercise per Session: 50 min  Stress: Not on file  Social Connections: Not on file  Intimate Partner Violence: Not on file   Family Status  Relation Name Status   Mother  Deceased   Father  Deceased   [place]  (Not Specified)  Sister  (Not Specified)   Brother  (Not Specified)   Family History  Problem Relation Age of Onset   Stroke Father    Asthma Grandchild    Arthritis Sister    Diabetes Sister    Colon cancer Brother    No Known Allergies    Patient Care Team: Alvira Monday, [place] as PCP - General (Family Medicine) Derek Jack, MD as Medical Oncologist (Medical Oncology) Brien Mates, RN as Oncology Nurse Navigator (Medical Oncology)   Outpatient Medications Prior to Visit  Medication Sig   allopurinol (ZYLOPRIM) 300 MG tablet TAKE 1 TABLET BY MOUTH EVERY DAY   aspirin EC 325 MG tablet Take 1 tablet (325 mg total) by mouth daily.   atenolol (TENORMIN) 50 MG tablet Take 1 tablet (50 mg total) by mouth daily.   cholecalciferol (VITAMIN D) 25 MCG (1000 UNIT) tablet TAKE 1 TABLET EVERY DAY   donepezil (ARICEPT) 5 MG tablet Take 1 tablet (5 mg total) by mouth at bedtime.   hydrochlorothiazide (MICROZIDE) 12.5 MG capsule Take 1 capsule (12.5 mg total) by mouth daily.   Lactulose 20 GM/30ML SOLN Take 30 mLs (20 g total) by mouth 3 (three) times daily as needed.   levETIRAcetam (KEPPRA) 500 MG tablet Take 1 tablet (500 mg total) by mouth 2 (two) times daily. Needs to establish with new PCP.  Dr. Buelah Manis is no longer at this office.  Must be seen before more refills.   lidocaine-prilocaine (EMLA) cream Apply a small amount to port a cath site and cover with plastic wrap 1 hour prior to infusion appointments   memantine (NAMENDA) 5 MG tablet Take 5 mg by mouth in the morning.   predniSONE (DELTASONE) 20 MG  tablet Take 5 tablets (100 mg) by mouth daily for 5 days starting on the first day of each treatment.  DO NOT take on the weeks you are not receiving chemotherapy treatment.   prochlorperazine (COMPAZINE) 10 MG tablet Take 1 tablet (10 mg total) by mouth every 6 (six) hours as needed (Nausea or vomiting).   RESTASIS 0.05 % ophthalmic emulsion Place 1 drop into both eyes 2 (two) times daily as needed (dry/irritated eyes).   rosuvastatin (CRESTOR) 40 MG tablet Take 1 tablet (40 mg total) by mouth daily. (Patient taking differently: Take 20 mg by mouth in the morning and at bedtime.)   traZODone (DESYREL) 50 MG tablet Take 0.5-1 tablets (25-50 mg total) by mouth at bedtime as needed for sleep.   No facility-administered medications prior to visit.    Review of Systems  Constitutional:  Negative for chills, fever and malaise/fatigue.  HENT:  Negative for sinus pain and sore throat.   Eyes:  Negative for photophobia and pain.  Respiratory:  Negative for cough and shortness of breath.   Cardiovascular:  Negative for chest pain.  Gastrointestinal:  Positive for diarrhea. Negative for abdominal pain, blood in stool, melena, nausea and vomiting.  Genitourinary:  Negative for frequency and urgency.  Musculoskeletal:  Negative for back pain and joint pain.  Skin:  Negative for itching.  Neurological:  Negative for dizziness and headaches.  Psychiatric/Behavioral:  Negative for memory loss. The patient is not nervous/anxious.           Objective:     BP 116/80   Pulse (!) 57   Ht 5' 6.5" (1.689 m)   Wt 165 lb 0.6 oz (74.9 kg)   SpO2 97%   BMI 26.24 kg/m  BP Readings from Last 3 Encounters:  07/01/22 116/80  06/12/22 (!) 166/80  05/27/22 (!) 156/79   Wt Readings from Last 3 Encounters:  07/01/22 165 lb 0.6 oz (74.9 kg)  05/27/22 168 lb (76.2 kg)  05/07/22 169 lb 1.9 oz (76.7 kg)      Physical Exam HENT:     Head: Normocephalic.     Right Ear: External ear normal.     Left Ear:  External ear normal.     Nose: No congestion or rhinorrhea.     Mouth/Throat:     Mouth: Mucous membranes are moist.  Eyes:     Extraocular Movements: Extraocular movements intact.     Pupils: Pupils are equal, round, and reactive to light.  Cardiovascular:     Rate and Rhythm: Normal rate.     Pulses: Normal pulses.  Pulmonary:     Effort: Pulmonary effort is normal.  Abdominal:     Tenderness: There is no right CVA tenderness or left CVA tenderness.  Musculoskeletal:        General: No swelling.     Cervical back: No rigidity.  Skin:    Findings: No bruising or erythema.  Neurological:     Mental Status: She is alert and oriented to person, place, and time.     GCS: GCS eye subscore is 4. GCS verbal subscore is 5. GCS motor subscore is 6.     Sensory: Sensation is intact.     Motor: No atrophy.     Coordination: Finger-Nose-Finger Test normal.     Gait: Gait normal.      No results found for any visits on 07/01/22. Last CBC Lab Results  Component Value Date   WBC 3.7 (L) 02/19/2022   HGB 11.9 (L) 02/19/2022   HCT 35.4 (L) 02/19/2022   MCV 97.8 02/19/2022   MCH 32.9 02/19/2022   RDW 16.3 (H) 02/19/2022   PLT 181 41/32/4401   Last metabolic panel Lab Results  Component Value Date   GLUCOSE 81 05/14/2022   NA 141 05/14/2022   K 4.4 05/14/2022   CL 98 05/14/2022   CO2 24 05/14/2022   BUN 14 05/14/2022   CREATININE 0.90 05/14/2022   EGFR 65 05/14/2022   CALCIUM 9.8 05/14/2022   PHOS 2.8 02/19/2022   PROT 6.1 (L) 02/19/2022   ALBUMIN 3.7 02/19/2022   BILITOT 0.7 02/19/2022   ALKPHOS 57 02/19/2022   AST 19 02/19/2022   ALT 16 02/19/2022   ANIONGAP 9 02/19/2022   Last lipids Lab Results  Component Value Date   CHOL 143 05/14/2022   HDL 87 05/14/2022   LDLCALC 40 05/14/2022   TRIG 82 05/14/2022   CHOLHDL 1.6 05/14/2022   Last hemoglobin A1c No results found for: "HGBA1C" Last thyroid functions Lab Results  Component Value Date   TSH 2.80 03/30/2020    Last vitamin D Lab Results  Component Value Date   VD25OH 44 04/15/2013   Last vitamin B12 and Folate Lab Results  Component Value Date   VITAMINB12 489 03/30/2020        Assessment & Plan:    Routine Health Maintenance and Physical Exam  Immunization History  Administered Date(s) Administered   Influenza,inj,Quad PF,6+ Mos 08/15/2013, 05/28/2015, 05/16/2016, 05/22/2017   Influenza-Unspecified 06/12/2015   Moderna Sars-Covid-2 Vaccination 09/29/2019, 10/28/2019, 07/25/2020   PPD Test 01/08/2016   Pneumococcal Conjugate-13 05/28/2015   Pneumococcal Polysaccharide-23 10/06/2011   Tdap 01/08/2016    Health Maintenance  Topic Date Due   Zoster Vaccines- Shingrix (1 of 2) Never done  Medicare Annual Wellness (AWV)  02/18/2023   TETANUS/TDAP  01/07/2026   Pneumonia Vaccine 62+ Years old  Completed   DEXA SCAN  Completed   Hepatitis C Screening  Completed   HPV VACCINES  Aged Out   INFLUENZA VACCINE  Discontinued   COLONOSCOPY (Pts 45-59yr Insurance coverage will need to be confirmed)  Discontinued   COVID-19 Vaccine  Discontinued    Discussed health benefits of physical activity, and encouraged her to engage in regular exercise appropriate for her age and condition.  Problem List Items Addressed This Visit       Nervous and Auditory   Seizure disorder ([facility]    Encourage patient to continue taking Keppra 500 mg twice daily Referral placed to neurology for further assessment of patient's seizures         Other   Fecal incontinence    GI stool study was ordered to assess the underlying reason for the patient's incontinence Encouraged to continue high-fiber diet Encouraged to perform anal splinter exercises Will follow-up in 1 week       Encounter for annual general medical examination with abnormal findings in adult - Primary    Physical exam as documented Counseling is done on healthy lifestyle involving commitment to 150 minutes of exercise per week,   Discussed heart-healthy diet  Changes in health habits are decided on by the patient with goals and time frames set for achieving them. Immunization and cancer screening needs are specifically addressed at this visit          Other Visit Diagnoses     [place]       Relevant Orders   Ambulatory referral to Neurology   Diarrhea, unspecified type       Relevant Orders   Fecal occult blood, imunochemical   Cdiff NAA+O+P+Stool Culture      Return in about 1 week (around 07/08/2022) for diarrhea.     GAlvira Monday FNP

## 2022-07-02 DIAGNOSIS — Z0001 Encounter for general adult medical examination with abnormal findings: Secondary | ICD-10-CM | POA: Insufficient documentation

## 2022-07-02 DIAGNOSIS — G40909 Epilepsy, unspecified, not intractable, without status epilepticus: Secondary | ICD-10-CM | POA: Insufficient documentation

## 2022-07-02 DIAGNOSIS — R159 Full incontinence of feces: Secondary | ICD-10-CM | POA: Insufficient documentation

## 2022-07-02 NOTE — Assessment & Plan Note (Addendum)
GI stool study was ordered to assess the underlying reason for the patient's incontinence Encouraged to continue high-fiber diet Encouraged to perform anal splinter exercises Will follow-up in 1 week

## 2022-07-02 NOTE — Assessment & Plan Note (Signed)
Encourage patient to continue taking Keppra 500 mg twice daily Referral placed to neurology for further assessment of patient's seizures

## 2022-07-02 NOTE — Assessment & Plan Note (Signed)
Physical exam as documented Counseling is done on healthy lifestyle involving commitment to 150 minutes of exercise per week,  Discussed heart-healthy diet  Changes in health habits are decided on by the patient with goals and time frames set for achieving them. Immunization and cancer screening needs are specifically addressed at this visit

## 2022-07-10 ENCOUNTER — Ambulatory Visit (INDEPENDENT_AMBULATORY_CARE_PROVIDER_SITE_OTHER): Payer: Medicare PPO | Admitting: Family Medicine

## 2022-07-10 ENCOUNTER — Encounter: Payer: Self-pay | Admitting: Family Medicine

## 2022-07-10 VITALS — BP 132/68 | HR 64 | Ht 66.5 in | Wt 171.0 lb

## 2022-07-10 DIAGNOSIS — R4189 Other symptoms and signs involving cognitive functions and awareness: Secondary | ICD-10-CM

## 2022-07-10 DIAGNOSIS — R159 Full incontinence of feces: Secondary | ICD-10-CM | POA: Diagnosis not present

## 2022-07-10 MED ORDER — DONEPEZIL HCL 5 MG PO TABS: 5.0000 mg | ORAL_TABLET | Freq: Every day | ORAL | 1 refills | Status: DC

## 2022-07-10 NOTE — Progress Notes (Signed)
Established Patient Office Visit  Subjective:  Patient ID: Molly Patel, female    DOB: [birth year]  Age: 76 y.o. MRN: [id number]  CC:  Chief Complaint  Patient presents with   Follow-up    1 weeks f/u, pt states diarrhea sx have resolved, didn't feel the need to return the samples back since problem got better.     HPI Molly Patel is a 76 y.o. female with past medical history of epilepsy, fecal incontinence presents for f/u of  chronic medical conditions.  Fecal incontinence: She reports that her diarrhea symptoms have resolved.  She voices no complaints or concerns today.  Past Medical History:  Diagnosis Date   Angioedema    B-cell lymphoma ([place])    Depression    Dizziness    Echocardiogram with ECG monitoring 02/23/2011   mild LVH, normal event monitor    Hyperlipidemia    Hypertension    takes meds daily   Incontinence    Insomnia    Ovarian cyst    Port-A-Cath in place 10/29/2021   Seizures ([place])    2 weeks ago   Stroke [facility]) 08/25/2014   Syncope    Vitamin D deficiency     Past Surgical History:  Procedure Laterality Date   ABDOMINAL HYSTERECTOMY     COLONOSCOPY N/A 11/02/2014   Procedure: COLONOSCOPY;  Surgeon: Rogene Houston, MD;  [facility];  Service: Endoscopy;  Laterality: N/A;  140 - moved to 12:55 - Ann to notify pt   CRANIOTOMY  06/29/2012   Procedure: CRANIOTOMY TUMOR EXCISION;  Surgeon: Faythe Ghee, MD;  Location: [facility];  Service: Neurosurgery;  Laterality: Left;  Left frontal Craniotomy for Meningioma   ESOPHAGEAL DILATION N/A 11/02/2014   Procedure: ESOPHAGEAL DILATION;  Surgeon: Rogene Houston, MD;  [facility];  Service: Endoscopy;  Laterality: N/A;   ESOPHAGOGASTRODUODENOSCOPY N/A 11/02/2014   Procedure: ESOPHAGOGASTRODUODENOSCOPY (EGD);  Surgeon: Rogene Houston, MD;  [facility];  Service: Endoscopy;  Laterality: N/A;   PORT-A-CATH REMOVAL N/A 06/12/2022   Procedure: MINOR REMOVAL  PORT-A-CATH;  Surgeon: Aviva Signs, MD;  Location[facility];  Service: General;  Laterality: N/A;   PORTACATH PLACEMENT Left 10/18/2021   Procedure: INSERTION PORT-A-CATH;  Surgeon: Aviva Signs, MD;  Location: [facility];  Service: General;  Laterality: Left;   VESICOVAGINAL FISTULA CLOSURE W/ TAH      Family History  Problem Relation Age of Onset   Stroke Father    Asthma Grandchild    Arthritis Sister    Diabetes Sister    Colon cancer Brother     Social History   Socioeconomic History   Marital status: Divorced    Spouse name: Not on file   Number of children: 2   Years of education: Not on file   Highest education level: Not on file  Occupational History   Occupation: Retired    Comment: Administrator, 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  Tobacco Use   Smoking status: Former    Years: 7.00    Types: Cigarettes    Quit date: 08/25/1985    Years since quitting: 36.8   Smokeless tobacco: Never  Vaping Use   Vaping Use: Never used  Substance and Sexual Activity   Alcohol use: No   Drug use: No   Sexual activity: Not on file  Other Topics Concern   Not on file  Social History Narrative   Not on file   Social Determinants of Health  Financial Resource Strain: Low Risk  (06/24/2022)   Overall Financial Resource Strain (CARDIA)    Difficulty of Paying Living Expenses: Not very hard  Food Insecurity: No Food Insecurity (02/17/2022)   Hunger Vital Sign    Worried About Running Out of Food in the Last Year: Never true    Ran Out of Food in the Last Year: Never true  Transportation Needs: No Transportation Needs (06/24/2022)   PRAPARE - Hydrologist (Medical): No    Lack of Transportation (Non-Medical): No  Physical Activity: Sufficiently Active (02/17/2022)   Exercise Vital Sign    Days of Exercise per Week: 3 days    Minutes of Exercise per Session: 50 min  Stress: Not on file  Social Connections: Not on file  Intimate Partner Violence: Not on file    Outpatient  Medications Prior to Visit  Medication Sig Dispense Refill   allopurinol (ZYLOPRIM) 300 MG tablet TAKE 1 TABLET BY MOUTH EVERY DAY 30 tablet 2   aspirin EC 325 MG tablet Take 1 tablet (325 mg total) by mouth daily. 30 tablet 0   atenolol (TENORMIN) 50 MG tablet Take 1 tablet (50 mg total) by mouth daily. 90 tablet 2   cholecalciferol (VITAMIN D) 25 MCG (1000 UNIT) tablet TAKE 1 TABLET EVERY DAY 90 tablet 3   hydrochlorothiazide (MICROZIDE) 12.5 MG capsule Take 1 capsule (12.5 mg total) by mouth daily. 90 capsule 0   Lactulose 20 GM/30ML SOLN Take 30 mLs (20 g total) by mouth 3 (three) times daily as needed. 450 mL 0   levETIRAcetam (KEPPRA) 500 MG tablet Take 1 tablet (500 mg total) by mouth 2 (two) times daily. Needs to establish with new PCP.  Dr. Buelah Manis is no longer at this office.  Must be seen before more refills. 60 tablet 0   lidocaine-prilocaine (EMLA) cream Apply a small amount to port a cath site and cover with plastic wrap 1 hour prior to infusion appointments 30 g 3   memantine (NAMENDA) 5 MG tablet Take 5 mg by mouth in the morning.     predniSONE (DELTASONE) 20 MG tablet Take 5 tablets (100 mg) by mouth daily for 5 days starting on the first day of each treatment.  DO NOT take on the weeks you are not receiving chemotherapy treatment. 30 tablet 0   prochlorperazine (COMPAZINE) 10 MG tablet Take 1 tablet (10 mg total) by mouth every 6 (six) hours as needed (Nausea or vomiting). 30 tablet 6   RESTASIS 0.05 % ophthalmic emulsion Place 1 drop into both eyes 2 (two) times daily as needed (dry/irritated eyes).     rosuvastatin (CRESTOR) 40 MG tablet Take 1 tablet (40 mg total) by mouth daily. (Patient taking differently: Take 20 mg by mouth in the morning and at bedtime.) 90 tablet 1   traZODone (DESYREL) 50 MG tablet Take 0.5-1 tablets (25-50 mg total) by mouth at bedtime as needed for sleep. 30 tablet 0   donepezil (ARICEPT) 5 MG tablet Take 1 tablet (5 mg total) by mouth at bedtime. 90  tablet 1   No facility-administered medications prior to visit.    No Known Allergies  ROS Review of Systems  Constitutional:  Negative for fatigue and fever.  Eyes:  Negative for visual disturbance.  Respiratory:  Negative for chest tightness and shortness of breath.   Cardiovascular:  Negative for chest pain and palpitations.  Gastrointestinal:  Negative for diarrhea, nausea and vomiting.  Neurological:  Negative for dizziness  and headaches.      Objective:    Physical Exam HENT:     Head: Normocephalic.     Right Ear: External ear normal.     Left Ear: External ear normal.     Nose: No congestion.  Cardiovascular:     Rate and Rhythm: Normal rate and regular rhythm.     Pulses: Normal pulses.     Heart sounds: Normal heart sounds.  Pulmonary:     Effort: Pulmonary effort is normal.     Breath sounds: Normal breath sounds.  Musculoskeletal:     Cervical back: No rigidity.  Neurological:     Mental Status: She is alert.     BP 132/68   Pulse 64   Ht 5' 6.5" (1.689 m)   Wt 171 lb 0.6 oz (77.6 kg)   SpO2 100%   BMI 27.19 kg/m  Wt Readings from Last 3 Encounters:  07/10/22 171 lb 0.6 oz (77.6 kg)  07/01/22 165 lb 0.6 oz (74.9 kg)  05/27/22 168 lb (76.2 kg)    Lab Results  Component Value Date   TSH 2.80 03/30/2020   Lab Results  Component Value Date   WBC 3.7 (L) 02/19/2022   HGB 11.9 (L) 02/19/2022   HCT 35.4 (L) 02/19/2022   MCV 97.8 02/19/2022   PLT 181 02/19/2022   Lab Results  Component Value Date   NA 141 05/14/2022   K 4.4 05/14/2022   CO2 24 05/14/2022   GLUCOSE 81 05/14/2022   BUN 14 05/14/2022   CREATININE 0.90 05/14/2022   BILITOT 0.7 02/19/2022   ALKPHOS 57 02/19/2022   AST 19 02/19/2022   ALT 16 02/19/2022   PROT 6.1 (L) 02/19/2022   ALBUMIN 3.7 02/19/2022   CALCIUM 9.8 05/14/2022   ANIONGAP 9 02/19/2022   EGFR 65 05/14/2022   Lab Results  Component Value Date   CHOL 143 05/14/2022   Lab Results  Component Value Date    HDL 87 05/14/2022   Lab Results  Component Value Date   LDLCALC 40 05/14/2022   Lab Results  Component Value Date   TRIG 82 05/14/2022   Lab Results  Component Value Date   CHOLHDL 1.6 05/14/2022   No results found for: "HGBA1C"    Assessment & Plan:  Incontinence of feces, unspecified fecal incontinence type Assessment & Plan: Resolved No complaints or concerns today   Cognitive changes -     Donepezil HCl; Take 1 tablet (5 mg total) by mouth at bedtime.  Dispense: 90 tablet; Refill: 1    Follow-up: Return in about 4 months (around 11/08/2022).   Alvira Monday, FNP

## 2022-07-10 NOTE — Patient Instructions (Signed)
I appreciate the opportunity to provide care to you today!    Follow up:  4 months  Please pick up your prescription at the pharmacy   Please continue to a heart-healthy diet and increase your physical activities. Try to exercise for 42mns at least three times a week.      It was a pleasure to see you and I look forward to continuing to work together on your health and well-being. Please do not hesitate to call the office if you need care or have questions about your care.   Have a wonderful day and week. With Gratitude, GAlvira MondayMSN, FNP-BC

## 2022-07-10 NOTE — Assessment & Plan Note (Signed)
Resolved No complaints or concerns today

## 2022-08-01 ENCOUNTER — Ambulatory Visit (INDEPENDENT_AMBULATORY_CARE_PROVIDER_SITE_OTHER): Payer: Medicare PPO | Admitting: Neurology

## 2022-08-01 ENCOUNTER — Encounter: Payer: Self-pay | Admitting: Neurology

## 2022-08-01 VITALS — BP 170/72 | HR 56 | Ht 66.5 in | Wt 173.0 lb

## 2022-08-01 DIAGNOSIS — R55 Syncope and collapse: Secondary | ICD-10-CM

## 2022-08-01 NOTE — Patient Instructions (Signed)
Routine EEG, I will contact you to go over the result Continue current medications Continue to follow PCP and return as needed

## 2022-08-01 NOTE — Progress Notes (Signed)
[facility]  PATIENT: Molly Patel DOB: March 22, 1946  REQUESTING CLINICIAN: Alvira Monday, FNP HISTORY FROM: Patient  REASON FOR VISIT: Syncope vs. seizure   HISTORICAL  CHIEF COMPLAINT:  Chief Complaint  Patient presents with   New Patient (Initial Visit)    Rm 12 alone. Here for consult on possible seizure activity.     HISTORY OF PRESENT ILLNESS:  This is a 76 year old woman past medical history of left frontal meningioma s/p resection in 2013, cognitive impairment, history of lung cancer status post chemotherapy and hyperlipidemia who is presenting after seizure versus syncope.  Patient reports since 2013 she has history of recurrent syncope. She presented to the hospital back in 2013 after a syncopal episode and was found to have a left frontal meningioma which was resected.  After resection she was started on Keppra 500 mg twice daily.  She reported Keppra was started prophylactically and she did not have any seizure or seizure-like activity.  She is continue on Keppra for the past 10 years.  Denies any side effect from the medication.  She reported last year in December she was diagnosed with lung cancer she started on chemotherapy.  She reports a week after each round of  chemotherapy she will feel tired and lethargic and have a syncopal episode.  The last chemotherapy was in June of this year and that was her last syncopal episode.  She has not had any chemotherapy but thinks that she may have another syncopal episode during this time but not totally sure. On October 26 she reported driving her car, pulling the parking lot and the next thing that she remembers is EMS knocking at the car door.  She denies any confusion at that time and did not want to go to the hospital but after a while ended up going to the hospital.  She reports her bystander had called EMS because she was unresponsive in her car.  She denies any tongue biting, did not denies any urinary  incontinence and denies any abnormal movements.    OTHER MEDICAL CONDITIONS: History of left frontal meningioma s/p resection in 2013, history of lung cancer status postchemotherapy, cognitive impairment/dementia, hyperlipidemia, hypertension  REVIEW OF SYSTEMS: Full 14 system review of systems performed and negative with exception of: As noted in the HPI  ALLERGIES: No Known Allergies  HOME MEDICATIONS: Outpatient Medications Prior to Visit  Medication Sig Dispense Refill   aspirin EC 325 MG tablet Take 1 tablet (325 mg total) by mouth daily. 30 tablet 0   atenolol (TENORMIN) 50 MG tablet Take 1 tablet (50 mg total) by mouth daily. 90 tablet 2   cholecalciferol (VITAMIN D) 25 MCG (1000 UNIT) tablet TAKE 1 TABLET EVERY DAY 90 tablet 3   donepezil (ARICEPT) 5 MG tablet Take 1 tablet (5 mg total) by mouth at bedtime. 90 tablet 1   hydrochlorothiazide (MICROZIDE) 12.5 MG capsule Take 1 capsule (12.5 mg total) by mouth daily. 90 capsule 0   levETIRAcetam (KEPPRA) 500 MG tablet Take 1 tablet (500 mg total) by mouth 2 (two) times daily. Needs to establish with new PCP.  Dr. Buelah Manis is no longer at this office.  Must be seen before more refills. 60 tablet 0   memantine (NAMENDA) 5 MG tablet Take 5 mg by mouth in the morning.     RESTASIS 0.05 % ophthalmic emulsion Place 1 drop into both eyes 2 (two) times daily as needed (dry/irritated eyes).     rosuvastatin (CRESTOR) 40 MG tablet  Take 1 tablet (40 mg total) by mouth daily. (Patient taking differently: Take 20 mg by mouth in the morning and at bedtime.) 90 tablet 1   allopurinol (ZYLOPRIM) 300 MG tablet TAKE 1 TABLET BY MOUTH EVERY DAY 30 tablet 2   Lactulose 20 GM/30ML SOLN Take 30 mLs (20 g total) by mouth 3 (three) times daily as needed. 450 mL 0   lidocaine-prilocaine (EMLA) cream Apply a small amount to port a cath site and cover with plastic wrap 1 hour prior to infusion appointments 30 g 3   predniSONE (DELTASONE) 20 MG tablet Take 5  tablets (100 mg) by mouth daily for 5 days starting on the first day of each treatment.  DO NOT take on the weeks you are not receiving chemotherapy treatment. 30 tablet 0   prochlorperazine (COMPAZINE) 10 MG tablet Take 1 tablet (10 mg total) by mouth every 6 (six) hours as needed (Nausea or vomiting). 30 tablet 6   traZODone (DESYREL) 50 MG tablet Take 0.5-1 tablets (25-50 mg total) by mouth at bedtime as needed for sleep. 30 tablet 0   No facility-administered medications prior to visit.    PAST MEDICAL HISTORY: Past Medical History:  Diagnosis Date   Angioedema    B-cell lymphoma ([place])    Depression    Dizziness    Echocardiogram with ECG monitoring 02/23/2011   mild LVH, normal event monitor    Hyperlipidemia    Hypertension    takes meds daily   Incontinence    Insomnia    Ovarian cyst    Port-A-Cath in place 10/29/2021   Seizures ([place])    2 weeks ago   Stroke ([place]) 08/25/2014   Syncope    Vitamin D deficiency     PAST SURGICAL HISTORY: Past Surgical History:  Procedure Laterality Date   ABDOMINAL HYSTERECTOMY     COLONOSCOPY N/A 11/02/2014   Procedure: COLONOSCOPY;  Surgeon: Rogene Houston, MD;  [facility]: [facility];  Service: Endoscopy;  Laterality: N/A;  140 - moved to 12:55 - Ann to notify pt   CRANIOTOMY  06/29/2012   Procedure: CRANIOTOMY TUMOR EXCISION;  Surgeon: Faythe Ghee, MD;  Location: [facility];  Service: Neurosurgery;  Laterality: Left;  Left frontal Craniotomy for Meningioma   ESOPHAGEAL DILATION N/A 11/02/2014   Procedure: ESOPHAGEAL DILATION;  Surgeon: Rogene Houston, MD;  [facility]: [facility];  Service: Endoscopy;  Laterality: N/A;   ESOPHAGOGASTRODUODENOSCOPY N/A 11/02/2014   Procedure: ESOPHAGOGASTRODUODENOSCOPY (EGD);  Surgeon: Rogene Houston, MD;  [facility]: [facility];  Service: Endoscopy;  Laterality: N/A;   PORT-A-CATH REMOVAL N/A 06/12/2022   Procedure: MINOR REMOVAL PORT-A-CATH;  Surgeon: Aviva Signs, MD;  Location: [facility];   Service: General;  Laterality: N/A;   PORTACATH PLACEMENT Left 10/18/2021   Procedure: INSERTION PORT-A-CATH;  Surgeon: Aviva Signs, MD;  Location: [facility];  Service: General;  Laterality: Left;   VESICOVAGINAL FISTULA CLOSURE W/ TAH      FAMILY HISTORY: Family History  Problem Relation Age of Onset   Stroke Father    Asthma Grandchild    Arthritis Sister    Diabetes Sister    Colon cancer Brother     SOCIAL HISTORY: Social History   Socioeconomic History   Marital status: Divorced    Spouse name: Not on file   Number of children: 2   Years of education: Not on file   Highest education level: Not on file  Occupational History   Occupation: Retired  Comment: [employer]  Tobacco Use   Smoking status: Former    Years: 7.00    Types: Cigarettes    Quit date: 08/25/1985    Years since quitting: 36.9   Smokeless tobacco: Never  Vaping Use   Vaping Use: Never used  Substance and Sexual Activity   Alcohol use: No   Drug use: No   Sexual activity: Not on file  Other Topics Concern   Not on file  Social History Narrative   Not on file   Social Determinants of Health   Financial Resource Strain: Low Risk  (06/24/2022)   Overall Financial Resource Strain (CARDIA)    Difficulty of Paying Living Expenses: Not very hard  Food Insecurity: No Food Insecurity (02/17/2022)   Hunger Vital Sign    Worried About Running Out of Food in the Last Year: Never true    Ran Out of Food in the Last Year: Never true  Transportation Needs: No Transportation Needs (06/24/2022)   PRAPARE - Hydrologist (Medical): No    Lack of Transportation (Non-Medical): No  Physical Activity: Sufficiently Active (02/17/2022)   Exercise Vital Sign    Days of Exercise per Week: 3 days    Minutes of Exercise per Session: 50 min  Stress: Not on file  Social Connections: Not on file  Intimate Partner Violence: Not on file    PHYSICAL EXAM  GENERAL  EXAM/CONSTITUTIONAL: Vitals:  Vitals:   08/01/22 0902  BP: (!) 170/72  Pulse: (!) 56  Weight: 173 lb (78.5 kg)  Height: 5' 6.5" (1.689 m)   Body mass index is 27.5 kg/m. Wt Readings from Last 3 Encounters:  08/01/22 173 lb (78.5 kg)  07/10/22 171 lb 0.6 oz (77.6 kg)  07/01/22 165 lb 0.6 oz (74.9 kg)   Patient is in no distress; well developed, nourished and groomed; neck is supple  EYES: Visual fields full to confrontation, Extraocular movements intacts,  No results found.  MUSCULOSKELETAL: Gait, strength, tone, movements noted in Neurologic exam below  NEUROLOGIC: MENTAL STATUS:      No data to display         awake, alert, oriented to person, place and time recent and remote memory intact normal attention and concentration language fluent, comprehension intact, naming intact fund of knowledge appropriate  CRANIAL NERVE:  2nd, 3rd, 4th, 6th - Visual fields full to confrontation, extraocular muscles intact, no nystagmus 5th - facial sensation symmetric 7th - facial strength symmetric 8th - hearing intact 9th - palate elevates symmetrically, uvula midline 11th - shoulder shrug symmetric 12th - tongue protrusion midline  MOTOR:  normal bulk and tone, full strength in the BUE, BLE  SENSORY:  normal and symmetric to light touch  COORDINATION:  finger-nose-finger, fine finger movements normal  REFLEXES:  deep tendon reflexes present and symmetric  GAIT/STATION:  normal   DIAGNOSTIC DATA (LABS, IMAGING, TESTING) - I reviewed patient records, labs, notes, testing and imaging myself where available.  Lab Results  Component Value Date   WBC 3.7 (L) 02/19/2022   HGB 11.9 (L) 02/19/2022   HCT 35.4 (L) 02/19/2022   MCV 97.8 02/19/2022   PLT 181 02/19/2022      Component Value Date/Time   NA 141 05/14/2022 1156   K 4.4 05/14/2022 1156   CL 98 05/14/2022 1156   CO2 24 05/14/2022 1156   GLUCOSE 81 05/14/2022 1156   GLUCOSE 111 (H) 02/19/2022 0813    BUN 14 05/14/2022 1156  CREATININE 0.90 05/14/2022 1156   CREATININE 0.90 10/03/2020 0852   CALCIUM 9.8 05/14/2022 1156   PROT 6.1 (L) 02/19/2022 0813   ALBUMIN 3.7 02/19/2022 0813   AST 19 02/19/2022 0813   ALT 16 02/19/2022 0813   ALKPHOS 57 02/19/2022 0813   BILITOT 0.7 02/19/2022 0813   GFRNONAA >60 02/19/2022 0813   GFRNONAA 75 04/15/2013 0956   GFRAA >60 05/30/2019 1252   GFRAA 87 04/15/2013 0956   Lab Results  Component Value Date   CHOL 143 05/14/2022   HDL 87 05/14/2022   LDLCALC 40 05/14/2022   TRIG 82 05/14/2022   No results found for: "HGBA1C" Lab Results  Component Value Date   VITAMINB12 489 03/30/2020   Lab Results  Component Value Date   TSH 2.80 03/30/2020    MRI Brain 10/16/2021 1. Postsurgical changes from resection of left frontal extra-axial lesion without evidence of residual/recurrent tumor. 2. Mild interval progression of chronic white matter disease. 3. Innumerable foci of susceptibility artifact along the cerebral and cerebellar sulci, consistent with prior microhemorrhages, suspicious for underlying amyloid angiopathy. Although this appear more prominent than on prior MRI, this could be at least in part related to technique.  MRI Brain 06/20/2022 1. No acute intracranial pathology or significant change compared to  the prior brain MRI from 10/16/2021.  2. Unchanged encephalomalacia in the left anterior frontal lobe.  3. Numerous foci of susceptibility artifact at the periphery of both  cerebral hemispheres, again suggesting cerebral amyloid angiopathy   I personally reviewed brain Images and previous EEG reports.   ASSESSMENT AND PLAN  76 y.o. year old female  with history of left frontal meningioma s/p resection in 2013, cognitive impairment, history of lung cancer status post chemotherapy, history of recurrent syncope, hypertension and hyperlipidemia who is presenting after another event concerning for syncope versus seizure.  She was in a  car, parked in a parking lot and was unresponsive until EMS arrived.  There was no reported tongue biting, no urinary incontinence and no abnormal movements.  She denies any previous history of seizures in the past even though she had a left frontal meningioma resected and is on Keppra 500 mg twice daily. (Prophylaxis Keppra) Plan will be to obtain a routine EEG and based on the abnormalities, if any, we will contact patient to discuss future plans.  I do suspect this was a syncopal episode but we have to rule out seizures.  Continue to follow with PCP and return as needed.   1. Syncope, unspecified syncope type     Patient Instructions  Routine EEG, I will contact you to go over the result Continue current medications Continue to follow PCP and return as needed   Per [facility] statutes, patients with seizures are not allowed to drive until they have been seizure-free for six months.  Other recommendations include using caution when using heavy equipment or power tools. Avoid working on ladders or at heights. Take showers instead of baths.  Do not swim alone.  Ensure the water temperature is not too high on the home water heater. Do not go swimming alone. Do not lock yourself in a room alone (i.e. bathroom). When caring for infants or small children, sit down when holding, feeding, or changing them to minimize risk of injury to the child in the event you have a seizure. Maintain good sleep hygiene. Avoid alcohol.  Also recommend adequate sleep, hydration, good diet and minimize stress.   During the Seizure  -  First, ensure adequate ventilation and place patients on the floor on their left side  Loosen clothing around the neck and ensure the airway is patent. If the patient is clenching the teeth, do not force the mouth open with any object as this can cause severe damage - Remove all items from the surrounding that can be hazardous. The patient may be oblivious to what's happening and  may not even know what he or she is doing. If the patient is confused and wandering, either gently guide him/her away and block access to outside areas - Reassure the individual and be comforting - Call 911. In most cases, the seizure ends before EMS arrives. However, there are cases when seizures may last over 3 to 5 minutes. Or the individual may have developed breathing difficulties or severe injuries. If a pregnant patient or a person with diabetes develops a seizure, it is prudent to call an ambulance. - Finally, if the patient does not regain full consciousness, then call EMS. Most patients will remain confused for about 45 to 90 minutes after a seizure, so you must use judgment in calling for help. - Avoid restraints but make sure the patient is in a bed with padded side rails - Place the individual in a lateral position with the neck slightly flexed; this will help the saliva drain from the mouth and prevent the tongue from falling backward - Remove all nearby furniture and other hazards from the area - Provide verbal assurance as the individual is regaining consciousness - Provide the patient with privacy if possible - Call for help and start treatment as ordered by the caregiver   After the Seizure (Postictal Stage)  After a seizure, most patients experience confusion, fatigue, muscle pain and/or a headache. Thus, one should permit the individual to sleep. For the next few days, reassurance is essential. Being calm and helping reorient the person is also of importance.  Most seizures are painless and end spontaneously. Seizures are not harmful to others but can lead to complications such as stress on the lungs, brain and the heart. Individuals with prior lung problems may develop labored breathing and respiratory distress.     Orders Placed This Encounter  Procedures   EEG adult    No orders of the defined types were placed in this encounter.   Return if symptoms worsen or fail  to improve.  I have spent a total of 45 minutes dedicated to this patient today, preparing to see patient, performing a medically appropriate examination and evaluation, ordering tests and/or medications and procedures, and counseling and educating the patient/family/caregiver; independently interpreting result and communicating results to the family/patient/caregiver; and documenting clinical information in the electronic medical record.   Alric Ran, MD 08/01/2022, 11:33 AM  [facility] [address] [place], [place] [address] [phone]

## 2022-08-22 ENCOUNTER — Other Ambulatory Visit: Payer: Self-pay | Admitting: Nurse Practitioner

## 2022-09-01 ENCOUNTER — Other Ambulatory Visit: Payer: Self-pay

## 2022-09-02 ENCOUNTER — Ambulatory Visit: Payer: Medicare PPO | Admitting: Neurology

## 2022-09-02 DIAGNOSIS — R55 Syncope and collapse: Secondary | ICD-10-CM | POA: Diagnosis not present

## 2022-09-02 NOTE — Procedures (Signed)
    History:  77 year old woman with syncope vs. seizure  EEG classification: Awake and drowsy  Description of the recording: The background rhythms of this recording consists of a fairly well modulated medium amplitude tetha rhythm of 7-8 Hz at best that is reactive to eye opening and closure. Present in the anterior head region is a 15-20 Hz beta activity. Photic stimulation was performed, did not show any abnormalities. Hyperventilation was also performed, did not show any abnormalities. Drowsiness was manifested by background fragmentation. No abnormal epileptiform discharges seen during this recording. There was no focal slowing. There were no electrographic seizure identified.   Abnormality: Mild diffuse slowing   Impression: This is an abnormal EEG recorded while drowsy and awake due to mild diffuse slowing. This is consistent with mild generalized brain dysfunction, such as encephalopathy, nonspecific.    Alric Ran, MD [facility]

## 2022-09-03 ENCOUNTER — Encounter: Payer: Self-pay | Admitting: Hematology

## 2022-09-04 ENCOUNTER — Telehealth: Payer: Self-pay | Admitting: Neurology

## 2022-09-04 NOTE — Telephone Encounter (Signed)
I called and spoke to patient about the results that Dr. April Manson placed and Pt verbalized understanding. Pt had no questions at this time but was encouraged to call back if questions arise.

## 2022-09-04 NOTE — Telephone Encounter (Signed)
Pt is calling. Requesting a call back to go over EEG results.

## 2022-09-04 NOTE — Progress Notes (Signed)
Please call and inform patient that her recent EEG (Brain wave test) showed mild diffuse slowing. This finding is nonspecific but could be seen in patient with mild brain dysfunction.  No further action is required on this test at this time. Please keep any upcoming appointments or tests and  call us with any interim questions, concerns, problems or updates. Thanks,   Alric Ran, MD

## 2022-09-08 ENCOUNTER — Other Ambulatory Visit: Payer: Self-pay

## 2022-09-09 ENCOUNTER — Other Ambulatory Visit: Payer: Self-pay

## 2022-09-09 ENCOUNTER — Telehealth: Payer: Self-pay

## 2022-09-09 NOTE — Telephone Encounter (Signed)
This medication has not been refilled since 2022.  Please ask the patient to schedule an appointment if a refill is needed

## 2022-09-09 NOTE — Telephone Encounter (Signed)
Pharmacy requesting a refill on levatiracetam 500 mg tablet, you have never filled this please advice?

## 2022-09-09 NOTE — Telephone Encounter (Signed)
LVM, for pt to call and schedule an appt either in office or video appt for refill of this medication.

## 2022-09-10 ENCOUNTER — Encounter: Payer: Self-pay | Admitting: Family Medicine

## 2022-09-10 ENCOUNTER — Other Ambulatory Visit: Payer: Self-pay

## 2022-09-10 ENCOUNTER — Telehealth (INDEPENDENT_AMBULATORY_CARE_PROVIDER_SITE_OTHER): Payer: Medicare PPO | Admitting: Family Medicine

## 2022-09-10 DIAGNOSIS — G40909 Epilepsy, unspecified, not intractable, without status epilepticus: Secondary | ICD-10-CM | POA: Diagnosis not present

## 2022-09-10 MED ORDER — LEVETIRACETAM 500 MG PO TABS: 500.0000 mg | ORAL_TABLET | Freq: Two times a day (BID) | ORAL | 2 refills | Status: DC

## 2022-09-10 NOTE — Telephone Encounter (Signed)
Spoke to pt added her in for appt at 11am.

## 2022-09-10 NOTE — Telephone Encounter (Signed)
Patient returning call.

## 2022-09-10 NOTE — Progress Notes (Signed)
   Virtual Visit via Video Note  I connected with Donalynn Furlong on 09/10/22 at 11:00 AM EST by a video enabled telemedicine application and verified that I am speaking with the correct person using two identifiers.  Location: Patient: Home Provider: Clinic  I discussed the limitations, risks, security, and privacy concerns of performing an evaluation and management service by video and the availability of in person appointments. I also discussed with the patient that there may be a patient responsible charge related to this service. The patient expressed understanding and agreed to proceed.  Subjective: PCP: Alvira Monday, FNP  Chief Complaint  Patient presents with   Medication Refill    Pt needs refill on keppra, hx shows hasn't had a refill since 2022.    HPI The patient is in today reporting refill of her Keppra. History of seizure with last episode on 07/14/2022.  She was started on Keppra since her brain surgery in 2013.   ROS: Per HPI  Current Outpatient Medications:    aspirin EC 325 MG tablet, Take 1 tablet (325 mg total) by mouth daily., Disp: 30 tablet, Rfl: 0   atenolol (TENORMIN) 50 MG tablet, Take 1 tablet (50 mg total) by mouth daily., Disp: 90 tablet, Rfl: 2   cholecalciferol (VITAMIN D) 25 MCG (1000 UNIT) tablet, TAKE 1 TABLET EVERY DAY, Disp: 90 tablet, Rfl: 3   donepezil (ARICEPT) 5 MG tablet, Take 1 tablet (5 mg total) by mouth at bedtime., Disp: 90 tablet, Rfl: 1   hydrochlorothiazide (MICROZIDE) 12.5 MG capsule, TAKE 1 CAPSULE EVERY DAY (NO FURTHER REFILLS, MUST ESTABLISH WITH NEW PCP), Disp: 90 capsule, Rfl: 3   memantine (NAMENDA) 5 MG tablet, Take 5 mg by mouth in the morning., Disp: , Rfl:    RESTASIS 0.05 % ophthalmic emulsion, Place 1 drop into both eyes 2 (two) times daily as needed (dry/irritated eyes)., Disp: , Rfl:    rosuvastatin (CRESTOR) 40 MG tablet, Take 1 tablet (40 mg total) by mouth daily. (Patient taking differently: Take 20 mg by mouth in  the morning and at bedtime.), Disp: 90 tablet, Rfl: 1   levETIRAcetam (KEPPRA) 500 MG tablet, Take 1 tablet (500 mg total) by mouth 2 (two) times daily. Needs to establish with new PCP.  Dr. Buelah Manis is no longer at this office.  Must be seen before more refills., Disp: 60 tablet, Rfl: 2  Observations/Objective: Physical Exam  Assessment and Plan: Seizure disorder Refill Keppra 500 mg twice daily  Follow Up Instructions: No follow-ups on file.   I discussed the assessment and treatment plan with the patient. The patient was provided an opportunity to ask questions, and all were answered. The patient agreed with the plan and demonstrated an understanding of the instructions.   The patient was advised to call back or seek an in-person evaluation if the symptoms worsen or if the condition fails to improve as anticipated.  The above assessment and management plan was discussed with the patient. The patient verbalized understanding of and has agreed to the management plan.   Alvira Monday, FNP

## 2022-11-06 ENCOUNTER — Encounter: Payer: Self-pay | Admitting: Family Medicine

## 2022-11-06 ENCOUNTER — Ambulatory Visit (INDEPENDENT_AMBULATORY_CARE_PROVIDER_SITE_OTHER): Payer: Medicare PPO | Admitting: Family Medicine

## 2022-11-06 VITALS — BP 134/72 | HR 55 | Ht 66.5 in | Wt 183.0 lb

## 2022-11-06 DIAGNOSIS — R7301 Impaired fasting glucose: Secondary | ICD-10-CM

## 2022-11-06 DIAGNOSIS — I10 Essential (primary) hypertension: Secondary | ICD-10-CM | POA: Diagnosis not present

## 2022-11-06 DIAGNOSIS — E038 Other specified hypothyroidism: Secondary | ICD-10-CM

## 2022-11-06 DIAGNOSIS — E559 Vitamin D deficiency, unspecified: Secondary | ICD-10-CM | POA: Diagnosis not present

## 2022-11-06 DIAGNOSIS — G40909 Epilepsy, unspecified, not intractable, without status epilepticus: Secondary | ICD-10-CM | POA: Diagnosis not present

## 2022-11-06 DIAGNOSIS — E7849 Other hyperlipidemia: Secondary | ICD-10-CM

## 2022-11-06 NOTE — Assessment & Plan Note (Signed)
She takes rosuvastatin 40 mg daily Denies muscle aches and pain Will assess her lipid panel today Lab Results  Component Value Date   CHOL 143 05/14/2022   HDL 87 05/14/2022   LDLCALC 40 05/14/2022   TRIG 82 05/14/2022   CHOLHDL 1.6 05/14/2022

## 2022-11-06 NOTE — Assessment & Plan Note (Signed)
Controlled She takes hydrochlorothiazide 12.5 mg daily and atenolol 50 mg daily Patient is asymptomatic today in the clinic Encouraged low-sodium diet with increased physical activity BP Readings from Last 3 Encounters:  11/06/22 134/72  08/01/22 (!) 170/72  07/10/22 132/68

## 2022-11-06 NOTE — Patient Instructions (Signed)
I appreciate the opportunity to provide care to you today!    Follow up:  4 months  Labs: please stop by the lab today to get your blood drawn (CBC, CMP, TSH, Lipid profile, HgA1c, Vit D)     Please continue to a heart-healthy diet and increase your physical activities. Try to exercise for 75mns at least five days a week.      It was a pleasure to see you and I look forward to continuing to work together on your health and well-being. Please do not hesitate to call the office if you need care or have questions about your care.   Have a wonderful day and week. With Gratitude, GAlvira MondayMSN, FNP-BC

## 2022-11-06 NOTE — Progress Notes (Signed)
Established Patient Office Visit  Subjective:  Patient ID: Molly Patel, female    DOB: 04/21/1946  Age: 77 y.o. MRN: [id number]  CC:  Chief Complaint  Patient presents with   Follow-up    4 month f/u no complaints.     HPI Molly Patel is a 77 y.o. female with past medical history of hypertension, hyperlipidemia, and seizure disorder presents for f/u of  chronic medical conditions. For the details of today's visit, please refer to the assessment and plan.     Past Medical History:  Diagnosis Date   Angioedema    B-cell lymphoma ([place])    Depression    Dizziness    Echocardiogram with ECG monitoring 02/23/2011   mild LVH, normal event monitor    Hyperlipidemia    Hypertension    takes meds daily   Incontinence    Insomnia    Ovarian cyst    Port-A-Cath in place 10/29/2021   Seizures ([place])    2 weeks ago   Stroke [facility]) 08/25/2014   Syncope    Vitamin D deficiency     Past Surgical History:  Procedure Laterality Date   ABDOMINAL HYSTERECTOMY     COLONOSCOPY N/A 11/02/2014   Procedure: COLONOSCOPY;  Surgeon: Rogene Houston, MD;  [facility];  Service: Endoscopy;  Laterality: N/A;  140 - moved to 12:55 - Ann to notify pt   CRANIOTOMY  06/29/2012   Procedure: CRANIOTOMY TUMOR EXCISION;  Surgeon: Faythe Ghee, MD;  Location: [place] [facility];  Service: Neurosurgery;  Laterality: Left;  Left frontal Craniotomy for Meningioma   ESOPHAGEAL DILATION N/A 11/02/2014   Procedure: ESOPHAGEAL DILATION;  Surgeon: Rogene Houston, MD;  [facility];  Service: Endoscopy;  Laterality: N/A;   ESOPHAGOGASTRODUODENOSCOPY N/A 11/02/2014   Procedure: ESOPHAGOGASTRODUODENOSCOPY (EGD);  Surgeon: Rogene Houston, MD;  [facility];  Service: Endoscopy;  Laterality: N/A;   PORT-A-CATH REMOVAL N/A 06/12/2022   Procedure: MINOR REMOVAL PORT-A-CATH;  Surgeon: Aviva Signs, MD;  Location: [facility] OR[facility];  Service: General;  Laterality: N/A;   PORTACATH PLACEMENT Left  10/18/2021   Procedure: INSERTION PORT-A-CATH;  Surgeon: Aviva Signs, MD;  Location: [facility];  Service: General;  Laterality: Left;   VESICOVAGINAL FISTULA CLOSURE W/ TAH      Family History  Problem Relation Age of Onset   Stroke Father    Asthma Grandchild    Arthritis Sister    Diabetes Sister    Colon cancer Brother     Social History   Socioeconomic History   Marital status: Divorced    Spouse name: Not on file   Number of children: 2   Years of education: Not on file   Highest education level: Not on file  Occupational History   Occupation: Retired    Comment: Tree surgeon  Tobacco Use   Smoking status: Former    Years: 7    Types: Cigarettes    Quit date: 08/25/1985    Years since quitting: 37.2   Smokeless tobacco: Never  Vaping Use   Vaping Use: Never used  Substance and Sexual Activity   Alcohol use: No   Drug use: No   Sexual activity: Not on file  Other Topics Concern   Not on file  Social History Narrative   Not on file   Social Determinants of Health   Financial Resource Strain: Low Risk  (06/24/2022)   Overall Financial Resource Strain (CARDIA)    Difficulty  of Paying Living Expenses: Not very hard  Food Insecurity: No Food Insecurity (02/17/2022)   Hunger Vital Sign    Worried About Running Out of Food in the Last Year: Never true    Ran Out of Food in the Last Year: Never true  Transportation Needs: No Transportation Needs (06/24/2022)   PRAPARE - Hydrologist (Medical): No    Lack of Transportation (Non-Medical): No  Physical Activity: Sufficiently Active (02/17/2022)   Exercise Vital Sign    Days of Exercise per Week: 3 days    Minutes of Exercise per Session: 50 min  Stress: Not on file  Social Connections: Not on file  Intimate Partner Violence: Not on file    Outpatient Medications Prior to Visit  Medication Sig Dispense Refill   aspirin EC 325 MG tablet Take 1 tablet (325 mg total) by mouth daily. 30  tablet 0   atenolol (TENORMIN) 50 MG tablet Take 1 tablet (50 mg total) by mouth daily. 90 tablet 2   cholecalciferol (VITAMIN D) 25 MCG (1000 UNIT) tablet TAKE 1 TABLET EVERY DAY 90 tablet 3   donepezil (ARICEPT) 5 MG tablet Take 1 tablet (5 mg total) by mouth at bedtime. 90 tablet 1   hydrochlorothiazide (MICROZIDE) 12.5 MG capsule TAKE 1 CAPSULE EVERY DAY (NO FURTHER REFILLS, MUST ESTABLISH WITH NEW PCP) 90 capsule 3   levETIRAcetam (KEPPRA) 500 MG tablet Take 1 tablet (500 mg total) by mouth 2 (two) times daily. Needs to establish with new PCP.  Dr. Buelah Manis is no longer at this office.  Must be seen before more refills. 60 tablet 2   memantine (NAMENDA) 5 MG tablet Take 5 mg by mouth in the morning.     RESTASIS 0.05 % ophthalmic emulsion Place 1 drop into both eyes 2 (two) times daily as needed (dry/irritated eyes).     rosuvastatin (CRESTOR) 40 MG tablet Take 1 tablet (40 mg total) by mouth daily. (Patient taking differently: Take 20 mg by mouth in the morning and at bedtime.) 90 tablet 1   No facility-administered medications prior to visit.    No Known Allergies  ROS Review of Systems  Constitutional:  Negative for chills and fever.  Eyes:  Negative for visual disturbance.  Respiratory:  Negative for chest tightness and shortness of breath.   Neurological:  Negative for dizziness and headaches.      Objective:    Physical Exam HENT:     Head: Normocephalic.     Mouth/Throat:     Mouth: Mucous membranes are moist.  Cardiovascular:     Rate and Rhythm: Normal rate.     Heart sounds: Normal heart sounds.  Pulmonary:     Effort: Pulmonary effort is normal.     Breath sounds: Normal breath sounds.  Neurological:     Mental Status: She is alert.     BP 134/72 (BP Location: Left Arm)   Pulse (!) 55   Ht 5' 6.5" (1.689 m)   Wt 183 lb 0.5 oz (83 kg)   SpO2 97%   BMI 29.10 kg/m  Wt Readings from Last 3 Encounters:  11/06/22 183 lb 0.5 oz (83 kg)  08/01/22 173 lb (78.5  kg)  07/10/22 171 lb 0.6 oz (77.6 kg)    Lab Results  Component Value Date   TSH 2.80 03/30/2020   Lab Results  Component Value Date   WBC 3.7 (L) 02/19/2022   HGB 11.9 (L) 02/19/2022   HCT 35.4 (L)  02/19/2022   MCV 97.8 02/19/2022   PLT 181 02/19/2022   Lab Results  Component Value Date   NA 141 05/14/2022   K 4.4 05/14/2022   CO2 24 05/14/2022   GLUCOSE 81 05/14/2022   BUN 14 05/14/2022   CREATININE 0.90 05/14/2022   BILITOT 0.7 02/19/2022   ALKPHOS 57 02/19/2022   AST 19 02/19/2022   ALT 16 02/19/2022   PROT 6.1 (L) 02/19/2022   ALBUMIN 3.7 02/19/2022   CALCIUM 9.8 05/14/2022   ANIONGAP 9 02/19/2022   EGFR 65 05/14/2022   Lab Results  Component Value Date   CHOL 143 05/14/2022   Lab Results  Component Value Date   HDL 87 05/14/2022   Lab Results  Component Value Date   LDLCALC 40 05/14/2022   Lab Results  Component Value Date   TRIG 82 05/14/2022   Lab Results  Component Value Date   CHOLHDL 1.6 05/14/2022   No results found for: "HGBA1C"    Assessment & Plan:  Primary hypertension Assessment & Plan: Controlled She takes hydrochlorothiazide 12.5 mg daily and atenolol 50 mg daily Patient is asymptomatic today in the clinic Encouraged low-sodium diet with increased physical activity BP Readings from Last 3 Encounters:  11/06/22 134/72  08/01/22 (!) 170/72  07/10/22 132/68     Orders: -     CMP14+EGFR -     CBC with Differential/Platelet  Seizure disorder ([place]) Assessment & Plan: Stable on Keppra 500 mg twice daily No recent seizure episode reported      Other hyperlipidemia Assessment & Plan: She takes rosuvastatin 40 mg daily Denies muscle aches and pain Will assess her lipid panel today Lab Results  Component Value Date   CHOL 143 05/14/2022   HDL 87 05/14/2022   LDLCALC 40 05/14/2022   TRIG 82 05/14/2022   CHOLHDL 1.6 05/14/2022     Orders: -     Lipid panel  IFG (impaired fasting glucose) -     Hemoglobin  A1c  Vitamin D deficiency -     VITAMIN D 25 Hydroxy (Vit-D Deficiency, Fractures)  Other specified hypothyroidism -     TSH + free T4    Follow-up: Return in about 4 months (around 03/08/2023).   Alvira Monday, FNP

## 2022-11-06 NOTE — Assessment & Plan Note (Signed)
Stable on Keppra 500 mg twice daily No recent seizure episode reported

## 2022-11-07 ENCOUNTER — Encounter: Payer: Self-pay | Admitting: Hematology

## 2022-11-07 LAB — CBC WITH DIFFERENTIAL/PLATELET
Basophils Absolute: 0 10*3/uL (ref 0.0–0.2)
Basos: 1 %
EOS (ABSOLUTE): 0.1 10*3/uL (ref 0.0–0.4)
Eos: 3 %
Hematocrit: 40.5 % (ref 34.0–46.6)
Hemoglobin: 13.3 g/dL (ref 11.1–15.9)
Immature Grans (Abs): 0 10*3/uL (ref 0.0–0.1)
Immature Granulocytes: 0 %
Lymphocytes Absolute: 0.8 10*3/uL (ref 0.7–3.1)
Lymphs: 26 %
MCH: 30 pg (ref 26.6–33.0)
MCHC: 32.8 g/dL (ref 31.5–35.7)
MCV: 91 fL (ref 79–97)
Monocytes Absolute: 0.5 10*3/uL (ref 0.1–0.9)
Monocytes: 15 %
Neutrophils Absolute: 1.7 10*3/uL (ref 1.4–7.0)
Neutrophils: 55 %
Platelets: 139 10*3/uL — ABNORMAL LOW (ref 150–450)
RBC: 4.44 x10E6/uL (ref 3.77–5.28)
RDW: 14.2 % (ref 11.7–15.4)
WBC: 3.1 10*3/uL — ABNORMAL LOW (ref 3.4–10.8)

## 2022-11-07 LAB — CMP14+EGFR
ALT: 33 IU/L — ABNORMAL HIGH (ref 0–32)
AST: 28 IU/L (ref 0–40)
Albumin/Globulin Ratio: 2.2 (ref 1.2–2.2)
Albumin: 4.4 g/dL (ref 3.8–4.8)
Alkaline Phosphatase: 65 IU/L (ref 44–121)
BUN/Creatinine Ratio: 20 (ref 12–28)
BUN: 18 mg/dL (ref 8–27)
Bilirubin Total: 0.4 mg/dL (ref 0.0–1.2)
CO2: 26 mmol/L (ref 20–29)
Calcium: 9.4 mg/dL (ref 8.7–10.3)
Chloride: 101 mmol/L (ref 96–106)
Creatinine, Ser: 0.89 mg/dL (ref 0.57–1.00)
Globulin, Total: 2 g/dL (ref 1.5–4.5)
Glucose: 83 mg/dL (ref 70–99)
Potassium: 4.6 mmol/L (ref 3.5–5.2)
Sodium: 143 mmol/L (ref 134–144)
Total Protein: 6.4 g/dL (ref 6.0–8.5)
eGFR: 66 mL/min/{1.73_m2} (ref >59)

## 2022-11-07 LAB — LIPID PANEL
Chol/HDL Ratio: 1.7 ratio (ref 0.0–4.4)
Cholesterol, Total: 134 mg/dL (ref 100–199)
HDL: 81 mg/dL (ref >39)
LDL Chol Calc (NIH): 38 mg/dL (ref 0–99)
Triglycerides: 74 mg/dL (ref 0–149)
VLDL Cholesterol Cal: 15 mg/dL (ref 5–40)

## 2022-11-07 LAB — HEMOGLOBIN A1C
Est. average glucose Bld gHb Est-mCnc: 131 mg/dL
Hgb A1c MFr Bld: 6.2 % — ABNORMAL HIGH (ref 4.8–5.6)

## 2022-11-07 LAB — TSH+FREE T4
Free T4: 0.58 ng/dL — ABNORMAL LOW (ref 0.82–1.77)
TSH: 2.73 u[IU]/mL (ref 0.450–4.500)

## 2022-11-07 LAB — VITAMIN D 25 HYDROXY (VIT D DEFICIENCY, FRACTURES): Vit D, 25-Hydroxy: 82.5 ng/mL (ref 30.0–100.0)

## 2022-11-11 ENCOUNTER — Other Ambulatory Visit: Payer: Self-pay | Admitting: Family Medicine

## 2022-11-11 DIAGNOSIS — E038 Other specified hypothyroidism: Secondary | ICD-10-CM

## 2022-11-11 DIAGNOSIS — D696 Thrombocytopenia, unspecified: Secondary | ICD-10-CM

## 2022-11-11 NOTE — Progress Notes (Signed)
Please return for labs in 6 weeks on December 10, 2022 to assess your thyroid levels and your platelet count Your platelet is slightly low, and your thyroid is slightly elevated. Your hemoglobin A1c is 6.2, indicating that you are prediabetic. I recommend avoiding simple carbohydrates, including cakes, sweet desserts, ice cream, soda (diet or regular), sweet tea, candies, chips, cookies, store-bought juices, alcohol in excess of 1-2 drinks a day, lemonade, artificial sweeteners, donuts, coffee creamers, and sugar-free products.  I recommend avoiding greasy, fatty foods with increased physical activity.

## 2022-11-12 ENCOUNTER — Ambulatory Visit: Payer: Medicare PPO | Admitting: Family Medicine

## 2022-11-12 ENCOUNTER — Other Ambulatory Visit: Payer: Self-pay

## 2022-11-12 DIAGNOSIS — D696 Thrombocytopenia, unspecified: Secondary | ICD-10-CM

## 2022-11-12 DIAGNOSIS — E038 Other specified hypothyroidism: Secondary | ICD-10-CM

## 2022-11-23 ENCOUNTER — Other Ambulatory Visit: Payer: Self-pay | Admitting: Family Medicine

## 2022-11-23 DIAGNOSIS — G40909 Epilepsy, unspecified, not intractable, without status epilepticus: Secondary | ICD-10-CM

## 2022-11-25 ENCOUNTER — Other Ambulatory Visit: Payer: Self-pay

## 2022-12-10 ENCOUNTER — Other Ambulatory Visit: Payer: Self-pay | Admitting: Family Medicine

## 2022-12-10 DIAGNOSIS — R4189 Other symptoms and signs involving cognitive functions and awareness: Secondary | ICD-10-CM

## 2022-12-22 ENCOUNTER — Other Ambulatory Visit: Payer: Self-pay | Admitting: Nurse Practitioner

## 2023-01-12 ENCOUNTER — Encounter: Payer: Self-pay | Admitting: Family Medicine

## 2023-01-12 ENCOUNTER — Ambulatory Visit (INDEPENDENT_AMBULATORY_CARE_PROVIDER_SITE_OTHER): Payer: Medicare PPO | Admitting: Family Medicine

## 2023-01-12 VITALS — BP 128/76 | HR 63 | Ht 66.5 in | Wt 178.1 lb

## 2023-01-12 DIAGNOSIS — E039 Hypothyroidism, unspecified: Secondary | ICD-10-CM

## 2023-01-12 DIAGNOSIS — N3281 Overactive bladder: Secondary | ICD-10-CM

## 2023-01-12 DIAGNOSIS — E7849 Other hyperlipidemia: Secondary | ICD-10-CM | POA: Diagnosis not present

## 2023-01-12 DIAGNOSIS — M19019 Primary osteoarthritis, unspecified shoulder: Secondary | ICD-10-CM | POA: Insufficient documentation

## 2023-01-12 MED ORDER — MIRABEGRON ER 25 MG PO TB24
25.0000 mg | ORAL_TABLET | Freq: Every day | ORAL | 1 refills | Status: DC
Start: 2023-01-12 — End: 2023-01-12

## 2023-01-12 MED ORDER — ROSUVASTATIN CALCIUM 40 MG PO TABS: 40.0000 mg | ORAL_TABLET | Freq: Every day | ORAL | 1 refills | Status: DC

## 2023-01-12 MED ORDER — CAPSAICIN 0.05 % EX CREA
TOPICAL_CREAM | CUTANEOUS | 0 refills | Status: DC
Start: 2023-01-12 — End: 2024-01-13

## 2023-01-12 MED ORDER — MIRABEGRON ER 25 MG PO TB24
25.0000 mg | ORAL_TABLET | Freq: Every day | ORAL | 1 refills | Status: DC
Start: 2023-01-12 — End: 2023-01-29

## 2023-01-12 MED ORDER — ACETAMINOPHEN 500 MG PO TABS
500.0000 mg | ORAL_TABLET | Freq: Four times a day (QID) | ORAL | 0 refills | Status: DC | PRN
Start: 2023-01-12 — End: 2023-01-28

## 2023-01-12 MED ORDER — ACETAMINOPHEN 500 MG PO TABS
500.0000 mg | ORAL_TABLET | Freq: Four times a day (QID) | ORAL | 0 refills | Status: DC | PRN
Start: 2023-01-12 — End: 2023-01-12

## 2023-01-12 NOTE — Assessment & Plan Note (Signed)
Chronic condition Will treat with Myrbetriq 25 mg daily

## 2023-01-12 NOTE — Progress Notes (Signed)
Established Patient Office Visit  Subjective:  Patient ID: Molly Patel, female    DOB: Jun 20, 1946  Age: 77 y.o. MRN: [id number]  CC:  Chief Complaint  Patient presents with   Chronic Care Management    Last labs were on 11/06/2022, pt has concerns about vitamin d levels.    Arm Pain    Pt reports arm pain when she goes to lifting or moving a certain way, worsened in the last month, 12/13/2022.     HPI Molly Patel is a 77 y.o. female with past medical history of hypertension, overactive bladder, and lumbar stenosis presents for f/u of  chronic medical conditions. For the details of today's visit, please refer to the assessment and plan.      Past Medical History:  Diagnosis Date   Angioedema    B-cell lymphoma ([facility])    Depression    Dizziness    Echocardiogram with ECG monitoring 02/23/2011   mild LVH, normal event monitor    Hyperlipidemia    Hypertension    takes meds daily   Incontinence    Insomnia    Ovarian cyst    Port-A-Cath in place 10/29/2021   Seizures ([facility])    2 weeks ago   Stroke [facility]) 08/25/2014   Syncope    Vitamin D deficiency     Past Surgical History:  Procedure Laterality Date   ABDOMINAL HYSTERECTOMY     COLONOSCOPY N/A 11/02/2014   Procedure: COLONOSCOPY;  Surgeon: Malissa Hippo, MD;  [facility];  Service: Endoscopy;  Laterality: N/A;  140 - moved to 12:55 - Ann to notify pt   CRANIOTOMY  06/29/2012   Procedure: CRANIOTOMY TUMOR EXCISION;  Surgeon: Reinaldo Meeker, MD;  [facility];  Service: Neurosurgery;  Laterality: Left;  Left frontal Craniotomy for Meningioma   ESOPHAGEAL DILATION N/A 11/02/2014   Procedure: ESOPHAGEAL DILATION;  Surgeon: Malissa Hippo, MD;  [facility];  Service: Endoscopy;  Laterality: N/A;   ESOPHAGOGASTRODUODENOSCOPY N/A 11/02/2014   Procedure: ESOPHAGOGASTRODUODENOSCOPY (EGD);  Surgeon: Malissa Hippo, MD;  [facility];  Service: Endoscopy;  Laterality: N/A;    PORT-A-CATH REMOVAL N/A 06/12/2022   Procedure: MINOR REMOVAL PORT-A-CATH;  Surgeon: Franky Macho, MD;  Location: [facility];  Service: General;  Laterality: N/A;   PORTACATH PLACEMENT Left 10/18/2021   Procedure: INSERTION PORT-A-CATH;  Surgeon: Franky Macho, MD;  Location: [facility];  Service: General;  Laterality: Left;   VESICOVAGINAL FISTULA CLOSURE W/ TAH      Family History  Problem Relation Age of Onset   Stroke Father    Asthma Grandchild    Arthritis Sister    Diabetes Sister    Colon cancer Brother     Social History   Socioeconomic History   Marital status: Divorced    Spouse name: Not on file   Number of children: 2   Years of education: Not on file   Highest education level: Not on file  Occupational History   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  Tobacco Use   Smoking status: Former    Years: 7    Types: Cigarettes    Quit date: 08/25/1985    Years since quitting: 37.4   Smokeless tobacco: Never  Vaping Use   Vaping Use: Never used  Substance and Sexual Activity   Alcohol use: No   Drug use: No   Sexual activity: Not on file  Other Topics Concern  Not on file  Social History Narrative   Not on file   Social Determinants of Health   Financial Resource Strain: Low Risk  (06/24/2022)   Overall Financial Resource Strain (CARDIA)    Difficulty of Paying Living Expenses: Not very hard  Food Insecurity: No Food Insecurity (02/17/2022)   Hunger Vital Sign    Worried About Running Out of Food in the Last Year: Never true    Ran Out of Food in the Last Year: Never true  Transportation Needs: No Transportation Needs (06/24/2022)   PRAPARE - Administrator, Civil Service (Medical): No    Lack of Transportation (Non-Medical): No  Physical Activity: Sufficiently Active (02/17/2022)   Exercise Vital Sign    Days of Exercise per Week: 3 days    Minutes of Exercise per Session: 50 min  Stress: Not on file  Social Connections: Not on file   Intimate Partner Violence: Not on file    Outpatient Medications Prior to Visit  Medication Sig Dispense Refill   aspirin EC 325 MG tablet Take 1 tablet (325 mg total) by mouth daily. 30 tablet 0   atenolol (TENORMIN) 50 MG tablet TAKE 1 TABLET EVERY DAY 90 tablet 3   cholecalciferol (VITAMIN D) 25 MCG (1000 UNIT) tablet TAKE 1 TABLET EVERY DAY 90 tablet 3   donepezil (ARICEPT) 5 MG tablet TAKE 1 TABLET AT BEDTIME 90 tablet 3   hydrochlorothiazide (MICROZIDE) 12.5 MG capsule TAKE 1 CAPSULE EVERY DAY (NO FURTHER REFILLS, MUST ESTABLISH WITH NEW PCP) 90 capsule 3   levETIRAcetam (KEPPRA) 500 MG tablet TAKE 1 TABLET TWICE DAILY (NEED TO ESTABLISH WITH NEW PCP. MUST BE SEEN BEFORE MORE REFILLS.) 180 tablet 3   memantine (NAMENDA) 5 MG tablet Take 5 mg by mouth in the morning.     RESTASIS 0.05 % ophthalmic emulsion Place 1 drop into both eyes 2 (two) times daily as needed (dry/irritated eyes).     rosuvastatin (CRESTOR) 40 MG tablet Take 1 tablet (40 mg total) by mouth daily. (Patient taking differently: Take 20 mg by mouth in the morning and at bedtime.) 90 tablet 1   No facility-administered medications prior to visit.    No Known Allergies  ROS Review of Systems  Constitutional:  Negative for chills and fever.  Eyes:  Negative for visual disturbance.  Respiratory:  Negative for chest tightness and shortness of breath.   Musculoskeletal:  Positive for arthralgias.  Neurological:  Negative for dizziness and headaches.      Objective:    Physical Exam HENT:     Head: Normocephalic.     Mouth/Throat:     Mouth: Mucous membranes are moist.  Cardiovascular:     Rate and Rhythm: Normal rate.     Heart sounds: Normal heart sounds.  Pulmonary:     Effort: Pulmonary effort is normal.     Breath sounds: Normal breath sounds.  Musculoskeletal:     Comments: Active range of motion of the shoulders with mild tenderness reported   Neurological:     Mental Status: She is alert.      BP 128/76 (BP Location: Left Arm)   Pulse 63   Ht 5' 6.5" (1.689 m)   Wt 178 lb 1.3 oz (80.8 kg)   SpO2 97%   BMI 28.31 kg/m  Wt Readings from Last 3 Encounters:  01/12/23 178 lb 1.3 oz (80.8 kg)  11/06/22 183 lb 0.5 oz (83 kg)  08/01/22 173 lb (78.5 kg)  Lab Results  Component Value Date   TSH 2.730 11/06/2022   Lab Results  Component Value Date   WBC 3.1 (L) 11/06/2022   HGB 13.3 11/06/2022   HCT 40.5 11/06/2022   MCV 91 11/06/2022   PLT 139 (L) 11/06/2022   Lab Results  Component Value Date   NA 143 11/06/2022   K 4.6 11/06/2022   CO2 26 11/06/2022   GLUCOSE 83 11/06/2022   BUN 18 11/06/2022   CREATININE 0.89 11/06/2022   BILITOT 0.4 11/06/2022   ALKPHOS 65 11/06/2022   AST 28 11/06/2022   ALT 33 (H) 11/06/2022   PROT 6.4 11/06/2022   ALBUMIN 4.4 11/06/2022   CALCIUM 9.4 11/06/2022   ANIONGAP 9 02/19/2022   EGFR 66 11/06/2022   Lab Results  Component Value Date   CHOL 134 11/06/2022   Lab Results  Component Value Date   HDL 81 11/06/2022   Lab Results  Component Value Date   LDLCALC 38 11/06/2022   Lab Results  Component Value Date   TRIG 74 11/06/2022   Lab Results  Component Value Date   CHOLHDL 1.7 11/06/2022   Lab Results  Component Value Date   HGBA1C 6.2 (H) 11/06/2022      Assessment & Plan:  Arthritis of shoulder Assessment & Plan: Complains of bilateral shoulder stiffness in the morning that subsides with exercise No recent trauma or injury reported to the shoulders and knees Complains of body aches and pain  Symptom likely of arthritis Encourage to take Tylenol 500 mg every 8 hours as needed Encouraged heat application to the affected site Encouraged use of topical capsicin 0.05% cream  Orders: -     Acetaminophen; Take 1 tablet (500 mg total) by mouth every 6 (six) hours as needed.  Dispense: 30 tablet; Refill: 0 -     Capsaicin; Apply thin film to affected areas 3 to 4 times daily.  Dispense: 57 g; Refill:  0  OAB (overactive bladder) Assessment & Plan: Chronic condition Will treat with Myrbetriq 25 mg daily  Orders: -     Mirabegron ER; Take 1 tablet (25 mg total) by mouth daily.  Dispense: 30 tablet; Refill: 1  Hypothyroidism, unspecified type -     TSH + free T4  Other hyperlipidemia -     Rosuvastatin Calcium; Take 1 tablet (40 mg total) by mouth daily.  Dispense: 90 tablet; Refill: 1    Follow-up: No follow-ups on file.   Gilmore Laroche, FNP

## 2023-01-12 NOTE — Assessment & Plan Note (Addendum)
Complains of bilateral shoulder stiffness in the morning that subsides with exercise No recent trauma or injury reported to the shoulders and knees Complains of body aches and pain  Symptom likely of arthritis Encourage to take Tylenol 500 mg every 8 hours as needed Encouraged heat application to the affected site Encouraged use of topical capsicin 0.05% cream

## 2023-01-12 NOTE — Patient Instructions (Addendum)
I appreciate the opportunity to provide care to you today!    Follow up:  03/12/2023  Labs: please stop by the lab today to get your blood drawn (TSH)  Overactive Bladder Please start taking Myrbetriq 25 mg daily  Arthritis in your shoulders Please start taking Tylenol 500 mg every 6 hours as needed for pain Topical capsicin cream disorder you may apply 3-4 times daily as needed for arthritis pain in the affected shoulder  Please continue taking over-the-counter calcium and vitamin D to keep your bones healthy and strong  Hyperlipidemia A refill of rosuvastatin 40 mg is sent to your pharmacy  Please follow-up sooner if your symptoms worsen or is unrelieved by current treatment regimen  Please continue to a heart-healthy diet and increase your physical activities. Try to exercise for at least five days a week.      It was a pleasure to see you and I look forward to continuing to work together on your health and well-being. Please do not hesitate to call the office if you need care or have questions about your care.   Have a wonderful day and week. With Gratitude, Gilmore Laroche MSN, FNP-BC

## 2023-01-13 ENCOUNTER — Other Ambulatory Visit: Payer: Self-pay | Admitting: Family Medicine

## 2023-01-13 DIAGNOSIS — E039 Hypothyroidism, unspecified: Secondary | ICD-10-CM

## 2023-01-13 LAB — TSH+FREE T4
Free T4: 0.66 ng/dL — ABNORMAL LOW (ref 0.82–1.77)
TSH: 2.78 u[IU]/mL (ref 0.450–4.500)

## 2023-01-13 MED ORDER — LEVOTHYROXINE SODIUM 25 MCG PO TABS
25.0000 ug | ORAL_TABLET | Freq: Every day | ORAL | 0 refills | Status: DC
Start: 2023-01-13 — End: 2024-01-13

## 2023-01-13 MED ORDER — LEVOTHYROXINE SODIUM 25 MCG PO TABS
25.0000 ug | ORAL_TABLET | Freq: Every day | ORAL | 3 refills | Status: DC
Start: 2023-01-13 — End: 2023-01-13

## 2023-01-13 NOTE — Progress Notes (Signed)
Please inform the patient that I have started her on therapy with synthroid 25 mcg to take daily on empty stomach for 6 weeks for transient hypothyroidism. The thyroid hormone is low.  Please encourage the patient to return for labs in 6 weeks on February 24, 2023

## 2023-01-15 ENCOUNTER — Telehealth: Payer: Self-pay | Admitting: Family Medicine

## 2023-01-15 NOTE — Telephone Encounter (Signed)
Pt called in requesting call back

## 2023-01-15 NOTE — Telephone Encounter (Signed)
Pt having trouble with medications states [facility] will be faxing forms to clarify on prescriptions.

## 2023-01-25 ENCOUNTER — Other Ambulatory Visit: Payer: Self-pay | Admitting: Family Medicine

## 2023-01-28 ENCOUNTER — Other Ambulatory Visit: Payer: Self-pay | Admitting: Family Medicine

## 2023-01-28 DIAGNOSIS — M19019 Primary osteoarthritis, unspecified shoulder: Secondary | ICD-10-CM

## 2023-01-29 ENCOUNTER — Other Ambulatory Visit: Payer: Self-pay | Admitting: Family Medicine

## 2023-01-29 ENCOUNTER — Telehealth: Payer: Self-pay | Admitting: Family Medicine

## 2023-01-29 DIAGNOSIS — N3281 Overactive bladder: Secondary | ICD-10-CM

## 2023-01-29 MED ORDER — GEMTESA 75 MG PO TABS
75.0000 mg | ORAL_TABLET | Freq: Every day | ORAL | 1 refills | Status: DC
Start: 2023-01-29 — End: 2024-01-13

## 2023-01-29 NOTE — Telephone Encounter (Signed)
m °

## 2023-02-06 ENCOUNTER — Other Ambulatory Visit: Payer: Self-pay | Admitting: Family Medicine

## 2023-02-06 DIAGNOSIS — M19019 Primary osteoarthritis, unspecified shoulder: Secondary | ICD-10-CM

## 2023-03-12 ENCOUNTER — Ambulatory Visit: Payer: Medicare PPO | Admitting: Family Medicine

## 2023-04-03 DIAGNOSIS — R5383 Other fatigue: Secondary | ICD-10-CM | POA: Diagnosis not present

## 2023-04-03 DIAGNOSIS — Z131 Encounter for screening for diabetes mellitus: Secondary | ICD-10-CM | POA: Diagnosis not present

## 2023-04-03 DIAGNOSIS — D72819 Decreased white blood cell count, unspecified: Secondary | ICD-10-CM | POA: Diagnosis not present

## 2023-04-03 DIAGNOSIS — I10 Essential (primary) hypertension: Secondary | ICD-10-CM | POA: Diagnosis not present

## 2023-04-03 DIAGNOSIS — Z1329 Encounter for screening for other suspected endocrine disorder: Secondary | ICD-10-CM | POA: Diagnosis not present

## 2023-04-03 DIAGNOSIS — E559 Vitamin D deficiency, unspecified: Secondary | ICD-10-CM | POA: Diagnosis not present

## 2023-04-03 DIAGNOSIS — Z13220 Encounter for screening for lipoid disorders: Secondary | ICD-10-CM | POA: Diagnosis not present

## 2023-04-07 DIAGNOSIS — Z8709 Personal history of other diseases of the respiratory system: Secondary | ICD-10-CM | POA: Diagnosis not present

## 2023-04-07 DIAGNOSIS — E785 Hyperlipidemia, unspecified: Secondary | ICD-10-CM | POA: Diagnosis not present

## 2023-04-07 DIAGNOSIS — R030 Elevated blood-pressure reading, without diagnosis of hypertension: Secondary | ICD-10-CM | POA: Diagnosis not present

## 2023-04-07 DIAGNOSIS — R7303 Prediabetes: Secondary | ICD-10-CM | POA: Diagnosis not present

## 2023-04-07 DIAGNOSIS — Z6827 Body mass index (BMI) 27.0-27.9, adult: Secondary | ICD-10-CM | POA: Diagnosis not present

## 2023-04-07 DIAGNOSIS — C8510 Unspecified B-cell lymphoma, unspecified site: Secondary | ICD-10-CM | POA: Diagnosis not present

## 2023-05-04 DIAGNOSIS — H5203 Hypermetropia, bilateral: Secondary | ICD-10-CM | POA: Diagnosis not present

## 2023-05-04 DIAGNOSIS — H35033 Hypertensive retinopathy, bilateral: Secondary | ICD-10-CM | POA: Diagnosis not present

## 2023-05-07 DIAGNOSIS — R5383 Other fatigue: Secondary | ICD-10-CM | POA: Diagnosis not present

## 2023-05-07 DIAGNOSIS — D72819 Decreased white blood cell count, unspecified: Secondary | ICD-10-CM | POA: Diagnosis not present

## 2023-05-12 DIAGNOSIS — R7303 Prediabetes: Secondary | ICD-10-CM | POA: Diagnosis not present

## 2023-05-12 DIAGNOSIS — E785 Hyperlipidemia, unspecified: Secondary | ICD-10-CM | POA: Diagnosis not present

## 2023-05-12 DIAGNOSIS — R030 Elevated blood-pressure reading, without diagnosis of hypertension: Secondary | ICD-10-CM | POA: Diagnosis not present

## 2023-05-12 DIAGNOSIS — Z8709 Personal history of other diseases of the respiratory system: Secondary | ICD-10-CM | POA: Diagnosis not present

## 2023-05-12 DIAGNOSIS — C8510 Unspecified B-cell lymphoma, unspecified site: Secondary | ICD-10-CM | POA: Diagnosis not present

## 2023-05-12 DIAGNOSIS — Z6828 Body mass index (BMI) 28.0-28.9, adult: Secondary | ICD-10-CM | POA: Diagnosis not present

## 2023-07-08 ENCOUNTER — Other Ambulatory Visit: Payer: Self-pay | Admitting: Family Medicine

## 2023-09-08 ENCOUNTER — Other Ambulatory Visit: Payer: Self-pay | Admitting: Family Medicine

## 2023-09-08 DIAGNOSIS — N3281 Overactive bladder: Secondary | ICD-10-CM

## 2023-09-08 DIAGNOSIS — G40909 Epilepsy, unspecified, not intractable, without status epilepticus: Secondary | ICD-10-CM

## 2023-09-10 ENCOUNTER — Other Ambulatory Visit: Payer: Self-pay | Admitting: Family Medicine

## 2023-09-10 DIAGNOSIS — G40909 Epilepsy, unspecified, not intractable, without status epilepticus: Secondary | ICD-10-CM

## 2023-09-10 MED ORDER — LEVETIRACETAM 500 MG PO TABS: 500.0000 mg | ORAL_TABLET | Freq: Two times a day (BID) | ORAL | 3 refills | Status: DC

## 2023-10-16 ENCOUNTER — Other Ambulatory Visit: Payer: Self-pay | Admitting: Family Medicine

## 2023-10-16 DIAGNOSIS — R4189 Other symptoms and signs involving cognitive functions and awareness: Secondary | ICD-10-CM

## 2023-10-24 IMAGING — MR MR HEAD WO/W CM
14 of 16 series · 30 of 48 positions shown · IV contrast (7.5 ml Gadavist)
Comparison: MRI of the brain May 30, 2019.

CLINICAL DATA: Hematologic malignancy.  B-cell lymphoma.

EXAM:
MRI HEAD WITHOUT AND WITH CONTRAST
TECHNIQUE: Multiplanar, multiecho pulse sequences of the brain and surrounding
structures were obtained without and with intravenous contrast.
CONTRAST:  7.5mL GADAVIST GADOBUTROL 1 MMOL/ML IV SOLN

[Series 5: DWI · axial · 4.0mm · 0.88mm/px · z∈[-52,+96]mm · 3 of 38 slices shown (1 of 4)]
[im 1/38]
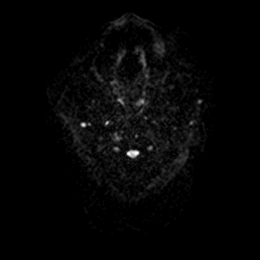
[im 19/38]
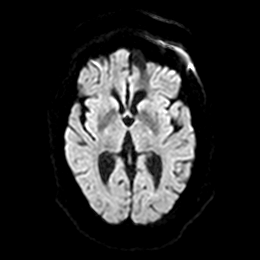
[im 38/38]
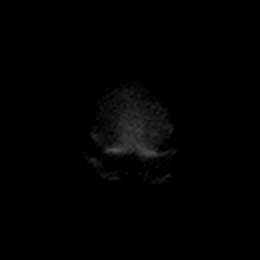

[Series 6: DWI · axial · 4.0mm · 0.88mm/px · z∈[-52,+96]mm · 3 of 38 slices shown (2 of 4)]
[im 1/38]
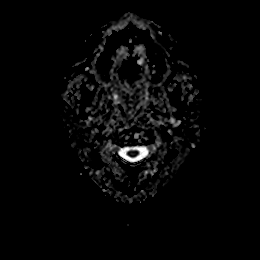
[im 19/38]
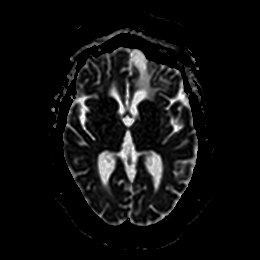
[im 38/38]
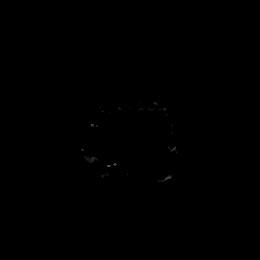

[Series 7: DWI · coronal · 5.0mm · 0.88mm/px · 2 of 32 slices shown (3 of 4)]
[im 1/32]
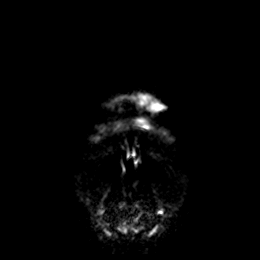
[im 32/32]
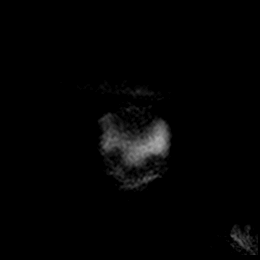

[Series 8: DWI · coronal · 5.0mm · 0.88mm/px · 2 of 32 slices shown (4 of 4)]
[im 1/32]
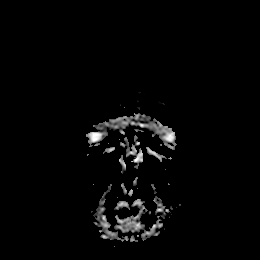
[im 32/32]
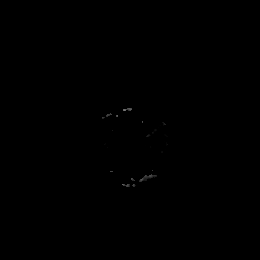

[Series 9: T1 · sagittal · 5.0mm · 0.75mm/px · 1 of 21 slices shown]
[im 1/21]
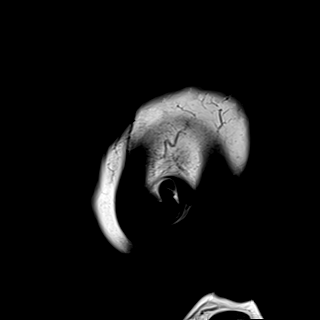

[Series 10: T2 · axial · 5.0mm · 0.72mm/px · 1 of 24 slices shown]
[im 1/24]
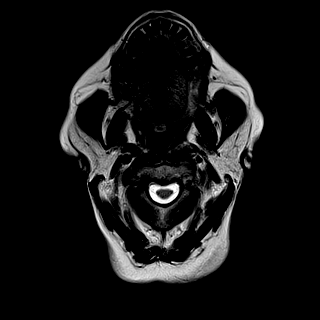

[Series 11: mag_images · axial · 3.0mm · 0.90mm/px · z∈[-56,+97]mm · 3 of 52 slices shown]
[im 1/52]
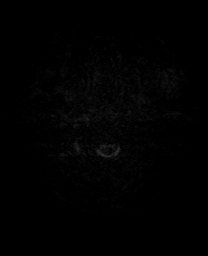
[im 26/52]
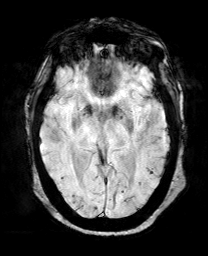
[im 52/52]
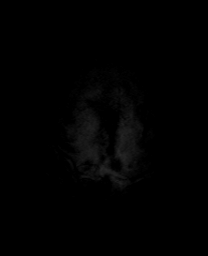

[Series 12: pha_images · axial · 3.0mm · 0.90mm/px · z∈[-53,+97]mm · 3 of 51 slices shown]
[im 1/51]
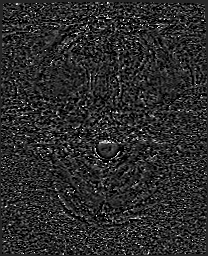
[im 26/51]
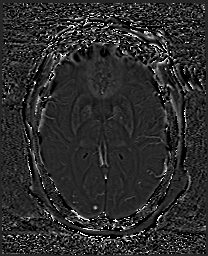
[im 51/51]
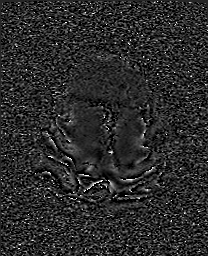

[Series 13: swi_images · axial · 3.0mm · 0.90mm/px · z∈[-56,+97]mm · 3 of 52 slices shown]
[im 1/52]
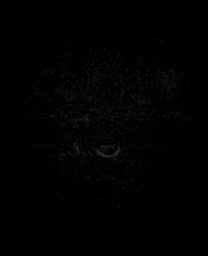
[im 26/52]
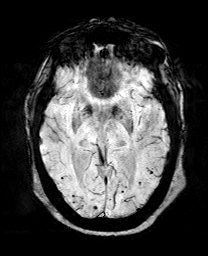
[im 52/52]
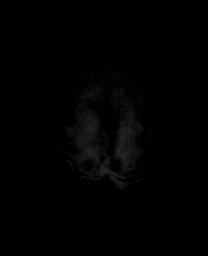

[Series 15: FLAIR · axial · 3.0mm · 0.45mm/px · z∈[-49,+98]mm · 3 of 50 slices shown]
[im 1/50]
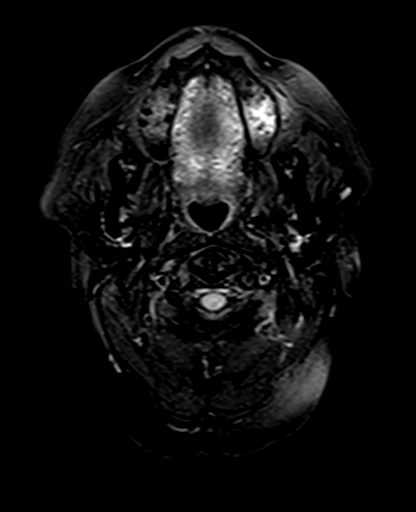
[im 25/50]
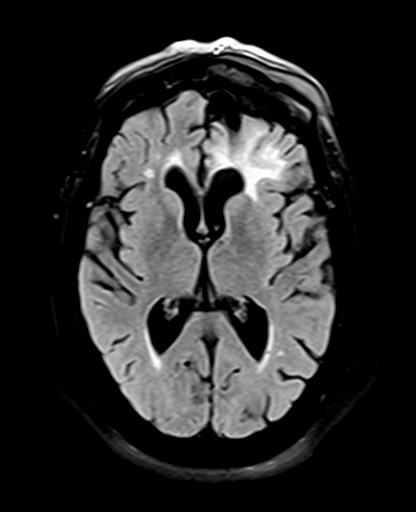
[im 50/50]
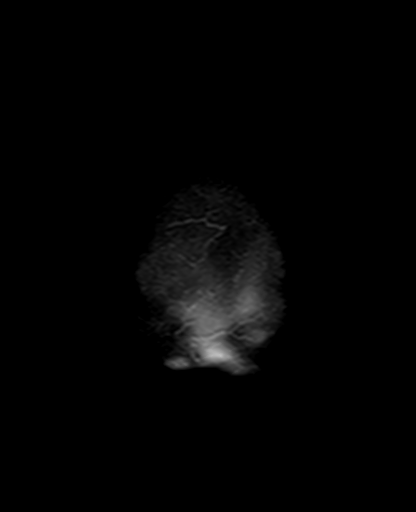

[Series 17: T2 post-contrast · coronal · 5.0mm · 0.69mm/px · 2 of 32 slices shown]
[im 1/32]
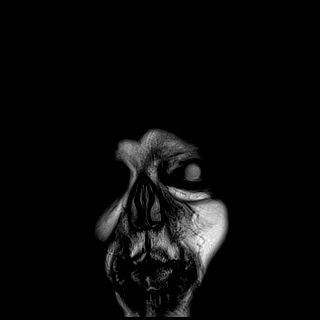
[im 32/32]
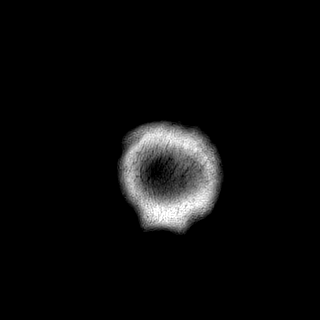

[Series 19: T1 post-contrast · coronal · 5.0mm · 0.34mm/px · 2 of 28 slices shown (1 of 3)]
[im 1/28]
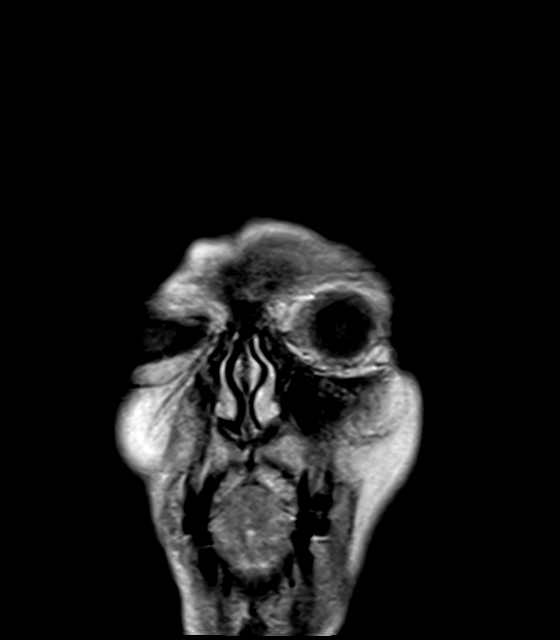
[im 28/28]
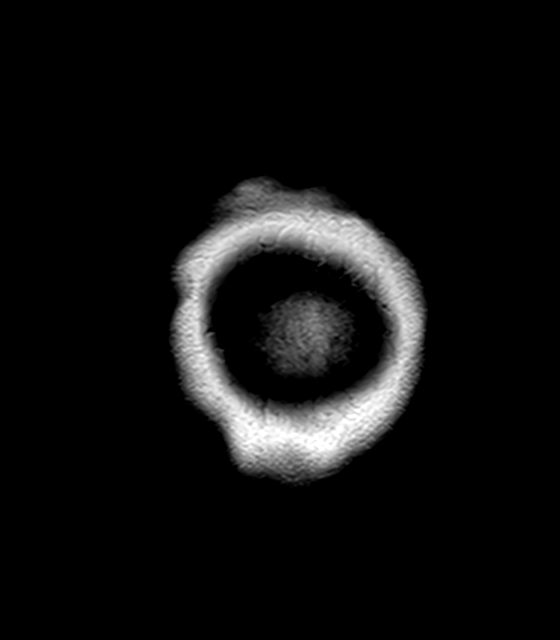

[Series 20: T1 post-contrast · sagittal · 5.0mm · 0.75mm/px · 1 of 21 slices shown (2 of 3)]
[im 1/21]
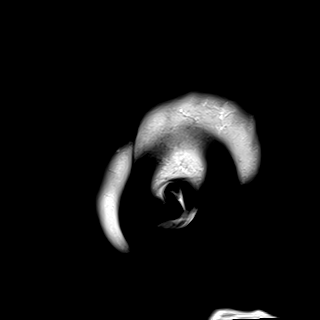

[Series 21: T1 post-contrast · sagittal · 5.0mm · 0.75mm/px · 1 of 21 slices shown (3 of 3)]
[im 1/21]
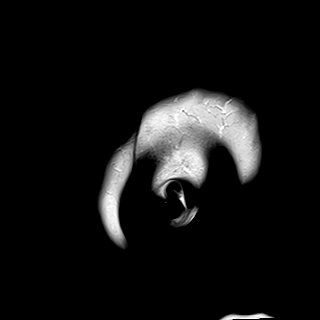

[30 of 48 positions shown; findings below may reference images not displayed]

FINDINGS: Brain: Postsurgical changes from left craniotomy and subjacent area
of encephalomalacia and gliosis in the left frontal lobe. No focal
area of abnormal contrast enhancement to suggest residual/recurrent
tumor. Scattered and confluent foci of T2 hyperintensity are seen
within the white matter of the cerebral hemispheres and within the
pons, nonspecific, progressed since prior MRI. Innumerable foci of
susceptibility artifact in the bilateral cerebral and cerebellar
sulci. Although this is more prominent than on prior MRI, this could
be at least in part related to technique. Ectatic lateral ventricles
likely related to central parenchymal volume loss. No parenchymal
focus of abnormal contrast enhancement.

Enlarge, partial empty sella, unchanged.

Vascular: Normal flow voids.

Skull and upper cervical spine: Postsurgical changes from left
frontal craniotomy.

Sinuses/Orbits: Mild scattered mucosal thickening throughout the
paranasal sinuses. The orbits are maintained.

Other: None.
IMPRESSION: 1. Postsurgical changes from resection of left frontal extra-axial
lesion without evidence of residual/recurrent tumor.
2. Mild interval progression of chronic white matter disease.
3. Innumerable foci of susceptibility artifact along the cerebral
and cerebellar sulci, consistent with prior microhemorrhages,
suspicious for underlying amyloid angiopathy. Although this appear
more prominent than on prior MRI, this could be at least in part
related to technique.

## 2023-11-04 DIAGNOSIS — Z13220 Encounter for screening for lipoid disorders: Secondary | ICD-10-CM | POA: Diagnosis not present

## 2023-11-04 DIAGNOSIS — Z1321 Encounter for screening for nutritional disorder: Secondary | ICD-10-CM | POA: Diagnosis not present

## 2023-11-04 DIAGNOSIS — I10 Essential (primary) hypertension: Secondary | ICD-10-CM | POA: Diagnosis not present

## 2023-11-04 DIAGNOSIS — Z131 Encounter for screening for diabetes mellitus: Secondary | ICD-10-CM | POA: Diagnosis not present

## 2023-11-04 DIAGNOSIS — G40909 Epilepsy, unspecified, not intractable, without status epilepticus: Secondary | ICD-10-CM | POA: Diagnosis not present

## 2023-11-04 DIAGNOSIS — Z1329 Encounter for screening for other suspected endocrine disorder: Secondary | ICD-10-CM | POA: Diagnosis not present

## 2023-11-10 DIAGNOSIS — R7303 Prediabetes: Secondary | ICD-10-CM | POA: Diagnosis not present

## 2023-11-10 DIAGNOSIS — C8510 Unspecified B-cell lymphoma, unspecified site: Secondary | ICD-10-CM | POA: Diagnosis not present

## 2023-11-10 DIAGNOSIS — E785 Hyperlipidemia, unspecified: Secondary | ICD-10-CM | POA: Diagnosis not present

## 2023-11-10 DIAGNOSIS — Z6830 Body mass index (BMI) 30.0-30.9, adult: Secondary | ICD-10-CM | POA: Diagnosis not present

## 2023-11-10 DIAGNOSIS — I10 Essential (primary) hypertension: Secondary | ICD-10-CM | POA: Diagnosis not present

## 2024-01-13 ENCOUNTER — Emergency Department (HOSPITAL_COMMUNITY)

## 2024-01-13 ENCOUNTER — Emergency Department (HOSPITAL_COMMUNITY)
Admission: EM | Admit: 2024-01-13 | Discharge: 2024-01-13 | Disposition: A | Discharge status: Home / Self Care | Attending: Emergency Medicine | Admitting: Emergency Medicine

## 2024-01-13 ENCOUNTER — Other Ambulatory Visit: Payer: Self-pay

## 2024-01-13 ENCOUNTER — Encounter (HOSPITAL_COMMUNITY): Payer: Self-pay

## 2024-01-13 DIAGNOSIS — E039 Hypothyroidism, unspecified: Secondary | ICD-10-CM | POA: Insufficient documentation

## 2024-01-13 DIAGNOSIS — R9082 White matter disease, unspecified: Secondary | ICD-10-CM | POA: Diagnosis not present

## 2024-01-13 DIAGNOSIS — R0602 Shortness of breath: Secondary | ICD-10-CM | POA: Diagnosis not present

## 2024-01-13 DIAGNOSIS — E860 Dehydration: Secondary | ICD-10-CM | POA: Diagnosis not present

## 2024-01-13 DIAGNOSIS — Z6826 Body mass index (BMI) 26.0-26.9, adult: Secondary | ICD-10-CM | POA: Diagnosis not present

## 2024-01-13 DIAGNOSIS — Z7982 Long term (current) use of aspirin: Secondary | ICD-10-CM | POA: Insufficient documentation

## 2024-01-13 DIAGNOSIS — Z79899 Other long term (current) drug therapy: Secondary | ICD-10-CM | POA: Diagnosis not present

## 2024-01-13 DIAGNOSIS — R4182 Altered mental status, unspecified: Secondary | ICD-10-CM | POA: Diagnosis not present

## 2024-01-13 DIAGNOSIS — G9389 Other specified disorders of brain: Secondary | ICD-10-CM | POA: Diagnosis not present

## 2024-01-13 DIAGNOSIS — I10 Essential (primary) hypertension: Secondary | ICD-10-CM | POA: Insufficient documentation

## 2024-01-13 DIAGNOSIS — R0600 Dyspnea, unspecified: Secondary | ICD-10-CM | POA: Diagnosis not present

## 2024-01-13 DIAGNOSIS — R55 Syncope and collapse: Secondary | ICD-10-CM | POA: Diagnosis not present

## 2024-01-13 LAB — URINALYSIS, ROUTINE W REFLEX MICROSCOPIC
Bacteria, UA: NONE SEEN
Bilirubin Urine: NEGATIVE
Glucose, UA: NEGATIVE mg/dL
Hgb urine dipstick: NEGATIVE
Ketones, ur: NEGATIVE mg/dL
Nitrite: NEGATIVE
Protein, ur: 100 mg/dL — AB
Specific Gravity, Urine: 1.016 (ref 1.005–1.030)
pH: 5 (ref 5.0–8.0)

## 2024-01-13 LAB — CBC WITH DIFFERENTIAL/PLATELET
Abs Immature Granulocytes: 0.18 10*3/uL — ABNORMAL HIGH (ref 0.00–0.07)
Basophils Absolute: 0.1 10*3/uL (ref 0.0–0.1)
Basophils Relative: 1 %
Eosinophils Absolute: 0.2 10*3/uL (ref 0.0–0.5)
Eosinophils Relative: 3 %
HCT: 34 % — ABNORMAL LOW (ref 36.0–46.0)
Hemoglobin: 11.1 g/dL — ABNORMAL LOW (ref 12.0–15.0)
Immature Granulocytes: 4 %
Lymphocytes Relative: 16 %
Lymphs Abs: 0.8 10*3/uL (ref 0.7–4.0)
MCH: 29.1 pg (ref 26.0–34.0)
MCHC: 32.6 g/dL (ref 30.0–36.0)
MCV: 89 fL (ref 80.0–100.0)
Monocytes Absolute: 0.8 10*3/uL (ref 0.1–1.0)
Monocytes Relative: 16 %
Neutro Abs: 3 10*3/uL (ref 1.7–7.7)
Neutrophils Relative %: 60 %
Platelets: 168 10*3/uL (ref 150–400)
RBC: 3.82 MIL/uL — ABNORMAL LOW (ref 3.87–5.11)
RDW: 15.9 % — ABNORMAL HIGH (ref 11.5–15.5)
WBC: 4.9 10*3/uL (ref 4.0–10.5)
nRBC: 0 % (ref 0.0–0.2)

## 2024-01-13 LAB — COMPREHENSIVE METABOLIC PANEL WITH GFR
ALT: 14 U/L (ref 0–44)
AST: 25 U/L (ref 15–41)
Albumin: 3.1 g/dL — ABNORMAL LOW (ref 3.5–5.0)
Alkaline Phosphatase: 101 U/L (ref 38–126)
Anion gap: 12 (ref 5–15)
BUN: 25 mg/dL — ABNORMAL HIGH (ref 8–23)
CO2: 23 mmol/L (ref 22–32)
Calcium: 9.4 mg/dL (ref 8.9–10.3)
Chloride: 100 mmol/L (ref 98–111)
Creatinine, Ser: 1.36 mg/dL — ABNORMAL HIGH (ref 0.44–1.00)
GFR, Estimated: 39 mL/min — ABNORMAL LOW (ref >60)
Glucose, Bld: 89 mg/dL (ref 70–99)
Potassium: 3.4 mmol/L — ABNORMAL LOW (ref 3.5–5.1)
Sodium: 135 mmol/L (ref 135–145)
Total Bilirubin: 0.8 mg/dL (ref 0.0–1.2)
Total Protein: 6.4 g/dL — ABNORMAL LOW (ref 6.5–8.1)

## 2024-01-13 LAB — MAGNESIUM: Magnesium: 2 mg/dL (ref 1.7–2.4)

## 2024-01-13 LAB — CBG MONITORING, ED: Glucose-Capillary: 88 mg/dL (ref 70–99)

## 2024-01-13 LAB — TROPONIN I (HIGH SENSITIVITY): Troponin I (High Sensitivity): 14 ng/L (ref <18)

## 2024-01-13 LAB — TSH: TSH: 2.359 u[IU]/mL (ref 0.350–4.500)

## 2024-01-13 MED ORDER — LACTATED RINGERS IV BOLUS
500.0000 mL | Freq: Once | INTRAVENOUS | Status: AC
  Administered 2024-01-13: 500 mL via INTRAVENOUS

## 2024-01-13 NOTE — ED Triage Notes (Signed)
 Pt arrived via POV from [facility] in [place] for further evaluation of dizziness, SOB, multiple syncopal episodes today. Pt reports this has been going on for past couple of days. Pt reports diarrhea and decreased appetite.

## 2024-01-13 NOTE — ED Notes (Signed)
 Pt tells this nurse she is ready to go- Dr Annabell Key informed of this.

## 2024-01-13 NOTE — ED Notes (Signed)
 Patient transported to CT

## 2024-01-13 NOTE — Discharge Instructions (Signed)
 Your testing showed that you were slightly dehydrated.  Please follow-up with your family doctor this week, drink plenty of clear liquids, ER for worsening symptoms

## 2024-01-13 NOTE — ED Provider Notes (Signed)
 [place] EMERGENC[facility] DEPARTMENT AT [facility] Provider Note   CSN: [id number] Arrival date & time: 01/13/24  1547     History  Chief Complaint  Patient presents with   Shortness of Breath    Molly Patel is a 78 y.o. female.   Shortness of Breath  This patient is a very pleasant 78 year old female history of hypertension, seizures, hypothyroidism and high cholesterol.  She follows with her doctor in the office fairly regularly most recently being seen in March.  She evidently had a history of lung cancer in remission and B-cell lymphoma.  She reports that she has been getting up more frequently at night to urinate, she has been becoming more generally weak in the family member at the bedside as an additional historian states that the downturn occurred approximately 3 weeks ago.  She is now so significantly weak that she has had a couple of syncopal episodes and has not been able to drive her car or even prepare food for herself, even getting up off of the bed is causing significant involvement of friends and family to help her.  Denies significant chest pain shortness of breath or abdominal pain, has had 2 days of mild diarrhea but no blood in the stools or black tarry stools.    Home Medications Prior to Admission medications   Medication Sig Start Date End Date Taking? Authorizing Provider  aspirin  EC 325 MG tablet Take 1 tablet (325 mg total) by mouth daily. 05/28/15  Yes [place], Marvell Slider, MD  atenolol  (TENORMIN ) 50 MG tablet TAKE 1 TABLET EVERY DAY 12/23/22  Yes Zarwolo, Gloria, FNP  cholecalciferol  (VITAMIN D ) 25 MCG (1000 UNIT) tablet TAKE 1 TABLET EVERY DAY 10/24/19  Yes [place], Marvell Slider, MD  hydrochlorothiazide  (MICROZIDE ) 12.5 MG capsule TAKE 1 CAPSULE EVERY DAY (NO FURTHER REFILLS, MUST ESTABLISH WITH NEW PCP) 07/08/23  Yes Zarwolo, Gloria, FNP  levETIRAcetam  (KEPPRA ) 500 MG tablet Take 1 tablet (500 mg total) by mouth 2 (two) times daily. 09/10/23  Yes Zarwolo,  Gloria, FNP  memantine  (NAMENDA ) 5 MG tablet Take 5 mg by mouth in the morning. 08/09/21  Yes [provider]      Allergies    Patient has no known allergies.    Review of Systems   Review of Systems  Respiratory:  Positive for shortness of breath.   All other systems reviewed and are negative.   Physical Exam Updated Vital Signs BP (!) 154/83   Pulse 71   Temp 98 F (36.7 C) (Oral)   Resp 16   Ht 1.689 m (5' 6.5")   Wt 81 kg   SpO2 98%   BMI 28.39 kg/m  Physical Exam Vitals and nursing note reviewed.  Constitutional:      General: She is not in acute distress.    Appearance: She is well-developed.  HENT:     Head: Normocephalic and atraumatic.     Mouth/Throat:     Pharynx: No oropharyngeal exudate.  Eyes:     General: No scleral icterus.       Right eye: No discharge.        Left eye: No discharge.     Conjunctiva/sclera: Conjunctivae normal.     Pupils: Pupils are equal, round, and reactive to light.  Neck:     Thyroid : No thyromegaly.     Vascular: No JVD.  Cardiovascular:     Rate and Rhythm: Normal rate and regular rhythm.     Heart sounds: Normal  heart sounds. No murmur heard.    No friction rub. No gallop.  Pulmonary:     Effort: Pulmonary effort is normal. No respiratory distress.     Breath sounds: Normal breath sounds. No wheezing or rales.  Abdominal:     General: Bowel sounds are normal. There is no distension.     Palpations: Abdomen is soft. There is no mass.     Tenderness: There is no abdominal tenderness.  Musculoskeletal:        General: No tenderness. Normal range of motion.     Cervical back: Normal range of motion and neck supple.     Right lower leg: No edema.  Lymphadenopathy:     Cervical: No cervical adenopathy.  Skin:    General: Skin is warm and dry.     Findings: No erythema or rash.  Neurological:     Mental Status: She is alert.     Coordination: Coordination normal.     Comments: Cranial nerves III through XII  are normal, speech is clear and goal-directed, bilateral grips are equal, there is no pronator drift, normal strength in all 4 extremities, no edema of the lower extremities  Psychiatric:        Behavior: Behavior normal.     ED Results / Procedures / Treatments   Labs (all labs ordered are listed, but only abnormal results are displayed) Labs Reviewed  COMPREHENSIVE METABOLIC PANEL WITH GFR - Abnormal; Notable for the following components:      Result Value   Potassium 3.4 (*)    BUN 25 (*)    Creatinine, Ser 1.36 (*)    Total Protein 6.4 (*)    Albumin 3.1 (*)    GFR, Estimated 39 (*)    All other components within normal limits  URINALYSIS, ROUTINE W REFLEX MICROSCOPIC - Abnormal; Notable for the following components:   APPearance HAZY (*)    Protein, ur 100 (*)    Leukocytes,Ua TRACE (*)    All other components within normal limits  CBC WITH DIFFERENTIAL/PLATELET - Abnormal; Notable for the following components:   RBC 3.82 (*)    Hemoglobin 11.1 (*)    HCT 34.0 (*)    RDW 15.9 (*)    Abs Immature Granulocytes 0.18 (*)    All other components within normal limits  MAGNESIUM   TSH  CBG MONITORING, ED  TROPONIN I (HIGH SENSITIVITY)    EKG EKG Interpretation Date/Time:  Wednesday Jan 13 2024 15:59:35 EDT Ventricular Rate:  71 PR Interval:  160 QRS Duration:  92 QT Interval:  380 QTC Calculation: 412 R Axis:   8  Text Interpretation: Normal sinus rhythm Minimal voltage criteria for LVH, may be normal variant ( R in aVL ) Cannot rule out Anterior infarct (cited on or before 13-Jan-2024) ST & T wave abnormality, consider inferolateral ischemia Abnormal ECG When compared with ECG of 24-Sep-2021 15:30, No significant change was found Confirmed by Early Glisson ([id number]) on 01/13/2024 4:09:33 PM  Radiology DG Chest Port 1 View Result Date: 01/13/2024 CLINICAL DATA:  Shortness of breath EXAM: PORTABLE CHEST 1 VIEW COMPARISON:  Chest x-ray 11/08/2021 FINDINGS: The heart size  and mediastinal contours are within normal limits. Both lungs are clear. The visualized skeletal structures are unremarkable. IMPRESSION: No active disease. Electronically Signed   By: Tyron Gallon M.D.   On: 01/13/2024 19:24   CT Head Wo Contrast Result Date: 01/13/2024 CLINICAL DATA:  Mental status change EXAM: CT HEAD WITHOUT CONTRAST TECHNIQUE: Contiguous axial images  were obtained from the base of the skull through the vertex without intravenous contrast. RADIATION DOSE REDUCTION: This exam was performed according to the departmental dose-optimization program which includes automated exposure control, adjustment of the mA and/or kV according to patient size and/or use of iterative reconstruction technique. COMPARISON:  Head CT 05/30/2019, MRI 10/16/2021 FINDINGS: Brain: No acute territorial infarction, hemorrhage or intracranial mass. Stable left frontal encephalomalacia in with overlying craniotomy. Mild ex vacuo dilatation of left anterolateral ventricle. Stable ventricle size and morphology. Similar periventricular white matter disease. Chronically enlarged sella turcica Vascular: No hyperdense vessels.  No unexpected calcification Skull: Left anterior craniotomy.  No fracture Sinuses/Orbits: No acute finding. Other: None IMPRESSION: 1. No CT evidence for acute intracranial abnormality. 2. Stable left frontal encephalomalacia with overlying craniotomy. 3. Stable chronic white matter disease Electronically Signed   By: Esmeralda Hedge M.D.   On: 01/13/2024 18:08    Procedures Procedures    Medications Ordered in ED Medications  lactated ringers  bolus 500 mL (0 mLs Intravenous Stopped 01/13/24 1934)    ED Course/ Medical Decision Making/ A&P                                 Medical Decision Making Amount and/or Complexity of Data Reviewed Labs: ordered. Radiology: ordered.    This patient presents to the ED for concern of generalized weakness, this involves an extensive number of  treatment options, and is a complaint that carries with it a high risk of complications and morbidity.  The differential diagnosis includes slight deficiency, renal failure, could be related to seizures Keppra  which can cause increasing sleepiness though a medication that she has been on for a while would be unlikely to cause that at this time.  Additionally she only takes 500 mg twice a day.  She does have thyroid  dysfunction and takes just a very small amount of Synthroid    Co morbidities that complicate the patient evaluation  Seizure disorder, hypertension   Additional history obtained:  Additional history obtained from medical record External records from outside source obtained and reviewed including office visits over time, last admission was 2 years ago for altered mental status   Lab Tests:  I Ordered, and personally interpreted labs.  The pertinent results include: CBC metabolic panel urinalysis magnesium  troponin and TSH evaluated, only abnormal finding of significance was a very slight anemia and a very slight renal insufficiency compared to baseline.   Imaging Studies ordered:  CT scan of the brain and chest x-ray, no acute findings on my personal rotation   Cardiac Monitoring: / EKG:  The patient was maintained on a cardiac monitor.  I personally viewed and interpreted the cardiac monitored which showed an underlying rhythm of: Normal sinus rhythm   Problem List / ED Course / Critical interventions / Medication management  Patient well-appearing, vital signs unremarkable except for mild hypertension I ordered medication including IV fluids for dehydration Reevaluation of the patient after these medicines showed that the patient improved I have reviewed the patients home medicines and have made adjustments as needed    Social Determinants of Health:  None   Test / Admission - Considered:  Admission the patient well-appearing and symptom-free at the time of  discharge         Final Clinical Impression(s) / ED Diagnoses Final diagnoses:  Dehydration    Rx / DC Orders ED Discharge Orders     None  Early Glisson, MD 01/13/24 Gene Kemps

## 2024-01-14 ENCOUNTER — Other Ambulatory Visit: Payer: Self-pay

## 2024-01-20 ENCOUNTER — Telehealth: Payer: Self-pay | Admitting: *Deleted

## 2024-01-20 DIAGNOSIS — R5383 Other fatigue: Secondary | ICD-10-CM | POA: Diagnosis not present

## 2024-01-20 DIAGNOSIS — Z8572 Personal history of non-Hodgkin lymphomas: Secondary | ICD-10-CM | POA: Diagnosis not present

## 2024-01-20 DIAGNOSIS — R569 Unspecified convulsions: Secondary | ICD-10-CM | POA: Diagnosis not present

## 2024-01-20 DIAGNOSIS — Z6826 Body mass index (BMI) 26.0-26.9, adult: Secondary | ICD-10-CM | POA: Diagnosis not present

## 2024-01-20 NOTE — Telephone Encounter (Signed)
 Patient called after hours line inquiring as to where her physician appointment is for today.  Appears that she has an 11 am appointment with PCP at [facility].  Both their office and myself attempted to reach out to patient, however were unsuccessful.  In addition, she was a previous patient of our office in 2023 and has not had follow up since.  Will reach out to provider to see what services she needs at this point and attempt to contact her to re-establish care.

## 2024-01-27 DIAGNOSIS — Z6830 Body mass index (BMI) 30.0-30.9, adult: Secondary | ICD-10-CM | POA: Diagnosis not present

## 2024-01-27 DIAGNOSIS — L89312 Pressure ulcer of right buttock, stage 2: Secondary | ICD-10-CM | POA: Diagnosis not present

## 2024-02-05 DIAGNOSIS — Z6830 Body mass index (BMI) 30.0-30.9, adult: Secondary | ICD-10-CM | POA: Diagnosis not present

## 2024-02-05 DIAGNOSIS — R2681 Unsteadiness on feet: Secondary | ICD-10-CM | POA: Diagnosis not present

## 2024-02-05 DIAGNOSIS — R531 Weakness: Secondary | ICD-10-CM | POA: Diagnosis not present

## 2024-02-19 DIAGNOSIS — R918 Other nonspecific abnormal finding of lung field: Secondary | ICD-10-CM | POA: Diagnosis not present

## 2024-02-19 DIAGNOSIS — I10 Essential (primary) hypertension: Secondary | ICD-10-CM | POA: Diagnosis not present

## 2024-02-19 DIAGNOSIS — R531 Weakness: Secondary | ICD-10-CM | POA: Diagnosis not present

## 2024-02-19 DIAGNOSIS — R079 Chest pain, unspecified: Secondary | ICD-10-CM | POA: Diagnosis not present

## 2024-02-19 DIAGNOSIS — N179 Acute kidney failure, unspecified: Secondary | ICD-10-CM | POA: Diagnosis not present

## 2024-02-19 DIAGNOSIS — J90 Pleural effusion, not elsewhere classified: Secondary | ICD-10-CM | POA: Diagnosis not present

## 2024-02-19 DIAGNOSIS — E860 Dehydration: Secondary | ICD-10-CM | POA: Diagnosis not present

## 2024-02-19 DIAGNOSIS — E785 Hyperlipidemia, unspecified: Secondary | ICD-10-CM | POA: Diagnosis not present

## 2024-02-20 DIAGNOSIS — I10 Essential (primary) hypertension: Secondary | ICD-10-CM | POA: Diagnosis not present

## 2024-02-20 DIAGNOSIS — C8590 Non-Hodgkin lymphoma, unspecified, unspecified site: Secondary | ICD-10-CM | POA: Diagnosis not present

## 2024-02-20 DIAGNOSIS — Z66 Do not resuscitate: Secondary | ICD-10-CM | POA: Diagnosis not present

## 2024-02-20 DIAGNOSIS — R52 Pain, unspecified: Secondary | ICD-10-CM | POA: Diagnosis not present

## 2024-02-20 DIAGNOSIS — R530 Neoplastic (malignant) related fatigue: Secondary | ICD-10-CM | POA: Diagnosis not present

## 2024-02-20 DIAGNOSIS — R918 Other nonspecific abnormal finding of lung field: Secondary | ICD-10-CM | POA: Diagnosis not present

## 2024-02-20 DIAGNOSIS — F419 Anxiety disorder, unspecified: Secondary | ICD-10-CM | POA: Diagnosis not present

## 2024-02-20 DIAGNOSIS — R110 Nausea: Secondary | ICD-10-CM | POA: Diagnosis not present

## 2024-02-20 DIAGNOSIS — E860 Dehydration: Secondary | ICD-10-CM | POA: Diagnosis not present

## 2024-02-20 DIAGNOSIS — N179 Acute kidney failure, unspecified: Secondary | ICD-10-CM | POA: Diagnosis not present

## 2024-02-20 DIAGNOSIS — J90 Pleural effusion, not elsewhere classified: Secondary | ICD-10-CM | POA: Diagnosis not present

## 2024-02-20 DIAGNOSIS — I82492 Acute embolism and thrombosis of other specified deep vein of left lower extremity: Secondary | ICD-10-CM | POA: Diagnosis not present

## 2024-02-20 DIAGNOSIS — I82442 Acute embolism and thrombosis of left tibial vein: Secondary | ICD-10-CM | POA: Diagnosis not present

## 2024-02-20 DIAGNOSIS — M7989 Other specified soft tissue disorders: Secondary | ICD-10-CM | POA: Diagnosis not present

## 2024-02-20 DIAGNOSIS — R0600 Dyspnea, unspecified: Secondary | ICD-10-CM | POA: Diagnosis not present

## 2024-02-20 DIAGNOSIS — G40909 Epilepsy, unspecified, not intractable, without status epilepticus: Secondary | ICD-10-CM | POA: Diagnosis not present

## 2024-02-20 DIAGNOSIS — Z515 Encounter for palliative care: Secondary | ICD-10-CM | POA: Diagnosis not present

## 2024-02-20 DIAGNOSIS — M109 Gout, unspecified: Secondary | ICD-10-CM | POA: Diagnosis not present

## 2024-02-20 DIAGNOSIS — R278 Other lack of coordination: Secondary | ICD-10-CM | POA: Diagnosis not present

## 2024-02-20 DIAGNOSIS — E785 Hyperlipidemia, unspecified: Secondary | ICD-10-CM | POA: Diagnosis not present

## 2024-02-20 DIAGNOSIS — D649 Anemia, unspecified: Secondary | ICD-10-CM | POA: Diagnosis not present

## 2024-02-20 DIAGNOSIS — Z7189 Other specified counseling: Secondary | ICD-10-CM | POA: Diagnosis not present

## 2024-02-20 DIAGNOSIS — F01A0 Vascular dementia, mild, without behavioral disturbance, psychotic disturbance, mood disturbance, and anxiety: Secondary | ICD-10-CM | POA: Diagnosis not present

## 2024-02-20 DIAGNOSIS — I82412 Acute embolism and thrombosis of left femoral vein: Secondary | ICD-10-CM | POA: Diagnosis not present

## 2024-02-20 DIAGNOSIS — R531 Weakness: Secondary | ICD-10-CM | POA: Diagnosis not present

## 2024-02-21 DIAGNOSIS — Z66 Do not resuscitate: Secondary | ICD-10-CM | POA: Diagnosis not present

## 2024-02-21 DIAGNOSIS — C8590 Non-Hodgkin lymphoma, unspecified, unspecified site: Secondary | ICD-10-CM | POA: Diagnosis not present

## 2024-02-21 DIAGNOSIS — Z515 Encounter for palliative care: Secondary | ICD-10-CM | POA: Diagnosis not present

## 2024-02-21 DIAGNOSIS — G40909 Epilepsy, unspecified, not intractable, without status epilepticus: Secondary | ICD-10-CM | POA: Diagnosis not present

## 2024-02-21 DIAGNOSIS — I82492 Acute embolism and thrombosis of other specified deep vein of left lower extremity: Secondary | ICD-10-CM | POA: Diagnosis not present

## 2024-02-21 DIAGNOSIS — I10 Essential (primary) hypertension: Secondary | ICD-10-CM | POA: Diagnosis not present

## 2024-02-21 DIAGNOSIS — F01A0 Vascular dementia, mild, without behavioral disturbance, psychotic disturbance, mood disturbance, and anxiety: Secondary | ICD-10-CM | POA: Diagnosis not present

## 2024-02-21 DIAGNOSIS — N179 Acute kidney failure, unspecified: Secondary | ICD-10-CM | POA: Diagnosis not present

## 2024-02-21 DIAGNOSIS — M7989 Other specified soft tissue disorders: Secondary | ICD-10-CM | POA: Diagnosis not present

## 2024-02-21 DIAGNOSIS — R52 Pain, unspecified: Secondary | ICD-10-CM | POA: Diagnosis not present

## 2024-02-22 DIAGNOSIS — Z66 Do not resuscitate: Secondary | ICD-10-CM | POA: Diagnosis not present

## 2024-02-22 DIAGNOSIS — C8590 Non-Hodgkin lymphoma, unspecified, unspecified site: Secondary | ICD-10-CM | POA: Diagnosis not present

## 2024-02-22 DIAGNOSIS — Z515 Encounter for palliative care: Secondary | ICD-10-CM | POA: Diagnosis not present

## 2024-02-22 DIAGNOSIS — I82492 Acute embolism and thrombosis of other specified deep vein of left lower extremity: Secondary | ICD-10-CM | POA: Diagnosis not present

## 2024-02-22 DIAGNOSIS — F01A0 Vascular dementia, mild, without behavioral disturbance, psychotic disturbance, mood disturbance, and anxiety: Secondary | ICD-10-CM | POA: Diagnosis not present

## 2024-02-22 DIAGNOSIS — N179 Acute kidney failure, unspecified: Secondary | ICD-10-CM | POA: Diagnosis not present

## 2024-02-22 DIAGNOSIS — R52 Pain, unspecified: Secondary | ICD-10-CM | POA: Diagnosis not present

## 2024-02-22 DIAGNOSIS — M7989 Other specified soft tissue disorders: Secondary | ICD-10-CM | POA: Diagnosis not present

## 2024-02-22 DIAGNOSIS — G40909 Epilepsy, unspecified, not intractable, without status epilepticus: Secondary | ICD-10-CM | POA: Diagnosis not present

## 2024-02-22 DIAGNOSIS — I10 Essential (primary) hypertension: Secondary | ICD-10-CM | POA: Diagnosis not present

## 2024-02-22 DIAGNOSIS — Z7189 Other specified counseling: Secondary | ICD-10-CM | POA: Diagnosis not present

## 2024-02-23 DIAGNOSIS — Z515 Encounter for palliative care: Secondary | ICD-10-CM | POA: Diagnosis not present

## 2024-02-23 DIAGNOSIS — G40909 Epilepsy, unspecified, not intractable, without status epilepticus: Secondary | ICD-10-CM | POA: Diagnosis not present

## 2024-02-23 DIAGNOSIS — N179 Acute kidney failure, unspecified: Secondary | ICD-10-CM | POA: Diagnosis not present

## 2024-02-23 DIAGNOSIS — Z66 Do not resuscitate: Secondary | ICD-10-CM | POA: Diagnosis not present

## 2024-02-23 DIAGNOSIS — I82492 Acute embolism and thrombosis of other specified deep vein of left lower extremity: Secondary | ICD-10-CM | POA: Diagnosis not present

## 2024-02-23 DIAGNOSIS — F01A0 Vascular dementia, mild, without behavioral disturbance, psychotic disturbance, mood disturbance, and anxiety: Secondary | ICD-10-CM | POA: Diagnosis not present

## 2024-02-23 DIAGNOSIS — R52 Pain, unspecified: Secondary | ICD-10-CM | POA: Diagnosis not present

## 2024-02-23 DIAGNOSIS — C8590 Non-Hodgkin lymphoma, unspecified, unspecified site: Secondary | ICD-10-CM | POA: Diagnosis not present

## 2024-03-25 DEATH — deceased
